# Patient Record
Sex: Male | Born: 1960 | Race: White | Hispanic: No | Smoking: Former smoker
Health system: Southern US, Community
[De-identification: ages and names within clinical notes are randomized; demographics above are authoritative.]

## PROBLEM LIST (undated history)

## (undated) DIAGNOSIS — J189 Pneumonia, unspecified organism: Secondary | ICD-10-CM

## (undated) DIAGNOSIS — I10 Essential (primary) hypertension: Secondary | ICD-10-CM

## (undated) DIAGNOSIS — M1990 Unspecified osteoarthritis, unspecified site: Secondary | ICD-10-CM

## (undated) DIAGNOSIS — J45909 Unspecified asthma, uncomplicated: Secondary | ICD-10-CM

## (undated) DIAGNOSIS — F32A Depression, unspecified: Secondary | ICD-10-CM

## (undated) DIAGNOSIS — C3491 Malignant neoplasm of unspecified part of right bronchus or lung: Secondary | ICD-10-CM

## (undated) DIAGNOSIS — M549 Dorsalgia, unspecified: Secondary | ICD-10-CM

## (undated) DIAGNOSIS — K219 Gastro-esophageal reflux disease without esophagitis: Secondary | ICD-10-CM

## (undated) DIAGNOSIS — I2699 Other pulmonary embolism without acute cor pulmonale: Secondary | ICD-10-CM

## (undated) DIAGNOSIS — Q0700 Arnold-Chiari syndrome without spina bifida or hydrocephalus: Secondary | ICD-10-CM

## (undated) DIAGNOSIS — R0600 Dyspnea, unspecified: Secondary | ICD-10-CM

## (undated) DIAGNOSIS — G8929 Other chronic pain: Secondary | ICD-10-CM

## (undated) DIAGNOSIS — M48061 Spinal stenosis, lumbar region without neurogenic claudication: Secondary | ICD-10-CM

## (undated) DIAGNOSIS — R062 Wheezing: Secondary | ICD-10-CM

## (undated) DIAGNOSIS — J302 Other seasonal allergic rhinitis: Secondary | ICD-10-CM

## (undated) DIAGNOSIS — F329 Major depressive disorder, single episode, unspecified: Secondary | ICD-10-CM

## (undated) DIAGNOSIS — J449 Chronic obstructive pulmonary disease, unspecified: Secondary | ICD-10-CM

## (undated) HISTORY — DX: Malignant neoplasm of unspecified part of right bronchus or lung: C34.91

## (undated) HISTORY — PX: BACK SURGERY: SHX140

---

## 2002-10-17 ENCOUNTER — Emergency Department (HOSPITAL_COMMUNITY): Admission: EM | Admit: 2002-10-17 | Discharge: 2002-10-17 | Payer: Self-pay | Admitting: Emergency Medicine

## 2004-04-29 ENCOUNTER — Emergency Department (HOSPITAL_COMMUNITY): Admission: EM | Admit: 2004-04-29 | Discharge: 2004-04-29 | Payer: Self-pay | Admitting: Emergency Medicine

## 2007-08-06 ENCOUNTER — Emergency Department (HOSPITAL_COMMUNITY): Admission: EM | Admit: 2007-08-06 | Discharge: 2007-08-06 | Payer: Self-pay | Admitting: Emergency Medicine

## 2008-07-26 ENCOUNTER — Emergency Department (HOSPITAL_COMMUNITY): Admission: EM | Admit: 2008-07-26 | Discharge: 2008-07-26 | Payer: Self-pay | Admitting: Emergency Medicine

## 2008-08-02 ENCOUNTER — Emergency Department (HOSPITAL_COMMUNITY): Admission: EM | Admit: 2008-08-02 | Discharge: 2008-08-02 | Payer: Self-pay | Admitting: Emergency Medicine

## 2008-09-02 ENCOUNTER — Ambulatory Visit: Payer: Self-pay | Admitting: Pain Medicine

## 2008-09-11 ENCOUNTER — Ambulatory Visit: Payer: Self-pay | Admitting: Pain Medicine

## 2008-09-12 ENCOUNTER — Other Ambulatory Visit: Payer: Self-pay

## 2008-09-12 ENCOUNTER — Ambulatory Visit: Payer: Self-pay | Admitting: Pain Medicine

## 2008-11-04 ENCOUNTER — Ambulatory Visit: Payer: Self-pay | Admitting: Pain Medicine

## 2008-11-19 ENCOUNTER — Ambulatory Visit: Payer: Self-pay | Admitting: Pain Medicine

## 2008-12-27 DIAGNOSIS — F411 Generalized anxiety disorder: Secondary | ICD-10-CM

## 2008-12-27 DIAGNOSIS — J45909 Unspecified asthma, uncomplicated: Secondary | ICD-10-CM | POA: Insufficient documentation

## 2008-12-27 DIAGNOSIS — M1990 Unspecified osteoarthritis, unspecified site: Secondary | ICD-10-CM | POA: Insufficient documentation

## 2008-12-27 DIAGNOSIS — M545 Low back pain: Secondary | ICD-10-CM

## 2008-12-27 DIAGNOSIS — F418 Other specified anxiety disorders: Secondary | ICD-10-CM

## 2008-12-30 ENCOUNTER — Ambulatory Visit: Payer: Self-pay | Admitting: Internal Medicine

## 2009-01-13 ENCOUNTER — Ambulatory Visit: Payer: Self-pay | Admitting: Internal Medicine

## 2009-01-13 DIAGNOSIS — L570 Actinic keratosis: Secondary | ICD-10-CM

## 2009-01-13 DIAGNOSIS — K219 Gastro-esophageal reflux disease without esophagitis: Secondary | ICD-10-CM

## 2009-02-24 ENCOUNTER — Ambulatory Visit: Payer: Self-pay | Admitting: Internal Medicine

## 2009-04-07 ENCOUNTER — Ambulatory Visit: Payer: Self-pay | Admitting: Internal Medicine

## 2009-04-08 ENCOUNTER — Encounter (INDEPENDENT_AMBULATORY_CARE_PROVIDER_SITE_OTHER): Payer: Self-pay | Admitting: Internal Medicine

## 2009-04-08 LAB — CONVERTED CEMR LAB
Amphetamine Screen, Ur: NEGATIVE
Barbiturate Quant, Ur: NEGATIVE
Creatinine,U: 123.7 mg/dL
Opiate Screen, Urine: NEGATIVE
Phencyclidine (PCP): NEGATIVE

## 2009-05-06 ENCOUNTER — Ambulatory Visit: Payer: Self-pay | Admitting: Internal Medicine

## 2009-05-07 ENCOUNTER — Encounter (INDEPENDENT_AMBULATORY_CARE_PROVIDER_SITE_OTHER): Payer: Self-pay | Admitting: Internal Medicine

## 2009-05-07 LAB — CONVERTED CEMR LAB
ALT: 19 units/L (ref 0–53)
AST: 17 units/L (ref 0–37)
Albumin: 4.3 g/dL (ref 3.5–5.2)
Alkaline Phosphatase: 89 units/L (ref 39–117)
Amphetamine Screen, Ur: NEGATIVE
BUN: 15 mg/dL (ref 6–23)
CO2: 23 meq/L (ref 19–32)
Calcium: 9.6 mg/dL (ref 8.4–10.5)
Chloride: 106 meq/L (ref 96–112)
Cholesterol: 152 mg/dL (ref 0–200)
Cocaine Metabolites: NEGATIVE
Creatinine, Ser: 0.81 mg/dL (ref 0.40–1.50)
Creatinine,U: 158.3 mg/dL
Glucose, Bld: 120 mg/dL — ABNORMAL HIGH (ref 70–99)
HDL: 68 mg/dL (ref >39)
LDL Cholesterol: 72 mg/dL (ref 0–99)
Marijuana Metabolite: NEGATIVE
Methadone: POSITIVE — AB
Opiates: NEGATIVE
Phencyclidine (PCP): NEGATIVE
Potassium: 4.6 meq/L (ref 3.5–5.3)
Sodium: 140 meq/L (ref 135–145)
Total Bilirubin: 0.3 mg/dL (ref 0.3–1.2)
Total CHOL/HDL Ratio: 2.2
Total Protein: 7.3 g/dL (ref 6.0–8.3)
Triglycerides: 62 mg/dL (ref <150)
VLDL: 12 mg/dL (ref 0–40)

## 2009-05-16 ENCOUNTER — Encounter (INDEPENDENT_AMBULATORY_CARE_PROVIDER_SITE_OTHER): Payer: Self-pay | Admitting: Internal Medicine

## 2009-05-19 ENCOUNTER — Encounter (INDEPENDENT_AMBULATORY_CARE_PROVIDER_SITE_OTHER): Payer: Self-pay | Admitting: Internal Medicine

## 2009-05-26 ENCOUNTER — Telehealth (INDEPENDENT_AMBULATORY_CARE_PROVIDER_SITE_OTHER): Payer: Self-pay | Admitting: Internal Medicine

## 2009-06-11 ENCOUNTER — Encounter (INDEPENDENT_AMBULATORY_CARE_PROVIDER_SITE_OTHER): Payer: Self-pay | Admitting: Internal Medicine

## 2010-05-28 ENCOUNTER — Emergency Department (HOSPITAL_COMMUNITY): Admission: EM | Admit: 2010-05-28 | Discharge: 2010-05-28 | Payer: Self-pay | Admitting: Emergency Medicine

## 2011-04-11 ENCOUNTER — Emergency Department (HOSPITAL_COMMUNITY)
Admission: EM | Admit: 2011-04-11 | Discharge: 2011-04-11 | Disposition: A | Discharge status: Home / Self Care | Payer: Self-pay | Attending: Emergency Medicine | Admitting: Emergency Medicine

## 2011-04-11 DIAGNOSIS — G8929 Other chronic pain: Secondary | ICD-10-CM | POA: Insufficient documentation

## 2011-04-11 DIAGNOSIS — M549 Dorsalgia, unspecified: Secondary | ICD-10-CM | POA: Insufficient documentation

## 2011-06-06 ENCOUNTER — Emergency Department (HOSPITAL_COMMUNITY)
Admission: EM | Admit: 2011-06-06 | Discharge: 2011-06-06 | Disposition: A | Discharge status: Home / Self Care | Payer: Self-pay | Attending: Emergency Medicine | Admitting: Emergency Medicine

## 2011-06-06 DIAGNOSIS — M545 Low back pain, unspecified: Secondary | ICD-10-CM | POA: Insufficient documentation

## 2011-06-06 DIAGNOSIS — Z79899 Other long term (current) drug therapy: Secondary | ICD-10-CM | POA: Insufficient documentation

## 2011-06-06 DIAGNOSIS — Z981 Arthrodesis status: Secondary | ICD-10-CM | POA: Insufficient documentation

## 2011-06-06 DIAGNOSIS — F1210 Cannabis abuse, uncomplicated: Secondary | ICD-10-CM | POA: Insufficient documentation

## 2011-06-06 DIAGNOSIS — F17200 Nicotine dependence, unspecified, uncomplicated: Secondary | ICD-10-CM | POA: Insufficient documentation

## 2011-06-06 DIAGNOSIS — G8929 Other chronic pain: Secondary | ICD-10-CM | POA: Insufficient documentation

## 2011-09-25 ENCOUNTER — Encounter: Payer: Self-pay | Admitting: *Deleted

## 2011-09-25 ENCOUNTER — Emergency Department (HOSPITAL_COMMUNITY)
Admission: EM | Admit: 2011-09-25 | Discharge: 2011-09-25 | Disposition: A | Discharge status: Home / Self Care | Payer: Self-pay | Attending: Emergency Medicine | Admitting: Emergency Medicine

## 2011-09-25 DIAGNOSIS — M5410 Radiculopathy, site unspecified: Secondary | ICD-10-CM

## 2011-09-25 DIAGNOSIS — M48061 Spinal stenosis, lumbar region without neurogenic claudication: Secondary | ICD-10-CM | POA: Insufficient documentation

## 2011-09-25 DIAGNOSIS — Z9889 Other specified postprocedural states: Secondary | ICD-10-CM | POA: Insufficient documentation

## 2011-09-25 DIAGNOSIS — M545 Low back pain, unspecified: Secondary | ICD-10-CM | POA: Insufficient documentation

## 2011-09-25 DIAGNOSIS — IMO0002 Reserved for concepts with insufficient information to code with codable children: Secondary | ICD-10-CM | POA: Insufficient documentation

## 2011-09-25 DIAGNOSIS — F17200 Nicotine dependence, unspecified, uncomplicated: Secondary | ICD-10-CM | POA: Insufficient documentation

## 2011-09-25 DIAGNOSIS — M549 Dorsalgia, unspecified: Secondary | ICD-10-CM

## 2011-09-25 HISTORY — DX: Spinal stenosis, lumbar region without neurogenic claudication: M48.061

## 2011-09-25 MED ORDER — HYDROMORPHONE HCL 2 MG/ML IJ SOLN
2.0000 mg | Freq: Once | INTRAMUSCULAR | Status: AC
  Administered 2011-09-25: 2 mg via INTRAMUSCULAR
  Filled 2011-09-25: qty 1

## 2011-09-25 MED ORDER — PREDNISONE 50 MG PO TABS: 50.0000 mg | ORAL_TABLET | Freq: Every day | ORAL | Status: AC

## 2011-09-25 MED ORDER — OXYCODONE-ACETAMINOPHEN 5-325 MG PO TABS
2.0000 | ORAL_TABLET | Freq: Four times a day (QID) | ORAL | Status: AC | PRN
Start: 2011-09-25 — End: 2011-10-05

## 2011-09-25 NOTE — ED Notes (Signed)
Pt c/o pain to bilateral legs and lower back; pt states he has fentnyl patch with no relief

## 2011-09-25 NOTE — ED Provider Notes (Signed)
History     CSN: [id number] Arrival date & time: 09/25/2011  5:48 PM   First MD Initiated Contact with Patient 09/25/11 1752      Chief Complaint  Patient presents with  . Pain    legs and lower back    (Consider location/radiation/quality/duration/timing/severity/associated sxs/prior treatment) HPI..... patient has chronic low back pain with radiation to both legs left greater than right. He's been doing this for 20 years. Multiple back surgeries in the past. Has been on a fentanyl patch this seemed to lose its potency after approximately 24 hours. Is able to see his doctor on Tuesday. Pain is sharp and is constant. Severe in intensity. No obvious change in character  Past Medical History  Diagnosis Date  . Spinal stenosis of lumbar region     Past Surgical History  Procedure Date  . Back surgery     History reviewed. No pertinent family history.  History  Substance Use Topics  . Smoking status: Current Everyday Smoker  . Smokeless tobacco: Not on file  . Alcohol Use: Yes     2-3 beers a week      Review of Systems  All other systems reviewed and are negative.    Allergies  Tetracyclines & related  Home Medications   Current Outpatient Rx  Name Route Sig Dispense Refill  . OXYCODONE-ACETAMINOPHEN 5-325 MG PO TABS Oral Take 2 tablets by mouth every 6 (six) hours as needed for pain. 20 tablet 0  . PREDNISONE 50 MG PO TABS Oral Take 1 tablet (50 mg total) by mouth daily. 7 tablet 1    BP 185/99  Pulse 102  Temp 98.7 F (37.1 C)  Resp 18  Ht 6' (1.829 m)  Wt 213 lb (96.616 kg)  BMI 28.89 kg/m2  SpO2 99%  Physical Exam  Nursing note and vitals reviewed. Constitutional: He is oriented to person, place, and time. He appears well-developed and well-nourished.       Hypertensive  HENT:  Head: Normocephalic and atraumatic.  Eyes: Conjunctivae and EOM are normal. Pupils are equal, round, and reactive to light.  Neck: Normal range of motion. Neck supple.    Cardiovascular: Normal rate and regular rhythm.   Pulmonary/Chest: Effort normal and breath sounds normal.  Abdominal: Soft. Bowel sounds are normal.  Musculoskeletal:       Generalized tenderness in lower back. Pain with straight leg raise left greater than right  Neurological: He is alert and oriented to person, place, and time.  Skin: Skin is warm and dry.  Psychiatric: He has a normal mood and affect.    ED Course  Procedures (including critical care time)  Labs Reviewed - No data to display No results found.   1. Back pain   2. Radicular leg pain       MDM  This appears to be chronic pain. No bowel or bladder incontinence. Will prescribe intramuscular opiate in ED. Discharge home with Percocet. See primary care doctor on Tuesday        Donnetta Hutching, MD 09/25/11 [phone]

## 2011-09-25 NOTE — ED Notes (Signed)
Pt reports has had long history of chronic lower back pain.  Reports has been on fentanyl patches but not helping.  States pain radiates down both legs.

## 2012-02-05 ENCOUNTER — Emergency Department (HOSPITAL_COMMUNITY)
Admission: EM | Admit: 2012-02-05 | Discharge: 2012-02-05 | Disposition: A | Discharge status: Home / Self Care | Payer: Self-pay | Attending: Emergency Medicine | Admitting: Emergency Medicine

## 2012-02-05 ENCOUNTER — Encounter (HOSPITAL_COMMUNITY): Payer: Self-pay | Admitting: *Deleted

## 2012-02-05 DIAGNOSIS — M79609 Pain in unspecified limb: Secondary | ICD-10-CM | POA: Insufficient documentation

## 2012-02-05 DIAGNOSIS — F17200 Nicotine dependence, unspecified, uncomplicated: Secondary | ICD-10-CM | POA: Insufficient documentation

## 2012-02-05 DIAGNOSIS — M545 Low back pain, unspecified: Secondary | ICD-10-CM | POA: Insufficient documentation

## 2012-02-05 DIAGNOSIS — G8929 Other chronic pain: Secondary | ICD-10-CM | POA: Insufficient documentation

## 2012-02-05 DIAGNOSIS — M549 Dorsalgia, unspecified: Secondary | ICD-10-CM

## 2012-02-05 HISTORY — DX: Dorsalgia, unspecified: M54.9

## 2012-02-05 HISTORY — DX: Other chronic pain: G89.29

## 2012-02-05 MED ORDER — OXYCODONE-ACETAMINOPHEN 5-325 MG PO TABS: 2.0000 | ORAL_TABLET | Freq: Four times a day (QID) | ORAL | Status: AC | PRN

## 2012-02-05 MED ORDER — HYDROMORPHONE HCL PF 2 MG/ML IJ SOLN
2.0000 mg | Freq: Once | INTRAMUSCULAR | Status: AC
  Administered 2012-02-05: 2 mg via INTRAMUSCULAR
  Filled 2012-02-05: qty 1

## 2012-02-05 MED ORDER — OXYCODONE-ACETAMINOPHEN 5-500 MG PO CAPS: 1.0000 | ORAL_CAPSULE | ORAL | Status: DC | PRN

## 2012-02-05 NOTE — ED Notes (Signed)
Pt states he has been without his Fentanyl patch x 3 days due to not being able to afford it. States he has an appt with PMD on Tuesday. States lower back pain radiating down both legs.

## 2012-02-05 NOTE — Discharge Instructions (Signed)
Follow up as planned next week °

## 2012-02-05 NOTE — ED Provider Notes (Signed)
History    Scribed for Benny Lennert, MD, the patient was seen in room APA08/APA08. This chart was scribed by Katha Cabal.  Pt was seen at 6:42 PM    CSN: [id number]  Arrival date & time 02/05/12  1734   First MD Initiated Contact with Patient 02/05/12 1840      Chief Complaint  Patient presents with  . Back Pain    (Consider location/radiation/quality/duration/timing/severity/associated sxs/prior treatment) Patient is a 51 y.o. male presenting with back pain.  Back Pain  This is a chronic problem. The current episode started more than 2 days ago. The problem occurs constantly. The problem has not changed since onset.Associated with: lack of Fentanyl patch. The pain is present in the lumbar spine. Radiates to: down bilateral legs. Pain severity now: moderate to severe  The pain is the same all the time. Associated symptoms include leg pain. Pertinent negatives include no chest pain, no headaches, no abdominal pain, no bowel incontinence and no bladder incontinence.   Patient ran out of Fentanyl pain medication 3 days ago.  Patient has history of spinal stenosis.  Patient has appt.early next week with PMD.  Patient uses Fentanyl patch.  Patient reports pain in bilateral legs and lower back pain.  Pain does not increase with movement.  Pain on top and sides of legs.   Patient states he is out of work and can't afford pain medication and is not in a pain clinic.     Past Medical History  Diagnosis Date  . Spinal stenosis of lumbar region   . Chronic back pain     Past Surgical History  Procedure Date  . Back surgery     No family history on file.  History  Substance Use Topics  . Smoking status: Current Everyday Smoker  . Smokeless tobacco: Not on file  . Alcohol Use: Yes     2-3 beers a week      Review of Systems  Constitutional: Negative for fatigue.  HENT: Negative for congestion, sinus pressure and ear discharge.   Eyes: Negative for discharge.  Respiratory:  Negative for cough.   Cardiovascular: Negative for chest pain.  Gastrointestinal: Negative for abdominal pain, diarrhea and bowel incontinence.  Genitourinary: Negative for bladder incontinence, frequency and hematuria.  Musculoskeletal: Positive for back pain.       Leg pain  Skin: Negative for rash.  Neurological: Negative for seizures and headaches.  Hematological: Negative.   Psychiatric/Behavioral: Negative for hallucinations.    Allergies  Tetracyclines & related  Home Medications   Current Outpatient Rx  Name Route Sig Dispense Refill  . FENTANYL 100 MCG/HR TD PT72 Transdermal Place 1 patch onto the skin 2 days. Patient states he took patch off 3 days ago      BP 147/112  Pulse 107  Temp(Src) 97.8 F (36.6 C) (Oral)  Resp 22  Ht 6' (1.829 m)  Wt 190 lb (86.183 kg)  BMI 25.77 kg/m2  SpO2 97%  Physical Exam  Constitutional: He is oriented to person, place, and time. He appears well-developed.  HENT:  Head: Normocephalic and atraumatic.  Eyes: Conjunctivae and EOM are normal. No scleral icterus.  Neck: Neck supple. No thyromegaly present.  Cardiovascular: Normal rate and regular rhythm.  Exam reveals no gallop and no friction rub.   No murmur heard. Pulmonary/Chest: No stridor. He has no wheezes. He has no rales. He exhibits no tenderness.  Abdominal: He exhibits no distension. There is no tenderness. There is no rebound.  Musculoskeletal: Normal range of motion. He exhibits no edema.       Lower lumbar spine tenderness, sensations of pain in bilateral legs but is able to move around  Lymphadenopathy:    He has no cervical adenopathy.  Neurological: He is oriented to person, place, and time. Coordination normal.  Skin: No rash noted. No erythema.  Psychiatric: He has a normal mood and affect. His behavior is normal.    ED Course  Procedures (including critical care time)   DIAGNOSTIC STUDIES: Oxygen Saturation is 97% on room air, normal by my  interpretation.     COORDINATION OF CARE: 6:45 PM  Physical exam complete.  Pain Control.  7:00 PM  Dilaudid ordered.   7:25 PM  Plan to discharge patient.  Patient agrees with plan.        LABS / RADIOLOGY:   Labs Reviewed - No data to display No results found.       MDM  Chronic back pain       MEDICATIONS GIVEN IN THE E.D. Scheduled Meds:    .  HYDROmorphone (DILAUDID) injection  2 mg Intramuscular Once   Continuous Infusions:      IMPRESSION: 1. Back pain      NEW MEDICATIONS: New Prescriptions   No medications on file      The chart was scribed for me under my direct supervision.  I personally performed the history, physical, and medical decision making and all procedures in the evaluation of this patient.Benny Lennert, MD 02/05/12 [phone]

## 2012-03-05 ENCOUNTER — Encounter (HOSPITAL_COMMUNITY): Payer: Self-pay | Admitting: *Deleted

## 2012-03-05 ENCOUNTER — Emergency Department (HOSPITAL_COMMUNITY)
Admission: EM | Admit: 2012-03-05 | Discharge: 2012-03-05 | Disposition: A | Discharge status: Home / Self Care | Payer: Self-pay | Attending: Emergency Medicine | Admitting: Emergency Medicine

## 2012-03-05 DIAGNOSIS — M5416 Radiculopathy, lumbar region: Secondary | ICD-10-CM

## 2012-03-05 DIAGNOSIS — Z888 Allergy status to other drugs, medicaments and biological substances status: Secondary | ICD-10-CM | POA: Insufficient documentation

## 2012-03-05 DIAGNOSIS — IMO0002 Reserved for concepts with insufficient information to code with codable children: Secondary | ICD-10-CM | POA: Insufficient documentation

## 2012-03-05 DIAGNOSIS — F17200 Nicotine dependence, unspecified, uncomplicated: Secondary | ICD-10-CM | POA: Insufficient documentation

## 2012-03-05 HISTORY — DX: Arnold-Chiari syndrome without spina bifida or hydrocephalus: Q07.00

## 2012-03-05 MED ORDER — PREDNISONE 20 MG PO TABS
60.0000 mg | ORAL_TABLET | Freq: Once | ORAL | Status: AC
  Administered 2012-03-05: 60 mg via ORAL
  Filled 2012-03-05: qty 3

## 2012-03-05 MED ORDER — PREDNISONE 10 MG PO TABS: ORAL_TABLET | ORAL | Status: DC

## 2012-03-05 MED ORDER — OXYCODONE-ACETAMINOPHEN 5-325 MG PO TABS: 1.0000 | ORAL_TABLET | ORAL | Status: AC | PRN

## 2012-03-05 MED ORDER — HYDROMORPHONE HCL PF 1 MG/ML IJ SOLN
1.0000 mg | Freq: Once | INTRAMUSCULAR | Status: AC
  Administered 2012-03-05: 1 mg via INTRAMUSCULAR
  Filled 2012-03-05: qty 1

## 2012-03-05 NOTE — Discharge Instructions (Signed)
Radicular Pain Radicular pain in either the arm or leg is usually from a bulging or herniated disk in the spine. A piece of the herniated disk may press against the nerves as the nerves exit the spine. This causes pain which is felt at the tips of the nerves down the arm or leg. Other causes of radicular pain may include:  Fractures.   Heart disease.   Cancer.   An abnormal and usually degenerative state of the nervous system or nerves (neuropathy).  Diagnosis may require CT or MRI scanning to determine the primary cause.  Nerves that start at the neck (nerve roots) may cause radicular pain in the outer shoulder and arm. It can spread down to the thumb and fingers. The symptoms vary depending on which nerve root has been affected. In most cases radicular pain improves with conservative treatment. Neck problems may require physical therapy, a neck collar, or cervical traction. Treatment may take many weeks, and surgery may be considered if the symptoms do not improve.  Conservative treatment is also recommended for sciatica. Sciatica causes pain to radiate from the lower back or buttock area down the leg into the foot. Often there is a history of back problems. Most patients with sciatica are better after 2 to 4 weeks of rest and other supportive care. Short term bed rest can reduce the disk pressure considerably. Sitting, however, is not a good position since this increases the pressure on the disk. You should avoid bending, lifting, and all other activities which make the problem worse. Traction can be used in severe cases. Surgery is usually reserved for patients who do not improve within the first months of treatment. Only take over-the-counter or prescription medicines for pain, discomfort, or fever as directed by your caregiver. Narcotics and muscle relaxants may help by relieving more severe pain and spasm and by providing mild sedation. Cold or massage can give significant relief. Spinal  manipulation is not recommended. It can increase the degree of disc protrusion. Epidural steroid injections are often effective treatment for radicular pain. These injections deliver medicine to the spinal nerve in the space between the protective covering of the spinal cord and back bones (vertebrae). Your caregiver can give you more information about steroid injections. These injections are most effective when given within two weeks of the onset of pain.  You should see your caregiver for follow up care as recommended. A program for neck and back injury rehabilitation with stretching and strengthening exercises is an important part of management.  SEEK IMMEDIATE MEDICAL CARE IF:  You develop increased pain, weakness, or numbness in your arm or leg.   You develop difficulty with bladder or bowel control.   You develop abdominal pain.  Document Released: 12/30/2004 Document Revised: 11/11/2011 Document Reviewed: 03/17/2009 [employer] Patient Information 2012 [employer]. 

## 2012-03-05 NOTE — ED Provider Notes (Signed)
History     CSN: [id number]  Arrival date & time 03/05/12  1004   First MD Initiated Contact with Patient 03/05/12 1023      Chief Complaint  Patient presents with  . Leg Pain    (Consider location/radiation/quality/duration/timing/severity/associated sxs/prior treatment) HPI Comments: Patient presents for pain management of his chronic low back pain.  He describes bilateral lower lumbar discomfort for which he  uses fentanyl patches prescribed by his PCP Dr. Megan Mans.  He has run out of these patches he also reports new tingling sensation in his right lateral lower thigh which has been present for the past week.  He denies any new injury, however he did have to help his mother off the floor last week when she took a fall.  Perhaps this flared up his chronic spinal stenosis.  He denies weakness in his lower extremities, no urinary or bowel incontinence or retention.  He is trying to get back in with chronic pain management, but states he has to get back on disability in order to make this happen.  Patient is a 51 y.o. male presenting with back pain. The history is provided by the patient.  Back Pain  This is a chronic problem. The pain is present in the lumbar spine. The quality of the pain is described as aching. The pain radiates to the right thigh. The pain is at a severity of 7/10. The pain is moderate. The symptoms are aggravated by bending, twisting and certain positions. The pain is the same all the time. Associated symptoms include leg pain and tingling. Pertinent negatives include no chest pain, no fever, no headaches, no abdominal pain, no abdominal swelling, no bowel incontinence, no perianal numbness, no bladder incontinence, no dysuria, no paresthesias, no paresis and no weakness.    Past Medical History  Diagnosis Date  . Spinal stenosis of lumbar region   . Chronic back pain   . Arnold-Chiari syndrome     Past Surgical History  Procedure Date  . Back surgery     No  family history on file.  History  Substance Use Topics  . Smoking status: Current Everyday Smoker  . Smokeless tobacco: Not on file  . Alcohol Use: Yes     2-3 beers a week      Review of Systems  Constitutional: Negative for fever.  HENT: Negative for congestion, sore throat and neck pain.   Eyes: Negative.   Respiratory: Negative for chest tightness and shortness of breath.   Cardiovascular: Negative for chest pain.  Gastrointestinal: Negative for nausea, abdominal pain and bowel incontinence.  Genitourinary: Negative.  Negative for bladder incontinence and dysuria.  Musculoskeletal: Positive for back pain. Negative for joint swelling and arthralgias.  Skin: Negative.  Negative for rash and wound.  Neurological: Positive for tingling. Negative for dizziness, weakness, light-headedness, headaches and paresthesias.  Hematological: Negative.   Psychiatric/Behavioral: Negative.     Allergies  Tetracyclines & related  Home Medications   Current Outpatient Rx  Name Route Sig Dispense Refill  . FENTANYL 100 MCG/HR TD PT72 Transdermal Place 1 patch onto the skin 2 days. Patient states he took patch off 3 days ago    . OXYCODONE-ACETAMINOPHEN 5-325 MG PO TABS Oral Take 1 tablet by mouth every 4 (four) hours as needed for pain. 30 tablet 0  . PREDNISONE 10 MG PO TABS  6, 5, 4, 3, 2 then 1 tablet by mouth daily for 6 days total. 21 tablet 0    BP 141/96  Pulse 103  Temp(Src) 98.1 F (36.7 C) (Oral)  Resp 18  Ht 6' (1.829 m)  Wt 215 lb (97.523 kg)  BMI 29.16 kg/m2  SpO2 98%  Physical Exam  Nursing note and vitals reviewed. Constitutional: He is oriented to person, place, and time. He appears well-developed and well-nourished. No distress.  HENT:  Head: Normocephalic and atraumatic.  Mouth/Throat: Oropharynx is clear and moist.  Eyes: Conjunctivae are normal.  Neck: Normal range of motion. Neck supple.  Cardiovascular: Normal rate and intact distal pulses.        Pedal  pulses normal.  Pulmonary/Chest: Effort normal.  Abdominal: Soft. He exhibits no distension.  Musculoskeletal: He exhibits tenderness. He exhibits no edema.       Lumbar back: He exhibits tenderness. He exhibits no swelling, no edema, no spasm and normal pulse.       Pain with straight leg raise right leg.  Lymphadenopathy:    He has no cervical adenopathy.  Neurological: He is alert and oriented to person, place, and time. He has normal strength. He displays no atrophy and no tremor. No cranial nerve deficit or sensory deficit. Gait normal.  Reflex Scores:      Patellar reflexes are 2+ on the right side and 2+ on the left side.      Achilles reflexes are 2+ on the right side and 2+ on the left side.      No strength deficit noted in hip and knee flexor and extensor muscle groups.  Ankle flexion and extension intact.  Skin: Skin is warm and dry. No erythema.  Psychiatric: He has a normal mood and affect.    ED Course  Procedures (including critical care time)  Labs Reviewed - No data to display No results found.   1. Lumbar radiculopathy       MDM  Acute on chronic low back pain with radiculopathy.  Patient has seen Dr. Danielle Dess in the past, performed his last 3 surgeries.  Encouraged followup with him.  Will put patient on prednisone taper and prescribe Percocet for short-term pain relief.  Also occurred just to follow up with his PCP for further pain management.        Candis Musa, PA 03/05/12 1050

## 2012-03-05 NOTE — ED Notes (Signed)
Pt a/ox4. Resp even and unlabored. NAD at this time. D/C instructions reviewed with pt. Pt verbalized understanding. Pt ambulated to lobby with steady gate.  

## 2012-03-05 NOTE — ED Notes (Signed)
Pt c/o pain and numbness in his right leg. States that he has chronic pain and that he is out of his Fentanyl patches. States that he is supposed to go to Dr. Renard Matter on Wednesday.

## 2012-03-05 NOTE — ED Notes (Signed)
bil leg pain since last night with numbness to right leg.  Reports takes fentanyl patches but ran out.

## 2012-03-06 ENCOUNTER — Emergency Department (HOSPITAL_COMMUNITY)
Admission: EM | Admit: 2012-03-06 | Discharge: 2012-03-06 | Disposition: A | Discharge status: Home / Self Care | Payer: Self-pay | Attending: Emergency Medicine | Admitting: Emergency Medicine

## 2012-03-06 ENCOUNTER — Encounter (HOSPITAL_COMMUNITY): Payer: Self-pay | Admitting: Emergency Medicine

## 2012-03-06 DIAGNOSIS — G8929 Other chronic pain: Secondary | ICD-10-CM | POA: Insufficient documentation

## 2012-03-06 DIAGNOSIS — M549 Dorsalgia, unspecified: Secondary | ICD-10-CM

## 2012-03-06 DIAGNOSIS — F17200 Nicotine dependence, unspecified, uncomplicated: Secondary | ICD-10-CM | POA: Insufficient documentation

## 2012-03-06 DIAGNOSIS — M48061 Spinal stenosis, lumbar region without neurogenic claudication: Secondary | ICD-10-CM | POA: Insufficient documentation

## 2012-03-06 DIAGNOSIS — Z883 Allergy status to other anti-infective agents status: Secondary | ICD-10-CM | POA: Insufficient documentation

## 2012-03-06 MED ORDER — HYDROMORPHONE HCL 2 MG PO TABS: 2.0000 mg | ORAL_TABLET | ORAL | Status: AC | PRN

## 2012-03-06 MED ORDER — HYDROMORPHONE HCL PF 2 MG/ML IJ SOLN
2.0000 mg | Freq: Once | INTRAMUSCULAR | Status: AC
  Administered 2012-03-06: 2 mg via INTRAMUSCULAR
  Filled 2012-03-06: qty 1

## 2012-03-06 NOTE — ED Notes (Signed)
Patient complaining of chronic right leg pain. States pain won't go away this time. Dr. Ignacia Palma just saw patient. Patient requesting pain medication.

## 2012-03-06 NOTE — Discharge Instructions (Signed)
Continue to take the prednisone prescribed yesterday.  Take Dilaudid 2 mg every 4 hours if needed for pain.  Call Dr. Renard Matter' office for followup.

## 2012-03-06 NOTE — ED Notes (Signed)
Pt c/o chronic r leg pain to anterior thigh and lateral lower leg. States has a lot of back problems. Could not get rid of the pain this time. Nad. Pt has been massaging leg. Nad. State has no feeling to anterior and lateral part of thigh. Pt was unable to feel my touch to anterior and lateral part of leg.

## 2012-03-06 NOTE — ED Provider Notes (Signed)
History     CSN: [id number]  Arrival date & time 03/06/12  1441   First MD Initiated Contact with Patient 03/06/12 1913      Chief Complaint  Patient presents with  . Leg Pain    (Consider location/radiation/quality/duration/timing/severity/associated sxs/prior treatment) Patient is a 51 y.o. male presenting with back pain. The history is provided by the patient and medical records. No language interpreter was used.  Back Pain  This is a chronic problem. The current episode started 2 days ago. The problem occurs constantly. The problem has been gradually worsening. Associated with: 3 prior back surgeries. He has chronic back pain, for which he is followed by Dr. Butch Penny. The pain is present in the lumbar spine. The quality of the pain is described as stabbing. The pain radiates to the right thigh. The pain is at a severity of 10/10. The pain is severe. The symptoms are aggravated by bending and twisting. Associated symptoms include leg pain. Pertinent negatives include no fever. Treatments tried: Percocet and prednisone taper, prescribed in [facility] ED yesterday. Risk factors: Prior back surgery.    Past Medical History  Diagnosis Date  . Spinal stenosis of lumbar region   . Chronic back pain   . Arnold-Chiari syndrome     Past Surgical History  Procedure Date  . Back surgery     History reviewed. No pertinent family history.  History  Substance Use Topics  . Smoking status: Current Everyday Smoker  . Smokeless tobacco: Not on file  . Alcohol Use: Yes     2-3 beers a week      Review of Systems  Constitutional: Negative.  Negative for fever and chills.  HENT: Negative.   Eyes: Negative.   Respiratory: Negative.   Cardiovascular: Negative.   Genitourinary: Negative.   Musculoskeletal: Positive for back pain.  Skin: Negative.   Psychiatric/Behavioral: Negative.     Allergies  Tetracyclines & related  Home Medications   Current Outpatient Rx  Name  Route Sig Dispense Refill  . FENTANYL 100 MCG/HR TD PT72 Transdermal Place 1 patch onto the skin 2 days. Patient states he took patch off 3 days ago    . OXYCODONE-ACETAMINOPHEN 5-325 MG PO TABS Oral Take 1 tablet by mouth every 4 (four) hours as needed for pain. 30 tablet 0  . PREDNISONE 10 MG PO TABS  6, 5, 4, 3, 2 then 1 tablet by mouth daily for 6 days total. 21 tablet 0  . SERTRALINE HCL 50 MG PO TABS Oral Take 50 mg by mouth daily.      BP 152/80  Pulse 83  Temp(Src) 98.2 F (36.8 C) (Oral)  Resp 20  Ht 6' (1.829 m)  Wt 215 lb (97.523 kg)  BMI 29.16 kg/m2  SpO2 96%  Physical Exam  Nursing note and vitals reviewed. Constitutional: He is oriented to person, place, and time. He appears well-developed and well-nourished.       In acute distress with low back pain.  HENT:  Head: Normocephalic and atraumatic.  Eyes: Conjunctivae and EOM are normal. Pupils are equal, round, and reactive to light.  Neck: Normal range of motion. Neck supple.  Cardiovascular: Normal rate and regular rhythm.   Pulmonary/Chest: Effort normal and breath sounds normal.  Abdominal: Soft.  Musculoskeletal:       Well healed lumbar surgical scars.  No bony deformity or point tenderness.  Neurological: He is alert and oriented to person, place, and time.  Localizes area of numbness to right thigh laterally.  No other numbness or weakness.   Skin: Skin is warm and dry.  Psychiatric: He has a normal mood and affect. His behavior is normal.    ED Course  Procedures (including critical care time)  7:40 PM Previously prescribed Percocet is not controlling his pain.  Rx with IM Dilaudid, Rx for Dilaudid 2 mg q4h prn pain.  Pt to followup with Dr. Renard Matter for his pain management, potential referrals for further evaluations.  1. Chronic back pain           Carleene Cooper III, MD 03/06/12 1944

## 2012-03-06 NOTE — ED Provider Notes (Signed)
Medical screening examination/treatment/procedure(s) were performed by non-physician practitioner and as supervising physician I was immediately available for consultation/collaboration.   Shelda Jakes, MD 03/06/12 1048

## 2012-03-16 ENCOUNTER — Other Ambulatory Visit (HOSPITAL_COMMUNITY): Payer: Self-pay | Admitting: Family Medicine

## 2012-03-17 ENCOUNTER — Other Ambulatory Visit (HOSPITAL_COMMUNITY): Payer: Self-pay | Admitting: Family Medicine

## 2012-03-17 DIAGNOSIS — M545 Low back pain: Secondary | ICD-10-CM

## 2012-03-21 ENCOUNTER — Ambulatory Visit (HOSPITAL_COMMUNITY)
Admission: RE | Admit: 2012-03-21 | Discharge: 2012-03-21 | Disposition: A | Discharge status: Home / Self Care | Payer: Self-pay | Source: Ambulatory Visit | Attending: Family Medicine | Admitting: Family Medicine

## 2012-03-21 DIAGNOSIS — M545 Low back pain, unspecified: Secondary | ICD-10-CM | POA: Insufficient documentation

## 2012-03-21 DIAGNOSIS — M79609 Pain in unspecified limb: Secondary | ICD-10-CM | POA: Insufficient documentation

## 2012-03-21 DIAGNOSIS — M5126 Other intervertebral disc displacement, lumbar region: Secondary | ICD-10-CM | POA: Insufficient documentation

## 2012-03-21 MED ORDER — GADOBENATE DIMEGLUMINE 529 MG/ML IV SOLN
20.0000 mL | Freq: Once | INTRAVENOUS | Status: AC | PRN
  Administered 2012-03-21: 20 mL via INTRAVENOUS

## 2012-04-05 ENCOUNTER — Encounter (HOSPITAL_COMMUNITY): Payer: Self-pay | Admitting: *Deleted

## 2012-04-05 ENCOUNTER — Emergency Department (HOSPITAL_COMMUNITY)
Admission: EM | Admit: 2012-04-05 | Discharge: 2012-04-05 | Disposition: A | Discharge status: Home / Self Care | Payer: Self-pay | Attending: Emergency Medicine | Admitting: Emergency Medicine

## 2012-04-05 DIAGNOSIS — M545 Low back pain, unspecified: Secondary | ICD-10-CM | POA: Insufficient documentation

## 2012-04-05 DIAGNOSIS — R209 Unspecified disturbances of skin sensation: Secondary | ICD-10-CM | POA: Insufficient documentation

## 2012-04-05 DIAGNOSIS — G8929 Other chronic pain: Secondary | ICD-10-CM | POA: Insufficient documentation

## 2012-04-05 MED ORDER — ONDANSETRON 8 MG PO TBDP
8.0000 mg | ORAL_TABLET | Freq: Once | ORAL | Status: AC
  Administered 2012-04-05: 8 mg via ORAL
  Filled 2012-04-05: qty 1

## 2012-04-05 MED ORDER — HYDROMORPHONE HCL PF 2 MG/ML IJ SOLN
2.0000 mg | Freq: Once | INTRAMUSCULAR | Status: AC
  Administered 2012-04-05: 2 mg via INTRAMUSCULAR
  Filled 2012-04-05: qty 1

## 2012-04-05 NOTE — ED Provider Notes (Signed)
History     CSN: [id number]  Arrival date & time 04/05/12  4098   First MD Initiated Contact with Patient 04/05/12 1801      Chief Complaint  Patient presents with  . Back Pain    (Consider location/radiation/quality/duration/timing/severity/associated sxs/prior treatment) HPI Comments: Patient c/o chronic pain to his lower back " for a long time".  He states that he gets pain patches from his pmd but cannot afford to get all that is prescribed at one time.  He also states that he has appt soon to see a neurosurgeon.  He c/o numbness and tingling sensation to his legs but states the sx's are chronic.  He denies incontinence of urine and feces, perineal numbness, or weakness  Patient is a 51 y.o. male presenting with back pain. The history is provided by the patient.  Back Pain  This is a chronic problem. The current episode started more than 1 week ago. The problem occurs constantly. The problem has not changed since onset.The pain is associated with no known injury. The pain is present in the lumbar spine. The pain radiates to the left thigh and right thigh. The pain is moderate. The symptoms are aggravated by bending, certain positions and twisting. The pain is the same all the time. Associated symptoms include numbness, leg pain, paresthesias and tingling. Pertinent negatives include no fever, no abdominal pain, no abdominal swelling, no bowel incontinence, no perianal numbness, no bladder incontinence, no dysuria, no pelvic pain and no weakness. Treatments tried: topical pain patches. The treatment provided no relief.    Past Medical History  Diagnosis Date  . Spinal stenosis of lumbar region   . Chronic back pain   . Arnold-Chiari syndrome     Past Surgical History  Procedure Date  . Back surgery     History reviewed. No pertinent family history.  History  Substance Use Topics  . Smoking status: Current Everyday Smoker  . Smokeless tobacco: Not on file  . Alcohol Use: Yes       2-3 beers a week      Review of Systems  Constitutional: Negative for fever.  Respiratory: Negative for shortness of breath.   Gastrointestinal: Negative for vomiting, abdominal pain, constipation and bowel incontinence.  Genitourinary: Negative for bladder incontinence, dysuria, hematuria, flank pain, decreased urine volume, difficulty urinating, testicular pain and pelvic pain.       Perineal numbness or incontinence of urine or feces  Musculoskeletal: Positive for back pain. Negative for joint swelling.  Skin: Negative for rash.  Neurological: Positive for tingling, numbness and paresthesias. Negative for weakness.  All other systems reviewed and are negative.    Allergies  Tetracyclines & related  Home Medications   Current Outpatient Rx  Name Route Sig Dispense Refill  . ALBUTEROL SULFATE HFA 108 (90 BASE) MCG/ACT IN AERS Inhalation Inhale 2 puffs into the lungs every 6 (six) hours as needed.    . FENTANYL 100 MCG/HR TD PT72 Transdermal Place 1 patch onto the skin 2 days. Patient states he took patch off 3 days ago    . HYDROCODONE-ACETAMINOPHEN 10-325 MG PO TABS Oral Take 1 tablet by mouth every 6 (six) hours as needed. For pain    . SERTRALINE HCL 100 MG PO TABS Oral Take 100 mg by mouth daily.      BP 148/105  Pulse 89  Temp(Src) 97.9 F (36.6 C) (Oral)  Resp 20  Ht 6' (1.829 m)  Wt 215 lb (97.523 kg)  BMI 29.16  kg/m2  SpO2 99%  Physical Exam  Nursing note and vitals reviewed. Constitutional: He is oriented to person, place, and time. He appears well-developed and well-nourished. No distress.  HENT:  Head: Normocephalic and atraumatic.  Mouth/Throat: Oropharynx is clear and moist.  Cardiovascular: Normal rate, regular rhythm, normal heart sounds and intact distal pulses.   Pulmonary/Chest: Effort normal and breath sounds normal. No respiratory distress. He exhibits no tenderness.  Abdominal: Soft. He exhibits no distension. There is no tenderness.   Musculoskeletal: He exhibits tenderness. He exhibits no edema.       Lumbar back: He exhibits decreased range of motion, tenderness and bony tenderness. He exhibits no swelling, no edema, no deformity, no laceration and normal pulse.  Neurological: He is alert and oriented to person, place, and time. No cranial nerve deficit or sensory deficit. He exhibits normal muscle tone. Coordination normal.  Reflex Scores:      Patellar reflexes are 2+ on the right side and 2+ on the left side.      Achilles reflexes are 2+ on the right side and 2+ on the left side. Skin: Skin is warm and dry.    ED Course  Procedures (including critical care time)   Pt had MRI of L spine on 03/17/12 showing:  1. L3-4 soft disc protrusion into the right lateral recess affecting the right L4 nerve.  2. L5-S1 right facet joint inflammation and hypertrophy affecting  the right L5 nerve.     MDM    Previous medical charts, nursing notes and vitals signs from this visit were reviewed by me   All laboratory results and/or imaging results performed on this visit, if applicable, were reviewed by me and discussed with the patient and/or parent as well as recommendation for follow-up    MEDICATIONS GIVEN IN ED:  IM dilaudid  Patient with chronic low back pain and multiple ED visits for same.  He has ttp of the lumbar spine and paraspinal muscles.  No focal neuro deficits on exam.  Ambulates with a cane.  I have reviewed the patient on the [place] narcotics database and he was prescribed fentanyl patches on 03/30/12 (10 day supply) plus several other narcotics during the month of April.  I have advised the pt that ER cannot manage chronic pain and that he will need to f/u with his PMD.  He verbalized understanding and agreed to the care plan     PRESCRIPTIONS GIVEN AT DISCHARGE:  none     Pt stable in ED with no significant deterioration in condition. Pt feels improved after observation and/or treatment in ED.  Patient / Family / Caregiver understand and agree with initial ED impression and plan with expectations set for ED visit.  Patient agrees to return to ED for any worsening symptoms        Mykel Mohl L. [place], [place] 04/09/12 2123

## 2012-04-05 NOTE — Discharge Instructions (Signed)
Chronic Back Pain When back pain lasts longer than 3 months, it is called chronic back pain.This pain can be frustrating, but the cause of the pain is rarely dangerous.People with chronic back pain often go through certain periods that are more intense (flare-ups). CAUSES Chronic back pain can be caused by wear and tear (degeneration) on different structures in your back. These structures may include bones, ligaments, or discs. This degeneration may result in more pressure being placed on the nerves that travel to your legs and feet. This can lead to pain traveling from the low back down the back of the legs. When pain lasts longer than 3 months, it is not unusual for people to experience anxiety or depression. Anxiety and depression can also contribute to low back pain. TREATMENT  Establish a regular exercise plan. This is critical to improving your functional level.   Have a self-management plan for when you flare-up. Flare-ups rarely require a medical visit. Regular exercise will help reduce the intensity and frequency of your flare-ups.   Manage how you feel about your back pain and the rest of your life. Anxiety, depression, and feeling that you cannot alter your back pain have been shown to make back pain more intense and debilitating.   Medicines should never be your only treatment. They should be used along with other treatments to help you return to a more active lifestyle.   Procedures such as injections or surgery may be helpful but are rarely necessary. You may be able to get the same results with physical therapy or chiropractic care.  HOME CARE INSTRUCTIONS  Avoid bending, heavy lifting, prolonged sitting, and activities which make the problem worse.   Continue normal activity as much as possible.   Take brief periods of rest throughout the day to reduce your pain during flare-ups.   Follow your back exercise rehabilitation program. This can help reduce symptoms and prevent  more pain.   Only take over-the-counter or prescription medicines as directed by your caregiver. Muscle relaxants are sometimes prescribed. Narcotic pain medicine is discouraged for long-term pain, since addiction is a possible outcome.   If you smoke, quit.   Eat healthy foods and maintain a recommended body weight.  SEEK IMMEDIATE MEDICAL CARE IF:   You have weakness or numbness in one of your legs or feet.   You have trouble controlling your bladder or bowels.   You develop nausea, vomiting, abdominal pain, shortness of breath, or fainting.  Document Released: 12/30/2004 Document Revised: 11/11/2011 Document Reviewed: 11/06/2011 ExitCare Patient Information 2012 [employer].Chronic Pain Management Managing chronic pain is not easy. The goal is to provide as much pain relief as possible. There are emotional as well as physical problems. Chronic pain may lead to symptoms of depression which magnify those of the pain. Problems may include:  Anxiety.   Sleep disturbances.   Confused thinking.   Feeling cranky.   Fatigue.   Weight gain or loss.  Identify the source of the pain first, if possible. The pain may be masking another problem. Try to find a pain management specialist or clinic. Work with a team to create a treatment plan for you. MEDICATIONS  May include narcotics or opioids. Larger than normal doses may be needed to control your pain.   Drugs for depression may help.   Over-the-counter medicines may help for some conditions. These drugs may be used along with others for better pain relief.   May be injected into sites such as the spine   and joints. Injections may have to be repeated if they wear off.  THERAPY MAY INCLUDE:  Working with a physical therapist to keep from getting stiff.   Regular, gentle exercise.   Cognitive or behavioral therapy.   Using complementary or integrative medicine such as:   Acupuncture.   Massage, Reiki, or Rolfing.    Aroma, color, light, or sound therapy.   Group support.  FOR MORE INFORMATION http://www.painfoundation.org. American Chronic Pain Association http://www.thealpa.org. Document Released: 12/30/2004 Document Revised: 11/11/2011 Document Reviewed: 02/08/2008 ExitCare Patient Information 2012 [employer]. 

## 2012-04-05 NOTE — ED Notes (Signed)
Bil leg pain , numbness, tingling.  Has appt with Dr Channing Mutters soon.

## 2012-04-09 NOTE — ED Provider Notes (Signed)
Medical screening examination/treatment/procedure(s) were performed by non-physician practitioner and as supervising physician I was immediately available for consultation/collaboration.   Saleh Ulbrich L Chrstopher Malenfant, MD 04/09/12 2200 

## 2012-09-27 ENCOUNTER — Encounter (HOSPITAL_COMMUNITY): Payer: Self-pay | Admitting: *Deleted

## 2012-09-27 ENCOUNTER — Emergency Department (HOSPITAL_COMMUNITY)
Admission: EM | Admit: 2012-09-27 | Discharge: 2012-09-27 | Disposition: A | Discharge status: Home / Self Care | Payer: Self-pay | Attending: Emergency Medicine | Admitting: Emergency Medicine

## 2012-09-27 DIAGNOSIS — M129 Arthropathy, unspecified: Secondary | ICD-10-CM | POA: Insufficient documentation

## 2012-09-27 DIAGNOSIS — M48061 Spinal stenosis, lumbar region without neurogenic claudication: Secondary | ICD-10-CM | POA: Insufficient documentation

## 2012-09-27 DIAGNOSIS — Z8669 Personal history of other diseases of the nervous system and sense organs: Secondary | ICD-10-CM | POA: Insufficient documentation

## 2012-09-27 DIAGNOSIS — G8929 Other chronic pain: Secondary | ICD-10-CM | POA: Insufficient documentation

## 2012-09-27 DIAGNOSIS — M5416 Radiculopathy, lumbar region: Secondary | ICD-10-CM

## 2012-09-27 DIAGNOSIS — IMO0002 Reserved for concepts with insufficient information to code with codable children: Secondary | ICD-10-CM | POA: Insufficient documentation

## 2012-09-27 DIAGNOSIS — F17200 Nicotine dependence, unspecified, uncomplicated: Secondary | ICD-10-CM | POA: Insufficient documentation

## 2012-09-27 DIAGNOSIS — Z79899 Other long term (current) drug therapy: Secondary | ICD-10-CM | POA: Insufficient documentation

## 2012-09-27 HISTORY — DX: Unspecified osteoarthritis, unspecified site: M19.90

## 2012-09-27 MED ORDER — OXYCODONE-ACETAMINOPHEN 10-325 MG PO TABS: 1.0000 | ORAL_TABLET | ORAL | Status: DC | PRN

## 2012-09-27 MED ORDER — HYDROMORPHONE HCL PF 2 MG/ML IJ SOLN
2.0000 mg | Freq: Once | INTRAMUSCULAR | Status: AC
  Administered 2012-09-27: 2 mg via INTRAMUSCULAR
  Filled 2012-09-27: qty 1

## 2012-09-27 NOTE — ED Provider Notes (Signed)
Medical screening examination/treatment/procedure(s) were performed by non-physician practitioner and as supervising physician I was immediately available for consultation/collaboration.   Laray Anger, DO 09/27/12 2028

## 2012-09-27 NOTE — ED Provider Notes (Signed)
History     CSN: [id number]  Arrival date & time 09/27/12  1218   First MD Initiated Contact with Patient 09/27/12 1229      Chief Complaint  Patient presents with  . Back Pain    (Consider location/radiation/quality/duration/timing/severity/associated sxs/prior treatment) HPI Comments: Erik [name]   presents with acute on chronic low back pain which has which has been present for the past 2 days.   Patient denies any new injury specifically.  There is radiation into his right  lower thigh.  There has been no new weakness or numbness in the lower extremities, but does have chronic right lateral thigh numbness, and no urinary or bowel retention or incontinence.  Patient does not have a history of cancer or IVDU.  He does have known degenerative disk and joint disease in his lower and upper back.  He is being evaluated by Dr Channing Mutters currently and may require repeat lumbar surgery.  He has run out of his fentanyl patches,  Hence the increased pain today. He denies any new symptoms.   The history is provided by the patient.    Past Medical History  Diagnosis Date  . Spinal stenosis of lumbar region   . Chronic back pain   . Arnold-Chiari syndrome   . Arthritis     Past Surgical History  Procedure Date  . Back surgery     History reviewed. No pertinent family history.  History  Substance Use Topics  . Smoking status: Current Every Day Smoker  . Smokeless tobacco: Not on file  . Alcohol Use: Yes     2-3 beers a week      Review of Systems  Constitutional: Negative for fever.  Respiratory: Negative for shortness of breath.   Cardiovascular: Negative for chest pain and leg swelling.  Gastrointestinal: Negative for abdominal pain, constipation and abdominal distention.  Genitourinary: Negative for dysuria, urgency, frequency, flank pain and difficulty urinating.  Musculoskeletal: Positive for back pain. Negative for joint swelling and gait problem.  Skin: Negative for  rash.  Neurological: Negative for weakness and numbness.    Allergies  Tetracyclines & related  Home Medications   Current Outpatient Rx  Name Route Sig Dispense Refill  . ALBUTEROL SULFATE HFA 108 (90 BASE) MCG/ACT IN AERS Inhalation Inhale 2 puffs into the lungs every 6 (six) hours as needed.    . FENTANYL 100 MCG/HR TD PT72 Transdermal Place 1 patch onto the skin 2 days. Patient states he took patch off 3 days ago    . HYDROCODONE-ACETAMINOPHEN 10-325 MG PO TABS Oral Take 1 tablet by mouth every 6 (six) hours as needed. For pain    . OXYCODONE-ACETAMINOPHEN 10-325 MG PO TABS Oral Take 1 tablet by mouth every 4 (four) hours as needed for pain. 30 tablet 0  . SERTRALINE HCL 100 MG PO TABS Oral Take 100 mg by mouth daily.      BP 139/94  Pulse 102  Temp 98.5 F (36.9 C) (Oral)  Resp 18  Ht 6' (1.829 m)  Wt 230 lb (104.327 kg)  BMI 31.19 kg/m2  SpO2 97%  Physical Exam  Nursing note and vitals reviewed. Constitutional: He appears well-developed and well-nourished.  HENT:  Head: Normocephalic.  Eyes: Conjunctivae normal are normal.  Neck: Normal range of motion. Neck supple.  Cardiovascular: Normal rate and intact distal pulses.        Pedal pulses normal.  Pulmonary/Chest: Effort normal.  Abdominal: Soft. Bowel sounds are normal. He exhibits no distension  and no mass.  Musculoskeletal: Normal range of motion. He exhibits no edema.       Lumbar back: He exhibits tenderness. He exhibits no swelling, no edema and no spasm.  Neurological: He is alert. He has normal strength. He displays no atrophy and no tremor. No sensory deficit. Gait normal.  Reflex Scores:      Patellar reflexes are 2+ on the right side and 2+ on the left side.      Achilles reflexes are 2+ on the right side and 2+ on the left side.      4/5 strength right hip flexors (baseline per pt)  Ankle flexion and extension intact.  Skin: Skin is warm and dry.  Psychiatric: He has a normal mood and affect.    ED  Course  Procedures (including critical care time)  Labs Reviewed - No data to display No results found.   1. Lumbar radicular pain   2. Chronic back pain    Dilaudid 2 mg IM given   MDM  Acute on chronic low back pain with no new neuro deficits or injury.  Pt encouraged f/u with pcp and/or Dr Channing Mutters.  Prescribed percocet.  The patient appears reasonably screened and/or stabilized for discharge and I doubt any other medical condition or other [facility] requiring further screening, evaluation, or treatment in the ED at this time prior to discharge.         Burgess Amor, PA 09/27/12 1249

## 2012-09-27 NOTE — ED Notes (Signed)
Pt presents with chronic back pain excerebration that began 2 days ago. Pt states has rx for fentanyl patch but cannot afford them until Monday.   Pt states pain has intensified over past day. NAD noted. Pt denies bowel and bladder incontinence.

## 2012-09-27 NOTE — ED Notes (Signed)
Low back pain and rt leg pain, chronic.  Out of meds

## 2012-10-21 ENCOUNTER — Encounter (HOSPITAL_COMMUNITY): Payer: Self-pay | Admitting: Emergency Medicine

## 2012-10-21 ENCOUNTER — Emergency Department (HOSPITAL_COMMUNITY)
Admission: EM | Admit: 2012-10-21 | Discharge: 2012-10-21 | Disposition: A | Discharge status: Home / Self Care | Payer: Self-pay | Attending: Emergency Medicine | Admitting: Emergency Medicine

## 2012-10-21 DIAGNOSIS — Z79899 Other long term (current) drug therapy: Secondary | ICD-10-CM | POA: Insufficient documentation

## 2012-10-21 DIAGNOSIS — G8929 Other chronic pain: Secondary | ICD-10-CM | POA: Insufficient documentation

## 2012-10-21 DIAGNOSIS — M545 Low back pain: Secondary | ICD-10-CM

## 2012-10-21 DIAGNOSIS — IMO0002 Reserved for concepts with insufficient information to code with codable children: Secondary | ICD-10-CM | POA: Insufficient documentation

## 2012-10-21 DIAGNOSIS — G935 Compression of brain: Secondary | ICD-10-CM | POA: Insufficient documentation

## 2012-10-21 DIAGNOSIS — Z760 Encounter for issue of repeat prescription: Secondary | ICD-10-CM | POA: Insufficient documentation

## 2012-10-21 DIAGNOSIS — Z9889 Other specified postprocedural states: Secondary | ICD-10-CM | POA: Insufficient documentation

## 2012-10-21 DIAGNOSIS — F17200 Nicotine dependence, unspecified, uncomplicated: Secondary | ICD-10-CM | POA: Insufficient documentation

## 2012-10-21 DIAGNOSIS — M5416 Radiculopathy, lumbar region: Secondary | ICD-10-CM

## 2012-10-21 DIAGNOSIS — Z8739 Personal history of other diseases of the musculoskeletal system and connective tissue: Secondary | ICD-10-CM | POA: Insufficient documentation

## 2012-10-21 MED ORDER — CYCLOBENZAPRINE HCL 10 MG PO TABS: 10.0000 mg | ORAL_TABLET | Freq: Three times a day (TID) | ORAL | Status: DC | PRN

## 2012-10-21 MED ORDER — HYDROMORPHONE HCL PF 2 MG/ML IJ SOLN
2.0000 mg | Freq: Once | INTRAMUSCULAR | Status: AC
  Administered 2012-10-21: 2 mg via INTRAMUSCULAR
  Filled 2012-10-21: qty 1

## 2012-10-21 MED ORDER — HYDROCODONE-ACETAMINOPHEN 10-325 MG PO TABS: 1.0000 | ORAL_TABLET | Freq: Four times a day (QID) | ORAL | Status: DC | PRN

## 2012-10-21 NOTE — ED Provider Notes (Signed)
History     CSN: [id number]  Arrival date & time 10/21/12  1029   First MD Initiated Contact with Patient 10/21/12 1113      Chief Complaint  Patient presents with  . Back Pain    (Consider location/radiation/quality/duration/timing/severity/associated sxs/prior treatment) HPI Comments: Patient with hx of chronic low back pain c/o  Worsening pain for several days after running out of his hydrocodone and fentanyl patches.  States that he has hx of previous lumbar fusion and is waiting to have another surgery to his lumbar spine.  States his PMD, Dr. Renard Matter as been treating his pain, but recently he has not had the finances to get his pain medication filled.  Pain is similar to previous.  He denies numbness or weakness of the extremity, incontinence, dysuria, or abdominal pain.    Patient is a 51 y.o. male presenting with back pain. The history is provided by the patient.  Back Pain  This is a chronic problem. The current episode started more than 2 days ago. The problem occurs constantly. The problem has not changed since onset.The pain is associated with no known injury. The pain is present in the lumbar spine. The quality of the pain is described as aching. The pain does not radiate. The symptoms are aggravated by bending, certain positions and twisting. Pertinent negatives include no chest pain, no fever, no numbness, no abdominal pain, no abdominal swelling, no bowel incontinence, no perianal numbness, no bladder incontinence, no dysuria, no pelvic pain, no leg pain, no paresthesias, no paresis, no tingling and no weakness. He has tried analgesics for the symptoms. The treatment provided mild relief.    Past Medical History  Diagnosis Date  . Spinal stenosis of lumbar region   . Chronic back pain   . Arnold-Chiari syndrome   . Arthritis     Past Surgical History  Procedure Date  . Back surgery     No family history on file.  History  Substance Use Topics  . Smoking status:  Current Every Day Smoker  . Smokeless tobacco: Not on file  . Alcohol Use: Yes     Comment: 2-3 beers a week      Review of Systems  Constitutional: Negative for fever.  Respiratory: Negative for shortness of breath.   Cardiovascular: Negative for chest pain.  Gastrointestinal: Negative for vomiting, abdominal pain, constipation and bowel incontinence.  Genitourinary: Negative for bladder incontinence, dysuria, hematuria, flank pain, decreased urine volume, difficulty urinating and pelvic pain.       No perineal numbness or incontinence of urine or feces  Musculoskeletal: Positive for back pain. Negative for joint swelling.  Skin: Negative for rash.  Neurological: Negative for tingling, weakness, numbness and paresthesias.  All other systems reviewed and are negative.    Allergies  Tetracyclines & related  Home Medications   Current Outpatient Rx  Name  Route  Sig  Dispense  Refill  . ALBUTEROL SULFATE HFA 108 (90 BASE) MCG/ACT IN AERS   Inhalation   Inhale 2 puffs into the lungs every 6 (six) hours as needed.         . FENTANYL 100 MCG/HR TD PT72   Transdermal   Place 1 patch onto the skin 2 days. Patient states he took patch off 3 days ago         . HYDROCODONE-ACETAMINOPHEN 10-325 MG PO TABS   Oral   Take 1 tablet by mouth every 6 (six) hours as needed. For pain         .  OXYCODONE-ACETAMINOPHEN 10-325 MG PO TABS   Oral   Take 1 tablet by mouth every 4 (four) hours as needed for pain.   30 tablet   0   . SERTRALINE HCL 100 MG PO TABS   Oral   Take 100 mg by mouth daily.           BP 161/91  Pulse 82  Temp 97.7 F (36.5 C) (Oral)  Resp 18  Ht 6' (1.829 m)  Wt 230 lb (104.327 kg)  BMI 31.19 kg/m2  SpO2 98%  Physical Exam  Nursing note and vitals reviewed. Constitutional: He is oriented to person, place, and time. He appears well-developed and well-nourished. No distress.  HENT:  Head: Normocephalic and atraumatic.  Neck: Normal range of  motion. Neck supple.  Cardiovascular: Normal rate, regular rhythm and intact distal pulses.   No murmur heard. Pulmonary/Chest: Effort normal and breath sounds normal.  Musculoskeletal: He exhibits tenderness. He exhibits no edema.       Lumbar back: He exhibits tenderness, bony tenderness and pain. He exhibits normal range of motion, no swelling, no deformity, no laceration and normal pulse.       Back:       ttp of the lumbar spine and paraspinal muscles.    Neurological: He is alert and oriented to person, place, and time. No cranial nerve deficit or sensory deficit. He exhibits normal muscle tone. Coordination and gait normal.  Reflex Scores:      Patellar reflexes are 2+ on the right side and 2+ on the left side.      Achilles reflexes are 2+ on the right side and 2+ on the left side. Skin: Skin is warm and dry.    ED Course  Procedures (including critical care time)  Labs Reviewed - No data to display No results found.      MDM   Previous ED chart reviewed.  Pt was advised that he will need further managment of his chronic back pain with his PMD or his neurosurgeon.  Pt verbalized understanding and agrees to care plan.    Doubt any emergent neurological or infectious process.  Patient has ttp of the lumbar spine and paraspinal muscles.  No focal neuro deficits on exam.  Ambulates with a cane but has a steady gait.     Prescribed: norco #15 flexeril   Sharonna Vinje L. [place], [place] 10/23/12 1653

## 2012-10-21 NOTE — ED Notes (Signed)
Pt c/o chronic back pain and leg pain. Pt states he is out of his pain patches and hydrocodone.

## 2012-10-21 NOTE — ED Notes (Signed)
Pt states his sister is on the way to get him and take him home.

## 2012-10-22 ENCOUNTER — Emergency Department (HOSPITAL_COMMUNITY)
Admission: EM | Admit: 2012-10-22 | Discharge: 2012-10-22 | Disposition: A | Discharge status: Home / Self Care | Payer: Self-pay | Attending: Emergency Medicine | Admitting: Emergency Medicine

## 2012-10-22 ENCOUNTER — Encounter (HOSPITAL_COMMUNITY): Payer: Self-pay | Admitting: Emergency Medicine

## 2012-10-22 DIAGNOSIS — Z79899 Other long term (current) drug therapy: Secondary | ICD-10-CM | POA: Insufficient documentation

## 2012-10-22 DIAGNOSIS — F17200 Nicotine dependence, unspecified, uncomplicated: Secondary | ICD-10-CM | POA: Insufficient documentation

## 2012-10-22 DIAGNOSIS — M545 Low back pain, unspecified: Secondary | ICD-10-CM | POA: Insufficient documentation

## 2012-10-22 DIAGNOSIS — M48061 Spinal stenosis, lumbar region without neurogenic claudication: Secondary | ICD-10-CM | POA: Insufficient documentation

## 2012-10-22 DIAGNOSIS — G8929 Other chronic pain: Secondary | ICD-10-CM | POA: Insufficient documentation

## 2012-10-22 DIAGNOSIS — Q054 Unspecified spina bifida with hydrocephalus: Secondary | ICD-10-CM | POA: Insufficient documentation

## 2012-10-22 DIAGNOSIS — Z8739 Personal history of other diseases of the musculoskeletal system and connective tissue: Secondary | ICD-10-CM | POA: Insufficient documentation

## 2012-10-22 MED ORDER — HYDROMORPHONE HCL PF 1 MG/ML IJ SOLN
2.0000 mg | Freq: Once | INTRAMUSCULAR | Status: AC
  Administered 2012-10-22: 2 mg via INTRAMUSCULAR
  Filled 2012-10-22: qty 2

## 2012-10-22 MED ORDER — OXYCODONE-ACETAMINOPHEN 5-325 MG PO TABS: 1.0000 | ORAL_TABLET | ORAL | Status: AC | PRN

## 2012-10-22 MED ORDER — HYDROMORPHONE HCL PF 1 MG/ML IJ SOLN: 1.0000 mg | Freq: Once | INTRAMUSCULAR | Status: DC

## 2012-10-22 NOTE — ED Provider Notes (Signed)
Medical screening examination/treatment/procedure(s) were performed by non-physician practitioner and as supervising physician I was immediately available for consultation/collaboration.   Elizbeth Posa L Dhanvin Szeto, MD 10/22/12 2317 

## 2012-10-22 NOTE — ED Notes (Signed)
Pt stated he "have some hydrocodones left, but they are not helping my pain"

## 2012-10-22 NOTE — ED Notes (Signed)
Pt c/o back/leg pain, pt was seen for the same yesterday.

## 2012-10-22 NOTE — ED Notes (Signed)
Pt reports low back pain, was seen in er for same yesterday. Reports cont. pain

## 2012-10-22 NOTE — ED Provider Notes (Signed)
History     CSN: [id number]  Arrival date & time 10/22/12  1714   First MD Initiated Contact with Patient 10/22/12 1841      Chief Complaint  Patient presents with  . Back Pain  . Leg Pain    (Consider location/radiation/quality/duration/timing/severity/associated sxs/prior treatment) HPI Comments: Erik [name]  presents with acute on chronic low back pain which has which has been worsened since he ran out of his duragesic patch.  He is under the care of Dr. Channing Mutters and anticipating a lumbar fusion in the near future.   Patient denies any new injury specifically.  There is radiation into the right lower extremity which is chronic and not worsened along with numbness in the right leg which is also chronic.  He denies urinary or bowel retention or incontinence.  Patient does not have a history of cancer or IVDU.   Patient is a 51 y.o. male presenting with leg pain. The history is provided by the patient.  Leg Pain  Pertinent negatives include no numbness.    Past Medical History  Diagnosis Date  . Spinal stenosis of lumbar region   . Chronic back pain   . Arnold-Chiari syndrome   . Arthritis     Past Surgical History  Procedure Date  . Back surgery     History reviewed. No pertinent family history.  History  Substance Use Topics  . Smoking status: Current Every Day Smoker  . Smokeless tobacco: Not on file  . Alcohol Use: Yes     Comment: 2-3 beers a week      Review of Systems  Constitutional: Negative for fever.  Respiratory: Negative for shortness of breath.   Cardiovascular: Negative for chest pain and leg swelling.  Gastrointestinal: Negative for abdominal pain, constipation and abdominal distention.  Genitourinary: Negative for dysuria, urgency, frequency, flank pain and difficulty urinating.  Musculoskeletal: Positive for back pain. Negative for joint swelling and gait problem.  Skin: Negative for rash.  Neurological: Negative for weakness and numbness.     Allergies  Tetracyclines & related  Home Medications   Current Outpatient Rx  Name  Route  Sig  Dispense  Refill  . ALBUTEROL SULFATE HFA 108 (90 BASE) MCG/ACT IN AERS   Inhalation   Inhale 2 puffs into the lungs every 6 (six) hours as needed.         . CYCLOBENZAPRINE HCL 10 MG PO TABS   Oral   Take 1 tablet (10 mg total) by mouth 3 (three) times daily as needed for muscle spasms.   21 tablet   0   . FENTANYL 100 MCG/HR TD PT72   Transdermal   Place 1 patch onto the skin 2 days. Patient states he took patch off 3 days ago         . HYDROCODONE-ACETAMINOPHEN 10-325 MG PO TABS   Oral   Take 1 tablet by mouth every 6 (six) hours as needed for pain.   15 tablet   0   . SERTRALINE HCL 100 MG PO TABS   Oral   Take 100 mg by mouth daily.         . OXYCODONE-ACETAMINOPHEN 5-325 MG PO TABS   Oral   Take 1 tablet by mouth every 4 (four) hours as needed for pain.   15 tablet   0     BP 144/98  Pulse 105  Temp 97.9 F (36.6 C) (Oral)  Resp 20  Ht 6' (1.829 m)  Wt 230  lb (104.327 kg)  BMI 31.19 kg/m2  SpO2 98%  Physical Exam  Nursing note and vitals reviewed. Constitutional: He appears well-developed and well-nourished.  HENT:  Head: Normocephalic.  Eyes: Conjunctivae normal are normal.  Neck: Normal range of motion. Neck supple.  Cardiovascular: Normal rate and intact distal pulses.        Pedal pulses normal.  Pulmonary/Chest: Effort normal.  Abdominal: Soft. Bowel sounds are normal. He exhibits no distension and no mass.  Musculoskeletal: Normal range of motion. He exhibits no edema.       Lumbar back: He exhibits tenderness. He exhibits no swelling, no edema and no spasm.       Right paralumbar tenderness to palpation.  No midline pain.  Neurological: He is alert. He has normal strength. He displays no atrophy and no tremor. No sensory deficit. Gait normal.  Reflex Scores:      Patellar reflexes are 2+ on the right side and 2+ on the left side.       Achilles reflexes are 2+ on the right side and 2+ on the left side.      No strength deficit noted in hip and knee flexor and extensor muscle groups.  Ankle flexion and extension intact.  Skin: Skin is warm and dry.  Psychiatric: He has a normal mood and affect.    ED Course  Procedures (including critical care time)  Labs Reviewed - No data to display No results found.   1. Chronic low back pain       MDM  Chronic low back pain with no neurologic symptomatology.  Patient was encouraged to followup with Dr. Channing Mutters for further management of his symptoms.  He was prescribed oxycodone #15.  Also given Dilaudid 2 mg IM prior to discharge home.  No neuro deficit on exam or by history to suggest emergent or surgical presentation.  Also discussed worsened sx that should prompt immediate re-evaluation including distal weakness, bowel/bladder retention/incontinence.              Burgess Amor, PA 10/22/12 1859

## 2012-10-24 NOTE — ED Provider Notes (Signed)
Medical screening examination/treatment/procedure(s) were performed by non-physician practitioner and as supervising physician I was immediately available for consultation/collaboration.  Shelda Jakes, MD 10/24/12 2139

## 2012-12-31 ENCOUNTER — Emergency Department (HOSPITAL_COMMUNITY)
Admission: EM | Admit: 2012-12-31 | Discharge: 2012-12-31 | Disposition: A | Discharge status: Home / Self Care | Payer: Self-pay | Attending: Emergency Medicine | Admitting: Emergency Medicine

## 2012-12-31 ENCOUNTER — Encounter (HOSPITAL_COMMUNITY): Payer: Self-pay | Admitting: Emergency Medicine

## 2012-12-31 DIAGNOSIS — Z8669 Personal history of other diseases of the nervous system and sense organs: Secondary | ICD-10-CM | POA: Insufficient documentation

## 2012-12-31 DIAGNOSIS — Z9889 Other specified postprocedural states: Secondary | ICD-10-CM | POA: Insufficient documentation

## 2012-12-31 DIAGNOSIS — W010XXA Fall on same level from slipping, tripping and stumbling without subsequent striking against object, initial encounter: Secondary | ICD-10-CM | POA: Insufficient documentation

## 2012-12-31 DIAGNOSIS — IMO0002 Reserved for concepts with insufficient information to code with codable children: Secondary | ICD-10-CM | POA: Insufficient documentation

## 2012-12-31 DIAGNOSIS — F329 Major depressive disorder, single episode, unspecified: Secondary | ICD-10-CM | POA: Insufficient documentation

## 2012-12-31 DIAGNOSIS — F17200 Nicotine dependence, unspecified, uncomplicated: Secondary | ICD-10-CM | POA: Insufficient documentation

## 2012-12-31 DIAGNOSIS — Z8739 Personal history of other diseases of the musculoskeletal system and connective tissue: Secondary | ICD-10-CM | POA: Insufficient documentation

## 2012-12-31 DIAGNOSIS — S79929A Unspecified injury of unspecified thigh, initial encounter: Secondary | ICD-10-CM | POA: Insufficient documentation

## 2012-12-31 DIAGNOSIS — F3289 Other specified depressive episodes: Secondary | ICD-10-CM | POA: Insufficient documentation

## 2012-12-31 DIAGNOSIS — Z79899 Other long term (current) drug therapy: Secondary | ICD-10-CM | POA: Insufficient documentation

## 2012-12-31 DIAGNOSIS — G8929 Other chronic pain: Secondary | ICD-10-CM

## 2012-12-31 DIAGNOSIS — S99919A Unspecified injury of unspecified ankle, initial encounter: Secondary | ICD-10-CM | POA: Insufficient documentation

## 2012-12-31 DIAGNOSIS — Y9289 Other specified places as the place of occurrence of the external cause: Secondary | ICD-10-CM | POA: Insufficient documentation

## 2012-12-31 DIAGNOSIS — Y9389 Activity, other specified: Secondary | ICD-10-CM | POA: Insufficient documentation

## 2012-12-31 DIAGNOSIS — S8990XA Unspecified injury of unspecified lower leg, initial encounter: Secondary | ICD-10-CM | POA: Insufficient documentation

## 2012-12-31 DIAGNOSIS — S79919A Unspecified injury of unspecified hip, initial encounter: Secondary | ICD-10-CM | POA: Insufficient documentation

## 2012-12-31 HISTORY — DX: Major depressive disorder, single episode, unspecified: F32.9

## 2012-12-31 HISTORY — DX: Depression, unspecified: F32.A

## 2012-12-31 MED ORDER — ONDANSETRON 8 MG PO TBDP
8.0000 mg | ORAL_TABLET | Freq: Once | ORAL | Status: AC
  Administered 2012-12-31: 8 mg via ORAL
  Filled 2012-12-31: qty 1

## 2012-12-31 MED ORDER — HYDROMORPHONE HCL PF 1 MG/ML IJ SOLN
2.0000 mg | Freq: Once | INTRAMUSCULAR | Status: AC
  Administered 2012-12-31: 2 mg via INTRAMUSCULAR
  Filled 2012-12-31: qty 2

## 2012-12-31 MED ORDER — ETODOLAC 500 MG PO TABS: 500.0000 mg | ORAL_TABLET | Freq: Two times a day (BID) | ORAL | Status: DC

## 2012-12-31 NOTE — ED Provider Notes (Signed)
History    CSN: [id number] Arrival date & time 12/31/12  0917 First MD Initiated Contact with Patient 12/31/12 [phone]      Chief Complaint  Patient presents with  . Back Pain  . Hip Pain  . Leg Pain    Patient is a 52 y.o. male presenting with back pain, hip pain, and leg pain. The history is provided by the patient.  Back Pain  This is a recurrent (he has had trouble with his back for years. He has had prior back surgeries. Patient is supposed to have another back surgery but is waiting for his disability to kick in.) problem. The current episode started yesterday. The problem occurs constantly. The problem has not changed since onset.Associated with: patient has history of chronic back pain however yesterday morning he tripped over his dog which strained his back. The pain is present in the lumbar spine. The quality of the pain is described as stabbing. The pain radiates to the right thigh. The pain is severe. The symptoms are aggravated by bending and twisting. Associated symptoms include leg pain. Pertinent negatives include no numbness, no bowel incontinence, no perianal numbness, no dysuria and no weakness. Treatments tried: patient usually takes hydrocodone as well as a Duragesic patch. Patient states he has been taking his hydrocodone more than usual and he ran out.  Hip Pain  Leg Pain  Pertinent negatives include no numbness.    Past Medical History  Diagnosis Date  . Spinal stenosis of lumbar region   . Chronic back pain   . Arnold-Chiari syndrome   . Arthritis   . Depression     Past Surgical History  Procedure Date  . Back surgery     Family History  Problem Relation Age of Onset  . Cancer Mother     History  Substance Use Topics  . Smoking status: Current Every Day Smoker -- 1.0 packs/day for 15 years    Types: Cigarettes  . Smokeless tobacco: Never Used  . Alcohol Use: 1.8 oz/week    3 Cans of beer per week     Comment: reports not drinking in 3 weeks       Review of Systems  Gastrointestinal: Negative for bowel incontinence.  Genitourinary: Negative for dysuria.  Musculoskeletal: Positive for back pain.  Neurological: Negative for weakness and numbness.  All other systems reviewed and are negative.    Allergies  Tetracyclines & related  Home Medications   Current Outpatient Rx  Name  Route  Sig  Dispense  Refill  . ALBUTEROL SULFATE HFA 108 (90 BASE) MCG/ACT IN AERS   Inhalation   Inhale 2 puffs into the lungs every 6 (six) hours as needed.         . CYCLOBENZAPRINE HCL 10 MG PO TABS   Oral   Take 1 tablet (10 mg total) by mouth 3 (three) times daily as needed for muscle spasms.   21 tablet   0   . FENTANYL 100 MCG/HR TD PT72   Transdermal   Place 1 patch onto the skin 2 days. Patient states he took patch off 3 days ago         . HYDROCODONE-ACETAMINOPHEN 10-325 MG PO TABS   Oral   Take 1 tablet by mouth every 6 (six) hours as needed for pain.   15 tablet   0   . SERTRALINE HCL 100 MG PO TABS   Oral   Take 100 mg by mouth daily.  BP 153/94  Pulse 108  Temp 98.8 F (37.1 C) (Oral)  Resp 20  Ht 6' (1.829 m)  Wt 235 lb (106.595 kg)  BMI 31.87 kg/m2  SpO2 97%  Physical Exam  Nursing note and vitals reviewed. Constitutional: He appears well-developed and well-nourished.  HENT:  Head: Normocephalic and atraumatic.  Right Ear: External ear normal.  Left Ear: External ear normal.  Nose: Nose normal.  Eyes: Conjunctivae normal and EOM are normal.  Neck: Neck supple. No tracheal deviation present.  Pulmonary/Chest: Effort normal. No stridor. No respiratory distress.  Musculoskeletal: He exhibits no edema and no tenderness.       Lumbar back: He exhibits decreased range of motion, tenderness, pain and spasm. He exhibits no swelling and no edema.  Neurological: He is alert. He is not disoriented. No cranial nerve deficit or sensory deficit. He exhibits normal muscle tone. Coordination  normal.  Reflex Scores:      Patellar reflexes are 2+ on the right side and 2+ on the left side.      Achilles reflexes are 2+ on the right side and 2+ on the left side. Skin: Skin is warm and dry. No rash noted. He is not diaphoretic. No erythema.       No erythema or lesions noted  Psychiatric: He has a normal mood and affect. His behavior is normal. Thought content normal.    ED Course  Procedures (including critical care time) ED Medication Orders         Status Ordering Provider    12/31/12 0945    HYDROmorphone (DILAUDID) injection 2 mg Once    Discontinue    Route: Intramuscular  Ordered Dose: 2 mg        Acknowledged Garion Wempe R    12/31/12 0945    ondansetron (ZOFRAN-ODT) disintegrating tablet 8 mg Once    Discontinue    Route: Oral  Ordered Dose: 8 mg        Labs Reviewed - No data to display No results found.    MDM  The patient has chronic recurrent back pain. He requested a refill of his hydrocodone. I explained to patient that he'll be better if his primary prescribes narcotic pain medications considering the chronic nature of his illness. The patient will be given a dose of hydromorphone I am for pain relief. I will prescribe him an anti-inflammatory pain medication as an alternative. He will contact his doctor tomorrow regarding refilling his narcotic medications        Celene Kras, MD 12/31/12 [phone]

## 2012-12-31 NOTE — ED Notes (Signed)
Patient c/o lower back pain, right hip and leg pain. Per patient chronic back pain has had 5 sx on back and has to have another surgery once on disability.  Patient reports getting up yesterday morning to go to bathroom and tripping over dog. Patient states "I thought I could make it at home but I can't."

## 2013-06-25 ENCOUNTER — Encounter (HOSPITAL_COMMUNITY): Payer: Self-pay

## 2013-06-25 ENCOUNTER — Emergency Department (HOSPITAL_COMMUNITY)
Admission: EM | Admit: 2013-06-25 | Discharge: 2013-06-25 | Disposition: A | Discharge status: Home / Self Care | Payer: Self-pay | Attending: Emergency Medicine | Admitting: Emergency Medicine

## 2013-06-25 DIAGNOSIS — Z9889 Other specified postprocedural states: Secondary | ICD-10-CM | POA: Insufficient documentation

## 2013-06-25 DIAGNOSIS — F329 Major depressive disorder, single episode, unspecified: Secondary | ICD-10-CM | POA: Insufficient documentation

## 2013-06-25 DIAGNOSIS — M545 Low back pain, unspecified: Secondary | ICD-10-CM | POA: Insufficient documentation

## 2013-06-25 DIAGNOSIS — Q054 Unspecified spina bifida with hydrocephalus: Secondary | ICD-10-CM | POA: Insufficient documentation

## 2013-06-25 DIAGNOSIS — F3289 Other specified depressive episodes: Secondary | ICD-10-CM | POA: Insufficient documentation

## 2013-06-25 DIAGNOSIS — G8929 Other chronic pain: Secondary | ICD-10-CM | POA: Insufficient documentation

## 2013-06-25 DIAGNOSIS — Z8739 Personal history of other diseases of the musculoskeletal system and connective tissue: Secondary | ICD-10-CM | POA: Insufficient documentation

## 2013-06-25 DIAGNOSIS — M25559 Pain in unspecified hip: Secondary | ICD-10-CM | POA: Insufficient documentation

## 2013-06-25 DIAGNOSIS — M129 Arthropathy, unspecified: Secondary | ICD-10-CM | POA: Insufficient documentation

## 2013-06-25 DIAGNOSIS — Z79899 Other long term (current) drug therapy: Secondary | ICD-10-CM | POA: Insufficient documentation

## 2013-06-25 DIAGNOSIS — F17200 Nicotine dependence, unspecified, uncomplicated: Secondary | ICD-10-CM | POA: Insufficient documentation

## 2013-06-25 DIAGNOSIS — M549 Dorsalgia, unspecified: Secondary | ICD-10-CM

## 2013-06-25 MED ORDER — KETOROLAC TROMETHAMINE 60 MG/2ML IM SOLN
60.0000 mg | Freq: Once | INTRAMUSCULAR | Status: AC
  Administered 2013-06-25: 60 mg via INTRAMUSCULAR
  Filled 2013-06-25: qty 2

## 2013-06-25 MED ORDER — PREDNISONE 20 MG PO TABS: 40.0000 mg | ORAL_TABLET | Freq: Every day | ORAL | Status: DC

## 2013-06-25 MED ORDER — OXYCODONE-ACETAMINOPHEN 5-325 MG PO TABS
2.0000 | ORAL_TABLET | Freq: Once | ORAL | Status: AC
  Administered 2013-06-25: 2 via ORAL
  Filled 2013-06-25: qty 2

## 2013-06-25 MED ORDER — OXYCODONE-ACETAMINOPHEN 5-325 MG PO TABS: 2.0000 | ORAL_TABLET | ORAL | Status: DC | PRN

## 2013-06-25 MED ORDER — NAPROXEN 500 MG PO TABS: 500.0000 mg | ORAL_TABLET | Freq: Two times a day (BID) | ORAL | Status: DC

## 2013-06-25 NOTE — ED Notes (Signed)
Patient with no complaints at this time. Respirations even and unlabored. Skin warm/dry. Discharge instructions reviewed with patient at this time. Patient given opportunity to voice concerns/ask questions. Patient discharged at this time and left Emergency Department with steady gait.   

## 2013-06-25 NOTE — ED Notes (Signed)
Pt reports chronic r hip and leg pain from multiple back surgeries.  Reports pain flared up around 4am.  Also c/o pain to both knees.  Pt has abrasion to left knee.  Reports fell recently.

## 2013-06-25 NOTE — ED Provider Notes (Signed)
History    This chart was scribed for No att. providers found, by Yevette Edwards, ED Scribe. This patient was seen in room APA07/APA07 and the patient's care was started at 9:51 AM  CSN: [id number] Arrival date & time 06/25/13  4782  First MD Initiated Contact with Patient 06/25/13 0740     No chief complaint on file.   The history is provided by the patient. No language interpreter was used.   HPI Comments: Erik [name] is a 52 y.o. Male, with a h/o of chronic pain, who presents to the Emergency Department complaining of pain to his left hip which radiates posteriorly down his leg and to his knee.  The pt reports that after experiencing pain to his left lower extremities, he also began to experience pain to his right hip and knee .  He reports that he takes 60 mg of morphine daily and 10-25 hydrocodone as needed. 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 He denies being under a pain contract with Dr. Soyla Murphy. He occassionally takes prednisone, and his last dose was a month ago. He states that he has had three laminectomies. Past Medical History  Diagnosis Date  . Spinal stenosis of lumbar region   . Chronic back pain   . Arnold-Chiari syndrome   . Arthritis   . Depression    Past Surgical History  Procedure Laterality Date  . Back surgery     Family History  Problem Relation Age of Onset  . Cancer Mother    History  Substance Use Topics  . Smoking status: Current Every Day Smoker -- 1.00 packs/day for 15 years    Types: Cigarettes  . Smokeless tobacco: Never Used  . Alcohol Use: 1.8 oz/week    3 Cans of beer per week     Comment: reports not drinking in 3 weeks    Review of Systems A complete 10 system review of systems was obtained, and all systems were negative except where indicated in the HPI and PE.   Allergies  Tetracyclines & related  Home Medications    Current Outpatient Rx  Name  Route  Sig  Dispense  Refill  . albuterol (PROVENTIL HFA;VENTOLIN HFA) 108 (90 BASE) MCG/ACT inhaler   Inhalation   Inhale 2 puffs into the lungs every 6 (six) hours as needed.         . cyclobenzaprine (FLEXERIL) 10 MG tablet   Oral   Take 1 tablet (10 mg total) by mouth 3 (three) times daily as needed for muscle spasms.   21 tablet   0   . etodolac (LODINE) 500 MG tablet   Oral   Take 1 tablet (500 mg total) by mouth 2 (two) times daily.   20 tablet   0   . fentaNYL (DURAGESIC - DOSED MCG/HR) 100 MCG/HR   Transdermal   Place 1 patch onto the skin 2 days. Patient states he took patch off 3 days ago         . HYDROcodone-acetaminophen (NORCO) 10-325 MG per tablet   Oral   Take 1 tablet by mouth every 6 (six) hours as needed for pain.   15 tablet   0   . naproxen (NAPROSYN) 500 MG tablet   Oral   Take 1 tablet (500 mg total) by mouth 2 (two) times daily with a meal.   30 tablet  0   . oxyCODONE-acetaminophen (PERCOCET/ROXICET) 5-325 MG per tablet   Oral   Take 2 tablets by mouth every 4 (four) hours as needed for pain.   15 tablet   0   . predniSONE (DELTASONE) 20 MG tablet   Oral   Take 2 tablets (40 mg total) by mouth daily. Take 40 mg by mouth daily for 3 days, then 20mg  by mouth daily for 3 days, then 10mg  daily for 3 days   12 tablet   0   . sertraline (ZOLOFT) 100 MG tablet   Oral   Take 100 mg by mouth daily.          Triage Vitals: BP 166/93  Pulse 100  Temp(Src) 98.4 F (36.9 C) (Oral)  Resp 18  Ht 6' (1.829 m)  Wt 248 lb (112.492 kg)  BMI 33.63 kg/m2  SpO2 96%  Physical Exam  Nursing note and vitals reviewed. Constitutional: He appears well-developed and well-nourished. No distress.  HENT:  Head: Normocephalic and atraumatic.  Mouth/Throat: Oropharynx is clear and moist. No oropharyngeal exudate.  Eyes: Conjunctivae and EOM are normal. Pupils are equal, round, and reactive to light. Right eye exhibits  no discharge. Left eye exhibits no discharge. No scleral icterus.  Neck: Normal range of motion. Neck supple. No JVD present. No thyromegaly present.  Cardiovascular: Normal rate, regular rhythm, normal heart sounds and intact distal pulses.  Exam reveals no gallop and no friction rub.   No murmur heard. Pulmonary/Chest: Effort normal and breath sounds normal. No respiratory distress. He has no wheezes. He has no rales.  Abdominal: Soft. Bowel sounds are normal. He exhibits no distension and no mass. There is no tenderness.  Musculoskeletal: Normal range of motion. He exhibits tenderness. He exhibits no edema.  Mild tenderness over lumbar spine. Prior surgical spot. Pain with passive straight leg raise on right, 45 degrees. Pain with cross extensor raise.   Lymphadenopathy:    He has no cervical adenopathy.  Neurological: He is alert. Coordination normal.  Sensations and motors intact both ways. Reflexes are preserved at the knees.   Skin: Skin is warm and dry. No rash noted. No erythema.  Psychiatric: He has a normal mood and affect. His behavior is normal.    ED Course  Procedures (including critical care time)  DIAGNOSTIC STUDIES: Oxygen Saturation is 96% on room air, normal by my interpretation.   COORDINATION OF CARE:  7:50 AM-Discussed treatment plan with patient which includes pain medication, and the patient agreed to the plan.   Labs Reviewed - No data to display No results found. 1. Chronic back pain     MDM  The patient has the ability to spontaneously straight leg raise, his strength is normal at the hips and the knees bilaterally. He has no neurologic deficits except for a small area of numbness just proximal to the right knee on the lateral surface of the leg. He states this has been that way for some time, it is not new to the last several days. I do not detect any focal neurologic deficits, he does have pain with a straight leg raise passively as well as a crossed  straight leg raise. This would indicate that he has some ongoing lower back herniated disc issues or other spinal issues that require further neurosurgical followup. He already has followup with a neurosurgeon, he sees a Dr. Channing Mutters in he did. He will see his doctor in 2 days for medication refills and until that time he'll be  given a prednisone taper, a small amount of Percocet and Naprosyn. He appears stable for discharge. The patient was ambulatory out of the emergency department with his cane.  Meds given in ED:  Medications  ketorolac (TORADOL) injection 60 mg (60 mg Intramuscular Given 06/25/13 0801)  oxyCODONE-acetaminophen (PERCOCET/ROXICET) 5-325 MG per tablet 2 tablet (2 tablets Oral Given 06/25/13 0801)    Discharge Medication List as of 06/25/2013  7:53 AM    START taking these medications   Details  naproxen (NAPROSYN) 500 MG tablet Take 1 tablet (500 mg total) by mouth 2 (two) times daily with a meal., Starting 06/25/2013, Until Discontinued, Print    oxyCODONE-acetaminophen (PERCOCET/ROXICET) 5-325 MG per tablet Take 2 tablets by mouth every 4 (four) hours as needed for pain., Starting 06/25/2013, Until Discontinued, Print    predniSONE (DELTASONE) 20 MG tablet Take 2 tablets (40 mg total) by mouth daily. Take 40 mg by mouth daily for 3 days, then 20mg  by mouth daily for 3 days, then 10mg  daily for 3 days, Starting 06/25/2013, Until Discontinued, Print         I personally performed the services described in this documentation, which was scribed in my presence. The recorded information has been reviewed and is accurate.    Vida Roller, MD 06/25/13 [phone]

## 2014-04-06 ENCOUNTER — Emergency Department (HOSPITAL_COMMUNITY)
Admission: EM | Admit: 2014-04-06 | Discharge: 2014-04-06 | Disposition: A | Discharge status: Home / Self Care | Payer: Medicare Other | Attending: Emergency Medicine | Admitting: Emergency Medicine

## 2014-04-06 ENCOUNTER — Encounter (HOSPITAL_COMMUNITY): Payer: Self-pay | Admitting: Emergency Medicine

## 2014-04-06 DIAGNOSIS — Q054 Unspecified spina bifida with hydrocephalus: Secondary | ICD-10-CM | POA: Insufficient documentation

## 2014-04-06 DIAGNOSIS — F329 Major depressive disorder, single episode, unspecified: Secondary | ICD-10-CM | POA: Insufficient documentation

## 2014-04-06 DIAGNOSIS — IMO0002 Reserved for concepts with insufficient information to code with codable children: Secondary | ICD-10-CM | POA: Insufficient documentation

## 2014-04-06 DIAGNOSIS — G8929 Other chronic pain: Secondary | ICD-10-CM

## 2014-04-06 DIAGNOSIS — Z9889 Other specified postprocedural states: Secondary | ICD-10-CM | POA: Insufficient documentation

## 2014-04-06 DIAGNOSIS — M79609 Pain in unspecified limb: Secondary | ICD-10-CM | POA: Insufficient documentation

## 2014-04-06 DIAGNOSIS — F17200 Nicotine dependence, unspecified, uncomplicated: Secondary | ICD-10-CM | POA: Insufficient documentation

## 2014-04-06 DIAGNOSIS — Z791 Long term (current) use of non-steroidal anti-inflammatories (NSAID): Secondary | ICD-10-CM | POA: Insufficient documentation

## 2014-04-06 DIAGNOSIS — Z79899 Other long term (current) drug therapy: Secondary | ICD-10-CM | POA: Insufficient documentation

## 2014-04-06 DIAGNOSIS — F3289 Other specified depressive episodes: Secondary | ICD-10-CM | POA: Insufficient documentation

## 2014-04-06 DIAGNOSIS — M129 Arthropathy, unspecified: Secondary | ICD-10-CM | POA: Insufficient documentation

## 2014-04-06 DIAGNOSIS — M549 Dorsalgia, unspecified: Secondary | ICD-10-CM | POA: Insufficient documentation

## 2014-04-06 MED ORDER — OXYCODONE-ACETAMINOPHEN 5-325 MG PO TABS
2.0000 | ORAL_TABLET | Freq: Once | ORAL | Status: AC
  Administered 2014-04-06: 2 via ORAL
  Filled 2014-04-06: qty 2

## 2014-04-06 NOTE — Discharge Instructions (Signed)

## 2014-04-06 NOTE — ED Notes (Signed)
Pt carried out in wheelchair. Pt has called family for a ride.

## 2014-04-06 NOTE — ED Notes (Signed)
Pt w/ chronic leg & back pain, states he is out of his medication & does not get anymore till Monday. Wanting a shot & enough meds to make it to Monday.

## 2014-04-06 NOTE — ED Provider Notes (Signed)
CSN: [id number]     Arrival date & time 04/06/14  0153 History   First MD Initiated Contact with Patient 04/06/14 0208     Chief Complaint  Patient presents with  . Leg Pain  . Back Pain      HPI Patient states a long-standing history of chronic back and leg pain.  He is scheduled for upcoming back surgery.  He is on morphine at home for pain and states that he ran out of his pain medication.  He is requesting a shot for pain here in the emergency department.  He describes no new lower extremity weakness.  He reports no fevers or chills.  He denies abdominal pain nausea vomiting diarrhea.  No recent trauma or illness.  No other complaints.   Past Medical History  Diagnosis Date  . Spinal stenosis of lumbar region   . Chronic back pain   . Arnold-Chiari syndrome   . Arthritis   . Depression    Past Surgical History  Procedure Laterality Date  . Back surgery     Family History  Problem Relation Age of Onset  . Cancer Mother    History  Substance Use Topics  . Smoking status: Current Every Day Smoker -- 1.00 packs/day for 15 years    Types: Cigarettes  . Smokeless tobacco: Never Used  . Alcohol Use: 1.8 oz/week    3 Cans of beer per week     Comment: reports not drinking in 3 weeks    Review of Systems  All other systems reviewed and are negative.     Allergies  Tetracyclines & related  Home Medications   Prior to Admission medications   Medication Sig Start Date End Date Taking? Authorizing Provider  amitriptyline (ELAVIL) 50 MG tablet Take 50 mg by mouth at bedtime.   Yes Historical Provider, MD  budesonide-formoterol (SYMBICORT) 160-4.5 MCG/ACT inhaler Inhale 2 puffs into the lungs daily.   Yes Historical Provider, MD  cyclobenzaprine (FLEXERIL) 10 MG tablet Take 1 tablet (10 mg total) by mouth 3 (three) times daily as needed for muscle spasms. 10/21/12  Yes Tammy L. Triplett, PA-C  gabapentin (NEURONTIN) 300 MG capsule Take 300 mg by mouth 3 (three) times  daily.   Yes Historical Provider, MD  morphine (KADIAN) 100 MG 24 hr capsule Take 100 mg by mouth 2 (two) times daily.   Yes Historical Provider, MD  morphine (MSIR) 15 MG tablet Take 15 mg by mouth every 4 (four) hours as needed for severe pain (for break through).   Yes Historical Provider, MD  sertraline (ZOLOFT) 100 MG tablet Take 100 mg by mouth daily.   Yes Historical Provider, MD  albuterol (PROVENTIL HFA;VENTOLIN HFA) 108 (90 BASE) MCG/ACT inhaler Inhale 2 puffs into the lungs every 6 (six) hours as needed.    Historical Provider, MD  etodolac (LODINE) 500 MG tablet Take 1 tablet (500 mg total) by mouth 2 (two) times daily. 12/31/12   Kathalene Frames, MD  fentaNYL (DURAGESIC - DOSED MCG/HR) 100 MCG/HR Place 1 patch onto the skin 2 days. Patient states he took patch off 3 days ago    Historical Provider, MD  HYDROcodone-acetaminophen (NORCO) 10-325 MG per tablet Take 1 tablet by mouth every 6 (six) hours as needed for pain. 10/21/12   Tammy L. Triplett, PA-C  naproxen (NAPROSYN) 500 MG tablet Take 1 tablet (500 mg total) by mouth 2 (two) times daily with a meal. 06/25/13   Johnna Acosta, MD  oxyCODONE-acetaminophen (PERCOCET/ROXICET)  5-325 MG per tablet Take 2 tablets by mouth every 4 (four) hours as needed for pain. 06/25/13   Johnna Acosta, MD  predniSONE (DELTASONE) 20 MG tablet Take 2 tablets (40 mg total) by mouth daily. Take 40 mg by mouth daily for 3 days, then 20mg  by mouth daily for 3 days, then 10mg  daily for 3 days 06/25/13   Johnna Acosta, MD   BP 171/114  Pulse 96  Temp(Src) 98.3 F (36.8 C) (Oral)  Resp 20  Ht 6' (1.829 m)  Wt 248 lb (112.492 kg)  BMI 33.63 kg/m2  SpO2 96% Physical Exam  Nursing note and vitals reviewed. Constitutional: He is oriented to person, place, and time. He appears well-developed and well-nourished.  HENT:  Head: Normocephalic.  Eyes: EOM are normal.  Neck: Normal range of motion.  Pulmonary/Chest: Effort normal.  Abdominal: He exhibits no  distension.  Musculoskeletal: Normal range of motion.  No thoracic or lumbar point tenderness.  Full range of motion bilateral lower extremities with normal strength  Neurological: He is alert and oriented to person, place, and time.  Psychiatric: He has a normal mood and affect.    ED Course  Procedures (including critical care time) Labs Review Labs Reviewed - No data to display  Imaging Review No results found.   EKG Interpretation None      MDM   Final diagnoses:  Chronic back pain    Exacerbation of chronic pain.  Discharge home with follow up with PCP.  Medical screening examination completed    Hoy Morn, MD 04/06/14 0230

## 2014-07-04 ENCOUNTER — Encounter (HOSPITAL_COMMUNITY): Payer: Self-pay | Admitting: Pharmacy Technician

## 2014-07-05 ENCOUNTER — Ambulatory Visit (HOSPITAL_COMMUNITY)
Admission: RE | Admit: 2014-07-05 | Discharge: 2014-07-05 | Disposition: A | Discharge status: Home / Self Care | Payer: Medicare Other | Source: Ambulatory Visit | Attending: Anesthesiology | Admitting: Anesthesiology

## 2014-07-05 ENCOUNTER — Encounter (HOSPITAL_COMMUNITY): Payer: Self-pay

## 2014-07-05 ENCOUNTER — Encounter (HOSPITAL_COMMUNITY)
Admission: RE | Admit: 2014-07-05 | Discharge: 2014-07-05 | Disposition: A | Discharge status: Home / Self Care | Payer: Medicare Other | Source: Ambulatory Visit | Attending: Oral Surgery | Admitting: Oral Surgery

## 2014-07-05 DIAGNOSIS — Z01818 Encounter for other preprocedural examination: Secondary | ICD-10-CM | POA: Insufficient documentation

## 2014-07-05 HISTORY — DX: Other seasonal allergic rhinitis: J30.2

## 2014-07-05 HISTORY — DX: Gastro-esophageal reflux disease without esophagitis: K21.9

## 2014-07-05 HISTORY — DX: Wheezing: R06.2

## 2014-07-05 LAB — BASIC METABOLIC PANEL
Anion gap: 13 (ref 5–15)
BUN: 11 mg/dL (ref 6–23)
CHLORIDE: 100 meq/L (ref 96–112)
CO2: 25 mEq/L (ref 19–32)
Calcium: 9.5 mg/dL (ref 8.4–10.5)
Creatinine, Ser: 0.68 mg/dL (ref 0.50–1.35)
GFR calc Af Amer: >90 mL/min (ref >90)
GFR calc non Af Amer: >90 mL/min (ref >90)
Glucose, Bld: 89 mg/dL (ref 70–99)
Potassium: 4 mEq/L (ref 3.7–5.3)
Sodium: 138 mEq/L (ref 137–147)

## 2014-07-05 LAB — CBC
HEMATOCRIT: 44.9 % (ref 39.0–52.0)
Hemoglobin: 15.2 g/dL (ref 13.0–17.0)
MCH: 31.7 pg (ref 26.0–34.0)
MCHC: 33.9 g/dL (ref 30.0–36.0)
MCV: 93.5 fL (ref 78.0–100.0)
Platelets: 191 10*3/uL (ref 150–400)
RBC: 4.8 MIL/uL (ref 4.22–5.81)
RDW: 13.9 % (ref 11.5–15.5)
WBC: 11.5 10*3/uL — AB (ref 4.0–10.5)

## 2014-07-05 NOTE — Progress Notes (Signed)
Pt  Currently denies SOB, chest pain, and being under the care of a cardiologist. Pt stated that he had an episode of wheezing on Tuesday, 07/02/14 where he had to use his inhaler for relief. Pt denied having a chest x ray within the last year.

## 2014-07-05 NOTE — Progress Notes (Signed)
07/05/14 1331  OBSTRUCTIVE SLEEP APNEA  Have you ever been diagnosed with sleep apnea through a sleep study? No  Do you snore loudly (loud enough to be heard through closed doors)?  0  Do you often feel tired, fatigued, or sleepy during the daytime? 1  Has anyone observed you stop breathing during your sleep? 0  Do you have, or are you being treated for high blood pressure? 0  BMI more than 35 kg/m2? 1  Age over 53 years old? 1  Neck circumference greater than 40 cm/16 inches? 1  Gender: 1  Obstructive Sleep Apnea Score 5

## 2014-07-07 MED ORDER — CEFAZOLIN SODIUM-DEXTROSE 2-3 GM-% IV SOLR
2.0000 g | INTRAVENOUS | Status: AC
  Filled 2014-07-07: qty 50

## 2014-07-08 ENCOUNTER — Encounter (HOSPITAL_COMMUNITY): Payer: Self-pay | Admitting: *Deleted

## 2014-07-08 ENCOUNTER — Encounter (HOSPITAL_COMMUNITY): Payer: Medicare Other | Admitting: Certified Registered Nurse Anesthetist

## 2014-07-08 ENCOUNTER — Encounter (HOSPITAL_COMMUNITY)
Admission: RE | Disposition: A | Discharge status: Home / Self Care | Payer: Self-pay | Source: Ambulatory Visit | Attending: Oral Surgery

## 2014-07-08 ENCOUNTER — Ambulatory Visit (HOSPITAL_COMMUNITY)
Admission: RE | Admit: 2014-07-08 | Discharge: 2014-07-08 | Disposition: A | Discharge status: Home / Self Care | Payer: Medicare Other | Source: Ambulatory Visit | Attending: Oral Surgery | Admitting: Oral Surgery

## 2014-07-08 ENCOUNTER — Ambulatory Visit (HOSPITAL_COMMUNITY): Payer: Medicare Other | Admitting: Certified Registered Nurse Anesthetist

## 2014-07-08 DIAGNOSIS — M129 Arthropathy, unspecified: Secondary | ICD-10-CM | POA: Diagnosis not present

## 2014-07-08 DIAGNOSIS — K148 Other diseases of tongue: Secondary | ICD-10-CM | POA: Diagnosis not present

## 2014-07-08 DIAGNOSIS — K0889 Other specified disorders of teeth and supporting structures: Secondary | ICD-10-CM | POA: Insufficient documentation

## 2014-07-08 DIAGNOSIS — F329 Major depressive disorder, single episode, unspecified: Secondary | ICD-10-CM | POA: Diagnosis not present

## 2014-07-08 DIAGNOSIS — Z881 Allergy status to other antibiotic agents status: Secondary | ICD-10-CM | POA: Diagnosis not present

## 2014-07-08 DIAGNOSIS — G8929 Other chronic pain: Secondary | ICD-10-CM | POA: Insufficient documentation

## 2014-07-08 DIAGNOSIS — M549 Dorsalgia, unspecified: Secondary | ICD-10-CM | POA: Insufficient documentation

## 2014-07-08 DIAGNOSIS — J45909 Unspecified asthma, uncomplicated: Secondary | ICD-10-CM

## 2014-07-08 DIAGNOSIS — K029 Dental caries, unspecified: Secondary | ICD-10-CM

## 2014-07-08 DIAGNOSIS — L570 Actinic keratosis: Secondary | ICD-10-CM

## 2014-07-08 DIAGNOSIS — F3289 Other specified depressive episodes: Secondary | ICD-10-CM | POA: Insufficient documentation

## 2014-07-08 DIAGNOSIS — F411 Generalized anxiety disorder: Secondary | ICD-10-CM

## 2014-07-08 DIAGNOSIS — K219 Gastro-esophageal reflux disease without esophagitis: Secondary | ICD-10-CM | POA: Insufficient documentation

## 2014-07-08 DIAGNOSIS — Q054 Unspecified spina bifida with hydrocephalus: Secondary | ICD-10-CM | POA: Insufficient documentation

## 2014-07-08 DIAGNOSIS — M1990 Unspecified osteoarthritis, unspecified site: Secondary | ICD-10-CM

## 2014-07-08 HISTORY — PX: MULTIPLE EXTRACTIONS WITH ALVEOLOPLASTY: SHX5342

## 2014-07-08 SURGERY — MULTIPLE EXTRACTION WITH ALVEOLOPLASTY
Anesthesia: General | Site: Mouth

## 2014-07-08 MED ORDER — 0.9 % SODIUM CHLORIDE (POUR BTL) OPTIME
TOPICAL | Status: DC | PRN
  Administered 2014-07-08: 1000 mL

## 2014-07-08 MED ORDER — ONDANSETRON HCL 4 MG/2ML IJ SOLN
INTRAMUSCULAR | Status: AC
  Filled 2014-07-08: qty 2

## 2014-07-08 MED ORDER — FENTANYL CITRATE 0.05 MG/ML IJ SOLN
INTRAMUSCULAR | Status: DC | PRN
  Administered 2014-07-08 (×2): 50 ug via INTRAVENOUS
  Administered 2014-07-08: 100 ug via INTRAVENOUS

## 2014-07-08 MED ORDER — SUCCINYLCHOLINE CHLORIDE 20 MG/ML IJ SOLN
INTRAMUSCULAR | Status: AC
Start: 2014-07-08 — End: 2014-07-08
  Filled 2014-07-08: qty 1

## 2014-07-08 MED ORDER — MIDAZOLAM HCL 2 MG/2ML IJ SOLN
INTRAMUSCULAR | Status: AC
  Filled 2014-07-08: qty 2

## 2014-07-08 MED ORDER — LACTATED RINGERS IV SOLN
INTRAVENOUS | Status: DC | PRN
  Administered 2014-07-08 (×2): via INTRAVENOUS

## 2014-07-08 MED ORDER — FENTANYL CITRATE 0.05 MG/ML IJ SOLN
INTRAMUSCULAR | Status: AC
  Filled 2014-07-08: qty 5

## 2014-07-08 MED ORDER — SODIUM CHLORIDE 0.9 % IR SOLN
Status: DC | PRN
  Administered 2014-07-08: 1000 mL

## 2014-07-08 MED ORDER — PHENYLEPHRINE 40 MCG/ML (10ML) SYRINGE FOR IV PUSH (FOR BLOOD PRESSURE SUPPORT)
PREFILLED_SYRINGE | INTRAVENOUS | Status: AC
  Filled 2014-07-08: qty 10

## 2014-07-08 MED ORDER — LIDOCAINE HCL (CARDIAC) 20 MG/ML IV SOLN
INTRAVENOUS | Status: AC
  Filled 2014-07-08: qty 5

## 2014-07-08 MED ORDER — PROPOFOL 10 MG/ML IV BOLUS
INTRAVENOUS | Status: AC
  Filled 2014-07-08: qty 20

## 2014-07-08 MED ORDER — OXYCODONE HCL 5 MG PO TABS: 5.0000 mg | ORAL_TABLET | Freq: Once | ORAL | Status: AC | PRN

## 2014-07-08 MED ORDER — OXYCODONE HCL 5 MG/5ML PO SOLN
ORAL | Status: AC
  Filled 2014-07-08: qty 5

## 2014-07-08 MED ORDER — PROPOFOL 10 MG/ML IV BOLUS
INTRAVENOUS | Status: DC | PRN
  Administered 2014-07-08: 200 mg via INTRAVENOUS

## 2014-07-08 MED ORDER — GLYCOPYRROLATE 0.2 MG/ML IJ SOLN
INTRAMUSCULAR | Status: DC | PRN
  Administered 2014-07-08: 0.4 mg via INTRAVENOUS

## 2014-07-08 MED ORDER — MIDAZOLAM HCL 5 MG/5ML IJ SOLN
INTRAMUSCULAR | Status: DC | PRN
  Administered 2014-07-08: 2 mg via INTRAVENOUS

## 2014-07-08 MED ORDER — LIDOCAINE-EPINEPHRINE 2 %-1:100000 IJ SOLN
INTRAMUSCULAR | Status: AC
  Filled 2014-07-08: qty 1

## 2014-07-08 MED ORDER — OXYMETAZOLINE HCL 0.05 % NA SOLN
NASAL | Status: AC
  Filled 2014-07-08: qty 15

## 2014-07-08 MED ORDER — LIDOCAINE-EPINEPHRINE 2 %-1:100000 IJ SOLN
INTRAMUSCULAR | Status: DC | PRN
  Administered 2014-07-08: 14 mL via INTRADERMAL

## 2014-07-08 MED ORDER — ROCURONIUM BROMIDE 100 MG/10ML IV SOLN
INTRAVENOUS | Status: DC | PRN
  Administered 2014-07-08: 30 mg via INTRAVENOUS

## 2014-07-08 MED ORDER — OXYCODONE HCL 5 MG/5ML PO SOLN
5.0000 mg | Freq: Once | ORAL | Status: AC | PRN
  Administered 2014-07-08: 5 mg via ORAL

## 2014-07-08 MED ORDER — ONDANSETRON HCL 4 MG/2ML IJ SOLN
INTRAMUSCULAR | Status: DC | PRN
  Administered 2014-07-08: 4 mg via INTRAVENOUS

## 2014-07-08 MED ORDER — LIDOCAINE HCL (CARDIAC) 20 MG/ML IV SOLN
INTRAVENOUS | Status: DC | PRN
  Administered 2014-07-08: 60 mg via INTRAVENOUS

## 2014-07-08 MED ORDER — CEFAZOLIN SODIUM-DEXTROSE 2-3 GM-% IV SOLR
INTRAVENOUS | Status: DC | PRN
  Administered 2014-07-08: 2 g via INTRAVENOUS

## 2014-07-08 MED ORDER — LACTATED RINGERS IV SOLN
INTRAVENOUS | Status: DC
  Administered 2014-07-08: 07:00:00 via INTRAVENOUS

## 2014-07-08 MED ORDER — HYDROCODONE-ACETAMINOPHEN 10-325 MG PO TABS: 1.0000 | ORAL_TABLET | Freq: Four times a day (QID) | ORAL | Status: DC | PRN

## 2014-07-08 MED ORDER — NEOSTIGMINE METHYLSULFATE 10 MG/10ML IV SOLN
INTRAVENOUS | Status: DC | PRN
  Administered 2014-07-08: 3 mg via INTRAVENOUS

## 2014-07-08 MED ORDER — HYDROMORPHONE HCL PF 1 MG/ML IJ SOLN: 0.2500 mg | INTRAMUSCULAR | Status: DC | PRN

## 2014-07-08 MED ORDER — PHENYLEPHRINE HCL 10 MG/ML IJ SOLN
INTRAMUSCULAR | Status: DC | PRN
  Administered 2014-07-08 (×2): 80 ug via INTRAVENOUS

## 2014-07-08 SURGICAL SUPPLY — 30 items
BLADE 10 SAFETY STRL DISP (BLADE) ×3 IMPLANT
BUR CROSS CUT FISSURE 1.6 (BURR) ×2 IMPLANT
BUR CROSS CUT FISSURE 1.6MM (BURR) ×1
BUR EGG ELITE 4.0 (BURR) ×2 IMPLANT
BUR EGG ELITE 4.0MM (BURR) ×1
CANISTER SUCTION 2500CC (MISCELLANEOUS) ×3 IMPLANT
COVER SURGICAL LIGHT HANDLE (MISCELLANEOUS) ×3 IMPLANT
CRADLE DONUT ADULT HEAD (MISCELLANEOUS) ×3 IMPLANT
GAUZE PACKING FOLDED 2  STR (GAUZE/BANDAGES/DRESSINGS) ×2
GAUZE PACKING FOLDED 2 STR (GAUZE/BANDAGES/DRESSINGS) ×1 IMPLANT
GLOVE BIO SURGEON STRL SZ 6.5 (GLOVE) ×4 IMPLANT
GLOVE BIO SURGEON STRL SZ7.5 (GLOVE) ×3 IMPLANT
GLOVE BIO SURGEONS STRL SZ 6.5 (GLOVE) ×2
GLOVE BIOGEL PI IND STRL 7.0 (GLOVE) ×2 IMPLANT
GLOVE BIOGEL PI INDICATOR 7.0 (GLOVE) ×4
GOWN STRL REUS W/ TWL LRG LVL3 (GOWN DISPOSABLE) ×2 IMPLANT
GOWN STRL REUS W/ TWL XL LVL3 (GOWN DISPOSABLE) ×1 IMPLANT
GOWN STRL REUS W/TWL LRG LVL3 (GOWN DISPOSABLE) ×4
GOWN STRL REUS W/TWL XL LVL3 (GOWN DISPOSABLE) ×2
KIT BASIN OR (CUSTOM PROCEDURE TRAY) ×3 IMPLANT
KIT ROOM TURNOVER OR (KITS) ×3 IMPLANT
NEEDLE 22X1 1/2 (OR ONLY) (NEEDLE) ×3 IMPLANT
NS IRRIG 1000ML POUR BTL (IV SOLUTION) ×3 IMPLANT
PAD ARMBOARD 7.5X6 YLW CONV (MISCELLANEOUS) ×6 IMPLANT
SUT CHROMIC 3 0 PS 2 (SUTURE) ×6 IMPLANT
SYR CONTROL 10ML LL (SYRINGE) ×3 IMPLANT
TOWEL OR 17X26 10 PK STRL BLUE (TOWEL DISPOSABLE) ×3 IMPLANT
TRAY ENT MC OR (CUSTOM PROCEDURE TRAY) ×3 IMPLANT
TUBING IRRIGATION (MISCELLANEOUS) ×3 IMPLANT
YANKAUER SUCT BULB TIP NO VENT (SUCTIONS) ×3 IMPLANT

## 2014-07-08 NOTE — Op Note (Signed)
07/08/2014  9:54 AM  PATIENT:  Lazarus Gowda  53 y.o. male  PRE-OPERATIVE DIAGNOSIS:   NON RESTORABLE TEETH # 4, 6, 8, 9, 11, 12, 13, 15; LESION  Right side of TONGUE  POST-OPERATIVE DIAGNOSIS:  SAME  PROCEDURE:  Procedure(s): MULTIPLE EXTRACION TEETH # 4, 6, 8, 9, 11, 12, 13, 15, ALVEOLOPLASTY ; EXCISION LESION RIGHT SIDE OF TONGUE  SURGEON:  Surgeon(s): Gae Bon, DDS  ANESTHESIA:   local and general  EBL:  minimal  DRAINS: none   SPECIMEN:  No Specimen  COUNTS:  YES  PLAN OF CARE: Discharge to home after PACU  PATIENT DISPOSITION:  PACU - hemodynamically stable.   PROCEDURE DETAILS: Dictation # [id number]  Gae Bon, DMD 07/08/2014 9:54 AM

## 2014-07-08 NOTE — Transfer of Care (Signed)
Immediate Anesthesia Transfer of Care Note  Patient: Erik [name]  Procedure(s) Performed: Procedure(s): MULTIPLE EXTRACION WITH ALVEOLOPLASTY with EXCISION LESION RIGHT SIDE OF TONGUE (N/A)  Patient Location: PACU  Anesthesia Type:General  Level of Consciousness: awake, alert , oriented and patient cooperative  Airway & Oxygen Therapy: Patient Spontanous Breathing and Patient connected to nasal cannula oxygen  Post-op Assessment: Report given to PACU RN, Post -op Vital signs reviewed and stable and Patient moving all extremities X 4  Post vital signs: Reviewed and stable  Complications: No apparent anesthesia complications

## 2014-07-08 NOTE — Anesthesia Postprocedure Evaluation (Signed)
  Anesthesia Post-op Note  Patient: Erik [name]  Procedure(s) Performed: Procedure(s): MULTIPLE EXTRACION WITH ALVEOLOPLASTY with EXCISION LESION RIGHT SIDE OF TONGUE (N/A)  Patient Location: PACU  Anesthesia Type:General  Level of Consciousness: awake and alert   Airway and Oxygen Therapy: Patient Spontanous Breathing  Post-op Pain: none  Post-op Assessment: Post-op Vital signs reviewed, Patient's Cardiovascular Status Stable and Respiratory Function Stable  Post-op Vital Signs: Reviewed  Filed Vitals:   07/08/14 1030  BP: 139/86  Pulse: 83  Temp:   Resp: 13    Complications: No apparent anesthesia complications

## 2014-07-08 NOTE — H&P (Signed)
HISTORY AND PHYSICAL  Erik [name] is a 53 y.o. male patient with CC: painful teeth. Referred by general dentist for removal of remaining maxillary teeth.  No diagnosis found.  Past Medical History  Diagnosis Date  . Spinal stenosis of lumbar region   . Chronic back pain   . Arnold-Chiari syndrome   . Arthritis   . Depression   . Seasonal allergies   . Wheezing   . GERD (gastroesophageal reflux disease)     Current Facility-Administered Medications  Medication Dose Route Frequency Provider Last Rate Last Dose  . lactated ringers infusion   Intravenous Continuous Arabella Merles, MD 50 mL/hr at 07/08/14 4401     Allergies  Allergen Reactions  . Tetracyclines & Related    Active Problems:   * No active hospital problems. *  Vitals: Blood pressure 167/100, pulse 87, temperature 98.2 F (36.8 C), temperature source Oral, resp. rate 20, height 6' (1.829 m), weight 118.842 kg (262 lb), SpO2 97.00%. Lab results:No results found for this or any previous visit (from the past 68 hour(s)). Radiology Results: No results found. General appearance: alert and cooperative Head: Normocephalic, without obvious abnormality, atraumatic Eyes: negative Nose: Nares normal. Septum midline. Mucosa normal. No drainage or sinus tenderness. Throat: decay, periodontal disease teeth #'s 4, 6, 8, 9, 11, 12, 13, 15. 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 Neck: no adenopathy, supple, symmetrical, trachea midline and thyroid not enlarged, symmetric, no tenderness/mass/nodules Resp: clear to auscultation bilaterally Cardio: regular rate and rhythm, S1, S2 normal, no murmur, click, rub or gallop  Assessment: Non-restorable teeth #'s 4, 6, 8, 9, 11, 12, 13, 15. Lesion right floor of mouth.  Plan: Dental extractions, alveoloplasty, removal lesion floor of mouth. General anesthesia. Day surgery.   Erik [name] M 07/08/2014

## 2014-07-08 NOTE — Anesthesia Preprocedure Evaluation (Addendum)
Anesthesia Evaluation  Patient identified by MRN, date of birth, ID band Patient awake    Reviewed: Allergy & Precautions, H&P , NPO status , Patient's Chart, lab work & pertinent test results  Airway Mallampati: II TM Distance: >3 FB Neck ROM: Full    Dental no notable dental hx. (+) Poor Dentition, Dental Advisory Given   Pulmonary asthma , Current Smoker,  breath sounds clear to auscultation  Pulmonary exam normal       Cardiovascular negative cardio ROS  Rhythm:Regular Rate:Normal     Neuro/Psych Anxiety Depression negative neurological ROS  negative psych ROS   GI/Hepatic Neg liver ROS, GERD-  Medicated and Controlled,  Endo/Other  negative endocrine ROS  Renal/GU negative Renal ROS  negative genitourinary   Musculoskeletal   Abdominal   Peds  Hematology negative hematology ROS (+)   Anesthesia Other Findings   Reproductive/Obstetrics negative OB ROS                          Anesthesia Physical Anesthesia Plan  ASA: II  Anesthesia Plan: General   Post-op Pain Management:    Induction: Intravenous  Airway Management Planned: Nasal ETT  Additional Equipment:   Intra-op Plan:   Post-operative Plan: Extubation in OR  Informed Consent: I have reviewed the patients History and Physical, chart, labs and discussed the procedure including the risks, benefits and alternatives for the proposed anesthesia with the patient or authorized representative who has indicated his/her understanding and acceptance.   Dental advisory given  Plan Discussed with: CRNA, Anesthesiologist and Surgeon  Anesthesia Plan Comments:        Anesthesia Quick Evaluation

## 2014-07-08 NOTE — Op Note (Signed)
NAME:  Erik [name], Erik [name] NO.:  [id number]  MEDICAL RECORD NO.:  [id number]  LOCATION:  [facility]                         FACILITY:  [facility]  PHYSICIAN:  Gae Bon, M.D.  DATE OF BIRTH:  1961/08/09  DATE OF PROCEDURE:  07/08/2014 DATE OF DISCHARGE:                              OPERATIVE REPORT   PREOPERATIVE DIAGNOSIS:  Nonrestorable teeth #4, 6, 8, 9, 11, 12, 13, 15, lesion right lateral tongue.  POSTOPERATIVE DIAGNOSIS:  Nonrestorable teeth #4, 6, 8, 9, 11, 12, 13, 15, lesion right lateral tongue.  PROCEDURE:  Extraction of teeth #4, 6, 8, 9, 11, 12, 13, 15, alveoplasty right and left maxilla, excision lesion, right tongue.  SURGEON:  Gae Bon, MD  ANESTHESIA:  General nasal intubation.  DESCRIPTION OF PROCEDURE:  The patient was taken to the operating room, placed on the table in supine position.  General anesthesia was administered intravenously and a nasal endotracheal tube was placed and secured.  The eyes were protected and the patient was draped for the procedure.  Time-out was performed.  The posterior pharynx was suctioned and a throat pack was placed.  A 2% lidocaine with 1:100,000 epinephrine was infiltrated in an upper buccal and palatal injections along the maxilla around the teeth to be removed.  A total of 8 mL was utilized for this, and then injection was around the lesion of the tongue, approximately 4 mL was utilized for this.  Then, the tongue was retracted and a 15 blade was used to make an elliptical incision around the lesion, the incision was approximately 2 cm in length and 1 cm in width.  The margins of the incision were undermined and then closed primarily with 3-0 chromic.  Then, a bite block was placed in the right side of the mouth and the left maxilla was encountered.  A 15 blade was used to make an incision beginning at the distal aspect of tooth #15 carrying forward to approximately tooth #6 region.  The periosteum  was reflected from around the maxillary teeth on the left side and then a 301 elevator was used to elevate the teeth.  Then, the upper universal forceps #150 was used to extract the teeth.  #15 fractured and a distal buccal root was removed using the Stryker handpiece under irrigation with a root tip pick.  Then, tooth #11 root was extracted using a Stryker handpiece under irrigation to remove cortical bone around the remainder of the tooth and then the tooth was elevated with a 301 elevator and removed from the mouth with the upper forceps.  Then, the periosteum was further reflected.  Alveoplasty was performed with the egg-shaped bur and bone file to allow for smooth contour for prosthesis. Then, the bone file was used and then the area was irrigated and closed with 3-0 chromic.  The bite block was repositioned to the other side of the mouth.  The 15 blade was used to make an incision around teeth #4 and 6.  Then, the periosteum was reflected from around these teeth.  The teeth were elevated with a 301 elevator, tooth #4 was removed with the #150 forceps.  Teeth #6 fractured at the  CEJ and additional bone was removed using the Stryker handpiece and fissure bur.  The tooth was then removed using the elevator.  Then, the periosteum was reflected to expose the alveolar crest in this area, and an egg-shaped bur and bone file were used to perform the alveoplasty and rule out for prosthetic contour when healing was complete.  The area was then irrigated and closed with 3-0 chromic.  The oral cavity was irrigated and suctioned. Throat pack was removed.  The patient was awakened and taken to the recovery room, breathing spontaneously in good condition.  ESTIMATED BLOOD LOSS:  Minimal.  COMPLICATIONS:  None.  SPECIMEN:  Lesion right lateral tongue.     Gae Bon, M.D.     SMJ/MEDQ  D:  07/08/2014  T:  07/08/2014  Job:  [id number]

## 2014-07-08 NOTE — Anesthesia Procedure Notes (Signed)
Date/Time: 07/08/2014 9:17 AM Performed by: Carney Living Pre-anesthesia Checklist: Patient identified, Emergency Drugs available, Suction available, Patient being monitored and Timeout performed Patient Re-evaluated:Patient Re-evaluated prior to inductionOxygen Delivery Method: Circle system utilized Preoxygenation: Pre-oxygenation with 100% oxygen Intubation Type: IV induction Ventilation: Mask ventilation without difficulty and Oral airway inserted - appropriate to patient size Tube type: Oral Nasal Tubes: Right Tube size: 7.0 mm Number of attempts: 2 Airway Equipment and Method: Video-laryngoscopy Placement Confirmation: ETT inserted through vocal cords under direct vision,  positive ETCO2 and breath sounds checked- equal and bilateral Tube secured with: Tape Dental Injury: Teeth and Oropharynx as per pre-operative assessment and Bloody posterior oropharynx

## 2014-07-11 ENCOUNTER — Encounter (HOSPITAL_COMMUNITY): Payer: Self-pay | Admitting: Oral Surgery

## 2015-12-10 ENCOUNTER — Other Ambulatory Visit (HOSPITAL_COMMUNITY): Payer: Self-pay | Admitting: Nurse Practitioner

## 2015-12-10 DIAGNOSIS — M5136 Other intervertebral disc degeneration, lumbar region: Secondary | ICD-10-CM

## 2015-12-17 ENCOUNTER — Ambulatory Visit (HOSPITAL_COMMUNITY)
Admission: RE | Admit: 2015-12-17 | Discharge: 2015-12-17 | Disposition: A | Discharge status: Home / Self Care | Payer: Medicare Other | Source: Ambulatory Visit | Attending: Nurse Practitioner | Admitting: Nurse Practitioner

## 2015-12-17 DIAGNOSIS — Z9889 Other specified postprocedural states: Secondary | ICD-10-CM | POA: Diagnosis not present

## 2015-12-17 DIAGNOSIS — M4807 Spinal stenosis, lumbosacral region: Secondary | ICD-10-CM | POA: Insufficient documentation

## 2015-12-17 DIAGNOSIS — M5489 Other dorsalgia: Secondary | ICD-10-CM | POA: Insufficient documentation

## 2015-12-17 DIAGNOSIS — M5136 Other intervertebral disc degeneration, lumbar region: Secondary | ICD-10-CM

## 2016-05-28 ENCOUNTER — Other Ambulatory Visit (HOSPITAL_COMMUNITY): Payer: Self-pay | Admitting: Internal Medicine

## 2016-06-03 ENCOUNTER — Other Ambulatory Visit (HOSPITAL_COMMUNITY): Payer: Self-pay | Admitting: Internal Medicine

## 2016-06-03 DIAGNOSIS — R748 Abnormal levels of other serum enzymes: Secondary | ICD-10-CM

## 2016-06-03 DIAGNOSIS — R7989 Other specified abnormal findings of blood chemistry: Secondary | ICD-10-CM

## 2016-06-03 DIAGNOSIS — R945 Abnormal results of liver function studies: Secondary | ICD-10-CM

## 2016-06-17 ENCOUNTER — Ambulatory Visit (HOSPITAL_COMMUNITY)
Admission: RE | Admit: 2016-06-17 | Discharge: 2016-06-17 | Disposition: A | Discharge status: Home / Self Care | Payer: Medicare Other | Source: Ambulatory Visit | Attending: Internal Medicine | Admitting: Internal Medicine

## 2016-06-17 ENCOUNTER — Other Ambulatory Visit: Payer: Self-pay

## 2016-06-17 DIAGNOSIS — R918 Other nonspecific abnormal finding of lung field: Secondary | ICD-10-CM | POA: Diagnosis not present

## 2016-06-17 DIAGNOSIS — R945 Abnormal results of liver function studies: Secondary | ICD-10-CM | POA: Diagnosis not present

## 2016-06-17 DIAGNOSIS — K760 Fatty (change of) liver, not elsewhere classified: Secondary | ICD-10-CM | POA: Insufficient documentation

## 2016-06-17 DIAGNOSIS — R748 Abnormal levels of other serum enzymes: Secondary | ICD-10-CM | POA: Diagnosis present

## 2016-06-17 DIAGNOSIS — R008 Other abnormalities of heart beat: Secondary | ICD-10-CM | POA: Insufficient documentation

## 2016-06-17 DIAGNOSIS — R7989 Other specified abnormal findings of blood chemistry: Secondary | ICD-10-CM

## 2016-06-17 DIAGNOSIS — J9811 Atelectasis: Secondary | ICD-10-CM | POA: Insufficient documentation

## 2016-06-17 MED ORDER — IOPAMIDOL (ISOVUE-300) INJECTION 61%
100.0000 mL | Freq: Once | INTRAVENOUS | Status: AC | PRN
  Administered 2016-06-17: 100 mL via INTRAVENOUS

## 2016-06-22 ENCOUNTER — Other Ambulatory Visit (HOSPITAL_COMMUNITY): Payer: Self-pay | Admitting: Internal Medicine

## 2016-06-22 DIAGNOSIS — F17210 Nicotine dependence, cigarettes, uncomplicated: Secondary | ICD-10-CM

## 2016-06-22 DIAGNOSIS — R945 Abnormal results of liver function studies: Secondary | ICD-10-CM

## 2016-06-22 DIAGNOSIS — R748 Abnormal levels of other serum enzymes: Secondary | ICD-10-CM

## 2016-07-01 ENCOUNTER — Ambulatory Visit (HOSPITAL_COMMUNITY)
Admission: RE | Admit: 2016-07-01 | Discharge: 2016-07-01 | Disposition: A | Discharge status: Home / Self Care | Payer: Medicare Other | Source: Ambulatory Visit | Attending: Internal Medicine | Admitting: Internal Medicine

## 2016-07-01 DIAGNOSIS — R945 Abnormal results of liver function studies: Secondary | ICD-10-CM | POA: Diagnosis present

## 2016-07-01 DIAGNOSIS — R748 Abnormal levels of other serum enzymes: Secondary | ICD-10-CM | POA: Diagnosis present

## 2016-07-01 DIAGNOSIS — R918 Other nonspecific abnormal finding of lung field: Secondary | ICD-10-CM | POA: Diagnosis not present

## 2016-07-01 DIAGNOSIS — C771 Secondary and unspecified malignant neoplasm of intrathoracic lymph nodes: Secondary | ICD-10-CM | POA: Diagnosis not present

## 2016-07-01 DIAGNOSIS — F17210 Nicotine dependence, cigarettes, uncomplicated: Secondary | ICD-10-CM | POA: Diagnosis present

## 2016-07-01 LAB — GLUCOSE, CAPILLARY: Glucose-Capillary: 128 mg/dL — ABNORMAL HIGH (ref 65–99)

## 2016-07-01 MED ORDER — FLUDEOXYGLUCOSE F - 18 (FDG) INJECTION
13.0000 | Freq: Once | INTRAVENOUS | Status: AC | PRN
  Administered 2016-07-01: 13 via INTRAVENOUS

## 2016-07-09 ENCOUNTER — Encounter (HOSPITAL_COMMUNITY): Payer: Medicare Other | Attending: Hematology | Admitting: Hematology

## 2016-07-09 ENCOUNTER — Encounter (HOSPITAL_COMMUNITY): Payer: Self-pay | Admitting: Hematology

## 2016-07-09 VITALS — BP 158/86 | HR 89 | Temp 99.2°F | Resp 20 | Wt 269.6 lb

## 2016-07-09 DIAGNOSIS — R590 Localized enlarged lymph nodes: Secondary | ICD-10-CM

## 2016-07-09 DIAGNOSIS — Z01818 Encounter for other preprocedural examination: Secondary | ICD-10-CM

## 2016-07-09 DIAGNOSIS — R918 Other nonspecific abnormal finding of lung field: Secondary | ICD-10-CM

## 2016-07-09 DIAGNOSIS — R945 Abnormal results of liver function studies: Secondary | ICD-10-CM

## 2016-07-09 DIAGNOSIS — C3491 Malignant neoplasm of unspecified part of right bronchus or lung: Secondary | ICD-10-CM | POA: Diagnosis not present

## 2016-07-09 DIAGNOSIS — D751 Secondary polycythemia: Secondary | ICD-10-CM

## 2016-07-09 LAB — COMPREHENSIVE METABOLIC PANEL
ALBUMIN: 4.1 g/dL (ref 3.5–5.0)
ALK PHOS: 105 U/L (ref 38–126)
ALT: 49 U/L (ref 17–63)
AST: 50 U/L — ABNORMAL HIGH (ref 15–41)
Anion gap: 5 (ref 5–15)
BILIRUBIN TOTAL: 0.7 mg/dL (ref 0.3–1.2)
BUN: 9 mg/dL (ref 6–20)
CALCIUM: 8.7 mg/dL — AB (ref 8.9–10.3)
CO2: 27 mmol/L (ref 22–32)
CREATININE: 0.51 mg/dL — AB (ref 0.61–1.24)
Chloride: 103 mmol/L (ref 101–111)
GFR calc Af Amer: >60 mL/min (ref >60)
GFR calc non Af Amer: >60 mL/min (ref >60)
GLUCOSE: 102 mg/dL — AB (ref 65–99)
Potassium: 3.7 mmol/L (ref 3.5–5.1)
SODIUM: 135 mmol/L (ref 135–145)
TOTAL PROTEIN: 8.4 g/dL — AB (ref 6.5–8.1)

## 2016-07-09 LAB — CBC WITH DIFFERENTIAL/PLATELET
BASOS PCT: 0 %
Basophils Absolute: 0 10*3/uL (ref 0.0–0.1)
EOS ABS: 0.1 10*3/uL (ref 0.0–0.7)
Eosinophils Relative: 1 %
HEMATOCRIT: 47 % (ref 39.0–52.0)
HEMOGLOBIN: 15.6 g/dL (ref 13.0–17.0)
Lymphocytes Relative: 33 %
Lymphs Abs: 2.8 10*3/uL (ref 0.7–4.0)
MCH: 33.2 pg (ref 26.0–34.0)
MCHC: 33.2 g/dL (ref 30.0–36.0)
MCV: 100 fL (ref 78.0–100.0)
Monocytes Absolute: 0.8 10*3/uL (ref 0.1–1.0)
Monocytes Relative: 9 %
NEUTROS ABS: 4.9 10*3/uL (ref 1.7–7.7)
NEUTROS PCT: 57 %
Platelets: 177 10*3/uL (ref 150–400)
RBC: 4.7 MIL/uL (ref 4.22–5.81)
RDW: 13.5 % (ref 11.5–15.5)
WBC: 8.6 10*3/uL (ref 4.0–10.5)

## 2016-07-09 LAB — PROTIME-INR
INR: 1.19
Prothrombin Time: 15.2 seconds (ref 11.4–15.2)

## 2016-07-09 LAB — LACTATE DEHYDROGENASE: LDH: 159 U/L (ref 98–192)

## 2016-07-09 LAB — APTT: aPTT: 37 seconds — ABNORMAL HIGH (ref 24–36)

## 2016-07-09 NOTE — Progress Notes (Signed)
Erik [name]'s reason for visit today is for labs as scheduled per MD orders.  Venipuncture performed with a 23 gauge butterfly needle to L Antecubital.  Erik [name] tolerated procedure well and without incident; questions were answered and patient was discharged.

## 2016-07-09 NOTE — Patient Instructions (Signed)
[place] at [facility] Discharge Instructions  RECOMMENDATIONS MADE BY THE CONSULTANT AND ANY TEST RESULTS WILL BE SENT TO YOUR REFERRING PHYSICIAN.   Exam and discussion by Dr Irene Limbo today Labs today Referral to Dr Roxan Hockey for tissue diagnosis Pulmonary functions test within one week Return to see the doctor in 2 weeks  Please call the clinic if you have any questions or concerns     Thank you for choosing [place] at [facility] to provide your oncology and hematology care.  To afford each patient quality time with our provider, please arrive at least 15 minutes before your scheduled appointment time.   Beginning January 23rd 2017 lab work for the [facility] will be done in the  Main lab at [facility] on 1st floor. If you have a lab appointment with the [facility] please come in thru the  Main Entrance and check in at the main information desk  You need to re-schedule your appointment should you arrive 10 or more minutes late.  We strive to give you quality time with our providers, and arriving late affects you and other patients whose appointments are after yours.  Also, if you no show three or more times for appointments you may be dismissed from the clinic at the providers discretion.     Again, thank you for choosing [facility].  Our hope is that these requests will decrease the amount of time that you wait before being seen by our physicians.       _____________________________________________________________  Should you have questions after your visit to [facility], please contact our office at [phone] between the hours of 8:30 a.m. and 4:30 p.m.  Voicemails left after 4:30 p.m. will not be returned until the following business day.  For prescription refill requests, have your pharmacy contact our office.         Resources For Cancer Patients and their Caregivers ? American Cancer  Society: Can assist with transportation, wigs, general needs, runs Look Good Feel Better.        279-042-0939 ? Cancer Care: Provides financial assistance, online support groups, medication/co-pay assistance.  1-800-813-HOPE 918-464-1598) ? [place] Assists [place] cancer patients and their families through emotional , educational and financial support.  854-447-4600 ? [place] DSS Where to apply for food stamps, Medicaid and utility assistance. 531-739-2898 ? [facility]: Transportation to medical appointments. 8171522912 ? Social Security Administration: May apply for disability if have a Stage IV cancer. (613)387-4913 (830) 382-3797 ? [employer], Disability and Transit Services: Assists with nutrition, care and transit needs. [place] Support Programs: '@10RELATIVEDAYS'$ @ > Cancer Support Group  2nd Tuesday of the month 1pm-2pm, Journey Room  > Creative Journey  3rd Tuesday of the month 1130am-1pm, Journey Room  > Look Good Feel Better  1st Wednesday of the month 10am-12 noon, Journey Room (Call [place] to register [phone])

## 2016-07-09 NOTE — Progress Notes (Signed)
[name]    HEMATOLOGY/ONCOLOGY CONSULTATION NOTE  Date of Service: 07/09/2016  Patient Care Team: Marjean Donna, MD as PCP - General (Family Medicine)  [name] MD  CHIEF COMPLAINTS/PURPOSE OF CONSULTATION:  Newly diagnosed lung mass with mediastinal adenopathy concerning for lung cancer  HISTORY OF PRESENTING ILLNESS:  Erik [name] is a wonderful 55 y.o. male who has been referred to [place] by Dr [name], MD  for evaluation and management of Newly diagnosed lung mass with mediastinal adenopathy concerning for lung cancer.  Patient has a history of multiple back surgeries leading to chronic back pain issues, depression, active smoker, COPD, polycythemia who had routine labs with his primary care physician and was noted to have abnormal LFTs.  This led to a CT of the abdomen with contrast on 06/17/2016 for workup of his abnormal liver function tests. CT showed a rounded focus of consolidation in the right lower lobe, right middle lobe atelectasis without obstructive lesion and hepatic steatosis with no enhancing hepatic lesion.  Patient subsequently had a PET CT scan on 07/01/2016 which showed a 6.3 x 3.7 cm right lower lobe mass suspicious for primary bronchogenic neoplasm with hypermetabolic thoracic nodal metastases and partial right middle lobe atelectasis. No evidence of distant metastases noted.  Patient was referred to [place] for further evaluation and management. Patient reports that he has a chronic cough due to his COPD and smoking but that over the last month his cough has been deeper and somewhat more productive with yellowish-green phlegm. No significant change in his breathing. No overt hemoptysis. No fevers no chills.  Denies any obvious weight loss.  No headaches or focal neurological deficits.   MEDICAL HISTORY:  Past Medical History:  Diagnosis Date  . Arnold-Chiari syndrome   . Arthritis   . Chronic back pain   . Depression   . GERD (gastroesophageal reflux  disease)   . Seasonal allergies   . Spinal stenosis of lumbar region   . Wheezing   Polycythemia diagnosed with Dr. Tressie Stalker  25 years ago treated with therapeutic phlebotomies and more recently blood donations every 4-6 months .patient has O- blood group but a report and therefore has been a desired blood donor.  smoker COPD- on Symbicort and Ventolin. 5 packs surgeries last one about a year ago Hepatic steatosis  SURGICAL HISTORY: Past Surgical History:  Procedure Laterality Date  . BACK SURGERY    . MULTIPLE EXTRACTIONS WITH ALVEOLOPLASTY N/A 07/08/2014   Procedure: MULTIPLE EXTRACION WITH ALVEOLOPLASTY with EXCISION LESION RIGHT SIDE OF TONGUE;  Surgeon: Gae Bon, DDS;  Location: [place];  Service: Oral Surgery;  Laterality: N/A;  Spinal fusion 2   laminectomy 3 Patient worked in Architect and as a Pharmacist, hospital and notes that he had severe degenerative disc disease.  SOCIAL HISTORY: Social History   Social History  . Marital status: Divorced    Spouse name: N/A  . Number of children: N/A  . Years of education: N/A   Occupational History  . Not on file.   Social History Main Topics  . Smoking status: Current Every Day Smoker    Packs/day: 0.25    Years: 15.00    Types: Cigarettes  . Smokeless tobacco: Former Systems developer    Types: Snuff  . Alcohol use 1.8 oz/week    3 Cans of beer per week     Comment: reports drinking 2-3 beers a week  . Drug use:     Types: Hydrocodone  . Sexual activity: Not Currently  Other Topics Concern  . Not on file   Social History Narrative  . No narrative on file  Cigarette smoking 2 packs per day for 35 years now currently down to 1 pack per day   occasional alcohol use Currently on disability due to his back problems. He previously worked in Multimedia programmer and in Therapist, sports. Lives by himself  FAMILY HISTORY: Family History  Problem Relation Age of Onset  . Cancer Mother   Mother had breast cancer at age 99  .patient reports her genetics for breast cancer syndromes were apparently negative . Dad pancreatic cancer and polycythemia   brother polycythemia Paternal aunt "some sort of cancer"  ALLERGIES:  is allergic to tetracyclines & related.  MEDICATIONS:  Current Outpatient Prescriptions  Medication Sig Dispense Refill  . albuterol (PROVENTIL HFA;VENTOLIN HFA) 108 (90 BASE) MCG/ACT inhaler Inhale 2 puffs into the lungs every 6 (six) hours as needed for wheezing.     . budesonide-formoterol (SYMBICORT) 160-4.5 MCG/ACT inhaler Inhale 2 puffs into the lungs daily.    . cyclobenzaprine (FLEXERIL) 10 MG tablet Take 1 tablet (10 mg total) by mouth 3 (three) times daily as needed for muscle spasms. 21 tablet 0  . gabapentin (NEURONTIN) 300 MG capsule Take 300 mg by mouth 3 (three) times daily.    [name] HYDROcodone-acetaminophen (NORCO) 10-325 MG per tablet Take 1 tablet by mouth every 6 (six) hours as needed for pain. 15 tablet 0  . HYDROcodone-acetaminophen (NORCO) 10-325 MG per tablet Take 1 tablet by mouth every 6 (six) hours as needed. 30 tablet 0  . morphine (KADIAN) 100 MG 24 hr capsule Take 100 mg by mouth 2 (two) times daily.    . sertraline (ZOLOFT) 100 MG tablet Take 150 mg by mouth daily.     . varenicline (CHANTIX) 1 MG tablet Take 1 mg by mouth 2 (two) times daily.     No current facility-administered medications for this visit.     REVIEW OF SYSTEMS:    10 Point review of Systems was done is negative except as noted above.  PHYSICAL EXAMINATION: ECOG PERFORMANCE STATUS: 2 - Symptomatic, <50% confined to bed  . Vitals:   07/09/16 1500  BP: (!) 158/86  Pulse: 89  Resp: 20  Temp: 99.2 F (37.3 C)   Filed Weights   07/09/16 1500  Weight: 269 lb 9.6 oz (122.3 kg)   .Body mass index is 36.56 kg/m.  GENERAL:alert, in no acute distress and comfortable SKIN: skin color, texture, turgor are normal, no rashes or significant lesions EYES: normal, conjunctiva are pink and  non-injected, sclera clear OROPHARYNX:no exudate, no erythema and lips, buccal mucosa, and tongue normal  NECK: supple, no JVD, thyroid normal size, non-tender, without nodularity LYMPH:  no palpable lymphadenopathy in the cervical, axillary or inguinal LUNGS: decreased air entry bilaterally, few scattered rhonchi no rales HEART: regular rate & rhythm,  no murmurs and no lower extremity edema ABDOMEN: abdomen soft, non-tender, normoactive bowel sounds , no palpable hepatosplenomegaly  Musculoskeletal: no cyanosis of digits and no clubbing  PSYCH: alert & oriented x 3 with fluent speech NEURO: no focal motor/sensory deficits  LABORATORY DATA:  I have reviewed the data as listed  . CBC Latest Ref Rng & Units 07/09/2016 07/05/2014  WBC 4.0 - 10.5 K/uL 8.6 11.5(H)  Hemoglobin 13.0 - 17.0 g/dL 15.6 15.2  Hematocrit 39.0 - 52.0 % 47.0 44.9  Platelets 150 - 400 K/uL 177 191    . CMP Latest Ref Rng & Units  07/09/2016 07/05/2014 05/06/2009  Glucose 65 - 99 mg/dL 102(H) 89 120(H)  BUN 6 - 20 mg/dL '9 11 15  '$ Creatinine 0.61 - 1.24 mg/dL 0.51(L) 0.68 0.81  Sodium 135 - 145 mmol/L 135 138 140  Potassium 3.5 - 5.1 mmol/L 3.7 4.0 4.6  Chloride 101 - 111 mmol/L 103 100 106  CO2 22 - 32 mmol/L '27 25 23  '$ Calcium 8.9 - 10.3 mg/dL 8.7(L) 9.5 9.6  Total Protein 6.5 - 8.1 g/dL 8.4(H) - 7.3  Total Bilirubin 0.3 - 1.2 mg/dL 0.7 - 0.3  Alkaline Phos 38 - 126 U/L 105 - 89  AST 15 - 41 U/L 50(H) - 17  ALT 17 - 63 U/L 49 - 19   Component     Latest Ref Rng & Units 07/09/2016  Prothrombin Time     11.4 - 15.2 seconds 15.2  INR      1.19  LDH     98 - 192 U/L 159  APTT     24 - 36 seconds 37 (H)     RADIOGRAPHIC STUDIES: I have personally reviewed the radiological images as listed and agreed with the findings in the report. Ct Abdomen W Contrast  Result Date: 06/17/2016 CLINICAL DATA:  Elevated LFTs. EXAM: CT ABDOMEN WITH CONTRAST TECHNIQUE: Multidetector CT imaging of the abdomen was performed using  the standard protocol following bolus administration of intravenous contrast. CONTRAST:  180m ISOVUE-300 IOPAMIDOL (ISOVUE-300) INJECTION 61% COMPARISON:  None. FINDINGS: Lower chest: Rounded focus consolidation in the RIGHT lower lobe measures 3.7 cm. There is mild ground-glass opacity along the margin of this lesion. This atelectasis in the RIGHT middle lobe, medial segment. Hepatobiliary: Diffuse low-attenuation within liver. No enhancing hepatic lesion. Portal veins are patent. Pancreas: Pancreas is normal. No ductal dilatation. No pancreatic inflammation. Spleen: Normal spleen Adrenals/urinary tract: Adrenal glands kidneys and upper ureters are normal. Stomach/Bowel: Stomach, small bowel, appendix, and cecum are normal. The colon and rectosigmoid colon are normal. Vascular/Lymphatic: Abdominal aorta is normal caliber. There is no retroperitoneal or periportal lymphadenopathy. No pelvic lymphadenopathy. Reproductive: Other: No free fluid. Musculoskeletal: Posterior lumbar fusion. No aggressive osseous lesion. IMPRESSION: 1. Rounded focus of consolidation in the RIGHT lower lobe with differential including the focal pneumonia versus BRONCHOGENIC CARCINOMA. If the patient does not have symptoms of pulmonary infection, recommend FDG PET scan in 1 to 2 weeks. With symptoms of pulmonary infection, recommend follow-up CT in 3-5 weeks to demonstrate regression with therapy. 2. RIGHT middle lobe atelectasis without obstructing lesion. 3. Hepatic steatosis.  No enhancing hepatic lesion. These results will be called to the ordering clinician or representative by the Radiologist Assistant, and communication documented in the PACS or zVision Dashboard. Electronically Signed   By: SSuzy BouchardM.D.   On: 06/17/2016 15:03   Nm Pet Image Initial (pi) Skull Base To Thigh  Result Date: 07/01/2016 CLINICAL DATA:  Initial treatment strategy for right lower lung mass. EXAM: NUCLEAR MEDICINE PET SKULL BASE TO THIGH  TECHNIQUE: 13.0 mCi F-18 FDG was injected intravenously. Full-ring PET imaging was performed from the skull base to thigh after the radiotracer. CT data was obtained and used for attenuation correction and anatomic localization. FASTING BLOOD GLUCOSE:  Value: 128 mg/dl COMPARISON:  CT abdomen dated 06/17/2016. FINDINGS: NECK No hypermetabolic lymph nodes in the neck. CHEST 6.3 x 3.7 cm right lower lobe mass, max SUV 4.1, suspicious for primary bronchogenic neoplasm. Lesion demonstrates 8 peripheral ground-glass opacity with central solid component measuring 3.7 cm. Associated extension to the  pleural surface. Thoracic lymphadenopathy, including: --1.7 cm short axis prevascular node (series 2/ image 69), max SUV 10.0 --1.7 cm short axis right paratracheal node (series 4/ image 73), max SUV 9.8 --1.9 cm short axis AP window node (series 4/image 34), max SUV 7.3 --2.2 cm short axis subcarinal node (series 4/ image 81), max SUV 7.6 Mild right perihilar hypermetabolism, max SUV 6.0. Associated right middle lobe atelectasis/collapse. ABDOMEN/PELVIS No abnormal hypermetabolic activity within the liver, pancreas, adrenal glands, or spleen. Hepatic steatosis. Small upper abdominal lymph nodes measuring up to 1.8 cm short axis (series 4/image 131), without hypermetabolism. No hypermetabolic lymph nodes in the abdomen or pelvis. SKELETON Postsurgical changes involving the lumbar spine. No focal hypermetabolic activity to suggest skeletal metastasis. IMPRESSION: 6.3 x 3.7 cm right lower lobe mass, suspicious for primary bronchogenic neoplasm, as described above. Hypermetabolic thoracic nodal metastases, as above. Additional right perihilar hypermetabolism, indeterminate. Associated right middle lobe atelectasis/collapse. Electronically Signed   By: Julian Hy M.D.   On: 07/01/2016 12:18   ASSESSMENT & PLAN:   55 year old Caucasian male with   #1 newly diagnosed likely non-small cell lung cancer. At least appears  to be stage IIIA possibly stage IIIB (likely N3 disease). No distant metastatic disease noted on PET CT scan. #2 COPD #3 active smoker one pack per day currently taking Chantix currently and has been trying to cut down. Previously smoked 2 packs per day for 35 years. Plan -We'll refer the patient to Dr. Roxan Hockey cardiothoracic surgery for biopsy for tissue diagnosis (bronchoscopically. Appears to have central disease) -Based on surgeons review of imaging he may need cervico-mediastinal LN staging if he is being considered as a potential surgical candidate . -MRI brain to complete his staging workup . -Will need to plan further treatment based on tissue diagnosis. If this is non-small cell lung cancer and found to be bulky N2 or N3 disease will pursue concurrent chemoradiation. Patient is not oxygen dependent at this time. -Pulmonary function testing to determine baseline lung function. -Counseled extensively on smoking cessation. Patient is currently on Chantix and cutting down his smoking.   #4 history of polycythemia -uncertain if this is truly polycythemia vera or secondary polycythemia due to his smoking. He reports a family history of polycythemia and his brother and father -this can sometimes happen with high at 100 hemoglobinopathy. Plan  -he has been donating blood since he is O rh neg and to keep his polycythemia stable. - given his new diagnosis of likely malignancy he has been recommended not to donate blood. -We'll send her to jak2 [id number] mutation and if neg Jak2 Exon 12 to check for polycythemia vera . -Currently the patient does not need a therapeutic phlebotomy.  #5 minimal abnormal LFTs- no imaging evidence of metastasis. Could be from his hepatic steatosis.    Return to care with our medical oncology clinic in 2 weeks with tissue diagnosis , input by Dr. Roxan Hockey for final treatment plan    All of the patients questions were answered to his apparent satisfaction. The  patient knows to call the clinic with any problems, questions or concerns.  I spent 60 minutes counseling the patient face to face. The total time spent in the appointment was 70 minutes and more than 50% was on counseling and direct patient cares.    Sullivan Lone MD [place] AAHIVMS [facility] Hematology/Oncology Physician [facility]  (Office):       [phone] (Work cell):  [phone] (Fax):  [phone]  07/09/2016 3:52 PM

## 2016-07-15 ENCOUNTER — Encounter: Payer: Self-pay | Admitting: Thoracic Surgery (Cardiothoracic Vascular Surgery)

## 2016-07-15 ENCOUNTER — Institutional Professional Consult (permissible substitution) (INDEPENDENT_AMBULATORY_CARE_PROVIDER_SITE_OTHER): Payer: Medicare Other | Admitting: Thoracic Surgery (Cardiothoracic Vascular Surgery)

## 2016-07-15 ENCOUNTER — Other Ambulatory Visit: Payer: Self-pay | Admitting: *Deleted

## 2016-07-15 VITALS — BP 150/80 | HR 96 | Resp 20 | Ht 72.0 in | Wt 269.0 lb

## 2016-07-15 DIAGNOSIS — R918 Other nonspecific abnormal finding of lung field: Secondary | ICD-10-CM | POA: Diagnosis not present

## 2016-07-15 DIAGNOSIS — R590 Localized enlarged lymph nodes: Secondary | ICD-10-CM

## 2016-07-15 DIAGNOSIS — R599 Enlarged lymph nodes, unspecified: Secondary | ICD-10-CM

## 2016-07-15 NOTE — Progress Notes (Signed)
PCP is [name], MD Referring Provider is Brunetta Genera, MD  Chief Complaint  Patient presents with  . Lung Mass    Surgical eval on 6.3 x 3.7 cm right lower lobe mass,PET Scan 07/01/16    HPI: Mr. Murrell is a 55 year old man sent for consultation regarding right lung mass with mediastinal adenopathy.  Mr. Muff is a 55 year old man with a history of tobacco abuse (2 packs per day from age 71, currently less than 1 pack a day), COPD, Arnold-Chiari syndrome, multiple back surgeries with chronic back pain, arthritis, reflux, and depression. He recently went for a routine medical exam. Laboratory testing noted abnormal transaminases and a CT of the abdomen was performed. It showed hepatic steatosis, but more importantly showed a right lower lobe lung mass and right middle lobe atelectasis. A PET CT was done which showed a right hilar mass, right lower lobe mass, and extensive bilateral mediastinal adenopathy. These areas were markedly hypermetabolic. There was no evidence of distant metastases.  He complains of shortness of breath with exertion. He has a frequent cough usually productive of yellow sputum but with occasional small flecks of blood. He has frequent wheezing, although he is noted the severity of his wheezing has improved over the past several weeks. He denies weight loss and in fact has gained 10 pounds in the past 3 weeks. He says he does intermittently note his voice is hoarse. He denies any unusual headaches or visual changes. He takes methadone for chronic back pain.  Zubrod Score: At the time of surgery this patient's most appropriate activity status/level should be described as: '[]'$     0    Normal activity, no symptoms '[]'$     1    Restricted in physical strenuous activity but ambulatory, able to do out light work '[x]'$     2    Ambulatory and capable of self care, unable to do work activities, up and about >50 % of waking hours                              '[]'$     3    Only  limited self care, in bed greater than 50% of waking hours '[]'$     4    Completely disabled, no self care, confined to bed or chair '[]'$     5    Moribund    Past Medical History:  Diagnosis Date  . Arnold-Chiari syndrome ([place])   . Arthritis   . Chronic back pain   . Depression   . GERD (gastroesophageal reflux disease)   . Seasonal allergies   . Spinal stenosis of lumbar region   . Wheezing     Past Surgical History:  Procedure Laterality Date  . BACK SURGERY    . MULTIPLE EXTRACTIONS WITH ALVEOLOPLASTY N/A 07/08/2014   Procedure: MULTIPLE EXTRACION WITH ALVEOLOPLASTY with EXCISION LESION RIGHT SIDE OF TONGUE;  Surgeon: Gae Bon, DDS;  Location: [place];  Service: Oral Surgery;  Laterality: N/A;    Family History  Problem Relation Age of Onset  . Cancer Mother     Social History Social History  Substance Use Topics  . Smoking status: Current Every Day Smoker    Packs/day: 2.00    Years: 38.00    Types: Cigarettes  . Smokeless tobacco: Former Systems developer    Types: Snuff     Comment: currently < 1 ppd  . Alcohol use 1.8 oz/week  3 Cans of beer per week     Comment: reports drinking 2-3 beers a week    Current Outpatient Prescriptions  Medication Sig Dispense Refill  . albuterol (PROVENTIL HFA;VENTOLIN HFA) 108 (90 BASE) MCG/ACT inhaler Inhale 2 puffs into the lungs every 6 (six) hours as needed for wheezing.     [name] ALPRAZolam (XANAX) 1 MG tablet     . amLODipine (NORVASC) 5 MG tablet     . budesonide-formoterol (SYMBICORT) 160-4.5 MCG/ACT inhaler Inhale 2 puffs into the lungs daily.    [name] escitalopram (LEXAPRO) 20 MG tablet     . gabapentin (NEURONTIN) 300 MG capsule Take 300 mg by mouth 3 (three) times daily.    . methadone (DOLOPHINE) 10 MG tablet 80 mg.    . tiZANidine (ZANAFLEX) 4 MG tablet     . varenicline (CHANTIX) 1 MG tablet Take 1 mg by mouth 2 (two) times daily.     No current facility-administered medications for this visit.     Allergies  Allergen  Reactions  . Tetracyclines & Related     Review of Systems  Constitutional: Positive for activity change, fatigue and unexpected weight change (has gained 10 pounds in 3 months). Negative for appetite change, chills and fever.  HENT: Positive for voice change. Negative for trouble swallowing.   Eyes: Negative for visual disturbance.  Respiratory: Positive for cough (Small amount of hemoptysis), chest tightness, shortness of breath and wheezing.   Cardiovascular: Negative for chest pain.  Gastrointestinal: Negative for blood in stool.       Heartburn  Endocrine: Negative for polydipsia, polyphagia and polyuria.  Genitourinary: Positive for frequency. Negative for dysuria.  Musculoskeletal: Positive for arthralgias, back pain and gait problem.       Chronic pain on methadone  Neurological: Negative for syncope and weakness.  Hematological: Negative for adenopathy. Does not bruise/bleed easily.  All other systems reviewed and are negative.   BP (!) 150/80 (BP Location: Right Arm, Patient Position: Sitting, Cuff Size: Large)   Pulse 96   Resp 20   Ht 6' (1.829 m)   Wt 269 lb (122 kg)   SpO2 98% Comment: RA  BMI 36.48 kg/m  Physical Exam  Constitutional: He is oriented to person, place, and time.  Obese  HENT:  Head: Normocephalic and atraumatic.  Eyes: EOM are normal. Pupils are equal, round, and reactive to light. No scleral icterus.  Neck: Neck supple. No thyromegaly present.  Cardiovascular: Normal rate, regular rhythm, normal heart sounds and intact distal pulses.   No murmur heard. Pulmonary/Chest: Effort normal. He has no wheezes. He has no rales.  Egophony right base  Abdominal: Soft. He exhibits no distension. There is no tenderness.  Musculoskeletal: He exhibits no edema.  Lymphadenopathy:    He has no cervical adenopathy.  Neurological: He is alert and oriented to person, place, and time. No cranial nerve deficit. He exhibits normal muscle tone.  Skin: Skin is warm  and dry.  Vitals reviewed.    Diagnostic Tests: NUCLEAR MEDICINE PET SKULL BASE TO THIGH TECHNIQUE: 13.0 mCi F-18 FDG was injected intravenously. Full-ring PET imaging was performed from the skull base to thigh after the radiotracer. CT data was obtained and used for attenuation correction and anatomic localization. FASTING BLOOD GLUCOSE:  Value: 128 mg/dl COMPARISON:  CT abdomen dated 06/17/2016. FINDINGS: NECK No hypermetabolic lymph nodes in the neck. CHEST 6.3 x 3.7 cm right lower lobe mass, max SUV 4.1, suspicious for primary bronchogenic neoplasm. Lesion demonstrates 8 peripheral  ground-glass opacity with central solid component measuring 3.7 cm. Associated extension to the pleural surface. Thoracic lymphadenopathy, including: --1.7 cm short axis prevascular node (series 2/ image 69), max SUV 10.0 --1.7 cm short axis right paratracheal node (series 4/ image 73), max SUV 9.8 --1.9 cm short axis AP window node (series 4/image 34), max SUV 7.3 --2.2 cm short axis subcarinal node (series 4/ image 81), max SUV 7.6 Mild right perihilar hypermetabolism, max SUV 6.0. Associated right middle lobe atelectasis/collapse. ABDOMEN/PELVIS No abnormal hypermetabolic activity within the liver, pancreas, adrenal glands, or spleen. Hepatic steatosis. Small upper abdominal lymph nodes measuring up to 1.8 cm short axis (series 4/image 131), without hypermetabolism. No hypermetabolic lymph nodes in the abdomen or pelvis. SKELETON Postsurgical changes involving the lumbar spine. No focal hypermetabolic activity to suggest skeletal metastasis. IMPRESSION: 6.3 x 3.7 cm right lower lobe mass, suspicious for primary bronchogenic neoplasm, as described above. Hypermetabolic thoracic nodal metastases, as above. Additional right perihilar hypermetabolism, indeterminate. Associated right middle lobe atelectasis/collapse. Electronically Signed   By: Julian Hy M.D.   On: 07/01/2016  12:18 I personally reviewed the PET/CT [place] sizes noted above. Findings consistent with stage IIIB lung cancer  Impression: Mr. Frenkel is a 55 year old man with a history of tobacco abuse who has a right hilar mass, right lower lobe mass, and extensive mediastinal adenopathy. This is almost certainly a new primary bronchogenic carcinoma. The primary differential to be made is to determine whether it is small cell or non-small cell. Secondarily to determine the subtype non-small cell. Regardless of which type it is it is not surgically resectable.  I recommended to him that he undergo bronchoscopy and endobronchial ultrasound for diagnostic purposes. We would do this with him in the operating room so that we could ensure ability to get adequate samples. We will do our best to obtain adequate material for molecular testing should this turn out to be non-small cell. I described the procedure to him. He understands the need for general anesthesia and the likelihood of success (85%) and making a diagnosis. I reviewed the indications, risks, benefits, and alternatives. He understands the risks include those associated with general anesthesia and include, but are not limited to death, MI, DVT, PE, bleeding, pneumothorax, and failure to make a diagnosis.  He understands and accepts the risks and agrees to proceed.  Plan: Bronchoscopy and endobronchial ultrasound on Monday, 07/19/2016.  [name], MD [facility] [phone]

## 2016-07-16 ENCOUNTER — Ambulatory Visit (HOSPITAL_COMMUNITY)
Admission: RE | Admit: 2016-07-16 | Discharge: 2016-07-16 | Disposition: A | Discharge status: Home / Self Care | Payer: Medicare Other | Source: Ambulatory Visit | Attending: Hematology | Admitting: Hematology

## 2016-07-16 DIAGNOSIS — J988 Other specified respiratory disorders: Secondary | ICD-10-CM | POA: Diagnosis not present

## 2016-07-16 DIAGNOSIS — Z01818 Encounter for other preprocedural examination: Secondary | ICD-10-CM | POA: Insufficient documentation

## 2016-07-16 DIAGNOSIS — R599 Enlarged lymph nodes, unspecified: Secondary | ICD-10-CM | POA: Insufficient documentation

## 2016-07-16 DIAGNOSIS — R918 Other nonspecific abnormal finding of lung field: Secondary | ICD-10-CM

## 2016-07-16 DIAGNOSIS — J984 Other disorders of lung: Secondary | ICD-10-CM | POA: Insufficient documentation

## 2016-07-16 DIAGNOSIS — R590 Localized enlarged lymph nodes: Secondary | ICD-10-CM

## 2016-07-16 LAB — PULMONARY FUNCTION TEST
DL/VA % pred: 89 %
DL/VA: 4.24 ml/min/mmHg/L
DLCO unc % pred: 80 %
DLCO unc: 28.24 ml/min/mmHg
FEF 25-75 Post: 2.01 L/sec
FEF 25-75 Pre: 1.17 L/sec
FEF2575-%CHANGE-POST: 72 %
FEF2575-%PRED-PRE: 33 %
FEF2575-%Pred-Post: 58 %
FEV1-%Change-Post: 15 %
FEV1-%PRED-POST: 72 %
FEV1-%Pred-Pre: 62 %
FEV1-Post: 2.93 L
FEV1-Pre: 2.53 L
FEV1FVC-%CHANGE-POST: 4 %
FEV1FVC-%Pred-Pre: 73 %
FEV6-%Change-Post: 11 %
FEV6-%PRED-PRE: 81 %
FEV6-%Pred-Post: 91 %
FEV6-POST: 4.65 L
FEV6-PRE: 4.15 L
FEV6FVC-%Change-Post: 1 %
FEV6FVC-%PRED-POST: 98 %
FEV6FVC-%PRED-PRE: 97 %
FVC-%CHANGE-POST: 10 %
FVC-%PRED-PRE: 84 %
FVC-%Pred-Post: 93 %
FVC-POST: 4.93 L
FVC-PRE: 4.46 L
POST FEV6/FVC RATIO: 94 %
PRE FEV1/FVC RATIO: 57 %
Post FEV1/FVC ratio: 59 %
Pre FEV6/FVC Ratio: 93 %
RV % PRED: 198 %
RV: 4.41 L
TLC % PRED: 123 %
TLC: 9.08 L

## 2016-07-16 MED ORDER — ALBUTEROL SULFATE (2.5 MG/3ML) 0.083% IN NEBU
2.5000 mg | INHALATION_SOLUTION | Freq: Once | RESPIRATORY_TRACT | Status: AC
  Administered 2016-07-16: 2.5 mg via RESPIRATORY_TRACT

## 2016-07-19 ENCOUNTER — Ambulatory Visit (HOSPITAL_COMMUNITY): Payer: Medicare Other

## 2016-07-19 ENCOUNTER — Ambulatory Visit (HOSPITAL_COMMUNITY): Payer: Medicare Other | Admitting: Certified Registered Nurse Anesthetist

## 2016-07-19 ENCOUNTER — Ambulatory Visit (HOSPITAL_COMMUNITY)
Admission: RE | Admit: 2016-07-19 | Discharge: 2016-07-19 | Disposition: A | Discharge status: Home / Self Care | Payer: Medicare Other | Source: Ambulatory Visit | Attending: Thoracic Surgery (Cardiothoracic Vascular Surgery) | Admitting: Thoracic Surgery (Cardiothoracic Vascular Surgery)

## 2016-07-19 ENCOUNTER — Encounter (HOSPITAL_COMMUNITY)
Admission: RE | Disposition: A | Discharge status: Home / Self Care | Payer: Self-pay | Source: Ambulatory Visit | Attending: Thoracic Surgery (Cardiothoracic Vascular Surgery)

## 2016-07-19 DIAGNOSIS — M1990 Unspecified osteoarthritis, unspecified site: Secondary | ICD-10-CM | POA: Diagnosis not present

## 2016-07-19 DIAGNOSIS — F329 Major depressive disorder, single episode, unspecified: Secondary | ICD-10-CM | POA: Insufficient documentation

## 2016-07-19 DIAGNOSIS — C3431 Malignant neoplasm of lower lobe, right bronchus or lung: Secondary | ICD-10-CM | POA: Insufficient documentation

## 2016-07-19 DIAGNOSIS — K219 Gastro-esophageal reflux disease without esophagitis: Secondary | ICD-10-CM | POA: Diagnosis not present

## 2016-07-19 DIAGNOSIS — Z79899 Other long term (current) drug therapy: Secondary | ICD-10-CM | POA: Insufficient documentation

## 2016-07-19 DIAGNOSIS — J449 Chronic obstructive pulmonary disease, unspecified: Secondary | ICD-10-CM | POA: Insufficient documentation

## 2016-07-19 DIAGNOSIS — R590 Localized enlarged lymph nodes: Secondary | ICD-10-CM

## 2016-07-19 DIAGNOSIS — G8929 Other chronic pain: Secondary | ICD-10-CM | POA: Diagnosis not present

## 2016-07-19 DIAGNOSIS — F17210 Nicotine dependence, cigarettes, uncomplicated: Secondary | ICD-10-CM | POA: Insufficient documentation

## 2016-07-19 DIAGNOSIS — M549 Dorsalgia, unspecified: Secondary | ICD-10-CM | POA: Insufficient documentation

## 2016-07-19 DIAGNOSIS — Z79891 Long term (current) use of opiate analgesic: Secondary | ICD-10-CM | POA: Diagnosis not present

## 2016-07-19 DIAGNOSIS — R918 Other nonspecific abnormal finding of lung field: Secondary | ICD-10-CM

## 2016-07-19 HISTORY — PX: VIDEO BRONCHOSCOPY WITH ENDOBRONCHIAL ULTRASOUND: SHX6177

## 2016-07-19 LAB — COMPREHENSIVE METABOLIC PANEL
ALK PHOS: 100 U/L (ref 38–126)
ALT: 45 U/L (ref 17–63)
AST: 43 U/L — ABNORMAL HIGH (ref 15–41)
Albumin: 3.8 g/dL (ref 3.5–5.0)
Anion gap: 9 (ref 5–15)
BUN: 11 mg/dL (ref 6–20)
CALCIUM: 9.4 mg/dL (ref 8.9–10.3)
CHLORIDE: 104 mmol/L (ref 101–111)
CO2: 26 mmol/L (ref 22–32)
CREATININE: 0.71 mg/dL (ref 0.61–1.24)
Glucose, Bld: 118 mg/dL — ABNORMAL HIGH (ref 65–99)
Potassium: 4 mmol/L (ref 3.5–5.1)
Sodium: 139 mmol/L (ref 135–145)
Total Bilirubin: 0.7 mg/dL (ref 0.3–1.2)
Total Protein: 7.3 g/dL (ref 6.5–8.1)

## 2016-07-19 LAB — CBC
HCT: 46.7 % (ref 39.0–52.0)
Hemoglobin: 15.4 g/dL (ref 13.0–17.0)
MCH: 33.3 pg (ref 26.0–34.0)
MCHC: 33 g/dL (ref 30.0–36.0)
MCV: 101.1 fL — AB (ref 78.0–100.0)
PLATELETS: 172 10*3/uL (ref 150–400)
RBC: 4.62 MIL/uL (ref 4.22–5.81)
RDW: 13.7 % (ref 11.5–15.5)
WBC: 7.8 10*3/uL (ref 4.0–10.5)

## 2016-07-19 LAB — BLOOD GAS, ARTERIAL
ACID-BASE EXCESS: 1.4 mmol/L (ref 0.0–2.0)
BICARBONATE: 27.4 meq/L — AB (ref 20.0–24.0)
Drawn by: 44898
FIO2: 40
O2 Saturation: 95.4 %
PATIENT TEMPERATURE: 98.6
PCO2 ART: 58.7 mmHg — AB (ref 35.0–45.0)
PEEP: 5 cmH2O
PH ART: 7.29 — AB (ref 7.350–7.450)
PO2 ART: 87.7 mmHg (ref 80.0–100.0)
PRESSURE SUPPORT: 5 cmH2O
TCO2: 29.2 mmol/L (ref 0–100)

## 2016-07-19 LAB — PROTIME-INR
INR: 1.22
PROTHROMBIN TIME: 15.5 s — AB (ref 11.4–15.2)

## 2016-07-19 LAB — APTT: aPTT: 37 seconds — ABNORMAL HIGH (ref 24–36)

## 2016-07-19 SURGERY — BRONCHOSCOPY, WITH EBUS
Anesthesia: General

## 2016-07-19 MED ORDER — ONDANSETRON HCL 4 MG/2ML IJ SOLN
INTRAMUSCULAR | Status: DC | PRN
  Administered 2016-07-19: 4 mg via INTRAVENOUS

## 2016-07-19 MED ORDER — SUGAMMADEX SODIUM 500 MG/5ML IV SOLN
INTRAVENOUS | Status: AC
  Filled 2016-07-19: qty 5

## 2016-07-19 MED ORDER — ALBUTEROL SULFATE HFA 108 (90 BASE) MCG/ACT IN AERS
INHALATION_SPRAY | RESPIRATORY_TRACT | Status: DC | PRN
  Administered 2016-07-19 (×3): 6 via RESPIRATORY_TRACT

## 2016-07-19 MED ORDER — PROPOFOL 10 MG/ML IV BOLUS
INTRAVENOUS | Status: AC
  Filled 2016-07-19: qty 20

## 2016-07-19 MED ORDER — FENTANYL CITRATE (PF) 100 MCG/2ML IJ SOLN
INTRAMUSCULAR | Status: DC | PRN
Start: 2016-07-19 — End: 2016-07-19
  Administered 2016-07-19: 50 ug via INTRAVENOUS
  Administered 2016-07-19: 100 ug via INTRAVENOUS
  Administered 2016-07-19 (×2): 50 ug via INTRAVENOUS

## 2016-07-19 MED ORDER — LACTATED RINGERS IV SOLN
INTRAVENOUS | Status: DC | PRN
  Administered 2016-07-19 (×2): via INTRAVENOUS

## 2016-07-19 MED ORDER — ALBUTEROL SULFATE HFA 108 (90 BASE) MCG/ACT IN AERS
INHALATION_SPRAY | RESPIRATORY_TRACT | Status: AC
  Filled 2016-07-19: qty 6.7

## 2016-07-19 MED ORDER — DEXAMETHASONE SODIUM PHOSPHATE 10 MG/ML IJ SOLN
INTRAMUSCULAR | Status: DC | PRN
  Administered 2016-07-19: 10 mg via INTRAVENOUS

## 2016-07-19 MED ORDER — ONDANSETRON HCL 4 MG/2ML IJ SOLN
INTRAMUSCULAR | Status: AC
  Filled 2016-07-19: qty 2

## 2016-07-19 MED ORDER — ROCURONIUM BROMIDE 100 MG/10ML IV SOLN
INTRAVENOUS | Status: DC | PRN
  Administered 2016-07-19: 20 mg via INTRAVENOUS
  Administered 2016-07-19: 30 mg via INTRAVENOUS
  Administered 2016-07-19: 50 mg via INTRAVENOUS

## 2016-07-19 MED ORDER — DEXMEDETOMIDINE HCL IN NACL 200 MCG/50ML IV SOLN
INTRAVENOUS | Status: DC | PRN
  Administered 2016-07-19: .7 ug/kg/h via INTRAVENOUS

## 2016-07-19 MED ORDER — LIDOCAINE HCL (CARDIAC) 20 MG/ML IV SOLN
INTRAVENOUS | Status: DC | PRN
  Administered 2016-07-19: 100 mg via INTRAVENOUS

## 2016-07-19 MED ORDER — MIDAZOLAM HCL 2 MG/2ML IJ SOLN
INTRAMUSCULAR | Status: AC
  Filled 2016-07-19: qty 2

## 2016-07-19 MED ORDER — DEXAMETHASONE SODIUM PHOSPHATE 10 MG/ML IJ SOLN
INTRAMUSCULAR | Status: AC
  Filled 2016-07-19: qty 1

## 2016-07-19 MED ORDER — SUCCINYLCHOLINE CHLORIDE 20 MG/ML IJ SOLN
INTRAMUSCULAR | Status: DC | PRN
  Administered 2016-07-19: 140 mg via INTRAVENOUS

## 2016-07-19 MED ORDER — DEXMEDETOMIDINE HCL IN NACL 200 MCG/50ML IV SOLN
INTRAVENOUS | Status: DC | PRN
  Administered 2016-07-19: 10 ug via INTRAVENOUS

## 2016-07-19 MED ORDER — LACTATED RINGERS IV SOLN
Freq: Once | INTRAVENOUS | Status: AC
  Administered 2016-07-19: 11:00:00 via INTRAVENOUS

## 2016-07-19 MED ORDER — PROMETHAZINE HCL 25 MG/ML IJ SOLN: 6.2500 mg | INTRAMUSCULAR | Status: DC | PRN

## 2016-07-19 MED ORDER — SUGAMMADEX SODIUM 500 MG/5ML IV SOLN
INTRAVENOUS | Status: DC | PRN
  Administered 2016-07-19: 300 mg via INTRAVENOUS

## 2016-07-19 MED ORDER — FENTANYL CITRATE (PF) 250 MCG/5ML IJ SOLN
INTRAMUSCULAR | Status: AC
Start: 2016-07-19 — End: 2016-07-19
  Filled 2016-07-19: qty 5

## 2016-07-19 MED ORDER — PROPOFOL 1000 MG/100ML IV EMUL
INTRAVENOUS | Status: AC
  Filled 2016-07-19: qty 100

## 2016-07-19 MED ORDER — SUCCINYLCHOLINE 20MG/ML (10ML) SYRINGE FOR MEDFUSION PUMP - OPTIME: INTRAMUSCULAR | Status: DC | PRN

## 2016-07-19 MED ORDER — PROPOFOL 10 MG/ML IV BOLUS
INTRAVENOUS | Status: DC | PRN
  Administered 2016-07-19: 200 mg via INTRAVENOUS
  Administered 2016-07-19 (×2): 50 mg via INTRAVENOUS

## 2016-07-19 MED ORDER — PHENYLEPHRINE HCL 10 MG/ML IJ SOLN
INTRAMUSCULAR | Status: DC | PRN
  Administered 2016-07-19: 80 ug via INTRAVENOUS
  Administered 2016-07-19: 40 ug via INTRAVENOUS

## 2016-07-19 MED ORDER — FENTANYL CITRATE (PF) 250 MCG/5ML IJ SOLN
INTRAMUSCULAR | Status: AC
  Filled 2016-07-19: qty 5

## 2016-07-19 MED ORDER — MIDAZOLAM HCL 5 MG/5ML IJ SOLN
INTRAMUSCULAR | Status: DC | PRN
  Administered 2016-07-19: 2 mg via INTRAVENOUS

## 2016-07-19 MED ORDER — FENTANYL CITRATE (PF) 100 MCG/2ML IJ SOLN: 25.0000 ug | INTRAMUSCULAR | Status: DC | PRN

## 2016-07-19 MED ORDER — EPINEPHRINE HCL 1 MG/ML IJ SOLN
INTRAMUSCULAR | Status: DC | PRN
  Administered 2016-07-19: 1 mg via ENDOTRACHEOPULMONARY

## 2016-07-19 SURGICAL SUPPLY — 32 items
BRUSH CYTOL CELLEBRITY 1.5X140 (MISCELLANEOUS) ×3 IMPLANT
CANISTER SUCTION 2500CC (MISCELLANEOUS) ×3 IMPLANT
CONT SPEC 4OZ CLIKSEAL STRL BL (MISCELLANEOUS) ×3 IMPLANT
COTTONBALL LRG STERILE PKG (GAUZE/BANDAGES/DRESSINGS) IMPLANT
COVER DOME SNAP 22 D (MISCELLANEOUS) ×3 IMPLANT
COVER TABLE BACK 60X90 (DRAPES) ×3 IMPLANT
FILTER STRAW FLUID ASPIR (MISCELLANEOUS) IMPLANT
FORCEPS BIOP RJ4 1.8 (CUTTING FORCEPS) ×3 IMPLANT
GAUZE SPONGE 4X4 12PLY STRL (GAUZE/BANDAGES/DRESSINGS) IMPLANT
GLOVE SURG SIGNA 7.5 PF LTX (GLOVE) ×3 IMPLANT
GOWN STRL REUS W/ TWL XL LVL3 (GOWN DISPOSABLE) ×1 IMPLANT
GOWN STRL REUS W/TWL XL LVL3 (GOWN DISPOSABLE) ×2
KIT CLEAN ENDO COMPLIANCE (KITS) ×6 IMPLANT
KIT ROOM TURNOVER OR (KITS) ×3 IMPLANT
MARKER SKIN DUAL TIP RULER LAB (MISCELLANEOUS) ×3 IMPLANT
NEEDLE 22X1 1/2 (OR ONLY) (NEEDLE) IMPLANT
NEEDLE BIOPSY TRANSBRONCH 21G (NEEDLE) IMPLANT
NEEDLE BLUNT 18X1 FOR OR ONLY (NEEDLE) IMPLANT
NEEDLE EBUS SONO TIP PENTAX (NEEDLE) ×6 IMPLANT
NS IRRIG 1000ML POUR BTL (IV SOLUTION) ×3 IMPLANT
OIL SILICONE PENTAX (PARTS (SERVICE/REPAIRS)) IMPLANT
PAD ARMBOARD 7.5X6 YLW CONV (MISCELLANEOUS) ×6 IMPLANT
SYR 20CC LL (SYRINGE) ×3 IMPLANT
SYR 20ML ECCENTRIC (SYRINGE) ×3 IMPLANT
SYR 5ML LL (SYRINGE) ×3 IMPLANT
SYR 5ML LUER SLIP (SYRINGE) ×3 IMPLANT
SYR CONTROL 10ML LL (SYRINGE) IMPLANT
TOWEL OR 17X24 6PK STRL BLUE (TOWEL DISPOSABLE) ×3 IMPLANT
TRAP SPECIMEN MUCOUS 40CC (MISCELLANEOUS) ×3 IMPLANT
TUBE CONNECTING 20'X1/4 (TUBING) ×1
TUBE CONNECTING 20X1/4 (TUBING) ×2 IMPLANT
WATER STERILE IRR 1000ML POUR (IV SOLUTION) ×3 IMPLANT

## 2016-07-19 NOTE — Discharge Instructions (Addendum)
Do not drive or engage in heavy physical activity for 24 hours  You may resume normal activities tomorrow  You may cough up small amounts of blood over the next few days. Call if you cough up more than 2 tablespoons of blood.  You may use over the counter cough medications and/ or pain reliever (Tylenol) if needed  Call [phone] if you develop chest pain, shortness of breath, fever > 101 F or cough up large amounts of blood.  Follow up with Dr. Irene Limbo as scheduled

## 2016-07-19 NOTE — Anesthesia Postprocedure Evaluation (Signed)
Anesthesia Post Note  Patient: Erik [name]  Procedure(s) Performed: Procedure(s) (LRB): VIDEO BRONCHOSCOPY WITH ENDOBRONCHIAL ULTRASOUND (N/A)  Patient location during evaluation: PACU Anesthesia Type: General Level of consciousness: awake and alert Pain management: pain level controlled Vital Signs Assessment: post-procedure vital signs reviewed and stable Respiratory status: spontaneous breathing, nonlabored ventilation and respiratory function stable Cardiovascular status: blood pressure returned to baseline and stable Postop Assessment: no signs of nausea or vomiting Anesthetic complications: no Comments: Patient was taken to PACU intubated on ventilator due to hypercarbia and respiratory acidosis. His vent settings were weaned and repeat ABG showed improvement in his acidosis and PaCO2. The patient had tidal volumes >1L on PS 5/5 and 40% FiO2, a regular rate, and was following all commands. The ETT was suctioned and the patient was extubated without complication with anesthesia MD at bedside. The patient continued to be observed until appropriate for discharge.    Last Vitals:  Vitals:   07/19/16 1706 07/19/16 1715  BP: 123/88   Pulse: 74 73  Resp: 12 13  Temp:      Last Pain:  Vitals:   07/19/16 1040  TempSrc:   PainSc: 0-No pain                 Nilda Simmer

## 2016-07-19 NOTE — H&P (View-Only) (Signed)
PCP is [name], MD Referring Provider is Brunetta Genera, MD  Chief Complaint  Patient presents with  . Lung Mass    Surgical eval on 6.3 x 3.7 cm right lower lobe mass,PET Scan 07/01/16    HPI: [name] is a 55 year old man sent for consultation regarding right lung mass with mediastinal adenopathy.  [name] is a 55 year old man with a history of tobacco abuse (2 packs per day from age 10, currently less than 1 pack a day), COPD, Arnold-Chiari syndrome, multiple back surgeries with chronic back pain, arthritis, reflux, and depression. He recently went for a routine medical exam. Laboratory testing noted abnormal transaminases and a CT of the abdomen was performed. It showed hepatic steatosis, but more importantly showed a right lower lobe lung mass and right middle lobe atelectasis. A PET CT was done which showed a right hilar mass, right lower lobe mass, and extensive bilateral mediastinal adenopathy. These areas were markedly hypermetabolic. There was no evidence of distant metastases.  He complains of shortness of breath with exertion. He has a frequent cough usually productive of yellow sputum but with occasional small flecks of blood. He has frequent wheezing, although he is noted the severity of his wheezing has improved over the past several weeks. He denies weight loss and in fact has gained 10 pounds in the past 3 weeks. He says he does intermittently note his voice is hoarse. He denies any unusual headaches or visual changes. He takes methadone for chronic back pain.  Zubrod Score: At the time of surgery this patient's most appropriate activity status/level should be described as: '[]'$     0    Normal activity, no symptoms '[]'$     1    Restricted in physical strenuous activity but ambulatory, able to do out light work '[x]'$     2    Ambulatory and capable of self care, unable to do work activities, up and about >50 % of waking hours                              '[]'$     3    Only  limited self care, in bed greater than 50% of waking hours '[]'$     4    Completely disabled, no self care, confined to bed or chair '[]'$     5    Moribund    Past Medical History:  Diagnosis Date  . Arnold-Chiari syndrome ([place])   . Arthritis   . Chronic back pain   . Depression   . GERD (gastroesophageal reflux disease)   . Seasonal allergies   . Spinal stenosis of lumbar region   . Wheezing     Past Surgical History:  Procedure Laterality Date  . BACK SURGERY    . MULTIPLE EXTRACTIONS WITH ALVEOLOPLASTY N/A 07/08/2014   Procedure: MULTIPLE EXTRACION WITH ALVEOLOPLASTY with EXCISION LESION RIGHT SIDE OF TONGUE;  Surgeon: Gae Bon, DDS;  Location: [facility];  Service: Oral Surgery;  Laterality: N/A;    Family History  Problem Relation Age of Onset  . Cancer Mother     Social History Social History  Substance Use Topics  . Smoking status: Current Every Day Smoker    Packs/day: 2.00    Years: 38.00    Types: Cigarettes  . Smokeless tobacco: Former Systems developer    Types: Snuff     Comment: currently < 1 ppd  . Alcohol use 1.8 oz/week  3 Cans of beer per week     Comment: reports drinking 2-3 beers a week    Current Outpatient Prescriptions  Medication Sig Dispense Refill  . albuterol (PROVENTIL HFA;VENTOLIN HFA) 108 (90 BASE) MCG/ACT inhaler Inhale 2 puffs into the lungs every 6 (six) hours as needed for wheezing.     [name] ALPRAZolam (XANAX) 1 MG tablet     . amLODipine (NORVASC) 5 MG tablet     . budesonide-formoterol (SYMBICORT) 160-4.5 MCG/ACT inhaler Inhale 2 puffs into the lungs daily.    [name] escitalopram (LEXAPRO) 20 MG tablet     . gabapentin (NEURONTIN) 300 MG capsule Take 300 mg by mouth 3 (three) times daily.    . methadone (DOLOPHINE) 10 MG tablet 80 mg.    . tiZANidine (ZANAFLEX) 4 MG tablet     . varenicline (CHANTIX) 1 MG tablet Take 1 mg by mouth 2 (two) times daily.     No current facility-administered medications for this visit.     Allergies  Allergen  Reactions  . Tetracyclines & Related     Review of Systems  Constitutional: Positive for activity change, fatigue and unexpected weight change (has gained 10 pounds in 3 months). Negative for appetite change, chills and fever.  HENT: Positive for voice change. Negative for trouble swallowing.   Eyes: Negative for visual disturbance.  Respiratory: Positive for cough (Small amount of hemoptysis), chest tightness, shortness of breath and wheezing.   Cardiovascular: Negative for chest pain.  Gastrointestinal: Negative for blood in stool.       Heartburn  Endocrine: Negative for polydipsia, polyphagia and polyuria.  Genitourinary: Positive for frequency. Negative for dysuria.  Musculoskeletal: Positive for arthralgias, back pain and gait problem.       Chronic pain on methadone  Neurological: Negative for syncope and weakness.  Hematological: Negative for adenopathy. Does not bruise/bleed easily.  All other systems reviewed and are negative.   BP (!) 150/80 (BP Location: Right Arm, Patient Position: Sitting, Cuff Size: Large)   Pulse 96   Resp 20   Ht 6' (1.829 m)   Wt 269 lb (122 kg)   SpO2 98% Comment: RA  BMI 36.48 kg/m  Physical Exam  Constitutional: He is oriented to person, place, and time.  Obese  HENT:  Head: Normocephalic and atraumatic.  Eyes: EOM are normal. Pupils are equal, round, and reactive to light. No scleral icterus.  Neck: Neck supple. No thyromegaly present.  Cardiovascular: Normal rate, regular rhythm, normal heart sounds and intact distal pulses.   No murmur heard. Pulmonary/Chest: Effort normal. He has no wheezes. He has no rales.  Egophony right base  Abdominal: Soft. He exhibits no distension. There is no tenderness.  Musculoskeletal: He exhibits no edema.  Lymphadenopathy:    He has no cervical adenopathy.  Neurological: He is alert and oriented to person, place, and time. No cranial nerve deficit. He exhibits normal muscle tone.  Skin: Skin is warm  and dry.  Vitals reviewed.    Diagnostic Tests: NUCLEAR MEDICINE PET SKULL BASE TO THIGH TECHNIQUE: 13.0 mCi F-18 FDG was injected intravenously. Full-ring PET imaging was performed from the skull base to thigh after the radiotracer. CT data was obtained and used for attenuation correction and anatomic localization. FASTING BLOOD GLUCOSE:  Value: 128 mg/dl COMPARISON:  CT abdomen dated 06/17/2016. FINDINGS: NECK No hypermetabolic lymph nodes in the neck. CHEST 6.3 x 3.7 cm right lower lobe mass, max SUV 4.1, suspicious for primary bronchogenic neoplasm. Lesion demonstrates 8 peripheral  ground-glass opacity with central solid component measuring 3.7 cm. Associated extension to the pleural surface. Thoracic lymphadenopathy, including: --1.7 cm short axis prevascular node (series 2/ image 69), max SUV 10.0 --1.7 cm short axis right paratracheal node (series 4/ image 73), max SUV 9.8 --1.9 cm short axis AP window node (series 4/image 34), max SUV 7.3 --2.2 cm short axis subcarinal node (series 4/ image 81), max SUV 7.6 Mild right perihilar hypermetabolism, max SUV 6.0. Associated right middle lobe atelectasis/collapse. ABDOMEN/PELVIS No abnormal hypermetabolic activity within the liver, pancreas, adrenal glands, or spleen. Hepatic steatosis. Small upper abdominal lymph nodes measuring up to 1.8 cm short axis (series 4/image 131), without hypermetabolism. No hypermetabolic lymph nodes in the abdomen or pelvis. SKELETON Postsurgical changes involving the lumbar spine. No focal hypermetabolic activity to suggest skeletal metastasis. IMPRESSION: 6.3 x 3.7 cm right lower lobe mass, suspicious for primary bronchogenic neoplasm, as described above. Hypermetabolic thoracic nodal metastases, as above. Additional right perihilar hypermetabolism, indeterminate. Associated right middle lobe atelectasis/collapse. Electronically Signed   By: Julian Hy M.D.   On: 07/01/2016  12:18 I personally reviewed the PET/CT Ker[place] sizes noted above. Findings consistent with stage IIIB lung cancer  Impression: [name] is a 55 year old man with a history of tobacco abuse who has a right hilar mass, right lower lobe mass, and extensive mediastinal adenopathy. This is almost certainly a new primary bronchogenic carcinoma. The primary differential to be made is to determine whether it is small cell or non-small cell. Secondarily to determine the subtype non-small cell. Regardless of which type it is it is not surgically resectable.  I recommended to him that he undergo bronchoscopy and endobronchial ultrasound for diagnostic purposes. We would do this with him in the operating room so that we could ensure ability to get adequate samples. We will do our best to obtain adequate material for molecular testing should this turn out to be non-small cell. I described the procedure to him. He understands the need for general anesthesia and the likelihood of success (85%) and making a diagnosis. I reviewed the indications, risks, benefits, and alternatives. He understands the risks include those associated with general anesthesia and include, but are not limited to death, MI, DVT, PE, bleeding, pneumothorax, and failure to make a diagnosis.  He understands and accepts the risks and agrees to proceed.  Plan: Bronchoscopy and endobronchial ultrasound on Monday, 07/19/2016.  [name], MD [facility] [phone]

## 2016-07-19 NOTE — Anesthesia Preprocedure Evaluation (Addendum)
Anesthesia Evaluation  Patient identified by MRN, date of birth, ID band Patient awake    Reviewed: Allergy & Precautions, NPO status , Patient's Chart, lab work & pertinent test results  History of Anesthesia Complications Negative for: history of anesthetic complications  Airway Mallampati: III  TM Distance: >3 FB Neck ROM: Full    Dental  (+) Partial Upper, Dental Advisory Given   Pulmonary neg shortness of breath, asthma (used albuterol this morning) , neg sleep apnea, neg recent URI, Current Smoker,  Chronic cough   Pulmonary exam normal breath sounds clear to auscultation       Cardiovascular (-) angina(-) Past MI, (-) Cardiac Stents, (-) CABG, (-) Orthopnea and (-) PND + dysrhythmias (PVCs)  Rhythm:Regular Rate:Normal     Neuro/Psych neg Seizures Anxiety Depression negative neurological ROS     GI/Hepatic negative GI ROS, Neg liver ROS, GERD  Controlled,  Endo/Other  negative endocrine ROS  Renal/GU negative Renal ROS     Musculoskeletal  (+) Arthritis ,   Abdominal (+) + obese,   Peds  Hematology negative hematology ROS (+)   Anesthesia Other Findings   Reproductive/Obstetrics                           Lab Results  Component Value Date   WBC 7.8 07/19/2016   HGB 15.4 07/19/2016   HCT 46.7 07/19/2016   MCV 101.1 (H) 07/19/2016   PLT 172 07/19/2016   Lab Results  Component Value Date   CREATININE 0.71 07/19/2016   BUN 11 07/19/2016   NA 139 07/19/2016   K 4.0 07/19/2016   CL 104 07/19/2016   CO2 26 07/19/2016    Anesthesia Physical Anesthesia Plan  ASA: II  Anesthesia Plan: General   Post-op Pain Management:    Induction: Intravenous  Airway Management Planned: Oral ETT  Additional Equipment:   Intra-op Plan:   Post-operative Plan: Extubation in OR  Informed Consent: I have reviewed the patients History and Physical, chart, labs and discussed the procedure  including the risks, benefits and alternatives for the proposed anesthesia with the patient or authorized representative who has indicated his/her understanding and acceptance.   Dental advisory given  Plan Discussed with: CRNA  Anesthesia Plan Comments:         Anesthesia Quick Evaluation

## 2016-07-19 NOTE — Interval H&P Note (Signed)
History and Physical Interval Note:  07/19/2016 11:21 AM  Erik [name]  has presented today for surgery, with the diagnosis of RIGHT HILAR MASS MEDIASTINAL ADENOPATHY  The various methods of treatment have been discussed with the patient and family. After consideration of risks, benefits and other options for treatment, the patient has consented to  Procedure(s): [place] (N/A) as a surgical intervention .  The patient's history has been reviewed, patient examined, no change in status, stable for surgery.  I have reviewed the patient's chart and labs.  Questions were answered to the patient's satisfaction.     [name]

## 2016-07-19 NOTE — Brief Op Note (Signed)
07/19/2016  1:43 PM  PATIENT:  Lazarus Gowda  55 y.o. male  PRE-OPERATIVE DIAGNOSIS:  RIGHT HILAR MASS MEDIASTINAL ADENOPATHY  POST-OPERATIVE DIAGNOSIS:  Non-Small Cell Carcinoma, Clinical stage IIIB  PROCEDURE:  Procedure(s): VIDEO BRONCHOSCOPY WITH ENDOBRONCHIAL ULTRASOUND (N/A) Bronchoscopy with brushings and biopsies EBUS with mediastinal lymph node aspirations  SURGEON:  Surgeon(s) and Role:    * [name], MD - Primary  ANESTHESIA:   general  EBL:  Total I/O In: 1000 [I.V.:1000] Out: 5 [Blood:5]  LOCAL MEDICATIONS USED:  NONE  SPECIMEN:  Source of Specimen:  4R, 7 lymph nodes. right middle lobe  DISPOSITION OF SPECIMEN:  PATHOLOGY  PLAN OF CARE: Discharge to home after PACU  PATIENT DISPOSITION:  PACU - hemodynamically stable.   Delay start of Pharmacological VTE agent (>24hrs) due to surgical blood loss or risk of bleeding: not applicable

## 2016-07-19 NOTE — Op Note (Signed)
NAME:  YUVIN, BUSSIERE NO.:  [id number]  MEDICAL RECORD NO.:  [id number]  LOCATION:  [facility]                         FACILITY:  [facility]  PHYSICIAN:  Revonda Standard. Roxan Hockey, M.D.DATE OF BIRTH:  1961/08/03  DATE OF PROCEDURE:  07/19/2016 DATE OF DISCHARGE:  07/19/2016                              OPERATIVE REPORT   PREOPERATIVE DIAGNOSIS:  Right lower lobe mass with hilar and mediastinal adenopathy.  POSTOPERATIVE DIAGNOSIS:  Non-small cell carcinoma, clinical stage IIIB.  PROCEDURE:    Bronchoscopy with brushings and biopsies  Endobronchial ultrasound with mediastinal lymph node aspirations.  SURGEON:  Revonda Standard. Roxan Hockey, M.D.  ASSISTANT:  None.  ANESTHESIA:  General.  FINDINGS:  Multiple enlarged mediastinal lymph nodes. Aspirations of 4R node positive for non-small cell carcinoma.  Extensive edema in the right-sided airways. Four segmental airways in the right upper lobe. Right middle lobe partially occluded by extrinsic compression and edema.  CLINICAL NOTE:  Mr. Haselton is a 55 year old male with a history of tobacco abuse who recently had an annual medical exam.  Laboratory testing showed elevated transaminases.  A CT of the abdomen was performed that showed a right lower lobe mass and some right middle lobe atelectasis. A PET-CT showed right hilar mass, right lower lobe mass and extensive bilateral mediastinal adenopathy.  These areas were hypermetabolic consistent with malignancy.  The patient was advised to undergo bronchoscopy and endobronchial ultrasound for diagnostic purposes.  The indications, risks, benefits and alternatives were discussed in detail with the patient. He understood and accepted the risks and agreed to proceed.  OPERATIVE NOTE:  Mr. Kirschenmann was brought to the operating room on July 19, 2016. He had induction of general anesthesia and was intubated. Flexible fiberoptic bronchoscopy was performed via the endotracheal tube.  There  were thick secretions bilaterally, these were white and purulent, they cleared with saline.  There were no endobronchial lesions to the level of the subsegmental bronchi on the left side.  There was extensive edema around the carina and all throughout the right lung airways.  The right middle lobe bronchus was narrowed to a slip due to edema in the airway and extrinsic compression. The scope did pass into the right middle lobe bronchus and no definite mass was seen.  There were no endobronchial lesions in the lower lobe or upper lobe bronchi.  The endobronchial ultrasound probe was advanced. A large level 7 node was identified.  Multiple aspirations were obtained from this node.  With each aspiration, the needle was advanced into the lymph node with ultrasound visualization and 10-12 passes were made with the needle with suction applied.  The specimens were applied to slides.  Additional aspirations were performed, but these specimens being placed into the cytologic preparation fluid.  Next, an enlarged 4R node was identified and multiple aspirations were taken from that area as well.  These were sent for pathology.  The endobronchial ultrasound probe was removed. The bronchoscope was reinserted.  Brushings were performed from the right middle lobe airway and then multiple biopsies were obtained.  The node aspirations returned showing atypical cells on the level 7 node and non-small cell carcinoma on the level 4R node.  Additional  biopsies were obtained.  There was bleeding with the brushings and biopsies and dilute epinephrine was applied to help decrease the bleeding.  All of the biopsies were sent for permanent pathology.  Final inspection was made with the bronchoscope, there was no ongoing bleeding.  The bronchoscope was removed.  The patient then was extubated in the operating room and taken to the postanesthetic care unit in good condition.     Revonda Standard Roxan Hockey,  M.D.     SCH/MEDQ  D:  07/19/2016  T:  07/19/2016  Job:  [id number]

## 2016-07-19 NOTE — Anesthesia Procedure Notes (Signed)
Procedure Name: Intubation Date/Time: 07/19/2016 12:05 PM Performed by: Salli Quarry Fay Bagg Pre-anesthesia Checklist: Patient identified, Emergency Drugs available, Suction available and Patient being monitored Patient Re-evaluated:Patient Re-evaluated prior to inductionOxygen Delivery Method: Circle System Utilized Preoxygenation: Pre-oxygenation with 100% oxygen Intubation Type: IV induction Ventilation: Mask ventilation with difficulty Laryngoscope Size: Mac and 4 Grade View: Grade II Tube type: Oral Tube size: 8.5 mm Number of attempts: 1 Airway Equipment and Method: Stylet Placement Confirmation: ETT inserted through vocal cords under direct vision,  positive ETCO2 and breath sounds checked- equal and bilateral Secured at: 25 cm Tube secured with: Tape Dental Injury: Teeth and Oropharynx as per pre-operative assessment

## 2016-07-19 NOTE — Transfer of Care (Signed)
Immediate Anesthesia Transfer of Care Note  Patient: Erik [name]  Procedure(s) Performed: Procedure(s): VIDEO BRONCHOSCOPY WITH ENDOBRONCHIAL ULTRASOUND (N/A)  Patient Location: PACU  Anesthesia Type:General  Level of Consciousness: Patient remains intubated per anesthesia plan  Airway & Oxygen Therapy: Patient remains intubated per anesthesia plan and Patient placed on Ventilator (see vital sign flow sheet for setting)  Post-op Assessment: Report given to RN and Post -op Vital signs reviewed and stable  Post vital signs: Reviewed and stable  Last Vitals:  Vitals:   07/19/16 0938 07/19/16 1049  BP: (!) 191/100 (!) 160/90  Pulse: 88   Resp: 20   Temp: 37.3 C     Last Pain:  Vitals:   07/19/16 1040  TempSrc:   PainSc: 0-No pain         Complications: No apparent anesthesia complications

## 2016-07-20 ENCOUNTER — Encounter (HOSPITAL_COMMUNITY): Payer: Self-pay | Admitting: Thoracic Surgery (Cardiothoracic Vascular Surgery)

## 2016-07-21 LAB — POCT I-STAT 7, (LYTES, BLD GAS, ICA,H+H)
ACID-BASE DEFICIT: 4 mmol/L — AB (ref 0.0–2.0)
BICARBONATE: 27.9 meq/L — AB (ref 20.0–24.0)
CALCIUM ION: 1.24 mmol/L (ref 1.13–1.30)
HCT: 48 % (ref 39.0–52.0)
HEMOGLOBIN: 16.3 g/dL (ref 13.0–17.0)
O2 Saturation: 96 %
PO2 ART: 109 mmHg — AB (ref 80.0–100.0)
Potassium: 4.4 mmol/L (ref 3.5–5.1)
SODIUM: 140 mmol/L (ref 135–145)
TCO2: 30 mmol/L (ref 0–100)
pCO2 arterial: 81 mmHg (ref 35.0–45.0)
pH, Arterial: 7.145 — CL (ref 7.350–7.450)

## 2016-07-22 ENCOUNTER — Ambulatory Visit (HOSPITAL_COMMUNITY)
Admission: RE | Admit: 2016-07-22 | Discharge: 2016-07-22 | Disposition: A | Discharge status: Home / Self Care | Payer: Medicare Other | Source: Ambulatory Visit | Attending: Hematology | Admitting: Hematology

## 2016-07-22 DIAGNOSIS — R9082 White matter disease, unspecified: Secondary | ICD-10-CM | POA: Insufficient documentation

## 2016-07-22 DIAGNOSIS — J984 Other disorders of lung: Secondary | ICD-10-CM | POA: Insufficient documentation

## 2016-07-22 DIAGNOSIS — R918 Other nonspecific abnormal finding of lung field: Secondary | ICD-10-CM

## 2016-07-22 MED ORDER — GADOBENATE DIMEGLUMINE 529 MG/ML IV SOLN
20.0000 mL | Freq: Once | INTRAVENOUS | Status: AC | PRN
  Administered 2016-07-22: 20 mL via INTRAVENOUS

## 2016-07-23 ENCOUNTER — Encounter (HOSPITAL_COMMUNITY): Payer: Self-pay | Admitting: Oncology

## 2016-07-23 ENCOUNTER — Encounter (HOSPITAL_BASED_OUTPATIENT_CLINIC_OR_DEPARTMENT_OTHER): Payer: Medicare Other | Admitting: Oncology

## 2016-07-23 ENCOUNTER — Encounter (HOSPITAL_COMMUNITY): Payer: Self-pay | Admitting: Lab

## 2016-07-23 ENCOUNTER — Encounter (HOSPITAL_COMMUNITY): Payer: Medicare Other

## 2016-07-23 VITALS — BP 154/90 | HR 90 | Temp 98.1°F | Resp 18 | Wt 262.9 lb

## 2016-07-23 DIAGNOSIS — C771 Secondary and unspecified malignant neoplasm of intrathoracic lymph nodes: Secondary | ICD-10-CM | POA: Diagnosis not present

## 2016-07-23 DIAGNOSIS — D751 Secondary polycythemia: Secondary | ICD-10-CM | POA: Diagnosis not present

## 2016-07-23 DIAGNOSIS — C3491 Malignant neoplasm of unspecified part of right bronchus or lung: Secondary | ICD-10-CM

## 2016-07-23 DIAGNOSIS — C3431 Malignant neoplasm of lower lobe, right bronchus or lung: Secondary | ICD-10-CM | POA: Diagnosis present

## 2016-07-23 HISTORY — DX: Malignant neoplasm of unspecified part of right bronchus or lung: C34.91

## 2016-07-23 NOTE — Assessment & Plan Note (Addendum)
Stage IIIA (T3N2M0) adenocarcinoma of right lung.  S/P biopsy by Dr. Roxan Hockey on 07/19/2016 proving Stage III disease.  Oncology history is developed.  I personally reviewed and went over radiographic studies with the patient.  The results are noted within this dictation.  MRI of brain is negative for any intracranial metastatic disease.  I have also reviewed his staging PET scan with him.  I personally reviewed and went over pathology results with the patient.  Pathology demonstrates adenocarcinoma of primary lesion brushings and of lymph nodes biopsied by Dr. Roxan Hockey.  Patient is provided education regarding NSCLC.  He is educated on his stage of disease which is Stage IIIA.  We reviewed the NCCN guidelines pertaining to treatment in this scenario.  He is educated on the recommendations regarding concomitant chemoXRT with curative intent.  We reviewed the recommended systemic chemotherapy options.  I reviewed systemic chemotherapy options.  I have recommended Cisplatin days 1, 8, 29, 33 and Etoposide days 1-5 and 29-33.  I reviewed the risks, benefits, altenratives (including other treatment options), and side effects of therapy, including, but not limited to, alopecia, fatigue, nausea, vomiting, diarrhea, constipation, renal failure, anaphylaxis, death.  I reviewed the role of a port a cath and we looked at pictures of the device.  He is interested in staying local at this time and therefore, he refuses referral back to [facility] (Dr. Governor Rooks) for port placement.  He is provided local options including [place] and [place], [place].  He would like to proceed with referral to [facility] in [place] for port placement.  I called Dr. Rosana Hoes who agrees to see the patient next Tuesday with plans to place port later next week.  We will refer the patient to XRT for consultation and planning for concomitant chemoXRT.  I have messaged radiation oncologist as well.  Return in ~ 2 weeks for chemotherapy  teaching and to embark on systemic chemotherapy.  He is provided reading information regarding NSCLC.  He will return in near future for follow-up.

## 2016-07-23 NOTE — Progress Notes (Signed)
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  INTERVAL HISTORY: [name] 55 y.o. 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    Adenocarcinoma of right lung ([name])   07/01/2016 PET scan    6.3 x 3.7 cm right lower lobe mass, suspicious for primary bronchogenic neoplasm, as described above. Hypermetabolic thoracic nodal metastases, as above. Additional right perihilar hypermetabolism, indeterminate. Associated right middle lobe atelectasis/collapse.      07/19/2016 Procedure    Bronchoscopy with brushings and biopsies and endobronchial ultrasound with mediastinal lymph node aspirations by Dr. Roxan Hockey      07/21/2016 Pathology Results    Lung, biopsy, Right Middle Lobe - LUNG TISSUE WITH SQUAMOUS METAPLASIA. - NO MALIGNANCY IDENTIFIED.      07/21/2016 Pathology Results    FINE NEEDLE ASPIRATION, ENDOSCOPIC (A) LEVEL 7 (SPECIMEN 1 OF 3, COLLECTED ON 07/19/16): MALIGNANT CELLS CONSISTENT WITH ADENOCARCINOMA.      07/21/2016 Pathology Results    FINE NEEDLE ASPIRATION, EBUS, 4R, B (SPECIMEN 2 OF 3, COLLECTED 07/19/16): MALIGNANT CELLS CONSISTENT WITH ADENOCARCINOMA.      07/21/2016 Pathology Results    FINE NEEDLE ASPIRATION, EBUS, BRUSHING, RIGHT MIDDLE LOBE, D (SPECIMEN 3 OF 3, COLLECTED 07/19/16): MALIGNANT CELLS CONSISTENT WITH ADENOCARCINOMA.      07/22/2016 Imaging    MRI brain- No evidence of intracranial metastases.      Today he has no complaints.  He is interested to learn of his pathology results, staging, and plan moving forward.  He does have a chronic cough.  He is a febrile and it is nonproductive.  Review of Systems  Constitutional: Negative.  Negative for chills, fever and weight loss.  HENT: Negative.   Eyes: Negative for blurred vision and double  vision.  Respiratory: Positive for cough and wheezing. Negative for hemoptysis and sputum production.   Cardiovascular: Negative.  Negative for chest pain.  Gastrointestinal: Negative.  Negative for nausea and vomiting.  Genitourinary: Negative.  Negative for dysuria.  Musculoskeletal: Negative.   Skin: Negative.   Neurological: Negative.  Negative for weakness and headaches.  Endo/Heme/Allergies: Negative.   Psychiatric/Behavioral: Negative.     Past Medical History:  Diagnosis Date  . Arnold-Chiari syndrome ([place])   . Arthritis   . Chronic back pain   . Depression   . GERD (gastroesophageal reflux disease)   . Seasonal allergies   . Spinal stenosis of lumbar region   . 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 Wheezing     Past Surgical History:  Procedure Laterality Date  . BACK SURGERY    . MULTIPLE EXTRACTIONS WITH ALVEOLOPLASTY N/A 07/08/2014   Procedure: MULTIPLE EXTRACION WITH ALVEOLOPLASTY with EXCISION LESION RIGHT SIDE OF TONGUE;  Surgeon: Gae Bon, DDS;  Location: [place];  Service: Oral Surgery;  Laterality: N/A;  . VIDEO BRONCHOSCOPY WITH ENDOBRONCHIAL ULTRASOUND N/A 07/19/2016   Procedure: VIDEO BRONCHOSCOPY WITH ENDOBRONCHIAL ULTRASOUND;  Surgeon: [name], MD;  Location: [facility] OR;  Service: Thoracic;  Laterality: N/A;    Family History  Problem Relation Age of Onset  . Cancer Mother     Social History   Social History  . Marital status: Divorced    Spouse name: N/A  . Number of children: N/A  . Years of education: N/A   Social History Main  Topics  . Smoking status: Current Every Day Smoker    Packs/day: 2.00    Years: 38.00    Types: Cigarettes  . Smokeless tobacco: Former Systems developer    Types: Snuff     Comment: using chantix- smoking once in a while  . Alcohol use 1.8 oz/week    3 Cans of beer per week     Comment: reports drinking 2-3 beers a week  . Drug use:     Types: Hydrocodone  . Sexual activity: Not Currently    Other Topics Concern  . Not on file   Social History Narrative  . No narrative on file     PHYSICAL EXAMINATION  ECOG PERFORMANCE STATUS: 1 - Symptomatic but completely ambulatory  Vitals:   07/23/16 0909  BP: (!) 154/90  Pulse: 90  Resp: 18  Temp: 98.1 F (36.7 C)    GENERAL:alert, no distress, well nourished, well developed, comfortable, cooperative, obese, smiling and chronically ill appearing, unaccompanied SKIN: skin color, texture, turgor are normal, no rashes or significant lesions HEAD: Normocephalic, No masses, lesions, tenderness or abnormalities EYES: normal, EOMI, Conjunctiva are pink and non-injected EARS: External ears normal OROPHARYNX:lips, buccal mucosa, and tongue normal and mucous membranes are moist  NECK: supple, no adenopathy, thyroid normal size, non-tender, without nodularity, trachea midline LYMPH:  no palpable lymphadenopathy, no hepatosplenomegaly BREAST:not examined LUNGS: positive findings: rhonchi  B/L, wheezing  Expiratory B/L HEART: regular rate & rhythm, no murmurs and no gallops ABDOMEN:abdomen soft, non-tender, obese, normal bowel sounds and no masses or organomegaly BACK: Back symmetric, no curvature. EXTREMITIES:less then 2 second capillary refill, no joint deformities, effusion, or inflammation, no skin discoloration, no cyanosis  NEURO: alert & oriented x 3 with fluent speech, no focal motor/sensory deficits, gait normal   LABORATORY DATA: CBC    Component Value Date/Time   WBC 7.8 07/19/2016 1006   RBC 4.62 07/19/2016 1006   HGB 16.3 07/19/2016 1407   HCT 48.0 07/19/2016 1407   PLT 172 07/19/2016 1006   MCV 101.1 (H) 07/19/2016 1006   MCH 33.3 07/19/2016 1006   MCHC 33.0 07/19/2016 1006   RDW 13.7 07/19/2016 1006   LYMPHSABS 2.8 07/09/2016 1700   MONOABS 0.8 07/09/2016 1700   EOSABS 0.1 07/09/2016 1700   BASOSABS 0.0 07/09/2016 1700      Chemistry      Component Value Date/Time   NA 140 07/19/2016 1407   K 4.4  07/19/2016 1407   CL 104 07/19/2016 1006   CO2 26 07/19/2016 1006   BUN 11 07/19/2016 1006   CREATININE 0.71 07/19/2016 1006      Component Value Date/Time   CALCIUM 9.4 07/19/2016 1006   ALKPHOS 100 07/19/2016 1006   AST 43 (H) 07/19/2016 1006   ALT 45 07/19/2016 1006   BILITOT 0.7 07/19/2016 1006        PENDING LABS:   RADIOGRAPHIC STUDIES:  Dg Chest 2 View  Result Date: 07/19/2016 CLINICAL DATA:  Preop evaluation for upcoming right lung surgery EXAM: CHEST  2 VIEW COMPARISON:  07/01/2016 FINDINGS: Cardiac shadow is within normal limits. The lungs are well aerated with the exception of persistent right middle lobe atelectasis stable from the prior exam. The known right lower lobe mass is less well appreciated on the current exam. No acute bony abnormality is noted. IMPRESSION: Persistent right middle lobe collapse. The known right lower lobe mass is less well visualized on the current study. Electronically Signed   By: Linus Mako.D.  On: 07/19/2016 10:44   Mr Jeri Cos WU Contrast  Result Date: 07/22/2016 CLINICAL DATA:  Recently diagnosed right lower lobe lung cancer. Initial staging. EXAM: MRI HEAD WITHOUT AND WITH CONTRAST TECHNIQUE: Multiplanar, multiecho pulse sequences of the brain and surrounding structures were obtained without and with intravenous contrast. CONTRAST:  29m MULTIHANCE GADOBENATE DIMEGLUMINE 529 MG/ML IV SOLN COMPARISON:  None. FINDINGS: There is no evidence of acute infarct, intracranial hemorrhage, mass, midline shift, or extra-axial fluid collection. The ventricles and sulci are normal. The cerebellar tonsils minimally extend below the foramen magnum, within normal limits. Small foci of T2 hyperintensity in the cerebral white matter are mildly advanced for age. A small developmental venous anomaly is noted in the right cerebellum. No abnormal parenchymal or meningeal enhancement suggestive of metastatic disease is seen. Motion artifact on postcontrast  imaging could potentially obscure very small lesions. Orbits are unremarkable. No significant inflammatory disease is seen in the paranasal sinuses are mastoid air cells. The major intracranial vascular flow voids are preserved with the left vertebral artery being dominant. No focal osseous lesion is identified. IMPRESSION: 1. No evidence of intracranial metastases. 2. Mild cerebral white matter disease, nonspecific but may reflect chronic small vessel ischemia. Electronically Signed   By: ALogan BoresM.D.   On: 07/22/2016 13:56   Nm Pet Image Initial (pi) Skull Base To Thigh  Result Date: 07/01/2016 CLINICAL DATA:  Initial treatment strategy for right lower lung mass. EXAM: NUCLEAR MEDICINE PET SKULL BASE TO THIGH TECHNIQUE: 13.0 mCi F-18 FDG was injected intravenously. Full-ring PET imaging was performed from the skull base to thigh after the radiotracer. CT data was obtained and used for attenuation correction and anatomic localization. FASTING BLOOD GLUCOSE:  Value: 128 mg/dl COMPARISON:  CT abdomen dated 06/17/2016. FINDINGS: NECK No hypermetabolic lymph nodes in the neck. CHEST 6.3 x 3.7 cm right lower lobe mass, max SUV 4.1, suspicious for primary bronchogenic neoplasm. Lesion demonstrates 8 peripheral ground-glass opacity with central solid component measuring 3.7 cm. Associated extension to the pleural surface. Thoracic lymphadenopathy, including: --1.7 cm short axis prevascular node (series 2/ image 69), max SUV 10.0 --1.7 cm short axis right paratracheal node (series 4/ image 73), max SUV 9.8 --1.9 cm short axis AP window node (series 4/image 34), max SUV 7.3 --2.2 cm short axis subcarinal node (series 4/ image 81), max SUV 7.6 Mild right perihilar hypermetabolism, max SUV 6.0. Associated right middle lobe atelectasis/collapse. ABDOMEN/PELVIS No abnormal hypermetabolic activity within the liver, pancreas, adrenal glands, or spleen. Hepatic steatosis. Small upper abdominal lymph nodes measuring up to  1.8 cm short axis (series 4/image 131), without hypermetabolism. No hypermetabolic lymph nodes in the abdomen or pelvis. SKELETON Postsurgical changes involving the lumbar spine. No focal hypermetabolic activity to suggest skeletal metastasis. IMPRESSION: 6.3 x 3.7 cm right lower lobe mass, suspicious for primary bronchogenic neoplasm, as described above. Hypermetabolic thoracic nodal metastases, as above. Additional right perihilar hypermetabolism, indeterminate. Associated right middle lobe atelectasis/collapse. Electronically Signed   By: SJulian HyM.D.   On: 07/01/2016 12:18    PATHOLOGY: As above in oncology history.    ASSESSMENT AND PLAN:  Adenocarcinoma of right lung (HCC) Stage IIIA (T3N2M0) adenocarcinoma of right lung.  S/P biopsy by Dr. HRoxan Hockeyon 07/19/2016 proving Stage III disease.  Oncology history is developed.  I personally reviewed and went over radiographic studies with the patient.  The results are noted within this dictation.  MRI of brain is negative for any intracranial metastatic disease.  I have also  reviewed his staging PET scan with him.  I personally reviewed and went over pathology results with the patient.  Pathology demonstrates adenocarcinoma of primary lesion brushings and of lymph nodes biopsied by Dr. Roxan Hockey.  Patient is provided education regarding NSCLC.  He is educated on his stage of disease which is Stage IIIA.  We reviewed the NCCN guidelines pertaining to treatment in this scenario.  He is educated on the recommendations regarding concomitant chemoXRT with curative intent.  We reviewed the recommended systemic chemotherapy options.  I reviewed systemic chemotherapy options.  I have recommended Cisplatin days 1, 8, 29, 33 and Etoposide days 1-5 and 29-33.  I reviewed the risks, benefits, altenratives (including other treatment options), and side effects of therapy, including, but not limited to, alopecia, fatigue, nausea, vomiting,  diarrhea, constipation, renal failure, anaphylaxis, death.  I reviewed the role of a port a cath and we looked at pictures of the device.  He is interested in staying local at this time and therefore, he refuses referral back to [facility] (Dr. Governor Rooks) for port placement.  He is provided local options including [place] and [place], [name].  He would like to proceed with referral to Gen Surg in [place] for port placement.  I called Dr. Rosana Hoes who agrees to see the patient next Tuesday with plans to place port later next week.  We will refer the patient to XRT for consultation and planning for concomitant chemoXRT.  I have messaged radiation oncologist as well.  Return in ~ 2 weeks for chemotherapy teaching and to embark on systemic chemotherapy.  He is provided reading information regarding NSCLC.  He will return in near future for follow-up.     ORDERS PLACED FOR THIS ENCOUNTER: Orders Placed This Encounter  Procedures  . JAK2 V617F, Rfx CALR/E12/MPL    MEDICATIONS PRESCRIBED THIS ENCOUNTER: No orders of the defined types were placed in this encounter.   THERAPY PLAN:  We will get a port placed, XRT consultation, and plan for concomitant chemoXRT consisting of Cisplatin Day 1, 8, 29, 33 and Etoposide days 1-5 and 29-33 with curative intent.  All questions were answered. The patient knows to call the clinic with any problems, questions or concerns. We can certainly see the patient much sooner if necessary.  Patient and plan discussed with Dr. Ancil Linsey and she is in agreement with the aforementioned.   This note is electronically signed by: Doy Mince 07/23/2016 5:13 PM

## 2016-07-23 NOTE — Patient Instructions (Signed)
[place] Discharge Instructions  RECOMMENDATIONS MADE BY THE CONSULTANT AND ANY TEST RESULTS WILL BE SENT TO YOUR REFERRING PHYSICIAN.  You saw [name], PA-C, today. You will be set up for radiation in [place]. You will be set up for port a cath placement You will be set up for chemotherapy teaching.  Thank you for choosing [place] at [facility] to provide your oncology and hematology care.  To afford each patient quality time with our provider, please arrive at least 15 minutes before your scheduled appointment time.   Beginning January 23rd 2017 lab work for the [facility] will be done in the  Main lab at [facility] on 1st floor. If you have a lab appointment with the [facility] please come in thru the  Main Entrance and check in at the main information desk  You need to re-schedule your appointment should you arrive 10 or more minutes late.  We strive to give you quality time with our providers, and arriving late affects you and other patients whose appointments are after yours.  Also, if you no show three or more times for appointments you may be dismissed from the clinic at the providers discretion.     Again, thank you for choosing [facility].  Our hope is that these requests will decrease the amount of time that you wait before being seen by our physicians.       _____________________________________________________________  Should you have questions after your visit to [facility], please contact our office at [phone] between the hours of 8:30 a.m. and 4:30 p.m.  Voicemails left after 4:30 p.m. will not be returned until the following business day.  For prescription refill requests, have your pharmacy contact our office.         Resources For Cancer Patients and their Caregivers ? American Cancer Society: Can assist with transportation, wigs, general needs, runs Look Good  Feel Better.        (332)405-0560 ? Cancer Care: Provides financial assistance, online support groups, medication/co-pay assistance.  1-800-813-HOPE (615)606-6443) ? [place] Assists [place] cancer patients and their families through emotional , educational and financial support.  9563419338 ? [place] DSS Where to apply for food stamps, Medicaid and utility assistance. 424-381-9729 ? [facility]: Transportation to medical appointments. 405-098-2206 ? Social Security Administration: May apply for disability if have a Stage IV cancer. 7873146026 513-096-6172 ? [employer], Disability and Transit Services: Assists with nutrition, care and transit needs. [place] Support Programs: '@10RELATIVEDAYS'$ @ > Cancer Support Group  2nd Tuesday of the month 1pm-2pm, Journey Room  > Creative Journey  3rd Tuesday of the month 1130am-1pm, Journey Room  > Look Good Feel Better  1st Wednesday of the month 10am-12 noon, Journey Room (Call American Cancer Society to register (838)635-4756)    Implanted [facility] Guide An implanted port is a type of central line that is placed under the skin. Central lines are used to provide IV access when treatment or nutrition needs to be given through a person's veins. Implanted ports are used for long-term IV access. An implanted port may be placed because:   You need IV medicine that would be irritating to the small veins in your hands or arms.   You need long-term IV medicines, such as antibiotics.   You need IV nutrition for a long period.   You need frequent  blood draws for lab tests.   You need dialysis.  Implanted ports are usually placed in the chest area, but they can also be placed in the upper arm, the abdomen, or the leg. An implanted port has two main parts:   Reservoir. The reservoir is round and will appear as a small, raised area under your skin. The reservoir is the part where a  needle is inserted to give medicines or draw blood.   Catheter. The catheter is a thin, flexible tube that extends from the reservoir. The catheter is placed into a large vein. Medicine that is inserted into the reservoir goes into the catheter and then into the vein.  HOW WILL I CARE FOR MY INCISION SITE? Do not get the incision site wet. Bathe or shower as directed by your health care provider.  HOW IS MY PORT ACCESSED? Special steps must be taken to access the port:   Before the port is accessed, a numbing cream can be placed on the skin. This helps numb the skin over the port site.   Your health care provider uses a sterile technique to access the port.  Your health care provider must put on a mask and sterile gloves.  The skin over your port is cleaned carefully with an antiseptic and allowed to dry.  The port is gently pinched between sterile gloves, and a needle is inserted into the port.  Only "non-coring" port needles should be used to access the port. Once the port is accessed, a blood return should be checked. This helps ensure that the port is in the vein and is not clogged.   If your port needs to remain accessed for a constant infusion, a clear (transparent) bandage will be placed over the needle site. The bandage and needle will need to be changed every week, or as directed by your health care provider.   Keep the bandage covering the needle clean and dry. Do not get it wet. Follow your health care provider's instructions on how to take a shower or bath while the port is accessed.   If your port does not need to stay accessed, no bandage is needed over the port.  WHAT IS FLUSHING? Flushing helps keep the port from getting clogged. Follow your health care provider's instructions on how and when to flush the port. Ports are usually flushed with saline solution or a medicine called heparin. The need for flushing will depend on how the port is used.   If the port is used  for intermittent medicines or blood draws, the port will need to be flushed:   After medicines have been given.   After blood has been drawn.   As part of routine maintenance.   If a constant infusion is running, the port may not need to be flushed.  HOW LONG WILL MY PORT STAY IMPLANTED? The port can stay in for as long as your health care provider thinks it is needed. When it is time for the port to come out, surgery will be done to remove it. The procedure is similar to the one performed when the port was put in.  WHEN SHOULD I SEEK IMMEDIATE MEDICAL CARE? When you have an implanted port, you should seek immediate medical care if:   You notice a bad smell coming from the incision site.   You have swelling, redness, or drainage at the incision site.   You have more swelling or pain at the port site or the surrounding area.  You have a fever that is not controlled with medicine.   This information is not intended to replace advice given to you by your health care provider. Make sure you discuss any questions you have with your health care provider.   Document Released: 11/22/2005 Document Revised: 09/12/2013 Document Reviewed: 07/30/2013 Elsevier Interactive Patient Education 2016 Elsevier Inc.  External Beam Radiation Therapy External beam radiation therapy is a radiation treatment. It may be done to:  Treat cancer. The radiation may be used to:  Destroy cancer cells. Radiation delivered during the treatment damages cancer cells. It also damages normal cells, but normal cells have the DNA to repair themselves while cancer cells do not.  Help with symptoms of your cancer.  Stop the growth of any remaining cancer cells after surgery.  Prevent cancer cells from growing in areas that do not have evidence of cancer (prophylactic radiation therapy).  Treat or shrink a tumor.  Reduce pain (palliative therapy). The therapy delivers higher doses of radiation than X-rays,  CT scans, and most other imaging tests. Compared with internal radiation therapy, external beam radiation therapy can deliver radiation to a fairly large area.  The amount of radiation you will receive and the length of therapy depends on your medical condition. You should not feel the radiation being delivered or any pain during your therapy. RISKS AND COMPLICATIONS Most people experience side effects from the therapy. Side effects depend on the amount of radiation and the part of your body exposed to radiation. For example:  Hair loss may occur if the radiation therapy is directed to your head.  Coughing or difficulty swallowing may occur if the radiation therapy is directed to your head, neck, or chest.  Nausea, vomiting, or diarrhea may occur if the radiation therapy is directed to your abdomen or pelvis.  Bladder problems, frequent urination, or sexual dysfunction may occur if the radiation therapy is directed to your bladder, kidney, or prostate. Regardless of the amount or location of the radiation, you will probably have fatigue. Other side effects may include:  Red, flaking skin in the affected area.  Hair loss in the affected area.  Itching in the affected area. Side effects may take 2-3 weeks to develop. Most side effects are temporary and can be controlled. Once the therapy is complete, side effects will not stop right away. It can take up to 3-4 weeks for you to regain your energy or for side effects to lessen. Your body does heal from the radiation. BEFORE THE PROCEDURE There will be a planning session (simulation). During the session:  Your health care provider will plan exactly where the radiation will be delivered (treatment field).  You will be positioned for your therapy. The goal is to have a position that can be reproduced for each therapy session.  Temporary marks may be drawn on your body. Permanent marks may also be drawn on your body in order for you to be  positioned the same way for each therapy session. PROCEDURE  You will either lie on a table or sit in a chair in the position determined for your therapy.  The radiation machine (linear accelerator) will move around you to deliver the radiation in exact doses from many angles. AFTER THE PROCEDURE You may return to your normal schedule including diet, activities, and medicines, unless your health care provider tells you otherwise.   This information is not intended to replace advice given to you by your health care provider. Make sure you discuss any  questions you have with your health care provider.   Document Released: 04/10/2009 Document Revised: 08/13/2015 Document Reviewed: 10/31/2013 Elsevier Interactive Patient Education [employer].

## 2016-07-23 NOTE — Progress Notes (Unsigned)
Referral sent to [facility],  Dr Rosana Hoes for port placement.  Records faxed to both on 8/18

## 2016-07-27 ENCOUNTER — Encounter (HOSPITAL_COMMUNITY): Payer: Self-pay

## 2016-07-27 ENCOUNTER — Other Ambulatory Visit (HOSPITAL_COMMUNITY): Payer: Self-pay | Admitting: Oncology

## 2016-07-28 ENCOUNTER — Encounter (HOSPITAL_COMMUNITY)
Admission: RE | Admit: 2016-07-28 | Discharge: 2016-07-28 | Disposition: A | Discharge status: Home / Self Care | Payer: Medicare Other | Source: Ambulatory Visit | Attending: Surgery | Admitting: Surgery

## 2016-07-28 ENCOUNTER — Encounter (HOSPITAL_COMMUNITY): Payer: Self-pay | Admitting: Anesthesiology

## 2016-07-28 NOTE — Anesthesia Preprocedure Evaluation (Addendum)
Anesthesia Evaluation  Patient identified by MRN, date of birth, ID band Patient awake    Reviewed: Allergy & Precautions, NPO status , Patient's Chart, lab work & pertinent test results  History of Anesthesia Complications Negative for: history of anesthetic complications  Airway Mallampati: III  TM Distance: >3 FB Neck ROM: Full    Dental  (+) Partial Upper, Dental Advisory Given   Pulmonary neg shortness of breath, asthma (used albuterol this morning) , neg sleep apnea, neg recent URI, Current Smoker,  Chronic cough Adenocarcinoma right lung   Pulmonary exam normal breath sounds clear to auscultation       Cardiovascular (-) angina(-) Past MI, (-) Cardiac Stents, (-) CABG, (-) Orthopnea and (-) PND negative cardio ROS  Dysrhythmias: PVCs.  Rhythm:Regular Rate:Normal     Neuro/Psych neg Seizures PSYCHIATRIC DISORDERS Anxiety Depression Hx/o Arnold-Chiari malformation negative neurological ROS     GI/Hepatic negative GI ROS, Neg liver ROS, GERD  Controlled and Medicated,  Endo/Other  Obesity  Renal/GU negative Renal ROS  negative genitourinary   Musculoskeletal  (+) Arthritis , Lumbar spinal stenosis   Abdominal (+) + obese,   Peds  Hematology negative hematology ROS (+)   Anesthesia Other Findings   Reproductive/Obstetrics                             Lab Results  Component Value Date   WBC 7.8 07/19/2016   HGB 16.3 07/19/2016   HCT 48.0 07/19/2016   MCV 101.1 (H) 07/19/2016   PLT 172 07/19/2016   Lab Results  Component Value Date   CREATININE 0.71 07/19/2016   BUN 11 07/19/2016   NA 140 07/19/2016   K 4.4 07/19/2016   CL 104 07/19/2016   CO2 26 07/19/2016    Anesthesia Physical  Anesthesia Plan  ASA: II  Anesthesia Plan: MAC   Post-op Pain Management:    Induction: Intravenous  Airway Management Planned:   Additional Equipment:   Intra-op Plan:    Post-operative Plan:   Informed Consent: I have reviewed the patients History and Physical, chart, labs and discussed the procedure including the risks, benefits and alternatives for the proposed anesthesia with the patient or authorized representative who has indicated his/her understanding and acceptance.   Dental advisory given  Plan Discussed with: CRNA, Anesthesiologist and Surgeon  Anesthesia Plan Comments:        Anesthesia Quick Evaluation

## 2016-07-30 ENCOUNTER — Ambulatory Visit (HOSPITAL_COMMUNITY)
Admission: RE | Admit: 2016-07-30 | Discharge: 2016-07-30 | Disposition: A | Discharge status: Home / Self Care | Payer: Medicare Other | Source: Ambulatory Visit | Attending: Surgery | Admitting: Surgery

## 2016-07-30 ENCOUNTER — Ambulatory Visit (HOSPITAL_COMMUNITY): Payer: Medicare Other | Admitting: Anesthesiology

## 2016-07-30 ENCOUNTER — Ambulatory Visit (HOSPITAL_COMMUNITY): Payer: Medicare Other

## 2016-07-30 ENCOUNTER — Encounter (HOSPITAL_COMMUNITY)
Admission: RE | Disposition: A | Discharge status: Home / Self Care | Payer: Self-pay | Source: Ambulatory Visit | Attending: Surgery

## 2016-07-30 ENCOUNTER — Other Ambulatory Visit (HOSPITAL_COMMUNITY): Payer: Self-pay | Admitting: Oncology

## 2016-07-30 ENCOUNTER — Encounter (HOSPITAL_COMMUNITY): Payer: Self-pay | Admitting: *Deleted

## 2016-07-30 DIAGNOSIS — J449 Chronic obstructive pulmonary disease, unspecified: Secondary | ICD-10-CM | POA: Diagnosis not present

## 2016-07-30 DIAGNOSIS — C3431 Malignant neoplasm of lower lobe, right bronchus or lung: Secondary | ICD-10-CM | POA: Diagnosis present

## 2016-07-30 DIAGNOSIS — Z79899 Other long term (current) drug therapy: Secondary | ICD-10-CM | POA: Insufficient documentation

## 2016-07-30 DIAGNOSIS — Q0700 Arnold-Chiari syndrome without spina bifida or hydrocephalus: Secondary | ICD-10-CM | POA: Insufficient documentation

## 2016-07-30 DIAGNOSIS — Z6836 Body mass index (BMI) 36.0-36.9, adult: Secondary | ICD-10-CM | POA: Insufficient documentation

## 2016-07-30 DIAGNOSIS — C3491 Malignant neoplasm of unspecified part of right bronchus or lung: Secondary | ICD-10-CM

## 2016-07-30 DIAGNOSIS — F329 Major depressive disorder, single episode, unspecified: Secondary | ICD-10-CM | POA: Insufficient documentation

## 2016-07-30 DIAGNOSIS — N186 End stage renal disease: Secondary | ICD-10-CM

## 2016-07-30 DIAGNOSIS — E669 Obesity, unspecified: Secondary | ICD-10-CM | POA: Insufficient documentation

## 2016-07-30 DIAGNOSIS — K219 Gastro-esophageal reflux disease without esophagitis: Secondary | ICD-10-CM | POA: Insufficient documentation

## 2016-07-30 DIAGNOSIS — F419 Anxiety disorder, unspecified: Secondary | ICD-10-CM | POA: Insufficient documentation

## 2016-07-30 HISTORY — PX: PORTACATH PLACEMENT: SHX2246

## 2016-07-30 SURGERY — INSERTION, TUNNELED CENTRAL VENOUS DEVICE, WITH PORT
Anesthesia: General | Site: Chest | Laterality: Right

## 2016-07-30 MED ORDER — SODIUM CHLORIDE 0.9 % IV SOLN
INTRAVENOUS | Status: DC | PRN
  Administered 2016-07-30: 3 mL via INTRAMUSCULAR

## 2016-07-30 MED ORDER — CEFAZOLIN SODIUM-DEXTROSE 2-4 GM/100ML-% IV SOLN
2.0000 g | INTRAVENOUS | Status: AC
  Administered 2016-07-30: 2 g via INTRAVENOUS
  Filled 2016-07-30: qty 100

## 2016-07-30 MED ORDER — ONDANSETRON HCL 4 MG/2ML IJ SOLN
INTRAMUSCULAR | Status: AC
  Filled 2016-07-30: qty 2

## 2016-07-30 MED ORDER — LACTATED RINGERS IV SOLN
INTRAVENOUS | Status: DC
  Administered 2016-07-30: 08:00:00 via INTRAVENOUS

## 2016-07-30 MED ORDER — MIDAZOLAM HCL 2 MG/2ML IJ SOLN
2.0000 mg | INTRAMUSCULAR | Status: AC
  Administered 2016-07-30 (×2): 2 mg via INTRAVENOUS
  Filled 2016-07-30: qty 2

## 2016-07-30 MED ORDER — LIDOCAINE HCL (PF) 1 % IJ SOLN
INTRAMUSCULAR | Status: AC
  Filled 2016-07-30: qty 30

## 2016-07-30 MED ORDER — FENTANYL CITRATE (PF) 100 MCG/2ML IJ SOLN
INTRAMUSCULAR | Status: AC
  Filled 2016-07-30: qty 2

## 2016-07-30 MED ORDER — ONDANSETRON HCL 4 MG/2ML IJ SOLN
INTRAMUSCULAR | Status: DC | PRN
  Administered 2016-07-30: 4 mg via INTRAVENOUS

## 2016-07-30 MED ORDER — MIDAZOLAM HCL 2 MG/2ML IJ SOLN
INTRAMUSCULAR | Status: AC
  Filled 2016-07-30: qty 2

## 2016-07-30 MED ORDER — CHLORHEXIDINE GLUCONATE CLOTH 2 % EX PADS: 6.0000 | MEDICATED_PAD | Freq: Once | CUTANEOUS | Status: DC

## 2016-07-30 MED ORDER — PROPOFOL 500 MG/50ML IV EMUL
INTRAVENOUS | Status: DC | PRN
  Administered 2016-07-30: 50 ug/kg/min via INTRAVENOUS

## 2016-07-30 MED ORDER — HEPARIN SOD (PORK) LOCK FLUSH 100 UNIT/ML IV SOLN
INTRAVENOUS | Status: DC | PRN
  Administered 2016-07-30: 500 [IU]

## 2016-07-30 MED ORDER — HEPARIN SOD (PORK) LOCK FLUSH 100 UNIT/ML IV SOLN
INTRAVENOUS | Status: AC
  Filled 2016-07-30: qty 5

## 2016-07-30 MED ORDER — LIDOCAINE-EPINEPHRINE (PF) 1 %-1:200000 IJ SOLN
INTRAMUSCULAR | Status: DC | PRN
  Administered 2016-07-30: 18 mL

## 2016-07-30 MED ORDER — HEPARIN SODIUM (PORCINE) 1000 UNIT/ML IJ SOLN
INTRAMUSCULAR | Status: AC
  Filled 2016-07-30: qty 4

## 2016-07-30 MED ORDER — BUPIVACAINE HCL (PF) 0.5 % IJ SOLN
INTRAMUSCULAR | Status: AC
  Filled 2016-07-30: qty 30

## 2016-07-30 SURGICAL SUPPLY — 34 items
APPLIER CLIP 9.375 SM OPEN (CLIP)
BAG DECANTER FOR FLEXI CONT (MISCELLANEOUS) ×3 IMPLANT
BAG HAMPER (MISCELLANEOUS) ×3 IMPLANT
BLADE SURG SZ11 CARB STEEL (BLADE) ×3 IMPLANT
CHLORAPREP W/TINT 10.5 ML (MISCELLANEOUS) ×6 IMPLANT
CLIP APPLIE 9.375 SM OPEN (CLIP) IMPLANT
CLOTH BEACON ORANGE TIMEOUT ST (SAFETY) ×3 IMPLANT
COVER LIGHT HANDLE STERIS (MISCELLANEOUS) ×6 IMPLANT
DECANTER SPIKE VIAL GLASS SM (MISCELLANEOUS) ×6 IMPLANT
DERMABOND ADVANCED (GAUZE/BANDAGES/DRESSINGS) ×4
DERMABOND ADVANCED .7 DNX12 (GAUZE/BANDAGES/DRESSINGS) ×2 IMPLANT
DRAPE C-ARM FOLDED MOBILE STRL (DRAPES) ×3 IMPLANT
ELECT REM PT RETURN 9FT ADLT (ELECTROSURGICAL) ×3
ELECTRODE REM PT RTRN 9FT ADLT (ELECTROSURGICAL) ×1 IMPLANT
GLOVE BIOGEL PI IND STRL 7.0 (GLOVE) ×1 IMPLANT
GLOVE BIOGEL PI IND STRL 7.5 (GLOVE) ×1 IMPLANT
GLOVE BIOGEL PI INDICATOR 7.0 (GLOVE) ×2
GLOVE BIOGEL PI INDICATOR 7.5 (GLOVE) ×2
GLOVE ECLIPSE 7.0 STRL STRAW (GLOVE) ×3 IMPLANT
GOWN STRL REUS W/TWL LRG LVL3 (GOWN DISPOSABLE) ×6 IMPLANT
IV NS 500ML (IV SOLUTION) ×2
IV NS 500ML BAXH (IV SOLUTION) ×1 IMPLANT
KIT PORT POWER 8FR ISP MRI (Port) ×3 IMPLANT
KIT ROOM TURNOVER APOR (KITS) ×3 IMPLANT
MANIFOLD NEPTUNE II (INSTRUMENTS) ×3 IMPLANT
NEEDLE HYPO 25X1 1.5 SAFETY (NEEDLE) ×3 IMPLANT
PACK MINOR (CUSTOM PROCEDURE TRAY) ×3 IMPLANT
PAD ARMBOARD 7.5X6 YLW CONV (MISCELLANEOUS) ×3 IMPLANT
SET BASIN LINEN APH (SET/KITS/TRAYS/PACK) ×3 IMPLANT
SUT VIC AB 3-0 SH 27 (SUTURE) ×2
SUT VIC AB 3-0 SH 27X BRD (SUTURE) ×1 IMPLANT
SUT VIC AB 4-0 PS2 27 (SUTURE) ×3 IMPLANT
SYR 20CC LL (SYRINGE) ×3 IMPLANT
SYR CONTROL 10ML LL (SYRINGE) ×3 IMPLANT

## 2016-07-30 NOTE — Op Note (Signed)
SURGICAL PROCEDURE REPORT  DATE OF PROCEDURE: 07/30/2016   ATTENDING SURGEON: Corene Cornea E. Rosana Hoes, MD   ANESTHESIA: Local with light IV sedation   PRE-OPERATIVE DIAGNOSIS: Advanced lung cancer requiring durable central venous access for chemotherapy (ICD-10's: C34.91)  POST-OPERATIVE DIAGNOSIS: Advanced lung cancer requiring durable central venous access for chemotherapy (ICD-10's: C34.91)  PROCEDURE(S): (cpt's: 28786) 1.) Percutaneous access of Right internal jugular vein under ultrasound guidance  2.) Insertion of tunneled Right internal jugular Bard PowerPort central venous catheter with subcutaneous port  INTRAOPERATIVE FINDINGS: Patent easily compressible Right internal jugular vein with appropriate respiratory variations and well-secured tunneled central venous catheter with subcutaneous port at completion of the procedure  INTRAOPERATIVE FLUIDS: 500 mL crystalloid, 0 mL contrast used   FLUOROSCOPY: 1 second  ESTIMATED BLOOD LOSS: Minimal (<20 mL)   SPECIMENS: None   IMPLANTS: 95F tunneled Bard PowerPort central venous catheter with subcutaneous port  DRAINS: None   COMPLICATIONS: None apparent   CONDITION AT COMPLETION: Hemodynamically stable, awake   DISPOSITION: PACU   INDICATION(S) FOR PROCEDURE:  Patient is a 55 y.o. male who presented with advanced lung cancer requiring durable central venous access for chemotherapy. All risks, benefits, and alternatives to above elective procedures were discussed with the patient, who elected to proceed, and informed consent was accordingly obtained at that time.  DETAILS OF PROCEDURE:  Patient was brought to the operative suite and appropriately identified. In Trendelenburg position, Right IJ venous access site was prepped and draped in the usual sterile fashion, and following a brief timeout, limited duplex evaluation of the Right internal jugular vein was performed. Percutaneous Right IJ venous access was obtained under ultrasound  guidance using Seldinger technique, by which local anesthetic was injected over the Right IJ vein, and access needle was inserted under direct ultrasound visualization into the Right IJ vein, through which soft guidewire was advanced, over which access needle was withdrawn. Guidewire was secured, attention was directed to injection of local anesthetic along the planned tunnel site, 2-3 cm transverse Right chest incision was made and confirmed to accommodate the subcutaneous port, and flushed catheter was tunneled retrograde from the port site over the Right chest to the Right IJ access site with the attached port well-secured to the catheter and within the subcutaneous pocket. Insertion sheath was advanced over the guidewire, which was withdrawn along with the insertion sheath dilator. Length of catheter needed to position the catheter tip at the atrio-caval junction was then measured under direct fluoroscopic visualization, after which the catheter was cut to the measured length and advanced through the sheath into the Right internal jugular vein and SVC without evidence of cardiac arrhythmias during the procedure. Port was confirmed to withdraw blood and flush easily, after which concentrated heparin was instilled into the port and catheter. Dermis at the subcutaneous pocket was re-approximated using buried interrupted 3-0 Vicryl suture, and 4-0 Vicryl suture was used to re-approximate skin at the insertion and subcutaneous port sites in running subcuticular fashion for the subcutaneous port and buried interrupted fashion for the insertion site. Skin was cleaned, dried, and sterile skin glue was applied. Patient was then safely transferred to PACU for a chest x-ray.  I was present for all aspects of the procedures, and there were no intraprocedural complications apparent.

## 2016-07-30 NOTE — H&P (Signed)
Surgical History and Physical  History of Present Illness: This 55 year old male with recently diagnosed advanced Right lower lobe non-small cell lung cancer presents to discuss and schedule placement of a central venous catheter with port for chemotherapy. Patient denies any prior unilateral upper extremity swelling, DVT, or indwelling catheter.  Review of Symptoms:  Constitutional:No fevers, chills, or unexplained weight loss Head:Atraumatic; no masses; no abnormalities Eyes:No visual changes or eye pain Cardiovascular: No chest pain or palpitations.  Respiratory:+Wheezing, +recently diagnosed lung cancer, no new cough or shortness of breath  GastrointestinNo diarrhea, constipation, blood in stools, abdominal pain, vomiting or heartburn Musculoskeletal:No arthalgias, myalgias or joint swelling Skin:No rash or bothersome skin lesions   Past Medical History:Reviewed  Past Medical History  Surgical History:  spine surgery x5 (fusions x2), Right lung biopsy Medical Problems:  Right lower lobe lung NSCLC (adenocarcinoma vs squamous), COPD, spinal stenosis, osteoarthritis, degenerative spine disease, GERD Psychiatric History:  major depression disorder Allergies:   tetracyclines Medications:   albuterol, xanax 1 mg, norvasc 5 mg, symbicort, lexapro 20 mg, neurontin 600 mg TID, methadone 20 mg q6h, zanaflex, chantix   Social History:Reviewed  Social History  Preferred Language: English Race:  White Ethnicity: Not Hispanic / Latino Age: 55 year Marital Status:  S Alcohol:  8 beers per week  Smoking Status: Current every day smoker reviewed on 07/27/2016 Started Date: 07/27/1974 Packs per day: 2.00  Functional Status ------------------------------------------------ Bathing: Normal Cooking: Normal Dressing: Normal Driving: Normal Eating: Normal Managing Meds: Normal Oral Care: Normal Shopping: Normal Toileting: Normal Transferring: Normal Walking:  Normal  Cognitive Status ------------------------------------------------ Attention: Normal Decision Making: Normal Language: Normal Memory: Normal Motor: Normal Perception: Normal Problem Solving: Normal Visual and Spatial: Normal   Family History:Reviewed  Family Health History Mother, Living; Breast cancer;  Father, Deceased; Pancreatic cancer;  Aunt, Living; Cancer unspecified;   Vital Signs as of 5/39/7673:  Systolic 419: Diastolic 379: Heart Rate 91: Temp 98.66F (Temporal) Height 72f 0in: Weight 262Lbs 0 Ounces  BMI: 35.53 kg/m2  Physical Exam: General:Well appearing, well nourished in no distress. Skin:no rash or prominent lesions Head:Atraumatic; no masses; no abnormalities Eyes:conjunctiva clear, EOM intact, PERRL Neck:Supple without lymphadenopathy.  Heart:RRR, no murmur Lungs: +Wheezes bilaterally; no rhonchi, rales.  Breathing unlabored. Abdomen:Soft, NT/ND, no HSM, no masses. Extremities:No deformities, clubbing, cyanosis, or edema.   Assessment: 55year old Male with advanced non-small cell lung cancer requiring durable central venous access for chemotherapy  Plan:       - All risks, benefits, and alternatives to insertion of central venous catheter with port discussed with the patient, who elects to proceed, and informed consent was obtained       - Will plan for elective outpatient placement of central venous catheter with port       - 55year old Male with advanced non-small cell lung cancer requiring durable central venous access for chemotherapy  -- JCorene CorneaE. DRosana Hoes MD, [facility] Office #: [phone]

## 2016-07-30 NOTE — Transfer of Care (Signed)
Immediate Anesthesia Transfer of Care Note  Patient: Erik [name]  Procedure(s) Performed: Procedure(s): INSERTION PORT-A-CATH (Right)  Patient Location: PACU  Anesthesia Type:MAC  Level of Consciousness: awake and patient cooperative  Airway & Oxygen Therapy: Patient Spontanous Breathing and non-rebreather face mask  Post-op Assessment: Report given to RN and Post -op Vital signs reviewed and stable  Post vital signs: Reviewed and stable  Last Vitals:  Vitals:   07/30/16 0855 07/30/16 1011  BP:  131/89  Pulse:  81  Resp: 20 (!) 24  Temp:  37.2 C    Last Pain: There were no vitals filed for this visit.    Patients Stated Pain Goal: 5 (76/80/88 1103)  Complications: No apparent anesthesia complications

## 2016-07-30 NOTE — Interval H&P Note (Signed)
History and Physical Interval Note:  07/30/2016 7:31 AM  Erik [name]  has presented today for surgery, with the diagnosis of lung cancer  The various methods of treatment have been discussed with the patient and family. After consideration of risks, benefits and other options for treatment, the patient has consented to  Procedure(s): INSERTION PORT-A-CATH (N/A) as a surgical intervention .  The patient's history has been reviewed, patient examined, no change in status, stable for surgery.  I have reviewed the patient's chart and labs.  Questions were answered to the patient's satisfaction.     Vickie Epley

## 2016-07-30 NOTE — Discharge Instructions (Signed)
In addition to included general post-operative instructions for Insertion of Central Venous Catheter with subcutaneous Port for Chemotherapy,  Diet: Resume home heart healthy diet.   Activity: No heavy lifting (children, pets, laundry) or strenuous activity until follow-up, but light activity and walking are encouraged. Do not drive or drink alcohol if taking narcotic pain medications.   Wound care: 2 days after surgery (Sunday, 8/27), may shower/get incision wet with soapy water and pat dry (do not rub incisions), but no baths or submerging incision underwater until follow-up.   Medications: Resume all home medications. For mild to moderate pain: acetaminophen (Tylenol) or ibuprofen (if no kidney disease). Narcotic pain medications, if prescribed, can be used for severe pain, though may cause nausea, constipation, and drowsiness. Do not combine Tylenol and Percocet within a 6 hour period as Percocet contains Tylenol. If you do not need the narcotic pain medication, you do not need to fill the prescription.  Call office [phone]) at any time if any questions, worsening pain, fevers/chills, bleeding, drainage from incision site, or other concerns.

## 2016-07-30 NOTE — Anesthesia Postprocedure Evaluation (Signed)
Anesthesia Post Note  Patient: Erik [name]  Procedure(s) Performed: Procedure(s) (LRB): INSERTION PORT-A-CATH (Right)  Patient location during evaluation: PACU Anesthesia Type: MAC Level of consciousness: awake and alert and oriented Pain management: pain level controlled Vital Signs Assessment: post-procedure vital signs reviewed and stable Respiratory status: spontaneous breathing, nonlabored ventilation and respiratory function stable Cardiovascular status: stable and blood pressure returned to baseline Postop Assessment: no signs of nausea or vomiting Anesthetic complications: no    Last Vitals:  Vitals:   07/30/16 1015 07/30/16 1030  BP: (!) 157/89   Pulse: 73 76  Resp: 11 11  Temp:      Last Pain:  Vitals:   07/30/16 1030  PainSc: (P) 0-No pain                 [name] A.

## 2016-07-30 NOTE — Anesthesia Procedure Notes (Signed)
Procedure Name: MAC Date/Time: 07/30/2016 8:55 AM Performed by: Vista Deck Pre-anesthesia Checklist: Patient identified, Emergency Drugs available, Suction available, Timeout performed and Patient being monitored Patient Re-evaluated:Patient Re-evaluated prior to inductionOxygen Delivery Method: Non-rebreather mask

## 2016-08-02 MED ORDER — PROCHLORPERAZINE MALEATE 10 MG PO TABS: 10.0000 mg | ORAL_TABLET | Freq: Four times a day (QID) | ORAL | 2 refills | Status: DC | PRN

## 2016-08-02 MED ORDER — LIDOCAINE-PRILOCAINE 2.5-2.5 % EX CREA: TOPICAL_CREAM | CUTANEOUS | 2 refills | Status: DC

## 2016-08-02 MED ORDER — ONDANSETRON HCL 8 MG PO TABS: 8.0000 mg | ORAL_TABLET | Freq: Three times a day (TID) | ORAL | 2 refills | Status: DC | PRN

## 2016-08-02 NOTE — Patient Instructions (Addendum)
[place]   CHEMOTHERAPY INSTRUCTIONS   Days 1, 8, 29, 36:  Premeds: Aloxi - for nausea/vomiting prevention/reduction. Emend - for nausea/vomiting prevention/reduction. Dexamethasone - steroid - given to reduce the risk of you having an allergic type reaction to the chemotherapy. Dex can cause you to feel energized, nervous/anxious/jittery, make you have trouble sleeping, and/or make you feel hot/flushed in the face/neck and/or look pink/red in the face/neck. These side effects will pass as the Dex wears off. (takes 15 minutes to infuse)   Cisplatin - this medication is hard on your kidneys. This is why we give you a large amount of fluids through your IV while you are here getting chemo. We also need you drinking 64 oz of fluid (preferably water/decaff fluids) 2 days prior to chemo and for up to 4-5 days after chemo. Drink more if you can. This will help keep your kidneys flushed. This can also cause acute and/or delayed nausea/vomiting. You must take your nausea meds as prescribed if you get nauseated. Do not wait until you start vomiting. This med can also cause peripheral neuropathy (numbness/tingling/burning in hands/fingers/feet/toes). Let us know if this develops so that we can monitor it and treat if necessary. (Takes 1 hour to infuse)  (You will receive IV fluids along with Cisplatin on Days 1,8,29,36)   Days 1, 2,3,4,5, 29, 30, 31, 32, 33:  Premeds: If you are only receiving Etoposide you will only receive Dexamethasone prior to Etoposide. If you are receiving Cisplatin & Etoposide you will receive all 3 of the above premeds.   VP16 (Etoposide) - this medication can lower your blood pressure. We will monitor this during chemo. Bone marrow suppression (lowers white blood cells (fight infection), lowers red blood cells (make up your blood), lower platelets (help blood to clot). Hair loss, loss of appetite. (takes 1 hour to infuse)    POTENTIAL SIDE  EFFECTS OF TREATMENT: Increased Susceptibility to Infection, Vomiting, Constipation, Hair Thinning, Changes in Character of Skin and Nails (brittleness, dryness,etc.), Pigment Changes (darkening of veins, nail beds, palms of hands, soles of feet, etc.), Bone Marrow Suppression, Abdominal Cramping, Complete Hair Loss, Nausea, Diarrhea, Sun Sensitivity and Mouth Sores    EDUCATIONAL MATERIALS GIVEN AND REVIEWED: Chemotherapy and You book Specific Instructions Sheets: Cisplatin, Etoposide, Zofran, Dexamethasone, EMLA cream   SELF CARE ACTIVITIES WHILE ON CHEMOTHERAPY: Increase your fluid intake 48 hours prior to treatment and drink at least 2 quarts per day after treatment., No alcohol intake., No aspirin or other medications unless approved by your oncologist., Eat foods that are light and easy to digest., Eat foods at cold or room temperature., No fried, fatty, or spicy foods immediately before or after treatment., Have teeth cleaned professionally before starting treatment. Keep dentures and partial plates clean., Use soft toothbrush and do not use mouthwashes that contain alcohol. Biotene is a good mouthwash that is available at most pharmacies or may be ordered by calling [phone]., Use warm salt water gargles (1 teaspoon salt per 1 quart warm water) before and after meals and at bedtime. Or you may rinse with 2 tablespoons of three -percent hydrogen peroxide mixed in eight ounces of water., Always use sunscreen with SPF (Sun Protection Factor) of 30 or higher., Use your nausea medication as directed to prevent nausea., Use your stool softener or laxative as directed to prevent constipation. and Use your anti-diarrheal medication as directed to stop diarrhea.  Please wash your hands for at least 30 seconds using warm  soapy water. Handwashing is the #1 way to prevent the spread of germs. Stay away from sick people or people who are getting over a cold. If you develop respiratory systems such as  green/yellow mucus production or productive cough or persistent cough let us know and we will see if you need an antibiotic. It is a good idea to keep a pair of gloves on when going into grocery stores/[employer] to decrease your risk of coming into contact with germs on the carts, etc. Carry alcohol hand gel with you at all times and use it frequently if out in public. All foods need to be cooked thoroughly. No raw foods. No medium or undercooked meats, eggs. If your food is cooked medium well, it does not need to be hot pink or saturated with bloody liquid at all. Vegetables and fruits need to be washed/rinsed under the faucet with a dish detergent before being consumed. You can eat raw fruits and vegetables unless we tell you otherwise but it would be best if you cooked them or bought frozen. Do not eat off of salad bars or hot bars unless you really trust the cleanliness of the restaurant. If you need dental work, please let Dr. Whitney Muse know before you go for your appointment so that we can coordinate the best possible time for you in regards to your chemo regimen. You need to also let your dentist know that you are actively taking chemo. We may need to do labs prior to your dental appointment. We also want your bowels moving at least every other day. If this is not happening, we need to know so that we can get you on a bowel regimen to help you go. If you are going to have sex, you need to use a condom. This is to protect your partner from potential chemotherapy exposure. This will need to be done for 28 days following your last chemotherapy infusion.    MEDICATIONS: You have been given prescriptions for the following medications:  Zofran/Ondansetron '8mg'$  tablet. Take 1 tablet every 8 hours as needed for nausea/vomiting. (#1 nausea med to take, this can constipate)  Compazine/Prochlorperazine '10mg'$  tablet. Take 1 tablet every 6 hours as needed for nausea/vomiting. (#2 nausea med to take, this can make you  sleepy)   EMLA cream. Apply a quarter size amount to port site 1 hour prior to chemo. Do not rub in. Cover with plastic wrap.   Over-the-Counter Meds:  Miralax 1 capful in 8 oz of fluid daily. May increase to two times a day if needed. This is a stool softener. If this doesn't work proceed you can add:  Senokot S  - start with 1 tablet two times a day and increase to 4 tablets two times a day if needed. (total of 8 tablets in a 24 hour period). This is a stimulant laxative.   Call us if this does not help your bowels move.   Imodium '2mg'$  capsule. Take 2 capsules after the 1st loose stool and then 1 capsule every 2 hours until you go a total of 12 hours without having a loose stool. Call the [facility] if loose stools continue. If diarrhea occurs @ bedtime, take 2 capsules @ bedtime. Then take 2 capsules every 4 hours until morning. Call [facility].   SYMPTOMS TO REPORT AS SOON AS POSSIBLE AFTER TREATMENT:  FEVER GREATER THAN 100.5 F  CHILLS WITH OR WITHOUT FEVER  NAUSEA AND VOMITING THAT IS NOT CONTROLLED WITH YOUR NAUSEA MEDICATION  UNUSUAL SHORTNESS OF BREATH  UNUSUAL BRUISING OR BLEEDING  TENDERNESS IN MOUTH AND THROAT WITH OR WITHOUT PRESENCE OF ULCERS  URINARY PROBLEMS  BOWEL PROBLEMS  UNUSUAL RASH    Wear comfortable clothing and clothing appropriate for easy access to any Portacath or PICC line. Let us know if there is anything that we can do to make your therapy better!      I have been informed and understand all of the instructions given to me and have received a copy. I have been instructed to call the clinic [phone] or my family physician as soon as possible for continued medical care, if indicated. I do not have any more questions at this time but understand that I may call the [facility] or the Patient Navigator at [phone] during office hours should I have questions or need assistance in obtaining follow-up  care.          Cisplatin injection What is this medicine? CISPLATIN (SIS pla tin) is a chemotherapy drug. It targets fast dividing cells, like cancer cells, and causes these cells to die. This medicine is used to treat many types of cancer like bladder, ovarian, and testicular cancers. This medicine may be used for other purposes; ask your health care provider or pharmacist if you have questions. What should I tell my health care provider before I take this medicine? They need to know if you have any of these conditions: -blood disorders -hearing problems -kidney disease -recent or ongoing radiation therapy -an unusual or allergic reaction to cisplatin, carboplatin, other chemotherapy, other medicines, foods, dyes, or preservatives -pregnant or trying to get pregnant -breast-feeding How should I use this medicine? This drug is given as an infusion into a vein. It is administered in a hospital or clinic by a specially trained health care professional. Talk to your pediatrician regarding the use of this medicine in children. Special care may be needed. Overdosage: If you think you have taken too much of this medicine contact a poison control center or emergency room at once. NOTE: This medicine is only for you. Do not share this medicine with others. What if I miss a dose? It is important not to miss a dose. Call your doctor or health care professional if you are unable to keep an appointment. What may interact with this medicine? -dofetilide -foscarnet -medicines for seizures -medicines to increase blood counts like filgrastim, pegfilgrastim, sargramostim -probenecid -pyridoxine used with altretamine -rituximab -some antibiotics like amikacin, gentamicin, neomycin, polymyxin B, streptomycin, tobramycin -sulfinpyrazone -vaccines -zalcitabine Talk to your doctor or health care professional before taking any of these  medicines: -acetaminophen -aspirin -ibuprofen -ketoprofen -naproxen This list may not describe all possible interactions. Give your health care provider a list of all the medicines, herbs, non-prescription drugs, or dietary supplements you use. Also tell them if you smoke, drink alcohol, or use illegal drugs. Some items may interact with your medicine. What should I watch for while using this medicine? Your condition will be monitored carefully while you are receiving this medicine. You will need important blood work done while you are taking this medicine. This drug may make you feel generally unwell. This is not uncommon, as chemotherapy can affect healthy cells as well as cancer cells. Report any side effects. Continue your course of treatment even though you feel ill unless your doctor tells you to stop. In some cases, you may be given additional medicines to help with side effects. Follow all directions for their use. Call your  doctor or health care professional for advice if you get a fever, chills or sore throat, or other symptoms of a cold or flu. Do not treat yourself. This drug decreases your body's ability to fight infections. Try to avoid being around people who are sick. This medicine may increase your risk to bruise or bleed. Call your doctor or health care professional if you notice any unusual bleeding. Be careful brushing and flossing your teeth or using a toothpick because you may get an infection or bleed more easily. If you have any dental work done, tell your dentist you are receiving this medicine. Avoid taking products that contain aspirin, acetaminophen, ibuprofen, naproxen, or ketoprofen unless instructed by your doctor. These medicines may hide a fever. Do not become pregnant while taking this medicine. Women should inform their doctor if they wish to become pregnant or think they might be pregnant. There is a potential for serious side effects to an unborn child. Talk to  your health care professional or pharmacist for more information. Do not breast-feed an infant while taking this medicine. Drink fluids as directed while you are taking this medicine. This will help protect your kidneys. Call your doctor or health care professional if you get diarrhea. Do not treat yourself. What side effects may I notice from receiving this medicine? Side effects that you should report to your doctor or health care professional as soon as possible: -allergic reactions like skin rash, itching or hives, swelling of the face, lips, or tongue -signs of infection - fever or chills, cough, sore throat, pain or difficulty passing urine -signs of decreased platelets or bleeding - bruising, pinpoint red spots on the skin, black, tarry stools, nosebleeds -signs of decreased red blood cells - unusually weak or tired, fainting spells, lightheadedness -breathing problems -changes in hearing -gout pain -low blood counts - This drug may decrease the number of white blood cells, red blood cells and platelets. You may be at increased risk for infections and bleeding. -nausea and vomiting -pain, swelling, redness or irritation at the injection site -pain, tingling, numbness in the hands or feet -problems with balance, movement -trouble passing urine or change in the amount of urine Side effects that usually do not require medical attention (report to your doctor or health care professional if they continue or are bothersome): -changes in vision -loss of appetite -metallic taste in the mouth or changes in taste This list may not describe all possible side effects. Call your doctor for medical advice about side effects. You may report side effects to FDA at 1-800-FDA-1088. Where should I keep my medicine? This drug is given in a hospital or clinic and will not be stored at home. NOTE: This sheet is a summary. It may not cover all possible information. If you have questions about this medicine,  talk to your doctor, pharmacist, or health care provider.    2016, Elsevier/Gold Standard. (2008-02-27 14:40:54) Etoposide, VP-16 injection What is this medicine? ETOPOSIDE, VP-16 (e toe POE side) is a chemotherapy drug. It is used to treat testicular cancer, lung cancer, and other cancers. This medicine may be used for other purposes; ask your health care provider or pharmacist if you have questions. What should I tell my health care provider before I take this medicine? They need to know if you have any of these conditions: -infection -kidney disease -low blood counts, like low white cell, platelet, or red cell counts -an unusual or allergic reaction to etoposide, other chemotherapeutic agents, other medicines, foods,  dyes, or preservatives -pregnant or trying to get pregnant -breast-feeding How should I use this medicine? This medicine is for infusion into a vein. It is administered in a hospital or clinic by a specially trained health care professional. Talk to your pediatrician regarding the use of this medicine in children. Special care may be needed. Overdosage: If you think you have taken too much of this medicine contact a poison control center or emergency room at once. NOTE: This medicine is only for you. Do not share this medicine with others. What if I miss a dose? It is important not to miss your dose. Call your doctor or health care professional if you are unable to keep an appointment. What may interact with this medicine? -aspirin -certain medications for seizures like carbamazepine, phenobarbital, phenytoin, valproic acid -cyclosporine -levamisole -warfarin This list may not describe all possible interactions. Give your health care provider a list of all the medicines, herbs, non-prescription drugs, or dietary supplements you use. Also tell them if you smoke, drink alcohol, or use illegal drugs. Some items may interact with your medicine. What should I watch for while  using this medicine? Visit your doctor for checks on your progress. This drug may make you feel generally unwell. This is not uncommon, as chemotherapy can affect healthy cells as well as cancer cells. Report any side effects. Continue your course of treatment even though you feel ill unless your doctor tells you to stop. In some cases, you may be given additional medicines to help with side effects. Follow all directions for their use. Call your doctor or health care professional for advice if you get a fever, chills or sore throat, or other symptoms of a cold or flu. Do not treat yourself. This drug decreases your body's ability to fight infections. Try to avoid being around people who are sick. This medicine may increase your risk to bruise or bleed. Call your doctor or health care professional if you notice any unusual bleeding. Be careful brushing and flossing your teeth or using a toothpick because you may get an infection or bleed more easily. If you have any dental work done, tell your dentist you are receiving this medicine. Avoid taking products that contain aspirin, acetaminophen, ibuprofen, naproxen, or ketoprofen unless instructed by your doctor. These medicines may hide a fever. Do not become pregnant while taking this medicine or for at least 6 months after stopping it. Women should inform their doctor if they wish to become pregnant or think they might be pregnant. Women of child-bearing potential will need to have a negative pregnancy test before starting this medicine. There is a potential for serious side effects to an unborn child. Talk to your health care professional or pharmacist for more information. Do not breast-feed an infant while taking this medicine. Men must use a latex condom during sexual contact with a woman while taking this medicine and for at least 4 months after stopping it. A latex condom is needed even if you have had a vasectomy. Contact your doctor right away if your  partner becomes pregnant. Do not donate sperm while taking this medicine and for at least 4 months after you stop taking this medicine. Men should inform their doctors if they wish to father a child. This medicine may lower sperm counts. What side effects may I notice from receiving this medicine? Side effects that you should report to your doctor or health care professional as soon as possible: -allergic reactions like skin rash, itching or  hives, swelling of the face, lips, or tongue -low blood counts - this medicine may decrease the number of white blood cells, red blood cells and platelets. You may be at increased risk for infections and bleeding. -signs of infection - fever or chills, cough, sore throat, pain or difficulty passing urine -signs of decreased platelets or bleeding - bruising, pinpoint red spots on the skin, black, tarry stools, blood in the urine -signs of decreased red blood cells - unusually weak or tired, fainting spells, lightheadedness -breathing problems -changes in vision -mouth or throat sores or ulcers -pain, redness, swelling or irritation at the injection site -pain, tingling, numbness in the hands or feet -redness, blistering, peeling or loosening of the skin, including inside the mouth -seizures -vomiting Side effects that usually do not require medical attention (report to your doctor or health care professional if they continue or are bothersome): -diarrhea -hair loss -loss of appetite -nausea -stomach pain This list may not describe all possible side effects. Call your doctor for medical advice about side effects. You may report side effects to FDA at 1-800-FDA-1088. Where should I keep my medicine? This drug is given in a hospital or clinic and will not be stored at home. NOTE: This sheet is a summary. It may not cover all possible information. If you have questions about this medicine, talk to your doctor, pharmacist, or health care provider.    2016,  Elsevier/Gold Standard. (2014-07-18 12:32:50)

## 2016-08-03 ENCOUNTER — Other Ambulatory Visit (HOSPITAL_COMMUNITY): Payer: Self-pay | Admitting: Emergency Medicine

## 2016-08-03 DIAGNOSIS — C3491 Malignant neoplasm of unspecified part of right bronchus or lung: Secondary | ICD-10-CM

## 2016-08-04 ENCOUNTER — Encounter (HOSPITAL_COMMUNITY): Payer: Self-pay | Admitting: Surgery

## 2016-08-04 ENCOUNTER — Encounter (HOSPITAL_COMMUNITY): Payer: Medicare Other

## 2016-08-04 DIAGNOSIS — C3491 Malignant neoplasm of unspecified part of right bronchus or lung: Secondary | ICD-10-CM

## 2016-08-10 LAB — JAK2 V617F, W REFLEX TO CALR/E12/MPL

## 2016-08-10 LAB — CALR + JAK2 E12-15 + MPL (REFLEXED)

## 2016-08-11 ENCOUNTER — Telehealth (HOSPITAL_COMMUNITY): Payer: Self-pay | Admitting: *Deleted

## 2016-08-11 NOTE — Telephone Encounter (Signed)
-----   Message from [facility], PA-C sent at 08/10/2016  4:33 PM EDT ----- Negative.

## 2016-08-11 NOTE — Telephone Encounter (Signed)
Pt aware that labs were negative.

## 2016-08-16 ENCOUNTER — Ambulatory Visit (HOSPITAL_COMMUNITY): Payer: Medicare Other

## 2016-08-16 MED ORDER — POTASSIUM CHLORIDE 2 MEQ/ML IV SOLN
Freq: Once | INTRAVENOUS | Status: DC
  Filled 2016-08-16: qty 10

## 2016-08-17 ENCOUNTER — Telehealth (HOSPITAL_COMMUNITY): Payer: Self-pay | Admitting: Emergency Medicine

## 2016-08-17 ENCOUNTER — Encounter: Payer: Self-pay | Admitting: *Deleted

## 2016-08-17 ENCOUNTER — Other Ambulatory Visit (HOSPITAL_COMMUNITY): Payer: Self-pay | Admitting: Emergency Medicine

## 2016-08-17 ENCOUNTER — Encounter (HOSPITAL_COMMUNITY): Payer: Medicare Other | Attending: Hematology

## 2016-08-17 VITALS — BP 139/74 | HR 86 | Temp 98.2°F | Resp 16 | Ht 72.0 in | Wt 260.0 lb

## 2016-08-17 DIAGNOSIS — C3431 Malignant neoplasm of lower lobe, right bronchus or lung: Secondary | ICD-10-CM | POA: Diagnosis not present

## 2016-08-17 DIAGNOSIS — R739 Hyperglycemia, unspecified: Secondary | ICD-10-CM

## 2016-08-17 DIAGNOSIS — Z5111 Encounter for antineoplastic chemotherapy: Secondary | ICD-10-CM | POA: Diagnosis present

## 2016-08-17 DIAGNOSIS — Z23 Encounter for immunization: Secondary | ICD-10-CM | POA: Diagnosis not present

## 2016-08-17 DIAGNOSIS — D751 Secondary polycythemia: Secondary | ICD-10-CM | POA: Diagnosis not present

## 2016-08-17 DIAGNOSIS — C3491 Malignant neoplasm of unspecified part of right bronchus or lung: Secondary | ICD-10-CM | POA: Insufficient documentation

## 2016-08-17 LAB — COMPREHENSIVE METABOLIC PANEL
ALT: 42 U/L (ref 17–63)
AST: 41 U/L (ref 15–41)
Albumin: 3.8 g/dL (ref 3.5–5.0)
Alkaline Phosphatase: 100 U/L (ref 38–126)
Anion gap: 10 (ref 5–15)
BUN: 20 mg/dL (ref 6–20)
CHLORIDE: 100 mmol/L — AB (ref 101–111)
CO2: 27 mmol/L (ref 22–32)
CREATININE: 0.86 mg/dL (ref 0.61–1.24)
Calcium: 9.2 mg/dL (ref 8.9–10.3)
Glucose, Bld: 213 mg/dL — ABNORMAL HIGH (ref 65–99)
POTASSIUM: 3.8 mmol/L (ref 3.5–5.1)
Sodium: 137 mmol/L (ref 135–145)
Total Bilirubin: 0.7 mg/dL (ref 0.3–1.2)
Total Protein: 7.8 g/dL (ref 6.5–8.1)

## 2016-08-17 LAB — CBC WITH DIFFERENTIAL/PLATELET
Basophils Absolute: 0 10*3/uL (ref 0.0–0.1)
Basophils Relative: 0 %
EOS PCT: 2 %
Eosinophils Absolute: 0.2 10*3/uL (ref 0.0–0.7)
HCT: 47.2 % (ref 39.0–52.0)
Hemoglobin: 15.9 g/dL (ref 13.0–17.0)
LYMPHS ABS: 3.5 10*3/uL (ref 0.7–4.0)
LYMPHS PCT: 35 %
MCH: 33.3 pg (ref 26.0–34.0)
MCHC: 33.7 g/dL (ref 30.0–36.0)
MCV: 99 fL (ref 78.0–100.0)
MONO ABS: 1 10*3/uL (ref 0.1–1.0)
Monocytes Relative: 10 %
Neutro Abs: 5.2 10*3/uL (ref 1.7–7.7)
Neutrophils Relative %: 53 %
PLATELETS: 214 10*3/uL (ref 150–400)
RBC: 4.77 MIL/uL (ref 4.22–5.81)
RDW: 14 % (ref 11.5–15.5)
WBC: 10 10*3/uL (ref 4.0–10.5)

## 2016-08-17 LAB — MAGNESIUM: MAGNESIUM: 1.9 mg/dL (ref 1.7–2.4)

## 2016-08-17 MED ORDER — HEPARIN SOD (PORK) LOCK FLUSH 100 UNIT/ML IV SOLN
500.0000 [IU] | Freq: Once | INTRAVENOUS | Status: AC | PRN
  Administered 2016-08-17: 500 [IU]
  Filled 2016-08-17: qty 5

## 2016-08-17 MED ORDER — PALONOSETRON HCL INJECTION 0.25 MG/5ML
0.2500 mg | Freq: Once | INTRAVENOUS | Status: AC
  Administered 2016-08-17: 0.25 mg via INTRAVENOUS
  Filled 2016-08-17: qty 5

## 2016-08-17 MED ORDER — SODIUM CHLORIDE 0.9 % IV SOLN
50.0000 mg/m2 | Freq: Once | INTRAVENOUS | Status: AC
  Administered 2016-08-17: 123 mg via INTRAVENOUS
  Filled 2016-08-17: qty 123

## 2016-08-17 MED ORDER — INSULIN ASPART 100 UNIT/ML ~~LOC~~ SOLN
8.0000 [IU] | Freq: Once | SUBCUTANEOUS | Status: AC
  Administered 2016-08-17: 8 [IU] via SUBCUTANEOUS
  Filled 2016-08-17 (×3): qty 0.08

## 2016-08-17 MED ORDER — INFLUENZA VAC SPLIT QUAD 0.5 ML IM SUSY
0.5000 mL | PREFILLED_SYRINGE | Freq: Once | INTRAMUSCULAR | Status: AC
  Administered 2016-08-17: 0.5 mL via INTRAMUSCULAR
  Filled 2016-08-17: qty 0.5

## 2016-08-17 MED ORDER — SODIUM CHLORIDE 0.9 % IV SOLN
50.0000 mg/m2 | Freq: Once | INTRAVENOUS | Status: AC
  Administered 2016-08-17: 120 mg via INTRAVENOUS
  Filled 2016-08-17: qty 6

## 2016-08-17 MED ORDER — SODIUM CHLORIDE 0.9% FLUSH
10.0000 mL | INTRAVENOUS | Status: DC | PRN
  Administered 2016-08-17: 10 mL
  Filled 2016-08-17: qty 10

## 2016-08-17 MED ORDER — SODIUM CHLORIDE 0.9 % IV SOLN
Freq: Once | INTRAVENOUS | Status: AC
  Administered 2016-08-17: 11:00:00 via INTRAVENOUS
  Filled 2016-08-17: qty 5

## 2016-08-17 MED ORDER — POTASSIUM CHLORIDE 2 MEQ/ML IV SOLN
Freq: Once | INTRAVENOUS | Status: AC
  Administered 2016-08-17: 09:00:00 via INTRAVENOUS
  Filled 2016-08-17: qty 10

## 2016-08-17 MED ORDER — SODIUM CHLORIDE 0.9 % IV SOLN
Freq: Once | INTRAVENOUS | Status: AC
  Administered 2016-08-17: 09:00:00 via INTRAVENOUS

## 2016-08-17 NOTE — Patient Instructions (Signed)
[facility] Discharge Instructions for Patients Receiving Chemotherapy   Beginning January 23rd 2017 lab work for the [facility] will be done in the  Main lab at [facility] on 1st floor. If you have a lab appointment with the [facility] please come in thru the  Main Entrance and check in at the main information desk   Today you received the following chemotherapy agents Cisplatin and etoposide Cycle 1 Day 1.  To help prevent nausea and vomiting after your treatment, we encourage you to take your nausea medication as instructed. If you develop nausea and vomiting, or diarrhea that is not controlled by your medication, call the clinic.  The clinic phone number is [phone]. Office hours are Monday-Friday 8:30am-5:00pm.  BELOW ARE SYMPTOMS THAT SHOULD BE REPORTED IMMEDIATELY:  *FEVER GREATER THAN 101.0 F  *CHILLS WITH OR WITHOUT FEVER  NAUSEA AND VOMITING THAT IS NOT CONTROLLED WITH YOUR NAUSEA MEDICATION  *UNUSUAL SHORTNESS OF BREATH  *UNUSUAL BRUISING OR BLEEDING  TENDERNESS IN MOUTH AND THROAT WITH OR WITHOUT PRESENCE OF ULCERS  *URINARY PROBLEMS  *BOWEL PROBLEMS  UNUSUAL RASH Items with * indicate a potential emergency and should be followed up as soon as possible. If you have an emergency after office hours please contact your primary care physician or go to the nearest emergency department.  Please call the clinic during office hours if you have any questions or concerns.   You may also contact the Patient Navigator at [phone] should you have any questions or need assistance in obtaining follow up care.      Resources For Cancer Patients and their Caregivers ? American Cancer Society: Can assist with transportation, wigs, general needs, runs Look Good Feel Better.        812-250-8095 ? Cancer Care: Provides financial assistance, online support groups, medication/co-pay assistance.  1-800-813-HOPE 518-230-3048) ? [facility] Assists [place] cancer patients and their families through emotional , educational and financial support.  (713)720-1484 ? [place] DSS Where to apply for food stamps, Medicaid and utility assistance. 479-843-4960 ? [facility]: Transportation to medical appointments. 304-604-1420 ? Social Security Administration: May apply for disability if have a Stage IV cancer. 980-435-7996 7432674833 ? [employer], Disability and Transit Services: Assists with nutrition, care and transit needs. (865)448-8149

## 2016-08-17 NOTE — Progress Notes (Signed)
Labs reviewed with Dr.Penland. Patient to receive 8 units Novolog while here. Also patient asked about Hepatitis vaccination and pneumonia vaccination. Informed patient per Dr.Penland he can not receive Hepatitis vaccine until after all chemo is finished. He can receive pneumonia vaccination in 1 week which we will schedule.  Tolerated chemo well. Stable and ambulatory on discharge home to self.

## 2016-08-17 NOTE — Telephone Encounter (Signed)
Called pt back to clarify his schedule.  He states that he was still having a hard time understanding his schedule. We went back over his schedule.  He is to go to radiation oncology in [place] first then to come see [place].  He verbalized understanding.

## 2016-08-17 NOTE — Progress Notes (Signed)
[facility] Psychosocial Distress Screening Clinical Social Work  Clinical Social Work was referred by distress screening protocol.  The patient scored a 7 on the Psychosocial Distress Thermometer which indicates severe distress. Clinical Social Worker met with pt during his treatment to assess for distress and other psychosocial needs. CSW introduced self, reviewed role of CSW and resources for support. 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 He reports to have support of his sister, who lives next door and his two brothers as well. He feels family will help out more while he is going through his treatment. Pt reports he has had issues with anxiety and depression for many years. He feels this is going ok for him currently. He follows up with Olen Pel, PA for his depression and anxiety and sees her in a few weeks and is aware to discuss this further as needed. Pt shared his biggest concern is being informed about his treatment schedule and he was confused about this today. CSW educated pt that he would get follow up schedule today. Pt denied other needs and CSW provided him with handouts and information about [facility] resources and support programs. Pt agrees to reach out as needed.   ONCBCN DISTRESS SCREENING 08/17/2016  Screening Type Initial Screening  Distress experienced in past week (1-10) 7  Family Problem type Other (comment)  Emotional problem type Depression;Nervousness/Anxiety;Adjusting to illness;Feeling hopeless  Spiritual/Religous concerns type Relating to God;Loss of Faith;Loss of sense of purpose  Information Concerns Type Lack of info about treatment  Physical Problem type Pain;Sleep/insomnia;Getting around;Bathing/dressing;Loss of appetitie;Talking;Tingling hands/feet;Skin dry/itchy  Physician notified of physical symptoms Yes  Referral to clinical psychology No  Referral to clinical social work Yes  Referral to dietition No  Referral to financial  advocate No  Referral to support programs No  Referral to palliative care No    Clinical Social Worker follow up needed: No.  If yes, follow up plan: Loren Racer, [place] Tuesdays   [phone]

## 2016-08-18 ENCOUNTER — Encounter (HOSPITAL_COMMUNITY): Payer: Self-pay

## 2016-08-18 ENCOUNTER — Encounter (HOSPITAL_BASED_OUTPATIENT_CLINIC_OR_DEPARTMENT_OTHER): Payer: Medicare Other

## 2016-08-18 ENCOUNTER — Ambulatory Visit (HOSPITAL_COMMUNITY): Payer: Medicare Other

## 2016-08-18 VITALS — BP 161/83 | HR 85 | Temp 98.9°F | Resp 18 | Wt 270.4 lb

## 2016-08-18 DIAGNOSIS — Z5111 Encounter for antineoplastic chemotherapy: Secondary | ICD-10-CM | POA: Diagnosis present

## 2016-08-18 DIAGNOSIS — C3431 Malignant neoplasm of lower lobe, right bronchus or lung: Secondary | ICD-10-CM

## 2016-08-18 DIAGNOSIS — C3491 Malignant neoplasm of unspecified part of right bronchus or lung: Secondary | ICD-10-CM

## 2016-08-18 MED ORDER — SODIUM CHLORIDE 0.9% FLUSH
10.0000 mL | INTRAVENOUS | Status: DC | PRN
  Administered 2016-08-18: 10 mL
  Filled 2016-08-18: qty 10

## 2016-08-18 MED ORDER — SODIUM CHLORIDE 0.9 % IV SOLN
10.0000 mg | Freq: Once | INTRAVENOUS | Status: AC
  Administered 2016-08-18: 10 mg via INTRAVENOUS
  Filled 2016-08-18: qty 1

## 2016-08-18 MED ORDER — SODIUM CHLORIDE 0.9 % IV SOLN
Freq: Once | INTRAVENOUS | Status: AC
  Administered 2016-08-18: 13:00:00 via INTRAVENOUS

## 2016-08-18 MED ORDER — HEPARIN SOD (PORK) LOCK FLUSH 100 UNIT/ML IV SOLN
500.0000 [IU] | Freq: Once | INTRAVENOUS | Status: AC | PRN
  Administered 2016-08-18: 500 [IU]
  Filled 2016-08-18: qty 5

## 2016-08-18 MED ORDER — ETOPOSIDE CHEMO INJECTION 1 GM/50ML
50.0000 mg/m2 | Freq: Once | INTRAVENOUS | Status: AC
  Administered 2016-08-18: 120 mg via INTRAVENOUS
  Filled 2016-08-18: qty 6

## 2016-08-18 NOTE — Patient Instructions (Signed)
[facility] Discharge Instructions for Patients Receiving Chemotherapy   Beginning January 23rd 2017 lab work for the Cancer Center will be done in the  Main lab at  on 1st floor. If you have a lab appointment with the Cancer Center please come in thru the  Main Entrance and check in at the main information desk   Today you received the following chemotherapy agents VP-16.  To help prevent nausea and vomiting after your treatment, we encourage you to take your nausea medication If you develop nausea and vomiting, or diarrhea that is not controlled by your medication, call the clinic.  The clinic phone number is [phone]. Office hours are Monday-Friday 8:30am-5:00pm.  BELOW ARE SYMPTOMS THAT SHOULD BE REPORTED IMMEDIATELY:  *FEVER GREATER THAN 101.0 F  *CHILLS WITH OR WITHOUT FEVER  NAUSEA AND VOMITING THAT IS NOT CONTROLLED WITH YOUR NAUSEA MEDICATION  *UNUSUAL SHORTNESS OF BREATH  *UNUSUAL BRUISING OR BLEEDING  TENDERNESS IN MOUTH AND THROAT WITH OR WITHOUT PRESENCE OF ULCERS  *URINARY PROBLEMS  *BOWEL PROBLEMS  UNUSUAL RASH Items with * indicate a potential emergency and should be followed up as soon as possible. If you have an emergency after office hours please contact your primary care physician or go to the nearest emergency department.  Please call the clinic during office hours if you have any questions or concerns.   You may also contact the Patient Navigator at [phone] should you have any questions or need assistance in obtaining follow up care.      Resources For Cancer Patients and their Caregivers ? American Cancer Society: Can assist with transportation, wigs, general needs, runs Look Good Feel Better.        1-888-227-6333 ? Cancer Care: Provides financial assistance, online support groups, medication/co-pay assistance.  1-800-813-HOPE (4673) ? [facility] Assists [place] cancer  patients and their families through emotional , educational and financial support.  336-427-4357 ? [place] DSS Where to apply for food stamps, Medicaid and utility assistance. 336-342-1394 ? RCATS: Transportation to medical appointments. 336-347-2287 ? Social Security Administration: May apply for disability if have a Stage IV cancer. 336-342-7796 1-800-772-1213 ? [place] Aging, Disability and Transit Services: Assists with nutrition, care and transit needs. 336-349-2343          

## 2016-08-18 NOTE — Progress Notes (Signed)
24 hour follow up. Patient presented today for Day 2 of chemotherapy. Patient states that he had a little nausea, he did report taking his nausea medicine. Patient also reported that he had some heartburn as well but overall did ok.   Chemo given today per orders, patient tolerated well without problems. Vitals stable and discharged ambulatory home.

## 2016-08-19 ENCOUNTER — Encounter (HOSPITAL_BASED_OUTPATIENT_CLINIC_OR_DEPARTMENT_OTHER): Payer: Medicare Other

## 2016-08-19 ENCOUNTER — Encounter (HOSPITAL_COMMUNITY): Payer: Medicare Other | Admitting: Hematology & Oncology

## 2016-08-19 ENCOUNTER — Ambulatory Visit (HOSPITAL_COMMUNITY): Payer: Medicare Other

## 2016-08-19 VITALS — BP 165/78 | HR 80 | Temp 98.4°F | Resp 18

## 2016-08-19 VITALS — BP 142/66 | HR 81 | Temp 98.0°F | Resp 20 | Wt 270.2 lb

## 2016-08-19 DIAGNOSIS — Z5111 Encounter for antineoplastic chemotherapy: Secondary | ICD-10-CM

## 2016-08-19 DIAGNOSIS — C3431 Malignant neoplasm of lower lobe, right bronchus or lung: Secondary | ICD-10-CM

## 2016-08-19 DIAGNOSIS — C3491 Malignant neoplasm of unspecified part of right bronchus or lung: Secondary | ICD-10-CM

## 2016-08-19 MED ORDER — HEPARIN SOD (PORK) LOCK FLUSH 100 UNIT/ML IV SOLN
500.0000 [IU] | Freq: Once | INTRAVENOUS | Status: AC | PRN
  Administered 2016-08-19: 500 [IU]

## 2016-08-19 MED ORDER — HEPARIN SOD (PORK) LOCK FLUSH 100 UNIT/ML IV SOLN
INTRAVENOUS | Status: AC
  Filled 2016-08-19: qty 5

## 2016-08-19 MED ORDER — SODIUM CHLORIDE 0.9 % IV SOLN
Freq: Once | INTRAVENOUS | Status: AC
  Administered 2016-08-19: 10:00:00 via INTRAVENOUS

## 2016-08-19 MED ORDER — ETOPOSIDE CHEMO INJECTION 1 GM/50ML
50.0000 mg/m2 | Freq: Once | INTRAVENOUS | Status: AC
  Administered 2016-08-19: 120 mg via INTRAVENOUS
  Filled 2016-08-19: qty 6

## 2016-08-19 MED ORDER — SODIUM CHLORIDE 0.9% FLUSH
10.0000 mL | INTRAVENOUS | Status: DC | PRN
  Administered 2016-08-19: 10 mL
  Filled 2016-08-19: qty 10

## 2016-08-19 MED ORDER — SODIUM CHLORIDE 0.9 % IV SOLN
10.0000 mg | Freq: Once | INTRAVENOUS | Status: AC
  Administered 2016-08-19: 10 mg via INTRAVENOUS
  Filled 2016-08-19: qty 1

## 2016-08-19 NOTE — Progress Notes (Signed)
Chemotherapy given today per orders. Patient tolerated well, vitals stable, discharged home ambulatory.

## 2016-08-19 NOTE — Patient Instructions (Addendum)
[facility] Discharge Instructions  RECOMMENDATIONS MADE BY THE CONSULTANT AND ANY TEST RESULTS WILL BE SENT TO YOUR REFERRING PHYSICIAN.  You saw Dr. Penland today.  Thank you for choosing [facility] to provide your oncology and hematology care.  To afford each patient quality time with our provider, please arrive at least 15 minutes before your scheduled appointment time.   Beginning January 23rd 2017 lab work for the Cancer Center will be done in the  Main lab at [place] on 1st floor. If you have a lab appointment with the Cancer Center please come in thru the  Main Entrance and check in at the main information desk  You need to re-schedule your appointment should you arrive 10 or more minutes late.  We strive to give you quality time with our providers, and arriving late affects you and other patients whose appointments are after yours.  Also, if you no show three or more times for appointments you may be dismissed from the clinic at the providers discretion.     Again, thank you for choosing [facility].  Our hope is that these requests will decrease the amount of time that you wait before being seen by our physicians.       _____________________________________________________________  Should you have questions after your visit to [facility], please contact our office at [phone] between the hours of 8:30 a.m. and 4:30 p.m.  Voicemails left after 4:30 p.m. will not be returned until the following business day.  For prescription refill requests, have your pharmacy contact our office.         Resources For Cancer Patients and their Caregivers ? American Cancer Society: Can assist with transportation, wigs, general needs, runs Look Good Feel Better.        1-888-227-6333 ? Cancer Care: Provides financial assistance, online support groups, medication/co-pay assistance.   1-800-813-HOPE (4673) ? [facility] Assists [place] cancer patients and their families through emotional , educational and financial support.  336-427-4357 ? [place] DSS Where to apply for food stamps, Medicaid and utility assistance. 336-342-1394 ? RCATS: Transportation to medical appointments. 336-347-2287 ? Social Security Administration: May apply for disability if have a Stage IV cancer. 336-342-7796 1-800-772-1213 ? [place] Aging, Disability and Transit Services: Assists with nutrition, care and transit needs. 336-349-2343  Cancer Center Support Programs: [email or web address]@ > Cancer Support Group  2nd Tuesday of the month 1pm-2pm, Journey Room  > Creative Journey  3rd Tuesday of the month 1130am-1pm, Journey Room  > Look Good Feel Better  1st Wednesday of the month 10am-12 noon, Journey Room (Call American Cancer Society to register 1-800-395-5775)    

## 2016-08-19 NOTE — Patient Instructions (Signed)
[facility] Discharge Instructions for Patients Receiving Chemotherapy   Beginning January 23rd 2017 lab work for the Cancer Center will be done in the  Main lab at [place] on 1st floor. If you have a lab appointment with the Cancer Center please come in thru the  Main Entrance and check in at the main information desk   Today you received the following chemotherapy agents VP-16.  To help prevent nausea and vomiting after your treatment, we encourage you to take your nausea medication If you develop nausea and vomiting, or diarrhea that is not controlled by your medication, call the clinic.  The clinic phone number is [phone]. Office hours are Monday-Friday 8:30am-5:00pm.  BELOW ARE SYMPTOMS THAT SHOULD BE REPORTED IMMEDIATELY:  *FEVER GREATER THAN 101.0 F  *CHILLS WITH OR WITHOUT FEVER  NAUSEA AND VOMITING THAT IS NOT CONTROLLED WITH YOUR NAUSEA MEDICATION  *UNUSUAL SHORTNESS OF BREATH  *UNUSUAL BRUISING OR BLEEDING  TENDERNESS IN MOUTH AND THROAT WITH OR WITHOUT PRESENCE OF ULCERS  *URINARY PROBLEMS  *BOWEL PROBLEMS  UNUSUAL RASH Items with * indicate a potential emergency and should be followed up as soon as possible. If you have an emergency after office hours please contact your primary care physician or go to the nearest emergency department.  Please call the clinic during office hours if you have any questions or concerns.   You may also contact the Patient Navigator at [phone] should you have any questions or need assistance in obtaining follow up care.      Resources For Cancer Patients and their Caregivers ? American Cancer Society: Can assist with transportation, wigs, general needs, runs Look Good Feel Better.        1-888-227-6333 ? Cancer Care: Provides financial assistance, online support groups, medication/co-pay assistance.  1-800-813-HOPE (4673) ? [facility] Assists [place] cancer  patients and their families through emotional , educational and financial support.  336-427-4357 ? [place] DSS Where to apply for food stamps, Medicaid and utility assistance. 336-342-1394 ? RCATS: Transportation to medical appointments. 336-347-2287 ? Social Security Administration: May apply for disability if have a Stage IV cancer. 336-342-7796 1-800-772-1213 ? [place] Aging, Disability and Transit Services: Assists with nutrition, care and transit needs. 336-349-2343          

## 2016-08-20 ENCOUNTER — Encounter (HOSPITAL_COMMUNITY): Payer: Self-pay

## 2016-08-20 ENCOUNTER — Ambulatory Visit (HOSPITAL_COMMUNITY): Payer: Medicare Other

## 2016-08-20 ENCOUNTER — Encounter (HOSPITAL_BASED_OUTPATIENT_CLINIC_OR_DEPARTMENT_OTHER): Payer: Medicare Other

## 2016-08-20 VITALS — BP 142/84 | HR 69 | Temp 98.4°F | Resp 18 | Wt 267.2 lb

## 2016-08-20 DIAGNOSIS — Z5111 Encounter for antineoplastic chemotherapy: Secondary | ICD-10-CM

## 2016-08-20 DIAGNOSIS — C3431 Malignant neoplasm of lower lobe, right bronchus or lung: Secondary | ICD-10-CM | POA: Diagnosis not present

## 2016-08-20 DIAGNOSIS — C3491 Malignant neoplasm of unspecified part of right bronchus or lung: Secondary | ICD-10-CM

## 2016-08-20 MED ORDER — SODIUM CHLORIDE 0.9 % IV SOLN
10.0000 mg | Freq: Once | INTRAVENOUS | Status: AC
  Administered 2016-08-20: 10 mg via INTRAVENOUS
  Filled 2016-08-20: qty 1

## 2016-08-20 MED ORDER — SODIUM CHLORIDE 0.9 % IV SOLN
50.0000 mg/m2 | Freq: Once | INTRAVENOUS | Status: AC
  Administered 2016-08-20: 120 mg via INTRAVENOUS
  Filled 2016-08-20: qty 6

## 2016-08-20 MED ORDER — HEPARIN SOD (PORK) LOCK FLUSH 100 UNIT/ML IV SOLN
500.0000 [IU] | Freq: Once | INTRAVENOUS | Status: AC | PRN
  Administered 2016-08-20: 500 [IU]
  Filled 2016-08-20: qty 5

## 2016-08-20 MED ORDER — SODIUM CHLORIDE 0.9% FLUSH
10.0000 mL | INTRAVENOUS | Status: DC | PRN
  Administered 2016-08-20: 10 mL
  Filled 2016-08-20: qty 10

## 2016-08-20 MED ORDER — SODIUM CHLORIDE 0.9 % IV SOLN
Freq: Once | INTRAVENOUS | Status: AC
  Administered 2016-08-20: 10:00:00 via INTRAVENOUS

## 2016-08-20 NOTE — Progress Notes (Signed)
Tolerated chemo well. Stable and ambulatory on discharge home to self. 

## 2016-08-20 NOTE — Patient Instructions (Signed)
[facility] Discharge Instructions for Patients Receiving Chemotherapy   Beginning January 23rd 2017 lab work for the [facility] will be done in the  Main lab at [facility] on 1st floor. If you have a lab appointment with the [facility] please come in thru the  Main Entrance and check in at the main information desk   Today you received the following chemotherapy agents etoposide Day 4.  To help prevent nausea and vomiting after your treatment, we encourage you to take your nausea medication as instructed.  If you develop nausea and vomiting, or diarrhea that is not controlled by your medication, call the clinic.  The clinic phone number is [phone]. Office hours are Monday-Friday 8:30am-5:00pm.  BELOW ARE SYMPTOMS THAT SHOULD BE REPORTED IMMEDIATELY:  *FEVER GREATER THAN 101.0 F  *CHILLS WITH OR WITHOUT FEVER  NAUSEA AND VOMITING THAT IS NOT CONTROLLED WITH YOUR NAUSEA MEDICATION  *UNUSUAL SHORTNESS OF BREATH  *UNUSUAL BRUISING OR BLEEDING  TENDERNESS IN MOUTH AND THROAT WITH OR WITHOUT PRESENCE OF ULCERS  *URINARY PROBLEMS  *BOWEL PROBLEMS  UNUSUAL RASH Items with * indicate a potential emergency and should be followed up as soon as possible. If you have an emergency after office hours please contact your primary care physician or go to the nearest emergency department.  Please call the clinic during office hours if you have any questions or concerns.   You may also contact the Patient Navigator at [phone] should you have any questions or need assistance in obtaining follow up care.      Resources For Cancer Patients and their Caregivers ? American Cancer Society: Can assist with transportation, wigs, general needs, runs Look Good Feel Better.        (605)206-6872 ? Cancer Care: Provides financial assistance, online support groups, medication/co-pay assistance.  1-800-813-HOPE 647-526-6825) ? [facility] Assists [place] cancer patients and their families through emotional , educational and financial support.  (608)486-2224 ? [place] DSS Where to apply for food stamps, Medicaid and utility assistance. 918-578-2994 ? RCATS: Transportation to medical appointments. 317-292-4840 ? Social Security Administration: May apply for disability if have a Stage IV cancer. [phone] 2501729153 ? [employer], Disability and Transit Services: Assists with nutrition, care and transit needs. 302-813-6825

## 2016-08-23 ENCOUNTER — Encounter (HOSPITAL_COMMUNITY): Payer: Self-pay

## 2016-08-23 ENCOUNTER — Ambulatory Visit (HOSPITAL_COMMUNITY): Payer: Medicare Other

## 2016-08-23 ENCOUNTER — Encounter (HOSPITAL_BASED_OUTPATIENT_CLINIC_OR_DEPARTMENT_OTHER): Payer: Medicare Other

## 2016-08-23 VITALS — BP 152/85 | HR 95 | Temp 99.1°F | Resp 18 | Wt 254.8 lb

## 2016-08-23 DIAGNOSIS — Z5111 Encounter for antineoplastic chemotherapy: Secondary | ICD-10-CM | POA: Diagnosis present

## 2016-08-23 DIAGNOSIS — Z23 Encounter for immunization: Secondary | ICD-10-CM

## 2016-08-23 DIAGNOSIS — D751 Secondary polycythemia: Secondary | ICD-10-CM | POA: Diagnosis not present

## 2016-08-23 DIAGNOSIS — C3431 Malignant neoplasm of lower lobe, right bronchus or lung: Secondary | ICD-10-CM | POA: Diagnosis not present

## 2016-08-23 DIAGNOSIS — C3491 Malignant neoplasm of unspecified part of right bronchus or lung: Secondary | ICD-10-CM

## 2016-08-23 LAB — COMPREHENSIVE METABOLIC PANEL
ALBUMIN: 4 g/dL (ref 3.5–5.0)
ALT: 60 U/L (ref 17–63)
AST: 41 U/L (ref 15–41)
Alkaline Phosphatase: 103 U/L (ref 38–126)
Anion gap: 8 (ref 5–15)
BUN: 16 mg/dL (ref 6–20)
CHLORIDE: 97 mmol/L — AB (ref 101–111)
CO2: 29 mmol/L (ref 22–32)
CREATININE: 0.62 mg/dL (ref 0.61–1.24)
Calcium: 9.2 mg/dL (ref 8.9–10.3)
GFR calc Af Amer: >60 mL/min (ref >60)
GLUCOSE: 148 mg/dL — AB (ref 65–99)
POTASSIUM: 4 mmol/L (ref 3.5–5.1)
SODIUM: 134 mmol/L — AB (ref 135–145)
Total Bilirubin: 1.3 mg/dL — ABNORMAL HIGH (ref 0.3–1.2)
Total Protein: 7.8 g/dL (ref 6.5–8.1)

## 2016-08-23 LAB — CBC WITH DIFFERENTIAL/PLATELET
BASOS ABS: 0 10*3/uL (ref 0.0–0.1)
BASOS PCT: 0 %
EOS PCT: 1 %
Eosinophils Absolute: 0.1 10*3/uL (ref 0.0–0.7)
HCT: 47.9 % (ref 39.0–52.0)
Hemoglobin: 16.4 g/dL (ref 13.0–17.0)
LYMPHS PCT: 18 %
Lymphs Abs: 1.5 10*3/uL (ref 0.7–4.0)
MCH: 33.8 pg (ref 26.0–34.0)
MCHC: 34.2 g/dL (ref 30.0–36.0)
MCV: 98.8 fL (ref 78.0–100.0)
MONO ABS: 0.1 10*3/uL (ref 0.1–1.0)
Monocytes Relative: 1 %
Neutro Abs: 6.6 10*3/uL (ref 1.7–7.7)
Neutrophils Relative %: 80 %
PLATELETS: 188 10*3/uL (ref 150–400)
RBC: 4.85 MIL/uL (ref 4.22–5.81)
RDW: 13.4 % (ref 11.5–15.5)
WBC: 8.3 10*3/uL (ref 4.0–10.5)

## 2016-08-23 LAB — MAGNESIUM: Magnesium: 2 mg/dL (ref 1.7–2.4)

## 2016-08-23 MED ORDER — POTASSIUM CHLORIDE 2 MEQ/ML IV SOLN
Freq: Once | INTRAVENOUS | Status: AC
  Administered 2016-08-23: 10:00:00 via INTRAVENOUS
  Filled 2016-08-23: qty 10

## 2016-08-23 MED ORDER — SODIUM CHLORIDE 0.9 % IV SOLN
Freq: Once | INTRAVENOUS | Status: AC
  Administered 2016-08-23: 13:00:00 via INTRAVENOUS

## 2016-08-23 MED ORDER — HEPARIN SOD (PORK) LOCK FLUSH 100 UNIT/ML IV SOLN
500.0000 [IU] | Freq: Once | INTRAVENOUS | Status: AC | PRN
  Administered 2016-08-23: 500 [IU]
  Filled 2016-08-23: qty 5

## 2016-08-23 MED ORDER — SODIUM CHLORIDE 0.9 % IV SOLN
50.0000 mg/m2 | Freq: Once | INTRAVENOUS | Status: AC
  Administered 2016-08-23: 123 mg via INTRAVENOUS
  Filled 2016-08-23: qty 123

## 2016-08-23 MED ORDER — PNEUMOCOCCAL VAC POLYVALENT 25 MCG/0.5ML IJ INJ
0.5000 mL | INJECTION | Freq: Once | INTRAMUSCULAR | Status: AC
  Administered 2016-08-23: 0.5 mL via SUBCUTANEOUS
  Filled 2016-08-23: qty 0.5

## 2016-08-23 MED ORDER — SODIUM CHLORIDE 0.9% FLUSH
10.0000 mL | INTRAVENOUS | Status: DC | PRN
  Administered 2016-08-23: 10 mL
  Filled 2016-08-23: qty 10

## 2016-08-23 MED ORDER — PALONOSETRON HCL INJECTION 0.25 MG/5ML
0.2500 mg | Freq: Once | INTRAVENOUS | Status: AC
  Administered 2016-08-23: 0.25 mg via INTRAVENOUS
  Filled 2016-08-23: qty 5

## 2016-08-23 MED ORDER — SODIUM CHLORIDE 0.9 % IV SOLN
Freq: Once | INTRAVENOUS | Status: AC
  Administered 2016-08-23: 13:00:00 via INTRAVENOUS
  Filled 2016-08-23: qty 5

## 2016-08-23 NOTE — Patient Instructions (Signed)
[facility] Discharge Instructions for Patients Receiving Chemotherapy   Beginning January 23rd 2017 lab work for the [facility] will be done in the  Main lab at [facility] on 1st floor. If you have a lab appointment with the [facility] please come in thru the  Main Entrance and check in at the main information desk   Today you received the following chemotherapy agents Cisplatin  To help prevent nausea and vomiting after your treatment, we encourage you to take your nausea medication     If you develop nausea and vomiting, or diarrhea that is not controlled by your medication, call the clinic.  The clinic phone number is [phone]. Office hours are Monday-Friday 8:30am-5:00pm.  BELOW ARE SYMPTOMS THAT SHOULD BE REPORTED IMMEDIATELY:  *FEVER GREATER THAN 101.0 F  *CHILLS WITH OR WITHOUT FEVER  NAUSEA AND VOMITING THAT IS NOT CONTROLLED WITH YOUR NAUSEA MEDICATION  *UNUSUAL SHORTNESS OF BREATH  *UNUSUAL BRUISING OR BLEEDING  TENDERNESS IN MOUTH AND THROAT WITH OR WITHOUT PRESENCE OF ULCERS  *URINARY PROBLEMS  *BOWEL PROBLEMS  UNUSUAL RASH Items with * indicate a potential emergency and should be followed up as soon as possible. If you have an emergency after office hours please contact your primary care physician or go to the nearest emergency department.  Please call the clinic during office hours if you have any questions or concerns.   You may also contact the Patient Navigator at [phone] should you have any questions or need assistance in obtaining follow up care.      Resources For Cancer Patients and their Caregivers ? American Cancer Society: Can assist with transportation, wigs, general needs, runs Look Good Feel Better.        251-521-4175 ? Cancer Care: Provides financial assistance, online support groups, medication/co-pay assistance.  1-800-813-HOPE 364-275-4124) ? [facility] Assists [place]  cancer patients and their families through emotional , educational and financial support.  504-351-9613 ? [place] DSS Where to apply for food stamps, Medicaid and utility assistance. 867-496-4327 ? RCATS: Transportation to medical appointments. [phone] ? Social Security Administration: May apply for disability if have a Stage IV cancer. 9792172464 480-102-2232 ? [employer], Disability and Transit Services: Assists with nutrition, care and transit needs. [phone]

## 2016-08-23 NOTE — Progress Notes (Signed)
Urine output 439m. Labs reviewed with MD, proceed with treatment.   Erik Gowdapresents today for injection per MD orders.  Pneumococcal vaccine 23 administered IM in left Upper Arm. Administration without incident. Patient tolerated well.   Chemotherapy given today per orders. Patient tolerated it well. VSS, discharged home, self, ambulatory.

## 2016-08-25 ENCOUNTER — Encounter (HOSPITAL_COMMUNITY): Payer: Self-pay | Admitting: Emergency Medicine

## 2016-08-25 NOTE — Progress Notes (Signed)
Chemotherapy schedule faxed to [facility]

## 2016-08-30 ENCOUNTER — Encounter (HOSPITAL_BASED_OUTPATIENT_CLINIC_OR_DEPARTMENT_OTHER): Payer: Medicare Other | Admitting: Hematology & Oncology

## 2016-08-30 ENCOUNTER — Ambulatory Visit (HOSPITAL_COMMUNITY): Payer: Medicare Other | Admitting: Hematology & Oncology

## 2016-08-30 ENCOUNTER — Encounter (HOSPITAL_COMMUNITY): Payer: Self-pay | Admitting: Hematology & Oncology

## 2016-08-30 VITALS — BP 101/77 | HR 102 | Temp 98.3°F | Resp 16 | Ht 72.0 in | Wt 255.0 lb

## 2016-08-30 DIAGNOSIS — K210 Gastro-esophageal reflux disease with esophagitis: Secondary | ICD-10-CM | POA: Diagnosis not present

## 2016-08-30 DIAGNOSIS — K219 Gastro-esophageal reflux disease without esophagitis: Secondary | ICD-10-CM

## 2016-08-30 DIAGNOSIS — C342 Malignant neoplasm of middle lobe, bronchus or lung: Secondary | ICD-10-CM

## 2016-08-30 DIAGNOSIS — K208 Other esophagitis without bleeding: Secondary | ICD-10-CM

## 2016-08-30 DIAGNOSIS — C3491 Malignant neoplasm of unspecified part of right bronchus or lung: Secondary | ICD-10-CM

## 2016-08-30 DIAGNOSIS — Z720 Tobacco use: Secondary | ICD-10-CM | POA: Diagnosis not present

## 2016-08-30 MED ORDER — MAGIC MOUTHWASH W/LIDOCAINE: ORAL | 3 refills | Status: DC

## 2016-08-30 MED ORDER — OMEPRAZOLE 40 MG PO CPDR: 40.0000 mg | DELAYED_RELEASE_CAPSULE | Freq: Every day | ORAL | 3 refills | Status: DC

## 2016-08-30 NOTE — Progress Notes (Signed)
Erik Hampshire, MD [address] [place] [address]  No diagnosis found.  CURRENT THERAPY: Development of treatment plan.  INTERVAL HISTORY: Erik [name] 55 y.o. 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    Adenocarcinoma of right lung ([place])   07/01/2016 PET scan    6.3 x 3.7 cm right lower lobe mass, suspicious for primary bronchogenic neoplasm, as described above. Hypermetabolic thoracic nodal metastases, as above. Additional right perihilar hypermetabolism, indeterminate. Associated right middle lobe atelectasis/collapse.      07/19/2016 Procedure    Bronchoscopy with brushings and biopsies and endobronchial ultrasound with mediastinal lymph node aspirations by Dr. Roxan [name]      07/21/2016 Pathology Results    Lung, biopsy, Right Middle Lobe - LUNG TISSUE WITH SQUAMOUS METAPLASIA. - NO MALIGNANCY IDENTIFIED.      07/21/2016 Pathology Results    FINE NEEDLE ASPIRATION, ENDOSCOPIC (A) LEVEL 7 (SPECIMEN 1 OF 3, COLLECTED ON 07/19/16): MALIGNANT CELLS CONSISTENT WITH ADENOCARCINOMA.      07/21/2016 Pathology Results    FINE NEEDLE ASPIRATION, EBUS, 4R, B (SPECIMEN 2 OF 3, COLLECTED 07/19/16): MALIGNANT CELLS CONSISTENT WITH ADENOCARCINOMA.      07/21/2016 Pathology Results    FINE NEEDLE ASPIRATION, EBUS, BRUSHING, RIGHT MIDDLE LOBE, D (SPECIMEN 3 OF 3, COLLECTED 07/19/16): MALIGNANT CELLS CONSISTENT WITH ADENOCARCINOMA.      07/22/2016 Imaging    MRI brain- No evidence of intracranial metastases.      Erik [name] is unaccompanied.  He is doing pretty well.  He reports sore throat and some difficulty swallowing. "It more of less feels like a cold coming on". He is able to swallow food and drinks. He is still eating and sometimes has to make himself eat. He takes a daily pain medication. He has drank Boost/Ensure but admits it is expensive, "I would drink it all the time if I had it".   He reports heartburn, this has  been worsening since starting chemotherapy. He is not on a PPI.  He reports memory issues and asks whether this is chemotherapy related. He has been having issues with comprehension and forgets scheduling.  He reports nausea a whole lot. His anti-nausea medication manages this. He reports one or two episodes of vomiting which he does not associate with chemotherapy but rather to nasal drainage at night.   He has been sleeping pretty well. He takes one  xanax at night.   He did not meet with Erik [name] from nutrition.   He is happy with the care he has received at our clinic. "As far as things are going, I couldn't be happier".    Review of Systems  Constitutional: Negative.  Negative for chills, fever and weight loss.  HENT: Positive for sore throat.        Difficulty swallowing  Eyes: Negative for blurred vision and double vision.  Respiratory: Negative for hemoptysis and sputum production.   Cardiovascular: Negative.  Negative for chest pain.  Gastrointestinal: Positive for heartburn, nausea and vomiting.       Nausea managed with medication. One to two episodes of vomiting associated with drainage.  Genitourinary: Negative.  Negative for dysuria.  Musculoskeletal: Negative.   Skin: Negative.   Neurological: Negative.  Negative for weakness and headaches.  Endo/Heme/Allergies: Negative.   Psychiatric/Behavioral: Positive for memory loss.       Memory issues associated with chemotherapy.  14 point review of systems was performed and is negative except as detailed  under history of present illness and above   Past Medical History:  Diagnosis Date  . Arnold-Chiari syndrome ([place])   . Arthritis   . Chronic back pain   . Depression   . GERD (gastroesophageal reflux disease)   . Seasonal allergies   . Spinal stenosis of lumbar region   . 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 Wheezing     Past Surgical History:  Procedure Laterality Date  . BACK SURGERY    .  MULTIPLE EXTRACTIONS WITH ALVEOLOPLASTY N/A 07/08/2014   Procedure: MULTIPLE EXTRACION WITH ALVEOLOPLASTY with EXCISION LESION RIGHT SIDE OF TONGUE;  Surgeon: Gae Bon, DDS;  Location: [facility];  Service: Oral Surgery;  Laterality: N/A;  . PORTACATH PLACEMENT Right 07/30/2016   Procedure: INSERTION PORT-A-CATH;  Surgeon: Vickie Epley, MD;  Location: [facility];  Service: Vascular;  Laterality: Right;  [name] VIDEO BRONCHOSCOPY WITH ENDOBRONCHIAL ULTRASOUND N/A 07/19/2016   Procedure: VIDEO BRONCHOSCOPY WITH ENDOBRONCHIAL ULTRASOUND;  Surgeon: [name], MD;  Location[facility];  Service: Thoracic;  Laterality: N/A;    Family History  Problem Relation Age of Onset  . Cancer Mother     Social History   Social History  . Marital status: Divorced    Spouse name: N/A  . Number of children: N/A  . Years of education: N/A   Social History Main Topics  . Smoking status: Current Every Day Smoker    Packs/day: 2.00    Years: 38.00    Types: Cigarettes  . Smokeless tobacco: Former Systems developer    Types: Snuff     Comment: using chantix- smoking once in a while  . Alcohol use 1.8 oz/week    3 Cans of beer per week     Comment: reports drinking 2-3 beers a week  . Drug use:     Types: Hydrocodone  . Sexual activity: Not Currently   Other Topics Concern  . None   Social History Narrative  . None     PHYSICAL EXAMINATION  ECOG PERFORMANCE STATUS: 1 - Symptomatic but completely ambulatory  Vitals:   08/30/16 1209  BP: 101/77  Pulse: (!) 102  Resp: 16  Temp: 98.3 F (36.8 C)    GENERAL:alert, no distress, well nourished, well developed, comfortable, cooperative, obese, smiling, unaccompanied SKIN: skin color, texture, turgor are normal, no rashes or significant lesions HEAD: Normocephalic, No masses, lesions, tenderness or abnormalities EYES: normal, EOMI, Conjunctiva are pink and non-injected EARS: External ears normal OROPHARYNX:lips, buccal mucosa, and tongue normal and  mucous membranes are moist  NECK: supple, no adenopathy, thyroid normal size, non-tender, without nodularity, trachea midline LYMPH:  no palpable lymphadenopathy, no hepatosplenomegaly BREAST:not examined LUNGS: positive findings: rhonchi  B/L, wheezing  Expiratory B/L HEART: regular rate & rhythm, no murmurs and no gallops ABDOMEN:abdomen soft, non-tender, obese, normal bowel sounds and no masses or organomegaly BACK: Back symmetric, no curvature. EXTREMITIES:less then 2 second capillary refill, no joint deformities, effusion, or inflammation, no skin discoloration, no cyanosis  NEURO: alert & oriented x 3 with fluent speech, no focal motor/sensory deficits, gait normal   LABORATORY DATA: I have reviewed the data as listed. CBC    Component Value Date/Time   WBC 8.3 08/23/2016 1012   RBC 4.85 08/23/2016 1012   HGB 16.4 08/23/2016 1012   HCT 47.9 08/23/2016 1012   PLT 188 08/23/2016 1012   MCV 98.8 08/23/2016 1012   MCH 33.8 08/23/2016 1012   MCHC 34.2 08/23/2016 1012  RDW 13.4 08/23/2016 1012   LYMPHSABS 1.5 08/23/2016 1012   MONOABS 0.1 08/23/2016 1012   EOSABS 0.1 08/23/2016 1012   BASOSABS 0.0 08/23/2016 1012      Chemistry      Component Value Date/Time   NA 134 (L) 08/23/2016 1012   K 4.0 08/23/2016 1012   CL 97 (L) 08/23/2016 1012   CO2 29 08/23/2016 1012   BUN 16 08/23/2016 1012   CREATININE 0.62 08/23/2016 1012      Component Value Date/Time   CALCIUM 9.2 08/23/2016 1012   ALKPHOS 103 08/23/2016 1012   AST 41 08/23/2016 1012   ALT 60 08/23/2016 1012   BILITOT 1.3 (H) 08/23/2016 1012      RADIOGRAPHIC STUDIES: I have personally reviewed the radiological images as listed and agreed with the findings in the report. CLINICAL DATA:  Initial treatment strategy for right lower lung mass. EXAM: NUCLEAR MEDICINE PET SKULL BASE TO THIGH TECHNIQUE: 13.0 mCi F-18 FDG was injected intravenously. Full-ring PET imaging was performed from the skull base to thigh  after the radiotracer. CT data was obtained and used for attenuation correction and anatomic localization. FASTING BLOOD GLUCOSE:  Value: 128 mg/dl COMPARISON:  CT abdomen dated 06/17/2016. FINDINGS: NECK No hypermetabolic lymph nodes in the neck. CHEST 6.3 x 3.7 cm right lower lobe mass, max SUV 4.1, suspicious for primary bronchogenic neoplasm. Lesion demonstrates 8 peripheral ground-glass opacity with central solid component measuring 3.7 cm. Associated extension to the pleural surface. Thoracic lymphadenopathy, including: --1.7 cm short axis prevascular node (series 2/ image 69), max SUV 10.0 --1.7 cm short axis right paratracheal node (series 4/ image 73), max SUV 9.8 --1.9 cm short axis AP window node (series 4/image 34), max SUV 7.3 --2.2 cm short axis subcarinal node (series 4/ image 81), max SUV 7.6 Mild right perihilar hypermetabolism, max SUV 6.0. Associated right middle lobe atelectasis/collapse. ABDOMEN/PELVIS No abnormal hypermetabolic activity within the liver, pancreas, adrenal glands, or spleen. Hepatic steatosis. Small upper abdominal lymph nodes measuring up to 1.8 cm short axis (series 4/image 131), without hypermetabolism. No hypermetabolic lymph nodes in the abdomen or pelvis. SKELETON Postsurgical changes involving the lumbar spine. No focal hypermetabolic activity to suggest skeletal metastasis. IMPRESSION: 6.3 x 3.7 cm right lower lobe mass, suspicious for primary bronchogenic neoplasm, as described above. Hypermetabolic thoracic nodal metastases, as above. Additional right perihilar hypermetabolism, indeterminate. Associated right middle lobe atelectasis/collapse. Electronically Signed   By: Julian Hy M.D.   On: 07/01/2016 12:18   PATHOLOGY: As above in oncology history.    ASSESSMENT AND PLAN:  Stage IIIA (T3N2M0) adenocarcinoma of right lung Tobacco Use GERD Radiation induced esophagitis, mild   He has done well with cycle #1 of  therapy. For his GERD I have prescribed prilosec. He was instructed to take this daily. For his esophagitis, he was prescribed magic mouthwash.  I have advised him that as he progresses through therapy if his pain worsens, to let me know. At some point he may require pain medication.   Both medications were sent to [facility].   He will contact our clinic if his heartburn persists or if any other symptoms worsen.  I will refer Mr. Dimaano to Erik [name].   He is scheduled to follow up on 09/13/16 with treatment.  Continue with concomitant chemoXRT consisting of Cisplatin Day 1, 8, 29, 33 and Etoposide days 1-5 and 29-33 with curative intent.  All questions were answered. The patient knows to call the clinic with any problems, questions  or concerns. We can certainly see the patient much sooner if necessary.  This document serves as a record of services personally performed by Ancil Linsey, MD. It was created on her behalf by Arlyce Harman, a trained medical scribe. The creation of this record is based on the scribe's personal observations and the provider's statements to them. This document has been checked and approved by the attending provider.  I have reviewed the above documentation for accuracy and completeness and I agree with the above.  This note is electronically signed [email or web address],[email or web address] Cyril Mourning, MD  08/30/2016 12:14 PM

## 2016-08-30 NOTE — Patient Instructions (Signed)
[place] at [facility] Discharge Instructions  RECOMMENDATIONS MADE BY THE CONSULTANT AND ANY TEST RESULTS WILL BE SENT TO YOUR REFERRING PHYSICIAN.  You saw Dr. Whitney Muse today. Prilosec and magic mouthwash will be called in to your pharmacy later today. Samples of ensure/boost.  We will contact dietician about getting you a case.  Thank you for choosing [place] at [facility] to provide your oncology and hematology care.  To afford each patient quality time with our provider, please arrive at least 15 minutes before your scheduled appointment time.   Beginning January 23rd 2017 lab work for the [facility] will be done in the  Main lab at [facility] on 1st floor. If you have a lab appointment with the [facility] please come in thru the  Main Entrance and check in at the main information desk  You need to re-schedule your appointment should you arrive 10 or more minutes late.  We strive to give you quality time with our providers, and arriving late affects you and other patients whose appointments are after yours.  Also, if you no show three or more times for appointments you may be dismissed from the clinic at the providers discretion.     Again, thank you for choosing [facility].  Our hope is that these requests will decrease the amount of time that you wait before being seen by our physicians.       _____________________________________________________________  Should you have questions after your visit to [facility], please contact our office at [phone] between the hours of 8:30 a.m. and 4:30 p.m.  Voicemails left after 4:30 p.m. will not be returned until the following business day.  For prescription refill requests, have your pharmacy contact our office.         Resources For Cancer Patients and their Caregivers ? American Cancer Society: Can assist with transportation, wigs, general needs,  runs Look Good Feel Better.        954-727-4530 ? Cancer Care: Provides financial assistance, online support groups, medication/co-pay assistance.  1-800-813-HOPE 903-277-7046) ? [place] Assists [place] cancer patients and their families through emotional , educational and financial support.  215-096-3589 ? [place] DSS Where to apply for food stamps, Medicaid and utility assistance. [phone] ? RCATS: Transportation to medical appointments. (934) 413-1724 ? Social Security Administration: May apply for disability if have a Stage IV cancer. 509-405-4001 [phone] ? [employer], Disability and Transit Services: Assists with nutrition, care and transit needs. [place] Support Programs: '@10RELATIVEDAYS'$ @ > Cancer Support Group  2nd Tuesday of the month 1pm-2pm, Journey Room  > Creative Journey  3rd Tuesday of the month 1130am-1pm, Journey Room  > Look Good Feel Better  1st Wednesday of the month 10am-12 noon, Journey Room (Call [place] to register [phone])

## 2016-08-31 ENCOUNTER — Encounter (HOSPITAL_COMMUNITY): Payer: Self-pay | Admitting: Hematology & Oncology

## 2016-09-01 ENCOUNTER — Encounter: Payer: Self-pay | Admitting: Dietician

## 2016-09-01 NOTE — Progress Notes (Signed)
RD notified by MD about pt having radiation to mediastinum and, due to promity ,having some esophageal pain.   Contacted Pt by Phone  Wt Readings from Last 10 Encounters:  08/30/16 255 lb (115.7 kg)  08/23/16 254 lb 12.8 oz (115.6 kg)  08/20/16 267 lb 3.2 oz (121.2 kg)  08/19/16 270 lb 3.2 oz (122.6 kg)  08/18/16 270 lb 6.4 oz (122.7 kg)  08/17/16 260 lb (117.9 kg)  07/23/16 262 lb 14.4 oz (119.3 kg)  07/19/16 269 lb (122 kg)  07/15/16 269 lb (122 kg)  07/09/16 269 lb 9.6 oz (122.3 kg)   Patient weight has decreased by 15 lbs in the last 1-2 months.   Patient reports oral intake as poor and is suffering from symptoms including Esophageal pain and poor appetite.   He states the the esophageal pain is manageable and this has not really caused him to alter his food choice. He said he just bears it.   His appetite is the main problem. He is only eating 1 meal a day. He is told that it is crucial that he try to eat more frequently than this, even if he doesn't have an appetite. He should just eat small bites or sip on calorically dense beverages throughout the day. He said he will try  He has been using Boost supplements. He notes they are very expensive. He is educated on the ensure assistance program. A case of chocolate will be ordered; he lives 2 miles away and stated he would come receive it.   RD stated he will try to meet with pt when he comes in for treatment in about 2 weeks. Education material and coupons to be sent for time being.   Pt sounded to be feeling well. He was laughing and sounded to be in a good mood.   Mailed my contact info, coupons, and handouts titled "Poor Appetite" and "Sore or Irritated throat"  Burtis Junes RD, LDN, [facility] Pager: [phone] 09/01/2016 11:33 AM

## 2016-09-13 ENCOUNTER — Ambulatory Visit (HOSPITAL_COMMUNITY): Payer: Medicare Other | Admitting: Hematology & Oncology

## 2016-09-13 ENCOUNTER — Encounter (HOSPITAL_BASED_OUTPATIENT_CLINIC_OR_DEPARTMENT_OTHER): Payer: Medicare Other | Admitting: Hematology & Oncology

## 2016-09-13 ENCOUNTER — Encounter (HOSPITAL_COMMUNITY): Payer: Medicare Other | Attending: Hematology

## 2016-09-13 VITALS — BP 146/82 | HR 82 | Temp 98.3°F | Resp 18 | Wt 258.8 lb

## 2016-09-13 DIAGNOSIS — F17210 Nicotine dependence, cigarettes, uncomplicated: Secondary | ICD-10-CM | POA: Diagnosis not present

## 2016-09-13 DIAGNOSIS — K210 Gastro-esophageal reflux disease with esophagitis: Secondary | ICD-10-CM

## 2016-09-13 DIAGNOSIS — C3491 Malignant neoplasm of unspecified part of right bronchus or lung: Secondary | ICD-10-CM

## 2016-09-13 DIAGNOSIS — J209 Acute bronchitis, unspecified: Secondary | ICD-10-CM | POA: Diagnosis not present

## 2016-09-13 DIAGNOSIS — C342 Malignant neoplasm of middle lobe, bronchus or lung: Secondary | ICD-10-CM

## 2016-09-13 DIAGNOSIS — Z5111 Encounter for antineoplastic chemotherapy: Secondary | ICD-10-CM

## 2016-09-13 DIAGNOSIS — K208 Other esophagitis: Secondary | ICD-10-CM

## 2016-09-13 DIAGNOSIS — D751 Secondary polycythemia: Secondary | ICD-10-CM | POA: Diagnosis not present

## 2016-09-13 DIAGNOSIS — K219 Gastro-esophageal reflux disease without esophagitis: Secondary | ICD-10-CM

## 2016-09-13 DIAGNOSIS — T66XXXA Radiation sickness, unspecified, initial encounter: Secondary | ICD-10-CM

## 2016-09-13 DIAGNOSIS — Z716 Tobacco abuse counseling: Secondary | ICD-10-CM | POA: Diagnosis not present

## 2016-09-13 DIAGNOSIS — R112 Nausea with vomiting, unspecified: Secondary | ICD-10-CM

## 2016-09-13 LAB — COMPREHENSIVE METABOLIC PANEL
ALBUMIN: 3.6 g/dL (ref 3.5–5.0)
ALT: 28 U/L (ref 17–63)
ANION GAP: 5 (ref 5–15)
AST: 33 U/L (ref 15–41)
Alkaline Phosphatase: 82 U/L (ref 38–126)
BUN: 12 mg/dL (ref 6–20)
CHLORIDE: 100 mmol/L — AB (ref 101–111)
CO2: 33 mmol/L — AB (ref 22–32)
Calcium: 8.9 mg/dL (ref 8.9–10.3)
Creatinine, Ser: 0.68 mg/dL (ref 0.61–1.24)
GFR calc Af Amer: >60 mL/min (ref >60)
GFR calc non Af Amer: >60 mL/min (ref >60)
GLUCOSE: 136 mg/dL — AB (ref 65–99)
POTASSIUM: 3.8 mmol/L (ref 3.5–5.1)
SODIUM: 138 mmol/L (ref 135–145)
Total Bilirubin: 0.5 mg/dL (ref 0.3–1.2)
Total Protein: 7.1 g/dL (ref 6.5–8.1)

## 2016-09-13 LAB — CBC WITH DIFFERENTIAL/PLATELET
BASOS ABS: 0 10*3/uL (ref 0.0–0.1)
Basophils Relative: 1 %
EOS ABS: 0 10*3/uL (ref 0.0–0.7)
Eosinophils Relative: 1 %
HCT: 38.7 % — ABNORMAL LOW (ref 39.0–52.0)
Hemoglobin: 12.8 g/dL — ABNORMAL LOW (ref 13.0–17.0)
Lymphocytes Relative: 15 %
Lymphs Abs: 0.6 10*3/uL — ABNORMAL LOW (ref 0.7–4.0)
MCH: 32.8 pg (ref 26.0–34.0)
MCHC: 33.1 g/dL (ref 30.0–36.0)
MCV: 99.2 fL (ref 78.0–100.0)
MONO ABS: 0.5 10*3/uL (ref 0.1–1.0)
MONOS PCT: 14 %
NEUTROS ABS: 2.8 10*3/uL (ref 1.7–7.7)
NEUTROS PCT: 71 %
PLATELETS: 140 10*3/uL — AB (ref 150–400)
RBC: 3.9 MIL/uL — ABNORMAL LOW (ref 4.22–5.81)
RDW: 14.6 % (ref 11.5–15.5)
WBC: 3.9 10*3/uL — ABNORMAL LOW (ref 4.0–10.5)

## 2016-09-13 LAB — MAGNESIUM: Magnesium: 1.7 mg/dL (ref 1.7–2.4)

## 2016-09-13 MED ORDER — AZITHROMYCIN 250 MG PO TABS: ORAL_TABLET | ORAL | 0 refills | Status: DC

## 2016-09-13 MED ORDER — SODIUM CHLORIDE 0.9% FLUSH
10.0000 mL | INTRAVENOUS | Status: DC | PRN
  Administered 2016-09-13: 10 mL
  Filled 2016-09-13: qty 10

## 2016-09-13 MED ORDER — HEPARIN SOD (PORK) LOCK FLUSH 100 UNIT/ML IV SOLN
500.0000 [IU] | Freq: Once | INTRAVENOUS | Status: AC | PRN
  Administered 2016-09-13: 500 [IU]
  Filled 2016-09-13: qty 5

## 2016-09-13 MED ORDER — PALONOSETRON HCL INJECTION 0.25 MG/5ML
0.2500 mg | Freq: Once | INTRAVENOUS | Status: AC
  Administered 2016-09-13: 0.25 mg via INTRAVENOUS
  Filled 2016-09-13: qty 5

## 2016-09-13 MED ORDER — ESOMEPRAZOLE MAGNESIUM 40 MG PO CPDR: 40.0000 mg | DELAYED_RELEASE_CAPSULE | Freq: Two times a day (BID) | ORAL | 1 refills | Status: DC

## 2016-09-13 MED ORDER — FOSAPREPITANT DIMEGLUMINE INJECTION 150 MG
Freq: Once | INTRAVENOUS | Status: AC
  Administered 2016-09-13: 12:00:00 via INTRAVENOUS
  Filled 2016-09-13: qty 5

## 2016-09-13 MED ORDER — ETOPOSIDE CHEMO INJECTION 1 GM/50ML
50.0000 mg/m2 | Freq: Once | INTRAVENOUS | Status: AC
  Administered 2016-09-13: 120 mg via INTRAVENOUS
  Filled 2016-09-13: qty 6

## 2016-09-13 MED ORDER — SODIUM CHLORIDE 0.9 % IV SOLN
50.0000 mg/m2 | Freq: Once | INTRAVENOUS | Status: AC
  Administered 2016-09-13: 123 mg via INTRAVENOUS
  Filled 2016-09-13: qty 123

## 2016-09-13 MED ORDER — POTASSIUM CHLORIDE 2 MEQ/ML IV SOLN
Freq: Once | INTRAVENOUS | Status: AC
  Administered 2016-09-13: 10:00:00 via INTRAVENOUS
  Filled 2016-09-13: qty 10

## 2016-09-13 MED ORDER — SODIUM CHLORIDE 0.9 % IV SOLN
Freq: Once | INTRAVENOUS | Status: AC
  Administered 2016-09-13: 12:00:00 via INTRAVENOUS

## 2016-09-13 NOTE — Patient Instructions (Addendum)
[place] at [facility] Discharge Instructions  RECOMMENDATIONS MADE BY THE CONSULTANT AND ANY TEST RESULTS WILL BE SENT TO YOUR REFERRING PHYSICIAN.  You saw Dr. Whitney Muse today. Nexium and Zithromycin sent to [facility]. Take Zofran every night at bedtime. Follow up in 1 week with labs and chemo also.  Thank you for choosing [place] at [facility] to provide your oncology and hematology care.  To afford each patient quality time with our provider, please arrive at least 15 minutes before your scheduled appointment time.   Beginning January 23rd 2017 lab work for the [facility] will be done in the  Main lab at [facility] on 1st floor. If you have a lab appointment with the [facility] please come in thru the  Main Entrance and check in at the main information desk  You need to re-schedule your appointment should you arrive 10 or more minutes late.  We strive to give you quality time with our providers, and arriving late affects you and other patients whose appointments are after yours.  Also, if you no show three or more times for appointments you may be dismissed from the clinic at the providers discretion.     Again, thank you for choosing [facility].  Our hope is that these requests will decrease the amount of time that you wait before being seen by our physicians.       _____________________________________________________________  Should you have questions after your visit to [facility], please contact our office at [phone] between the hours of 8:30 a.m. and 4:30 p.m.  Voicemails left after 4:30 p.m. will not be returned until the following business day.  For prescription refill requests, have your pharmacy contact our office.         Resources For Cancer Patients and their Caregivers ? American Cancer Society: Can assist with transportation, wigs, general needs, runs Look Good Feel  Better.        989-588-4777 ? Cancer Care: Provides financial assistance, online support groups, medication/co-pay assistance.  1-800-813-HOPE 8168488604) ? [place] Assists [place] cancer patients and their families through emotional , educational and financial support.  510-487-3716 ? [place] DSS Where to apply for food stamps, Medicaid and utility assistance. (603) 387-2402 ? RCATS: Transportation to medical appointments. 262-342-9858 ? Social Security Administration: May apply for disability if have a Stage IV cancer. [phone] ? [employer], Disability and Transit Services: Assists with nutrition, care and transit needs. [place] Support Programs: '@10RELATIVEDAYS'$ @ > Cancer Support Group  2nd Tuesday of the month 1pm-2pm, Journey Room  > Creative Journey  3rd Tuesday of the month 1130am-1pm, Journey Room  > Look Good Feel Better  1st Wednesday of the month 10am-12 noon, Journey Room (Call [place] to register [phone])

## 2016-09-13 NOTE — Patient Instructions (Signed)
[facility] Discharge Instructions for Patients Receiving Chemotherapy   Beginning January 23rd 2017 lab work for the [facility] will be done in the  Main lab at [facility] on 1st floor. If you have a lab appointment with the [facility] please come in thru the  Main Entrance and check in at the main information desk   Today you received the following chemotherapy agents Cisplatin and VP-16. Follow-up as scheduled. Call clinic for any questions or concerns  To help prevent nausea and vomiting after your treatment, we encourage you to take your nausea medication   If you develop nausea and vomiting, or diarrhea that is not controlled by your medication, call the clinic.  The clinic phone number is [phone]. Office hours are Monday-Friday 8:30am-5:00pm.  BELOW ARE SYMPTOMS THAT SHOULD BE REPORTED IMMEDIATELY:  *FEVER GREATER THAN 101.0 F  *CHILLS WITH OR WITHOUT FEVER  NAUSEA AND VOMITING THAT IS NOT CONTROLLED WITH YOUR NAUSEA MEDICATION  *UNUSUAL SHORTNESS OF BREATH  *UNUSUAL BRUISING OR BLEEDING  TENDERNESS IN MOUTH AND THROAT WITH OR WITHOUT PRESENCE OF ULCERS  *URINARY PROBLEMS  *BOWEL PROBLEMS  UNUSUAL RASH Items with * indicate a potential emergency and should be followed up as soon as possible. If you have an emergency after office hours please contact your primary care physician or go to the nearest emergency department.  Please call the clinic during office hours if you have any questions or concerns.   You may also contact the Patient Navigator at [phone] should you have any questions or need assistance in obtaining follow up care.      Resources For Cancer Patients and their Caregivers ? American Cancer Society: Can assist with transportation, wigs, general needs, runs Look Good Feel Better.        [phone] ? Cancer Care: Provides financial assistance, online support groups, medication/co-pay assistance.   1-800-813-HOPE 769 281 2771) ? [place] Assists [place] cancer patients and their families through emotional , educational and financial support.  313-122-8755 ? [place] DSS Where to apply for food stamps, Medicaid and utility assistance. 325-356-7431 ? RCATS: Transportation to medical appointments. 254-367-5170 ? Social Security Administration: May apply for disability if have a Stage IV cancer. ([phone] [phone] ? [employer], Disability and Transit Services: Assists with nutrition, care and transit needs. [phone]

## 2016-09-13 NOTE — Progress Notes (Signed)
Erik Hampshire, MD [place] [address]  Adenocarcinoma of right lung [facility]) - Plan: CBC with Differential, Comprehensive metabolic panel  Gastroesophageal reflux disease without esophagitis - Plan: esomeprazole (NEXIUM) 40 MG capsule  Acute bronchitis, unspecified organism - Plan: azithromycin (ZITHROMAX) 250 MG tablet  CURRENT THERAPY: concurrent CHEMO/XRT   Adenocarcinoma of right lung ([place])   07/01/2016 PET scan    6.3 x 3.7 cm right lower lobe mass, suspicious for primary bronchogenic neoplasm, as described above. Hypermetabolic thoracic nodal metastases, as above. Additional right perihilar hypermetabolism, indeterminate. Associated right middle lobe atelectasis/collapse.      07/19/2016 Procedure    Bronchoscopy with brushings and biopsies and endobronchial ultrasound with mediastinal lymph node aspirations by Dr. Roxan Hockey      07/21/2016 Pathology Results    Lung, biopsy, Right Middle Lobe - LUNG TISSUE WITH SQUAMOUS METAPLASIA. - NO MALIGNANCY IDENTIFIED.      07/21/2016 Pathology Results    FINE NEEDLE ASPIRATION, ENDOSCOPIC (A) LEVEL 7 (SPECIMEN 1 OF 3, COLLECTED ON 07/19/16): MALIGNANT CELLS CONSISTENT WITH ADENOCARCINOMA.      07/21/2016 Pathology Results    FINE NEEDLE ASPIRATION, EBUS, 4R, B (SPECIMEN 2 OF 3, COLLECTED 07/19/16): MALIGNANT CELLS CONSISTENT WITH ADENOCARCINOMA.      07/21/2016 Pathology Results    FINE NEEDLE ASPIRATION, EBUS, BRUSHING, RIGHT MIDDLE LOBE, D (SPECIMEN 3 OF 3, COLLECTED 07/19/16): MALIGNANT CELLS CONSISTENT WITH ADENOCARCINOMA.      07/22/2016 Imaging    MRI brain- No evidence of intracranial metastases.      08/17/2016 -  Chemotherapy    The patient had palonosetron (ALOXI) injection 0.25 mg, 0.25 mg, Intravenous,  Once, 1 of 1 cycle  CISplatin (PLATINOL) 123 mg in sodium chloride 0.9 % 500 mL chemo infusion, 50 mg/m2 = 123 mg, Intravenous,  Once, 1 of 1 cycle  etoposide (VEPESID) 120 mg in  sodium chloride 0.9 % 500 mL chemo infusion, 50 mg/m2 = 120 mg, Intravenous,  Once, 1 of 1 cycle  fosaprepitant (EMEND) 150 mg, dexamethasone (DECADRON) 12 mg in sodium chloride 0.9 % 145 mL IVPB, , Intravenous,  Once, 1 of 1 cycle  for chemotherapy treatment.         INTERVAL HISTORY: Erik [name] 55 y.o. 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  Erik [name] is unaccompanied and presents in treatment chair. He is here for Day 29, Cycle #1 Cisplatin / Etoposide. I personally reviewed and went over laboratory studies with the patient.  "Ain't much changed, I'm still having heartburn and getting sick in the mornings". He has been taking the medications prescribed. He does not get sick every single morning. During the day, he is fine. The sick is not mucous-y but when he coughs it is. He does not take a nausea pill before going to bed.  The Magic Mouthwash helps his throat for a little while but doesn't stick around.  The Prilosec does not seem to be helping but it may be worse if he wasn't taking it. He reports his chest continues to hurt but doesn't all the time. He still experiences heart burn. He believes the chest pain is esophagus pain rather than heart. He says is it not a heaviness but periodic burning. He notes he has had heartburn before.   He continues to cough. He has been taking tablets as recommended by the pharmacist which have helped with some of the mucous but doesn't do much for  the cough. He reports yellow sputum production.  He continues to decrease his smoking.   He has not been as active this week.  Weight is overall stable.    Review of Systems  Constitutional: Negative.  Negative for chills, fever and weight loss.  HENT: Positive for sore throat.   Eyes: Negative for blurred vision and double vision.  Respiratory: Positive for cough and sputum production. Negative for hemoptysis.   Cardiovascular: Negative.  Negative for chest  pain.  Gastrointestinal: Positive for heartburn, nausea and vomiting.  Genitourinary: Negative.  Negative for dysuria.  Musculoskeletal: Negative.   Skin: Negative.   Neurological: Negative.  Negative for weakness and headaches.  Endo/Heme/Allergies: Negative.   Psychiatric/Behavioral: Positive for memory loss.       Memory issues associated with chemotherapy.  14 point review of systems was performed and is negative except as detailed under history of present illness and above   Past Medical History:  Diagnosis Date  . Arnold-Chiari syndrome ([place])   . Arthritis   . Chronic back pain   . Depression   . GERD (gastroesophageal reflux disease)   . Seasonal allergies   . Spinal stenosis of lumbar region   . 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 Wheezing     Past Surgical History:  Procedure Laterality Date  . BACK SURGERY    . MULTIPLE EXTRACTIONS WITH ALVEOLOPLASTY N/A 07/08/2014   Procedure: MULTIPLE EXTRACION WITH ALVEOLOPLASTY with EXCISION LESION RIGHT SIDE OF TONGUE;  Surgeon: Gae Bon, DDS;  Location: [facility];  Service: Oral Surgery;  Laterality: N/A;  . PORTACATH PLACEMENT Right 07/30/2016   Procedure: INSERTION PORT-A-CATH;  Surgeon: Vickie Epley, MD;  Location: [facility];  Service: Vascular;  Laterality: Right;  [name] VIDEO BRONCHOSCOPY WITH ENDOBRONCHIAL ULTRASOUND N/A 07/19/2016   Procedure: VIDEO BRONCHOSCOPY WITH ENDOBRONCHIAL ULTRASOUND;  Surgeon: [name], MD;  Location: [facility] OR;  Service: Thoracic;  Laterality: N/A;    Family History  Problem Relation Age of Onset  . Cancer Mother     Social History   Social History  . Marital status: Divorced    Spouse name: N/A  . Number of children: N/A  . Years of education: N/A   Social History Main Topics  . Smoking status: Current Every Day Smoker    Packs/day: 2.00    Years: 38.00    Types: Cigarettes  . Smokeless tobacco: Former Systems developer    Types: Snuff     Comment: using chantix-  smoking once in a while  . Alcohol use 1.8 oz/week    3 Cans of beer per week     Comment: reports drinking 2-3 beers a week  . Drug use:     Types: Hydrocodone  . Sexual activity: Not Currently   Other Topics Concern  . None   Social History Narrative  . None     PHYSICAL EXAMINATION  ECOG PERFORMANCE STATUS: 1 - Symptomatic but completely ambulatory   Vitals with BMI 09/13/2016  Height   Weight 258 lbs 13 oz  BMI   Systolic 258  Diastolic 71  Pulse 527  Respirations 18    Physical Exam  Constitutional: He is oriented to person, place, and time and well-developed, well-nourished, and in no distress. No distress.  HENT:  Head: Normocephalic and atraumatic.  Mouth/Throat: Oropharynx is clear and moist.  Eyes: Conjunctivae and EOM are normal. Pupils are equal, round, and reactive to light. Right eye exhibits no discharge. Left eye exhibits  no discharge.  Neck: Normal range of motion. Neck supple. No thyromegaly present.  Cardiovascular: Normal rate, regular rhythm and normal heart sounds.   Pulmonary/Chest: Effort normal. No respiratory distress. He has wheezes.  Coarse rhonchi. Wheezing scattered throughout.  Abdominal: Soft. Bowel sounds are normal. He exhibits no distension and no mass. There is no tenderness. There is no rebound and no guarding.  Obese  Musculoskeletal: Normal range of motion. He exhibits no edema.  Lymphadenopathy:    He has no cervical adenopathy.  Neurological: He is alert and oriented to person, place, and time. No cranial nerve deficit. Gait normal. Coordination normal.  Skin: Skin is warm and dry. He is not diaphoretic.  Nursing note and vitals reviewed.   LABORATORY DATA: I have reviewed the data as listed. CBC    Component Value Date/Time   WBC 3.9 (L) 09/13/2016 0930   RBC 3.90 (L) 09/13/2016 0930   HGB 12.8 (L) 09/13/2016 0930   HCT 38.7 (L) 09/13/2016 0930   PLT 140 (L) 09/13/2016 0930   MCV 99.2 09/13/2016 0930   MCH 32.8  09/13/2016 0930   MCHC 33.1 09/13/2016 0930   RDW 14.6 09/13/2016 0930   LYMPHSABS 0.6 (L) 09/13/2016 0930   MONOABS 0.5 09/13/2016 0930   EOSABS 0.0 09/13/2016 0930   BASOSABS 0.0 09/13/2016 0930      Chemistry      Component Value Date/Time   NA 138 09/13/2016 0930   K 3.8 09/13/2016 0930   CL 100 (L) 09/13/2016 0930   CO2 33 (H) 09/13/2016 0930   BUN 12 09/13/2016 0930   CREATININE 0.68 09/13/2016 0930      Component Value Date/Time   CALCIUM 8.9 09/13/2016 0930   ALKPHOS 82 09/13/2016 0930   AST 33 09/13/2016 0930   ALT 28 09/13/2016 0930   BILITOT 0.5 09/13/2016 0930      RADIOGRAPHIC STUDIES: I have personally reviewed the radiological images as listed and agreed with the findings in the report. Study Result   CLINICAL DATA:  Recently diagnosed right lower lobe lung cancer. Initial staging.  EXAM: MRI HEAD WITHOUT AND WITH CONTRAST  TECHNIQUE: Multiplanar, multiecho pulse sequences of the brain and surrounding structures were obtained without and with intravenous contrast.  CONTRAST:  46m MULTIHANCE GADOBENATE DIMEGLUMINE 529 MG/ML IV SOLN  COMPARISON:  None.  FINDINGS: There is no evidence of acute infarct, intracranial hemorrhage, mass, midline shift, or extra-axial fluid collection. The ventricles and sulci are normal. The cerebellar tonsils minimally extend below the foramen magnum, within normal limits. Small foci of T2 hyperintensity in the cerebral white matter are mildly advanced for age.  A small developmental venous anomaly is noted in the right cerebellum. No abnormal parenchymal or meningeal enhancement suggestive of metastatic disease is seen. Motion artifact on postcontrast imaging could potentially obscure very small lesions.  Orbits are unremarkable. No significant inflammatory disease is seen in the paranasal sinuses are mastoid air cells. The major intracranial vascular flow voids are preserved with the left vertebral  artery being dominant. No focal osseous lesion is identified.  IMPRESSION: 1. No evidence of intracranial metastases. 2. Mild cerebral white matter disease, nonspecific but may reflect chronic small vessel ischemia.   Electronically Signed   By: ALogan BoresM.D.   On: 07/22/2016 13:56    PATHOLOGY: As above in oncology history.    ASSESSMENT AND PLAN:  Stage IIIA (T3N2M0) adenocarcinoma of right lung Tobacco Use GERD Radiation induced esophagitis, mild Nausea Bronchitis   He is overall  doing well.  Continue with concomitant chemoXRT consisting of Cisplatin Day 1, 8, 29, 33 and Etoposide days 1-5 and 29-33 with curative intent.  Labs reviewed with the patient. Results are noted above.   He is here for Day 29, Cycle #1 Cisplatin / Etoposide.  He complains of continued heartburn, cough, yellow sputum production, and morning nausea/vomiting.   I have written him a prescription for nexium twice daily since his GERD is poorly managed with current Prilosec.   He has coarse rhonchi and wheezing throughout. I have written him a prescription for a Z-pak to resolve cough and sputum production.  I addressed the importance of smoking cessation with the patient in detail.  We discussed the health benefits of cessation.  We discussed the health detriments of ongoing tobacco use including but not limited to COPD, heart disease and malignancy. We reviewed the multiple options for cessation. We discussed other alternatives to quit such as chantix, wellbutrin. We will continue to address this moving forward.  The patient will begin taking an anti nausea medication at night as well as in the mornings.   He is scheduled for follow up with [name], PAC on 09/20/2016.  Orders Placed This Encounter  Procedures  . CBC with Differential    Standing Status:   Future    Standing Expiration Date:   09/13/2017  . Comprehensive metabolic panel    Standing Status:   Future    Standing  Expiration Date:   09/13/2017   Meds ordered this encounter  Medications  . esomeprazole (NEXIUM) 40 MG capsule    Sig: Take 1 capsule (40 mg total) by mouth 2 (two) times daily before a meal.    Dispense:  60 capsule    Refill:  1  . azithromycin (ZITHROMAX) 250 MG tablet    Sig: Take 2 tabs on day one  then 1 tab daily thereafter until all gone.    Dispense:  6 each    Refill:  0    All questions were answered. The patient knows to call the clinic with any problems, questions or concerns. We can certainly see the patient much sooner if necessary.  This document serves as a record of services personally performed by Ancil Linsey, MD. It was created on her behalf by Arlyce Harman, a trained medical scribe. The creation of this record is based on the scribe's personal observations and the provider's statements to them. This document has been checked and approved by the attending provider.  I have reviewed the above documentation for accuracy and completeness and I agree with the above.  This note is electronically signed [id number] Cyril Mourning, MD  09/18/2016 6:27 PM

## 2016-09-13 NOTE — Progress Notes (Signed)
Erik [name] tolerated chemo tx with hydration well without complaints or incident. Labs reviewed prior to administering chemo and pt voided 200 ml prior to administering the Cisplatin. Pt discharged with port left accessed and saline locked. Port flushed per protocol. VSS upon discharge. Pt discharged self ambulatory in satisfactory condition

## 2016-09-14 ENCOUNTER — Ambulatory Visit (HOSPITAL_COMMUNITY): Payer: Medicare Other

## 2016-09-14 ENCOUNTER — Encounter (HOSPITAL_BASED_OUTPATIENT_CLINIC_OR_DEPARTMENT_OTHER): Payer: Medicare Other

## 2016-09-14 VITALS — BP 133/80 | HR 86 | Temp 98.1°F | Resp 18

## 2016-09-14 DIAGNOSIS — C342 Malignant neoplasm of middle lobe, bronchus or lung: Secondary | ICD-10-CM | POA: Diagnosis not present

## 2016-09-14 DIAGNOSIS — Z5111 Encounter for antineoplastic chemotherapy: Secondary | ICD-10-CM | POA: Diagnosis present

## 2016-09-14 DIAGNOSIS — C3491 Malignant neoplasm of unspecified part of right bronchus or lung: Secondary | ICD-10-CM

## 2016-09-14 MED ORDER — SODIUM CHLORIDE 0.9% FLUSH
10.0000 mL | INTRAVENOUS | Status: DC | PRN
  Administered 2016-09-14: 10 mL
  Filled 2016-09-14: qty 10

## 2016-09-14 MED ORDER — SODIUM CHLORIDE 0.9 % IV SOLN
Freq: Once | INTRAVENOUS | Status: AC
  Administered 2016-09-14: 10:00:00 via INTRAVENOUS

## 2016-09-14 MED ORDER — SODIUM CHLORIDE 0.9 % IV SOLN
10.0000 mg | Freq: Once | INTRAVENOUS | Status: AC
  Administered 2016-09-14: 10 mg via INTRAVENOUS
  Filled 2016-09-14: qty 1

## 2016-09-14 MED ORDER — SODIUM CHLORIDE 0.9 % IV SOLN
50.0000 mg/m2 | Freq: Once | INTRAVENOUS | Status: AC
  Administered 2016-09-14: 120 mg via INTRAVENOUS
  Filled 2016-09-14: qty 6

## 2016-09-14 MED ORDER — HEPARIN SOD (PORK) LOCK FLUSH 100 UNIT/ML IV SOLN
500.0000 [IU] | Freq: Once | INTRAVENOUS | Status: AC | PRN
  Administered 2016-09-14: 500 [IU]
  Filled 2016-09-14: qty 5

## 2016-09-14 NOTE — Patient Instructions (Signed)
[facility] Discharge Instructions for Patients Receiving Chemotherapy   Beginning January 23rd 2017 lab work for the [facility] will be done in the  Main lab at [facility] on 1st floor. If you have a lab appointment with the [facility] please come in thru the  Main Entrance and check in at the main information desk   Today you received the following chemotherapy agents VP-16. Follow-up as scheduled. Call clinic for any questions or concerns  To help prevent nausea and vomiting after your treatment, we encourage you to take your nausea medication   If you develop nausea and vomiting, or diarrhea that is not controlled by your medication, call the clinic.  The clinic phone number is [phone]. Office hours are Monday-Friday 8:30am-5:00pm.  BELOW ARE SYMPTOMS THAT SHOULD BE REPORTED IMMEDIATELY:  *FEVER GREATER THAN 101.0 F  *CHILLS WITH OR WITHOUT FEVER  NAUSEA AND VOMITING THAT IS NOT CONTROLLED WITH YOUR NAUSEA MEDICATION  *UNUSUAL SHORTNESS OF BREATH  *UNUSUAL BRUISING OR BLEEDING  TENDERNESS IN MOUTH AND THROAT WITH OR WITHOUT PRESENCE OF ULCERS  *URINARY PROBLEMS  *BOWEL PROBLEMS  UNUSUAL RASH Items with * indicate a potential emergency and should be followed up as soon as possible. If you have an emergency after office hours please contact your primary care physician or go to the nearest emergency department.  Please call the clinic during office hours if you have any questions or concerns.   You may also contact the Patient Navigator at [phone] should you have any questions or need assistance in obtaining follow up care.      Resources For Cancer Patients and their Caregivers ? American Cancer Society: Can assist with transportation, wigs, general needs, runs Look Good Feel Better.        954-329-1305 ? Cancer Care: Provides financial assistance, online support groups, medication/co-pay assistance.  1-800-813-HOPE  6301042618) ? [place] Assists [place] cancer patients and their families through emotional , educational and financial support.  (859)737-5834 ? [place] DSS Where to apply for food stamps, Medicaid and utility assistance. 206-484-3912 ? [employer]ATS: Transportation to medical appointments. (404)452-6685 ? Social Security Administration: May apply for disability if have a Stage IV cancer. [phone] ? [employer], Disability and Transit Services: Assists with nutrition, care and transit needs. [phone]

## 2016-09-14 NOTE — Progress Notes (Signed)
Erik [name] tolerated chemo tx well without complaints or incident. VSS upon discharge.Pt discharged self ambulatory in satisfactory condition

## 2016-09-15 ENCOUNTER — Other Ambulatory Visit (HOSPITAL_COMMUNITY): Payer: Self-pay | Admitting: Pharmacist

## 2016-09-15 ENCOUNTER — Ambulatory Visit (HOSPITAL_COMMUNITY): Payer: Medicare Other

## 2016-09-15 ENCOUNTER — Encounter (HOSPITAL_BASED_OUTPATIENT_CLINIC_OR_DEPARTMENT_OTHER): Payer: Medicare Other

## 2016-09-15 ENCOUNTER — Encounter (HOSPITAL_COMMUNITY): Payer: Self-pay

## 2016-09-15 VITALS — BP 148/81 | HR 83 | Temp 97.6°F | Resp 16 | Wt 264.0 lb

## 2016-09-15 DIAGNOSIS — Z5111 Encounter for antineoplastic chemotherapy: Secondary | ICD-10-CM

## 2016-09-15 DIAGNOSIS — C3491 Malignant neoplasm of unspecified part of right bronchus or lung: Secondary | ICD-10-CM | POA: Diagnosis not present

## 2016-09-15 MED ORDER — SODIUM CHLORIDE 0.9 % IV SOLN
10.0000 mg | Freq: Once | INTRAVENOUS | Status: AC
  Administered 2016-09-15: 10 mg via INTRAVENOUS
  Filled 2016-09-15: qty 1

## 2016-09-15 MED ORDER — SODIUM CHLORIDE 0.9% FLUSH
10.0000 mL | INTRAVENOUS | Status: DC | PRN
  Administered 2016-09-15: 10 mL
  Filled 2016-09-15: qty 10

## 2016-09-15 MED ORDER — HEPARIN SOD (PORK) LOCK FLUSH 100 UNIT/ML IV SOLN
500.0000 [IU] | Freq: Once | INTRAVENOUS | Status: AC | PRN
  Administered 2016-09-15: 500 [IU]

## 2016-09-15 MED ORDER — SODIUM CHLORIDE 0.9 % IV SOLN
50.0000 mg/m2 | Freq: Once | INTRAVENOUS | Status: AC
  Administered 2016-09-15: 120 mg via INTRAVENOUS
  Filled 2016-09-15: qty 6

## 2016-09-15 MED ORDER — HEPARIN SOD (PORK) LOCK FLUSH 100 UNIT/ML IV SOLN
INTRAVENOUS | Status: AC
  Filled 2016-09-15: qty 5

## 2016-09-15 MED ORDER — SODIUM CHLORIDE 0.9 % IV SOLN
Freq: Once | INTRAVENOUS | Status: AC
  Administered 2016-09-15: 10:00:00 via INTRAVENOUS

## 2016-09-15 NOTE — Patient Instructions (Signed)
[facility] Discharge Instructions for Patients Receiving Chemotherapy   Beginning January 23rd 2017 lab work for the [facility] will be done in the  Main lab at [facility] on 1st floor. If you have a lab appointment with the [facility] please come in thru the  Main Entrance and check in at the main information desk   Today you received the following chemotherapy agents Etopiside  To help prevent nausea and vomiting after your treatment, we encourage you to take your nausea medication      If you develop nausea and vomiting, or diarrhea that is not controlled by your medication, call the clinic.  The clinic phone number is [phone]. Office hours are Monday-Friday 8:30am-5:00pm.  BELOW ARE SYMPTOMS THAT SHOULD BE REPORTED IMMEDIATELY:  *FEVER GREATER THAN 101.0 F  *CHILLS WITH OR WITHOUT FEVER  NAUSEA AND VOMITING THAT IS NOT CONTROLLED WITH YOUR NAUSEA MEDICATION  *UNUSUAL SHORTNESS OF BREATH  *UNUSUAL BRUISING OR BLEEDING  TENDERNESS IN MOUTH AND THROAT WITH OR WITHOUT PRESENCE OF ULCERS  *URINARY PROBLEMS  *BOWEL PROBLEMS  UNUSUAL RASH Items with * indicate a potential emergency and should be followed up as soon as possible. If you have an emergency after office hours please contact your primary care physician or go to the nearest emergency department.  Please call the clinic during office hours if you have any questions or concerns.   You may also contact the Patient Navigator at [phone] should you have any questions or need assistance in obtaining follow up care.      Resources For Cancer Patients and their Caregivers ? American Cancer Society: Can assist with transportation, wigs, general needs, runs Look Good Feel Better.        ([phone] ? Cancer Care: Provides financial assistance, online support groups, medication/co-pay assistance.  1-800-813-HOPE (712) 256-4513) ? [place] Assists [place]  cancer patients and their families through emotional , educational and financial support.  216-613-3755 ? [place] DSS Where to apply for food stamps, Medicaid and utility assistance. 646-834-3603 ? RCATS: Transportation to medical appointments. (734)381-4744 ? Social Security Administration: May apply for disability if have a Stage IV cancer. 854-778-4562 334-694-4523 ? [employer], Disability and Transit Services: Assists with nutrition, care and transit needs. [phone]

## 2016-09-15 NOTE — Progress Notes (Signed)
Chemotherapy given today per orders, no problems. Vitals stable, discharged ambulatory from clinic.follow up as scheduled.

## 2016-09-16 ENCOUNTER — Encounter (HOSPITAL_BASED_OUTPATIENT_CLINIC_OR_DEPARTMENT_OTHER): Payer: Medicare Other

## 2016-09-16 ENCOUNTER — Encounter: Payer: Self-pay | Admitting: Dietician

## 2016-09-16 VITALS — BP 143/77 | HR 88 | Temp 98.6°F | Resp 18 | Wt 264.6 lb

## 2016-09-16 DIAGNOSIS — Z5111 Encounter for antineoplastic chemotherapy: Secondary | ICD-10-CM

## 2016-09-16 DIAGNOSIS — C3491 Malignant neoplasm of unspecified part of right bronchus or lung: Secondary | ICD-10-CM

## 2016-09-16 MED ORDER — SODIUM CHLORIDE 0.9 % IV SOLN
Freq: Once | INTRAVENOUS | Status: AC
  Administered 2016-09-16: 09:00:00 via INTRAVENOUS

## 2016-09-16 MED ORDER — SODIUM CHLORIDE 0.9 % IV SOLN
50.0000 mg/m2 | Freq: Once | INTRAVENOUS | Status: AC
  Administered 2016-09-16: 120 mg via INTRAVENOUS
  Filled 2016-09-16: qty 6

## 2016-09-16 MED ORDER — HEPARIN SOD (PORK) LOCK FLUSH 100 UNIT/ML IV SOLN
500.0000 [IU] | Freq: Once | INTRAVENOUS | Status: AC | PRN
  Administered 2016-09-16: 500 [IU]

## 2016-09-16 MED ORDER — SODIUM CHLORIDE 0.9 % IV SOLN
10.0000 mg | Freq: Once | INTRAVENOUS | Status: AC
  Administered 2016-09-16: 10 mg via INTRAVENOUS
  Filled 2016-09-16: qty 1

## 2016-09-16 MED ORDER — SODIUM CHLORIDE 0.9% FLUSH
10.0000 mL | INTRAVENOUS | Status: DC | PRN
  Administered 2016-09-16: 10 mL
  Filled 2016-09-16: qty 10

## 2016-09-16 MED ORDER — HEPARIN SOD (PORK) LOCK FLUSH 100 UNIT/ML IV SOLN
INTRAVENOUS | Status: AC
  Filled 2016-09-16: qty 5

## 2016-09-16 NOTE — Progress Notes (Signed)
Patient tolerated infusion well.  VSS.  Patient ambulatory and stable upon discharge.

## 2016-09-16 NOTE — Patient Instructions (Signed)
[facility] Discharge Instructions for Patients Receiving Chemotherapy   Beginning January 23rd 2017 lab work for the [facility] will be done in the  Main lab at [facility] on 1st floor. If you have a lab appointment with the [facility] please come in thru the  Main Entrance and check in at the main information desk   Today you received the following chemotherapy agent: Etoposide.  If you develop nausea and vomiting, or diarrhea that is not controlled by your medication, call the clinic.  The clinic phone number is [phone]. Office hours are Monday-Friday 8:30am-5:00pm.  BELOW ARE SYMPTOMS THAT SHOULD BE REPORTED IMMEDIATELY:  *FEVER GREATER THAN 101.0 F  *CHILLS WITH OR WITHOUT FEVER  NAUSEA AND VOMITING THAT IS NOT CONTROLLED WITH YOUR NAUSEA MEDICATION  *UNUSUAL SHORTNESS OF BREATH  *UNUSUAL BRUISING OR BLEEDING  TENDERNESS IN MOUTH AND THROAT WITH OR WITHOUT PRESENCE OF ULCERS  *URINARY PROBLEMS  *BOWEL PROBLEMS  UNUSUAL RASH Items with * indicate a potential emergency and should be followed up as soon as possible. If you have an emergency after office hours please contact your primary care physician or go to the nearest emergency department.  Please call the clinic during office hours if you have any questions or concerns.   You may also contact the Patient Navigator at [phone] should you have any questions or need assistance in obtaining follow up care.      Resources For Cancer Patients and their Caregivers ? American Cancer Society: Can assist with transportation, wigs, general needs, runs Look Good Feel Better.        ([phone] ? Cancer Care: Provides financial assistance, online support groups, medication/co-pay assistance.  1-800-813-HOPE (772) 591-0536) ? [place] Assists [place] cancer patients and their families through emotional , educational and financial support.   469-539-0917 ? [place] DSS Where to apply for food stamps, Medicaid and utility assistance. (986) 138-4602 ? RCATS: Transportation to medical appointments. 276-657-8972 ? Social Security Administration: May apply for disability if have a Stage IV cancer. [phone] ? [employer], Disability and Transit Services: Assists with nutrition, care and transit needs. [phone]

## 2016-09-16 NOTE — Progress Notes (Signed)
Following up with pt in person. Had spoken with over phone regarding radiation induced swallowing pain.   Contacted Pt by visiting during his infusion   Wt Readings from Last 10 Encounters:  09/16/16 264 lb 9.6 oz (120 kg)  09/15/16 264 lb (119.7 kg)  09/13/16 258 lb 12.8 oz (117.4 kg)  08/30/16 255 lb (115.7 kg)  08/23/16 254 lb 12.8 oz (115.6 kg)  08/20/16 267 lb 3.2 oz (121.2 kg)  08/19/16 270 lb 3.2 oz (122.6 kg)  08/18/16 270 lb 6.4 oz (122.7 kg)  08/17/16 260 lb (117.9 kg)  07/23/16 262 lb 14.4 oz (119.3 kg)   Patient weight has bounced back since first spoke with. However, pt says his weight is 248 lbs? He notes a large decline in his cognition and ability to remember since he began chemotherapy.   Patient reports oral intake as poor-fair and is suffering from symptoms including nausea, poor appetite, impaired cognition, sore throat, mild constipation.   Pt says he has received his case of Ensure that was ordered. He is drinking one of these in the morning now. His second meal is typically given to him at the cancer center. He will eat a meal when he returns home. He gave an example of [place] and Lucendia Herrlich.   In general, patient says he "just doesn't feel good". He says he knows he isnt eating as much largely related to his general malaise and decreased appetite. Nausea apparently had been a large problem, but he says this is well controlled at this time with his new antiemetic regimen.  He is not receiving his pain meds from [facility].  He has chronic pain and because he is receiving pain meds from another provider, his pain meds cannot be managed by oncology MD. He does note that he believes his pain meds will be increased in the future. He says he is accustomed to large amounts of pain and has had chronic pain since 55 y/o. He is advised to continue to follow the education handout on managing a sore throat.   He has some mild constipation. It is recommended he try an OTC stool  softener/laxative.   RD recommended that pt continue to sip on the Ensures throughout the entire day. This will help will ovecome his poor appetite. He is provided with a second case today.   RD gave some education on which items may worsen or help his nausea. From his report, his does not sound like his diet plays any part in his nausea.   Left case of Vanilla Ensure  Burtis Junes RD, LDN, [facility] [employer] Pager: [phone] 09/16/2016 10:57 AM

## 2016-09-17 ENCOUNTER — Ambulatory Visit (HOSPITAL_COMMUNITY): Payer: Medicare Other

## 2016-09-17 ENCOUNTER — Encounter (HOSPITAL_BASED_OUTPATIENT_CLINIC_OR_DEPARTMENT_OTHER): Payer: Medicare Other

## 2016-09-17 VITALS — BP 148/89 | HR 81 | Temp 98.1°F | Resp 16

## 2016-09-17 DIAGNOSIS — C3491 Malignant neoplasm of unspecified part of right bronchus or lung: Secondary | ICD-10-CM

## 2016-09-17 DIAGNOSIS — Z5111 Encounter for antineoplastic chemotherapy: Secondary | ICD-10-CM

## 2016-09-17 MED ORDER — SODIUM CHLORIDE 0.9 % IV SOLN
50.0000 mg/m2 | Freq: Once | INTRAVENOUS | Status: AC
  Administered 2016-09-17: 120 mg via INTRAVENOUS
  Filled 2016-09-17: qty 6

## 2016-09-17 MED ORDER — SODIUM CHLORIDE 0.9 % IV SOLN
10.0000 mg | Freq: Once | INTRAVENOUS | Status: AC
  Administered 2016-09-17: 10 mg via INTRAVENOUS
  Filled 2016-09-17: qty 1

## 2016-09-17 MED ORDER — SODIUM CHLORIDE 0.9 % IV SOLN
Freq: Once | INTRAVENOUS | Status: AC
  Administered 2016-09-17: 10:00:00 via INTRAVENOUS

## 2016-09-17 MED ORDER — HEPARIN SOD (PORK) LOCK FLUSH 100 UNIT/ML IV SOLN
500.0000 [IU] | Freq: Once | INTRAVENOUS | Status: AC | PRN
  Administered 2016-09-17: 500 [IU]
  Filled 2016-09-17: qty 5

## 2016-09-17 MED ORDER — SODIUM CHLORIDE 0.9% FLUSH: 10.0000 mL | INTRAVENOUS | Status: DC | PRN

## 2016-09-17 NOTE — Patient Instructions (Signed)
[facility] Discharge Instructions for Patients Receiving Chemotherapy   Beginning January 23rd 2017 lab work for the [facility] will be done in the  Main lab at [facility] on 1st floor. If you have a lab appointment with the [place] please come in thru the  Main Entrance and check in at the main information desk   Today you received the following chemotherapy agent: Etoposide.     If you develop nausea and vomiting, or diarrhea that is not controlled by your medication, call the clinic.  The clinic phone number is [phone]. Office hours are Monday-Friday 8:30am-5:00pm.  BELOW ARE SYMPTOMS THAT SHOULD BE REPORTED IMMEDIATELY:  *FEVER GREATER THAN 101.0 F  *CHILLS WITH OR WITHOUT FEVER  NAUSEA AND VOMITING THAT IS NOT CONTROLLED WITH YOUR NAUSEA MEDICATION  *UNUSUAL SHORTNESS OF BREATH  *UNUSUAL BRUISING OR BLEEDING  TENDERNESS IN MOUTH AND THROAT WITH OR WITHOUT PRESENCE OF ULCERS  *URINARY PROBLEMS  *BOWEL PROBLEMS  UNUSUAL RASH Items with * indicate a potential emergency and should be followed up as soon as possible. If you have an emergency after office hours please contact your primary care physician or go to the nearest emergency department.  Please call the clinic during office hours if you have any questions or concerns.   You may also contact the Patient Navigator at [phone] should you have any questions or need assistance in obtaining follow up care.      Resources For Cancer Patients and their Caregivers ? American Cancer Society: Can assist with transportation, wigs, general needs, runs Look Good Feel Better.        3180979816 ? Cancer Care: Provides financial assistance, online support groups, medication/co-pay assistance.  1-800-813-HOPE (864) 855-7143) ? [place] Assists [place] cancer patients and their families through emotional , educational and financial support.   [phone] ? [place] DSS Where to apply for food stamps, Medicaid and utility assistance. [phone] ? RCATS: Transportation to medical appointments. [phone] ? Social Security Administration: May apply for disability if have a Stage IV cancer. [phone] ? [employer], Disability and Transit Services: Assists with nutrition, care and transit needs. [phone]

## 2016-09-17 NOTE — Progress Notes (Signed)
Patient tolerated infusion well.  VSS.  Patient ambulatory and stable upon discharge from clinic.   

## 2016-09-18 ENCOUNTER — Encounter (HOSPITAL_COMMUNITY): Payer: Self-pay | Admitting: Hematology & Oncology

## 2016-09-20 ENCOUNTER — Ambulatory Visit (HOSPITAL_COMMUNITY): Payer: Medicare Other

## 2016-09-20 ENCOUNTER — Encounter (HOSPITAL_COMMUNITY): Payer: Self-pay | Admitting: Oncology

## 2016-09-20 ENCOUNTER — Ambulatory Visit (HOSPITAL_COMMUNITY): Payer: Medicare Other | Admitting: Oncology

## 2016-09-20 ENCOUNTER — Encounter (HOSPITAL_BASED_OUTPATIENT_CLINIC_OR_DEPARTMENT_OTHER): Payer: Medicare Other | Admitting: Oncology

## 2016-09-20 ENCOUNTER — Encounter (HOSPITAL_BASED_OUTPATIENT_CLINIC_OR_DEPARTMENT_OTHER): Payer: Medicare Other

## 2016-09-20 VITALS — BP 129/90 | HR 110 | Temp 98.2°F | Resp 16 | Wt 250.8 lb

## 2016-09-20 VITALS — BP 122/86 | HR 93 | Temp 98.1°F | Resp 16

## 2016-09-20 DIAGNOSIS — Z716 Tobacco abuse counseling: Secondary | ICD-10-CM | POA: Diagnosis not present

## 2016-09-20 DIAGNOSIS — R093 Abnormal sputum: Secondary | ICD-10-CM | POA: Diagnosis not present

## 2016-09-20 DIAGNOSIS — F17210 Nicotine dependence, cigarettes, uncomplicated: Secondary | ICD-10-CM

## 2016-09-20 DIAGNOSIS — Z5111 Encounter for antineoplastic chemotherapy: Secondary | ICD-10-CM

## 2016-09-20 DIAGNOSIS — K219 Gastro-esophageal reflux disease without esophagitis: Secondary | ICD-10-CM | POA: Diagnosis not present

## 2016-09-20 DIAGNOSIS — D751 Secondary polycythemia: Secondary | ICD-10-CM | POA: Diagnosis not present

## 2016-09-20 DIAGNOSIS — C3491 Malignant neoplasm of unspecified part of right bronchus or lung: Secondary | ICD-10-CM

## 2016-09-20 LAB — CBC WITH DIFFERENTIAL/PLATELET
BASOS ABS: 0 10*3/uL (ref 0.0–0.1)
BASOS PCT: 1 %
Eosinophils Absolute: 0 10*3/uL (ref 0.0–0.7)
Eosinophils Relative: 0 %
HEMATOCRIT: 39.1 % (ref 39.0–52.0)
HEMOGLOBIN: 13.3 g/dL (ref 13.0–17.0)
Lymphocytes Relative: 5 %
Lymphs Abs: 0.3 10*3/uL — ABNORMAL LOW (ref 0.7–4.0)
MCH: 33 pg (ref 26.0–34.0)
MCHC: 34 g/dL (ref 30.0–36.0)
MCV: 97 fL (ref 78.0–100.0)
Monocytes Absolute: 0.1 10*3/uL (ref 0.1–1.0)
Monocytes Relative: 1 %
NEUTROS ABS: 6.2 10*3/uL (ref 1.7–7.7)
NEUTROS PCT: 93 %
Platelets: 138 10*3/uL — ABNORMAL LOW (ref 150–400)
RBC: 4.03 MIL/uL — ABNORMAL LOW (ref 4.22–5.81)
RDW: 14.6 % (ref 11.5–15.5)
WBC: 6.6 10*3/uL (ref 4.0–10.5)

## 2016-09-20 LAB — COMPREHENSIVE METABOLIC PANEL
ALBUMIN: 3.7 g/dL (ref 3.5–5.0)
ALK PHOS: 80 U/L (ref 38–126)
ALT: 29 U/L (ref 17–63)
AST: 26 U/L (ref 15–41)
Anion gap: 8 (ref 5–15)
BILIRUBIN TOTAL: 1.2 mg/dL (ref 0.3–1.2)
BUN: 20 mg/dL (ref 6–20)
CALCIUM: 9.2 mg/dL (ref 8.9–10.3)
CO2: 30 mmol/L (ref 22–32)
Chloride: 98 mmol/L — ABNORMAL LOW (ref 101–111)
Creatinine, Ser: 0.66 mg/dL (ref 0.61–1.24)
GFR calc Af Amer: >60 mL/min (ref >60)
GFR calc non Af Amer: >60 mL/min (ref >60)
GLUCOSE: 176 mg/dL — AB (ref 65–99)
POTASSIUM: 4 mmol/L (ref 3.5–5.1)
Sodium: 136 mmol/L (ref 135–145)
TOTAL PROTEIN: 7.2 g/dL (ref 6.5–8.1)

## 2016-09-20 MED ORDER — HEPARIN SOD (PORK) LOCK FLUSH 100 UNIT/ML IV SOLN
500.0000 [IU] | Freq: Once | INTRAVENOUS | Status: AC | PRN
  Administered 2016-09-20: 500 [IU]
  Filled 2016-09-20: qty 5

## 2016-09-20 MED ORDER — SODIUM CHLORIDE 0.9 % IV SOLN
Freq: Once | INTRAVENOUS | Status: AC
  Administered 2016-09-20: 12:00:00 via INTRAVENOUS
  Filled 2016-09-20: qty 5

## 2016-09-20 MED ORDER — SODIUM CHLORIDE 0.9 % IV SOLN
50.0000 mg/m2 | Freq: Once | INTRAVENOUS | Status: AC
  Administered 2016-09-20: 123 mg via INTRAVENOUS
  Filled 2016-09-20: qty 50

## 2016-09-20 MED ORDER — POTASSIUM CHLORIDE 2 MEQ/ML IV SOLN
Freq: Once | INTRAVENOUS | Status: AC
  Administered 2016-09-20: 10:00:00 via INTRAVENOUS
  Filled 2016-09-20: qty 10

## 2016-09-20 MED ORDER — PALONOSETRON HCL INJECTION 0.25 MG/5ML
0.2500 mg | Freq: Once | INTRAVENOUS | Status: AC
  Administered 2016-09-20: 0.25 mg via INTRAVENOUS
  Filled 2016-09-20: qty 5

## 2016-09-20 MED ORDER — SODIUM CHLORIDE 0.9% FLUSH
10.0000 mL | INTRAVENOUS | Status: DC | PRN
  Administered 2016-09-20: 10 mL
  Filled 2016-09-20: qty 10

## 2016-09-20 MED ORDER — SODIUM CHLORIDE 0.9 % IV SOLN
Freq: Once | INTRAVENOUS | Status: AC
  Administered 2016-09-20: 12:00:00 via INTRAVENOUS

## 2016-09-20 NOTE — Progress Notes (Signed)
Erik Hampshire, MD [place] [address]  Adenocarcinoma of right lung [facility]) - Plan: CBC with Differential, Comprehensive metabolic panel, CT Abdomen Pelvis W Contrast, CT Chest W Contrast  CURRENT THERAPY: Cisplatin/Etoposide with XRT  INTERVAL HISTORY: Erik [name] 55 y.o. 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    Adenocarcinoma of right lung ([place])   07/01/2016 PET scan    6.3 x 3.7 cm right lower lobe mass, suspicious for primary bronchogenic neoplasm, as described above. Hypermetabolic thoracic nodal metastases, as above. Additional right perihilar hypermetabolism, indeterminate. Associated right middle lobe atelectasis/collapse.      07/19/2016 Procedure    Bronchoscopy with brushings and biopsies and endobronchial ultrasound with mediastinal lymph node aspirations by Dr. Roxan Hockey      07/21/2016 Pathology Results    Lung, biopsy, Right Middle Lobe - LUNG TISSUE WITH SQUAMOUS METAPLASIA. - NO MALIGNANCY IDENTIFIED.      07/21/2016 Pathology Results    FINE NEEDLE ASPIRATION, ENDOSCOPIC (A) LEVEL 7 (SPECIMEN 1 OF 3, COLLECTED ON 07/19/16): MALIGNANT CELLS CONSISTENT WITH ADENOCARCINOMA.      07/21/2016 Pathology Results    FINE NEEDLE ASPIRATION, EBUS, 4R, B (SPECIMEN 2 OF 3, COLLECTED 07/19/16): MALIGNANT CELLS CONSISTENT WITH ADENOCARCINOMA.      07/21/2016 Pathology Results    FINE NEEDLE ASPIRATION, EBUS, BRUSHING, RIGHT MIDDLE LOBE, D (SPECIMEN 3 OF 3, COLLECTED 07/19/16): MALIGNANT CELLS CONSISTENT WITH ADENOCARCINOMA.      07/22/2016 Imaging    MRI brain- No evidence of intracranial metastases.      08/17/2016 - 09/20/2016 Chemotherapy    The patient had palonosetron (ALOXI) injection 0.25 mg, 0.25 mg, Intravenous,  Once, 1 of 1 cycle  CISplatin (PLATINOL) 123 mg in sodium chloride 0.9 % 500 mL chemo infusion, 50 mg/m2 = 123 mg, Intravenous,  Once, 1 of 1 cycle  etoposide (VEPESID) 120 mg  in sodium chloride 0.9 % 500 mL chemo infusion, 50 mg/m2 = 120 mg, Intravenous,  Once, 1 of 1 cycle  fosaprepitant (EMEND) 150 mg, dexamethasone (DECADRON) 12 mg in sodium chloride 0.9 % 145 mL IVPB, , Intravenous,  Once, 1 of 1 cycle  for chemotherapy treatment.        08/17/2016 -  Radiation Therapy          He reports that his GERD is minimally, if any, improved with change in PPI from Prilosec to Nexium.    He continues with a sore throat and he notes that magic mouthwash is significantly helpful, but not long lasting.  "I think I am getting rash on my back."  He notes that it burns.  He reports fatigue and tiredness.  He notes some "chest burning."  He denies any chest pain or shortness of breath.  Review of Systems  Constitutional: Positive for malaise/fatigue and weight loss. Negative for chills and fever.  HENT: Negative.   Eyes: Negative.   Respiratory: Positive for cough, sputum production and wheezing. Negative for shortness of breath.   Cardiovascular: Positive for chest pain.  Gastrointestinal: Positive for heartburn. Negative for constipation, diarrhea, nausea and vomiting.  Genitourinary: Negative.  Negative for dysuria.  Musculoskeletal: Negative.   Skin: Positive for rash.  Neurological: Positive for weakness.  Endo/Heme/Allergies: Negative.   Psychiatric/Behavioral: Negative.     Past Medical History:  Diagnosis Date  . Arnold-Chiari syndrome ([place])   . Arthritis   . Chronic back pain   . Depression   . GERD (  gastroesophageal reflux disease)   . Seasonal allergies   . Spinal stenosis of lumbar region   . 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 Wheezing     Past Surgical History:  Procedure Laterality Date  . BACK SURGERY    . MULTIPLE EXTRACTIONS WITH ALVEOLOPLASTY N/A 07/08/2014   Procedure: MULTIPLE EXTRACION WITH ALVEOLOPLASTY with EXCISION LESION RIGHT SIDE OF TONGUE;  Surgeon: Gae Bon, DDS;  Location: [facility];  Service: Oral  Surgery;  Laterality: N/A;  . PORTACATH PLACEMENT Right 07/30/2016   Procedure: INSERTION PORT-A-CATH;  Surgeon: Vickie Epley, MD;  Location: [facility];  Service: Vascular;  Laterality: Right;  [name] VIDEO BRONCHOSCOPY WITH ENDOBRONCHIAL ULTRASOUND N/A 07/19/2016   Procedure: VIDEO BRONCHOSCOPY WITH ENDOBRONCHIAL ULTRASOUND;  Surgeon: [name], MD;  Location: [facility];  Service: Thoracic;  Laterality: N/A;    Family History  Problem Relation Age of Onset  . Cancer Mother     Social History   Social History  . Marital status: Divorced    Spouse name: N/A  . Number of children: N/A  . Years of education: N/A   Social History Main Topics  . Smoking status: Current Every Day Smoker    Packs/day: 2.00    Years: 38.00    Types: Cigarettes  . Smokeless tobacco: Former Systems developer    Types: Snuff     Comment: using chantix- smoking once in a while  . Alcohol use 1.8 oz/week    3 Cans of beer per week     Comment: reports drinking 2-3 beers a week  . Drug use:     Types: Hydrocodone  . Sexual activity: Not Currently   Other Topics Concern  . None   Social History Narrative  . None     PHYSICAL EXAMINATION  ECOG PERFORMANCE STATUS: 1 - Symptomatic but completely ambulatory  Vitals:   09/20/16 0900  BP: 129/90  Pulse: (!) 110  Resp: 16  Temp: 98.2 F (36.8 C)    GENERAL:alert, no distress, well nourished, well developed, comfortable, cooperative, obese, smiling and unaccompanied, in chemo-recliner. SKIN: skin color, texture, turgor are normal, no rashes or significant lesions HEAD: Normocephalic, No masses, lesions, tenderness or abnormalities EYES: normal, EOMI, Conjunctiva are pink and non-injected EARS: External ears normal OROPHARYNX:lips, buccal mucosa, and tongue normal and mucous membranes are moist  NECK: supple, no adenopathy, trachea midline LYMPH:  no palpable lymphadenopathy BREAST:not examined LUNGS: positive findings: rhonchi  diffusely HEART: regular  rate & rhythm, no murmurs and no gallops ABDOMEN:abdomen soft, obese and normal bowel sounds BACK: Back symmetric, no curvature. Erythema of back in the thoracic region, paraspinal. EXTREMITIES:less then 2 second capillary refill, no joint deformities, effusion, or inflammation, no skin discoloration, no cyanosis  NEURO: alert & oriented x 3 with fluent speech, no focal motor/sensory deficits, gait normal   LABORATORY DATA: CBC    Component Value Date/Time   WBC 6.6 09/20/2016 0947   RBC 4.03 (L) 09/20/2016 0947   HGB 13.3 09/20/2016 0947   HCT 39.1 09/20/2016 0947   PLT 138 (L) 09/20/2016 0947   MCV 97.0 09/20/2016 0947   MCH 33.0 09/20/2016 0947   MCHC 34.0 09/20/2016 0947   RDW 14.6 09/20/2016 0947   LYMPHSABS 0.3 (L) 09/20/2016 0947   MONOABS 0.1 09/20/2016 0947   EOSABS 0.0 09/20/2016 0947   BASOSABS 0.0 09/20/2016 0947      Chemistry      Component Value Date/Time   NA 136 09/20/2016 0947  K 4.0 09/20/2016 0947   CL 98 (L) 09/20/2016 0947   CO2 30 09/20/2016 0947   BUN 20 09/20/2016 0947   CREATININE 0.66 09/20/2016 0947      Component Value Date/Time   CALCIUM 9.2 09/20/2016 0947   ALKPHOS 80 09/20/2016 0947   AST 26 09/20/2016 0947   ALT 29 09/20/2016 0947   BILITOT 1.2 09/20/2016 0947        PENDING LABS:   RADIOGRAPHIC STUDIES:  No results found.   PATHOLOGY:    ASSESSMENT AND PLAN:  Adenocarcinoma of right lung (H[facility]) Stage IIIA (T3N2M0) adenocarcinoma of right lung.  S/P biopsy by Dr. Roxan Hockey on 07/19/2016 proving Stage III disease.  Concomitant chemoXRT with Cisplatin/Etoposide finishing on 09/20/2016 with curative intent.  Oncology history is updated.  He still has ~ 10 more days of XRT.    Pre-treatment labs today: CBC diff, CMET.  I personally reviewed and went over laboratory results with the patient.  The results are noted within this dictation.  Smoking cessation education is provided today.  He notes that he is down to < 1 ppd  (previously smoking 2 + ppd prior to Dx).  Recently, his PPI was changed to Nexium (from Prilosec) and he is on BID dosing.  He notes minimal, if any, improvement.  I have asked him to add Zantac to this treatment.  He was also given a Z-PAK on his last encounter.  He notes improvement, but ongoing yellow sputum production.  He denies any fevers.  Labs in 3-4 weeks: CBC diff, CMET.  Order placed for restaging tests: CT CAP with contrast in ~8 weeks.  Return in 3-4 weeks for follow-up.  He will then follow-up post-imaging to review results and further medical oncology recommendations.    ORDERS PLACED FOR THIS ENCOUNTER: Orders Placed This Encounter  Procedures  . CT Abdomen Pelvis W Contrast  . CT Chest W Contrast  . CBC with Differential  . Comprehensive metabolic panel    MEDICATIONS PRESCRIBED THIS ENCOUNTER: Meds ordered this encounter  Medications  . lidocaine (XYLOCAINE) 2 % solution    THERAPY PLAN:  Complete chemotherapy today and continue XRT as planned.  All questions were answered. The patient knows to call the clinic with any problems, questions or concerns. We can certainly see the patient much sooner if necessary.  Patient and plan discussed with Dr. Ancil Linsey and she is in agreement with the aforementioned.   This note is electronically signed by: Doy Mince 09/20/2016 6:38 PM

## 2016-09-20 NOTE — Patient Instructions (Signed)
[facility] Discharge Instructions for Patients Receiving Chemotherapy   Beginning January 23rd 2017 lab work for the [facility] will be done in the  Main lab at [facility] on 1st floor. If you have a lab appointment with the [facility] please come in thru the  Main Entrance and check in at the main information desk   Today you received the following chemotherapy agent: Cisplatin.     If you develop nausea and vomiting, or diarrhea that is not controlled by your medication, call the clinic.  The clinic phone number is [phone]. Office hours are Monday-Friday 8:30am-5:00pm.  BELOW ARE SYMPTOMS THAT SHOULD BE REPORTED IMMEDIATELY:  *FEVER GREATER THAN 101.0 F  *CHILLS WITH OR WITHOUT FEVER  NAUSEA AND VOMITING THAT IS NOT CONTROLLED WITH YOUR NAUSEA MEDICATION  *UNUSUAL SHORTNESS OF BREATH  *UNUSUAL BRUISING OR BLEEDING  TENDERNESS IN MOUTH AND THROAT WITH OR WITHOUT PRESENCE OF ULCERS  *URINARY PROBLEMS  *BOWEL PROBLEMS  UNUSUAL RASH Items with * indicate a potential emergency and should be followed up as soon as possible. If you have an emergency after office hours please contact your primary care physician or go to the nearest emergency department.  Please call the clinic during office hours if you have any questions or concerns.   You may also contact the Patient Navigator at [phone] should you have any questions or need assistance in obtaining follow up care.      Resources For Cancer Patients and their Caregivers ? American Cancer Society: Can assist with transportation, wigs, general needs, runs Look Good Feel Better.        [phone] ? Cancer Care: Provides financial assistance, online support groups, medication/co-pay assistance.  1-800-813-HOPE 4138468538) ? [place] Assists [place] cancer patients and their families through emotional , educational and financial support.   [phone] ? [place] DSS Where to apply for food stamps, Medicaid and utility assistance. 684-030-2657 ? RCATS: Transportation to medical appointments. 401-540-2115 ? Social Security Administration: May apply for disability if have a Stage IV cancer. [phone] ? [employer], Disability and Transit Services: Assists with nutrition, care and transit needs. [phone]

## 2016-09-20 NOTE — Assessment & Plan Note (Addendum)
Stage IIIA (T3N2M0) adenocarcinoma of right lung.  S/P biopsy by Dr. Roxan Hockey on 07/19/2016 proving Stage III disease.  Concomitant chemoXRT with Cisplatin/Etoposide finishing on 09/20/2016 with curative intent.  Oncology history is updated.  He still has ~ 10 more days of XRT.    Pre-treatment labs today: CBC diff, CMET.  I personally reviewed and went over laboratory results with the patient.  The results are noted within this dictation.  Smoking cessation education is provided today.  He notes that he is down to < 1 ppd (previously smoking 2 + ppd prior to Dx).  Recently, his PPI was changed to Nexium (from Prilosec) and he is on BID dosing.  He notes minimal, if any, improvement.  I have asked him to add Zantac to this treatment.  He was also given a Z-PAK on his last encounter.  He notes improvement, but ongoing yellow sputum production.  He denies any fevers.  Labs in 3-4 weeks: CBC diff, CMET.  Order placed for restaging tests: CT CAP with contrast in ~8 weeks.  Return in 3-4 weeks for follow-up.  He will then follow-up post-imaging to review results and further medical oncology recommendations.

## 2016-09-20 NOTE — Patient Instructions (Addendum)
Erik [name] at [facility] Discharge Instructions  RECOMMENDATIONS MADE BY THE CONSULTANT AND ANY TEST RESULTS WILL BE SENT TO YOUR REFERRING PHYSICIAN.  You were seen by [name] today. Continue Radiology Add Zantac to Nexium for GERD Follow up in 3-4 weeks with labs.  Thank you for choosing [place] at [facility] to provide your oncology and hematology care.  To afford each patient quality time with our provider, please arrive at least 15 minutes before your scheduled appointment time.   Beginning January 23rd 2017 lab work for the [facility] will be done in the  Main lab at [facility] on 1st floor. If you have a lab appointment with the [place] please come in thru the  Main Entrance and check in at the main information desk  You need to re-schedule your appointment should you arrive 10 or more minutes late.  We strive to give you quality time with our providers, and arriving late affects you and other patients whose appointments are after yours.  Also, if you no show three or more times for appointments you may be dismissed from the clinic at the providers discretion.     Again, thank you for choosing [facility].  Our hope is that these requests will decrease the amount of time that you wait before being seen by our physicians.       _____________________________________________________________  Should you have questions after your visit to [facility], please contact our office at [phone] between the hours of 8:30 a.m. and 4:30 p.m.  Voicemails left after 4:30 p.m. will not be returned until the following business day.  For prescription refill requests, have your pharmacy contact our office.         Resources For Cancer Patients and their Caregivers ? American Cancer Society: Can assist with transportation, wigs, general needs, runs Look Good Feel Better.        [phone] ? Cancer  Care: Provides financial assistance, online support groups, medication/co-pay assistance.  1-800-813-HOPE (213) 503-0366) ? [place] Assists [place] cancer patients and their families through emotional , educational and financial support.  [phone] ? [place] DSS Where to apply for food stamps, Medicaid and utility assistance. 782-359-0437 ? RCATS: Transportation to medical appointments. 670-477-9964 ? Social Security Administration: May apply for disability if have a Stage IV cancer. [phone] [phone] ? [employer], Disability and Transit Services: Assists with nutrition, care and transit needs. [place] Support Programs: '@10RELATIVEDAYS'$ @ > Cancer Support Group  2nd Tuesday of the month 1pm-2pm, Journey Room  > Creative Journey  3rd Tuesday of the month 1130am-1pm, Journey Room  > Look Good Feel Better  1st Wednesday of the month 10am-12 noon, Journey Room (Call [place] to register [phone])

## 2016-09-20 NOTE — Progress Notes (Signed)
Patient tolerated infusion well.  VSS.  Patient ambulatory and stable upon discharge.

## 2016-10-01 ENCOUNTER — Other Ambulatory Visit (HOSPITAL_COMMUNITY): Payer: Self-pay | Admitting: *Deleted

## 2016-10-01 DIAGNOSIS — C3491 Malignant neoplasm of unspecified part of right bronchus or lung: Secondary | ICD-10-CM

## 2016-10-01 MED ORDER — LEVOFLOXACIN 500 MG PO TABS: 500.0000 mg | ORAL_TABLET | Freq: Every day | ORAL | 0 refills | Status: DC

## 2016-10-01 MED ORDER — FLUCONAZOLE 100 MG PO TABS: 100.0000 mg | ORAL_TABLET | Freq: Every day | ORAL | 0 refills | Status: DC

## 2016-10-15 ENCOUNTER — Ambulatory Visit (HOSPITAL_COMMUNITY): Payer: Medicare Other | Admitting: Oncology

## 2016-10-19 ENCOUNTER — Encounter (HOSPITAL_COMMUNITY): Payer: Medicare Other | Attending: Hematology | Admitting: Oncology

## 2016-10-19 ENCOUNTER — Encounter (HOSPITAL_COMMUNITY): Payer: Self-pay | Admitting: Oncology

## 2016-10-19 DIAGNOSIS — C3491 Malignant neoplasm of unspecified part of right bronchus or lung: Secondary | ICD-10-CM | POA: Diagnosis present

## 2016-10-19 DIAGNOSIS — T66XXXA Radiation sickness, unspecified, initial encounter: Secondary | ICD-10-CM

## 2016-10-19 DIAGNOSIS — D751 Secondary polycythemia: Secondary | ICD-10-CM | POA: Insufficient documentation

## 2016-10-19 DIAGNOSIS — K208 Other esophagitis: Secondary | ICD-10-CM | POA: Diagnosis not present

## 2016-10-19 MED ORDER — MAGIC MOUTHWASH W/LIDOCAINE: ORAL | 1 refills | Status: DC

## 2016-10-19 MED ORDER — ONDANSETRON HCL 8 MG PO TABS: 8.0000 mg | ORAL_TABLET | Freq: Three times a day (TID) | ORAL | 2 refills | Status: DC | PRN

## 2016-10-19 NOTE — Patient Instructions (Signed)
[place] at [facility] Discharge Instructions  RECOMMENDATIONS MADE BY THE CONSULTANT AND ANY TEST RESULTS WILL BE SENT TO YOUR REFERRING PHYSICIAN.  You were seen today by [name] PA-C. Continue Guafenisen, continue increased water intake.  Refills given on Zofran and Mouthwash. Scans and follow up as scheduled.   Thank you for choosing [place] at [facility] to provide your oncology and hematology care.  To afford each patient quality time with our provider, please arrive at least 15 minutes before your scheduled appointment time.   Beginning January 23rd 2017 lab work for the [facility] will be done in the  Main lab at [facility] on 1st floor. If you have a lab appointment with the [place] please come in thru the  Main Entrance and check in at the main information desk  You need to re-schedule your appointment should you arrive 10 or more minutes late.  We strive to give you quality time with our providers, and arriving late affects you and other patients whose appointments are after yours.  Also, if you no show three or more times for appointments you may be dismissed from the clinic at the providers discretion.     Again, thank you for choosing [facility].  Our hope is that these requests will decrease the amount of time that you wait before being seen by our physicians.       _____________________________________________________________  Should you have questions after your visit to [facility], please contact our office at [phone] between the hours of 8:30 a.m. and 4:30 p.m.  Voicemails left after 4:30 p.m. will not be returned until the following business day.  For prescription refill requests, have your pharmacy contact our office.         Resources For Cancer Patients and their Caregivers ? American Cancer Society: Can assist with transportation, wigs, general needs, runs Look  Good Feel Better.        [phone] ? Cancer Care: Provides financial assistance, online support groups, medication/co-pay assistance.  1-800-813-HOPE (306) 576-1653) ? [place] Assists [place] cancer patients and their families through emotional , educational and financial support.  2700229183 ? [place] DSS Where to apply for food stamps, Medicaid and utility assistance. 201-395-4418 ? RCATS: Transportation to medical appointments. 951-034-5974 ? Social Security Administration: May apply for disability if have a Stage IV cancer. [phone] ? [employer], Disability and Transit Services: Assists with nutrition, care and transit needs. [place] Support Programs: '@10RELATIVEDAYS'$ @ > Cancer Support Group  2nd Tuesday of the month 1pm-2pm, Journey Room  > Creative Journey  3rd Tuesday of the month 1130am-1pm, Journey Room  > Look Good Feel Better  1st Wednesday of the month 10am-12 noon, Journey Room (Call [place] to register [phone])

## 2016-10-19 NOTE — Assessment & Plan Note (Addendum)
Stage IIIA (T3N2M0) adenocarcinoma of right lung.  S/P biopsy by Dr. Roxan Hockey on 07/19/2016 proving Stage III disease.  Concomitant chemoXRT with Cisplatin/Etoposide finishing on 09/20/2016 with curative intent.  Oncology history is updated.    Pre-treatment labs today: CBC diff, CMET.  I personally reviewed and went over laboratory results with the patient.  The results are noted within this dictation.  Smoking cessation education is provided today.  He notes that he is down to < 1 ppd (previously smoking 2 + ppd prior to Dx).  Recently, his PPI was changed to Nexium (from Prilosec) and he is on BID dosing.  He notes minimal, if any, improvement.  I have asked him to add Zantac to this treatment.  He continues to report minimal improvement in this symptom.  I suspect this is esophagitis from radiation therapy.  Labs in 3-4 weeks: CBC diff, CMET.  I have refilled his Zofran and [place] Mouthwash.  He is to continue Mucinex with increased H2O intake for an expectorant.  Order placed for restaging tests: CT CAP with contrast and this is scheduled for 11/15/2016.  Return as scheduled for follow-up post-imaging.

## 2016-10-19 NOTE — Progress Notes (Signed)
Erik Hampshire, MD [place] [address]  Adenocarcinoma of right lung [facility]) - Plan: CBC with Differential, Comprehensive metabolic panel, ondansetron (ZOFRAN) 8 MG tablet, magic mouthwash w/lidocaine SOLN  Radiation-induced esophagitis - Plan: magic mouthwash w/lidocaine SOLN  CURRENT THERAPY: Cisplatin/Etoposide with XRT  INTERVAL HISTORY: Erik [name] 55 y.o. 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    Adenocarcinoma of right lung ([place])   07/01/2016 PET scan    6.3 x 3.7 cm right lower lobe mass, suspicious for primary bronchogenic neoplasm, as described above. Hypermetabolic thoracic nodal metastases, as above. Additional right perihilar hypermetabolism, indeterminate. Associated right middle lobe atelectasis/collapse.      07/19/2016 Procedure    Bronchoscopy with brushings and biopsies and endobronchial ultrasound with mediastinal lymph node aspirations by Dr. Roxan Hockey      07/21/2016 Pathology Results    Lung, biopsy, Right Middle Lobe - LUNG TISSUE WITH SQUAMOUS METAPLASIA. - NO MALIGNANCY IDENTIFIED.      07/21/2016 Pathology Results    FINE NEEDLE ASPIRATION, ENDOSCOPIC (A) LEVEL 7 (SPECIMEN 1 OF 3, COLLECTED ON 07/19/16): MALIGNANT CELLS CONSISTENT WITH ADENOCARCINOMA.      07/21/2016 Pathology Results    FINE NEEDLE ASPIRATION, EBUS, 4R, B (SPECIMEN 2 OF 3, COLLECTED 07/19/16): MALIGNANT CELLS CONSISTENT WITH ADENOCARCINOMA.      07/21/2016 Pathology Results    FINE NEEDLE ASPIRATION, EBUS, BRUSHING, RIGHT MIDDLE LOBE, D (SPECIMEN 3 OF 3, COLLECTED 07/19/16): MALIGNANT CELLS CONSISTENT WITH ADENOCARCINOMA.      07/22/2016 Imaging    MRI brain- No evidence of intracranial metastases.      08/17/2016 - 09/20/2016 Chemotherapy    The patient had palonosetron (ALOXI) injection 0.25 mg, 0.25 mg, Intravenous,  Once, 1 of 1 cycle  CISplatin (PLATINOL) 123 mg in sodium chloride 0.9 % 500 mL chemo  infusion, 50 mg/m2 = 123 mg, Intravenous,  Once, 1 of 1 cycle  etoposide (VEPESID) 120 mg in sodium chloride 0.9 % 500 mL chemo infusion, 50 mg/m2 = 120 mg, Intravenous,  Once, 1 of 1 cycle  fosaprepitant (EMEND) 150 mg, dexamethasone (DECADRON) 12 mg in sodium chloride 0.9 % 145 mL IVPB, , Intravenous,  Once, 1 of 1 cycle  for chemotherapy treatment.        08/17/2016 -  Radiation Therapy         He is recovering from treatment.  He notes fatigue and tiredness.    He reports intermittent nausea in AM with good treatment with PO Zofran at home.  He denies any vomiting.    He continues to suffer from "heartburn."  He is using Nexium BID and Zantac with minimal improvement.  I suspect this is radiation-induced esophagitis.  He continues to cough clear/yellow sputum.  He denies any fevers or chills.  He denies any nasal congestion or phlegm.  He denies a sore throat.  Overall, he reports improvement since finishing treatment.  Review of Systems  Constitutional: Positive for malaise/fatigue and weight loss. Negative for chills and fever.  HENT: Negative.   Eyes: Negative.   Respiratory: Positive for cough, sputum production and wheezing. Negative for shortness of breath.   Cardiovascular: Positive for chest pain.  Gastrointestinal: Positive for heartburn. Negative for constipation, diarrhea, nausea and vomiting.  Genitourinary: Negative.  Negative for dysuria.  Musculoskeletal: Negative.   Skin: Positive for rash.  Neurological: Positive for weakness.  Endo/Heme/Allergies: Negative.   Psychiatric/Behavioral: Negative.     Past Medical History:  Diagnosis Date  . Arnold-Chiari syndrome ([place])   . Arthritis   . Chronic back pain   . Depression   . GERD (gastroesophageal reflux disease)   . Seasonal allergies   . Spinal stenosis of lumbar region   . 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 Wheezing     Past Surgical History:  Procedure Laterality Date  .  BACK SURGERY    . MULTIPLE EXTRACTIONS WITH ALVEOLOPLASTY N/A 07/08/2014   Procedure: MULTIPLE EXTRACION WITH ALVEOLOPLASTY with EXCISION LESION RIGHT SIDE OF TONGUE;  Surgeon: Gae Bon, DDS;  Location: [place];  Service: Oral Surgery;  Laterality: N/A;  . PORTACATH PLACEMENT Right 07/30/2016   Procedure: INSERTION PORT-A-CATH;  Surgeon: Vickie Epley, MD;  Location: [facility];  Service: Vascular;  Laterality: Right;  [name] VIDEO BRONCHOSCOPY WITH ENDOBRONCHIAL ULTRASOUND N/A 07/19/2016   Procedure: VIDEO BRONCHOSCOPY WITH ENDOBRONCHIAL ULTRASOUND;  Surgeon: [name], MD;  Location[facility];  Service: Thoracic;  Laterality: N/A;    Family History  Problem Relation Age of Onset  . Cancer Mother     Social History   Social History  . Marital status: Divorced    Spouse name: N/A  . Number of children: N/A  . Years of education: N/A   Social History Main Topics  . Smoking status: Current Every Day Smoker    Packs/day: 2.00    Years: 38.00    Types: Cigarettes  . Smokeless tobacco: Former Systems developer    Types: Snuff     Comment: using chantix- smoking once in a while  . Alcohol use 1.8 oz/week    3 Cans of beer per week     Comment: reports drinking 2-3 beers a week  . Drug use:     Types: Hydrocodone  . Sexual activity: Not Currently   Other Topics Concern  . None   Social History Narrative  . None     PHYSICAL EXAMINATION  ECOG PERFORMANCE STATUS: 1 - Symptomatic but completely ambulatory  Vitals:   10/19/16 0935  BP: 113/87  Pulse: (!) 102  Resp: 18  Temp: 99.1 F (37.3 C)    GENERAL:alert, no distress, well nourished, well developed, comfortable, cooperative, obese, smiling and unaccompanied SKIN: skin color, texture, turgor are normal, no rashes or significant lesions HEAD: Normocephalic, No masses, lesions, tenderness or abnormalities EYES: normal, EOMI, Conjunctiva are pink and non-injected EARS: External ears normal OROPHARYNX:lips, buccal mucosa, and  tongue normal and mucous membranes are moist  NECK: supple, no adenopathy, trachea midline LYMPH:  no palpable lymphadenopathy BREAST:not examined LUNGS: positive findings: mild rhonchi  diffusely HEART: regular rate & rhythm, no murmurs and no gallops ABDOMEN:abdomen soft, obese and normal bowel sounds BACK: Back symmetric, no curvature. Erythema of back in the thoracic region, paraspinal. EXTREMITIES:less then 2 second capillary refill, no joint deformities, effusion, or inflammation, no skin discoloration, no cyanosis  NEURO: alert & oriented x 3 with fluent speech, no focal motor/sensory deficits, gait normal   LABORATORY DATA: CBC    Component Value Date/Time   WBC 6.6 09/20/2016 0947   RBC 4.03 (L) 09/20/2016 0947   HGB 13.3 09/20/2016 0947   HCT 39.1 09/20/2016 0947   PLT 138 (L) 09/20/2016 0947   MCV 97.0 09/20/2016 0947   MCH 33.0 09/20/2016 0947   MCHC 34.0 09/20/2016 0947   RDW 14.6 09/20/2016 0947   LYMPHSABS 0.3 (L) 09/20/2016 0947   MONOABS 0.1 09/20/2016 0947   EOSABS 0.0 09/20/2016 0947  BASOSABS 0.0 09/20/2016 0947      Chemistry      Component Value Date/Time   NA 136 09/20/2016 0947   K 4.0 09/20/2016 0947   CL 98 (L) 09/20/2016 0947   CO2 30 09/20/2016 0947   BUN 20 09/20/2016 0947   CREATININE 0.66 09/20/2016 0947      Component Value Date/Time   CALCIUM 9.2 09/20/2016 0947   ALKPHOS 80 09/20/2016 0947   AST 26 09/20/2016 0947   ALT 29 09/20/2016 0947   BILITOT 1.2 09/20/2016 0947        PENDING LABS:   RADIOGRAPHIC STUDIES:  No results found.   PATHOLOGY:    ASSESSMENT AND PLAN:  Adenocarcinoma of right lung ([facility]) Stage IIIA (T3N2M0) adenocarcinoma of right lung.  S/P biopsy by Dr. Roxan Hockey on 07/19/2016 proving Stage III disease.  Concomitant chemoXRT with Cisplatin/Etoposide finishing on 09/20/2016 with curative intent.  Oncology history is updated.    Pre-treatment labs today: CBC diff, CMET.  I personally reviewed and  went over laboratory results with the patient.  The results are noted within this dictation.  Smoking cessation education is provided today.  He notes that he is down to < 1 ppd (previously smoking 2 + ppd prior to Dx).  Recently, his PPI was changed to Nexium (from Prilosec) and he is on BID dosing.  He notes minimal, if any, improvement.  I have asked him to add Zantac to this treatment.  He continues to report minimal improvement in this symptom.  I suspect this is esophagitis from radiation therapy.  Labs in 3-4 weeks: CBC diff, CMET.  I have refilled his Zofran and Dukes Mouthwash.  He is to continue Mucinex with increased H2O intake for an expectorant.  Order placed for restaging tests: CT CAP with contrast and this is scheduled for 11/15/2016.  Return as scheduled for follow-up post-imaging.   ORDERS PLACED FOR THIS ENCOUNTER: Orders Placed This Encounter  Procedures  . CBC with Differential  . Comprehensive metabolic panel    MEDICATIONS PRESCRIBED THIS ENCOUNTER: Meds ordered this encounter  Medications  . nystatin (MYCOSTATIN) 100000 UNIT/ML suspension  . ondansetron (ZOFRAN) 8 MG tablet    Sig: Take 1 tablet (8 mg total) by mouth every 8 (eight) hours as needed for nausea or vomiting.    Dispense:  30 tablet    Refill:  2    Order Specific Question:   Supervising Provider    Answer:   Patrici Ranks [id number]  . magic mouthwash w/lidocaine SOLN    Sig: 1 part of each: Diphenhydramine 12.5 mg /5 mL, Viscous lidocaine 2% and Maalox. Swish and swallow 5 mL Q 4 hours.    Dispense:  240 mL    Refill:  1    Order Specific Question:   Supervising Provider    Answer:   Patrici Ranks [id number]    THERAPY PLAN:  Restaging imaging as scheduled to evaluate response to treatment.  All questions were answered. The patient knows to call the clinic with any problems, questions or concerns. We can certainly see the patient much sooner if necessary.  Patient and plan  discussed with Dr. Ancil Linsey and she is in agreement with the aforementioned.   This note is electronically signed by: Robynn Pane, PA-C 10/19/2016 10:06 AM

## 2016-11-01 ENCOUNTER — Other Ambulatory Visit (HOSPITAL_COMMUNITY): Payer: Self-pay | Admitting: Oncology

## 2016-11-01 ENCOUNTER — Other Ambulatory Visit (HOSPITAL_COMMUNITY): Payer: Self-pay | Admitting: Hematology & Oncology

## 2016-11-01 DIAGNOSIS — K219 Gastro-esophageal reflux disease without esophagitis: Secondary | ICD-10-CM

## 2016-11-01 DIAGNOSIS — C3491 Malignant neoplasm of unspecified part of right bronchus or lung: Secondary | ICD-10-CM

## 2016-11-03 ENCOUNTER — Other Ambulatory Visit (HOSPITAL_COMMUNITY): Payer: Self-pay | Admitting: Radiation Oncology

## 2016-11-03 DIAGNOSIS — C3431 Malignant neoplasm of lower lobe, right bronchus or lung: Secondary | ICD-10-CM

## 2016-11-09 ENCOUNTER — Other Ambulatory Visit (HOSPITAL_COMMUNITY): Payer: Self-pay | Admitting: Emergency Medicine

## 2016-11-12 ENCOUNTER — Other Ambulatory Visit: Payer: Self-pay | Admitting: Radiation Oncology

## 2016-11-12 DIAGNOSIS — C3431 Malignant neoplasm of lower lobe, right bronchus or lung: Secondary | ICD-10-CM

## 2016-11-14 ENCOUNTER — Encounter (HOSPITAL_COMMUNITY): Payer: Self-pay

## 2016-11-15 ENCOUNTER — Ambulatory Visit (HOSPITAL_COMMUNITY)
Admission: RE | Admit: 2016-11-15 | Discharge: 2016-11-15 | Disposition: A | Discharge status: Home / Self Care | Payer: Medicare Other | Source: Ambulatory Visit | Attending: Oncology | Admitting: Oncology

## 2016-11-15 DIAGNOSIS — J9811 Atelectasis: Secondary | ICD-10-CM | POA: Insufficient documentation

## 2016-11-15 DIAGNOSIS — C3491 Malignant neoplasm of unspecified part of right bronchus or lung: Secondary | ICD-10-CM | POA: Insufficient documentation

## 2016-11-15 DIAGNOSIS — R590 Localized enlarged lymph nodes: Secondary | ICD-10-CM | POA: Diagnosis not present

## 2016-11-15 LAB — POCT I-STAT CREATININE: Creatinine, Ser: 0.6 mg/dL — ABNORMAL LOW (ref 0.61–1.24)

## 2016-11-15 MED ORDER — IOPAMIDOL (ISOVUE-300) INJECTION 61%
100.0000 mL | Freq: Once | INTRAVENOUS | Status: AC | PRN
  Administered 2016-11-15: 100 mL via INTRAVENOUS

## 2016-11-16 ENCOUNTER — Encounter (HOSPITAL_COMMUNITY): Payer: Medicare Other | Attending: Hematology | Admitting: Hematology & Oncology

## 2016-11-16 ENCOUNTER — Encounter (HOSPITAL_COMMUNITY): Payer: Self-pay | Admitting: Hematology & Oncology

## 2016-11-16 ENCOUNTER — Encounter (HOSPITAL_BASED_OUTPATIENT_CLINIC_OR_DEPARTMENT_OTHER): Payer: Medicare Other

## 2016-11-16 VITALS — BP 151/74 | HR 94 | Temp 98.0°F | Resp 16 | Wt 238.2 lb

## 2016-11-16 DIAGNOSIS — C3431 Malignant neoplasm of lower lobe, right bronchus or lung: Secondary | ICD-10-CM

## 2016-11-16 DIAGNOSIS — D7589 Other specified diseases of blood and blood-forming organs: Secondary | ICD-10-CM | POA: Diagnosis not present

## 2016-11-16 DIAGNOSIS — R918 Other nonspecific abnormal finding of lung field: Secondary | ICD-10-CM

## 2016-11-16 DIAGNOSIS — R5383 Other fatigue: Secondary | ICD-10-CM

## 2016-11-16 DIAGNOSIS — R12 Heartburn: Secondary | ICD-10-CM | POA: Diagnosis not present

## 2016-11-16 DIAGNOSIS — R112 Nausea with vomiting, unspecified: Secondary | ICD-10-CM

## 2016-11-16 DIAGNOSIS — R110 Nausea: Secondary | ICD-10-CM | POA: Diagnosis not present

## 2016-11-16 DIAGNOSIS — Z452 Encounter for adjustment and management of vascular access device: Secondary | ICD-10-CM

## 2016-11-16 DIAGNOSIS — D751 Secondary polycythemia: Secondary | ICD-10-CM | POA: Diagnosis not present

## 2016-11-16 DIAGNOSIS — C3491 Malignant neoplasm of unspecified part of right bronchus or lung: Secondary | ICD-10-CM | POA: Insufficient documentation

## 2016-11-16 DIAGNOSIS — F17200 Nicotine dependence, unspecified, uncomplicated: Secondary | ICD-10-CM

## 2016-11-16 LAB — CBC WITH DIFFERENTIAL/PLATELET
Basophils Absolute: 0 10*3/uL (ref 0.0–0.1)
Basophils Relative: 0 %
EOS ABS: 0.1 10*3/uL (ref 0.0–0.7)
Eosinophils Relative: 2 %
HEMATOCRIT: 48.8 % (ref 39.0–52.0)
HEMOGLOBIN: 13.5 g/dL (ref 13.0–17.0)
LYMPHS PCT: 13 %
Lymphs Abs: 0.9 10*3/uL (ref 0.7–4.0)
MCH: 32.5 pg (ref 26.0–34.0)
MCHC: 27.7 g/dL — ABNORMAL LOW (ref 30.0–36.0)
MCV: 117.3 fL — ABNORMAL HIGH (ref 78.0–100.0)
MONO ABS: 0.5 10*3/uL (ref 0.1–1.0)
Monocytes Relative: 8 %
NEUTROS PCT: 77 %
Neutro Abs: 5.3 10*3/uL (ref 1.7–7.7)
Platelets: 147 10*3/uL — ABNORMAL LOW (ref 150–400)
RBC: 4.16 MIL/uL — AB (ref 4.22–5.81)
RDW: 16.3 % — ABNORMAL HIGH (ref 11.5–15.5)
WBC: 6.8 10*3/uL (ref 4.0–10.5)

## 2016-11-16 LAB — COMPREHENSIVE METABOLIC PANEL
ALBUMIN: 3.5 g/dL (ref 3.5–5.0)
ALK PHOS: 119 U/L (ref 38–126)
ALT: 24 U/L (ref 17–63)
AST: 31 U/L (ref 15–41)
Anion gap: 10 (ref 5–15)
BUN: 7 mg/dL (ref 6–20)
CALCIUM: 9.2 mg/dL (ref 8.9–10.3)
CO2: 27 mmol/L (ref 22–32)
CREATININE: 0.65 mg/dL (ref 0.61–1.24)
Chloride: 98 mmol/L — ABNORMAL LOW (ref 101–111)
GFR calc Af Amer: >60 mL/min (ref >60)
GFR calc non Af Amer: >60 mL/min (ref >60)
GLUCOSE: 168 mg/dL — AB (ref 65–99)
Potassium: 3.5 mmol/L (ref 3.5–5.1)
SODIUM: 135 mmol/L (ref 135–145)
Total Bilirubin: 0.5 mg/dL (ref 0.3–1.2)
Total Protein: 7.8 g/dL (ref 6.5–8.1)

## 2016-11-16 MED ORDER — HEPARIN SOD (PORK) LOCK FLUSH 100 UNIT/ML IV SOLN
INTRAVENOUS | Status: AC
  Filled 2016-11-16: qty 5

## 2016-11-16 MED ORDER — HEPARIN SOD (PORK) LOCK FLUSH 100 UNIT/ML IV SOLN
500.0000 [IU] | Freq: Once | INTRAVENOUS | Status: AC
  Administered 2016-11-16: 500 [IU] via INTRAVENOUS

## 2016-11-16 MED ORDER — SODIUM CHLORIDE 0.9% FLUSH
20.0000 mL | INTRAVENOUS | Status: DC | PRN
  Administered 2016-11-16: 20 mL via INTRAVENOUS
  Filled 2016-11-16: qty 20

## 2016-11-16 MED ORDER — AZITHROMYCIN 250 MG PO TABS: ORAL_TABLET | ORAL | 0 refills | Status: DC

## 2016-11-16 MED ORDER — ONDANSETRON HCL 8 MG PO TABS: 8.0000 mg | ORAL_TABLET | Freq: Three times a day (TID) | ORAL | 2 refills | Status: DC | PRN

## 2016-11-16 NOTE — Patient Instructions (Addendum)
[place] at [facility] Discharge Instructions  RECOMMENDATIONS MADE BY THE CONSULTANT AND ANY TEST RESULTS WILL BE SENT TO YOUR REFERRING PHYSICIAN.  You saw Dr.Penland today. Labs and port flush today. Start Durvalumab after teaching. Refer to Dr. [name] for weight loss and persistent GERD. Follow up after starting Durvalumab. See [name] at checkout for appointments.  Thank you for choosing [place] at [facility] to provide your oncology and hematology care.  To afford each patient quality time with our provider, please arrive at least 15 minutes before your scheduled appointment time.   Beginning January 23rd 2017 lab work for the [facility] will be done in the  Main lab at [facility] on 1st floor. If you have a lab appointment with the [facility] please come in thru the  Main Entrance and check in at the main information desk  You need to re-schedule your appointment should you arrive 10 or more minutes late.  We strive to give you quality time with our providers, and arriving late affects you and other patients whose appointments are after yours.  Also, if you no show three or more times for appointments you may be dismissed from the clinic at the providers discretion.     Again, thank you for choosing [facility].  Our hope is that these requests will decrease the amount of time that you wait before being seen by our physicians.       _____________________________________________________________  Should you have questions after your visit to [facility], please contact our office at [phone] between the hours of 8:30 a.m. and 4:30 p.m.  Voicemails left after 4:30 p.m. will not be returned until the following business day.  For prescription refill requests, have your pharmacy contact our office.         Resources For Cancer Patients and their Caregivers ? American Cancer Society: Can assist with  transportation, wigs, general needs, runs Look Good Feel Better.        680-274-3889 ? Cancer Care: Provides financial assistance, online support groups, medication/co-pay assistance.  1-800-813-HOPE 316-703-8725) ? [place] Assists [place] cancer patients and their families through emotional , educational and financial support.  808-361-4823 ? [place] DSS Where to apply for food stamps, Medicaid and utility assistance. 805-553-2595 ? [facility]: Transportation to medical appointments. (309)467-3637 ? Social Security Administration: May apply for disability if have a Stage IV cancer. 250-208-4949 312 267 3420 ? [employer], Disability and Transit Services: Assists with nutrition, care and transit needs. [place] Support Programs: '@10RELATIVEDAYS'$ @ > Cancer Support Group  2nd Tuesday of the month 1pm-2pm, Journey Room  > Creative Journey  3rd Tuesday of the month 1130am-1pm, Journey Room  > Look Good Feel Better  1st Wednesday of the month 10am-12 noon, Journey Room (Call [place] to register [phone])

## 2016-11-16 NOTE — Progress Notes (Signed)
START ON PATHWAY REGIMEN - Non-Small Cell Lung  LOS23: Carboplatin AUC=6 + Paclitaxel 200 mg/m2 q21 Days x 2-3 Cycles   A cycle is every 21 days:     Paclitaxel (Taxol(R)) 200 mg/m2 in 500 mL NS IV over 3 hours followed by Dose Mod: None     Carboplatin (Paraplatin(R)) AUC=6 in 250 mL NS IV over 1 hour Dose Mod: None Additional Orders: * All AUC calculations intended to be used in [employer] formula  **Always confirm dose/schedule in your pharmacy ordering system**    Patient Characteristics: Stage III - Unresectable, PS = 0, 1 Check here if patient was staged using an edition prior to AJCC Staging - 8th Edition (i.e., prior to December 06, 2016)? false AJCC T Category: T2b Current Disease Status: No Distant Mets or Local Recurrence AJCC N Category: N2 AJCC M Category: M0 AJCC 8 Stage Grouping: IIIA Performance Status: PS = 0, 1  Intent of Therapy: Curative Intent, Discussed with Patient

## 2016-11-16 NOTE — Patient Instructions (Signed)
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  You finished treatment about 6 weeks ago and have had a good response to treatment. The new recommendations for following treatment is with immunotherapy for up to 12 months.  We are going to treat you with durvalumab.  You will see the doctor regularly throughout treatment.  We monitor your lab work prior to every treatment.  The doctor monitors your response to treatment by the way you are feeling, your blood work, and scans periodically.  POTENTIAL SIDE EFFECTS OF TREATMENT:       EDUCATIONAL MATERIALS GIVEN AND REVIEWED: Chemotherapy and you book given.  Nutrition book given.  Information on durvalumab.    SELF CARE ACTIVITIES WHILE ON CHEMOTHERAPY: Hydration Increase your fluid intake 48 hours prior to treatment and drink at least 8 to 12 cups (64 ounces) of water/decaff beverages per day after treatment. You can still have your cup of coffee or soda but these beverages do not count as part of your 8 to 12 cups that you need to drink daily. No alcohol intake.  Medications Continue taking your normal prescription medication as prescribed.  If you start any new herbal or new supplements please let us know first to make sure it is safe.  Mouth Care Have teeth cleaned professionally before starting treatment. Keep dentures and partial plates clean. Use soft toothbrush and do not use mouthwashes that contain alcohol. Biotene is a good mouthwash that is available at most pharmacies or may be ordered by calling [phone]. Use warm salt water gargles (1 teaspoon salt per 1 quart warm water) before and after meals and at bedtime. Or you may rinse with 2 tablespoons of three-percent hydrogen peroxide mixed in eight ounces of water. If you are still having problems with your mouth or sores in your mouth please call the clinic. If you need dental work, please let Dr.  Whitney Muse know before you go for your appointment so that we can coordinate the best possible time for you in regards to your chemo regimen. You need to also let your dentist know that you are actively taking chemo. We may need to do labs prior to your dental appointment.   Skin Care Always use sunscreen that has not expired and with SPF (Sun Protection Factor) of 50 or higher. Wear hats to protect your head from the sun. Remember to use sunscreen on your hands, ears, face, & feet.  Use good moisturizing lotions such as udder cream, eucerin, or even Vaseline. Some chemotherapies can cause dry skin, color changes in your skin and nails.    . Avoid long, hot showers or baths. . Use gentle, fragrance-free soaps and laundry detergent. . Use moisturizers, preferably creams or ointments rather than lotions because the thicker consistency is better at preventing skin dehydration. Apply the cream or ointment within 15 minutes of showering. Reapply moisturizer at night, and moisturize your hands every time after you wash them.  Hair Loss (if your doctor says your hair will fall out)  . If your doctor says that your hair is likely to fall out, decide before you begin chemo whether you want to wear a wig. You may want to shop before treatment to match your hair color. . Hats, turbans, and scarves can also camouflage hair loss, although some people prefer to leave their heads uncovered. If you go bare-headed outdoors, be sure to use sunscreen on your scalp. [name]  Cut your hair short. It eases the inconvenience of shedding lots of hair, but it also can reduce the emotional impact of watching your hair fall out. . Don't perm or color your hair during chemotherapy. Those chemical treatments are already damaging to hair and can enhance hair loss. Once your chemo treatments are done and your hair has grown back, it's OK to resume dyeing or perming hair. With chemotherapy, hair loss is almost always temporary. But when it  grows back, it may be a different color or texture. In older adults who still had hair color before chemotherapy, the new growth may be completely gray.  Often, new hair is very fine and soft.  Infection Prevention Please wash your hands for at least 30 seconds using warm soapy water. Handwashing is the #1 way to prevent the spread of germs. Stay away from sick people or people who are getting over a cold. If you develop respiratory systems such as green/yellow mucus production or productive cough or persistent cough let us know and we will see if you need an antibiotic. It is a good idea to keep a pair of gloves on when going into grocery stores/[employer] to decrease your risk of coming into contact with germs on the carts, etc. Carry alcohol hand gel with you at all times and use it frequently if out in public. If your temperature reaches 100.5 or higher please call the clinic and let us know.  If it is after hours or on the weekend please go to the ER if your temperature is over 100.5.  Please have your own personal thermometer at home to use.    Sex and bodily fluids If you are going to have sex, a condom must be used to protect the person that isn't taking chemotherapy. Chemo can decrease your libido (sex drive). For a few days after chemotherapy, chemotherapy can be excreted through your bodily fluids.  When using the toilet please close the lid and flush the toilet twice.  Do this for a few day after you have had chemotherapy.     Effects of chemotherapy on your sex life Some changes are simple and won't last long. They won't affect your sex life permanently. Sometimes you may feel: . too tired . not strong enough to be very active . sick or sore  . not in the mood . anxious or low Your anxiety might not seem related to sex. For example, you may be worried about the cancer and how your treatment is going. Or you may be worried about money, or about how you family are coping with your  illness. These things can cause stress, which can affect your interest in sex. It's important to talk to your partner about how you feel. Remember - the changes to your sex life don't usually last long. There's usually no medical reason to stop having sex during chemo. The drugs won't have any long term physical effects on your performance or enjoyment of sex. Cancer can't be passed on to your partner during sex  Contraception It's important to use reliable contraception during treatment. Avoid getting pregnant while you or your partner are having chemotherapy. This is because the drugs may harm the baby. Sometimes chemotherapy drugs can leave a man or woman infertile.  This means you would not be able to have children in the future. You might want to talk to someone about permanent infertility. It can be very difficult to learn that you may no longer be able  to have children. Some people find counselling helpful. There might be ways to preserve your fertility, although this is easier for men than for women. You may want to speak to a fertility expert. You can talk about sperm banking or harvesting your eggs. You can also ask about other fertility options, such as donor eggs. If you have or have had breast cancer, your doctor might advise you not to take the contraceptive pill. This is because the hormones in it might affect the cancer.  It is not known for sure whether or not chemotherapy drugs can be passed on through semen or secretions from the vagina. Because of this some doctors advise people to use a barrier method if you have sex during treatment. This applies to vaginal, anal or oral sex. Generally, doctors advise a barrier method only for the time you are actually having the treatment and for about a week after your treatment. Advice like this can be worrying, but this does not mean that you have to avoid being intimate with your partner. You can still have close contact with your partner and  continue to enjoy sex.  Animals If you have cats or birds we just ask that you not change the litter or change the cage.  Please have someone else do this for you while you are on chemotherapy.   Food Safety During and After Cancer Treatment Food safety is important for people both during and after cancer treatment. Cancer and cancer treatments, such as chemotherapy, radiation therapy, and stem cell/bone marrow transplantation, often weaken the immune system. This makes it harder for your body to protect itself from foodborne illness, also called food poisoning. Foodborne illness is caused by eating food that contains harmful bacteria, parasites, or viruses.  Foods to avoid Some foods have a higher risk of becoming tainted with bacteria. These include: [name] Unwashed fresh fruit and vegetables, especially leafy vegetables that can hide dirt and other contaminants . Raw sprouts, such as alfalfa sprouts . Raw or undercooked beef, especially ground beef, or other raw or undercooked meat and poultry . Fatty, fried, or spicy foods immediately before or after treatment.  These can sit heavy on your stomach and make you feel nauseous. . Raw or undercooked shellfish, such as oysters. . Sushi and sashimi, which often contain raw fish.  . Unpasteurized beverages, such as unpasteurized fruit juices, raw milk, raw yogurt, or cider . Undercooked eggs, such as soft boiled, over easy, and poached; raw, unpasteurized eggs; or foods made with raw egg, such as homemade raw cookie dough and homemade mayonnaise Simple steps for food safety Shop smart. . Do not buy food stored or displayed in an unclean area. . Do not buy bruised or damaged fruits or vegetables. . Do not buy cans that have cracks, dents, or bulges. . Pick up foods that can spoil at the end of your shopping trip and store them in a cooler on the way home. Prepare and clean up foods carefully. . Rinse all fresh fruits and vegetables under running  water, and dry them with a clean towel or paper towel. . Clean the top of cans before opening them. . After preparing food, wash your hands for 20 seconds with hot water and soap. Pay special attention to areas between fingers and under nails. . Clean your utensils and dishes with hot water and soap. [name] Disinfect your kitchen and cutting boards using 1 teaspoon of liquid, unscented bleach mixed into 1 quart of water.  Dispose of old food. . Eat canned and packaged food before its expiration date (the "use by" or "best before" date). . Consume refrigerated leftovers within 3 to 4 days. After that time, throw out the food. Even if the food does not smell or look spoiled, it still may be unsafe. Some bacteria, such as Listeria, can grow even on foods stored in the refrigerator if they are kept for too long. Take precautions when eating out. . At restaurants, avoid buffets and salad bars where food sits out for a long time and comes in contact with many people. Food can become contaminated when someone with a virus, often a norovirus, or another "bug" handles it. . Put any leftover food in a "to-go" container yourself, rather than having the server do it. And, refrigerate leftovers as soon as you get home. . Choose restaurants that are clean and that are willing to prepare your food as you order it cooked.    MEDICATIONS:                                                                                                                                                              Zofran/Ondansetron '8mg'$  tablet. Take 1 tablet every 8 hours as needed for nausea/vomiting. (#1 nausea med to take, this can constipate)  Compazine/Prochlorperazine '10mg'$  tablet. Take 1 tablet every 6 hours as needed for nausea/vomiting. (#2 nausea med to take, this can make you sleepy)   EMLA cream. Apply a quarter size amount to port site 1 hour prior to chemo. Do not rub in. Cover with plastic wrap.   Over-the-Counter  Meds:  Miralax 1 capful in 8 oz of fluid daily. May increase to two times a day if needed. This is a stool softener. If this doesn't work proceed you can add:  Senokot S-start with 1 tablet two times a day and increase to 4 tablets two times a day if needed. (total of 8 tablets in a 24 hour period). This is a stimulant laxative.   Call us if this does not help your bowels move.   Imodium '2mg'$  capsule. Take 2 capsules after the 1st loose stool and then 1 capsule every 2 hours until you go a total of 12 hours without having a loose stool. Call the [facility] if loose stools continue. If diarrhea occurs @ bedtime, take 2 capsules @ bedtime. Then take 2 capsules every 4 hours until morning. Call [facility].     Diarrhea Sheet  If you are having loose stools/diarrhea, please purchase Imodium and begin taking as outlined:  At the first sign of poorly formed or loose stools you should begin taking Imodium(loperamide) 2 mg capsules.  Take two caplets ('4mg'$ ) followed by one caplet ('2mg'$ ) every 2 hours until you have had no diarrhea  for 12 hours.  During the night take two caplets ('4mg'$ ) at bedtime and continue every 4 hours during the night until the morning.  Stop taking Imodium only after there is no sign of diarrhea for 12 hours.    Always call the [facility] if you are having loose stools/diarrhea that you can't get under control.  Loose stools/disrrhea leads to dehydration (loss of water) in your body.  We have other options of trying to get the loose stools/diarrhea to stopped but you must let us know!    Constipation Sheet *Miralax in 8 oz of fluid daily.  May increase to two times a day if needed.  This is a stool softener.  If this not enough to keep your bowel regular:  You can add:  *Senokot S, start with one tablet twice a day and can increase to 4 tablets twice a day if needed.  This is a stimulant laxative.   Sometimes when you take pain medication you need BOTH a medicine to  keep your stool soft and a medicine to help your bowel push it out!  Please call if the above does not work for you.   Do not go more than 2 days without a bowel movement.  It is very important that you do not become constipated.  It will make you feel sick to your stomach (nausea) and can cause abdominal pain and vomiting.     Nausea Sheet  Zofran/Ondansetron '8mg'$  tablet. Take 1 tablet every 8 hours as needed for nausea/vomiting. (#1 nausea med to take, this can constipate)  Compazine/Prochlorperazine '10mg'$  tablet. Take 1 tablet every 6 hours as needed for nausea/vomiting. (#2 nausea med to take, this can make you sleepy)  You can take these medications together or separately.  We would first like for you to try the Ondansetron by itself and then take the Prochloperizine if needed. But you are allowed to take both medications at the same time if your nausea is that severe.  If you are having persistent nausea (nausea that does not stop) please take these medications on a staggered schedule so that the nausea medication stays in your body.  Please call the [facility] and let us know the amount of nausea that you are experiencing.  If you begin to vomit, you need to call the [facility] and if it is the weekend and you have vomited more than one time and cant get it to stop-go to the Emergency Room.  Persistent nausea/vomiting can lead to dehydration (loss of fluid in your body) and will make you feel terrible.   Ice chips, sips of clear liquids, foods that are @ room temperature, crackers, and toast tend to be better tolerated.    SYMPTOMS TO REPORT AS SOON AS POSSIBLE AFTER TREATMENT:  FEVER GREATER THAN 100.5 F  CHILLS WITH OR WITHOUT FEVER  NAUSEA AND VOMITING THAT IS NOT CONTROLLED WITH YOUR NAUSEA MEDICATION  UNUSUAL SHORTNESS OF BREATH  UNUSUAL BRUISING OR BLEEDING  TENDERNESS IN MOUTH AND THROAT WITH OR WITHOUT PRESENCE OF ULCERS  URINARY PROBLEMS  BOWEL  PROBLEMS  UNUSUAL RASH    Wear comfortable clothing and clothing appropriate for easy access to any Portacath or PICC line. Let us know if there is anything that we can do to make your therapy better!    What to do if you need assistance after hours or on the weekends: CALL [phone].  HOLD on the line, do not hang up.  You will hear multiple  messages but at the end you will be connected with a nurse triage line.  They will contact Dr Whitney Muse if necessary.  Most of the time they will be able to assist you.   Do not call the hospital operator.  Dr Whitney Muse will not answer phone calls received by them.     I have been informed and understand all of the instructions given to me and have received a copy. I have been instructed to call the clinic [phone] or my family physician as soon as possible for continued medical care, if indicated. I do not have any more questions at this time but understand that I may call the [facility] or the Patient Navigator at [phone] during office hours should I have questions or need assistance in obtaining follow-up care.

## 2016-11-16 NOTE — Progress Notes (Signed)
Erik [name] presented for Portacath access and flush. Proper placement of portacath confirmed by CXR. Portacath located right chest wall accessed with  H 20 needle. Good blood return present.  Specimen drawn for labs.   Portacath flushed with 50m NS and 500U/598mHeparin and needle removed intact. Procedure without incident. Patient tolerated procedure well.

## 2016-11-16 NOTE — Progress Notes (Signed)
START OFF PATHWAY REGIMEN - Non-Small Cell Lung  Off Pathway: Durvalumab 10 mg/kg q14 days  OFF11010:Durvalumab 10 mg/kg q14 days:   A cycle is every 14 days:     Durvalumab (Imfinzi(TM)) 10 mg/kg in 250 mL NS IV over 60 minutes on day 1 only of each cycle. Dose Mod: None Additional Orders: Monitor for infusion reactions; interrupt or slow infusion for grade 1 or 2 reaction occurs and consider premedication for subsequent infusion; discontinue for grade 3 or 4 infusion reaction.  See prescribing information for dose interruption guidelines and management of toxicity. Ref: [facility] [phone], 2016.  **Always confirm dose/schedule in your pharmacy ordering system**    Patient Characteristics: Stage III - Unresectable, PS = 0, 1 Check here if patient was staged using an edition prior to AJCC Staging - 8th Edition (i.e., prior to December 06, 2016)? false AJCC T Category: T2a Current Disease Status: No Distant Mets or Local Recurrence AJCC N Category: N2 AJCC M Category: M0 AJCC 8 Stage Grouping: IIIA Performance Status: PS = 0, 1  Intent of Therapy: Curative Intent, Discussed with Patient

## 2016-11-16 NOTE — Patient Instructions (Signed)
[place] at [facility] Discharge Instructions  RECOMMENDATIONS MADE BY THE CONSULTANT AND ANY TEST RESULTS WILL BE SENT TO YOUR REFERRING PHYSICIAN.  Port flush with labs today.    Thank you for choosing [place] at [facility] to provide your oncology and hematology care.  To afford each patient quality time with our provider, please arrive at least 15 minutes before your scheduled appointment time.   Beginning January 23rd 2017 lab work for the [facility] will be done in the  Main lab at [facility] on 1st floor. If you have a lab appointment with the [facility] please come in thru the  Main Entrance and check in at the main information desk  You need to re-schedule your appointment should you arrive 10 or more minutes late.  We strive to give you quality time with our providers, and arriving late affects you and other patients whose appointments are after yours.  Also, if you no show three or more times for appointments you may be dismissed from the clinic at the providers discretion.     Again, thank you for choosing [facility].  Our hope is that these requests will decrease the amount of time that you wait before being seen by our physicians.       _____________________________________________________________  Should you have questions after your visit to [facility], please contact our office at [phone] between the hours of 8:30 a.m. and 4:30 p.m.  Voicemails left after 4:30 p.m. will not be returned until the following business day.  For prescription refill requests, have your pharmacy contact our office.         Resources For Cancer Patients and their Caregivers ? American Cancer Society: Can assist with transportation, wigs, general needs, runs Look Good Feel Better.        224-814-8449 ? Cancer Care: Provides financial assistance, online support groups, medication/co-pay assistance.   1-800-813-HOPE 754-426-9084) ? [place] Assists [place] cancer patients and their families through emotional , educational and financial support.  707 243 5415 ? [place] DSS Where to apply for food stamps, Medicaid and utility assistance. 9171192072 ? [facility]: Transportation to medical appointments. 754-703-0186 ? Social Security Administration: May apply for disability if have a Stage IV cancer. ([phone] 661-444-7268 ? [employer], Disability and Transit Services: Assists with nutrition, care and transit needs. [place] Support Programs: '@10RELATIVEDAYS'$ @ > Cancer Support Group  2nd Tuesday of the month 1pm-2pm, Journey Room  > Creative Journey  3rd Tuesday of the month 1130am-1pm, Journey Room  > Look Good Feel Better  1st Wednesday of the month 10am-12 noon, Journey Room (Call [place] to register [phone])

## 2016-11-16 NOTE — Progress Notes (Signed)
Erik Hampshire, MD [address] [place] [address]  No diagnosis found.  CURRENT THERAPY: S/P concurrent CHEMO/XRT. Planning for consolidative immunotherapy   Adenocarcinoma of right lung ([place])   07/01/2016 PET scan    6.3 x 3.7 cm right lower lobe mass, suspicious for primary bronchogenic neoplasm, as described above. Hypermetabolic thoracic nodal metastases, as above. Additional right perihilar hypermetabolism, indeterminate. Associated right middle lobe atelectasis/collapse.      07/19/2016 Procedure    Bronchoscopy with brushings and biopsies and endobronchial ultrasound with mediastinal lymph node aspirations by Dr. Roxan Hockey      07/21/2016 Pathology Results    Lung, biopsy, Right Middle Lobe - LUNG TISSUE WITH SQUAMOUS METAPLASIA. - NO MALIGNANCY IDENTIFIED.      07/21/2016 Pathology Results    FINE NEEDLE ASPIRATION, ENDOSCOPIC (A) LEVEL 7 (SPECIMEN 1 OF 3, COLLECTED ON 07/19/16): MALIGNANT CELLS CONSISTENT WITH ADENOCARCINOMA.      07/21/2016 Pathology Results    FINE NEEDLE ASPIRATION, EBUS, 4R, B (SPECIMEN 2 OF 3, COLLECTED 07/19/16): MALIGNANT CELLS CONSISTENT WITH ADENOCARCINOMA.      07/21/2016 Pathology Results    FINE NEEDLE ASPIRATION, EBUS, BRUSHING, RIGHT MIDDLE LOBE, D (SPECIMEN 3 OF 3, COLLECTED 07/19/16): MALIGNANT CELLS CONSISTENT WITH ADENOCARCINOMA.      07/22/2016 Imaging    MRI brain- No evidence of intracranial metastases.      08/17/2016 - 09/20/2016 Chemotherapy    The patient had palonosetron (ALOXI) injection 0.25 mg, 0.25 mg, Intravenous,  Once, 1 of 1 cycle  CISplatin (PLATINOL) 123 mg in sodium chloride 0.9 % 500 mL chemo infusion, 50 mg/m2 = 123 mg, Intravenous,  Once, 1 of 1 cycle  etoposide (VEPESID) 120 mg in sodium chloride 0.9 % 500 mL chemo infusion, 50 mg/m2 = 120 mg, Intravenous,  Once, 1 of 1 cycle  fosaprepitant (EMEND) 150 mg, dexamethasone (DECADRON) 12 mg in sodium chloride 0.9 % 145 mL IVPB, , Intravenous,   Once, 1 of 1 cycle  for chemotherapy treatment.        08/17/2016 -  Radiation Therapy         11/15/2016 Imaging    CT CAP- Interval response to therapy. The previously demonstrated mediastinal and right hilar adenopathy and resulting right middle lobe atelectasis have all improved. 2. No discrete residual lung masses are identified. There is new multifocal ground-glass opacity within the right lower lobe, and to a lesser extent in the left upper lobe. These are probably inflammatory/treatment related. 3. No evidence of abdominopelvic metastatic disease. Stable prominent lymph nodes in the upper abdomen, likely reactive. 4. Decompressed mid SVC without specific signs of SVC occlusion.       INTERVAL HISTORY: Erik [name] 55 y.o. 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 He presents today to review recent imaging at the completion of therapy.   Patient is unaccompanied today. He completed therapy six weeks ago. Erik [name] states that his appetite has improved, but he still is not eating as much as he was in the past. Cecilia denies new pain; however, he has been fatigued.   Patient says he occasionally vomits in the morning. He still experiences heartburn, but states it has improved somewhat. He denies any significant nausea preceeding the vomiting he notes that it just happens. It typically does not occur again.   He also still has a cough, but it too has improved. No fever or chills. Breathing is baseline.   Patient needs a  refill for Zofran.  Review of Systems  Constitutional: Positive for malaise/fatigue. Negative for chills, fever and weight loss.  Eyes: Negative for blurred vision and double vision.  Respiratory: Positive for cough. Negative for hemoptysis.        Cough improved  Cardiovascular: Negative.  Negative for chest pain.  Gastrointestinal: Positive for heartburn, nausea and vomiting.       Vomiting and heartburn improved    Genitourinary: Negative.  Negative for dysuria.  Musculoskeletal: Negative.   Skin: Negative.   Neurological: Negative.  Negative for weakness and headaches.  Endo/Heme/Allergies: Negative.   14 point review of systems was performed and is negative except as detailed under history of present illness and above   Past Medical History:  Diagnosis Date  . Arnold-Chiari syndrome ([place])   . Arthritis   . Chronic back pain   . Depression   . GERD (gastroesophageal reflux disease)   . Seasonal allergies   . Spinal stenosis of lumbar region   . 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 Wheezing     Past Surgical History:  Procedure Laterality Date  . BACK SURGERY    . MULTIPLE EXTRACTIONS WITH ALVEOLOPLASTY N/A 07/08/2014   Procedure: MULTIPLE EXTRACION WITH ALVEOLOPLASTY with EXCISION LESION RIGHT SIDE OF TONGUE;  Surgeon: Gae Bon, DDS;  Location: [facility];  Service: Oral Surgery;  Laterality: N/A;  . PORTACATH PLACEMENT Right 07/30/2016   Procedure: INSERTION PORT-A-CATH;  Surgeon: Vickie Epley, MD;  Location: [facility];  Service: Vascular;  Laterality: Right;  [name] VIDEO BRONCHOSCOPY WITH ENDOBRONCHIAL ULTRASOUND N/A 07/19/2016   Procedure: VIDEO BRONCHOSCOPY WITH ENDOBRONCHIAL ULTRASOUND;  Surgeon: [name], MD;  Location: [facility] OR;  Service: Thoracic;  Laterality: N/A;    Family History  Problem Relation Age of Onset  . Cancer Mother     Social History   Social History  . Marital status: Divorced    Spouse name: N/A  . Number of children: N/A  . Years of education: N/A   Social History Main Topics  . Smoking status: Current Every Day Smoker    Packs/day: 2.00    Years: 38.00    Types: Cigarettes  . Smokeless tobacco: Former Systems developer    Types: Snuff     Comment: using chantix- smoking once in a while  . Alcohol use 1.8 oz/week    3 Cans of beer per week     Comment: reports drinking 2-3 beers a week  . Drug use:     Types: Hydrocodone  . Sexual  activity: Not Currently   Other Topics Concern  . Not on file   Social History Narrative  . No narrative on file     PHYSICAL EXAMINATION  ECOG PERFORMANCE STATUS: 1 - Symptomatic but completely ambulatory   Vitals with BMI 11/16/2016  Height   Weight 238 lbs 3 oz  BMI   Systolic 381  Diastolic 74  Pulse 94  Respirations 16     Physical Exam  Constitutional: He is oriented to person, place, and time and well-developed, well-nourished, and in no distress. No distress.  HENT:  Head: Normocephalic and atraumatic.  Mouth/Throat: Oropharynx is clear and moist.  Eyes: Conjunctivae and EOM are normal. Pupils are equal, round, and reactive to light. Right eye exhibits no discharge. Left eye exhibits no discharge.  Neck: Normal range of motion. Neck supple. No thyromegaly present.  Cardiovascular: Normal rate, regular rhythm and normal heart sounds.   Pulmonary/Chest: Effort normal. No  respiratory distress.  Abdominal: Soft. Bowel sounds are normal. He exhibits no distension and no mass. There is no tenderness. There is no rebound and no guarding.  Obese  Musculoskeletal: Normal range of motion. He exhibits no edema.  Lymphadenopathy:    He has no cervical adenopathy.  Neurological: He is alert and oriented to person, place, and time. No cranial nerve deficit. Gait normal. Coordination normal.  Skin: Skin is warm and dry. He is not diaphoretic.  Nursing note and vitals reviewed.   LABORATORY DATA: I have reviewed the data as listed.  Results for TANNAR, BROKER (MRN [id number])   Ref. Range 11/16/2016 12:13  Sodium Latest Ref Range: 135 - 145 mmol/L 135  Potassium Latest Ref Range: 3.5 - 5.1 mmol/L 3.5  Chloride Latest Ref Range: 101 - 111 mmol/L 98 (L)  CO2 Latest Ref Range: 22 - 32 mmol/L 27  Glucose Latest Ref Range: 65 - 99 mg/dL 168 (H)  BUN Latest Ref Range: 6 - 20 mg/dL 7  Creatinine Latest Ref Range: 0.61 - 1.24 mg/dL 0.65  Calcium Latest Ref Range: 8.9 - 10.3  mg/dL 9.2  Anion gap Latest Ref Range: 5 - 15  10  Alkaline Phosphatase Latest Ref Range: 38 - 126 U/L 119  Albumin Latest Ref Range: 3.5 - 5.0 g/dL 3.5  AST Latest Ref Range: 15 - 41 U/L 31  ALT Latest Ref Range: 17 - 63 U/L 24  Total Protein Latest Ref Range: 6.5 - 8.1 g/dL 7.8  Total Bilirubin Latest Ref Range: 0.3 - 1.2 mg/dL 0.5  EGFR (African American) Latest Ref Range: >60 mL/min >60  EGFR (Non-African Amer.) Latest Ref Range: >60 mL/min >60  WBC Latest Ref Range: 4.0 - 10.5 K/uL 6.8  RBC Latest Ref Range: 4.22 - 5.81 MIL/uL 4.16 (L)  Hemoglobin Latest Ref Range: 13.0 - 17.0 g/dL 13.5  HCT Latest Ref Range: 39.0 - 52.0 % 48.8  MCV Latest Ref Range: 78.0 - 100.0 fL 117.3 (H)  MCH Latest Ref Range: 26.0 - 34.0 pg 32.5  MCHC Latest Ref Range: 30.0 - 36.0 g/dL 27.7 (L)  RDW Latest Ref Range: 11.5 - 15.5 % 16.3 (H)  Platelets Latest Ref Range: 150 - 400 K/uL 147 (L)  Neutrophils Latest Units: % 77  Lymphocytes Latest Units: % 13  Monocytes Relative Latest Units: % 8  Eosinophil Latest Units: % 2  Basophil Latest Units: % 0  NEUT# Latest Ref Range: 1.7 - 7.7 K/uL 5.3  Lymphocyte # Latest Ref Range: 0.7 - 4.0 K/uL 0.9  Monocyte # Latest Ref Range: 0.1 - 1.0 K/uL 0.5  Eosinophils Absolute Latest Ref Range: 0.0 - 0.7 K/uL 0.1  Basophils Absolute Latest Ref Range: 0.0 - 0.1 K/uL 0.0    RADIOGRAPHIC STUDIES: I have personally reviewed the radiological images as listed and agreed with the findings in the report.  CLINICAL DATA:  Stable IIIa right lung non-small cell cancer. Initial diagnosis July 2017. Restaging.  EXAM: CT CHEST, ABDOMEN, AND PELVIS WITH CONTRAST  TECHNIQUE: Multidetector CT imaging of the chest, abdomen and pelvis was performed following the standard protocol during bolus administration of intravenous contrast.  CONTRAST:  154m ISOVUE-300 IOPAMIDOL (ISOVUE-300) INJECTION 61%  COMPARISON:  Abdominal CT 06/17/2016, PET-CT 07/01/2016 and CT simulator  study 08/06/2016  FINDINGS: CT CHEST FINDINGS   IMPRESSION: 1. Interval response to therapy. The previously demonstrated mediastinal and right hilar adenopathy and resulting right middle lobe atelectasis have all improved. 2. No discrete residual lung masses are identified. There  is new multifocal ground-glass opacity within the right lower lobe, and to a lesser extent in the left upper lobe. These are probably inflammatory/treatment related. 3. No evidence of abdominopelvic metastatic disease. Stable prominent lymph nodes in the upper abdomen, likely reactive. 4. Decompressed mid SVC without specific signs of SVC occlusion.   Electronically Signed   By: Richardean Sale M.D.   On: 11/15/2016 15:06    Study Result   CLINICAL DATA:  Recently diagnosed right lower lobe lung cancer. Initial staging.  EXAM: MRI HEAD WITHOUT AND WITH CONTRAST  TECHNIQUE: Multiplanar, multiecho pulse sequences of the brain and surrounding structures were obtained without and with intravenous contrast.  CONTRAST:  9m MULTIHANCE GADOBENATE DIMEGLUMINE 529 MG/ML IV SOLN  COMPARISON:  None.  FINDINGS: There is no evidence of acute infarct, intracranial hemorrhage, mass, midline shift, or extra-axial fluid collection. The ventricles and sulci are normal. The cerebellar tonsils minimally extend below the foramen magnum, within normal limits. Small foci of T2 hyperintensity in the cerebral white matter are mildly advanced for age.  A small developmental venous anomaly is noted in the right cerebellum. No abnormal parenchymal or meningeal enhancement suggestive of metastatic disease is seen. Motion artifact on postcontrast imaging could potentially obscure very small lesions.  Orbits are unremarkable. No significant inflammatory disease is seen in the paranasal sinuses are mastoid air cells. The major intracranial vascular flow voids are preserved with the left vertebral artery  being dominant. No focal osseous lesion is identified.  IMPRESSION: 1. No evidence of intracranial metastases. 2. Mild cerebral white matter disease, nonspecific but may reflect chronic small vessel ischemia.   Electronically Signed   By: ALogan BoresM.D.   On: 07/22/2016 13:56    PATHOLOGY: As above in oncology history.    ASSESSMENT AND PLAN:  Stage IIIA (T3N2M0) adenocarcinoma of right lung Tobacco Use GERD Radiation induced esophagitis, mild Nausea Bronchitis Macrocytosis vomiting  Patient competed therapy six weeks ago and he had a wonderful response.  CT imaging was reviewed with the patient. Results are noted above.   We discussed recent data regarding durvalumab in stage III NSCLC. We discussed side effects of immunotherapy.   In a phase III trial, over 700 patients with unresectable stage III NSCLC without progression after at least two cycles of platinum-based chemoradiotherapy were randomly assigned to the programmed death ligand 1 (PD-L1) antibody durvalumab or placebo in a 2:1 ratio. Relative to placebo, durvalumab increased the median PFS (16.8 versus 5.6 months; HR for disease progression or death 0.52, 95% CI 0.42-0.65), response rate (28 versus 16 percent; relative risk [RR] 1.78, 95% CI 1.27-2.51), and median time to death or distant metastasis (23.2 versus 14.6 months; HR 0.52, 95% CI 0.39-0.69). The benefit in PFS with durvalumab was observed irrespective of PD-L1 expression before chemoradiotherapy and was evident in both smokers and nonsmokers. OS results are immature. Grade 3 or 4 adverse events occurred in 30 percent of the patients who received durvalumab and 26 percent of those who received placebo, with the most common severe adverse event being pneumonia. These results demonstrate efficacy and tolerability of durvalumab for treatment of unresectable stage III NSCLC in patients who experience an objective response or stable disease following completion  of chemoradiotherapy.  He is very interested In proveeding.  He will be set up for teaching prior to beginning immunotherapy.  I addressed the importance of smoking cessation with the patient in detail.  We discussed the health benefits of cessation.  We discussed the health detriments  of ongoing tobacco use including but not limited to COPD, heart disease and malignancy. We reviewed the multiple options for cessation. We discussed smoking cessation. He says he plans to quit today.  I recommended the [employer] program to help manage his fatigue.  I will refer Phinneas to GI to evaluate his heartburn.  He was prescribed an antibiotic given the ground glass opacity seen on CT.    I have refilled Zofran.  Patient will have his port flushed today.  B12 and folate will be checked given his macrocytosis.  He will return for follow-up one week after starting immunotherapy.  Meds ordered this encounter  Medications  . ondansetron (ZOFRAN) 8 MG tablet    Sig: Take 1 tablet (8 mg total) by mouth every 8 (eight) hours as needed for nausea or vomiting.    Dispense:  30 tablet    Refill:  2  . DISCONTD: azithromycin (ZITHROMAX Z-PAK) 250 MG tablet    Sig: Take 2 tabs on day 1 then one pill daily until completed.    Dispense:  6 each    Refill:  0    All questions were answered. The patient knows to call the clinic with any problems, questions or concerns. We can certainly see the patient much sooner if necessary.  This document serves as a record of services personally performed by Ancil Linsey, MD. It was created on her behalf by Elmyra Ricks, a trained medical scribe. The creation of this record is based on the scribe's personal observations and the provider's statements to them. This document has been checked and approved by the attending provider.  I have reviewed the above documentation for accuracy and completeness and I agree with the above.  This note is electronically signed  [id number] Elio Forget, MD  11/16/2016 8:58 AM

## 2016-11-16 NOTE — Progress Notes (Addendum)
Patient on plan of care prior to pathways. 

## 2016-11-16 NOTE — Progress Notes (Addendum)
ALERT: Recent [facility] Treatment decision is outdated. Please await next [facility] decision 

## 2016-11-17 ENCOUNTER — Other Ambulatory Visit (HOSPITAL_COMMUNITY): Payer: Self-pay | Admitting: *Deleted

## 2016-11-17 DIAGNOSIS — C3491 Malignant neoplasm of unspecified part of right bronchus or lung: Secondary | ICD-10-CM

## 2016-11-18 ENCOUNTER — Other Ambulatory Visit (HOSPITAL_COMMUNITY): Payer: Self-pay | Admitting: Emergency Medicine

## 2016-11-18 ENCOUNTER — Telehealth (HOSPITAL_COMMUNITY): Payer: Self-pay | Admitting: Emergency Medicine

## 2016-11-18 NOTE — Telephone Encounter (Signed)
Called pt to tell him we are waiting to get everything worked out and approved for him to start therapy.  I told him I would call him and set everything up as soon as I heard anything.

## 2016-11-19 ENCOUNTER — Telehealth (HOSPITAL_COMMUNITY): Payer: Self-pay | Admitting: Emergency Medicine

## 2016-11-19 ENCOUNTER — Ambulatory Visit (HOSPITAL_COMMUNITY): Payer: Medicare Other

## 2016-11-19 ENCOUNTER — Other Ambulatory Visit (HOSPITAL_COMMUNITY): Payer: Self-pay | Admitting: Pharmacist

## 2016-11-19 NOTE — Telephone Encounter (Signed)
Pt called and wanted his appts clarified.  He is to come in for chemotherapy teaching on 11/24/2016 at 1:30pm.  We are going to try to start chemotherapy on Thursday after teaching.  I explained that the drug is not FDA approved but it is NCCN guidelines for care.  We got the approval to go ahead and give it from our head pharmacist.  Until this drug is FDA approved the insurance is going to deny it but then we will appeal it.  He verbalized understanding.

## 2016-11-22 ENCOUNTER — Encounter: Payer: Self-pay | Admitting: Internal Medicine

## 2016-11-22 NOTE — Patient Instructions (Addendum)
[place]   CHEMOTHERAPY INSTRUCTIONS  You have Stage 3 lung cancer. You finished therapy six weeks ago and have had a wonderful response.  We discussed new recommendations for following treatment with immunotherapy for up to 12 months.  This therapy is called durvalumab.  You will receive this drug every 2 weeks for 1 year.  You will see the doctor regularly throughout treatment.  We monitor your lab work prior to every treatment.  The doctor monitors your response to treatment by the way you are feeling, your blood work, and scans periodically.  POTENTIAL SIDE EFFECTS OF TREATMENT:  The most common side effects include: Feeling tired  Muscle and/or bone pain  Constipation  Decreased appetite  Nausea  Swelling  Urinary tract infection   Call or see your healthcare provider right away if you develop any symptoms of the following problems or if these symptoms get worse: Lung problems (pneumonitis). Signs and symptoms may include new or worsening cough, shortness of breath, and chest pain. Liver problems (hepatitis). Signs and symptoms include yellowing of your skin or the whites of your eyes, severe nausea or vomiting, pain on the right side of your stomach area (abdomen), drowsiness, dark urine (tea colored), bleeding or bruising more easily than normal, and feeling less hungry than usual. Intestinal problems (colitis). Signs and symptoms may include diarrhea; more bowel movements than usual; stools that are black, tarry, sticky, or have blood or mucus; and severe stomach-area (abdomen) pain or tenderness. Hormone gland problems (especially the thyroid, adrenals, pituitary, and pancreas). Signs and symptoms that your hormone glands are not working properly may include headaches that will not go away or unusual headaches; extreme tiredness; weight gain or weight loss; dizziness or fainting; feeling more hungry or thirsty than usual; hair loss; feeling cold;  constipation; your voice gets deeper; urinating more often than usual; nausea or vomiting; stomach area (abdomen) pain; and changes in mood or behavior, such as decreased sex drive, irritability, or forgetfulness. Kidney problems, including nephritis and kidney failure. Signs of kidney problems may include producing less urine than normal, blood in your urine, swelling in your ankles, and loss of appetite. Skin problems. Signs may include rash, itching, and skin blistering. Problems in other organs. Signs and symptoms may include neck stiffness; headache; confusion; fever; changes in mood or behavior; blurry vision, double vision, or other vision problems; and eye pain or redness. Severe infections. Signs and symptoms may include fever, cough, frequent urination, pain when urinating, and flu-like symptoms. Severe infusion reactions. Signs and symptoms may include chills or shaking, itching or rash, flushing, shortness of breath or wheezing, dizziness, fever, feeling like passing out, back or neck pain, and facial swelling. Getting medical treatment right away may help keep these problems from becoming more serious. Your healthcare provider will check you for these problems during your treatment with IMFINZI. Your healthcare provider may treat you with corticosteroid or hormone replacement medicines. Your healthcare provider may delay or completely stop treatment with IMFINZI if you have severe side effects.    EDUCATIONAL MATERIALS GIVEN AND REVIEWED: Chemotherapy and you book, nutrition book, information on durvalumab   SELF CARE ACTIVITIES WHILE ON CHEMOTHERAPY: Hydration Increase your fluid intake 48 hours prior to treatment and drink at least 8 to 12 cups (64 ounces) of water/decaff beverages per day after treatment. You can still have your cup of coffee or soda but these beverages do not count as part of your 8 to 12 cups that  you need to drink daily. No alcohol intake.  Medications Continue  taking your normal prescription medication as prescribed.  If you start any new herbal or new supplements please let us know first to make sure it is safe.  Mouth Care Have teeth cleaned professionally before starting treatment. Keep dentures and partial plates clean. Use soft toothbrush and do not use mouthwashes that contain alcohol. Biotene is a good mouthwash that is available at most pharmacies or may be ordered by calling [phone]. Use warm salt water gargles (1 teaspoon salt per 1 quart warm water) before and after meals and at bedtime. Or you may rinse with 2 tablespoons of three-percent hydrogen peroxide mixed in eight ounces of water. If you are still having problems with your mouth or sores in your mouth please call the clinic. If you need dental work, please let Dr. Whitney Muse know before you go for your appointment so that we can coordinate the best possible time for you in regards to your chemo regimen. You need to also let your dentist know that you are actively taking chemo. We may need to do labs prior to your dental appointment.   Skin Care Always use sunscreen that has not expired and with SPF (Sun Protection Factor) of 50 or higher. Wear hats to protect your head from the sun. Remember to use sunscreen on your hands, ears, face, & feet.  Use good moisturizing lotions such as udder cream, eucerin, or even Vaseline. Some chemotherapies can cause dry skin, color changes in your skin and nails.    . Avoid long, hot showers or baths. . Use gentle, fragrance-free soaps and laundry detergent. . Use moisturizers, preferably creams or ointments rather than lotions because the thicker consistency is better at preventing skin dehydration. Apply the cream or ointment within 15 minutes of showering. Reapply moisturizer at night, and moisturize your hands every time after you wash them.  Hair Loss (if your doctor says your hair will fall out)  . If your doctor says that your hair is likely  to fall out, decide before you begin chemo whether you want to wear a wig. You may want to shop before treatment to match your hair color. . Hats, turbans, and scarves can also camouflage hair loss, although some people prefer to leave their heads uncovered. If you go bare-headed outdoors, be sure to use sunscreen on your scalp. . Cut your hair short. It eases the inconvenience of shedding lots of hair, but it also can reduce the emotional impact of watching your hair fall out. . Don't perm or color your hair during chemotherapy. Those chemical treatments are already damaging to hair and can enhance hair loss. Once your chemo treatments are done and your hair has grown back, it's OK to resume dyeing or perming hair. With chemotherapy, hair loss is almost always temporary. But when it grows back, it may be a different color or texture. In older adults who still had hair color before chemotherapy, the new growth may be completely gray.  Often, new hair is very fine and soft.  Infection Prevention Please wash your hands for at least 30 seconds using warm soapy water. Handwashing is the #1 way to prevent the spread of germs. Stay away from sick people or people who are getting over a cold. If you develop respiratory systems such as green/yellow mucus production or productive cough or persistent cough let us know and we will see if you need an antibiotic. It is a good  idea to keep a pair of gloves on when going into grocery stores/[employer] to decrease your risk of coming into contact with germs on the carts, etc. Carry alcohol hand gel with you at all times and use it frequently if out in public. If your temperature reaches 100.5 or higher please call the clinic and let us know.  If it is after hours or on the weekend please go to the ER if your temperature is over 100.5.  Please have your own personal thermometer at home to use.    Sex and bodily fluids If you are going to have sex, a condom must be used to  protect the person that isn't taking chemotherapy. Chemo can decrease your libido (sex drive). For a few days after chemotherapy, chemotherapy can be excreted through your bodily fluids.  When using the toilet please close the lid and flush the toilet twice.  Do this for a few day after you have had chemotherapy.     Effects of chemotherapy on your sex life Some changes are simple and won't last long. They won't affect your sex life permanently. Sometimes you may feel: . too tired . not strong enough to be very active . sick or sore  . not in the mood . anxious or low Your anxiety might not seem related to sex. For example, you may be worried about the cancer and how your treatment is going. Or you may be worried about money, or about how you family are coping with your illness. These things can cause stress, which can affect your interest in sex. It's important to talk to your partner about how you feel. Remember - the changes to your sex life don't usually last long. There's usually no medical reason to stop having sex during chemo. The drugs won't have any long term physical effects on your performance or enjoyment of sex. Cancer can't be passed on to your partner during sex  Contraception It's important to use reliable contraception during treatment. Avoid getting pregnant while you or your partner are having chemotherapy. This is because the drugs may harm the baby. Sometimes chemotherapy drugs can leave a man or woman infertile.  This means you would not be able to have children in the future. You might want to talk to someone about permanent infertility. It can be very difficult to learn that you may no longer be able to have children. Some people find counselling helpful. There might be ways to preserve your fertility, although this is easier for men than for women. You may want to speak to a fertility expert. You can talk about sperm banking or harvesting your eggs. You can also ask about  other fertility options, such as donor eggs. If you have or have had breast cancer, your doctor might advise you not to take the contraceptive pill. This is because the hormones in it might affect the cancer.  It is not known for sure whether or not chemotherapy drugs can be passed on through semen or secretions from the vagina. Because of this some doctors advise people to use a barrier method if you have sex during treatment. This applies to vaginal, anal or oral sex. Generally, doctors advise a barrier method only for the time you are actually having the treatment and for about a week after your treatment. Advice like this can be worrying, but this does not mean that you have to avoid being intimate with your partner. You can still have close contact with your  partner and continue to enjoy sex.  Animals If you have cats or birds we just ask that you not change the litter or change the cage.  Please have someone else do this for you while you are on chemotherapy.   Food Safety During and After Cancer Treatment Food safety is important for people both during and after cancer treatment. Cancer and cancer treatments, such as chemotherapy, radiation therapy, and stem cell/bone marrow transplantation, often weaken the immune system. This makes it harder for your body to protect itself from foodborne illness, also called food poisoning. Foodborne illness is caused by eating food that contains harmful bacteria, parasites, or viruses.  Foods to avoid Some foods have a higher risk of becoming tainted with bacteria. These include: [name] Unwashed fresh fruit and vegetables, especially leafy vegetables that can hide dirt and other contaminants . Raw sprouts, such as alfalfa sprouts . Raw or undercooked beef, especially ground beef, or other raw or undercooked meat and poultry . Fatty, fried, or spicy foods immediately before or after treatment.  These can sit heavy on your stomach and make you feel  nauseous. . Raw or undercooked shellfish, such as oysters. . Sushi and sashimi, which often contain raw fish.  . Unpasteurized beverages, such as unpasteurized fruit juices, raw milk, raw yogurt, or cider . Undercooked eggs, such as soft boiled, over easy, and poached; raw, unpasteurized eggs; or foods made with raw egg, such as homemade raw cookie dough and homemade mayonnaise Simple steps for food safety Shop smart. . Do not buy food stored or displayed in an unclean area. . Do not buy bruised or damaged fruits or vegetables. . Do not buy cans that have cracks, dents, or bulges. . Pick up foods that can spoil at the end of your shopping trip and store them in a cooler on the way home. Prepare and clean up foods carefully. . Rinse all fresh fruits and vegetables under running water, and dry them with a clean towel or paper towel. . Clean the top of cans before opening them. . After preparing food, wash your hands for 20 seconds with hot water and soap. Pay special attention to areas between fingers and under nails. . Clean your utensils and dishes with hot water and soap. [name] Disinfect your kitchen and cutting boards using 1 teaspoon of liquid, unscented bleach mixed into 1 quart of water.   Dispose of old food. . Eat canned and packaged food before its expiration date (the "use by" or "best before" date). . Consume refrigerated leftovers within 3 to 4 days. After that time, throw out the food. Even if the food does not smell or look spoiled, it still may be unsafe. Some bacteria, such as Listeria, can grow even on foods stored in the refrigerator if they are kept for too long. Take precautions when eating out. . At restaurants, avoid buffets and salad bars where food sits out for a long time and comes in contact with many people. Food can become contaminated when someone with a virus, often a norovirus, or another "bug" handles it. . Put any leftover food in a "to-go" container yourself, rather  than having the server do it. And, refrigerate leftovers as soon as you get home. . Choose restaurants that are clean and that are willing to prepare your food as you order it cooked.     MEDICATIONS:  Zofran/Ondansetron '8mg'$  tablet. Take 1 tablet every 8 hours as needed for nausea/vomiting. (#1 nausea med to take, this can constipate)  Compazine/Prochlorperazine '10mg'$  tablet. Take 1 tablet every 6 hours as needed for nausea/vomiting. (#2 nausea med to take, this can make you sleepy)   EMLA cream. Apply a quarter size amount to port site 1 hour prior to chemo. Do not rub in. Cover with plastic wrap.   Over-the-Counter Meds:  Miralax 1 capful in 8 oz of fluid daily. May increase to two times a day if needed. This is a stool softener. If this doesn't work proceed you can add:  Senokot S-start with 1 tablet two times a day and increase to 4 tablets two times a day if needed. (total of 8 tablets in a 24 hour period). This is a stimulant laxative.   Call us if this does not help your bowels move.   Imodium '2mg'$  capsule. Take 2 capsules after the 1st loose stool and then 1 capsule every 2 hours until you go a total of 12 hours without having a loose stool. Call the [facility] if loose stools continue. If diarrhea occurs @ bedtime, take 2 capsules @ bedtime. Then take 2 capsules every 4 hours until morning. Call [facility].     Diarrhea Sheet  If you are having loose stools/diarrhea, please purchase Imodium and begin taking as outlined:  At the first sign of poorly formed or loose stools you should begin taking Imodium(loperamide) 2 mg capsules.  Take two caplets ('4mg'$ ) followed by one caplet ('2mg'$ ) every 2 hours until you have had no diarrhea for 12 hours.  During the night take two caplets ('4mg'$ ) at bedtime and continue every 4 hours  during the night until the morning.  Stop taking Imodium only after there is no sign of diarrhea for 12 hours.    Always call the [facility] if you are having loose stools/diarrhea that you can't get under control.  Loose stools/disrrhea leads to dehydration (loss of water) in your body.  We have other options of trying to get the loose stools/diarrhea to stopped but you must let us know!     Constipation Sheet *Miralax in 8 oz of fluid daily.  May increase to two times a day if needed.  This is a stool softener.  If this not enough to keep your bowel regular:  You can add:  *Senokot S, start with one tablet twice a day and can increase to 4 tablets twice a day if needed.  This is a stimulant laxative.   Sometimes when you take pain medication you need BOTH a medicine to keep your stool soft and a medicine to help your bowel push it out!  Please call if the above does not work for you.   Do not go more than 2 days without a bowel movement.  It is very important that you do not become constipated.  It will make you feel sick to your stomach (nausea) and can cause abdominal pain and vomiting.     Nausea Sheet  Zofran/Ondansetron '8mg'$  tablet. Take 1 tablet every 8 hours as needed for nausea/vomiting. (#1 nausea med to take, this can constipate)  Compazine/Prochlorperazine '10mg'$  tablet. Take 1 tablet every 6 hours as needed for nausea/vomiting. (#2 nausea med to take, this can make you sleepy)  You can take these medications together or separately.  We would first like for you to try the Ondansetron by itself and then take the Prochloperizine if needed. But  you are allowed to take both medications at the same time if your nausea is that severe.  If you are having persistent nausea (nausea that does not stop) please take these medications on a staggered schedule so that the nausea medication stays in your body.  Please call the [facility] and let us know the amount of nausea that you are  experiencing.  If you begin to vomit, you need to call the [facility] and if it is the weekend and you have vomited more than one time and cant get it to stop-go to the Emergency Room.  Persistent nausea/vomiting can lead to dehydration (loss of fluid in your body) and will make you feel terrible.   Ice chips, sips of clear liquids, foods that are @ room temperature, crackers, and toast tend to be better tolerated.     SYMPTOMS TO REPORT AS SOON AS POSSIBLE AFTER TREATMENT:  FEVER GREATER THAN 100.5 F  CHILLS WITH OR WITHOUT FEVER  NAUSEA AND VOMITING THAT IS NOT CONTROLLED WITH YOUR NAUSEA MEDICATION  UNUSUAL SHORTNESS OF BREATH  UNUSUAL BRUISING OR BLEEDING  TENDERNESS IN MOUTH AND THROAT WITH OR WITHOUT PRESENCE OF ULCERS  URINARY PROBLEMS  BOWEL PROBLEMS  UNUSUAL RASH    Wear comfortable clothing and clothing appropriate for easy access to any Portacath or PICC line. Let us know if there is anything that we can do to make your therapy better!    What to do if you need assistance after hours or on the weekends: CALL [phone].  HOLD on the line, do not hang up.  You will hear multiple messages but at the end you will be connected with a nurse triage line.  They will contact Dr Whitney Muse if necessary.  Most of the time they will be able to assist you.   Do not call the hospital operator.  Dr Whitney Muse will not answer phone calls received by them.     I have been informed and understand all of the instructions given to me and have received a copy. I have been instructed to call the clinic [phone] or my family physician as soon as possible for continued medical care, if indicated. I do not have any more questions at this time but understand that I may call the [facility] or the Patient Navigator at [phone] during office hours should I have questions or need assistance in obtaining follow-up care.

## 2016-11-23 ENCOUNTER — Other Ambulatory Visit (HOSPITAL_COMMUNITY): Payer: Self-pay | Admitting: Pharmacist

## 2016-11-23 ENCOUNTER — Other Ambulatory Visit (HOSPITAL_COMMUNITY): Payer: Self-pay | Admitting: Hematology & Oncology

## 2016-11-23 NOTE — Progress Notes (Signed)
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  Off Pathway: Durvalumab 10 mg/kg q14 days  [id number]:Durvalumab 10 mg/kg q14 days:   A cycle is every 14 days:     Durvalumab (Imfinzi(TM)) 10 mg/kg in 250 mL NS IV over 60 minutes on day 1 only of each cycle. Dose Mod: None Additional Orders: Monitor for infusion reactions; interrupt or slow infusion for grade 1 or 2 reaction occurs and consider premedication for subsequent infusion; discontinue for grade 3 or 4 infusion reaction.  See prescribing information for dose interruption guidelines and management of toxicity. Ref: [facility] [phone], 2016.  **Always confirm dose/schedule in your pharmacy ordering system**    Patient Characteristics: Stage III - Unresectable, PS = 0, 1 Check here if patient was staged using an edition prior to AJCC Staging - 8th Edition (i.e., prior to December 06, 2016)? false AJCC T Category: T2a Current Disease Status: No Distant Mets or Local Recurrence AJCC N Category: N2 AJCC M Category: M0 AJCC 8 Stage Grouping: IIIA Performance Status: PS = 0, 1  Intent of Therapy: Curative Intent, Discussed with Patient

## 2016-11-24 ENCOUNTER — Encounter (HOSPITAL_COMMUNITY): Payer: Medicare Other

## 2016-11-24 DIAGNOSIS — C3491 Malignant neoplasm of unspecified part of right bronchus or lung: Secondary | ICD-10-CM

## 2016-11-25 ENCOUNTER — Ambulatory Visit (HOSPITAL_COMMUNITY): Payer: Medicare Other

## 2016-11-25 ENCOUNTER — Other Ambulatory Visit (HOSPITAL_COMMUNITY): Payer: Self-pay | Admitting: Pharmacist

## 2016-11-26 ENCOUNTER — Ambulatory Visit (HOSPITAL_COMMUNITY): Payer: Medicare Other | Admitting: Oncology

## 2016-11-26 ENCOUNTER — Other Ambulatory Visit (HOSPITAL_COMMUNITY): Payer: Self-pay | Admitting: Oncology

## 2016-11-26 ENCOUNTER — Other Ambulatory Visit (HOSPITAL_COMMUNITY): Payer: Self-pay | Admitting: Pharmacist

## 2016-12-01 ENCOUNTER — Other Ambulatory Visit (HOSPITAL_COMMUNITY): Payer: Self-pay | Admitting: Pharmacist

## 2016-12-01 MED ORDER — PREDNISONE 20 MG PO TABS: ORAL_TABLET | ORAL | 0 refills | Status: DC

## 2016-12-02 ENCOUNTER — Encounter (HOSPITAL_BASED_OUTPATIENT_CLINIC_OR_DEPARTMENT_OTHER): Payer: Medicare Other

## 2016-12-02 ENCOUNTER — Ambulatory Visit (HOSPITAL_COMMUNITY): Payer: Medicare Other | Admitting: Hematology & Oncology

## 2016-12-02 VITALS — BP 152/66 | HR 86 | Temp 98.1°F | Resp 18 | Wt 237.0 lb

## 2016-12-02 DIAGNOSIS — Z5112 Encounter for antineoplastic immunotherapy: Secondary | ICD-10-CM

## 2016-12-02 DIAGNOSIS — C3491 Malignant neoplasm of unspecified part of right bronchus or lung: Secondary | ICD-10-CM

## 2016-12-02 DIAGNOSIS — D751 Secondary polycythemia: Secondary | ICD-10-CM | POA: Diagnosis not present

## 2016-12-02 LAB — COMPREHENSIVE METABOLIC PANEL
ALBUMIN: 3.2 g/dL — AB (ref 3.5–5.0)
ALT: 20 U/L (ref 17–63)
ANION GAP: 8 (ref 5–15)
AST: 26 U/L (ref 15–41)
Alkaline Phosphatase: 127 U/L — ABNORMAL HIGH (ref 38–126)
BILIRUBIN TOTAL: 0.7 mg/dL (ref 0.3–1.2)
BUN: 8 mg/dL (ref 6–20)
CHLORIDE: 99 mmol/L — AB (ref 101–111)
CO2: 28 mmol/L (ref 22–32)
Calcium: 8.8 mg/dL — ABNORMAL LOW (ref 8.9–10.3)
Creatinine, Ser: 0.64 mg/dL (ref 0.61–1.24)
GFR calc Af Amer: >60 mL/min (ref >60)
GFR calc non Af Amer: >60 mL/min (ref >60)
GLUCOSE: 131 mg/dL — AB (ref 65–99)
POTASSIUM: 3.3 mmol/L — AB (ref 3.5–5.1)
SODIUM: 135 mmol/L (ref 135–145)
Total Protein: 7.4 g/dL (ref 6.5–8.1)

## 2016-12-02 LAB — CBC WITH DIFFERENTIAL/PLATELET
BASOS ABS: 0 10*3/uL (ref 0.0–0.1)
BASOS PCT: 0 %
EOS ABS: 0.1 10*3/uL (ref 0.0–0.7)
EOS PCT: 1 %
HCT: 41.4 % (ref 39.0–52.0)
Hemoglobin: 13.5 g/dL (ref 13.0–17.0)
Lymphocytes Relative: 12 %
Lymphs Abs: 0.8 10*3/uL (ref 0.7–4.0)
MCH: 32 pg (ref 26.0–34.0)
MCHC: 32.6 g/dL (ref 30.0–36.0)
MCV: 98.1 fL (ref 78.0–100.0)
MONO ABS: 0.6 10*3/uL (ref 0.1–1.0)
MONOS PCT: 8 %
NEUTROS ABS: 5.2 10*3/uL (ref 1.7–7.7)
Neutrophils Relative %: 79 %
PLATELETS: 176 10*3/uL (ref 150–400)
RBC: 4.22 MIL/uL (ref 4.22–5.81)
RDW: 14.5 % (ref 11.5–15.5)
WBC: 6.6 10*3/uL (ref 4.0–10.5)

## 2016-12-02 MED ORDER — SODIUM CHLORIDE 0.9% FLUSH
10.0000 mL | INTRAVENOUS | Status: DC | PRN
  Administered 2016-12-02: 10 mL via INTRAVENOUS
  Filled 2016-12-02: qty 10

## 2016-12-02 MED ORDER — HEPARIN SOD (PORK) LOCK FLUSH 100 UNIT/ML IV SOLN
INTRAVENOUS | Status: AC
  Filled 2016-12-02: qty 5

## 2016-12-02 MED ORDER — HEPARIN SOD (PORK) LOCK FLUSH 100 UNIT/ML IV SOLN
500.0000 [IU] | Freq: Once | INTRAVENOUS | Status: AC
  Administered 2016-12-02: 500 [IU] via INTRAVENOUS

## 2016-12-02 MED ORDER — SODIUM CHLORIDE 0.9 % IV SOLN
10.0000 mg/kg | Freq: Once | INTRAVENOUS | Status: AC
  Administered 2016-12-02: 1100 mg via INTRAVENOUS
  Filled 2016-12-02: qty 20

## 2016-12-02 MED ORDER — SODIUM CHLORIDE 0.9 % IV SOLN
INTRAVENOUS | Status: DC
  Administered 2016-12-02: 12:00:00 via INTRAVENOUS

## 2016-12-02 NOTE — Patient Instructions (Signed)
[facility] Discharge Instructions for Patients Receiving Chemotherapy   Beginning January 23rd 2017 lab work for the Cancer Center will be done in the  Main lab at [facility] on 1st floor. If you have a lab appointment with the Cancer Center please come in thru the  Main Entrance and check in at the main information desk   Today you received the following chemotherapy agents Imfinzi. Follow-up as scheduled. Call clinic for any questions or concerns  To help prevent nausea and vomiting after your treatment, we encourage you to take your nausea medication   If you develop nausea and vomiting, or diarrhea that is not controlled by your medication, call the clinic.  The clinic phone number is [phone]. Office hours are Monday-Friday 8:30am-5:00pm.  BELOW ARE SYMPTOMS THAT SHOULD BE REPORTED IMMEDIATELY:  *FEVER GREATER THAN 101.0 F  *CHILLS WITH OR WITHOUT FEVER  NAUSEA AND VOMITING THAT IS NOT CONTROLLED WITH YOUR NAUSEA MEDICATION  *UNUSUAL SHORTNESS OF BREATH  *UNUSUAL BRUISING OR BLEEDING  TENDERNESS IN MOUTH AND THROAT WITH OR WITHOUT PRESENCE OF ULCERS  *URINARY PROBLEMS  *BOWEL PROBLEMS  UNUSUAL RASH Items with * indicate a potential emergency and should be followed up as soon as possible. If you have an emergency after office hours please contact your primary care physician or go to the nearest emergency department.  Please call the clinic during office hours if you have any questions or concerns.   You may also contact the Patient Navigator at [phone] should you have any questions or need assistance in obtaining follow up care.      Resources For Cancer Patients and their Caregivers ? American Cancer Society: Can assist with transportation, wigs, general needs, runs Look Good Feel Better.        1-888-227-6333 ? Cancer Care: Provides financial assistance, online support groups, medication/co-pay assistance.  1-800-813-HOPE  (4673) ? [facility] Assists [place] cancer patients and their families through emotional , educational and financial support.  336-427-4357 ? [place] DSS Where to apply for food stamps, Medicaid and utility assistance. 336-342-1394 ? [facility]: Transportation to medical appointments. 336-347-2287 ? Social Security Administration: May apply for disability if have a Stage IV cancer. 336-342-7796 1-800-772-1213 ? [place] Aging, Disability and Transit Services: Assists with nutrition, care and transit needs. 336-349-2343         

## 2016-12-02 NOTE — Progress Notes (Signed)
Erik [name] tolerated chemo tx well without complaints or incident.VSS throughout infusion. Reviewed side effects of new medication with understanding verbalized. VSS upon discharge. Pt discharged self ambulatory in satisfactory condition

## 2016-12-03 ENCOUNTER — Other Ambulatory Visit (HOSPITAL_COMMUNITY): Payer: Self-pay | Admitting: Pharmacist

## 2016-12-03 ENCOUNTER — Ambulatory Visit (HOSPITAL_COMMUNITY): Payer: Medicare Other

## 2016-12-03 ENCOUNTER — Telehealth (HOSPITAL_COMMUNITY): Payer: Self-pay

## 2016-12-03 NOTE — Telephone Encounter (Signed)
See telephone encounter note.

## 2016-12-09 ENCOUNTER — Ambulatory Visit (HOSPITAL_COMMUNITY): Payer: Medicare Other

## 2016-12-09 ENCOUNTER — Encounter (HOSPITAL_COMMUNITY): Payer: Medicare Other | Attending: Hematology | Admitting: Oncology

## 2016-12-09 VITALS — BP 130/74 | HR 107 | Temp 98.2°F | Resp 18

## 2016-12-09 DIAGNOSIS — D751 Secondary polycythemia: Secondary | ICD-10-CM | POA: Insufficient documentation

## 2016-12-09 DIAGNOSIS — C3491 Malignant neoplasm of unspecified part of right bronchus or lung: Secondary | ICD-10-CM | POA: Insufficient documentation

## 2016-12-09 DIAGNOSIS — C3431 Malignant neoplasm of lower lobe, right bronchus or lung: Secondary | ICD-10-CM

## 2016-12-09 MED ORDER — PROCHLORPERAZINE MALEATE 10 MG PO TABS: ORAL_TABLET | ORAL | 1 refills | Status: DC

## 2016-12-09 NOTE — Assessment & Plan Note (Addendum)
Stage IIIA (T3N2M0) adenocarcinoma of right lung.  S/P biopsy by Dr. Roxan Hockey on 07/19/2016 proving Stage III disease.  Concomitant chemoXRT with Cisplatin/Etoposide finishing on 09/20/2016 with curative intent.  Now on immunotherapy durvalumab every 2 weeks beginning on 12/02/2016 which will be given up to 1 year.  Oncology history is updated.    No role for labs today.  Pre-treatment labs: CBC diff, CMET, TSH as ordered.  Smoking cessation education is provided today.  He notes that he is down to < 1 ppd (previously smoking 2 + ppd prior to Dx).  CT chest is ordered and scheduled in March 2018 by Dr. Tammi Klippel (Radiation Oncology).  He has an appointment with GI tomorrow for further evaluation of GERD and ongoing weight loss.  I suspect he will need an EGD to further evaluate this issue.  He may have radiation esophagitis, but it is important to rule out other possible etiologies.  He is provided education regarding immunotherapy, its mechanism of action, its role in his cancer care, and toxicities to be on the look out for and to start high dose prednisone.  Return next week for cycle #2.  Return in 3 weeks for follow-up and cycle #3.

## 2016-12-09 NOTE — Patient Instructions (Signed)
[place] at [facility] Discharge Instructions  RECOMMENDATIONS MADE BY THE CONSULTANT AND ANY TEST RESULTS WILL BE SENT TO YOUR REFERRING PHYSICIAN.  No labs  Return next week for treatment and labs  Return in 3 weeks for follow up, treatment, and labs  Compazine refilled - sent through computer system  Thank you for choosing [place] at [facility] to provide your oncology and hematology care.  To afford each patient quality time with our provider, please arrive at least 15 minutes before your scheduled appointment time.    If you have a lab appointment with the [facility] please come in thru the  Main Entrance and check in at the main information desk  You need to re-schedule your appointment should you arrive 10 or more minutes late.  We strive to give you quality time with our providers, and arriving late affects you and other patients whose appointments are after yours.  Also, if you no show three or more times for appointments you may be dismissed from the clinic at the providers discretion.     Again, thank you for choosing [facility].  Our hope is that these requests will decrease the amount of time that you wait before being seen by our physicians.       _____________________________________________________________  Should you have questions after your visit to [facility], please contact our office at [phone] between the hours of 8:30 a.m. and 4:30 p.m.  Voicemails left after 4:30 p.m. will not be returned until the following business day.  For prescription refill requests, have your pharmacy contact our office.       Resources For Cancer Patients and their Caregivers ? American Cancer Society: Can assist with transportation, wigs, general needs, runs Look Good Feel Better.        3121679010 ? Cancer Care: Provides financial assistance, online support groups, medication/co-pay  assistance.  1-800-813-HOPE (458) 639-3547) ? [facility] Assists [place] cancer patients and their families through emotional , educational and financial support.  418-604-5871 ? [place] DSS Where to apply for food stamps, Medicaid and utility assistance. 228-046-9654 ? [employer]: Transportation to medical appointments. (949) 618-3579 ? Social Security Administration: May apply for disability if have a Stage IV cancer. 908-015-6204 431 040 5760 ? [employer], Disability and Transit Services: Assists with nutrition, care and transit needs. [place] Support Programs: '@10RELATIVEDAYS'$ @ > Cancer Support Group  2nd Tuesday of the month 1pm-2pm, Journey Room  > Creative Journey  3rd Tuesday of the month 1130am-1pm, Journey Room  > Look Good Feel Better  1st Wednesday of the month 10am-12 noon, Journey Room (Call [place] to register [phone])

## 2016-12-09 NOTE — Progress Notes (Signed)
E[place], [place] [id number]  Adenocarcinoma of right lung [facility]) - Plan: TSH, prochlorperazine (COMPAZINE) 10 MG tablet  CURRENT THERAPY: Durvalumab every 2 weeks x 1 year beginning on 12/02/2016.  INTERVAL HISTORY: Erik [name] 56 y.o. 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 S/P concomitant chemoXRT with Cisplatin/Etoposide (08/17/2016- 09/20/2016).  Now on immunotherapy for up to 1 year in accordance with NCCN guidelines.    Adenocarcinoma of right lung ([place])   07/01/2016 PET scan    6.3 x 3.7 cm right lower lobe mass, suspicious for primary bronchogenic neoplasm, as described above. Hypermetabolic thoracic nodal metastases, as above. Additional right perihilar hypermetabolism, indeterminate. Associated right middle lobe atelectasis/collapse.      07/19/2016 Procedure    Bronchoscopy with brushings and biopsies and endobronchial ultrasound with mediastinal lymph node aspirations by Dr. Roxan Hockey      07/21/2016 Pathology Results    Lung, biopsy, Right Middle Lobe - LUNG TISSUE WITH SQUAMOUS METAPLASIA. - NO MALIGNANCY IDENTIFIED.      07/21/2016 Pathology Results    FINE NEEDLE ASPIRATION, ENDOSCOPIC (A) LEVEL 7 (SPECIMEN 1 OF 3, COLLECTED ON 07/19/16): MALIGNANT CELLS CONSISTENT WITH ADENOCARCINOMA.      07/21/2016 Pathology Results    FINE NEEDLE ASPIRATION, EBUS, 4R, B (SPECIMEN 2 OF 3, COLLECTED 07/19/16): MALIGNANT CELLS CONSISTENT WITH ADENOCARCINOMA.      07/21/2016 Pathology Results    FINE NEEDLE ASPIRATION, EBUS, BRUSHING, RIGHT MIDDLE LOBE, D (SPECIMEN 3 OF 3, COLLECTED 07/19/16): MALIGNANT CELLS CONSISTENT WITH ADENOCARCINOMA.      07/22/2016 Imaging    MRI brain- No evidence of intracranial metastases.      08/17/2016 - 09/20/2016 Chemotherapy    The patient had palonosetron (ALOXI) injection 0.25 mg, 0.25 mg, Intravenous,  Once, 1 of 1 cycle  CISplatin (PLATINOL) 123 mg in  sodium chloride 0.9 % 500 mL chemo infusion, 50 mg/m2 = 123 mg, Intravenous,  Once, 1 of 1 cycle  etoposide (VEPESID) 120 mg in sodium chloride 0.9 % 500 mL chemo infusion, 50 mg/m2 = 120 mg, Intravenous,  Once, 1 of 1 cycle  fosaprepitant (EMEND) 150 mg, dexamethasone (DECADRON) 12 mg in sodium chloride 0.9 % 145 mL IVPB, , Intravenous,  Once, 1 of 1 cycle  [No matching medication found in this treatment plan] for chemotherapy treatment.        08/17/2016 -  Radiation Therapy         11/15/2016 Imaging    CT CAP- Interval response to therapy. The previously demonstrated mediastinal and right hilar adenopathy and resulting right middle lobe atelectasis have all improved. 2. No discrete residual lung masses are identified. There is new multifocal ground-glass opacity within the right lower lobe, and to a lesser extent in the left upper lobe. These are probably inflammatory/treatment related. 3. No evidence of abdominopelvic metastatic disease. Stable prominent lymph nodes in the upper abdomen, likely reactive. 4. Decompressed mid SVC without specific signs of SVC occlusion.      12/02/2016 -  Chemotherapy    Imfinzi (durvalumab) immunotherapy every 2 weeks x up to 1 year       He tolerated his first cycle of Imfinzi well.  He notes 1 episode of nausea the evening following Imfinzi.  No nausea since.    He weight continues to decline.  He is seeing GI tomorrow for evaluation of weight loss and ongoing GERD symptoms.  He notes that he is happy  with his weight.  He notes that his weight at 230 lbs is his typical weight (prior to starting treatment).  He is provided education regarding immunotherapy and common side effects to be on the look out for (including when to start high dose prednisone)  Review of Systems  Constitutional: Negative.  Negative for chills and fever.  HENT: Negative.   Eyes: Negative.   Respiratory: Negative.  Negative for cough.   Cardiovascular: Positive  for chest pain (improved).  Gastrointestinal: Positive for heartburn (persistent) and nausea. Negative for blood in stool, constipation, diarrhea and vomiting.  Genitourinary: Negative.   Musculoskeletal: Negative.   Skin: Negative.   Neurological: Negative.  Negative for weakness.  Psychiatric/Behavioral: Negative.     Past Medical History:  Diagnosis Date  . 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 Arthritis   . Chronic back pain   . Depression   . GERD (gastroesophageal reflux disease)   . Seasonal allergies   . Spinal stenosis of lumbar region   . 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 Wheezing     Past Surgical History:  Procedure Laterality Date  . BACK SURGERY    . MULTIPLE EXTRACTIONS WITH ALVEOLOPLASTY N/A 07/08/2014   Procedure: MULTIPLE EXTRACION WITH ALVEOLOPLASTY with EXCISION LESION RIGHT SIDE OF TONGUE;  Surgeon: Gae Bon, DDS;  Location: [facility];  Service: Oral Surgery;  Laterality: N/A;  . PORTACATH PLACEMENT Right 07/30/2016   Procedure: INSERTION PORT-A-CATH;  Surgeon: Vickie Epley, MD;  Location: [facility];  Service: Vascular;  Laterality: Right;  [name] VIDEO BRONCHOSCOPY WITH ENDOBRONCHIAL ULTRASOUND N/A 07/19/2016   Procedure: VIDEO BRONCHOSCOPY WITH ENDOBRONCHIAL ULTRASOUND;  Surgeon: [name], MD;  Location: [facility] OR;  Service: Thoracic;  Laterality: N/A;    Family History  Problem Relation Age of Onset  . Cancer Mother     Social History   Social History  . Marital status: Divorced    Spouse name: N/A  . Number of children: N/A  . Years of education: N/A   Social History Main Topics  . Smoking status: Current Every Day Smoker    Packs/day: 2.00    Years: 38.00    Types: Cigarettes  . Smokeless tobacco: Former Systems developer    Types: Snuff     Comment: using chantix- smoking once in a while  . Alcohol use 1.8 oz/week    3 Cans of beer per week     Comment: reports drinking 2-3 beers a week  . Drug use:     Types: Hydrocodone  .  Sexual activity: Not Currently   Other Topics Concern  . Not on file   Social History Narrative  . No narrative on file     PHYSICAL EXAMINATION  ECOG PERFORMANCE STATUS: 1 - Symptomatic but completely ambulatory  Vitals:   12/09/16 1352  BP: 130/74  Pulse: (!) 107  Resp: 18  Temp: 98.2 F (36.8 C)    GENERAL:alert, no distress, well nourished, well developed, comfortable, cooperative, obese, smiling and unaccompanied SKIN: skin color, texture, turgor are normal, no rashes or significant lesions HEAD: Normocephalic, No masses, lesions, tenderness or abnormalities EYES: normal, EOMI, Conjunctiva are pink and non-injected EARS: External ears normal OROPHARYNX:lips, buccal mucosa, and tongue normal and mucous membranes are moist  NECK: supple, trachea midline LYMPH:  no palpable lymphadenopathy BREAST:not examined LUNGS: clear to auscultation , decreased breath sounds HEART: regular rate & rhythm, no murmurs and no gallops ABDOMEN:abdomen soft, non-tender and normal bowel sounds BACK: Back  symmetric, no curvature. EXTREMITIES:less then 2 second capillary refill, no joint deformities, effusion, or inflammation, no skin discoloration, no cyanosis  NEURO: alert & oriented x 3 with fluent speech, no focal motor/sensory deficits, gait normal   LABORATORY DATA: CBC    Component Value Date/Time   WBC 6.6 12/02/2016 1200   RBC 4.22 12/02/2016 1200   HGB 13.5 12/02/2016 1200   HCT 41.4 12/02/2016 1200   PLT 176 12/02/2016 1200   MCV 98.1 12/02/2016 1200   MCH 32.0 12/02/2016 1200   MCHC 32.6 12/02/2016 1200   RDW 14.5 12/02/2016 1200   LYMPHSABS 0.8 12/02/2016 1200   MONOABS 0.6 12/02/2016 1200   EOSABS 0.1 12/02/2016 1200   BASOSABS 0.0 12/02/2016 1200      Chemistry      Component Value Date/Time   NA 135 12/02/2016 1200   K 3.3 (L) 12/02/2016 1200   CL 99 (L) 12/02/2016 1200   CO2 28 12/02/2016 1200   BUN 8 12/02/2016 1200   CREATININE 0.64 12/02/2016 1200       Component Value Date/Time   CALCIUM 8.8 (L) 12/02/2016 1200   ALKPHOS 127 (H) 12/02/2016 1200   AST 26 12/02/2016 1200   ALT 20 12/02/2016 1200   BILITOT 0.7 12/02/2016 1200        PENDING LABS:   RADIOGRAPHIC STUDIES:  Ct Chest W Contrast  Result Date: 11/15/2016 CLINICAL DATA:  Stable IIIa right lung non-small cell cancer. Initial diagnosis July 2017. Restaging. EXAM: CT CHEST, ABDOMEN, AND PELVIS WITH CONTRAST TECHNIQUE: Multidetector CT imaging of the chest, abdomen and pelvis was performed following the standard protocol during bolus administration of intravenous contrast. CONTRAST:  145m ISOVUE-300 IOPAMIDOL (ISOVUE-300) INJECTION 61% COMPARISON:  Abdominal CT 06/17/2016, PET-CT 07/01/2016 and CT simulator study 08/06/2016 FINDINGS: CT CHEST FINDINGS Cardiovascular: No definite acute vascular findings are seen. There is suboptimal vascular opacification. There is mild aortic atherosclerosis. The lower SVC is decompressed without evidence of intraluminal thrombus. No collateral vessels are seen to suggest SVC obstruction. Right IJ Port-A-Cath tip in the upper right atrium. The heart size is normal. There is no pericardial effusion. Mediastinum/Nodes: There are no residual enlarged mediastinal, hilar or axillary lymph nodes. Possible mild wall thickening of the midesophagus, likely treatment related. The thyroid gland and trachea demonstrate no significant findings. Lungs/Pleura: There is no pleural effusion. Interval partial re-expansion of the right middle lobe. There is underlying opacity and bronchiectasis posteriorly in the middle lobe. There is no residual measurable mass or focal airspace disease posteriorly in the right lower lobe at the site of the previous dominant density. However, there are new ground-glass opacities throughout the right lower lobe. There is also focal ground-glass opacity anteriorly in the left upper lobe. There is stable chronic lung disease with subpleural  reticulation and parenchymal scarring. Musculoskeletal/Chest wall: No chest wall mass or suspicious osseous findings. CT ABDOMEN AND PELVIS FINDINGS Hepatobiliary: Previously demonstrated hepatic steatosis has improved. No focal hepatic abnormalities or abnormal enhancements) No evidence of gallstones, gallbladder wall thickening or biliary dilatation. Pancreas: Unremarkable. No pancreatic ductal dilatation or surrounding inflammatory changes. Spleen: Normal in size without focal abnormality. Adrenals/Urinary Tract: Both adrenal glands appear normal. The kidneys appear normal without evidence of urinary tract calculus, suspicious lesion or hydronephrosis. No bladder abnormalities are seen. Stomach/Bowel: No evidence of bowel wall thickening, distention or surrounding inflammatory change. Mild distal colonic diverticulosis. Vascular/Lymphatic: There are stable prominent lymph nodes within the upper abdomen, measuring up to 1.9 cm short axis in the portacaval  space (image 66), not hypermetabolic on prior PET-CT. No progressive adenopathy seen. No significant vascular findings are present. Reproductive: The prostate gland and seminal vesicles appear stable without suspicious findings. Other: Stable asymmetric fat in the left inguinal canal. The anterior abdominal wall otherwise appears normal. No ascites or peritoneal nodularity. Musculoskeletal: No acute or significant osseous findings. Postsurgical changes from previous lower lumbar fusion. IMPRESSION: 1. Interval response to therapy. The previously demonstrated mediastinal and right hilar adenopathy and resulting right middle lobe atelectasis have all improved. 2. No discrete residual lung masses are identified. There is new multifocal ground-glass opacity within the right lower lobe, and to a lesser extent in the left upper lobe. These are probably inflammatory/treatment related. 3. No evidence of abdominopelvic metastatic disease. Stable prominent lymph nodes in  the upper abdomen, likely reactive. 4. Decompressed mid SVC without specific signs of SVC occlusion. Electronically Signed   By: Richardean Sale M.D.   On: 11/15/2016 15:06   Ct Abdomen Pelvis W Contrast  Result Date: 11/15/2016 CLINICAL DATA:  Stable IIIa right lung non-small cell cancer. Initial diagnosis July 2017. Restaging. EXAM: CT CHEST, ABDOMEN, AND PELVIS WITH CONTRAST TECHNIQUE: Multidetector CT imaging of the chest, abdomen and pelvis was performed following the standard protocol during bolus administration of intravenous contrast. CONTRAST:  182m ISOVUE-300 IOPAMIDOL (ISOVUE-300) INJECTION 61% COMPARISON:  Abdominal CT 06/17/2016, PET-CT 07/01/2016 and CT simulator study 08/06/2016 FINDINGS: CT CHEST FINDINGS Cardiovascular: No definite acute vascular findings are seen. There is suboptimal vascular opacification. There is mild aortic atherosclerosis. The lower SVC is decompressed without evidence of intraluminal thrombus. No collateral vessels are seen to suggest SVC obstruction. Right IJ Port-A-Cath tip in the upper right atrium. The heart size is normal. There is no pericardial effusion. Mediastinum/Nodes: There are no residual enlarged mediastinal, hilar or axillary lymph nodes. Possible mild wall thickening of the midesophagus, likely treatment related. The thyroid gland and trachea demonstrate no significant findings. Lungs/Pleura: There is no pleural effusion. Interval partial re-expansion of the right middle lobe. There is underlying opacity and bronchiectasis posteriorly in the middle lobe. There is no residual measurable mass or focal airspace disease posteriorly in the right lower lobe at the site of the previous dominant density. However, there are new ground-glass opacities throughout the right lower lobe. There is also focal ground-glass opacity anteriorly in the left upper lobe. There is stable chronic lung disease with subpleural reticulation and parenchymal scarring.  Musculoskeletal/Chest wall: No chest wall mass or suspicious osseous findings. CT ABDOMEN AND PELVIS FINDINGS Hepatobiliary: Previously demonstrated hepatic steatosis has improved. No focal hepatic abnormalities or abnormal enhancements) No evidence of gallstones, gallbladder wall thickening or biliary dilatation. Pancreas: Unremarkable. No pancreatic ductal dilatation or surrounding inflammatory changes. Spleen: Normal in size without focal abnormality. Adrenals/Urinary Tract: Both adrenal glands appear normal. The kidneys appear normal without evidence of urinary tract calculus, suspicious lesion or hydronephrosis. No bladder abnormalities are seen. Stomach/Bowel: No evidence of bowel wall thickening, distention or surrounding inflammatory change. Mild distal colonic diverticulosis. Vascular/Lymphatic: There are stable prominent lymph nodes within the upper abdomen, measuring up to 1.9 cm short axis in the portacaval space (image 66), not hypermetabolic on prior PET-CT. No progressive adenopathy seen. No significant vascular findings are present. Reproductive: The prostate gland and seminal vesicles appear stable without suspicious findings. Other: Stable asymmetric fat in the left inguinal canal. The anterior abdominal wall otherwise appears normal. No ascites or peritoneal nodularity. Musculoskeletal: No acute or significant osseous findings. Postsurgical changes from previous lower lumbar fusion.  IMPRESSION: 1. Interval response to therapy. The previously demonstrated mediastinal and right hilar adenopathy and resulting right middle lobe atelectasis have all improved. 2. No discrete residual lung masses are identified. There is new multifocal ground-glass opacity within the right lower lobe, and to a lesser extent in the left upper lobe. These are probably inflammatory/treatment related. 3. No evidence of abdominopelvic metastatic disease. Stable prominent lymph nodes in the upper abdomen, likely reactive. 4.  Decompressed mid SVC without specific signs of SVC occlusion. Electronically Signed   By: Richardean Sale M.D.   On: 11/15/2016 15:06     PATHOLOGY:    ASSESSMENT AND PLAN:  Adenocarcinoma of right lung ([place]) Stage IIIA (T3N2M0) adenocarcinoma of right lung.  S/P biopsy by Dr. Roxan Hockey on 07/19/2016 proving Stage III disease.  Concomitant chemoXRT with Cisplatin/Etoposide finishing on 09/20/2016 with curative intent.  Now on immunotherapy durvalumab every 2 weeks beginning on 12/02/2016 which will be given up to 1 year.  Oncology history is updated.    No role for labs today.  Pre-treatment labs: CBC diff, CMET, TSH as ordered.  Smoking cessation education is provided today.  He notes that he is down to < 1 ppd (previously smoking 2 + ppd prior to Dx).  CT chest is ordered and scheduled in March 2018 by Dr. Tammi Klippel (Radiation Oncology).  He has an appointment with GI tomorrow for further evaluation of GERD and ongoing weight loss.  I suspect he will need an EGD to further evaluate this issue.  He may have radiation esophagitis, but it is important to rule out other possible etiologies.  He is provided education regarding immunotherapy, its mechanism of action, its role in his cancer care, and toxicities to be on the look out for and to start high dose prednisone.  Return next week for cycle #2.  Return in 3 weeks for follow-up and cycle #3.   ORDERS PLACED FOR THIS ENCOUNTER: Orders Placed This Encounter  Procedures  . TSH    MEDICATIONS PRESCRIBED THIS ENCOUNTER: Meds ordered this encounter  Medications  . prochlorperazine (COMPAZINE) 10 MG tablet    Sig: TAKE 1 TABLET EVERY 6 HOURS AS NEEDED FOR NAUSEA AND VOMITING.    Dispense:  30 tablet    Refill:  1    Order Specific Question:   Supervising Provider    Answer:   Patrici Ranks [id number]    THERAPY PLAN:  Continue immunotherapy as outlined for 1 year.  All questions were answered. The patient knows to call  the clinic with any problems, questions or concerns. We can certainly see the patient much sooner if necessary.  Patient and plan discussed with Dr. Ancil Linsey and she is in agreement with the aforementioned.   This note is electronically signed by: Doy Mince 12/09/2016 2:26 PM

## 2016-12-10 ENCOUNTER — Encounter: Payer: Self-pay | Admitting: Gastroenterology

## 2016-12-10 ENCOUNTER — Ambulatory Visit (INDEPENDENT_AMBULATORY_CARE_PROVIDER_SITE_OTHER): Payer: Medicare Other | Admitting: Gastroenterology

## 2016-12-10 ENCOUNTER — Other Ambulatory Visit: Payer: Self-pay

## 2016-12-10 VITALS — BP 149/93 | HR 108 | Temp 98.0°F | Ht 72.0 in | Wt 236.8 lb

## 2016-12-10 DIAGNOSIS — Z1211 Encounter for screening for malignant neoplasm of colon: Secondary | ICD-10-CM

## 2016-12-10 DIAGNOSIS — R1013 Epigastric pain: Secondary | ICD-10-CM | POA: Insufficient documentation

## 2016-12-10 DIAGNOSIS — R1310 Dysphagia, unspecified: Secondary | ICD-10-CM | POA: Diagnosis not present

## 2016-12-10 DIAGNOSIS — R634 Abnormal weight loss: Secondary | ICD-10-CM | POA: Diagnosis not present

## 2016-12-10 DIAGNOSIS — K2100 Gastro-esophageal reflux disease with esophagitis, without bleeding: Secondary | ICD-10-CM

## 2016-12-10 DIAGNOSIS — R1319 Other dysphagia: Secondary | ICD-10-CM | POA: Insufficient documentation

## 2016-12-10 DIAGNOSIS — Z7189 Other specified counseling: Secondary | ICD-10-CM | POA: Insufficient documentation

## 2016-12-10 MED ORDER — PEG 3350-KCL-NA BICARB-NACL 420 G PO SOLR: 4000.0000 mL | ORAL | 0 refills | Status: DC

## 2016-12-10 NOTE — Assessment & Plan Note (Signed)
56 year old gentleman with recent onset GERD, odynophagia, dysphagia, anorexia with weight loss in setting of XRT/chemotherapy for adenocarcinoma of the lung. Lost 30 pounds since onset of treatment. Patient also with epigastric discomfort on exam. Chest CT with questionable esophagitis. He is at increased risk of radiation-induced esophagitis, stricture. Recommend upper endoscopy plus or minus esophageal dilation for further evaluation, deep sedation in the OR given polypharmacy.  I have discussed the risks, alternatives, benefits with regards to but not limited to the risk of reaction to medication, bleeding, infection, perforation and the patient is agreeable to proceed. Written consent to be obtained.  For now continue Nexium twice a day. Medication changes likely based on EGD findings.

## 2016-12-10 NOTE — Assessment & Plan Note (Signed)
No prior colonoscopy. Patient is interested in screening colonoscopy at this time. Plan for colonoscopy with deep sedation in the OR given polypharmacy.  I have discussed the risks, alternatives, benefits with regards to but not limited to the risk of reaction to medication, bleeding, infection, perforation and the patient is agreeable to proceed. Written consent to be obtained.

## 2016-12-10 NOTE — Progress Notes (Signed)
CC'ED TO PCP 

## 2016-12-10 NOTE — Progress Notes (Signed)
Primary Care Physician:  [name], MD  Primary Gastroenterologist:  Garfield Cornea, MD   Chief Complaint  Patient presents with  . Gastroesophageal Reflux    since being on chemo (6-8 months)  . Weight Loss    states back to baseline and he is happy with his weight    HPI:  Erik [name] is a 56 y.o. male here For further evaluation of GERD, unintentional weight loss. Patient has a history of stage IIIa adenocarcinoma right lung. Status post concomitant chemotherapy XRT and cisplatin/etoposide. Completed these therapies 09/20/2016. Now on immunotherapy (durvalumab) for up to one year, receiving injection every 2 weeks. Most recent imaging CT chest/abdomen/pelvis. Possible mild wall thickening in the midesophagus? Treatment related.Interval response to therapy with improvement of mediastinal and right hilar adenopathy, no discrete residual lung masses, no abdominal pelvic metastatic disease.  Patient states that he is pretty much had reflux symptoms since he started XRT/chemotherapy. Did have some odynophagia initially. Continues to have intermittent dysphagia, frequent heartburn which is been difficult to manage. He is on Nexium twice a day, also takes over-the-counter agent such as Tagamet. He has been on lidocaine, magic mouthwash, Carafate but none recently. Patient complains of regurgitation at times. He does have intermittent vomiting. Overall his appetite is slightly improved since he's been off chemotherapy however he notes when he goes to eat he loses his appetite pretty quickly. He's lost 30 pounds since September. He mentions that he is happy with his weight loss however. No prior EGD or colonoscopy. Currently really denies any abdominal pain although he states his stomach feels tight in knots little bit after he has immunotherapy injections. No melena rectal bleeding. He has daily bowel movements.    Current Outpatient Prescriptions  Medication Sig Dispense Refill  . albuterol  (PROVENTIL HFA;VENTOLIN HFA) 108 (90 BASE) MCG/ACT inhaler Inhale 2 puffs into the lungs every 6 (six) hours as needed for wheezing.     [name] ALPRAZolam (XANAX) 1 MG tablet Take 1 mg by mouth 2 (two) times daily as needed for anxiety.     [name] amLODipine (NORVASC) 5 MG tablet Take 5 mg by mouth daily.     . budesonide-formoterol (SYMBICORT) 160-4.5 MCG/ACT inhaler Inhale 2 puffs into the lungs daily.    Hunt Oris IV Inject into the vein. Every 2 weeks    . escitalopram (LEXAPRO) 20 MG tablet Take 20 mg by mouth every morning.     [name] esomeprazole (NEXIUM) 40 MG capsule TAKE (1) CAPSULE BY MOUTH TWICE DAILY BEFORE A MEAL. 60 capsule 0  . gabapentin (NEURONTIN) 300 MG capsule Take 600 mg by mouth 3 (three) times daily.     [name] lidocaine-prilocaine (EMLA) cream Apply a quarter size amount to port site 1 hour prior to chemo. Do not rub in. Cover with plastic wrap. 30 g 2  . methadone (DOLOPHINE) 10 MG tablet Take 10 mg by mouth every 5 (five) hours as needed.     . nystatin (MYCOSTATIN) 100000 UNIT/ML suspension     . ondansetron (ZOFRAN) 8 MG tablet Take 1 tablet (8 mg total) by mouth every 8 (eight) hours as needed for nausea or vomiting. 30 tablet 2  . prochlorperazine (COMPAZINE) 10 MG tablet TAKE 1 TABLET EVERY 6 HOURS AS NEEDED FOR NAUSEA AND VOMITING. 30 tablet 1  . tiZANidine (ZANAFLEX) 4 MG tablet Take 4 mg by mouth 3 (three) times daily.     . varenicline (CHANTIX) 1 MG tablet Take 1 mg by mouth 2 (two) times  daily.    . lidocaine (XYLOCAINE) 2 % solution     . magic mouthwash w/lidocaine SOLN 1 part of each: Diphenhydramine 12.5 mg /5 mL, Viscous lidocaine 2% and Maalox. Swish and swallow 5 mL Q 4 hours. (Patient not taking: Reported on 12/10/2016) 240 mL 1  . predniSONE (DELTASONE) 20 MG tablet Take at the onset of diarrhea.  Take 5 tablets (100 mg) at one time.  Then call the [facility]. (Patient not taking: Reported on 12/10/2016) 30 tablet 0   No current facility-administered medications for  this visit.     Allergies as of 12/10/2016 - Review Complete 12/10/2016  Allergen Reaction Noted  . Tetracyclines & related Other (See Comments) 09/25/2011    Past Medical History:  Diagnosis Date  . Arnold-Chiari syndrome ([place])   . Arthritis   . Chronic back pain   . Depression   . GERD (gastroesophageal reflux disease)   . Seasonal allergies   . Spinal stenosis of lumbar region   . 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 Wheezing     Past Surgical History:  Procedure Laterality Date  . BACK SURGERY     5 total  . MULTIPLE EXTRACTIONS WITH ALVEOLOPLASTY N/A 07/08/2014   Procedure: MULTIPLE EXTRACION WITH ALVEOLOPLASTY with EXCISION LESION RIGHT SIDE OF TONGUE;  Surgeon: Gae Bon, DDS;  Location: [facility];  Service: Oral Surgery;  Laterality: N/A;  . PORTACATH PLACEMENT Right 07/30/2016   Procedure: INSERTION PORT-A-CATH;  Surgeon: Vickie Epley, MD;  Location: [facility];  Service: Vascular;  Laterality: Right;  [name] VIDEO BRONCHOSCOPY WITH ENDOBRONCHIAL ULTRASOUND N/A 07/19/2016   Procedure: VIDEO BRONCHOSCOPY WITH ENDOBRONCHIAL ULTRASOUND;  Surgeon: [name], MD;  Location: [facility];  Service: Thoracic;  Laterality: N/A;    Family History  Problem Relation Age of Onset  . Cancer Mother     breast cancer  . Heart failure Brother   . Colon cancer Neg Hx     not sure, ?grandfather and/or uncle    Social History   Social History  . Marital status: Divorced    Spouse name: N/A  . Number of children: N/A  . Years of education: N/A   Occupational History  . Not on file.   Social History Main Topics  . Smoking status: Current Every Day Smoker    Packs/day: 2.00    Years: 38.00    Types: Cigarettes  . Smokeless tobacco: Former Systems developer    Types: Snuff     Comment: using chantix- smoking once in a while  . Alcohol use 1.8 oz/week    3 Cans of beer per week     Comment: reports drinking 2-3 beers a week  . Drug use:     Types: Hydrocodone  .  Sexual activity: Not Currently   Other Topics Concern  . Not on file   Social History Narrative  . No narrative on file      ROS:  General: Negative for  fever, chills. See hpi. Energy improving.  Eyes: Negative for vision changes.  ENT: Negative for hoarseness, nasal congestion. CV: Negative for chest pain, angina, palpitations, dyspnea on exertion, peripheral edema.  Respiratory: Negative for dyspnea at rest, dyspnea on exertion, cough, sputum, wheezing.  GI: See history of present illness. GU:  Negative for dysuria, hematuria, urinary incontinence, urinary frequency, nocturnal urination.  MS: Negative for joint pain, low back pain.  Derm: Negative for rash or itching.  Neuro: Negative for weakness, abnormal sensation,  seizure, frequent headaches, memory loss, confusion.  Psych: Negative for anxiety, depression, suicidal ideation, hallucinations.  Endo: see hpi Heme: Negative for bruising or bleeding. Allergy: Negative for rash or hives.    Physical Examination:  BP (!) 149/93   Pulse (!) 108   Temp 98 F (36.7 C) (Oral)   Ht 6' (1.829 m)   Wt 236 lb 12.8 oz (107.4 kg)   BMI 32.12 kg/m    General: Well-nourished, well-developed in no acute distress.  Head: Normocephalic, atraumatic.   Eyes: Conjunctiva pink, no icterus. Mouth: Oropharyngeal mucosa moist and pink , no lesions erythema or exudate. Neck: Supple without thyromegaly, masses, or lymphadenopathy.  Lungs: Clear to auscultation bilaterally.  Heart: Regular rate and rhythm, no murmurs rubs or gallops.  Abdomen: Bowel sounds are normal, moderate epigastric tenderness, nondistended, no hepatosplenomegaly or masses, no abdominal bruits or    hernia , no rebound or guarding. Lipoma in left lower abd.  Rectal: not performed Extremities: No lower extremity edema. No clubbing or deformities.  Neuro: Alert and oriented x 4 , grossly normal neurologically.  Skin: Warm and dry, no rash or jaundice.   Psych: Alert and  cooperative, normal mood and affect.  Labs: No results found for: TSH Lab Results  Component Value Date   CREATININE 0.64 12/02/2016   BUN 8 12/02/2016   NA 135 12/02/2016   K 3.3 (L) 12/02/2016   CL 99 (L) 12/02/2016   CO2 28 12/02/2016   Lab Results  Component Value Date   ALT 20 12/02/2016   AST 26 12/02/2016   ALKPHOS 127 (H) 12/02/2016   BILITOT 0.7 12/02/2016   Lab Results  Component Value Date   WBC 6.6 12/02/2016   HGB 13.5 12/02/2016   HCT 41.4 12/02/2016   MCV 98.1 12/02/2016   PLT 176 12/02/2016   No results found for: FOLATE  Imaging Studies: Ct Chest W Contrast  Result Date: 11/15/2016 CLINICAL DATA:  Stable IIIa right lung non-small cell cancer. Initial diagnosis July 2017. Restaging. EXAM: CT CHEST, ABDOMEN, AND PELVIS WITH CONTRAST TECHNIQUE: Multidetector CT imaging of the chest, abdomen and pelvis was performed following the standard protocol during bolus administration of intravenous contrast. CONTRAST:  121m ISOVUE-300 IOPAMIDOL (ISOVUE-300) INJECTION 61% COMPARISON:  Abdominal CT 06/17/2016, PET-CT 07/01/2016 and CT simulator study 08/06/2016 FINDINGS: CT CHEST FINDINGS Cardiovascular: No definite acute vascular findings are seen. There is suboptimal vascular opacification. There is mild aortic atherosclerosis. The lower SVC is decompressed without evidence of intraluminal thrombus. No collateral vessels are seen to suggest SVC obstruction. Right IJ Port-A-Cath tip in the upper right atrium. The heart size is normal. There is no pericardial effusion. Mediastinum/Nodes: There are no residual enlarged mediastinal, hilar or axillary lymph nodes. Possible mild wall thickening of the midesophagus, likely treatment related. The thyroid gland and trachea demonstrate no significant findings. Lungs/Pleura: There is no pleural effusion. Interval partial re-expansion of the right middle lobe. There is underlying opacity and bronchiectasis posteriorly in the middle lobe.  There is no residual measurable mass or focal airspace disease posteriorly in the right lower lobe at the site of the previous dominant density. However, there are new ground-glass opacities throughout the right lower lobe. There is also focal ground-glass opacity anteriorly in the left upper lobe. There is stable chronic lung disease with subpleural reticulation and parenchymal scarring. Musculoskeletal/Chest wall: No chest wall mass or suspicious osseous findings. CT ABDOMEN AND PELVIS FINDINGS Hepatobiliary: Previously demonstrated hepatic steatosis has improved. No focal hepatic abnormalities or abnormal  enhancements) No evidence of gallstones, gallbladder wall thickening or biliary dilatation. Pancreas: Unremarkable. No pancreatic ductal dilatation or surrounding inflammatory changes. Spleen: Normal in size without focal abnormality. Adrenals/Urinary Tract: Both adrenal glands appear normal. The kidneys appear normal without evidence of urinary tract calculus, suspicious lesion or hydronephrosis. No bladder abnormalities are seen. Stomach/Bowel: No evidence of bowel wall thickening, distention or surrounding inflammatory change. Mild distal colonic diverticulosis. Vascular/Lymphatic: There are stable prominent lymph nodes within the upper abdomen, measuring up to 1.9 cm short axis in the portacaval space (image 66), not hypermetabolic on prior PET-CT. No progressive adenopathy seen. No significant vascular findings are present. Reproductive: The prostate gland and seminal vesicles appear stable without suspicious findings. Other: Stable asymmetric fat in the left inguinal canal. The anterior abdominal wall otherwise appears normal. No ascites or peritoneal nodularity. Musculoskeletal: No acute or significant osseous findings. Postsurgical changes from previous lower lumbar fusion. IMPRESSION: 1. Interval response to therapy. The previously demonstrated mediastinal and right hilar adenopathy and resulting right  middle lobe atelectasis have all improved. 2. No discrete residual lung masses are identified. There is new multifocal ground-glass opacity within the right lower lobe, and to a lesser extent in the left upper lobe. These are probably inflammatory/treatment related. 3. No evidence of abdominopelvic metastatic disease. Stable prominent lymph nodes in the upper abdomen, likely reactive. 4. Decompressed mid SVC without specific signs of SVC occlusion. Electronically Signed   By: Richardean Sale M.D.   On: 11/15/2016 15:06   Ct Abdomen Pelvis W Contrast  Result Date: 11/15/2016 CLINICAL DATA:  Stable IIIa right lung non-small cell cancer. Initial diagnosis July 2017. Restaging. EXAM: CT CHEST, ABDOMEN, AND PELVIS WITH CONTRAST TECHNIQUE: Multidetector CT imaging of the chest, abdomen and pelvis was performed following the standard protocol during bolus administration of intravenous contrast. CONTRAST:  172m ISOVUE-300 IOPAMIDOL (ISOVUE-300) INJECTION 61% COMPARISON:  Abdominal CT 06/17/2016, PET-CT 07/01/2016 and CT simulator study 08/06/2016 FINDINGS: CT CHEST FINDINGS Cardiovascular: No definite acute vascular findings are seen. There is suboptimal vascular opacification. There is mild aortic atherosclerosis. The lower SVC is decompressed without evidence of intraluminal thrombus. No collateral vessels are seen to suggest SVC obstruction. Right IJ Port-A-Cath tip in the upper right atrium. The heart size is normal. There is no pericardial effusion. Mediastinum/Nodes: There are no residual enlarged mediastinal, hilar or axillary lymph nodes. Possible mild wall thickening of the midesophagus, likely treatment related. The thyroid gland and trachea demonstrate no significant findings. Lungs/Pleura: There is no pleural effusion. Interval partial re-expansion of the right middle lobe. There is underlying opacity and bronchiectasis posteriorly in the middle lobe. There is no residual measurable mass or focal airspace  disease posteriorly in the right lower lobe at the site of the previous dominant density. However, there are new ground-glass opacities throughout the right lower lobe. There is also focal ground-glass opacity anteriorly in the left upper lobe. There is stable chronic lung disease with subpleural reticulation and parenchymal scarring. Musculoskeletal/Chest wall: No chest wall mass or suspicious osseous findings. CT ABDOMEN AND PELVIS FINDINGS Hepatobiliary: Previously demonstrated hepatic steatosis has improved. No focal hepatic abnormalities or abnormal enhancements) No evidence of gallstones, gallbladder wall thickening or biliary dilatation. Pancreas: Unremarkable. No pancreatic ductal dilatation or surrounding inflammatory changes. Spleen: Normal in size without focal abnormality. Adrenals/Urinary Tract: Both adrenal glands appear normal. The kidneys appear normal without evidence of urinary tract calculus, suspicious lesion or hydronephrosis. No bladder abnormalities are seen. Stomach/Bowel: No evidence of bowel wall thickening, distention or surrounding inflammatory  change. Mild distal colonic diverticulosis. Vascular/Lymphatic: There are stable prominent lymph nodes within the upper abdomen, measuring up to 1.9 cm short axis in the portacaval space (image 66), not hypermetabolic on prior PET-CT. No progressive adenopathy seen. No significant vascular findings are present. Reproductive: The prostate gland and seminal vesicles appear stable without suspicious findings. Other: Stable asymmetric fat in the left inguinal canal. The anterior abdominal wall otherwise appears normal. No ascites or peritoneal nodularity. Musculoskeletal: No acute or significant osseous findings. Postsurgical changes from previous lower lumbar fusion. IMPRESSION: 1. Interval response to therapy. The previously demonstrated mediastinal and right hilar adenopathy and resulting right middle lobe atelectasis have all improved. 2. No  discrete residual lung masses are identified. There is new multifocal ground-glass opacity within the right lower lobe, and to a lesser extent in the left upper lobe. These are probably inflammatory/treatment related. 3. No evidence of abdominopelvic metastatic disease. Stable prominent lymph nodes in the upper abdomen, likely reactive. 4. Decompressed mid SVC without specific signs of SVC occlusion. Electronically Signed   By: Richardean Sale M.D.   On: 11/15/2016 15:06

## 2016-12-10 NOTE — Patient Instructions (Signed)
1. Colonoscopy and upper endoscopy as scheduled. See separate instructions.  

## 2016-12-11 ENCOUNTER — Encounter (HOSPITAL_COMMUNITY): Payer: Self-pay | Admitting: Hematology & Oncology

## 2016-12-15 ENCOUNTER — Other Ambulatory Visit (HOSPITAL_COMMUNITY): Payer: Self-pay | Admitting: Hematology & Oncology

## 2016-12-15 DIAGNOSIS — K219 Gastro-esophageal reflux disease without esophagitis: Secondary | ICD-10-CM

## 2016-12-16 ENCOUNTER — Encounter (HOSPITAL_BASED_OUTPATIENT_CLINIC_OR_DEPARTMENT_OTHER): Payer: Medicare Other

## 2016-12-16 VITALS — BP 114/57 | HR 82 | Temp 98.7°F | Resp 18 | Wt 238.2 lb

## 2016-12-16 DIAGNOSIS — C3431 Malignant neoplasm of lower lobe, right bronchus or lung: Secondary | ICD-10-CM

## 2016-12-16 DIAGNOSIS — C3491 Malignant neoplasm of unspecified part of right bronchus or lung: Secondary | ICD-10-CM

## 2016-12-16 DIAGNOSIS — D751 Secondary polycythemia: Secondary | ICD-10-CM | POA: Diagnosis present

## 2016-12-16 DIAGNOSIS — Z5112 Encounter for antineoplastic immunotherapy: Secondary | ICD-10-CM

## 2016-12-16 LAB — COMPREHENSIVE METABOLIC PANEL
ALK PHOS: 151 U/L — AB (ref 38–126)
ALT: 24 U/L (ref 17–63)
AST: 29 U/L (ref 15–41)
Albumin: 2.8 g/dL — ABNORMAL LOW (ref 3.5–5.0)
Anion gap: 7 (ref 5–15)
BUN: 7 mg/dL (ref 6–20)
CALCIUM: 8.9 mg/dL (ref 8.9–10.3)
CHLORIDE: 97 mmol/L — AB (ref 101–111)
CO2: 30 mmol/L (ref 22–32)
CREATININE: 0.61 mg/dL (ref 0.61–1.24)
GFR calc Af Amer: >60 mL/min (ref >60)
GFR calc non Af Amer: >60 mL/min (ref >60)
Glucose, Bld: 166 mg/dL — ABNORMAL HIGH (ref 65–99)
Potassium: 3.7 mmol/L (ref 3.5–5.1)
Sodium: 134 mmol/L — ABNORMAL LOW (ref 135–145)
Total Bilirubin: 0.7 mg/dL (ref 0.3–1.2)
Total Protein: 7.2 g/dL (ref 6.5–8.1)

## 2016-12-16 LAB — CBC WITH DIFFERENTIAL/PLATELET
BASOS PCT: 0 %
Basophils Absolute: 0 10*3/uL (ref 0.0–0.1)
EOS ABS: 0.1 10*3/uL (ref 0.0–0.7)
EOS PCT: 1 %
HCT: 41.4 % (ref 39.0–52.0)
Hemoglobin: 13.4 g/dL (ref 13.0–17.0)
LYMPHS ABS: 0.9 10*3/uL (ref 0.7–4.0)
Lymphocytes Relative: 10 %
MCH: 31.2 pg (ref 26.0–34.0)
MCHC: 32.4 g/dL (ref 30.0–36.0)
MCV: 96.5 fL (ref 78.0–100.0)
MONO ABS: 0.9 10*3/uL (ref 0.1–1.0)
MONOS PCT: 10 %
Neutro Abs: 6.6 10*3/uL (ref 1.7–7.7)
Neutrophils Relative %: 79 %
PLATELETS: 175 10*3/uL (ref 150–400)
RBC: 4.29 MIL/uL (ref 4.22–5.81)
RDW: 14.5 % (ref 11.5–15.5)
WBC: 8.4 10*3/uL (ref 4.0–10.5)

## 2016-12-16 LAB — TSH: TSH: 0.914 u[IU]/mL (ref 0.350–4.500)

## 2016-12-16 LAB — FOLATE: Folate: 13.8 ng/mL (ref >5.9)

## 2016-12-16 LAB — VITAMIN B12: Vitamin B-12: 669 pg/mL (ref 180–914)

## 2016-12-16 MED ORDER — DIPHENHYDRAMINE HCL 50 MG/ML IJ SOLN: 50.0000 mg | Freq: Once | INTRAMUSCULAR | Status: DC | PRN

## 2016-12-16 MED ORDER — EPINEPHRINE PF 1 MG/10ML IJ SOSY: 0.2500 mg | PREFILLED_SYRINGE | Freq: Once | INTRAMUSCULAR | Status: DC | PRN

## 2016-12-16 MED ORDER — METHYLPREDNISOLONE SODIUM SUCC 125 MG IJ SOLR: 125.0000 mg | Freq: Once | INTRAMUSCULAR | Status: DC | PRN

## 2016-12-16 MED ORDER — ONDANSETRON HCL 40 MG/20ML IJ SOLN: Freq: Once | INTRAMUSCULAR | Status: DC

## 2016-12-16 MED ORDER — EPINEPHRINE PF 1 MG/ML IJ SOLN: 0.5000 mg | Freq: Once | INTRAMUSCULAR | Status: DC | PRN

## 2016-12-16 MED ORDER — ONDANSETRON 8 MG PO TBDP
8.0000 mg | ORAL_TABLET | Freq: Once | ORAL | Status: AC
  Administered 2016-12-16: 8 mg via ORAL
  Filled 2016-12-16: qty 1

## 2016-12-16 MED ORDER — SODIUM CHLORIDE 0.9% FLUSH
10.0000 mL | INTRAVENOUS | Status: DC | PRN
  Administered 2016-12-16: 10 mL via INTRAVENOUS
  Filled 2016-12-16: qty 10

## 2016-12-16 MED ORDER — HEPARIN SOD (PORK) LOCK FLUSH 100 UNIT/ML IV SOLN
500.0000 [IU] | Freq: Once | INTRAVENOUS | Status: AC
  Administered 2016-12-16: 500 [IU] via INTRAVENOUS

## 2016-12-16 MED ORDER — SODIUM CHLORIDE 0.9 % IV SOLN
Freq: Once | INTRAVENOUS | Status: AC | PRN
  Administered 2016-12-16: 13:00:00 via INTRAVENOUS

## 2016-12-16 MED ORDER — SODIUM CHLORIDE 0.9 % IV SOLN
10.0000 mg/kg | Freq: Once | INTRAVENOUS | Status: AC
  Administered 2016-12-16: 1100 mg via INTRAVENOUS
  Filled 2016-12-16: qty 2.4

## 2016-12-16 MED ORDER — ALBUTEROL SULFATE (2.5 MG/3ML) 0.083% IN NEBU: 2.5000 mg | INHALATION_SOLUTION | Freq: Once | RESPIRATORY_TRACT | Status: DC | PRN

## 2016-12-16 MED ORDER — FAMOTIDINE IN NACL 20-0.9 MG/50ML-% IV SOLN: 20.0000 mg | Freq: Once | INTRAVENOUS | Status: DC | PRN

## 2016-12-16 MED ORDER — DIPHENHYDRAMINE HCL 50 MG/ML IJ SOLN: 25.0000 mg | Freq: Once | INTRAMUSCULAR | Status: DC | PRN

## 2016-12-16 NOTE — Progress Notes (Signed)
Jenne Pane Beynon tolerated chemo tx well without complaints or incident. VSS upon discharge. Pt discharged self ambulatory in satisfactory condition

## 2016-12-16 NOTE — Patient Instructions (Signed)
[facility] Discharge Instructions for Patients Receiving Chemotherapy   Beginning January 23rd 2017 lab work for the [facility] will be done in the  Main lab at [facility] on 1st floor. If you have a lab appointment with the [facility] please come in thru the  Main Entrance and check in at the main information desk   Today you received the following chemotherapy agents Imfinzi. Follow-up as scheduled. Call clinic for any questions or concerns  To help prevent nausea and vomiting after your treatment, we encourage you to take your nausea medications   If you develop nausea and vomiting, or diarrhea that is not controlled by your medication, call the clinic.  The clinic phone number is [phone]. Office hours are Monday-Friday 8:30am-5:00pm.  BELOW ARE SYMPTOMS THAT SHOULD BE REPORTED IMMEDIATELY:  *FEVER GREATER THAN 101.0 F  *CHILLS WITH OR WITHOUT FEVER  NAUSEA AND VOMITING THAT IS NOT CONTROLLED WITH YOUR NAUSEA MEDICATION  *UNUSUAL SHORTNESS OF BREATH  *UNUSUAL BRUISING OR BLEEDING  TENDERNESS IN MOUTH AND THROAT WITH OR WITHOUT PRESENCE OF ULCERS  *URINARY PROBLEMS  *BOWEL PROBLEMS  UNUSUAL RASH Items with * indicate a potential emergency and should be followed up as soon as possible. If you have an emergency after office hours please contact your primary care physician or go to the nearest emergency department.  Please call the clinic during office hours if you have any questions or concerns.   You may also contact the Patient Navigator at [phone] should you have any questions or need assistance in obtaining follow up care.      Resources For Cancer Patients and their Caregivers ? American Cancer Society: Can assist with transportation, wigs, general needs, runs Look Good Feel Better.        828-787-2638 ? Cancer Care: Provides financial assistance, online support groups, medication/co-pay assistance.  1-800-813-HOPE  2251169644) ? [place] Assists [place] cancer patients and their families through emotional , educational and financial support.  (651)133-4109 ? [place] DSS Where to apply for food stamps, Medicaid and utility assistance. 313-510-7825 ? [facility]: Transportation to medical appointments. (580) 739-6967 ? Social Security Administration: May apply for disability if have a Stage IV cancer. (502)588-9981 (318)071-7153 ? [employer], Disability and Transit Services: Assists with nutrition, care and transit needs. [phone]

## 2016-12-17 ENCOUNTER — Ambulatory Visit (HOSPITAL_COMMUNITY): Payer: Medicare Other | Admitting: Oncology

## 2016-12-17 ENCOUNTER — Ambulatory Visit (HOSPITAL_COMMUNITY): Payer: Medicare Other

## 2016-12-20 ENCOUNTER — Other Ambulatory Visit (HOSPITAL_COMMUNITY): Payer: Self-pay | Admitting: *Deleted

## 2016-12-20 DIAGNOSIS — K219 Gastro-esophageal reflux disease without esophagitis: Secondary | ICD-10-CM

## 2016-12-20 MED ORDER — ESOMEPRAZOLE MAGNESIUM 40 MG PO CPDR: DELAYED_RELEASE_CAPSULE | ORAL | 0 refills | Status: DC

## 2016-12-27 NOTE — Patient Instructions (Signed)
Erik [name]  12/27/2016     '@PREFPERIOPPHARMACY'$ @   Your procedure is scheduled on 01/03/2017   Report to [facility] at  700  A.M.  Call this number if you have problems the morning of surgery:  [phone]   Remember:  Do not eat food or drink liquids after midnight.  Take these medicines the morning of surgery with A SIP OF WATER  Xanax, norvasc, lexapro, nexium, neurontin, methadone, zofran or compazine, zanaflex. Take your inhalers before you come and bring your rescue inhaler with you.   Do not wear jewelry, make-up or nail polish.  Do not wear lotions, powders, or perfumes, or deoderant.  Do not shave 48 hours prior to surgery.  Men may shave face and neck.  Do not bring valuables to the hospital.  [facility] is not responsible for any belongings or valuables.  Contacts, dentures or bridgework may not be worn into surgery.  Leave your suitcase in the car.  After surgery it may be brought to your room.  For patients admitted to the hospital, discharge time will be determined by your treatment team.  Patients discharged the day of surgery will not be allowed to drive home.   Name and phone number of your driver:   family Special instructions:  Follow the diet and prep instructions given to you by Dr Roseanne Kaufman office.  Please read over the following fact sheets that you were given. Anesthesia Post-op Instructions and Care and Recovery After Surgery       Esophagogastroduodenoscopy Introduction Esophagogastroduodenoscopy (EGD) is a procedure to examine the lining of the esophagus, stomach, and first part of the small intestine (duodenum). This procedure is done to check for problems such as inflammation, bleeding, ulcers, or growths. During this procedure, a long, flexible, lighted tube with a camera attached (endoscope) is inserted down the throat. Tell a health care provider about:  Any allergies you have.  All medicines you are taking,  including vitamins, herbs, eye drops, creams, and over-the-counter medicines.  Any problems you or family members have had with anesthetic medicines.  Any blood disorders you have.  Any surgeries you have had.  Any medical conditions you have.  Whether you are pregnant or may be pregnant. What are the risks? Generally, this is a safe procedure. However, problems may occur, including:  Infection.  Bleeding.  A tear (perforation) in the esophagus, stomach, or duodenum.  Trouble breathing.  Excessive sweating.  Spasms of the larynx.  A slowed heartbeat.  Low blood pressure. What happens before the procedure?  Follow instructions from your health care provider about eating or drinking restrictions.  Ask your health care provider about:  Changing or stopping your regular medicines. This is especially important if you are taking diabetes medicines or blood thinners.  Taking medicines such as aspirin and ibuprofen. These medicines can thin your blood. Do not take these medicines before your procedure if your health care provider instructs you not to.  Plan to have someone take you home after the procedure.  If you wear dentures, be ready to remove them before the procedure. What happens during the procedure?  To reduce your risk of infection, your health care team will wash or sanitize their hands.  An IV tube will be put in a vein in your hand or arm. You will get medicines and fluids through this tube.  You will be given one or  more of the following:  A medicine to help you relax (sedative).  A medicine to numb the area (local anesthetic). This medicine may be sprayed into your throat. It will make you feel more comfortable and keep you from gagging or coughing during the procedure.  A medicine for pain.  A mouth guard may be placed in your mouth to protect your teeth and to keep you from biting on the endoscope.  You will be asked to lie on your left side.  The  endoscope will be lowered down your throat into your esophagus, stomach, and duodenum.  Air will be put into the endoscope. This will help your health care provider see better.  The lining of your esophagus, stomach, and duodenum will be examined.  Your health care provider may:  Take a tissue sample so it can be looked at in a lab (biopsy).  Remove growths.  Remove objects (foreign bodies) that are stuck.  Treat any bleeding with medicines or other devices that stop tissue from bleeding.  Widen (dilate) or stretch narrowed areas of your esophagus and stomach.  The endoscope will be taken out. The procedure may vary among health care providers and hospitals. What happens after the procedure?  Your blood pressure, heart rate, breathing rate, and blood oxygen level will be monitored often until the medicines you were given have worn off.  Do not eat or drink anything until the numbing medicine has worn off and your gag reflex has returned. This information is not intended to replace advice given to you by your health care provider. Make sure you discuss any questions you have with your health care provider. Document Released: 03/25/2005 Document Revised: 04/29/2016 Document Reviewed: 10/16/2015  2017 Elsevier Esophagogastroduodenoscopy, Care After Introduction Refer to this sheet in the next few weeks. These instructions provide you with information about caring for yourself after your procedure. Your health care provider may also give you more specific instructions. Your treatment has been planned according to current medical practices, but problems sometimes occur. Call your health care provider if you have any problems or questions after your procedure. What can I expect after the procedure? After the procedure, it is common to have:  A sore throat.  Nausea.  Bloating.  Dizziness.  Fatigue. Follow these instructions at home:  Do not eat or drink anything until the  numbing medicine (local anesthetic) has worn off and your gag reflex has returned. You will know that the local anesthetic has worn off when you can swallow comfortably.  Do not drive for 24 hours if you received a medicine to help you relax (sedative).  If your health care provider took a tissue sample for testing during the procedure, make sure to get your test results. This is your responsibility. Ask your health care provider or the department performing the test when your results will be ready.  Keep all follow-up visits as told by your health care provider. This is important. Contact a health care provider if:  You cannot stop coughing.  You are not urinating.  You are urinating less than usual. Get help right away if:  You have trouble swallowing.  You cannot eat or drink.  You have throat or chest pain that gets worse.  You are dizzy or light-headed.  You faint.  You have nausea or vomiting.  You have chills.  You have a fever.  You have severe abdominal pain.  You have black, tarry, or bloody stools. This information is not intended to  replace advice given to you by your health care provider. Make sure you discuss any questions you have with your health care provider. Document Released: 11/08/2012 Document Revised: 04/29/2016 Document Reviewed: 10/16/2015  2017 Elsevier  Esophageal Dilatation Esophageal dilatation is a procedure to open a blocked or narrowed part of the esophagus. The esophagus is the long tube in your throat that carries food and liquid from your mouth to your stomach. The procedure is also called esophageal dilation. You may need this procedure if you have a buildup of scar tissue in your esophagus that makes it difficult, painful, or even impossible to swallow. This can be caused by gastroesophageal reflux disease (GERD). In rare cases, people need this procedure because they have cancer of the esophagus or a problem with the way food moves  through the esophagus. Sometimes you may need to have another dilatation to enlarge the opening of the esophagus gradually. Tell a health care provider about:  Any allergies you have.  All medicines you are taking, including vitamins, herbs, eye drops, creams, and over-the-counter medicines.  Any problems you or family members have had with anesthetic medicines.  Any blood disorders you have.  Any surgeries you have had.  Any medical conditions you have.  Any antibiotic medicines you are required to take before dental procedures. What are the risks? Generally, this is a safe procedure. However, problems can occur and include:  Bleeding from a tear in the lining of the esophagus.  A hole (perforation) in the esophagus. What happens before the procedure?  Do not eat or drink anything after midnight on the night before the procedure or as directed by your health care provider.  Ask your health care provider about changing or stopping your regular medicines. This is especially important if you are taking diabetes medicines or blood thinners.  Plan to have someone take you home after the procedure. What happens during the procedure?  You will be given a medicine that makes you relaxed and sleepy (sedative).  A medicine may be sprayed or gargled to numb the back of the throat.  Your health care provider can use various instruments to do an esophageal dilatation. During the procedure, the instrument used will be placed in your mouth and passed down into your esophagus. Options include:  Simple dilators. This instrument is carefully placed in the esophagus to stretch it.  Guided wire bougies. In this method, a flexible tube (endoscope) is used to insert a wire into the esophagus. The dilator is passed over this wire to enlarge the esophagus. Then the wire is removed.  Balloon dilators. An endoscope with a small balloon at the end is passed down into the esophagus. Inflating the  balloon gently stretches the esophagus and opens it up. What happens after the procedure?  Your blood pressure, heart rate, breathing rate, and blood oxygen level will be monitored often until the medicines you were given have worn off.  Your throat may feel slightly sore and will probably still feel numb. This will improve slowly over time.  You will not be allowed to eat or drink until the throat numbness has resolved.  If this is a same-day procedure, you may be allowed to go home once you have been able to drink, urinate, and sit on the edge of the bed without nausea or dizziness.  If this is a same-day procedure, you should have a friend or family member with you for the next 24 hours after the procedure. This information is not  intended to replace advice given to you by your health care provider. Make sure you discuss any questions you have with your health care provider. Document Released: 01/13/2006 Document Revised: 04/29/2016 Document Reviewed: 04/03/2014 Elsevier Interactive Patient Education  2017 [place].  Colonoscopy, Adult A colonoscopy is an exam to look at the entire large intestine. During the exam, a lubricated, bendable tube is inserted into the anus and then passed into the rectum, colon, and other parts of the large intestine. A colonoscopy is often done as a part of normal colorectal screening or in response to certain symptoms, such as anemia, persistent diarrhea, abdominal pain, and blood in the stool. The exam can help screen for and diagnose medical problems, including:  Tumors.  Polyps.  Inflammation.  Areas of bleeding. Tell a health care provider about:  Any allergies you have.  All medicines you are taking, including vitamins, herbs, eye drops, creams, and over-the-counter medicines.  Any problems you or family members have had with anesthetic medicines.  Any blood disorders you have.  Any surgeries you have had.  Any medical conditions you  have.  Any problems you have had passing stool. What are the risks? Generally, this is a safe procedure. However, problems may occur, including:  Bleeding.  A tear in the intestine.  A reaction to medicines given during the exam.  Infection (rare). What happens before the procedure? Eating and drinking restrictions  Follow instructions from your health care provider about eating and drinking, which may include:  A few days before the procedure - follow a low-fiber diet. Avoid nuts, seeds, dried fruit, raw fruits, and vegetables.  1-3 days before the procedure - follow a clear liquid diet. Drink only clear liquids, such as clear broth or bouillon, black coffee or tea, clear juice, clear soft drinks or sports drinks, gelatin desert, and popsicles. Avoid any liquids that contain red or purple dye.  On the day of the procedure - do not eat or drink anything during the 2 hours before the procedure, or within the time period that your health care provider recommends. Bowel prep  If you were prescribed an oral bowel prep to clean out your colon:  Take it as told by your health care provider. Starting the day before your procedure, you will need to drink a large amount of medicated liquid. The liquid will cause you to have multiple loose stools until your stool is almost clear or light green.  If your skin or anus gets irritated from diarrhea, you may use these to relieve the irritation:  Medicated wipes, such as adult wet wipes with aloe and vitamin E.  A skin soothing-product like petroleum jelly.  If you vomit while drinking the bowel prep, take a break for up to 60 minutes and then begin the bowel prep again. If vomiting continues and you cannot take the bowel prep without vomiting, call your health care provider. General instructions  Ask your health care provider about changing or stopping your regular medicines. This is especially important if you are taking diabetes medicines or  blood thinners.  Plan to have someone take you home from the hospital or clinic. What happens during the procedure?  An IV tube may be inserted into one of your veins.  You will be given medicine to help you relax (sedative).  To reduce your risk of infection:  Your health care team will wash or sanitize their hands.  Your anal area will be washed with soap.  You will be  asked to lie on your side with your knees bent.  Your health care provider will lubricate a long, thin, flexible tube. The tube will have a camera and a light on the end.  The tube will be inserted into your anus.  The tube will be gently eased through your rectum and colon.  Air will be delivered into your colon to keep it open. You may feel some pressure or cramping.  The camera will be used to take images during the procedure.  A small tissue sample may be removed from your body to be examined under a microscope (biopsy). If any potential problems are found, the tissue will be sent to a lab for testing.  If small polyps are found, your health care provider may remove them and have them checked for cancer cells.  The tube that was inserted into your anus will be slowly removed. The procedure may vary among health care providers and hospitals. What happens after the procedure?  Your blood pressure, heart rate, breathing rate, and blood oxygen level will be monitored until the medicines you were given have worn off.  Do not drive for 24 hours after the exam.  You may have a small amount of blood in your stool.  You may pass gas and have mild abdominal cramping or bloating due to the air that was used to inflate your colon during the exam.  It is up to you to get the results of your procedure. Ask your health care provider, or the department performing the procedure, when your results will be ready. This information is not intended to replace advice given to you by your health care provider. Make sure you  discuss any questions you have with your health care provider. Document Released: 11/19/2000 Document Revised: 06/11/2016 Document Reviewed: 02/03/2016 Elsevier Interactive Patient Education  2017 [employer]  Colonoscopy, Adult, Care After This sheet gives you information about how to care for yourself after your procedure. Your doctor may also give you more specific instructions. If you have problems or questions, call your doctor. Follow these instructions at home: General instructions  For the first 24 hours after the procedure:  Do not drive or use machinery.  Do not sign important documents.  Do not drink alcohol.  Do your daily activities more slowly than normal.  Eat foods that are soft and easy to digest.  Rest often.  Take over-the-counter or prescription medicines only as told by your doctor.  It is up to you to get the results of your procedure. Ask your doctor, or the department performing the procedure, when your results will be ready. To help cramping and bloating:  Try walking around.  Put heat on your belly (abdomen) as told by your doctor. Use a heat source that your doctor recommends, such as a moist heat pack or a heating pad.  Put a towel between your skin and the heat source.  Leave the heat on for 20-30 minutes.  Remove the heat if your skin turns bright red. This is especially important if you cannot feel pain, heat, or cold. You can get burned. Eating and drinking  Drink enough fluid to keep your pee (urine) clear or pale yellow.  Return to your normal diet as told by your doctor. Avoid heavy or fried foods that are hard to digest.  Avoid drinking alcohol for as long as told by your doctor. Contact a doctor if:  You have blood in your poop (stool) 2-3 days after the procedure.  Get help right away if:  You have more than a small amount of blood in your poop.  You see large clumps of tissue (blood clots) in your poop.  Your belly is  swollen.  You feel sick to your stomach (nauseous).  You throw up (vomit).  You have a fever.  You have belly pain that gets worse, and medicine does not help your pain. This information is not intended to replace advice given to you by your health care provider. Make sure you discuss any questions you have with your health care provider. Document Released: 12/25/2010 Document Revised: 08/16/2016 Document Reviewed: 08/16/2016 Elsevier Interactive Patient Education  2017 [place] Anesthesia is a term that refers to techniques, procedures, and medicines that help a person stay safe and comfortable during a medical procedure. Monitored anesthesia care, or sedation, is one type of anesthesia. Your anesthesia specialist may recommend sedation if you will be having a procedure that does not require you to be unconscious, such as:  Cataract surgery.  A dental procedure.  A biopsy.  A colonoscopy. During the procedure, you may receive a medicine to help you relax (sedative). There are three levels of sedation:  Mild sedation. At this level, you may feel awake and relaxed. You will be able to follow directions.  Moderate sedation. At this level, you will be sleepy. You may not remember the procedure.  Deep sedation. At this level, you will be asleep. You will not remember the procedure. The more medicine you are given, the deeper your level of sedation will be. Depending on how you respond to the procedure, the anesthesia specialist may change your level of sedation or the type of anesthesia to fit your needs. An anesthesia specialist will monitor you closely during the procedure. Let your health care provider know about:  Any allergies you have.  All medicines you are taking, including vitamins, herbs, eye drops, creams, and over-the-counter medicines.  Any use of steroids (by mouth or as a cream).  Any problems you or family members have had with  sedatives and anesthetic medicines.  Any blood disorders you have.  Any surgeries you have had.  Any medical conditions you have, such as sleep apnea.  Whether you are pregnant or may be pregnant.  Any use of cigarettes, alcohol, or street drugs. What are the risks? Generally, this is a safe procedure. However, problems may occur, including:  Getting too much medicine (oversedation).  Nausea.  Allergic reaction to medicines.  Trouble breathing. If this happens, a breathing tube may be used to help with breathing. It will be removed when you are awake and breathing on your own.  Heart trouble.  Lung trouble. Before the procedure Staying hydrated  Follow instructions from your health care provider about hydration, which may include:  Up to 2 hours before the procedure - you may continue to drink clear liquids, such as water, clear fruit juice, black coffee, and plain tea. Eating and drinking restrictions  Follow instructions from your health care provider about eating and drinking, which may include:  8 hours before the procedure - stop eating heavy meals or foods such as meat, fried foods, or fatty foods.  6 hours before the procedure - stop eating light meals or foods, such as toast or cereal.  6 hours before the procedure - stop drinking milk or drinks that contain milk.  2 hours before the procedure - stop drinking clear liquids. Medicines  Ask your health care provider  about:  Changing or stopping your regular medicines. This is especially important if you are taking diabetes medicines or blood thinners.  Taking medicines such as aspirin and ibuprofen. These medicines can thin your blood. Do not take these medicines before your procedure if your health care provider instructs you not to. Tests and exams  You will have a physical exam.  You may have blood tests done to show:  How well your kidneys and liver are working.  How well your blood can clot.  General  instructions  Plan to have someone take you home from the hospital or clinic.  If you will be going home right after the procedure, plan to have someone with you for 24 hours. What happens during the procedure?  Your blood pressure, heart rate, breathing, level of pain and overall condition will be monitored.  An IV tube will be inserted into one of your veins.  Your anesthesia specialist will give you medicines as needed to keep you comfortable during the procedure. This may mean changing the level of sedation.  The procedure will be performed. After the procedure  Your blood pressure, heart rate, breathing rate, and blood oxygen level will be monitored until the medicines you were given have worn off.  Do not drive for 24 hours if you received a sedative.  You may:  Feel sleepy, clumsy, or nauseous.  Feel forgetful about what happened after the procedure.  Have a sore throat if you had a breathing tube during the procedure.  Vomit. This information is not intended to replace advice given to you by your health care provider. Make sure you discuss any questions you have with your health care provider. Document Released: 08/18/2005 Document Revised: 04/30/2016 Document Reviewed: 03/14/2016 Elsevier Interactive Patient Education  2017 [place], Care After These instructions provide you with information about caring for yourself after your procedure. Your health care provider may also give you more specific instructions. Your treatment has been planned according to current medical practices, but problems sometimes occur. Call your health care provider if you have any problems or questions after your procedure. What can I expect after the procedure? After your procedure, it is common to:  Feel sleepy for several hours.  Feel clumsy and have poor balance for several hours.  Feel forgetful about what happened after the procedure.  Have poor  judgment for several hours.  Feel nauseous or vomit.  Have a sore throat if you had a breathing tube during the procedure. Follow these instructions at home: For at least 24 hours after the procedure:   Do not:  Participate in activities in which you could fall or become injured.  Drive.  Use heavy machinery.  Drink alcohol.  Take sleeping pills or medicines that cause drowsiness.  Make important decisions or sign legal documents.  Take care of children on your own.  Rest. Eating and drinking  Follow the diet that is recommended by your health care provider.  If you vomit, drink water, juice, or soup when you can drink without vomiting.  Make sure you have little or no nausea before eating solid foods. General instructions  Have a responsible adult stay with you until you are awake and alert.  Take over-the-counter and prescription medicines only as told by your health care provider.  If you smoke, do not smoke without supervision.  Keep all follow-up visits as told by your health care provider. This is important. Contact a health care provider if:  You keep feeling nauseous or you keep vomiting.  You feel light-headed.  You develop a rash.  You have a fever. Get help right away if:  You have trouble breathing. This information is not intended to replace advice given to you by your health care provider. Make sure you discuss any questions you have with your health care provider. Document Released: 03/14/2016 Document Revised: 07/14/2016 Document Reviewed: 03/14/2016 Elsevier Interactive Patient Education  2017 [employer].

## 2016-12-28 ENCOUNTER — Other Ambulatory Visit (HOSPITAL_COMMUNITY): Payer: Self-pay | Admitting: Oncology

## 2016-12-28 DIAGNOSIS — K219 Gastro-esophageal reflux disease without esophagitis: Secondary | ICD-10-CM

## 2016-12-28 MED ORDER — ESOMEPRAZOLE MAGNESIUM 40 MG PO CPDR: DELAYED_RELEASE_CAPSULE | ORAL | 0 refills | Status: DC

## 2016-12-29 ENCOUNTER — Encounter (HOSPITAL_COMMUNITY)
Admission: RE | Admit: 2016-12-29 | Discharge: 2016-12-29 | Disposition: A | Discharge status: Home / Self Care | Payer: Medicare Other | Source: Ambulatory Visit | Attending: Internal Medicine | Admitting: Internal Medicine

## 2016-12-29 ENCOUNTER — Encounter (HOSPITAL_COMMUNITY): Payer: Self-pay

## 2016-12-29 ENCOUNTER — Other Ambulatory Visit: Payer: Self-pay

## 2016-12-30 ENCOUNTER — Encounter (HOSPITAL_COMMUNITY): Payer: Self-pay | Admitting: Oncology

## 2016-12-30 ENCOUNTER — Encounter (HOSPITAL_BASED_OUTPATIENT_CLINIC_OR_DEPARTMENT_OTHER): Payer: Medicare Other

## 2016-12-30 ENCOUNTER — Encounter (HOSPITAL_BASED_OUTPATIENT_CLINIC_OR_DEPARTMENT_OTHER): Payer: Medicare Other | Admitting: Oncology

## 2016-12-30 VITALS — BP 140/79 | HR 76 | Temp 98.3°F | Resp 18 | Wt 232.6 lb

## 2016-12-30 DIAGNOSIS — Z5112 Encounter for antineoplastic immunotherapy: Secondary | ICD-10-CM | POA: Diagnosis not present

## 2016-12-30 DIAGNOSIS — Z95828 Presence of other vascular implants and grafts: Secondary | ICD-10-CM

## 2016-12-30 DIAGNOSIS — C3491 Malignant neoplasm of unspecified part of right bronchus or lung: Secondary | ICD-10-CM

## 2016-12-30 DIAGNOSIS — C342 Malignant neoplasm of middle lobe, bronchus or lung: Secondary | ICD-10-CM

## 2016-12-30 DIAGNOSIS — D751 Secondary polycythemia: Secondary | ICD-10-CM | POA: Diagnosis not present

## 2016-12-30 LAB — COMPREHENSIVE METABOLIC PANEL
ALK PHOS: 156 U/L — AB (ref 38–126)
ALT: 25 U/L (ref 17–63)
AST: 36 U/L (ref 15–41)
Albumin: 3 g/dL — ABNORMAL LOW (ref 3.5–5.0)
Anion gap: 7 (ref 5–15)
BUN: 8 mg/dL (ref 6–20)
CALCIUM: 9 mg/dL (ref 8.9–10.3)
CO2: 30 mmol/L (ref 22–32)
CREATININE: 0.71 mg/dL (ref 0.61–1.24)
Chloride: 99 mmol/L — ABNORMAL LOW (ref 101–111)
Glucose, Bld: 130 mg/dL — ABNORMAL HIGH (ref 65–99)
Potassium: 3.7 mmol/L (ref 3.5–5.1)
Sodium: 136 mmol/L (ref 135–145)
Total Bilirubin: 0.7 mg/dL (ref 0.3–1.2)
Total Protein: 7.5 g/dL (ref 6.5–8.1)

## 2016-12-30 LAB — CBC WITH DIFFERENTIAL/PLATELET
Basophils Absolute: 0 10*3/uL (ref 0.0–0.1)
Basophils Relative: 0 %
EOS PCT: 1 %
Eosinophils Absolute: 0.1 10*3/uL (ref 0.0–0.7)
HCT: 44.8 % (ref 39.0–52.0)
HEMOGLOBIN: 14.7 g/dL (ref 13.0–17.0)
LYMPHS ABS: 0.7 10*3/uL (ref 0.7–4.0)
LYMPHS PCT: 9 %
MCH: 30.6 pg (ref 26.0–34.0)
MCHC: 32.8 g/dL (ref 30.0–36.0)
MCV: 93.1 fL (ref 78.0–100.0)
MONOS PCT: 9 %
Monocytes Absolute: 0.7 10*3/uL (ref 0.1–1.0)
Neutro Abs: 6.5 10*3/uL (ref 1.7–7.7)
Neutrophils Relative %: 81 %
PLATELETS: 178 10*3/uL (ref 150–400)
RBC: 4.81 MIL/uL (ref 4.22–5.81)
RDW: 14.7 % (ref 11.5–15.5)
WBC: 8.1 10*3/uL (ref 4.0–10.5)

## 2016-12-30 LAB — TSH: TSH: 0.893 u[IU]/mL (ref 0.350–4.500)

## 2016-12-30 MED ORDER — ONDANSETRON 8 MG PO TBDP
8.0000 mg | ORAL_TABLET | Freq: Once | ORAL | Status: AC
  Administered 2016-12-30: 8 mg via ORAL
  Filled 2016-12-30: qty 1

## 2016-12-30 MED ORDER — HEPARIN SOD (PORK) LOCK FLUSH 100 UNIT/ML IV SOLN
500.0000 [IU] | Freq: Once | INTRAVENOUS | Status: AC
  Administered 2016-12-30: 500 [IU] via INTRAVENOUS

## 2016-12-30 MED ORDER — ONDANSETRON HCL 40 MG/20ML IJ SOLN: Freq: Once | INTRAMUSCULAR | Status: DC

## 2016-12-30 MED ORDER — HEPARIN SOD (PORK) LOCK FLUSH 100 UNIT/ML IV SOLN
INTRAVENOUS | Status: AC
  Filled 2016-12-30: qty 5

## 2016-12-30 MED ORDER — SODIUM CHLORIDE 0.9 % IV SOLN
10.2000 mg/kg | Freq: Once | INTRAVENOUS | Status: AC
  Administered 2016-12-30: 1100 mg via INTRAVENOUS
  Filled 2016-12-30: qty 2.4

## 2016-12-30 NOTE — Progress Notes (Signed)
Erik Gravel, MD [place] [address]  Adenocarcinoma of right lung [facility]) - Plan: SCHEDULING COMMUNICATION, VITAL SIGNS, durvalumab (IMFINZI) 1,100 mg in sodium chloride 0.9 % 100 mL chemo infusion, VITAL SIGNS, ondansetron (ZOFRAN-ODT) disintegrating tablet 8 mg, DISCONTINUED: ondansetron (ZOFRAN) 8 mg in sodium chloride 0.9 % 50 mL IVPB  CURRENT THERAPY: Durvalumab every 2 weeks x 1 year beginning on 12/02/2016.  INTERVAL HISTORY: Erik [name] 56 y.o. 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 S/P concomitant chemoXRT with Cisplatin/Etoposide (08/17/2016- 09/20/2016).  Now on immunotherapy for up to 1 year in accordance with NCCN guidelines.    Adenocarcinoma of right lung ([place])   07/01/2016 PET scan    6.3 x 3.7 cm right lower lobe mass, suspicious for primary bronchogenic neoplasm, as described above. Hypermetabolic thoracic nodal metastases, as above. Additional right perihilar hypermetabolism, indeterminate. Associated right middle lobe atelectasis/collapse.      07/19/2016 Procedure    Bronchoscopy with brushings and biopsies and endobronchial ultrasound with mediastinal lymph node aspirations by Dr. Roxan Hockey      07/21/2016 Pathology Results    Lung, biopsy, Right Middle Lobe - LUNG TISSUE WITH SQUAMOUS METAPLASIA. - NO MALIGNANCY IDENTIFIED.      07/21/2016 Pathology Results    FINE NEEDLE ASPIRATION, ENDOSCOPIC (A) LEVEL 7 (SPECIMEN 1 OF 3, COLLECTED ON 07/19/16): MALIGNANT CELLS CONSISTENT WITH ADENOCARCINOMA.      07/21/2016 Pathology Results    FINE NEEDLE ASPIRATION, EBUS, 4R, B (SPECIMEN 2 OF 3, COLLECTED 07/19/16): MALIGNANT CELLS CONSISTENT WITH ADENOCARCINOMA.      07/21/2016 Pathology Results    FINE NEEDLE ASPIRATION, EBUS, BRUSHING, RIGHT MIDDLE LOBE, D (SPECIMEN 3 OF 3, COLLECTED 07/19/16): MALIGNANT CELLS CONSISTENT WITH ADENOCARCINOMA.      07/22/2016 Imaging    MRI brain- No evidence of  intracranial metastases.      08/17/2016 - 09/20/2016 Chemotherapy    The patient had palonosetron (ALOXI) injection 0.25 mg, 0.25 mg, Intravenous,  Once, 1 of 1 cycle  CISplatin (PLATINOL) 123 mg in sodium chloride 0.9 % 500 mL chemo infusion, 50 mg/m2 = 123 mg, Intravenous,  Once, 1 of 1 cycle  etoposide (VEPESID) 120 mg in sodium chloride 0.9 % 500 mL chemo infusion, 50 mg/m2 = 120 mg, Intravenous,  Once, 1 of 1 cycle  fosaprepitant (EMEND) 150 mg, dexamethasone (DECADRON) 12 mg in sodium chloride 0.9 % 145 mL IVPB, , Intravenous,  Once, 1 of 1 cycle  ondansetron (ZOFRAN) 8 mg in sodium chloride 0.9 % 50 mL IVPB, , Intravenous,  Once, 2 of 6 cycles  for chemotherapy treatment.        08/17/2016 -  Radiation Therapy         11/15/2016 Imaging    CT CAP- Interval response to therapy. The previously demonstrated mediastinal and right hilar adenopathy and resulting right middle lobe atelectasis have all improved. 2. No discrete residual lung masses are identified. There is new multifocal ground-glass opacity within the right lower lobe, and to a lesser extent in the left upper lobe. These are probably inflammatory/treatment related. 3. No evidence of abdominopelvic metastatic disease. Stable prominent lymph nodes in the upper abdomen, likely reactive. 4. Decompressed mid SVC without specific signs of SVC occlusion.      12/02/2016 -  Chemotherapy    Imfinzi (durvalumab) immunotherapy every 2 weeks x up to 1 year       He is tolerating immunotherapy without any difficulties.  He denies any fatigue/tiredness or diarrhea.    He has met with GI.  He is scheduled for EGD/Colonoscopy on Monday.  EGD is being performed for ongoing chest discomfort, weight loss, and heart burn.  Colonoscopy will be first screening colonoscopy.  He continues with a non-productive cough.  This is improved.  Review of Systems  Constitutional: Negative.  Negative for chills and fever.  HENT: Negative.    Eyes: Negative.   Respiratory: Positive for cough. Negative for sputum production.   Cardiovascular: Positive for chest pain (improved).  Gastrointestinal: Positive for heartburn (persistent). Negative for blood in stool, constipation, diarrhea, nausea and vomiting.  Genitourinary: Negative.   Musculoskeletal: Negative.   Skin: Negative.   Neurological: Negative.  Negative for weakness.  Psychiatric/Behavioral: Negative.     Past Medical History:  Diagnosis Date  . Arnold-Chiari syndrome ([place])   . Arthritis   . Chronic back pain   . Depression   . GERD (gastroesophageal reflux disease)   . Seasonal allergies   . Spinal stenosis of lumbar region   . 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 Wheezing     Past Surgical History:  Procedure Laterality Date  . BACK SURGERY     5 total  . MULTIPLE EXTRACTIONS WITH ALVEOLOPLASTY N/A 07/08/2014   Procedure: MULTIPLE EXTRACION WITH ALVEOLOPLASTY with EXCISION LESION RIGHT SIDE OF TONGUE;  Surgeon: Gae Bon, DDS;  Location: [facility];  Service: Oral Surgery;  Laterality: N/A;  . PORTACATH PLACEMENT Right 07/30/2016   Procedure: INSERTION PORT-A-CATH;  Surgeon: Vickie Epley, MD;  Location: [facility];  Service: Vascular;  Laterality: Right;  [name] VIDEO BRONCHOSCOPY WITH ENDOBRONCHIAL ULTRASOUND N/A 07/19/2016   Procedure: VIDEO BRONCHOSCOPY WITH ENDOBRONCHIAL ULTRASOUND;  Surgeon: [name], MD;  Location[facility];  Service: Thoracic;  Laterality: N/A;    Family History  Problem Relation Age of Onset  . Cancer Mother     breast cancer  . Heart failure Brother   . Colon cancer Neg Hx     not sure, ?grandfather and/or uncle    Social History   Social History  . Marital status: Divorced    Spouse name: N/A  . Number of children: N/A  . Years of education: N/A   Social History Main Topics  . Smoking status: Current Every Day Smoker    Packs/day: 0.25    Years: 38.00    Types: Cigarettes  . Smokeless  tobacco: Former Systems developer    Types: Snuff     Comment: using chantix- smoking once in a while  . Alcohol use 1.8 oz/week    3 Cans of beer per week     Comment: reports drinking 2-3 beers a week  . Drug use: Yes    Types: Hydrocodone  . Sexual activity: Not Currently   Other Topics Concern  . None   Social History Narrative  . None     PHYSICAL EXAMINATION  ECOG PERFORMANCE STATUS: 1 - Symptomatic but completely ambulatory  There were no vitals filed for this visit.  Vitals - 1 value per visit 08/03/9370  SYSTOLIC 696  DIASTOLIC 79  Pulse 76  Temperature 98.3  Respirations 18  Weight (lb) 232.6    GENERAL:alert, no distress, well nourished, well developed, comfortable, cooperative, obese, smiling and unaccompanied, in chemo-recliner. SKIN: skin color, texture, turgor are normal, no rashes or significant lesions HEAD: Normocephalic, No masses, lesions, tenderness or abnormalities EYES: normal, EOMI, Conjunctiva are pink and non-injected EARS: External ears  normal OROPHARYNX:lips, buccal mucosa, and tongue normal and mucous membranes are moist  NECK: supple, trachea midline LYMPH:  no palpable lymphadenopathy BREAST:not examined LUNGS: clear to auscultation , decreased breath sounds builaterally HEART: regular rate & rhythm, no murmurs and no gallops ABDOMEN:abdomen soft, non-tender and normal bowel sounds BACK: Back symmetric, no curvature. EXTREMITIES:less then 2 second capillary refill, no joint deformities, effusion, or inflammation, no skin discoloration, no cyanosis  NEURO: alert & oriented x 3 with fluent speech, no focal motor/sensory deficits, gait normal   LABORATORY DATA: CBC    Component Value Date/Time   WBC 8.1 12/30/2016 1146   RBC 4.81 12/30/2016 1146   HGB 14.7 12/30/2016 1146   HCT 44.8 12/30/2016 1146   PLT 178 12/30/2016 1146   MCV 93.1 12/30/2016 1146   MCH 30.6 12/30/2016 1146   MCHC 32.8 12/30/2016 1146   RDW 14.7 12/30/2016 1146   LYMPHSABS  0.7 12/30/2016 1146   MONOABS 0.7 12/30/2016 1146   EOSABS 0.1 12/30/2016 1146   BASOSABS 0.0 12/30/2016 1146      Chemistry      Component Value Date/Time   NA 136 12/30/2016 1146   K 3.7 12/30/2016 1146   CL 99 (L) 12/30/2016 1146   CO2 30 12/30/2016 1146   BUN 8 12/30/2016 1146   CREATININE 0.71 12/30/2016 1146      Component Value Date/Time   CALCIUM 9.0 12/30/2016 1146   ALKPHOS 156 (H) 12/30/2016 1146   AST 36 12/30/2016 1146   ALT 25 12/30/2016 1146   BILITOT 0.7 12/30/2016 1146        PENDING LABS:   RADIOGRAPHIC STUDIES:  No results found.   PATHOLOGY:    ASSESSMENT AND PLAN:  Adenocarcinoma of right lung (HCC) Stage IIIA (T3N2M0) adenocarcinoma of right lung.  S/P biopsy by Dr. Roxan Hockey on 07/19/2016 proving Stage III disease.  Concomitant chemoXRT with Cisplatin/Etoposide finishing on 09/20/2016 with curative intent.  Now on immunotherapy durvalumab every 2 weeks beginning on 12/02/2016 which will be given up to 1 year.  Oncology history is updated.    Pre-treatment labs: CBC diff, CMET, TSH as ordered.  I personally reviewed and went over laboratory results with the patient.  The results are noted within this dictation.  Smoking cessation education is provided today.  He notes that he is down to < 1 ppd (previously smoking 2 + ppd prior to Dx).  CT chest is ordered and scheduled in March 2018 by Dr. Tammi Klippel (Radiation Oncology).  He has seen GI and they are planning on EGD/colonoscopy in the future.  He reports that it is scheduled for Monday.  I have provided him some nice little tips in preparation for his colonoscopy.  He was appreciative.  He is strongly urged to follow GI instruction for a good prep.  Treatment every 2 weeks.  Return in 4 weeks for follow-up.   ORDERS PLACED FOR THIS ENCOUNTER: Orders Placed This Encounter  Procedures  . SCHEDULING COMMUNICATION  . VITAL SIGNS    MEDICATIONS PRESCRIBED THIS ENCOUNTER: Meds ordered  this encounter  Medications  . DISCONTD: ondansetron (ZOFRAN) 8 mg in sodium chloride 0.9 % 50 mL IVPB  . durvalumab (IMFINZI) 1,100 mg in sodium chloride 0.9 % 100 mL chemo infusion  . ondansetron (ZOFRAN-ODT) disintegrating tablet 8 mg    THERAPY PLAN:  Continue immunotherapy as outlined for 1 year.  All questions were answered. The patient knows to call the clinic with any problems, questions or concerns. We can certainly see  the patient much sooner if necessary.  Patient and plan discussed with Dr. Ancil Linsey and she is in agreement with the aforementioned.   This note is electronically signed by: Doy Mince 12/30/2016 6:20 PM

## 2016-12-30 NOTE — Patient Instructions (Addendum)
[place] at [facility] Discharge Instructions  RECOMMENDATIONS MADE BY THE CONSULTANT AND ANY TEST RESULTS WILL BE SENT TO YOUR REFERRING PHYSICIAN.  You were seen today by [name] PA-C. Return in two weeks for treatment. Return in 4 weeks for follow up with the provider. Colonoscopy and EGD on Monday as scheduled.     Thank you for choosing [place] at [facility] to provide your oncology and hematology care.  To afford each patient quality time with our provider, please arrive at least 15 minutes before your scheduled appointment time.    If you have a lab appointment with the [facility] please come in thru the  Main Entrance and check in at the main information desk  You need to re-schedule your appointment should you arrive 10 or more minutes late.  We strive to give you quality time with our providers, and arriving late affects you and other patients whose appointments are after yours.  Also, if you no show three or more times for appointments you may be dismissed from the clinic at the providers discretion.     Again, thank you for choosing [facility].  Our hope is that these requests will decrease the amount of time that you wait before being seen by our physicians.       _____________________________________________________________  Should you have questions after your visit to [facility], please contact our office at [phone] between the hours of 8:30 a.m. and 4:30 p.m.  Voicemails left after 4:30 p.m. will not be returned until the following business day.  For prescription refill requests, have your pharmacy contact our office.       Resources For Cancer Patients and their Caregivers ? American Cancer Society: Can assist with transportation, wigs, general needs, runs Look Good Feel Better.        (807)118-2371 ? Cancer Care: Provides financial assistance, online support groups,  medication/co-pay assistance.  1-800-813-HOPE (586)825-3625) ? [facility] Assists [place] cancer patients and their families through emotional , educational and financial support.  808 643 3557 ? [place] DSS Where to apply for food stamps, Medicaid and utility assistance. 434-886-4338 ? [facility]: Transportation to medical appointments. 337-077-7386 ? Social Security Administration: May apply for disability if have a Stage IV cancer. 4380211744 (501)229-7804 ? [employer], Disability and Transit Services: Assists with nutrition, care and transit needs. [place] Support Programs: '@10RELATIVEDAYS'$ @ > Cancer Support Group  2nd Tuesday of the month 1pm-2pm, Journey Room  > Creative Journey  3rd Tuesday of the month 1130am-1pm, Journey Room  > Look Good Feel Better  1st Wednesday of the month 10am-12 noon, Journey Room (Call [facility] to register [phone])

## 2016-12-30 NOTE — Assessment & Plan Note (Addendum)
Stage IIIA (T3N2M0) adenocarcinoma of right lung.  S/P biopsy by Dr. Roxan Hockey on 07/19/2016 proving Stage III disease.  Concomitant chemoXRT with Cisplatin/Etoposide finishing on 09/20/2016 with curative intent.  Now on immunotherapy durvalumab every 2 weeks beginning on 12/02/2016 which will be given up to 1 year.  Oncology history is updated.    Pre-treatment labs: CBC diff, CMET, TSH as ordered.  I personally reviewed and went over laboratory results with the patient.  The results are noted within this dictation.  Smoking cessation education is provided today.  He notes that he is down to < 1 ppd (previously smoking 2 + ppd prior to Dx).  CT chest is ordered and scheduled in March 2018 by Dr. Tammi Klippel (Radiation Oncology).  He has seen GI and they are planning on EGD/colonoscopy in the future.  He reports that it is scheduled for Monday.  I have provided him some nice little tips in preparation for his colonoscopy.  He was appreciative.  He is strongly urged to follow GI instruction for a good prep.  Treatment every 2 weeks.  Return in 4 weeks for follow-up.

## 2016-12-30 NOTE — Progress Notes (Signed)
Tolerated chemo well. Stable and ambulatory on discharge home to self. 

## 2017-01-03 ENCOUNTER — Encounter (HOSPITAL_COMMUNITY): Payer: Self-pay

## 2017-01-03 ENCOUNTER — Ambulatory Visit (HOSPITAL_COMMUNITY): Payer: Medicare Other | Admitting: Anesthesiology

## 2017-01-03 ENCOUNTER — Ambulatory Visit (HOSPITAL_COMMUNITY)
Admission: RE | Admit: 2017-01-03 | Discharge: 2017-01-03 | Disposition: A | Discharge status: Home / Self Care | Payer: Medicare Other | Source: Ambulatory Visit | Attending: Internal Medicine | Admitting: Internal Medicine

## 2017-01-03 ENCOUNTER — Encounter (HOSPITAL_COMMUNITY)
Admission: RE | Disposition: A | Discharge status: Home / Self Care | Payer: Self-pay | Source: Ambulatory Visit | Attending: Internal Medicine

## 2017-01-03 DIAGNOSIS — Z79899 Other long term (current) drug therapy: Secondary | ICD-10-CM | POA: Insufficient documentation

## 2017-01-03 DIAGNOSIS — C7801 Secondary malignant neoplasm of right lung: Secondary | ICD-10-CM | POA: Diagnosis not present

## 2017-01-03 DIAGNOSIS — K209 Esophagitis, unspecified: Secondary | ICD-10-CM | POA: Diagnosis not present

## 2017-01-03 DIAGNOSIS — K210 Gastro-esophageal reflux disease with esophagitis: Secondary | ICD-10-CM | POA: Insufficient documentation

## 2017-01-03 DIAGNOSIS — K3189 Other diseases of stomach and duodenum: Secondary | ICD-10-CM

## 2017-01-03 DIAGNOSIS — M48061 Spinal stenosis, lumbar region without neurogenic claudication: Secondary | ICD-10-CM | POA: Diagnosis not present

## 2017-01-03 DIAGNOSIS — F329 Major depressive disorder, single episode, unspecified: Secondary | ICD-10-CM | POA: Insufficient documentation

## 2017-01-03 DIAGNOSIS — F17210 Nicotine dependence, cigarettes, uncomplicated: Secondary | ICD-10-CM | POA: Diagnosis not present

## 2017-01-03 DIAGNOSIS — R1310 Dysphagia, unspecified: Secondary | ICD-10-CM | POA: Insufficient documentation

## 2017-01-03 DIAGNOSIS — Z955 Presence of coronary angioplasty implant and graft: Secondary | ICD-10-CM | POA: Diagnosis not present

## 2017-01-03 DIAGNOSIS — Z86718 Personal history of other venous thrombosis and embolism: Secondary | ICD-10-CM | POA: Diagnosis not present

## 2017-01-03 DIAGNOSIS — M1990 Unspecified osteoarthritis, unspecified site: Secondary | ICD-10-CM | POA: Diagnosis not present

## 2017-01-03 DIAGNOSIS — I10 Essential (primary) hypertension: Secondary | ICD-10-CM | POA: Diagnosis not present

## 2017-01-03 DIAGNOSIS — G8929 Other chronic pain: Secondary | ICD-10-CM | POA: Insufficient documentation

## 2017-01-03 DIAGNOSIS — F419 Anxiety disorder, unspecified: Secondary | ICD-10-CM | POA: Diagnosis not present

## 2017-01-03 DIAGNOSIS — Z1211 Encounter for screening for malignant neoplasm of colon: Secondary | ICD-10-CM

## 2017-01-03 HISTORY — PX: ESOPHAGOGASTRODUODENOSCOPY (EGD) WITH PROPOFOL: SHX5813

## 2017-01-03 HISTORY — PX: BIOPSY: SHX5522

## 2017-01-03 HISTORY — PX: MALONEY DILATION: SHX5535

## 2017-01-03 SURGERY — ESOPHAGOGASTRODUODENOSCOPY (EGD) WITH PROPOFOL
Anesthesia: Monitor Anesthesia Care

## 2017-01-03 MED ORDER — LIDOCAINE VISCOUS 2 % MT SOLN
OROMUCOSAL | Status: AC
  Filled 2017-01-03: qty 15

## 2017-01-03 MED ORDER — MIDAZOLAM HCL 2 MG/2ML IJ SOLN
INTRAMUSCULAR | Status: AC
  Filled 2017-01-03: qty 2

## 2017-01-03 MED ORDER — PROPOFOL 10 MG/ML IV BOLUS
INTRAVENOUS | Status: DC | PRN
  Administered 2017-01-03: 20 mg via INTRAVENOUS
  Administered 2017-01-03: 10 mg via INTRAVENOUS
  Administered 2017-01-03: 20 mg via INTRAVENOUS
  Administered 2017-01-03: 10 mg via INTRAVENOUS
  Administered 2017-01-03: 20 mg via INTRAVENOUS

## 2017-01-03 MED ORDER — PROPOFOL 10 MG/ML IV BOLUS
INTRAVENOUS | Status: AC
  Filled 2017-01-03: qty 40

## 2017-01-03 MED ORDER — LIDOCAINE VISCOUS 2 % MT SOLN
5.0000 mL | Freq: Once | OROMUCOSAL | Status: AC
  Administered 2017-01-03: 5 mL via OROMUCOSAL

## 2017-01-03 MED ORDER — CHLORHEXIDINE GLUCONATE CLOTH 2 % EX PADS: 6.0000 | MEDICATED_PAD | Freq: Once | CUTANEOUS | Status: DC

## 2017-01-03 MED ORDER — LACTATED RINGERS IV SOLN
INTRAVENOUS | Status: DC
  Administered 2017-01-03: 1000 mL via INTRAVENOUS

## 2017-01-03 MED ORDER — FENTANYL CITRATE (PF) 100 MCG/2ML IJ SOLN
INTRAMUSCULAR | Status: AC
  Filled 2017-01-03: qty 2

## 2017-01-03 MED ORDER — FENTANYL CITRATE (PF) 100 MCG/2ML IJ SOLN
25.0000 ug | INTRAMUSCULAR | Status: AC | PRN
  Administered 2017-01-03 (×2): 25 ug via INTRAVENOUS

## 2017-01-03 MED ORDER — PROPOFOL 500 MG/50ML IV EMUL
INTRAVENOUS | Status: DC | PRN
  Administered 2017-01-03: 125 ug/kg/min via INTRAVENOUS

## 2017-01-03 MED ORDER — MIDAZOLAM HCL 2 MG/2ML IJ SOLN
0.5000 mg | INTRAMUSCULAR | Status: DC | PRN
  Administered 2017-01-03: 2 mg via INTRAVENOUS

## 2017-01-03 NOTE — Interval H&P Note (Signed)
History and Physical Interval Note:  01/03/2017 8:17 AM  Erik [name]  has presented today for surgery, with the diagnosis of SCREENING  The various methods of treatment have been discussed with the patient and family. After consideration of risks, benefits and other options for treatment, the patient has consented to  Procedure(s) with comments: COLONOSCOPY WITH PROPOFOL (N/A) - 830  ESOPHAGOGASTRODUODENOSCOPY (EGD) WITH PROPOFOL (N/A) MALONEY DILATION (N/A) as a surgical intervention .  The patient's history has been reviewed, patient examined, no change in status, stable for surgery.  I have reviewed the patient's chart and labs.  Questions were answered to the patient's satisfaction.     Erik [name]  No change. Patient "forgot" to take other half colonoscopy prep this morning.  Consequently, we'll plan for EGD/EGD as feasible/appropriate only today. The risks, benefits, limitations, alternatives and imponderables have been reviewed with the patient. Potential for esophageal dilation, biopsy, etc. have also been reviewed.  Questions have been answered. All parties agreeable.

## 2017-01-03 NOTE — Discharge Instructions (Signed)
Esophagogastroduodenoscopy, Care After Introduction Refer to this sheet in the next few weeks. These instructions provide you with information about caring for yourself after your procedure. Your health care provider may also give you more specific instructions. Your treatment has been planned according to current medical practices, but problems sometimes occur. Call your health care provider if you have any problems or questions after your procedure. What can I expect after the procedure? After the procedure, it is common to have:  A sore throat.  Nausea.  Bloating.  Dizziness.  Fatigue. Follow these instructions at home:  Do not eat or drink anything until the numbing medicine (local anesthetic) has worn off and your gag reflex has returned. You will know that the local anesthetic has worn off when you can swallow comfortably.  Do not drive for 24 hours if you received a medicine to help you relax (sedative).  If your health care provider took a tissue sample for testing during the procedure, make sure to get your test results. This is your responsibility. Ask your health care provider or the department performing the test when your results will be ready.  Keep all follow-up visits as told by your health care provider. This is important. Contact a health care provider if:  You cannot stop coughing.  You are not urinating.  You are urinating less than usual. Get help right away if:  You have trouble swallowing.  You cannot eat or drink.  You have throat or chest pain that gets worse.  You are dizzy or light-headed.  You faint.  You have nausea or vomiting.  You have chills.  You have a fever.  You have severe abdominal pain.  You have black, tarry, or bloody stools. This information is not intended to replace advice given to you by your health care provider. Make sure you discuss any questions you have with your health care provider. Document Released:  11/08/2012 Document Revised: 04/29/2016 Document Reviewed: 10/16/2015  2017 Else[employer]   EGD Discharge instructions Please read the instructions outlined below and refer to this sheet in the next few weeks. These discharge instructions provide you with general information on caring for yourself after you leave the hospital. Your doctor may also give you specific instructions. While your treatment has been planned according to the most current medical practices available, unavoidable complications occasionally occur. If you have any problems or questions after discharge, please call your doctor. ACTIVITY  You may resume your regular activity but move at a slower pace for the next 24 hours.   Take frequent rest periods for the next 24 hours.   Walking will help expel (get rid of) the air and reduce the bloated feeling in your abdomen.   No driving for 24 hours (because of the anesthesia (medicine) used during the test).   You may shower.   Do not sign any important legal documents or operate any machinery for 24 hours (because of the anesthesia used during the test).  NUTRITION  Drink plenty of fluids.   You may resume your normal diet.   Begin with a light meal and progress to your normal diet.   Avoid alcoholic beverages for 24 hours or as instructed by your caregiver.  MEDICATIONS  You may resume your normal medications unless your caregiver tells you otherwise.  WHAT YOU CAN EXPECT TODAY  You may experience abdominal discomfort such as a feeling of fullness or gas pains.  FOLLOW-UP  Your doctor will discuss the results of your  test with you.  SEEK IMMEDIATE MEDICAL ATTENTION IF ANY OF THE FOLLOWING OCCUR:  Excessive nausea (feeling sick to your stomach) and/or vomiting.   Severe abdominal pain and distention (swelling).   Trouble swallowing.   Temperature over 101 F (37.8 C).   Rectal bleeding or vomiting of blood.     Continue Nexium 40 mg daily  Begin  Carafate suspension 1 g 4 meals and at bedtime  Further recommendations to follow pending review of pathology report  Office visit with [place] in one month to reassess and talk about rescheduling a colonoscopy

## 2017-01-03 NOTE — Op Note (Signed)
[facility] Patient Name: Erik [name] Procedure Date: 01/03/2017 8:27 AM MRN: [id number] Date of Birth: 30-Jan-1961 Attending MD: Norvel Richards , MD CSN: [id number] Age: 56 Admit Type: Outpatient Procedure:                Upper GI endoscopy with [facility] dilation and                            gastric biopsy Indications:              Dysphagia Providers:                Norvel Richards, MD, Lurline Del, RN, Purcell Nails.                            [place], [employer] Referring MD:              Medicines:                Propofol per Anesthesia Complications:            No immediate complications. Estimated Blood Loss:     Estimated blood loss was minimal. Procedure:                Pre-Anesthesia Assessment:                           - Prior to the procedure, a History and Physical                            was performed, and patient medications and                            allergies were reviewed. The patient's tolerance of                            previous anesthesia was also reviewed. The risks                            and benefits of the procedure and the sedation                            options and risks were discussed with the patient.                            All questions were answered, and informed consent                            was obtained. Prior Anticoagulants: The patient has                            taken no previous anticoagulant or antiplatelet                            agents. ASA Grade Assessment: III - A patient with  severe systemic disease. After reviewing the risks                            and benefits, the patient was deemed in                            satisfactory condition to undergo the procedure.                           After obtaining informed consent, the endoscope was                            passed under direct vision. Throughout the                            procedure, the patient's blood  pressure, pulse, and                            oxygen saturations were monitored continuously. The                            EG-[id number] ([id number]) scope was introduced through the                            and advanced to the second part of duodenum. The                            patient tolerated the procedure fairly well. Scope In: 8:36:15 AM Scope Out: 8:50:33 AM Total Procedure Duration: 0 hours 14 minutes 18 seconds  Findings:      Esophagitis was found - focally about 10 centimeters in length -       proximal esophagus. lumen appeared patent throughout its course.      Diffuse erythematous mucosa was found in the entire examined stomach.      The duodenal bulb and second portion of the duodenum were normal.      The exam was otherwise without abnormality.The scope was withdrawn.       Dilation was performed with a Maloney dilator with mild resistance at 10       Fr. The dilation site was examined and showed mild improvement in       luminal narrowing. Estimated blood loss was minimal. This was biopsied       with a cold forceps for histology after dilation. 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                           - Erythematous mucosa in the stomach. Biopsied.                           - Normal duodenal bulb and second portion of the                            duodenum.                           -  The examination was otherwise normal. No                            colonoscopy today because patient "foregot" to take                            the other half of this preparation Moderate Sedation:      Moderate (conscious) sedation was personally administered by an       anesthesia professional. The following parameters were monitored: oxygen       saturation, heart rate, blood pressure, respiratory rate, EKG, adequacy       of pulmonary ventilation, and response to care. Total physician       intraservice time was 24  minutes. Recommendation:           - Patient has a contact number available for                            emergencies. The signs and symptoms of potential                            delayed complications were discussed with the                            patient. Return to normal activities tomorrow.                            Written discharge instructions were provided to the                            patient.                           - Resume previous diet.                           - Continue present medications. Continue Nexium 40                            mg daily. Will add Carafate suspension 1 g 4 times                            a day                           - No repeat upper endoscopy.                           - Return to GI office in 4 weeks. We'll discuss                            rescheduling a colonoscopy at that time Procedure Code(s):        --- Professional ---                           [phone], Esophagogastroduodenoscopy, flexible,  transoral; with biopsy, single or multiple                           43450, Dilation of esophagus, by unguided sound or                            bougie, single or multiple passes Diagnosis Code(s):        --- Professional ---                           K20.9, Esophagitis, unspecified                           K31.89, Other diseases of stomach and duodenum                           R13.10, Dysphagia, unspecified CPT copyright 2016 [employer]. All rights reserved. The codes documented in this report are preliminary and upon coder review may  be revised to meet current compliance requirements. [name]. [name], MD Norvel Richards, MD 01/03/2017 9:04:12 AM This report has been signed electronically. Number of Addenda: 0

## 2017-01-03 NOTE — Transfer of Care (Signed)
Immediate Anesthesia Transfer of Care Note  Patient: Erik [name]  Procedure(s) Performed: Procedure(s) with comments: ESOPHAGOGASTRODUODENOSCOPY (EGD) WITH PROPOFOL (N/A) MALONEY DILATION (N/A) BIOPSY - gastric  Patient Location: PACU  Anesthesia Type:MAC  Level of Consciousness: awake, alert  and oriented  Airway & Oxygen Therapy: Patient Spontanous Breathing and Patient connected to nasal cannula oxygen  Post-op Assessment: Report given to RN  Post vital signs: Reviewed and stable  Last Vitals:  Vitals:   01/03/17 0810 01/03/17 0815  BP: 127/80   Pulse:    Resp: 16 16  Temp:      Last Pain:  Vitals:   01/03/17 0724  TempSrc: Oral  PainSc: 3       Patients Stated Pain Goal: 7 (49/17/91 5056)  Complications: No apparent anesthesia complications

## 2017-01-03 NOTE — Anesthesia Postprocedure Evaluation (Signed)
Anesthesia Post Note  Patient: Erik [name]  Procedure(s) Performed: Procedure(s) (LRB): ESOPHAGOGASTRODUODENOSCOPY (EGD) WITH PROPOFOL (N/A) MALONEY DILATION (N/A) BIOPSY  Patient location during evaluation: PACU Anesthesia Type: MAC Level of consciousness: awake and alert Pain management: pain level controlled Vital Signs Assessment: post-procedure vital signs reviewed and stable Respiratory status: spontaneous breathing Cardiovascular status: blood pressure returned to baseline Postop Assessment: no signs of nausea or vomiting Anesthetic complications: no     Last Vitals:  Vitals:   01/03/17 0859 01/03/17 0900  BP: 122/74 122/74  Pulse: (!) 101 99  Resp: 18   Temp:      Last Pain:  Vitals:   01/03/17 0859  TempSrc:   PainSc: Asleep                 [name]

## 2017-01-03 NOTE — Anesthesia Preprocedure Evaluation (Addendum)
Anesthesia Evaluation  Patient identified by MRN, date of birth, ID band Patient awake    Reviewed: Allergy & Precautions, NPO status , Patient's Chart, lab work & pertinent test results  History of Anesthesia Complications Negative for: history of anesthetic complications  Airway Mallampati: III  TM Distance: >3 FB Neck ROM: Full    Dental  (+) Partial Upper, Dental Advisory Given   Pulmonary neg shortness of breath, asthma (used albuterol this morning) , neg sleep apnea, neg recent URI, Current Smoker,  Squamous cell carcinoma of right lung    Pulmonary exam normal breath sounds clear to auscultation       Cardiovascular hypertension, Pt. on medications (-) angina(-) Past MI, (-) Cardiac Stents, (-) CABG, (-) Orthopnea and (-) PND negative cardio ROS  Dysrhythmias: PVCs.  Rhythm:Regular Rate:Normal     Neuro/Psych neg Seizures PSYCHIATRIC DISORDERS Anxiety Depression Arnold-Chiari syndrome  negative neurological ROS     GI/Hepatic negative GI ROS, Neg liver ROS, GERD  Controlled and Medicated,  Endo/Other  Obesity  Renal/GU negative Renal ROS  negative genitourinary   Musculoskeletal  (+) Arthritis , Lumbar spinal stenosis   Abdominal (+) + obese,   Peds  Hematology negative hematology ROS (+)   Anesthesia Other Findings   Reproductive/Obstetrics                             Anesthesia Physical Anesthesia Plan  ASA: III  Anesthesia Plan: MAC   Post-op Pain Management:    Induction: Intravenous  Airway Management Planned: Simple Face Mask  Additional Equipment:   Intra-op Plan:   Post-operative Plan:   Informed Consent: I have reviewed the patients History and Physical, chart, labs and discussed the procedure including the risks, benefits and alternatives for the proposed anesthesia with the patient or authorized representative who has indicated his/her understanding and  acceptance.     Plan Discussed with:   Anesthesia Plan Comments:         Anesthesia Quick Evaluation

## 2017-01-03 NOTE — H&P (View-Only) (Signed)
Primary Care Physician:  [name], MD  Primary Gastroenterologist:  Garfield Cornea, MD   Chief Complaint  Patient presents with  . Gastroesophageal Reflux    since being on chemo (6-8 months)  . Weight Loss    states back to baseline and he is happy with his weight    HPI:  [name] is a 56 y.o. male here For further evaluation of GERD, unintentional weight loss. Patient has a history of stage IIIa adenocarcinoma right lung. Status post concomitant chemotherapy XRT and cisplatin/etoposide. Completed these therapies 09/20/2016. Now on immunotherapy (durvalumab) for up to one year, receiving injection every 2 weeks. Most recent imaging CT chest/abdomen/pelvis. Possible mild wall thickening in the midesophagus? Treatment related.Interval response to therapy with improvement of mediastinal and right hilar adenopathy, no discrete residual lung masses, no abdominal pelvic metastatic disease.  Patient states that he is pretty much had reflux symptoms since he started XRT/chemotherapy. Did have some odynophagia initially. Continues to have intermittent dysphagia, frequent heartburn which is been difficult to manage. He is on Nexium twice a day, also takes over-the-counter agent such as Tagamet. He has been on lidocaine, magic mouthwash, Carafate but none recently. Patient complains of regurgitation at times. He does have intermittent vomiting. Overall his appetite is slightly improved since he's been off chemotherapy however he notes when he goes to eat he loses his appetite pretty quickly. He's lost 30 pounds since September. He mentions that he is happy with his weight loss however. No prior EGD or colonoscopy. Currently really denies any abdominal pain although he states his stomach feels tight in knots little bit after he has immunotherapy injections. No melena rectal bleeding. He has daily bowel movements.    Current Outpatient Prescriptions  Medication Sig Dispense Refill  . albuterol  (PROVENTIL HFA;VENTOLIN HFA) 108 (90 BASE) MCG/ACT inhaler Inhale 2 puffs into the lungs every 6 (six) hours as needed for wheezing.     [name] ALPRAZolam (XANAX) 1 MG tablet Take 1 mg by mouth 2 (two) times daily as needed for anxiety.     [name] amLODipine (NORVASC) 5 MG tablet Take 5 mg by mouth daily.     . budesonide-formoterol (SYMBICORT) 160-4.5 MCG/ACT inhaler Inhale 2 puffs into the lungs daily.    Hunt Oris IV Inject into the vein. Every 2 weeks    . escitalopram (LEXAPRO) 20 MG tablet Take 20 mg by mouth every morning.     [name] esomeprazole (NEXIUM) 40 MG capsule TAKE (1) CAPSULE BY MOUTH TWICE DAILY BEFORE A MEAL. 60 capsule 0  . gabapentin (NEURONTIN) 300 MG capsule Take 600 mg by mouth 3 (three) times daily.     [name] lidocaine-prilocaine (EMLA) cream Apply a quarter size amount to port site 1 hour prior to chemo. Do not rub in. Cover with plastic wrap. 30 g 2  . methadone (DOLOPHINE) 10 MG tablet Take 10 mg by mouth every 5 (five) hours as needed.     . nystatin (MYCOSTATIN) 100000 UNIT/ML suspension     . ondansetron (ZOFRAN) 8 MG tablet Take 1 tablet (8 mg total) by mouth every 8 (eight) hours as needed for nausea or vomiting. 30 tablet 2  . prochlorperazine (COMPAZINE) 10 MG tablet TAKE 1 TABLET EVERY 6 HOURS AS NEEDED FOR NAUSEA AND VOMITING. 30 tablet 1  . tiZANidine (ZANAFLEX) 4 MG tablet Take 4 mg by mouth 3 (three) times daily.     . varenicline (CHANTIX) 1 MG tablet Take 1 mg by mouth 2 (two) times  daily.    . lidocaine (XYLOCAINE) 2 % solution     . magic mouthwash w/lidocaine SOLN 1 part of each: Diphenhydramine 12.5 mg /5 mL, Viscous lidocaine 2% and Maalox. Swish and swallow 5 mL Q 4 hours. (Patient not taking: Reported on 12/10/2016) 240 mL 1  . predniSONE (DELTASONE) 20 MG tablet Take at the onset of diarrhea.  Take 5 tablets (100 mg) at one time.  Then call the [facility]. (Patient not taking: Reported on 12/10/2016) 30 tablet 0   No current facility-administered medications for  this visit.     Allergies as of 12/10/2016 - Review Complete 12/10/2016  Allergen Reaction Noted  . Tetracyclines & related Other (See Comments) 09/25/2011    Past Medical History:  Diagnosis Date  . Arnold-Chiari syndrome ([place])   . Arthritis   . Chronic back pain   . Depression   . GERD (gastroesophageal reflux disease)   . Seasonal allergies   . Spinal stenosis of lumbar region   . 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 Wheezing     Past Surgical History:  Procedure Laterality Date  . BACK SURGERY     5 total  . MULTIPLE EXTRACTIONS WITH ALVEOLOPLASTY N/A 07/08/2014   Procedure: MULTIPLE EXTRACION WITH ALVEOLOPLASTY with EXCISION LESION RIGHT SIDE OF TONGUE;  Surgeon: Gae Bon, DDS;  Location: [facility];  Service: Oral Surgery;  Laterality: N/A;  . PORTACATH PLACEMENT Right 07/30/2016   Procedure: INSERTION PORT-A-CATH;  Surgeon: Vickie Epley, MD;  Location: [facility];  Service: Vascular;  Laterality: Right;  [name] VIDEO BRONCHOSCOPY WITH ENDOBRONCHIAL ULTRASOUND N/A 07/19/2016   Procedure: VIDEO BRONCHOSCOPY WITH ENDOBRONCHIAL ULTRASOUND;  Surgeon: [name], MD;  Location: [facility];  Service: Thoracic;  Laterality: N/A;    Family History  Problem Relation Age of Onset  . Cancer Mother     breast cancer  . Heart failure Brother   . Colon cancer Neg Hx     not sure, ?grandfather and/or uncle    Social History   Social History  . Marital status: Divorced    Spouse name: N/A  . Number of children: N/A  . Years of education: N/A   Occupational History  . Not on file.   Social History Main Topics  . Smoking status: Current Every Day Smoker    Packs/day: 2.00    Years: 38.00    Types: Cigarettes  . Smokeless tobacco: Former Systems developer    Types: Snuff     Comment: using chantix- smoking once in a while  . Alcohol use 1.8 oz/week    3 Cans of beer per week     Comment: reports drinking 2-3 beers a week  . Drug use:     Types: Hydrocodone  .  Sexual activity: Not Currently   Other Topics Concern  . Not on file   Social History Narrative  . No narrative on file      ROS:  General: Negative for  fever, chills. See hpi. Energy improving.  Eyes: Negative for vision changes.  ENT: Negative for hoarseness, nasal congestion. CV: Negative for chest pain, angina, palpitations, dyspnea on exertion, peripheral edema.  Respiratory: Negative for dyspnea at rest, dyspnea on exertion, cough, sputum, wheezing.  GI: See history of present illness. GU:  Negative for dysuria, hematuria, urinary incontinence, urinary frequency, nocturnal urination.  MS: Negative for joint pain, low back pain.  Derm: Negative for rash or itching.  Neuro: Negative for weakness, abnormal sensation,  seizure, frequent headaches, memory loss, confusion.  Psych: Negative for anxiety, depression, suicidal ideation, hallucinations.  Endo: see hpi Heme: Negative for bruising or bleeding. Allergy: Negative for rash or hives.    Physical Examination:  BP (!) 149/93   Pulse (!) 108   Temp 98 F (36.7 C) (Oral)   Ht 6' (1.829 m)   Wt 236 lb 12.8 oz (107.4 kg)   BMI 32.12 kg/m    General: Well-nourished, well-developed in no acute distress.  Head: Normocephalic, atraumatic.   Eyes: Conjunctiva pink, no icterus. Mouth: Oropharyngeal mucosa moist and pink , no lesions erythema or exudate. Neck: Supple without thyromegaly, masses, or lymphadenopathy.  Lungs: Clear to auscultation bilaterally.  Heart: Regular rate and rhythm, no murmurs rubs or gallops.  Abdomen: Bowel sounds are normal, moderate epigastric tenderness, nondistended, no hepatosplenomegaly or masses, no abdominal bruits or    hernia , no rebound or guarding. Lipoma in left lower abd.  Rectal: not performed Extremities: No lower extremity edema. No clubbing or deformities.  Neuro: Alert and oriented x 4 , grossly normal neurologically.  Skin: Warm and dry, no rash or jaundice.   Psych: Alert and  cooperative, normal mood and affect.  Labs: No results found for: TSH Lab Results  Component Value Date   CREATININE 0.64 12/02/2016   BUN 8 12/02/2016   NA 135 12/02/2016   K 3.3 (L) 12/02/2016   CL 99 (L) 12/02/2016   CO2 28 12/02/2016   Lab Results  Component Value Date   ALT 20 12/02/2016   AST 26 12/02/2016   ALKPHOS 127 (H) 12/02/2016   BILITOT 0.7 12/02/2016   Lab Results  Component Value Date   WBC 6.6 12/02/2016   HGB 13.5 12/02/2016   HCT 41.4 12/02/2016   MCV 98.1 12/02/2016   PLT 176 12/02/2016   No results found for: FOLATE  Imaging Studies: Ct Chest W Contrast  Result Date: 11/15/2016 CLINICAL DATA:  Stable IIIa right lung non-small cell cancer. Initial diagnosis July 2017. Restaging. EXAM: CT CHEST, ABDOMEN, AND PELVIS WITH CONTRAST TECHNIQUE: Multidetector CT imaging of the chest, abdomen and pelvis was performed following the standard protocol during bolus administration of intravenous contrast. CONTRAST:  151m ISOVUE-300 IOPAMIDOL (ISOVUE-300) INJECTION 61% COMPARISON:  Abdominal CT 06/17/2016, PET-CT 07/01/2016 and CT simulator study 08/06/2016 FINDINGS: CT CHEST FINDINGS Cardiovascular: No definite acute vascular findings are seen. There is suboptimal vascular opacification. There is mild aortic atherosclerosis. The lower SVC is decompressed without evidence of intraluminal thrombus. No collateral vessels are seen to suggest SVC obstruction. Right IJ Port-A-Cath tip in the upper right atrium. The heart size is normal. There is no pericardial effusion. Mediastinum/Nodes: There are no residual enlarged mediastinal, hilar or axillary lymph nodes. Possible mild wall thickening of the midesophagus, likely treatment related. The thyroid gland and trachea demonstrate no significant findings. Lungs/Pleura: There is no pleural effusion. Interval partial re-expansion of the right middle lobe. There is underlying opacity and bronchiectasis posteriorly in the middle lobe.  There is no residual measurable mass or focal airspace disease posteriorly in the right lower lobe at the site of the previous dominant density. However, there are new ground-glass opacities throughout the right lower lobe. There is also focal ground-glass opacity anteriorly in the left upper lobe. There is stable chronic lung disease with subpleural reticulation and parenchymal scarring. Musculoskeletal/Chest wall: No chest wall mass or suspicious osseous findings. CT ABDOMEN AND PELVIS FINDINGS Hepatobiliary: Previously demonstrated hepatic steatosis has improved. No focal hepatic abnormalities or abnormal  enhancements) No evidence of gallstones, gallbladder wall thickening or biliary dilatation. Pancreas: Unremarkable. No pancreatic ductal dilatation or surrounding inflammatory changes. Spleen: Normal in size without focal abnormality. Adrenals/Urinary Tract: Both adrenal glands appear normal. The kidneys appear normal without evidence of urinary tract calculus, suspicious lesion or hydronephrosis. No bladder abnormalities are seen. Stomach/Bowel: No evidence of bowel wall thickening, distention or surrounding inflammatory change. Mild distal colonic diverticulosis. Vascular/Lymphatic: There are stable prominent lymph nodes within the upper abdomen, measuring up to 1.9 cm short axis in the portacaval space (image 66), not hypermetabolic on prior PET-CT. No progressive adenopathy seen. No significant vascular findings are present. Reproductive: The prostate gland and seminal vesicles appear stable without suspicious findings. Other: Stable asymmetric fat in the left inguinal canal. The anterior abdominal wall otherwise appears normal. No ascites or peritoneal nodularity. Musculoskeletal: No acute or significant osseous findings. Postsurgical changes from previous lower lumbar fusion. IMPRESSION: 1. Interval response to therapy. The previously demonstrated mediastinal and right hilar adenopathy and resulting right  middle lobe atelectasis have all improved. 2. No discrete residual lung masses are identified. There is new multifocal ground-glass opacity within the right lower lobe, and to a lesser extent in the left upper lobe. These are probably inflammatory/treatment related. 3. No evidence of abdominopelvic metastatic disease. Stable prominent lymph nodes in the upper abdomen, likely reactive. 4. Decompressed mid SVC without specific signs of SVC occlusion. Electronically Signed   By: Richardean Sale M.D.   On: 11/15/2016 15:06   Ct Abdomen Pelvis W Contrast  Result Date: 11/15/2016 CLINICAL DATA:  Stable IIIa right lung non-small cell cancer. Initial diagnosis July 2017. Restaging. EXAM: CT CHEST, ABDOMEN, AND PELVIS WITH CONTRAST TECHNIQUE: Multidetector CT imaging of the chest, abdomen and pelvis was performed following the standard protocol during bolus administration of intravenous contrast. CONTRAST:  157m ISOVUE-300 IOPAMIDOL (ISOVUE-300) INJECTION 61% COMPARISON:  Abdominal CT 06/17/2016, PET-CT 07/01/2016 and CT simulator study 08/06/2016 FINDINGS: CT CHEST FINDINGS Cardiovascular: No definite acute vascular findings are seen. There is suboptimal vascular opacification. There is mild aortic atherosclerosis. The lower SVC is decompressed without evidence of intraluminal thrombus. No collateral vessels are seen to suggest SVC obstruction. Right IJ Port-A-Cath tip in the upper right atrium. The heart size is normal. There is no pericardial effusion. Mediastinum/Nodes: There are no residual enlarged mediastinal, hilar or axillary lymph nodes. Possible mild wall thickening of the midesophagus, likely treatment related. The thyroid gland and trachea demonstrate no significant findings. Lungs/Pleura: There is no pleural effusion. Interval partial re-expansion of the right middle lobe. There is underlying opacity and bronchiectasis posteriorly in the middle lobe. There is no residual measurable mass or focal airspace  disease posteriorly in the right lower lobe at the site of the previous dominant density. However, there are new ground-glass opacities throughout the right lower lobe. There is also focal ground-glass opacity anteriorly in the left upper lobe. There is stable chronic lung disease with subpleural reticulation and parenchymal scarring. Musculoskeletal/Chest wall: No chest wall mass or suspicious osseous findings. CT ABDOMEN AND PELVIS FINDINGS Hepatobiliary: Previously demonstrated hepatic steatosis has improved. No focal hepatic abnormalities or abnormal enhancements) No evidence of gallstones, gallbladder wall thickening or biliary dilatation. Pancreas: Unremarkable. No pancreatic ductal dilatation or surrounding inflammatory changes. Spleen: Normal in size without focal abnormality. Adrenals/Urinary Tract: Both adrenal glands appear normal. The kidneys appear normal without evidence of urinary tract calculus, suspicious lesion or hydronephrosis. No bladder abnormalities are seen. Stomach/Bowel: No evidence of bowel wall thickening, distention or surrounding inflammatory  change. Mild distal colonic diverticulosis. Vascular/Lymphatic: There are stable prominent lymph nodes within the upper abdomen, measuring up to 1.9 cm short axis in the portacaval space (image 66), not hypermetabolic on prior PET-CT. No progressive adenopathy seen. No significant vascular findings are present. Reproductive: The prostate gland and seminal vesicles appear stable without suspicious findings. Other: Stable asymmetric fat in the left inguinal canal. The anterior abdominal wall otherwise appears normal. No ascites or peritoneal nodularity. Musculoskeletal: No acute or significant osseous findings. Postsurgical changes from previous lower lumbar fusion. IMPRESSION: 1. Interval response to therapy. The previously demonstrated mediastinal and right hilar adenopathy and resulting right middle lobe atelectasis have all improved. 2. No  discrete residual lung masses are identified. There is new multifocal ground-glass opacity within the right lower lobe, and to a lesser extent in the left upper lobe. These are probably inflammatory/treatment related. 3. No evidence of abdominopelvic metastatic disease. Stable prominent lymph nodes in the upper abdomen, likely reactive. 4. Decompressed mid SVC without specific signs of SVC occlusion. Electronically Signed   By: Richardean Sale M.D.   On: 11/15/2016 15:06

## 2017-01-04 ENCOUNTER — Other Ambulatory Visit (HOSPITAL_COMMUNITY): Payer: Self-pay | Admitting: Oncology

## 2017-01-04 ENCOUNTER — Encounter (HOSPITAL_COMMUNITY): Payer: Self-pay | Admitting: Internal Medicine

## 2017-01-06 ENCOUNTER — Encounter: Payer: Self-pay | Admitting: Internal Medicine

## 2017-01-07 ENCOUNTER — Other Ambulatory Visit (HOSPITAL_COMMUNITY): Payer: Self-pay | Admitting: Pharmacist

## 2017-01-11 ENCOUNTER — Other Ambulatory Visit (HOSPITAL_COMMUNITY): Payer: Self-pay | Admitting: Oncology

## 2017-01-11 DIAGNOSIS — C3491 Malignant neoplasm of unspecified part of right bronchus or lung: Secondary | ICD-10-CM

## 2017-01-13 ENCOUNTER — Encounter (HOSPITAL_COMMUNITY): Payer: Medicare Other | Attending: Hematology

## 2017-01-13 ENCOUNTER — Encounter (HOSPITAL_COMMUNITY): Payer: Self-pay

## 2017-01-13 ENCOUNTER — Ambulatory Visit (HOSPITAL_COMMUNITY): Payer: Medicare Other | Admitting: Hematology & Oncology

## 2017-01-13 VITALS — BP 138/80 | HR 77 | Temp 98.8°F | Resp 18 | Wt 236.0 lb

## 2017-01-13 DIAGNOSIS — D751 Secondary polycythemia: Secondary | ICD-10-CM | POA: Insufficient documentation

## 2017-01-13 DIAGNOSIS — C3491 Malignant neoplasm of unspecified part of right bronchus or lung: Secondary | ICD-10-CM

## 2017-01-13 DIAGNOSIS — C342 Malignant neoplasm of middle lobe, bronchus or lung: Secondary | ICD-10-CM

## 2017-01-13 DIAGNOSIS — Z5112 Encounter for antineoplastic immunotherapy: Secondary | ICD-10-CM

## 2017-01-13 LAB — COMPREHENSIVE METABOLIC PANEL
ALT: 25 U/L (ref 17–63)
AST: 34 U/L (ref 15–41)
Albumin: 3.1 g/dL — ABNORMAL LOW (ref 3.5–5.0)
Alkaline Phosphatase: 136 U/L — ABNORMAL HIGH (ref 38–126)
Anion gap: 7 (ref 5–15)
BUN: 8 mg/dL (ref 6–20)
CO2: 32 mmol/L (ref 22–32)
CREATININE: 0.64 mg/dL (ref 0.61–1.24)
Calcium: 8.7 mg/dL — ABNORMAL LOW (ref 8.9–10.3)
Chloride: 101 mmol/L (ref 101–111)
GFR calc non Af Amer: >60 mL/min (ref >60)
Glucose, Bld: 122 mg/dL — ABNORMAL HIGH (ref 65–99)
Potassium: 3.5 mmol/L (ref 3.5–5.1)
Sodium: 140 mmol/L (ref 135–145)
Total Bilirubin: 0.5 mg/dL (ref 0.3–1.2)
Total Protein: 7 g/dL (ref 6.5–8.1)

## 2017-01-13 LAB — CBC WITH DIFFERENTIAL/PLATELET
Basophils Absolute: 0 K/uL (ref 0.0–0.1)
Basophils Relative: 0 %
Eosinophils Absolute: 0.1 K/uL (ref 0.0–0.7)
Eosinophils Relative: 2 %
HCT: 42.7 % (ref 39.0–52.0)
Hemoglobin: 13.9 g/dL (ref 13.0–17.0)
Lymphocytes Relative: 14 %
Lymphs Abs: 0.8 K/uL (ref 0.7–4.0)
MCH: 30.1 pg (ref 26.0–34.0)
MCHC: 32.6 g/dL (ref 30.0–36.0)
MCV: 92.4 fL (ref 78.0–100.0)
Monocytes Absolute: 0.6 K/uL (ref 0.1–1.0)
Monocytes Relative: 9 %
Neutro Abs: 4.5 K/uL (ref 1.7–7.7)
Neutrophils Relative %: 75 %
Platelets: 143 K/uL — ABNORMAL LOW (ref 150–400)
RBC: 4.62 MIL/uL (ref 4.22–5.81)
RDW: 15.4 % (ref 11.5–15.5)
WBC: 6 K/uL (ref 4.0–10.5)

## 2017-01-13 LAB — TSH: TSH: 0.807 u[IU]/mL (ref 0.350–4.500)

## 2017-01-13 MED ORDER — SODIUM CHLORIDE 0.9 % IV SOLN
10.2000 mg/kg | Freq: Once | INTRAVENOUS | Status: AC
  Administered 2017-01-13: 1100 mg via INTRAVENOUS
  Filled 2017-01-13: qty 20

## 2017-01-13 MED ORDER — ONDANSETRON 8 MG PO TBDP
8.0000 mg | ORAL_TABLET | Freq: Once | ORAL | Status: AC
  Administered 2017-01-13: 8 mg via ORAL
  Filled 2017-01-13: qty 1

## 2017-01-13 MED ORDER — HEPARIN SOD (PORK) LOCK FLUSH 100 UNIT/ML IV SOLN
500.0000 [IU] | Freq: Once | INTRAVENOUS | Status: AC
  Administered 2017-01-13: 500 [IU] via INTRAVENOUS

## 2017-01-13 MED ORDER — SODIUM CHLORIDE 0.9 % IV SOLN
INTRAVENOUS | Status: AC
  Administered 2017-01-13: 14:00:00 via INTRAVENOUS

## 2017-01-13 MED ORDER — SODIUM CHLORIDE 0.9 % IV SOLN: Freq: Once | INTRAVENOUS | Status: DC

## 2017-01-13 MED ORDER — HEPARIN SOD (PORK) LOCK FLUSH 100 UNIT/ML IV SOLN
INTRAVENOUS | Status: AC
  Filled 2017-01-13: qty 5

## 2017-01-13 NOTE — Progress Notes (Signed)
Erroneous encounter This encounter was created in error - please disregard. 

## 2017-01-13 NOTE — Patient Instructions (Signed)
[facility] Discharge Instructions for Patients Receiving Chemotherapy   Beginning January 23rd 2017 lab work for the [facility] will be done in the  Main lab at [facility] on 1st floor. If you have a lab appointment with the [facility] please come in thru the  Main Entrance and check in at the main information desk   Today you received the following chemotherapy agents:  Imfinizi  If you develop nausea and vomiting, or diarrhea that is not controlled by your medication, call the clinic.  The clinic phone number is [phone]. Office hours are Monday-Friday 8:30am-5:00pm.  BELOW ARE SYMPTOMS THAT SHOULD BE REPORTED IMMEDIATELY:  *FEVER GREATER THAN 101.0 F  *CHILLS WITH OR WITHOUT FEVER  NAUSEA AND VOMITING THAT IS NOT CONTROLLED WITH YOUR NAUSEA MEDICATION  *UNUSUAL SHORTNESS OF BREATH  *UNUSUAL BRUISING OR BLEEDING  TENDERNESS IN MOUTH AND THROAT WITH OR WITHOUT PRESENCE OF ULCERS  *URINARY PROBLEMS  *BOWEL PROBLEMS  UNUSUAL RASH Items with * indicate a potential emergency and should be followed up as soon as possible. If you have an emergency after office hours please contact your primary care physician or go to the nearest emergency department.  Please call the clinic during office hours if you have any questions or concerns.   You may also contact the Patient Navigator at [phone] should you have any questions or need assistance in obtaining follow up care.      Resources For Cancer Patients and their Caregivers ? American Cancer Society: Can assist with transportation, wigs, general needs, runs Look Good Feel Better.        551-470-4857 ? Cancer Care: Provides financial assistance, online support groups, medication/co-pay assistance.  1-800-813-HOPE 6095408525) ? [place] Assists [place] cancer patients and their families through emotional , educational and financial support.   (318)436-6958 ? [place] DSS Where to apply for food stamps, Medicaid and utility assistance. 505 220 1655 ? [facility]: Transportation to medical appointments. (732) 876-3592 ? Social Security Administration: May apply for disability if have a Stage IV cancer. 234 505 8173 (862)637-4558 ? [employer], Disability and Transit Services: Assists with nutrition, care and transit needs. [phone]

## 2017-01-13 NOTE — Progress Notes (Unsigned)
Tolerated infusion w/o adverse reaction.  Alert, in no distress.  VSS.  Discharged ambulatory.  

## 2017-01-27 ENCOUNTER — Encounter (HOSPITAL_BASED_OUTPATIENT_CLINIC_OR_DEPARTMENT_OTHER): Payer: Medicare Other | Admitting: Oncology

## 2017-01-27 ENCOUNTER — Encounter (HOSPITAL_BASED_OUTPATIENT_CLINIC_OR_DEPARTMENT_OTHER): Payer: Medicare Other

## 2017-01-27 ENCOUNTER — Encounter (HOSPITAL_COMMUNITY): Payer: Self-pay | Admitting: Oncology

## 2017-01-27 VITALS — BP 145/93 | HR 75 | Temp 98.1°F | Resp 18

## 2017-01-27 DIAGNOSIS — C3431 Malignant neoplasm of lower lobe, right bronchus or lung: Secondary | ICD-10-CM

## 2017-01-27 DIAGNOSIS — Z95828 Presence of other vascular implants and grafts: Secondary | ICD-10-CM

## 2017-01-27 DIAGNOSIS — Z5112 Encounter for antineoplastic immunotherapy: Secondary | ICD-10-CM

## 2017-01-27 DIAGNOSIS — C3491 Malignant neoplasm of unspecified part of right bronchus or lung: Secondary | ICD-10-CM

## 2017-01-27 DIAGNOSIS — D751 Secondary polycythemia: Secondary | ICD-10-CM | POA: Diagnosis not present

## 2017-01-27 DIAGNOSIS — Z452 Encounter for adjustment and management of vascular access device: Secondary | ICD-10-CM

## 2017-01-27 LAB — CBC WITH DIFFERENTIAL/PLATELET
BASOS ABS: 0 10*3/uL (ref 0.0–0.1)
Basophils Relative: 0 %
EOS PCT: 3 %
Eosinophils Absolute: 0.2 10*3/uL (ref 0.0–0.7)
HCT: 44.3 % (ref 39.0–52.0)
Hemoglobin: 14.7 g/dL (ref 13.0–17.0)
Lymphocytes Relative: 19 %
Lymphs Abs: 1.1 10*3/uL (ref 0.7–4.0)
MCH: 30.3 pg (ref 26.0–34.0)
MCHC: 33.2 g/dL (ref 30.0–36.0)
MCV: 91.3 fL (ref 78.0–100.0)
MONO ABS: 0.6 10*3/uL (ref 0.1–1.0)
Monocytes Relative: 11 %
Neutro Abs: 3.7 10*3/uL (ref 1.7–7.7)
Neutrophils Relative %: 67 %
PLATELETS: 137 10*3/uL — AB (ref 150–400)
RBC: 4.85 MIL/uL (ref 4.22–5.81)
RDW: 16 % — AB (ref 11.5–15.5)
WBC: 5.5 10*3/uL (ref 4.0–10.5)

## 2017-01-27 LAB — COMPREHENSIVE METABOLIC PANEL
ALT: 31 U/L (ref 17–63)
AST: 41 U/L (ref 15–41)
Albumin: 3.3 g/dL — ABNORMAL LOW (ref 3.5–5.0)
Alkaline Phosphatase: 138 U/L — ABNORMAL HIGH (ref 38–126)
Anion gap: 7 (ref 5–15)
BUN: 13 mg/dL (ref 6–20)
CO2: 30 mmol/L (ref 22–32)
CREATININE: 0.79 mg/dL (ref 0.61–1.24)
Calcium: 8.9 mg/dL (ref 8.9–10.3)
Chloride: 100 mmol/L — ABNORMAL LOW (ref 101–111)
GFR calc non Af Amer: >60 mL/min (ref >60)
Glucose, Bld: 141 mg/dL — ABNORMAL HIGH (ref 65–99)
POTASSIUM: 3.8 mmol/L (ref 3.5–5.1)
SODIUM: 137 mmol/L (ref 135–145)
Total Bilirubin: 0.7 mg/dL (ref 0.3–1.2)
Total Protein: 7.2 g/dL (ref 6.5–8.1)

## 2017-01-27 LAB — TSH: TSH: 0.87 u[IU]/mL (ref 0.350–4.500)

## 2017-01-27 MED ORDER — HEPARIN SOD (PORK) LOCK FLUSH 100 UNIT/ML IV SOLN
500.0000 [IU] | Freq: Once | INTRAVENOUS | Status: AC
  Administered 2017-01-27: 500 [IU] via INTRAVENOUS

## 2017-01-27 MED ORDER — SODIUM CHLORIDE 0.9 % IV SOLN: Freq: Once | INTRAVENOUS | Status: DC

## 2017-01-27 MED ORDER — DURVALUMAB 500 MG/10ML IV SOLN
10.2000 mg/kg | Freq: Once | INTRAVENOUS | Status: AC
  Administered 2017-01-27: 1100 mg via INTRAVENOUS
  Filled 2017-01-27: qty 20

## 2017-01-27 MED ORDER — HEPARIN SOD (PORK) LOCK FLUSH 100 UNIT/ML IV SOLN
INTRAVENOUS | Status: AC
  Filled 2017-01-27: qty 5

## 2017-01-27 MED ORDER — ONDANSETRON 8 MG PO TBDP
8.0000 mg | ORAL_TABLET | Freq: Once | ORAL | Status: AC
  Administered 2017-01-27: 8 mg via ORAL
  Filled 2017-01-27: qty 1

## 2017-01-27 NOTE — Progress Notes (Signed)
Erik Gravel, MD [place] [address]  Adenocarcinoma of right lung [facility])  CURRENT THERAPY: Durvalumab every 2 weeks x 1 year beginning on 12/02/2016.  INTERVAL HISTORY: Erik [name] 56 y.o. 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 S/P concomitant chemoXRT with Cisplatin/Etoposide (08/17/2016- 09/20/2016).  Now on immunotherapy for up to 1 year in accordance with NCCN guidelines.    Adenocarcinoma of right lung ([place])   07/01/2016 PET scan    6.3 x 3.7 cm right lower lobe mass, suspicious for primary bronchogenic neoplasm, as described above. Hypermetabolic thoracic nodal metastases, as above. Additional right perihilar hypermetabolism, indeterminate. Associated right middle lobe atelectasis/collapse.      07/19/2016 Procedure    Bronchoscopy with brushings and biopsies and endobronchial ultrasound with mediastinal lymph node aspirations by Dr. Roxan Hockey      07/21/2016 Pathology Results    Lung, biopsy, Right Middle Lobe - LUNG TISSUE WITH SQUAMOUS METAPLASIA. - NO MALIGNANCY IDENTIFIED.      07/21/2016 Pathology Results    FINE NEEDLE ASPIRATION, ENDOSCOPIC (A) LEVEL 7 (SPECIMEN 1 OF 3, COLLECTED ON 07/19/16): MALIGNANT CELLS CONSISTENT WITH ADENOCARCINOMA.      07/21/2016 Pathology Results    FINE NEEDLE ASPIRATION, EBUS, 4R, B (SPECIMEN 2 OF 3, COLLECTED 07/19/16): MALIGNANT CELLS CONSISTENT WITH ADENOCARCINOMA.      07/21/2016 Pathology Results    FINE NEEDLE ASPIRATION, EBUS, BRUSHING, RIGHT MIDDLE LOBE, D (SPECIMEN 3 OF 3, COLLECTED 07/19/16): MALIGNANT CELLS CONSISTENT WITH ADENOCARCINOMA.      07/22/2016 Imaging    MRI brain- No evidence of intracranial metastases.      08/17/2016 - 09/20/2016 Chemotherapy    The patient had palonosetron (ALOXI) injection 0.25 mg, 0.25 mg, Intravenous,  Once, 1 of 1 cycle  CISplatin (PLATINOL) 123 mg in sodium chloride 0.9 % 500 mL chemo infusion, 50 mg/m2 = 123 mg,  Intravenous,  Once, 1 of 1 cycle  etoposide (VEPESID) 120 mg in sodium chloride 0.9 % 500 mL chemo infusion, 50 mg/m2 = 120 mg, Intravenous,  Once, 1 of 1 cycle  fosaprepitant (EMEND) 150 mg, dexamethasone (DECADRON) 12 mg in sodium chloride 0.9 % 145 mL IVPB, , Intravenous,  Once, 1 of 1 cycle  ondansetron (ZOFRAN) 8 mg in sodium chloride 0.9 % 50 mL IVPB, , Intravenous,  Once, 2 of 6 cycles  for chemotherapy treatment.        08/17/2016 -  Radiation Therapy         11/15/2016 Imaging    CT CAP- Interval response to therapy. The previously demonstrated mediastinal and right hilar adenopathy and resulting right middle lobe atelectasis have all improved. 2. No discrete residual lung masses are identified. There is new multifocal ground-glass opacity within the right lower lobe, and to a lesser extent in the left upper lobe. These are probably inflammatory/treatment related. 3. No evidence of abdominopelvic metastatic disease. Stable prominent lymph nodes in the upper abdomen, likely reactive. 4. Decompressed mid SVC without specific signs of SVC occlusion.      12/02/2016 -  Chemotherapy    Imfinzi (durvalumab) immunotherapy every 2 weeks x up to 1 year      01/03/2017 Procedure    EGD by Dr. [name], colonoscopy aborted as "patient forgot to take other half of preparation). Esophagitis. likely radiation-induced ?Dilated.  Erythematous mucosa in the stomach. Biopsied. Normal duodenal bulb and second portion of the duodenum.      He underwent EGD by  Dr. [name] on 01/03/2017 that demonstrated esophagitis, likely radiation-induced.  Other findings demonstrated an erythematous mucosa in the stomach that was biopsied in the normal duodenal bulb and second portion of duodenum.  Colonoscopy was aborted because patient forgot to take the second half of this preparation.  He continues to tolerate treatment well.  He denies any side effects or intolerances to Imfinzi therapy.  He continues to  get his pain medication filled by his primary care physician.  Imfinzi side effects:  Lung problems (pneumonitis):   Shortness of breath   Chest pain    New or worse cough  Intestinal problems (colitis):   Diarrhea or more bowel movements than usual   Stools that are black, tarry, sticky, or have bloodwork mucous   Severe stomach (abdominal) pain or tenderness  Liver problems (hepatitis):   Yellowing of skin or the sclera of eyes   Nausea or vomiting   Pain on the right side of abdomen   Dark urine   Feeling less hungry than usual   Bleeding or bruising more easily than normal  Hormone gland problems (thyroid, pituitary, adrenal glands, and pancreas):   Rapid heartbeat   Weight loss or weight gain   Increased sweating   Feeling more hungry or thirsty   Urinating more than usual   Hair loss   Feeling cold   Constipation   Voice changes (voice getting deeper)   Muscle aches   Dizziness or fainting   Headaches that will not go away, or unusual headaches  Kidney problems (nephritis and kidney failure):   Change in amount of urine or color of urine   Problems and other organs:   Rash   Change in eyesight   Severe or persistent muscle or joint pains   Severe muscle weakness  Infusion (IV) reactions:   Chills or shaking   Shortness of breath or wheezing   Itching or rash   Flushing   Dizziness   Fevers   Feeling like passing out   Review of Systems  Constitutional: Negative.  Negative for chills and fever.  HENT: Negative.   Eyes: Negative.   Respiratory: Positive for cough. Negative for sputum production.   Cardiovascular: Positive for chest pain (improved).  Gastrointestinal: Positive for heartburn (persistent). Negative for blood in stool, constipation, diarrhea, nausea and vomiting.  Genitourinary: Negative.   Musculoskeletal: Negative.   Skin: Negative.   Neurological: Negative.  Negative for weakness.  Psychiatric/Behavioral: Negative.     Past Medical  History:  Diagnosis Date  . Arnold-Chiari syndrome ([place])   . Arthritis   . Chronic back pain   . Depression   . GERD (gastroesophageal reflux disease)   . Seasonal allergies   . Spinal stenosis of lumbar region   . Squamous cell carcinoma of right lung ([place]) 07/23/2016  . Wheezing     Past Surgical History:  Procedure Laterality Date  . BACK SURGERY     5 total  . BIOPSY  01/03/2017   Procedure: BIOPSY;  Surgeon: Daneil Dolin, MD;  [facility];  Service: Endoscopy;;  gastric  . ESOPHAGOGASTRODUODENOSCOPY (EGD) WITH PROPOFOL N/A 01/03/2017   Procedure: ESOPHAGOGASTRODUODENOSCOPY (EGD) WITH PROPOFOL;  Surgeon: Daneil Dolin, MD;  [facility];  Service: Endoscopy;  Laterality: N/A;  . Venia Minks DILATION N/A 01/03/2017   Procedure: Venia Minks DILATION;  Surgeon: Daneil Dolin, MD;  [facility];  Service: Endoscopy;  Laterality: N/A;  . MULTIPLE EXTRACTIONS WITH ALVEOLOPLASTY N/A 07/08/2014  Procedure: MULTIPLE EXTRACION WITH ALVEOLOPLASTY with EXCISION LESION RIGHT SIDE OF TONGUE;  Surgeon: Gae Bon, DDS;  Location: [facility];  Service: Oral Surgery;  Laterality: N/A;  . PORTACATH PLACEMENT Right 07/30/2016   Procedure: INSERTION PORT-A-CATH;  Surgeon: Vickie Epley, MD;  Location: [facility];  Service: Vascular;  Laterality: Right;  [name] VIDEO BRONCHOSCOPY WITH ENDOBRONCHIAL ULTRASOUND N/A 07/19/2016   Procedure: VIDEO BRONCHOSCOPY WITH ENDOBRONCHIAL ULTRASOUND;  Surgeon: [name], MD;  Location: [facility] OR;  Service: Thoracic;  Laterality: N/A;    Family History  Problem Relation Age of Onset  . Cancer Mother     breast cancer  . Heart failure Brother   . Colon cancer Neg Hx     not sure, ?grandfather and/or uncle    Social History   Social History  . Marital status: Divorced    Spouse name: N/A  . Number of children: N/A  . Years of education: N/A   Social History Main Topics  . Smoking status: Current Every Day Smoker    Packs/day:  0.25    Years: 38.00    Types: Cigarettes  . Smokeless tobacco: Former Systems developer    Types: Snuff     Comment: using chantix- smoking once in a while  . Alcohol use 1.8 oz/week    3 Cans of beer per week     Comment: reports drinking 2-3 beers a week  . Drug use: Yes    Types: Hydrocodone  . Sexual activity: Not Currently   Other Topics Concern  . None   Social History Narrative  . None     PHYSICAL EXAMINATION  ECOG PERFORMANCE STATUS: 1 - Symptomatic but completely ambulatory  Vitals:   01/27/17 0955  BP: (!) 141/78  Pulse: 87  Resp: 18  Temp: 98.3 F (36.8 C)     GENERAL:alert, no distress, well nourished, well developed, comfortable, cooperative, obese, smiling and unaccompanied, in chemo-recliner. SKIN: skin color, texture, turgor are normal, no rashes or significant lesions HEAD: Normocephalic, No masses, lesions, tenderness or abnormalities EYES: normal, EOMI, Conjunctiva are pink and non-injected EARS: External ears normal OROPHARYNX:lips, buccal mucosa, and tongue normal and mucous membranes are moist  NECK: supple, trachea midline LYMPH:  no palpable lymphadenopathy BREAST:not examined LUNGS: Expiratory wheezing bilaterally with rhonchi bibasilar. HEART: regular rate & rhythm, no murmurs and no gallops ABDOMEN:abdomen soft, non-tender and normal bowel sounds BACK: Back symmetric, no curvature. EXTREMITIES:less then 2 second capillary refill, no joint deformities, effusion, or inflammation, no skin discoloration, no cyanosis  NEURO: alert & oriented x 3 with fluent speech, no focal motor/sensory deficits, gait normal   LABORATORY DATA: CBC    Component Value Date/Time   WBC 5.5 01/27/2017 1026   RBC 4.85 01/27/2017 1026   HGB 14.7 01/27/2017 1026   HCT 44.3 01/27/2017 1026   PLT 137 (L) 01/27/2017 1026   MCV 91.3 01/27/2017 1026   MCH 30.3 01/27/2017 1026   MCHC 33.2 01/27/2017 1026   RDW 16.0 (H) 01/27/2017 1026   LYMPHSABS 1.1 01/27/2017 1026    MONOABS 0.6 01/27/2017 1026   EOSABS 0.2 01/27/2017 1026   BASOSABS 0.0 01/27/2017 1026      Chemistry      Component Value Date/Time   NA 137 01/27/2017 1026   K 3.8 01/27/2017 1026   CL 100 (L) 01/27/2017 1026   CO2 30 01/27/2017 1026   BUN 13 01/27/2017 1026   CREATININE 0.79 01/27/2017 1026      Component Value Date/Time  CALCIUM 8.9 01/27/2017 1026   ALKPHOS 138 (H) 01/27/2017 1026   AST 41 01/27/2017 1026   ALT 31 01/27/2017 1026   BILITOT 0.7 01/27/2017 1026        PENDING LABS:   RADIOGRAPHIC STUDIES:  No results found.   PATHOLOGY:    ASSESSMENT AND PLAN:  Adenocarcinoma of right lung (H[facility]) Stage IIIA (T3N2M0) adenocarcinoma of right lung.  S/P biopsy by Dr. Roxan Hockey on 07/19/2016 proving Stage III disease.  Concomitant chemoXRT with Cisplatin/Etoposide finishing on 09/20/2016 with curative intent.  Now on immunotherapy durvalumab every 2 weeks beginning on 12/02/2016 which will be given up to 1 year.  Oncology history is updated.    Pre-treatment labs: CBC diff, CMET, TSH as ordered.  I personally reviewed and went over laboratory results with the patient.  The results are noted within this dictation.  Smoking cessation education is provided today.  He notes that he is down to < 1 ppd (previously smoking 2 + ppd prior to Dx).  CT chest is ordered and scheduled in March 2018 by Dr. Tammi Klippel (Radiation Oncology).  He is status post EGD on 01/03/2017 by Dr. [name] demonstrating esophagitis, erythematous mucosa in the stomach, otherwise normal.  Colonoscopy was also scheduled and this was aborted due to patient failing to complete second half of preparation.  Pathology from stomach biopsy is negative for any dysplasia or malignancy.  It does demonstrate gastritis.  I personally reviewed and went over pathology results with the patient.  Treatment every 2 weeks.  Return in 4 weeks for follow-up.   ORDERS PLACED FOR THIS ENCOUNTER: No orders of the  defined types were placed in this encounter.   MEDICATIONS PRESCRIBED THIS ENCOUNTER: Meds ordered this encounter  Medications  . CARAFATE 1 GM/10ML suspension    THERAPY PLAN:  Continue immunotherapy as outlined for up to 1 year.  All questions were answered. The patient knows to call the clinic with any problems, questions or concerns. We can certainly see the patient much sooner if necessary.  Patient and plan discussed with Dr. Twana First and she is in agreement with the aforementioned.   This note is electronically signed by: Doy Mince 01/27/2017 5:54 PM

## 2017-01-27 NOTE — Assessment & Plan Note (Signed)
Stage IIIA (T3N2M0) adenocarcinoma of right lung.  S/P biopsy by Dr. Roxan Hockey on 07/19/2016 proving Stage III disease.  Concomitant chemoXRT with Cisplatin/Etoposide finishing on 09/20/2016 with curative intent.  Now on immunotherapy durvalumab every 2 weeks beginning on 12/02/2016 which will be given up to 1 year.  Oncology history is updated.    Pre-treatment labs: CBC diff, CMET, TSH as ordered.  I personally reviewed and went over laboratory results with the patient.  The results are noted within this dictation.  Smoking cessation education is provided today.  He notes that he is down to < 1 ppd (previously smoking 2 + ppd prior to Dx).  CT chest is ordered and scheduled in March 2018 by Dr. Tammi Klippel (Radiation Oncology).  He is status post EGD on 01/03/2017 by Dr. [name] demonstrating esophagitis, erythematous mucosa in the stomach, otherwise normal.  Colonoscopy was also scheduled and this was aborted due to patient failing to complete second half of preparation.  Pathology from stomach biopsy is negative for any dysplasia or malignancy.  It does demonstrate gastritis.  I personally reviewed and went over pathology results with the patient.  Treatment every 2 weeks.  Return in 4 weeks for follow-up.

## 2017-01-27 NOTE — Progress Notes (Signed)
Per T.kefalas,PA, draw labs today but do not need to wait on results to order chemo. May treat with chemo today as ordered.  Tolerated chemo well. Stable and ambulatory on discharge home to self.

## 2017-01-27 NOTE — Patient Instructions (Signed)
[facility] Discharge Instructions  RECOMMENDATIONS MADE BY THE CONSULTANT AND ANY TEST RESULTS WILL BE SENT TO YOUR REFERRING PHYSICIAN.  You were seen today by Tom Kefalas PA-C.   Thank you for choosing [facility] at [facility] to provide your oncology and hematology care.  To afford each patient quality time with our provider, please arrive at least 15 minutes before your scheduled appointment time.    If you have a lab appointment with the Cancer Center please come in thru the  Main Entrance and check in at the main information desk  You need to re-schedule your appointment should you arrive 10 or more minutes late.  We strive to give you quality time with our providers, and arriving late affects you and other patients whose appointments are after yours.  Also, if you no show three or more times for appointments you may be dismissed from the clinic at the providers discretion.     Again, thank you for choosing [facility].  Our hope is that these requests will decrease the amount of time that you wait before being seen by our physicians.       _____________________________________________________________  Should you have questions after your visit to [facility], please contact our office at [phone] between the hours of 8:30 a.m. and 4:30 p.m.  Voicemails left after 4:30 p.m. will not be returned until the following business day.  For prescription refill requests, have your pharmacy contact our office.       Resources For Cancer Patients and their Caregivers ? American Cancer Society: Can assist with transportation, wigs, general needs, runs Look Good Feel Better.        1-888-227-6333 ? Cancer Care: Provides financial assistance, online support groups, medication/co-pay assistance.  1-800-813-HOPE (4673) ? [facility] Assists [place] cancer patients and their  families through emotional , educational and financial support.  336-427-4357 ? [place] DSS Where to apply for food stamps, Medicaid and utility assistance. 336-342-1394 ? [facility]: Transportation to medical appointments. 336-347-2287 ? Social Security Administration: May apply for disability if have a Stage IV cancer. 336-342-7796 1-800-772-1213 ? [place] Aging, Disability and Transit Services: Assists with nutrition, care and transit needs. 336-349-2343  Cancer Center Support Programs: @10RELATIVEDAYS@ > Cancer Support Group  2nd Tuesday of the month 1pm-2pm, Journey Room  > Creative Journey  3rd Tuesday of the month 1130am-1pm, Journey Room  > Look Good Feel Better  1st Wednesday of the month 10am-12 noon, Journey Room (Call American Cancer Society to register 1-800-395-5775)    

## 2017-02-02 ENCOUNTER — Other Ambulatory Visit: Payer: Self-pay

## 2017-02-02 ENCOUNTER — Ambulatory Visit (INDEPENDENT_AMBULATORY_CARE_PROVIDER_SITE_OTHER): Payer: Medicare Other | Admitting: Gastroenterology

## 2017-02-02 ENCOUNTER — Encounter: Payer: Self-pay | Admitting: Gastroenterology

## 2017-02-02 VITALS — BP 147/83 | HR 88 | Temp 98.5°F | Ht 72.0 in | Wt 230.2 lb

## 2017-02-02 DIAGNOSIS — Z1211 Encounter for screening for malignant neoplasm of colon: Secondary | ICD-10-CM

## 2017-02-02 DIAGNOSIS — T66XXXA Radiation sickness, unspecified, initial encounter: Secondary | ICD-10-CM

## 2017-02-02 DIAGNOSIS — K208 Other esophagitis without bleeding: Secondary | ICD-10-CM | POA: Insufficient documentation

## 2017-02-02 MED ORDER — NA SULFATE-K SULFATE-MG SULF 17.5-3.13-1.6 GM/177ML PO SOLN: 1.0000 | ORAL | 0 refills | Status: DC

## 2017-02-02 NOTE — Assessment & Plan Note (Signed)
Switch to Nexium twice a day. Symptoms resolved. Clinically doing much better. Dysphagia improved status post dilation. Continue current PPI regimen.

## 2017-02-02 NOTE — Patient Instructions (Addendum)
1. Continue Nexium twice daily before a meal. 2. Screening colonoscopy as scheduled. Please see separate instructions.

## 2017-02-02 NOTE — Assessment & Plan Note (Signed)
Patient inadvertently forgot to drink the second dose of his bowel prep last time therefore his colonoscopy was not done. States that he has forgetfulness since his chemotherapy. He will have a sister help remind him and put a reminder on his phone. Plan for colonoscopy with deep sedation in the OR given polypharmacy.  I have discussed the risks, alternatives, benefits with regards to but not limited to the risk of reaction to medication, bleeding, infection, perforation and the patient is agreeable to proceed. Written consent to be obtained.

## 2017-02-02 NOTE — Progress Notes (Signed)
Primary Care Physician: [name], MD  Primary Gastroenterologist:  Garfield Cornea, MD   Chief Complaint  Patient presents with  . Gastroesophageal Reflux    f/u, better    HPI: Erik [name] is a 56 y.o. male here for follow-up. We saw him back in January for GERD and weight loss. 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 Completed these therapies 09/20/2016. Now on immunotherapy (durvalumab) for up to one year, receiving injection every 2 weeks. Most recent imaging CT chest/abdomen/pelvis. Possible mild wall thickening in the midesophagus? Treatment related.Interval response to therapy with improvement of mediastinal and right hilar adenopathy, no discrete residual lung masses, no abdominal pelvic metastatic disease.  Since his last office visit he underwent an EGD for reflux symptoms that started after his XRT/chemotherapy. Also with odynophagia initially. Intermittent dysphagia.He was found have a 10 cm segment of focal esophagitis in the proximal esophagus, diffuse erythema of the stomach dilation was performed with [facility] dilator with mild resistance at 24 [place]. 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  Clinically he feels better. Upper GI symptoms resolved. Denies abdominal pain, constipation, diarrhea, melena, rectal bleeding. Weight is stable. He would like to pursue screening colonoscopy at this time.  Current Outpatient Prescriptions  Medication Sig Dispense Refill  . albuterol (PROVENTIL HFA;VENTOLIN HFA) 108 (90 BASE) MCG/ACT inhaler Inhale 2 puffs into the lungs every 6 (six) hours as needed for wheezing.     [name] ALPRAZolam (XANAX) 1 MG tablet Take 1 mg by mouth 2 (two) times daily as needed for anxiety.     [name] amLODipine (NORVASC) 5 MG tablet Take 5 mg by mouth daily.     . budesonide-formoterol (SYMBICORT) 160-4.5 MCG/ACT inhaler Inhale 2 puffs into the lungs daily.    [name]  Dextromethorphan-Guaifenesin (CORICIDIN HBP CONGESTION/COUGH PO) Take 1 tablet by mouth 2 (two) times daily as needed (congestion).    Hunt Oris IV Inject into the vein. Every 2 weeks    . escitalopram (LEXAPRO) 20 MG tablet Take 20 mg by mouth daily.     [name] esomeprazole (NEXIUM) 40 MG capsule TAKE (1) CAPSULE BY MOUTH TWICE DAILY BEFORE A MEAL. 60 capsule 0  . gabapentin (NEURONTIN) 300 MG capsule Take 600 mg by mouth 3 (three) times daily.     . GuaiFENesin (MUCINEX PO) Take 1 tablet by mouth 2 (two) times daily as needed (congestion).    [name] lidocaine-prilocaine (EMLA) cream Apply a quarter size amount to port site 1 hour prior to chemo. Do not rub in. Cover with plastic wrap. (Patient taking differently: Apply 1 application topically See admin instructions. Apply a quarter size amount to port site 1 hour prior to chemo. Do not rub in. Cover with plastic wrap.) 30 g 2  . methadone (DOLOPHINE) 10 MG tablet Take 20 mg by mouth 5 (five) times daily. Takes 2 tablets 5 times daily    . nystatin (MYCOSTATIN) 100000 UNIT/ML suspension Use as directed 5 mLs in the mouth or throat daily as needed.     . ondansetron (ZOFRAN) 8 MG tablet Take 1 tablet (8 mg total) by mouth every 8 (eight) hours as needed for nausea or vomiting. 30 tablet 2  . prochlorperazine (COMPAZINE) 10 MG tablet TAKE 1 TABLET EVERY 6 HOURS AS NEEDED FOR NAUSEA AND VOMITING. 30 tablet 1  . tiZANidine (ZANAFLEX) 4 MG tablet Take 4 mg by mouth 3 (three) times daily.     [name]  varenicline (CHANTIX) 1 MG tablet Take 1 mg by mouth 2 (two) times daily.     No current facility-administered medications for this visit.    Facility-Administered Medications Ordered in Other Visits  Medication Dose Route Frequency Provider Last Rate Last Dose  . 0.9 %  sodium chloride infusion   Intravenous Continuous Baird Cancer, PA-C 10 mL/hr at 01/13/17 1330      Allergies as of 02/02/2017 - Review Complete 02/02/2017  Allergen Reaction Noted  .  Tetracyclines & related Swelling and Other (See Comments) 09/25/2011   Past Medical History:  Diagnosis Date  . Arnold-Chiari syndrome ([place])   . Arthritis   . Chronic back pain   . Depression   . GERD (gastroesophageal reflux disease)   . Seasonal allergies   . Spinal stenosis of lumbar region   . 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 Wheezing    Past Surgical History:  Procedure Laterality Date  . BACK SURGERY     5 total  . BIOPSY  01/03/2017   Procedure: BIOPSY;  Surgeon: Daneil Dolin, MD;  Location: [facility];  Service: Endoscopy;;  gastric  . ESOPHAGOGASTRODUODENOSCOPY (EGD) WITH PROPOFOL N/A 01/03/2017   Procedure: ESOPHAGOGASTRODUODENOSCOPY (EGD) WITH PROPOFOL;  Surgeon: Daneil Dolin, MD;  Location: [facility];  Service: Endoscopy;  Laterality: N/A;  . Venia Minks DILATION N/A 01/03/2017   Procedure: Venia Minks DILATION;  Surgeon: Daneil Dolin, MD;  Location: [facility];  Service: Endoscopy;  Laterality: N/A;  . MULTIPLE EXTRACTIONS WITH ALVEOLOPLASTY N/A 07/08/2014   Procedure: MULTIPLE EXTRACION WITH ALVEOLOPLASTY with EXCISION LESION RIGHT SIDE OF TONGUE;  Surgeon: Gae Bon, DDS;  Location: [place];  Service: Oral Surgery;  Laterality: N/A;  . PORTACATH PLACEMENT Right 07/30/2016   Procedure: INSERTION PORT-A-CATH;  Surgeon: Vickie Epley, MD;  Location: [facility];  Service: Vascular;  Laterality: Right;  [name] VIDEO BRONCHOSCOPY WITH ENDOBRONCHIAL ULTRASOUND N/A 07/19/2016   Procedure: VIDEO BRONCHOSCOPY WITH ENDOBRONCHIAL ULTRASOUND;  Surgeon: [name], MD;  Location: [facility];  Service: Thoracic;  Laterality: N/A;   Family History  Problem Relation Age of Onset  . Cancer Mother     breast cancer  . Heart failure Brother   . Colon cancer Neg Hx     not sure, ?grandfather and/or uncle   Social History   Social History  . Marital status: Divorced    Spouse name: N/A  . Number of children: N/A  . Years of education: N/A   Social  History Main Topics  . Smoking status: Current Every Day Smoker    Packs/day: 0.25    Years: 38.00    Types: Cigarettes  . Smokeless tobacco: Former Systems developer    Types: Snuff     Comment: using chantix- smoking once in a while  . Alcohol use 1.8 oz/week    3 Cans of beer per week     Comment: reports drinking 2-3 beers a week  . Drug use: Yes    Types: Hydrocodone  . Sexual activity: Not Currently   Other Topics Concern  . None   Social History Narrative  . None    ROS:  General: Negative for anorexia, weight loss, fever, chills, fatigue, weakness. ENT: Negative for hoarseness, difficulty swallowing , nasal congestion. CV: Negative for chest pain, angina, palpitations, dyspnea on exertion, peripheral edema.  Respiratory: Negative for dyspnea at rest, dyspnea on exertion, cough, sputum, wheezing.  GI: See history of present illness. GU:  Negative for dysuria, hematuria, urinary incontinence, urinary frequency, nocturnal urination.  Endo: Negative for unusual weight change.    Physical Examination:   BP (!) 147/83   Pulse 88   Temp 98.5 F (36.9 C) (Oral)   Ht 6' (1.829 m)   Wt 230 lb 3.2 oz (104.4 kg)   BMI 31.22 kg/m   General: Well-nourished, well-developed in no acute distress.  Eyes: No icterus. Mouth: Oropharyngeal mucosa moist and pink , no lesions erythema or exudate. Lungs: Clear to auscultation bilaterally.  Heart: Regular rate and rhythm, no murmurs rubs or gallops.  Abdomen: Bowel sounds are normal, nontender, nondistended, no hepatosplenomegaly or masses, no abdominal bruits or hernia , no rebound or guarding.   Extremities: No lower extremity edema. No clubbing or deformities. Neuro: Alert and oriented x 4   Skin: Warm and dry, no jaundice.   Psych: Alert and cooperative, normal mood and affect.

## 2017-02-03 NOTE — Progress Notes (Signed)
CC'ED TO PCP 

## 2017-02-07 NOTE — Patient Instructions (Signed)
Erik [name]  02/07/2017     '@PREFPERIOPPHARMACY'$ @   Your procedure is scheduled on  02/15/2017   Report to [facility] at  109  A.M.  Call this number if you have problems the morning of surgery:  [phone]   Remember:  Do not eat food or drink liquids after midnight.  Take these medicines the morning of surgery with A SIP OF WATER  Xanax, amlodipine, lexapro, nexium, neurontin, methadone, zofran or compazine, zanaflex, chantix. Take you inhalers before you come and bring your rescue inhaler with you, if you have one.   Do not wear jewelry, make-up or nail polish.  Do not wear lotions, powders, or perfumes, or deoderant.  Do not shave 48 hours prior to surgery.  Men may shave face and neck.  Do not bring valuables to the hospital.  [facility] is not responsible for any belongings or valuables.  Contacts, dentures or bridgework may not be worn into surgery.  Leave your suitcase in the car.  After surgery it may be brought to your room.  For patients admitted to the hospital, discharge time will be determined by your treatment team.  Patients discharged the day of surgery will not be allowed to drive home.   Name and phone number of your driver:   family Special instructions:  Follow the diet and prep instructions given to you by Dr Roseanne Kaufman office.  Please read over the following fact sheets that you were given. Anesthesia Post-op Instructions and Care and Recovery After Surgery       Colonoscopy, Adult A colonoscopy is an exam to look at the entire large intestine. During the exam, a lubricated, bendable tube is inserted into the anus and then passed into the rectum, colon, and other parts of the large intestine. A colonoscopy is often done as a part of normal colorectal screening or in response to certain symptoms, such as anemia, persistent diarrhea, abdominal pain, and blood in the stool. The exam can help screen for and diagnose medical problems,  including:  Tumors.  Polyps.  Inflammation.  Areas of bleeding. Tell a health care provider about:  Any allergies you have.  All medicines you are taking, including vitamins, herbs, eye drops, creams, and over-the-counter medicines.  Any problems you or family members have had with anesthetic medicines.  Any blood disorders you have.  Any surgeries you have had.  Any medical conditions you have.  Any problems you have had passing stool. What are the risks? Generally, this is a safe procedure. However, problems may occur, including:  Bleeding.  A tear in the intestine.  A reaction to medicines given during the exam.  Infection (rare). What happens before the procedure? Eating and drinking restrictions  Follow instructions from your health care provider about eating and drinking, which may include:  A few days before the procedure - follow a low-fiber diet. Avoid nuts, seeds, dried fruit, raw fruits, and vegetables.  1-3 days before the procedure - follow a clear liquid diet. Drink only clear liquids, such as clear broth or bouillon, black coffee or tea, clear juice, clear soft drinks or sports drinks, gelatin dessert, and popsicles. Avoid any liquids that contain red or purple dye.  On the day of the procedure - do not eat or drink anything during the 2 hours before the procedure, or within the time period that your health care provider recommends. Bowel prep  If you were prescribed an oral bowel  prep to clean out your colon:  Take it as told by your health care provider. Starting the day before your procedure, you will need to drink a large amount of medicated liquid. The liquid will cause you to have multiple loose stools until your stool is almost clear or light green.  If your skin or anus gets irritated from diarrhea, you may use these to relieve the irritation:  Medicated wipes, such as adult wet wipes with aloe and vitamin E.  A skin soothing-product like  petroleum jelly.  If you vomit while drinking the bowel prep, take a break for up to 60 minutes and then begin the bowel prep again. If vomiting continues and you cannot take the bowel prep without vomiting, call your health care provider. General instructions   Ask your health care provider about changing or stopping your regular medicines. This is especially important if you are taking diabetes medicines or blood thinners.  Plan to have someone take you home from the hospital or clinic. What happens during the procedure?  An IV tube may be inserted into one of your veins.  You will be given medicine to help you relax (sedative).  To reduce your risk of infection:  Your health care team will wash or sanitize their hands.  Your anal area will be washed with soap.  You will be asked to lie on your side with your knees bent.  Your health care provider will lubricate a long, thin, flexible tube. The tube will have a camera and a light on the end.  The tube will be inserted into your anus.  The tube will be gently eased through your rectum and colon.  Air will be delivered into your colon to keep it open. You may feel some pressure or cramping.  The camera will be used to take images during the procedure.  A small tissue sample may be removed from your body to be examined under a microscope (biopsy). If any potential problems are found, the tissue will be sent to a lab for testing.  If small polyps are found, your health care provider may remove them and have them checked for cancer cells.  The tube that was inserted into your anus will be slowly removed. The procedure may vary among health care providers and hospitals. What happens after the procedure?  Your blood pressure, heart rate, breathing rate, and blood oxygen level will be monitored until the medicines you were given have worn off.  Do not drive for 24 hours after the exam.  You may have a small amount of blood in  your stool.  You may pass gas and have mild abdominal cramping or bloating due to the air that was used to inflate your colon during the exam.  It is up to you to get the results of your procedure. Ask your health care provider, or the department performing the procedure, when your results will be ready. This information is not intended to replace advice given to you by your health care provider. Make sure you discuss any questions you have with your health care provider. Document Released: 11/19/2000 Document Revised: 09/22/2016 Document Reviewed: 02/03/2016 Elsevier Interactive Patient Education  2017 [employer]  Colonoscopy, Adult, Care After This sheet gives you information about how to care for yourself after your procedure. Your health care provider may also give you more specific instructions. If you have problems or questions, contact your health care provider. What can I expect after the procedure? After the  procedure, it is common to have:  A small amount of blood in your stool for 24 hours after the procedure.  Some gas.  Mild abdominal cramping or bloating. Follow these instructions at home: General instructions    For the first 24 hours after the procedure:  Do not drive or use machinery.  Do not sign important documents.  Do not drink alcohol.  Do your regular daily activities at a slower pace than normal.  Eat soft, easy-to-digest foods.  Rest often.  Take over-the-counter or prescription medicines only as told by your health care provider.  It is up to you to get the results of your procedure. Ask your health care provider, or the department performing the procedure, when your results will be ready. Relieving cramping and bloating   Try walking around when you have cramps or feel bloated.  Apply heat to your abdomen as told by your health care provider. Use a heat source that your health care provider recommends, such as a moist heat pack or a heating  pad.  Place a towel between your skin and the heat source.  Leave the heat on for 20-30 minutes.  Remove the heat if your skin turns bright red. This is especially important if you are unable to feel pain, heat, or cold. You may have a greater risk of getting burned. Eating and drinking   Drink enough fluid to keep your urine clear or pale yellow.  Resume your normal diet as instructed by your health care provider. Avoid heavy or fried foods that are hard to digest.  Avoid drinking alcohol for as long as instructed by your health care provider. Contact a health care provider if:  You have blood in your stool 2-3 days after the procedure. Get help right away if:  You have more than a small spotting of blood in your stool.  You pass large blood clots in your stool.  Your abdomen is swollen.  You have nausea or vomiting.  You have a fever.  You have increasing abdominal pain that is not relieved with medicine. This information is not intended to replace advice given to you by your health care provider. Make sure you discuss any questions you have with your health care provider. Document Released: 07/06/2004 Document Revised: 08/16/2016 Document Reviewed: 02/03/2016 Elsevier Interactive Patient Education  2017 [place] Anesthesia is a term that refers to techniques, procedures, and medicines that help a person stay safe and comfortable during a medical procedure. Monitored anesthesia care, or sedation, is one type of anesthesia. Your anesthesia specialist may recommend sedation if you will be having a procedure that does not require you to be unconscious, such as:  Cataract surgery.  A dental procedure.  A biopsy.  A colonoscopy. During the procedure, you may receive a medicine to help you relax (sedative). There are three levels of sedation:  Mild sedation. At this level, you may feel awake and relaxed. You will be able to follow  directions.  Moderate sedation. At this level, you will be sleepy. You may not remember the procedure.  Deep sedation. At this level, you will be asleep. You will not remember the procedure. The more medicine you are given, the deeper your level of sedation will be. Depending on how you respond to the procedure, the anesthesia specialist may change your level of sedation or the type of anesthesia to fit your needs. An anesthesia specialist will monitor you closely during the procedure. Let your health  care provider know about:  Any allergies you have.  All medicines you are taking, including vitamins, herbs, eye drops, creams, and over-the-counter medicines.  Any use of steroids (by mouth or as a cream).  Any problems you or family members have had with sedatives and anesthetic medicines.  Any blood disorders you have.  Any surgeries you have had.  Any medical conditions you have, such as sleep apnea.  Whether you are pregnant or may be pregnant.  Any use of cigarettes, alcohol, or street drugs. What are the risks? Generally, this is a safe procedure. However, problems may occur, including:  Getting too much medicine (oversedation).  Nausea.  Allergic reaction to medicines.  Trouble breathing. If this happens, a breathing tube may be used to help with breathing. It will be removed when you are awake and breathing on your own.  Heart trouble.  Lung trouble. Before the procedure Staying hydrated  Follow instructions from your health care provider about hydration, which may include:  Up to 2 hours before the procedure - you may continue to drink clear liquids, such as water, clear fruit juice, black coffee, and plain tea. Eating and drinking restrictions  Follow instructions from your health care provider about eating and drinking, which may include:  8 hours before the procedure - stop eating heavy meals or foods such as meat, fried foods, or fatty foods.  6 hours  before the procedure - stop eating light meals or foods, such as toast or cereal.  6 hours before the procedure - stop drinking milk or drinks that contain milk.  2 hours before the procedure - stop drinking clear liquids. Medicines  Ask your health care provider about:  Changing or stopping your regular medicines. This is especially important if you are taking diabetes medicines or blood thinners.  Taking medicines such as aspirin and ibuprofen. These medicines can thin your blood. Do not take these medicines before your procedure if your health care provider instructs you not to. Tests and exams  You will have a physical exam.  You may have blood tests done to show:  How well your kidneys and liver are working.  How well your blood can clot.  General instructions  Plan to have someone take you home from the hospital or clinic.  If you will be going home right after the procedure, plan to have someone with you for 24 hours. What happens during the procedure?  Your blood pressure, heart rate, breathing, level of pain and overall condition will be monitored.  An IV tube will be inserted into one of your veins.  Your anesthesia specialist will give you medicines as needed to keep you comfortable during the procedure. This may mean changing the level of sedation.  The procedure will be performed. After the procedure  Your blood pressure, heart rate, breathing rate, and blood oxygen level will be monitored until the medicines you were given have worn off.  Do not drive for 24 hours if you received a sedative.  You may:  Feel sleepy, clumsy, or nauseous.  Feel forgetful about what happened after the procedure.  Have a sore throat if you had a breathing tube during the procedure.  Vomit. This information is not intended to replace advice given to you by your health care provider. Make sure you discuss any questions you have with your health care provider. Document  Released: 08/18/2005 Document Revised: 04/30/2016 Document Reviewed: 03/14/2016 Elsevier Interactive Patient Education  2017 [place], Care  After These instructions provide you with information about caring for yourself after your procedure. Your health care provider may also give you more specific instructions. Your treatment has been planned according to current medical practices, but problems sometimes occur. Call your health care provider if you have any problems or questions after your procedure. What can I expect after the procedure? After your procedure, it is common to:  Feel sleepy for several hours.  Feel clumsy and have poor balance for several hours.  Feel forgetful about what happened after the procedure.  Have poor judgment for several hours.  Feel nauseous or vomit.  Have a sore throat if you had a breathing tube during the procedure. Follow these instructions at home: For at least 24 hours after the procedure:    Do not:  Participate in activities in which you could fall or become injured.  Drive.  Use heavy machinery.  Drink alcohol.  Take sleeping pills or medicines that cause drowsiness.  Make important decisions or sign legal documents.  Take care of children on your own.  Rest. Eating and drinking   Follow the diet that is recommended by your health care provider.  If you vomit, drink water, juice, or soup when you can drink without vomiting.  Make sure you have little or no nausea before eating solid foods. General instructions   Have a responsible adult stay with you until you are awake and alert.  Take over-the-counter and prescription medicines only as told by your health care provider.  If you smoke, do not smoke without supervision.  Keep all follow-up visits as told by your health care provider. This is important. Contact a health care provider if:  You keep feeling nauseous or you keep  vomiting.  You feel light-headed.  You develop a rash.  You have a fever. Get help right away if:  You have trouble breathing. This information is not intended to replace advice given to you by your health care provider. Make sure you discuss any questions you have with your health care provider. Document Released: 03/14/2016 Document Revised: 07/14/2016 Document Reviewed: 03/14/2016 Elsevier Interactive Patient Education  2017 [employer].

## 2017-02-09 ENCOUNTER — Encounter (HOSPITAL_COMMUNITY)
Admission: RE | Admit: 2017-02-09 | Discharge: 2017-02-09 | Disposition: A | Discharge status: Home / Self Care | Payer: Medicare Other | Source: Ambulatory Visit | Attending: Internal Medicine | Admitting: Internal Medicine

## 2017-02-10 ENCOUNTER — Encounter (HOSPITAL_COMMUNITY): Payer: Self-pay

## 2017-02-10 ENCOUNTER — Ambulatory Visit (HOSPITAL_COMMUNITY): Payer: Medicare Other

## 2017-02-11 ENCOUNTER — Encounter (HOSPITAL_COMMUNITY): Payer: Self-pay

## 2017-02-11 ENCOUNTER — Encounter (HOSPITAL_COMMUNITY): Payer: Medicare Other | Attending: Oncology

## 2017-02-11 VITALS — BP 138/80 | HR 84 | Temp 98.5°F | Resp 18 | Wt 228.6 lb

## 2017-02-11 DIAGNOSIS — Z5112 Encounter for antineoplastic immunotherapy: Secondary | ICD-10-CM

## 2017-02-11 DIAGNOSIS — C3431 Malignant neoplasm of lower lobe, right bronchus or lung: Secondary | ICD-10-CM

## 2017-02-11 DIAGNOSIS — C3491 Malignant neoplasm of unspecified part of right bronchus or lung: Secondary | ICD-10-CM

## 2017-02-11 LAB — COMPREHENSIVE METABOLIC PANEL
ALT: 32 U/L (ref 17–63)
AST: 41 U/L (ref 15–41)
Albumin: 3.5 g/dL (ref 3.5–5.0)
Alkaline Phosphatase: 142 U/L — ABNORMAL HIGH (ref 38–126)
Anion gap: 8 (ref 5–15)
BUN: 7 mg/dL (ref 6–20)
CHLORIDE: 101 mmol/L (ref 101–111)
CO2: 28 mmol/L (ref 22–32)
CREATININE: 0.74 mg/dL (ref 0.61–1.24)
Calcium: 9.1 mg/dL (ref 8.9–10.3)
GFR calc Af Amer: >60 mL/min (ref >60)
Glucose, Bld: 155 mg/dL — ABNORMAL HIGH (ref 65–99)
Potassium: 3.3 mmol/L — ABNORMAL LOW (ref 3.5–5.1)
Sodium: 137 mmol/L (ref 135–145)
Total Bilirubin: 0.8 mg/dL (ref 0.3–1.2)
Total Protein: 7.4 g/dL (ref 6.5–8.1)

## 2017-02-11 LAB — CBC WITH DIFFERENTIAL/PLATELET
Basophils Absolute: 0 10*3/uL (ref 0.0–0.1)
Basophils Relative: 0 %
EOS ABS: 0.2 10*3/uL (ref 0.0–0.7)
EOS PCT: 3 %
HCT: 45.7 % (ref 39.0–52.0)
Hemoglobin: 15.2 g/dL (ref 13.0–17.0)
LYMPHS ABS: 1.3 10*3/uL (ref 0.7–4.0)
Lymphocytes Relative: 19 %
MCH: 30.5 pg (ref 26.0–34.0)
MCHC: 33.3 g/dL (ref 30.0–36.0)
MCV: 91.6 fL (ref 78.0–100.0)
MONO ABS: 0.7 10*3/uL (ref 0.1–1.0)
MONOS PCT: 11 %
Neutro Abs: 4.5 10*3/uL (ref 1.7–7.7)
Neutrophils Relative %: 67 %
PLATELETS: 150 10*3/uL (ref 150–400)
RBC: 4.99 MIL/uL (ref 4.22–5.81)
RDW: 16.9 % — AB (ref 11.5–15.5)
WBC: 6.7 10*3/uL (ref 4.0–10.5)

## 2017-02-11 LAB — TSH: TSH: 0.964 u[IU]/mL (ref 0.350–4.500)

## 2017-02-11 MED ORDER — SODIUM CHLORIDE 0.9 % IV SOLN
10.2000 mg/kg | Freq: Once | INTRAVENOUS | Status: AC
  Administered 2017-02-11: 1100 mg via INTRAVENOUS
  Filled 2017-02-11: qty 20

## 2017-02-11 MED ORDER — HEPARIN SOD (PORK) LOCK FLUSH 100 UNIT/ML IV SOLN
500.0000 [IU] | Freq: Once | INTRAVENOUS | Status: AC
  Administered 2017-02-11: 500 [IU] via INTRAVENOUS

## 2017-02-11 MED ORDER — POTASSIUM CHLORIDE CRYS ER 20 MEQ PO TBCR
EXTENDED_RELEASE_TABLET | ORAL | Status: AC
  Filled 2017-02-11: qty 3

## 2017-02-11 MED ORDER — POTASSIUM CHLORIDE CRYS ER 20 MEQ PO TBCR
60.0000 meq | EXTENDED_RELEASE_TABLET | Freq: Once | ORAL | Status: AC
  Administered 2017-02-11: 60 meq via ORAL

## 2017-02-11 MED ORDER — ONDANSETRON 8 MG PO TBDP
8.0000 mg | ORAL_TABLET | Freq: Once | ORAL | Status: AC
  Administered 2017-02-11: 8 mg via ORAL
  Filled 2017-02-11: qty 1

## 2017-02-11 MED ORDER — SODIUM CHLORIDE 0.9 % IV SOLN
INTRAVENOUS | Status: DC
  Administered 2017-02-11: 10:00:00 via INTRAVENOUS

## 2017-02-11 MED ORDER — SODIUM CHLORIDE 0.9 % IV SOLN: Freq: Once | INTRAVENOUS | Status: DC

## 2017-02-11 NOTE — Progress Notes (Signed)
Labs reviewed with MD, proceed with treatment, will give potassium 21mq given per orders.   Chemotherapy given today per orders. Patient tolerated it well, no issues. Vitals stable and discharged home from clinic ambulatory.

## 2017-02-11 NOTE — Patient Instructions (Signed)
[facility] Discharge Instructions for Patients Receiving Chemotherapy   Beginning January 23rd 2017 lab work for the Cancer Center will be done in the  Main lab at [facility] on 1st floor. If you have a lab appointment with the Cancer Center please come in thru the  Main Entrance and check in at the main information desk   Today you received the following chemotherapy agents   To help prevent nausea and vomiting after your treatment, we encourage you to take your nausea medication     If you develop nausea and vomiting, or diarrhea that is not controlled by your medication, call the clinic.  The clinic phone number is [phone]. Office hours are Monday-Friday 8:30am-5:00pm.  BELOW ARE SYMPTOMS THAT SHOULD BE REPORTED IMMEDIATELY:  *FEVER GREATER THAN 101.0 F  *CHILLS WITH OR WITHOUT FEVER  NAUSEA AND VOMITING THAT IS NOT CONTROLLED WITH YOUR NAUSEA MEDICATION  *UNUSUAL SHORTNESS OF BREATH  *UNUSUAL BRUISING OR BLEEDING  TENDERNESS IN MOUTH AND THROAT WITH OR WITHOUT PRESENCE OF ULCERS  *URINARY PROBLEMS  *BOWEL PROBLEMS  UNUSUAL RASH Items with * indicate a potential emergency and should be followed up as soon as possible. If you have an emergency after office hours please contact your primary care physician or go to the nearest emergency department.  Please call the clinic during office hours if you have any questions or concerns.   You may also contact the Patient Navigator at [phone] should you have any questions or need assistance in obtaining follow up care.      Resources For Cancer Patients and their Caregivers ? American Cancer Society: Can assist with transportation, wigs, general needs, runs Look Good Feel Better.        1-888-227-6333 ? Cancer Care: Provides financial assistance, online support groups, medication/co-pay assistance.  1-800-813-HOPE (4673) ? [facility] Assists [place] cancer  patients and their families through emotional , educational and financial support.  336-427-4357 ? [place] DSS Where to apply for food stamps, Medicaid and utility assistance. 336-342-1394 ? RCATS: Transportation to medical appointments. 336-347-2287 ? Social Security Administration: May apply for disability if have a Stage IV cancer. 336-342-7796 1-800-772-1213 ? [place] Aging, Disability and Transit Services: Assists with nutrition, care and transit needs. 336-349-2343         

## 2017-02-14 ENCOUNTER — Ambulatory Visit (HOSPITAL_COMMUNITY)
Admission: RE | Admit: 2017-02-14 | Discharge: 2017-02-14 | Disposition: A | Discharge status: Home / Self Care | Payer: Medicare Other | Source: Ambulatory Visit | Attending: Radiation Oncology | Admitting: Radiation Oncology

## 2017-02-14 DIAGNOSIS — R918 Other nonspecific abnormal finding of lung field: Secondary | ICD-10-CM | POA: Diagnosis not present

## 2017-02-14 DIAGNOSIS — C3431 Malignant neoplasm of lower lobe, right bronchus or lung: Secondary | ICD-10-CM | POA: Insufficient documentation

## 2017-02-14 DIAGNOSIS — J90 Pleural effusion, not elsewhere classified: Secondary | ICD-10-CM | POA: Diagnosis not present

## 2017-02-14 DIAGNOSIS — R590 Localized enlarged lymph nodes: Secondary | ICD-10-CM | POA: Insufficient documentation

## 2017-02-14 MED ORDER — IOPAMIDOL (ISOVUE-300) INJECTION 61%
75.0000 mL | Freq: Once | INTRAVENOUS | Status: AC | PRN
  Administered 2017-02-14: 75 mL via INTRAVENOUS

## 2017-02-15 ENCOUNTER — Encounter (HOSPITAL_COMMUNITY): Payer: Self-pay | Admitting: Anesthesiology

## 2017-02-15 ENCOUNTER — Telehealth: Payer: Self-pay

## 2017-02-15 ENCOUNTER — Encounter (HOSPITAL_COMMUNITY): Admission: RE | Payer: Self-pay | Source: Ambulatory Visit

## 2017-02-15 ENCOUNTER — Ambulatory Visit (HOSPITAL_COMMUNITY): Admission: RE | Admit: 2017-02-15 | Payer: Medicare Other | Source: Ambulatory Visit | Admitting: Internal Medicine

## 2017-02-15 SURGERY — COLONOSCOPY WITH PROPOFOL
Anesthesia: Monitor Anesthesia Care

## 2017-02-15 NOTE — Telephone Encounter (Signed)
noted 

## 2017-02-15 NOTE — Telephone Encounter (Signed)
Noted  

## 2017-02-15 NOTE — Telephone Encounter (Signed)
Pt was not able to make is TCS today due to the weather. He is coming back into the office on 03/08/17 @ 10:30 am and we are holding his a spot for his TCS on 03/21/17 @ 9:15 am.

## 2017-02-17 ENCOUNTER — Other Ambulatory Visit (HOSPITAL_COMMUNITY): Payer: Self-pay | Admitting: Oncology

## 2017-02-17 ENCOUNTER — Other Ambulatory Visit (HOSPITAL_COMMUNITY): Payer: Self-pay | Admitting: Hematology & Oncology

## 2017-02-17 DIAGNOSIS — C3491 Malignant neoplasm of unspecified part of right bronchus or lung: Secondary | ICD-10-CM

## 2017-02-18 ENCOUNTER — Other Ambulatory Visit (HOSPITAL_COMMUNITY): Payer: Self-pay | Admitting: Oncology

## 2017-02-18 DIAGNOSIS — K219 Gastro-esophageal reflux disease without esophagitis: Secondary | ICD-10-CM

## 2017-02-24 ENCOUNTER — Encounter (HOSPITAL_COMMUNITY): Payer: Self-pay | Admitting: Adult Health

## 2017-02-24 ENCOUNTER — Encounter (HOSPITAL_BASED_OUTPATIENT_CLINIC_OR_DEPARTMENT_OTHER): Payer: Medicare Other

## 2017-02-24 ENCOUNTER — Encounter (HOSPITAL_BASED_OUTPATIENT_CLINIC_OR_DEPARTMENT_OTHER): Payer: Medicare Other | Admitting: Adult Health

## 2017-02-24 VITALS — BP 122/70 | HR 75 | Temp 98.2°F | Resp 16 | Ht 72.0 in | Wt 228.5 lb

## 2017-02-24 DIAGNOSIS — C3491 Malignant neoplasm of unspecified part of right bronchus or lung: Secondary | ICD-10-CM | POA: Diagnosis not present

## 2017-02-24 DIAGNOSIS — K219 Gastro-esophageal reflux disease without esophagitis: Secondary | ICD-10-CM

## 2017-02-24 DIAGNOSIS — C3431 Malignant neoplasm of lower lobe, right bronchus or lung: Secondary | ICD-10-CM

## 2017-02-24 DIAGNOSIS — R05 Cough: Secondary | ICD-10-CM

## 2017-02-24 DIAGNOSIS — Z5112 Encounter for antineoplastic immunotherapy: Secondary | ICD-10-CM

## 2017-02-24 DIAGNOSIS — K208 Other esophagitis: Secondary | ICD-10-CM

## 2017-02-24 LAB — CBC WITH DIFFERENTIAL/PLATELET
BASOS ABS: 0 10*3/uL (ref 0.0–0.1)
Basophils Relative: 1 %
Eosinophils Absolute: 0.2 10*3/uL (ref 0.0–0.7)
Eosinophils Relative: 3 %
HEMATOCRIT: 44 % (ref 39.0–52.0)
Hemoglobin: 14.2 g/dL (ref 13.0–17.0)
LYMPHS PCT: 19 %
Lymphs Abs: 1.2 10*3/uL (ref 0.7–4.0)
MCH: 30 pg (ref 26.0–34.0)
MCHC: 32.3 g/dL (ref 30.0–36.0)
MCV: 92.8 fL (ref 78.0–100.0)
Monocytes Absolute: 0.5 10*3/uL (ref 0.1–1.0)
Monocytes Relative: 9 %
NEUTROS ABS: 4.2 10*3/uL (ref 1.7–7.7)
Neutrophils Relative %: 68 %
PLATELETS: 148 10*3/uL — AB (ref 150–400)
RBC: 4.74 MIL/uL (ref 4.22–5.81)
RDW: 17.3 % — ABNORMAL HIGH (ref 11.5–15.5)
WBC: 6.1 10*3/uL (ref 4.0–10.5)

## 2017-02-24 LAB — COMPREHENSIVE METABOLIC PANEL
ALBUMIN: 3.4 g/dL — AB (ref 3.5–5.0)
ALT: 27 U/L (ref 17–63)
ANION GAP: 6 (ref 5–15)
AST: 37 U/L (ref 15–41)
Alkaline Phosphatase: 139 U/L — ABNORMAL HIGH (ref 38–126)
BILIRUBIN TOTAL: 0.7 mg/dL (ref 0.3–1.2)
BUN: 8 mg/dL (ref 6–20)
CO2: 30 mmol/L (ref 22–32)
Calcium: 8.9 mg/dL (ref 8.9–10.3)
Chloride: 100 mmol/L — ABNORMAL LOW (ref 101–111)
Creatinine, Ser: 0.73 mg/dL (ref 0.61–1.24)
GFR calc Af Amer: >60 mL/min (ref >60)
Glucose, Bld: 150 mg/dL — ABNORMAL HIGH (ref 65–99)
POTASSIUM: 3.7 mmol/L (ref 3.5–5.1)
Sodium: 136 mmol/L (ref 135–145)
TOTAL PROTEIN: 7.2 g/dL (ref 6.5–8.1)

## 2017-02-24 LAB — TSH: TSH: 1.089 u[IU]/mL (ref 0.350–4.500)

## 2017-02-24 MED ORDER — ONDANSETRON 8 MG PO TBDP
8.0000 mg | ORAL_TABLET | Freq: Once | ORAL | Status: AC
  Administered 2017-02-24: 8 mg via ORAL
  Filled 2017-02-24: qty 1

## 2017-02-24 MED ORDER — HEPARIN SOD (PORK) LOCK FLUSH 100 UNIT/ML IV SOLN
500.0000 [IU] | Freq: Once | INTRAVENOUS | Status: AC
  Administered 2017-02-24: 500 [IU] via INTRAVENOUS

## 2017-02-24 MED ORDER — SODIUM CHLORIDE 0.9 % IV SOLN
10.0000 mg/kg | Freq: Once | INTRAVENOUS | Status: AC
  Administered 2017-02-24: 1080 mg via INTRAVENOUS
  Filled 2017-02-24: qty 1.6

## 2017-02-24 MED ORDER — SODIUM CHLORIDE 0.9 % IV SOLN: Freq: Once | INTRAVENOUS | Status: DC | PRN

## 2017-02-24 MED ORDER — PANTOPRAZOLE SODIUM 40 MG PO TBEC: 40.0000 mg | DELAYED_RELEASE_TABLET | Freq: Two times a day (BID) | ORAL | 3 refills | Status: DC

## 2017-02-24 MED ORDER — ONDANSETRON HCL 40 MG/20ML IJ SOLN: Freq: Once | INTRAMUSCULAR | Status: DC

## 2017-02-24 MED ORDER — SODIUM CHLORIDE 0.9 % IV SOLN
INTRAVENOUS | Status: DC
  Administered 2017-02-24: 12:00:00 via INTRAVENOUS

## 2017-02-24 NOTE — Patient Instructions (Addendum)
[place] at [facility] Discharge Instructions  RECOMMENDATIONS MADE BY THE CONSULTANT AND ANY TEST RESULTS WILL BE SENT TO YOUR REFERRING PHYSICIAN.  You were seen today by Mike Craze NP. Try using '4mg'$  nicotine gum for your urges. Protonix sent to your pharmacy/ CT scan in 3  Months. Follow up in 1 month.  Thank you for choosing [place] at [facility] to provide your oncology and hematology care.  To afford each patient quality time with our provider, please arrive at least 15 minutes before your scheduled appointment time.    If you have a lab appointment with the [facility] please come in thru the  Main Entrance and check in at the main information desk  You need to re-schedule your appointment should you arrive 10 or more minutes late.  We strive to give you quality time with our providers, and arriving late affects you and other patients whose appointments are after yours.  Also, if you no show three or more times for appointments you may be dismissed from the clinic at the providers discretion.     Again, thank you for choosing [facility].  Our hope is that these requests will decrease the amount of time that you wait before being seen by our physicians.       _____________________________________________________________  Should you have questions after your visit to [facility], please contact our office at [phone] between the hours of 8:30 a.m. and 4:30 p.m.  Voicemails left after 4:30 p.m. will not be returned until the following business day.  For prescription refill requests, have your pharmacy contact our office.       Resources For Cancer Patients and their Caregivers ? American Cancer Society: Can assist with transportation, wigs, general needs, runs Look Good Feel Better.        (229) 381-9479 ? Cancer Care: Provides financial assistance, online support groups, medication/co-pay  assistance.  1-800-813-HOPE (432) 505-4645) ? [facility] Assists [place] cancer patients and their families through emotional , educational and financial support.  (253) 179-8741 ? [place] DSS Where to apply for food stamps, Medicaid and utility assistance. 509-812-3078 ? RCATS: Transportation to medical appointments. (859) 570-1433 ? Social Security Administration: May apply for disability if have a Stage IV cancer. 252-022-1461 616-884-4019 ? [employer], Disability and Transit Services: Assists with nutrition, care and transit needs. [place] Support Programs: '@10RELATIVEDAYS'$ @ > Cancer Support Group  2nd Tuesday of the month 1pm-2pm, Journey Room  > Creative Journey  3rd Tuesday of the month 1130am-1pm, Journey Room  > Look Good Feel Better  1st Wednesday of the month 10am-12 noon, Journey Room (Call [place] to register [phone])

## 2017-02-24 NOTE — Progress Notes (Signed)
Chemotherapy given today per orders. Patient tolerated it well. Vitals stable and discharged home from clinic ambulatory. Follow up as scheduled.

## 2017-02-24 NOTE — Progress Notes (Signed)
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 Hypermetabolic thoracic nodal metastases, as above. Additional right perihilar hypermetabolism, indeterminate. Associated right middle lobe atelectasis/collapse.      07/19/2016 Procedure    Bronchoscopy with brushings and biopsies and endobronchial ultrasound with mediastinal lymph node aspirations by Dr. Roxan Hockey      07/21/2016 Pathology Results    Lung, biopsy, Right Middle Lobe - LUNG TISSUE WITH SQUAMOUS METAPLASIA. - NO MALIGNANCY IDENTIFIED.      07/21/2016 Pathology Results    FINE NEEDLE ASPIRATION, ENDOSCOPIC (A) LEVEL 7 (SPECIMEN 1 OF 3, COLLECTED ON 07/19/16): MALIGNANT CELLS CONSISTENT WITH ADENOCARCINOMA.      07/21/2016 Pathology Results    FINE NEEDLE ASPIRATION, EBUS, 4R, B (SPECIMEN 2 OF 3, COLLECTED 07/19/16): MALIGNANT CELLS CONSISTENT WITH ADENOCARCINOMA.      07/21/2016 Pathology Results    FINE NEEDLE ASPIRATION, EBUS, BRUSHING, RIGHT MIDDLE LOBE, D (SPECIMEN 3 OF 3, COLLECTED 07/19/16): MALIGNANT CELLS CONSISTENT WITH ADENOCARCINOMA.      07/22/2016 Imaging    MRI brain- No evidence of intracranial metastases.      08/17/2016 - 09/20/2016 Chemotherapy    The patient had palonosetron (ALOXI) injection 0.25 mg, 0.25 mg, Intravenous,  Once, 1 of 1 cycle  CISplatin (PLATINOL) 123 mg in sodium chloride 0.9 % 500 mL chemo infusion, 50 mg/m2 = 123 mg, Intravenous,  Once, 1 of 1 cycle  etoposide (VEPESID) 120 mg  in sodium chloride 0.9 % 500 mL chemo infusion, 50 mg/m2 = 120 mg, Intravenous,  Once, 1 of 1 cycle  fosaprepitant (EMEND) 150 mg, dexamethasone (DECADRON) 12 mg in sodium chloride 0.9 % 145 mL IVPB, , Intravenous,  Once, 1 of 1 cycle  ondansetron (ZOFRAN) 8 mg in sodium chloride 0.9 % 50 mL IVPB, , Intravenous,  Once, 2 of 6 cycles  for chemotherapy treatment.        08/17/2016 -  Radiation Therapy         11/15/2016 Imaging    CT CAP- Interval response to therapy. The previously demonstrated mediastinal and right hilar adenopathy and resulting right middle lobe atelectasis have all improved. 2. No discrete residual lung masses are identified. There is new multifocal ground-glass opacity within the right lower lobe, and to a lesser extent in the left upper lobe. These are probably inflammatory/treatment related. 3. No evidence of abdominopelvic metastatic disease. Stable prominent lymph nodes in the upper abdomen, likely reactive. 4. Decompressed mid SVC without specific signs of SVC occlusion.      12/02/2016 -  Chemotherapy    Imfinzi (durvalumab) immunotherapy every 2 weeks x up to 1 year      01/03/2017 Procedure    EGD by Dr. [name], colonoscopy aborted as "patient forgot to take other half of preparation). Esophagitis. likely radiation-induced ?Dilated.  Erythematous mucosa in the stomach. Biopsied. Normal duodenal bulb and second portion of the duodenum.        HISTORY OF PRESENT ILLNESS:  (From [name], PA-C's last note on  01/27/17)     INTERVAL HISTORY:  Mr. Jurgens 56 y.o. male returns for follow-up and consideration for next dose Imfinzi.   Overall, he reports "doing okay."  Reports fatigue, which is ongoing.  Denies any diarrhea, rash, new pain, or headaches.  Remains with productive cough with yellow sputum. Denies fever/chills.  He takes Mucinex "when I can afford it" and coricidine.  Endorses dyspnea on exertion; denies shortness of breath at rest.  He has  occasional dizziness when going from a seated to standing position. His appetite is "okay"; he is happy with his current weight. Endorses some dysgeusia since completing chemotherapy.  His heartburn is worse; this started during his concurrent chemoradiation.  States that his Nexium went from $3 to $45/month, which is expensive for him with his other medications. He is asking if there may be an alternative medication he could try.   He continues to work on his smoking cessation; currently smoking about 3/4 ppd; remains on Chantix (prescribed by his PCP).   He recently had CT imaging and would like to review those results today.    REVIEW OF SYSTEMS:  Review of Systems  Constitutional: Positive for fatigue. Negative for chills and fever.  HENT:  Negative.  Negative for trouble swallowing.   Eyes: Negative.   Respiratory: Positive for cough and shortness of breath (dyspnea on exertion).   Cardiovascular: Negative.  Negative for chest pain, leg swelling and palpitations.  Gastrointestinal: Negative for abdominal pain, blood in stool, constipation, diarrhea, nausea and vomiting.       + acid reflux/heartburn  Endocrine: Negative.   Genitourinary: Negative.  Negative for dysuria and hematuria.   Musculoskeletal: Positive for back pain (chronic pain; no new pain ).  Skin: Negative.  Negative for itching and rash.  Neurological: Positive for dizziness (particularly with position changes ). Negative for headaches.  Hematological: Bruises/bleeds easily ("I have really thin skin and I bruise easy." ).  Psychiatric/Behavioral: Negative.        Reports short-term memory loss since finishing chemo.      PAST MEDICAL/SURGICAL HISTORY:  Past Medical History:  Diagnosis Date  . Arnold-Chiari syndrome ([place])   . Arthritis   . Chronic back pain   . Depression   . GERD (gastroesophageal reflux disease)   . Seasonal allergies   . Spinal stenosis of lumbar region   . Squamous cell carcinoma of right  lung ([place]) 07/23/2016  . Wheezing    Past Surgical History:  Procedure Laterality Date  . BACK SURGERY     5 total  . BIOPSY  01/03/2017   Procedure: BIOPSY;  Surgeon: Daneil Dolin, MD;  [facility];  Service: Endoscopy;;  gastric  . ESOPHAGOGASTRODUODENOSCOPY (EGD) WITH PROPOFOL N/A 01/03/2017   Procedure: ESOPHAGOGASTRODUODENOSCOPY (EGD) WITH PROPOFOL;  Surgeon: Daneil Dolin, MD;  [facility];  Service: Endoscopy;  Laterality: N/A;  . Venia Minks DILATION N/A 01/03/2017   Procedure: Venia Minks DILATION;  Surgeon: Daneil Dolin, MD;  [facility];  Service: Endoscopy;  Laterality: N/A;  . MULTIPLE EXTRACTIONS WITH ALVEOLOPLASTY N/A 07/08/2014   Procedure: MULTIPLE EXTRACION WITH ALVEOLOPLASTY with EXCISION LESION RIGHT SIDE OF TONGUE;  Surgeon: Gae Bon, DDS;  Location: [facility];  Service: Oral Surgery;  Laterality: N/A;  . PORTACATH PLACEMENT Right 07/30/2016   Procedure: INSERTION PORT-A-CATH;  Surgeon: Vickie Epley, MD;  Location: [facility];  Service: Vascular;  Laterality: Right;  [name] VIDEO BRONCHOSCOPY WITH ENDOBRONCHIAL ULTRASOUND N/A 07/19/2016   Procedure:  VIDEO BRONCHOSCOPY WITH ENDOBRONCHIAL ULTRASOUND;  Surgeon: [name], MD;  Location: [facility];  Service: Thoracic;  Laterality: N/A;     SOCIAL HISTORY:  Social History   Social History  . Marital status: Divorced    Spouse name: N/A  . Number of children: N/A  . Years of education: N/A   Occupational History  . Not on file.   Social History Main Topics  . Smoking status: Current Every Day Smoker    Packs/day: 0.25    Years: 38.00    Types: Cigarettes  . Smokeless tobacco: Former Systems developer    Types: Snuff     Comment: using chantix- smoking once in a while  . Alcohol use 1.8 oz/week    3 Cans of beer per week     Comment: reports drinking 2-3 beers a week  . Drug use: Yes    Types: Hydrocodone  . Sexual activity: Not Currently   Other Topics Concern  . Not on file   Social  History Narrative  . No narrative on file    FAMILY HISTORY:  Family History  Problem Relation Age of Onset  . Cancer Mother     breast cancer  . Heart failure Brother   . Colon cancer Neg Hx     not sure, ?grandfather and/or uncle    CURRENT MEDICATIONS:  Outpatient Encounter Prescriptions as of 02/24/2017  Medication Sig Note  . albuterol (PROVENTIL HFA;VENTOLIN HFA) 108 (90 BASE) MCG/ACT inhaler Inhale 2 puffs into the lungs every 6 (six) hours as needed for wheezing.    [name] ALPRAZolam (XANAX) 1 MG tablet Take 1 mg by mouth 2 (two) times daily as needed for anxiety.    [name] amLODipine (NORVASC) 5 MG tablet Take 5 mg by mouth daily.    . Artificial Tear Solution (SOOTHE XP OP) Place 1-2 drops into both eyes 4 (four) times daily as needed (for dry/irritated eyes.).   [name] budesonide-formoterol (SYMBICORT) 160-4.5 MCG/ACT inhaler Inhale 1 puff into the lungs 2 (two) times daily.    [name] Dextromethorphan-Guaifenesin (CORICIDIN HBP CONGESTION/COUGH PO) Take 1 tablet by mouth 2 (two) times daily as needed (congestion).   [place] Oris IV Inject into the vein. Every 2 weeks   . escitalopram (LEXAPRO) 20 MG tablet Take 20 mg by mouth daily.    [name] esomeprazole (NEXIUM) 40 MG capsule TAKE (1) CAPSULE BY MOUTH TWICE DAILY BEFORE A MEAL.   [name] gabapentin (NEURONTIN) 300 MG capsule Take 600 mg by mouth 3 (three) times daily.    [name] guaiFENesin (MUCINEX) 600 MG 12 hr tablet Take 600 mg by mouth 2 (two) times daily as needed (for congestion.).   [name] lidocaine (XYLOCAINE) 2 % solution Use as directed 20 mLs in the mouth or throat every 6 (six) hours as needed (for mouth pain (used prior to eating when needed)).   [name] lidocaine-prilocaine (EMLA) cream Apply a quarter size amount to port site 1 hour prior to chemo. Do not rub in. Cover with plastic wrap. (Patient taking differently: Apply 1 application topically See admin instructions. Apply a quarter size amount to port site 1 hour prior to chemo. Do not rub in. Cover with  plastic wrap.)   . methadone (DOLOPHINE) 10 MG tablet Take 20 mg by mouth 5 (five) times daily. Takes 2 tablets 5 times daily   . Multiple Vitamin (MULTIVITAMIN WITH MINERALS) TABS tablet Take 1 tablet by mouth daily. Centrum   . Na Sulfate-K Sulfate-Mg Sulf (SUPREP BOWEL PREP KIT) 17.5-3.13-1.6 GM/180ML  SOLN Take 1 kit by mouth as directed. 02/04/2017: Before colonoscopy.  . nystatin (MYCOSTATIN) 100000 UNIT/ML suspension Use as directed 5 mLs in the mouth or throat 5 (five) times daily as needed (for oral thrush/yeast).    . ondansetron (ZOFRAN) 8 MG tablet Take 1 tablet (8 mg total) by mouth every 8 (eight) hours as needed for nausea or vomiting.   . prochlorperazine (COMPAZINE) 10 MG tablet TAKE 1 TABLET EVERY 6 HOURS AS NEEDED FOR NAUSEA AND VOMITING.   [name] tiZANidine (ZANAFLEX) 4 MG tablet Take 4 mg by mouth 3 (three) times daily.    . varenicline (CHANTIX) 1 MG tablet Take 1 mg by mouth 2 (two) times daily.    Facility-Administered Encounter Medications as of 02/24/2017  Medication  . 0.9 %  sodium chloride infusion    ALLERGIES:  Allergies  Allergen Reactions  . Tetracyclines & Related Swelling and Other (See Comments)          PHYSICAL EXAM:  ECOG Performance status: 1 - Symptomatic, but independent.      Physical Exam  Constitutional: He is oriented to person, place, and time and well-developed, well-nourished, and in no distress.  HENT:  Head: Normocephalic.  Mouth/Throat: No oropharyngeal exudate.  posterior oropharyngeal erythema likely secondary to cigarette smoking   Eyes: Conjunctivae are normal. Pupils are equal, round, and reactive to light. No scleral icterus.  Neck: Normal range of motion. Neck supple.  Cardiovascular: Normal rate, regular rhythm and normal heart sounds.   Pulmonary/Chest: Effort normal. No respiratory distress.  Course rhonchi noted to bilateral upper lobes; Diminished breath sounds to bilateral bases.   Abdominal: Bowel sounds are normal.  There is no tenderness. There is no rebound and no guarding.  Musculoskeletal: Normal range of motion. He exhibits no edema.  Lymphadenopathy:    He has no cervical adenopathy.  Neurological: He is alert and oriented to person, place, and time. Gait normal.  Skin: Skin is warm and dry. No rash noted.  Psychiatric: Mood, memory, affect and judgment normal.     LABORATORY DATA:  I have reviewed the labs as listed.  CBC    Component Value Date/Time   WBC 6.1 02/24/2017 1036   RBC 4.74 02/24/2017 1036   HGB 14.2 02/24/2017 1036   HCT 44.0 02/24/2017 1036   PLT 148 (L) 02/24/2017 1036   MCV 92.8 02/24/2017 1036   MCH 30.0 02/24/2017 1036   MCHC 32.3 02/24/2017 1036   RDW 17.3 (H) 02/24/2017 1036   LYMPHSABS 1.2 02/24/2017 1036   MONOABS 0.5 02/24/2017 1036   EOSABS 0.2 02/24/2017 1036   BASOSABS 0.0 02/24/2017 1036   CMP Latest Ref Rng & Units 02/24/2017 02/11/2017 01/27/2017  Glucose 65 - 99 mg/dL 150(H) 155(H) 141(H)  BUN 6 - 20 mg/dL '8 7 13  '$ Creatinine 0.61 - 1.24 mg/dL 0.73 0.74 0.79  Sodium 135 - 145 mmol/L 136 137 137  Potassium 3.5 - 5.1 mmol/L 3.7 3.3(L) 3.8  Chloride 101 - 111 mmol/L 100(L) 101 100(L)  CO2 22 - 32 mmol/L '30 28 30  '$ Calcium 8.9 - 10.3 mg/dL 8.9 9.1 8.9  Total Protein 6.5 - 8.1 g/dL 7.2 7.4 7.2  Total Bilirubin 0.3 - 1.2 mg/dL 0.7 0.8 0.7  Alkaline Phos 38 - 126 U/L 139(H) 142(H) 138(H)  AST 15 - 41 U/L 37 41 41  ALT 17 - 63 U/L 27 32 31    PENDING LABS:    DIAGNOSTIC IMAGING:  Most recent CT chest: 02/14/17  PATHOLOGY:  Cytopathology endoscopic FNA: 07/19/16    ASSESSMENT & PLAN:   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 Completed radiation therapy with Dr. Tammi Klippel in [place] at [facility].  Now on maintenance/consolidation Darvalumab every 2 weeks, beginning on 12/02/16 with plans to continue x 1 year.   -Recent CT chest on 02/14/17 reviewed in detail with patient today; findings suggest inflammatory/post-radiation/possibly infectious etiology.  We do not suspect findings are secondary to recurrent tumor. He will be due for repeat CT chest in 3 months for continued surveillance; we reviewed surveillance recommendations today.  -Labs reviewed and adequate for next dose of Imfinzi.  -Return as scheduled for Imfinzi.  -Return to cancer center in 1 month.  -CT chest with contrast in 3 months; orders placed today.   Cough/Dyspnea on exertion:  -Could be secondary to post-radiation changes, inflammation, and deconditioning secondary to recently completing concurrent chemoradiation.  Productive cough with yellow sputum could be secondary to tobacco use.  No fever/chills. WBCs normal.  We will continue to monitor.  We could consider referral to pulmonology for pulmonary rehab in the future.   Radiation-induced esophagitis/GERD: -Reports that the copay for his Nexium increased significantly; he is wondering if there are other options.  -E-prescribed Protonix 40 mg BID to his pharmacy; this medication is generic and should be affordable, but encouraged him to contact us if the drug was too expensive with his insurance.   Occasional episodes of dizziness:  -Worse with position changes, likely secondary to dehydration.  -Recommended he increase non-caffeinated fluid intake and to let us know if symptoms do not improve.   Wellness promotion:  -Patient is at his "goal weight." Recommended that he not attempt to lose weight during active treatment for cancer. Patients with inadequate nutrition during treatment for cancer often suffer more complications and adverse events. Encouraged him to increase his consumption of protein and calories during treatment.  -Dysgeusia is slowly improving s/p chemotherapy.   Memory changes:  -Could be secondary to chemotherapy and "chemo brain." Provided reassurance that this  generally improves with time.   Tobacco use disorder:  -Currently on Chantix (prescribed by PCP); smoking about 3/4 pack per day; down significantly from previous.  -Biggest triggers to smoke include: after meals and first thing in the morning with coffee. Discussed possible behavior modifications including setting "rules" for himself not to smoke inside.  Winter weather may be a deterrent for him if he abides by this "rule."  To help manage urges to smoke, I recommended he try OTC short-acting nicotine replacement with 4 mg lozenges or gum; he can use up to 8 gum/lozenges per day. Using nicotine replacement products with Chantix can be an effective in continued smoking cessation efforts.  Greater than 5 minutes was spent in smoking cessation counseling.      Dispo:  -Return as scheduled for Imfinzi.  -Return to cancer center for follow-up in 1 month.  -CT chest in 3 months; orders placed today.    All questions were answered to patient's stated satisfaction. Encouraged patient to call with any new concerns or questions before his next visit to the cancer center and we can certain see him sooner, if needed.    Plan of care discussed with Dr. Irene Limbo, who agrees with the above aforementioned.    Orders placed this encounter:  Orders Placed This Encounter  Procedures  . CT CHEST W CONTRAST  Mike Craze, NP [place] [phone]

## 2017-02-24 NOTE — Patient Instructions (Signed)
[facility] Discharge Instructions for Patients Receiving Chemotherapy   Beginning January 23rd 2017 lab work for the Cancer Center will be done in the  Main lab at [facility] on 1st floor. If you have a lab appointment with the Cancer Center please come in thru the  Main Entrance and check in at the main information desk   Today you received the following chemotherapy agents   To help prevent nausea and vomiting after your treatment, we encourage you to take your nausea medication     If you develop nausea and vomiting, or diarrhea that is not controlled by your medication, call the clinic.  The clinic phone number is [phone]. Office hours are Monday-Friday 8:30am-5:00pm.  BELOW ARE SYMPTOMS THAT SHOULD BE REPORTED IMMEDIATELY:  *FEVER GREATER THAN 101.0 F  *CHILLS WITH OR WITHOUT FEVER  NAUSEA AND VOMITING THAT IS NOT CONTROLLED WITH YOUR NAUSEA MEDICATION  *UNUSUAL SHORTNESS OF BREATH  *UNUSUAL BRUISING OR BLEEDING  TENDERNESS IN MOUTH AND THROAT WITH OR WITHOUT PRESENCE OF ULCERS  *URINARY PROBLEMS  *BOWEL PROBLEMS  UNUSUAL RASH Items with * indicate a potential emergency and should be followed up as soon as possible. If you have an emergency after office hours please contact your primary care physician or go to the nearest emergency department.  Please call the clinic during office hours if you have any questions or concerns.   You may also contact the Patient Navigator at [phone] should you have any questions or need assistance in obtaining follow up care.      Resources For Cancer Patients and their Caregivers ? American Cancer Society: Can assist with transportation, wigs, general needs, runs Look Good Feel Better.        1-888-227-6333 ? Cancer Care: Provides financial assistance, online support groups, medication/co-pay assistance.  1-800-813-HOPE (4673) ? [facility] Assists [place] cancer  patients and their families through emotional , educational and financial support.  336-427-4357 ? [place] DSS Where to apply for food stamps, Medicaid and utility assistance. 336-342-1394 ? RCATS: Transportation to medical appointments. 336-347-2287 ? Social Security Administration: May apply for disability if have a Stage IV cancer. 336-342-7796 1-800-772-1213 ? [place] Aging, Disability and Transit Services: Assists with nutrition, care and transit needs. 336-349-2343         

## 2017-03-08 ENCOUNTER — Encounter: Payer: Self-pay | Admitting: Nurse Practitioner

## 2017-03-08 ENCOUNTER — Telehealth: Payer: Self-pay

## 2017-03-08 ENCOUNTER — Ambulatory Visit (INDEPENDENT_AMBULATORY_CARE_PROVIDER_SITE_OTHER): Payer: Medicare Other | Admitting: Nurse Practitioner

## 2017-03-08 ENCOUNTER — Other Ambulatory Visit: Payer: Self-pay

## 2017-03-08 VITALS — BP 135/91 | HR 97 | Temp 99.0°F | Ht 72.0 in | Wt 222.2 lb

## 2017-03-08 DIAGNOSIS — K208 Other esophagitis without bleeding: Secondary | ICD-10-CM

## 2017-03-08 DIAGNOSIS — R69 Illness, unspecified: Secondary | ICD-10-CM

## 2017-03-08 DIAGNOSIS — Z1211 Encounter for screening for malignant neoplasm of colon: Secondary | ICD-10-CM

## 2017-03-08 NOTE — Progress Notes (Signed)
cc'ed to pcp °

## 2017-03-08 NOTE — Patient Instructions (Signed)
1. We will schedule your colonoscopy for you. 2. Further recommendations to be made after the results of your procedure. 3. Return for follow-up in 2 months to evaluate your upper GI symptoms. 4. Call if any worsening or severe symptoms.

## 2017-03-08 NOTE — Assessment & Plan Note (Signed)
Radiation-induced esophagitis. His GERD-like symptoms are not very well controlled. Nexium worked somewhat but his insurance changed his medication due to formulary. Has tried Prilosec, Nexium, and is now on Protonix once a day which he has only had for 2 weeks and it is not very effective. He decided he would like to try an additional couple weeks of Protonix and if his symptoms are not improved he will let us know in order for [place] to consider other medication changes. Return for follow-up in 2 months.
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
## 2017-03-08 NOTE — Telephone Encounter (Signed)
Called and informed pt of pre-op appt 03/30/17 at 11:00am. Letter also mailed.

## 2017-03-10 ENCOUNTER — Encounter (HOSPITAL_COMMUNITY): Payer: Self-pay

## 2017-03-10 ENCOUNTER — Encounter (HOSPITAL_COMMUNITY): Payer: Medicare Other | Attending: Hematology

## 2017-03-10 VITALS — BP 123/72 | HR 78 | Temp 98.3°F | Resp 18 | Wt 225.0 lb

## 2017-03-10 DIAGNOSIS — C3431 Malignant neoplasm of lower lobe, right bronchus or lung: Secondary | ICD-10-CM | POA: Diagnosis not present

## 2017-03-10 DIAGNOSIS — C3491 Malignant neoplasm of unspecified part of right bronchus or lung: Secondary | ICD-10-CM | POA: Diagnosis not present

## 2017-03-10 DIAGNOSIS — D751 Secondary polycythemia: Secondary | ICD-10-CM | POA: Diagnosis not present

## 2017-03-10 DIAGNOSIS — Z5112 Encounter for antineoplastic immunotherapy: Secondary | ICD-10-CM

## 2017-03-10 LAB — COMPREHENSIVE METABOLIC PANEL
ALBUMIN: 3.2 g/dL — AB (ref 3.5–5.0)
ALK PHOS: 130 U/L — AB (ref 38–126)
ALT: 20 U/L (ref 17–63)
AST: 22 U/L (ref 15–41)
Anion gap: 7 (ref 5–15)
BUN: 10 mg/dL (ref 6–20)
CALCIUM: 8.7 mg/dL — AB (ref 8.9–10.3)
CHLORIDE: 98 mmol/L — AB (ref 101–111)
CO2: 29 mmol/L (ref 22–32)
CREATININE: 0.67 mg/dL (ref 0.61–1.24)
GFR calc non Af Amer: >60 mL/min (ref >60)
GLUCOSE: 94 mg/dL (ref 65–99)
Potassium: 3.8 mmol/L (ref 3.5–5.1)
SODIUM: 134 mmol/L — AB (ref 135–145)
Total Bilirubin: 0.6 mg/dL (ref 0.3–1.2)
Total Protein: 7.1 g/dL (ref 6.5–8.1)

## 2017-03-10 LAB — CBC WITH DIFFERENTIAL/PLATELET
BASOS PCT: 0 %
Basophils Absolute: 0 10*3/uL (ref 0.0–0.1)
Eosinophils Absolute: 0.1 10*3/uL (ref 0.0–0.7)
Eosinophils Relative: 1 %
HEMATOCRIT: 41.8 % (ref 39.0–52.0)
Hemoglobin: 13.9 g/dL (ref 13.0–17.0)
Lymphocytes Relative: 15 %
Lymphs Abs: 1.1 10*3/uL (ref 0.7–4.0)
MCH: 31.2 pg (ref 26.0–34.0)
MCHC: 33.3 g/dL (ref 30.0–36.0)
MCV: 93.7 fL (ref 78.0–100.0)
MONO ABS: 0.7 10*3/uL (ref 0.1–1.0)
MONOS PCT: 9 %
NEUTROS ABS: 5.6 10*3/uL (ref 1.7–7.7)
Neutrophils Relative %: 75 %
PLATELETS: 153 10*3/uL (ref 150–400)
RBC: 4.46 MIL/uL (ref 4.22–5.81)
RDW: 17.1 % — ABNORMAL HIGH (ref 11.5–15.5)
WBC: 7.4 10*3/uL (ref 4.0–10.5)

## 2017-03-10 LAB — TSH: TSH: 0.178 u[IU]/mL — ABNORMAL LOW (ref 0.350–4.500)

## 2017-03-10 MED ORDER — SODIUM CHLORIDE 0.9 % IV SOLN
10.0000 mg/kg | Freq: Once | INTRAVENOUS | Status: AC
  Administered 2017-03-10: 1080 mg via INTRAVENOUS
  Filled 2017-03-10: qty 20

## 2017-03-10 MED ORDER — SODIUM CHLORIDE 0.9 % IV SOLN: Freq: Once | INTRAVENOUS | Status: DC

## 2017-03-10 MED ORDER — SODIUM CHLORIDE 0.9% FLUSH
10.0000 mL | INTRAVENOUS | Status: DC | PRN
  Administered 2017-03-10: 10 mL via INTRAVENOUS
  Filled 2017-03-10: qty 10

## 2017-03-10 MED ORDER — HEPARIN SOD (PORK) LOCK FLUSH 100 UNIT/ML IV SOLN
500.0000 [IU] | Freq: Once | INTRAVENOUS | Status: AC
  Administered 2017-03-10: 500 [IU] via INTRAVENOUS
  Filled 2017-03-10: qty 5

## 2017-03-10 MED ORDER — SODIUM CHLORIDE 0.9 % IV SOLN
INTRAVENOUS | Status: DC
  Administered 2017-03-10: 15:00:00 via INTRAVENOUS

## 2017-03-10 MED ORDER — ONDANSETRON 8 MG PO TBDP
8.0000 mg | ORAL_TABLET | Freq: Once | ORAL | Status: AC
  Administered 2017-03-10: 8 mg via ORAL
  Filled 2017-03-10: qty 1

## 2017-03-10 NOTE — Patient Instructions (Signed)
[facility] Discharge Instructions for Patients Receiving Chemotherapy   Beginning January 23rd 2017 lab work for the Cancer Center will be done in the  Main lab at [facility] on 1st floor. If you have a lab appointment with the Cancer Center please come in thru the  Main Entrance and check in at the main information desk   Today you received the following chemotherapy agents Imfinzi. Follow-up as scheduled. Call clinic for any questions or concerns  To help prevent nausea and vomiting after your treatment, we encourage you to take your nausea medication   If you develop nausea and vomiting, or diarrhea that is not controlled by your medication, call the clinic.  The clinic phone number is [phone]. Office hours are Monday-Friday 8:30am-5:00pm.  BELOW ARE SYMPTOMS THAT SHOULD BE REPORTED IMMEDIATELY:  *FEVER GREATER THAN 101.0 F  *CHILLS WITH OR WITHOUT FEVER  NAUSEA AND VOMITING THAT IS NOT CONTROLLED WITH YOUR NAUSEA MEDICATION  *UNUSUAL SHORTNESS OF BREATH  *UNUSUAL BRUISING OR BLEEDING  TENDERNESS IN MOUTH AND THROAT WITH OR WITHOUT PRESENCE OF ULCERS  *URINARY PROBLEMS  *BOWEL PROBLEMS  UNUSUAL RASH Items with * indicate a potential emergency and should be followed up as soon as possible. If you have an emergency after office hours please contact your primary care physician or go to the nearest emergency department.  Please call the clinic during office hours if you have any questions or concerns.   You may also contact the Patient Navigator at [phone] should you have any questions or need assistance in obtaining follow up care.      Resources For Cancer Patients and their Caregivers ? American Cancer Society: Can assist with transportation, wigs, general needs, runs Look Good Feel Better.        1-888-227-6333 ? Cancer Care: Provides financial assistance, online support groups, medication/co-pay assistance.  1-800-813-HOPE  (4673) ? [facility] Assists [place] cancer patients and their families through emotional , educational and financial support.  336-427-4357 ? [place] DSS Where to apply for food stamps, Medicaid and utility assistance. 336-342-1394 ? RCATS: Transportation to medical appointments. 336-347-2287 ? Social Security Administration: May apply for disability if have a Stage IV cancer. 336-342-7796 1-800-772-1213 ? [place] Aging, Disability and Transit Services: Assists with nutrition, care and transit needs. [phone]         

## 2017-03-10 NOTE — Progress Notes (Signed)
Erik [name] tolerated Imfinzi infusion well without complaints or incident. Labs reviewed with Dr. Talbert Cage prior to administering this medication. VSS throughout infusion and upon discharge. Pt discharged self ambulatory in satisfactory condition

## 2017-03-24 ENCOUNTER — Encounter (HOSPITAL_COMMUNITY): Payer: Self-pay

## 2017-03-24 ENCOUNTER — Encounter (HOSPITAL_BASED_OUTPATIENT_CLINIC_OR_DEPARTMENT_OTHER): Payer: Medicare Other

## 2017-03-24 VITALS — BP 136/84 | HR 81 | Temp 98.6°F | Resp 18 | Wt 225.6 lb

## 2017-03-24 DIAGNOSIS — C3431 Malignant neoplasm of lower lobe, right bronchus or lung: Secondary | ICD-10-CM | POA: Diagnosis not present

## 2017-03-24 DIAGNOSIS — Z5112 Encounter for antineoplastic immunotherapy: Secondary | ICD-10-CM

## 2017-03-24 DIAGNOSIS — D751 Secondary polycythemia: Secondary | ICD-10-CM | POA: Diagnosis not present

## 2017-03-24 DIAGNOSIS — C3491 Malignant neoplasm of unspecified part of right bronchus or lung: Secondary | ICD-10-CM

## 2017-03-24 LAB — COMPREHENSIVE METABOLIC PANEL
ALBUMIN: 3.3 g/dL — AB (ref 3.5–5.0)
ALK PHOS: 151 U/L — AB (ref 38–126)
ALT: 25 U/L (ref 17–63)
ANION GAP: 8 (ref 5–15)
AST: 37 U/L (ref 15–41)
BUN: 7 mg/dL (ref 6–20)
CALCIUM: 8.9 mg/dL (ref 8.9–10.3)
CO2: 29 mmol/L (ref 22–32)
Chloride: 100 mmol/L — ABNORMAL LOW (ref 101–111)
Creatinine, Ser: 0.75 mg/dL (ref 0.61–1.24)
GFR calc Af Amer: >60 mL/min (ref >60)
GFR calc non Af Amer: >60 mL/min (ref >60)
Glucose, Bld: 115 mg/dL — ABNORMAL HIGH (ref 65–99)
Potassium: 3.7 mmol/L (ref 3.5–5.1)
SODIUM: 137 mmol/L (ref 135–145)
Total Bilirubin: 0.4 mg/dL (ref 0.3–1.2)
Total Protein: 7.1 g/dL (ref 6.5–8.1)

## 2017-03-24 LAB — CBC WITH DIFFERENTIAL/PLATELET
BASOS ABS: 0 10*3/uL (ref 0.0–0.1)
Basophils Relative: 0 %
EOS PCT: 2 %
Eosinophils Absolute: 0.1 10*3/uL (ref 0.0–0.7)
HCT: 43.1 % (ref 39.0–52.0)
HEMOGLOBIN: 14.4 g/dL (ref 13.0–17.0)
LYMPHS PCT: 16 %
Lymphs Abs: 1.1 10*3/uL (ref 0.7–4.0)
MCH: 31.3 pg (ref 26.0–34.0)
MCHC: 33.4 g/dL (ref 30.0–36.0)
MCV: 93.7 fL (ref 78.0–100.0)
MONO ABS: 0.5 10*3/uL (ref 0.1–1.0)
MONOS PCT: 8 %
NEUTROS ABS: 4.8 10*3/uL (ref 1.7–7.7)
Neutrophils Relative %: 74 %
Platelets: 135 10*3/uL — ABNORMAL LOW (ref 150–400)
RBC: 4.6 MIL/uL (ref 4.22–5.81)
RDW: 16.3 % — ABNORMAL HIGH (ref 11.5–15.5)
WBC: 6.6 10*3/uL (ref 4.0–10.5)

## 2017-03-24 LAB — TSH: TSH: 1.381 u[IU]/mL (ref 0.350–4.500)

## 2017-03-24 MED ORDER — SODIUM CHLORIDE 0.9 % IV SOLN
10.0000 mg/kg | Freq: Once | INTRAVENOUS | Status: AC
  Administered 2017-03-24: 1080 mg via INTRAVENOUS
  Filled 2017-03-24: qty 1.6

## 2017-03-24 MED ORDER — HEPARIN SOD (PORK) LOCK FLUSH 100 UNIT/ML IV SOLN
500.0000 [IU] | Freq: Once | INTRAVENOUS | Status: AC
  Administered 2017-03-24: 500 [IU] via INTRAVENOUS

## 2017-03-24 MED ORDER — ONDANSETRON 8 MG PO TBDP
8.0000 mg | ORAL_TABLET | Freq: Once | ORAL | Status: AC
  Administered 2017-03-24: 8 mg via ORAL

## 2017-03-24 MED ORDER — HEPARIN SOD (PORK) LOCK FLUSH 100 UNIT/ML IV SOLN
INTRAVENOUS | Status: AC
  Filled 2017-03-24: qty 5

## 2017-03-24 MED ORDER — SODIUM CHLORIDE 0.9 % IV SOLN
INTRAVENOUS | Status: DC
  Administered 2017-03-24: 13:00:00 via INTRAVENOUS

## 2017-03-24 MED ORDER — ONDANSETRON 8 MG PO TBDP
ORAL_TABLET | ORAL | Status: AC
  Filled 2017-03-24: qty 1

## 2017-03-24 MED ORDER — SODIUM CHLORIDE 0.9 % IV SOLN: Freq: Once | INTRAVENOUS | Status: DC

## 2017-03-24 MED ORDER — GI COCKTAIL ~~LOC~~
30.0000 mL | Freq: Once | ORAL | Status: AC
Start: 2017-03-24 — End: 2017-03-24
  Administered 2017-03-24: 30 mL via ORAL
  Filled 2017-03-24: qty 30

## 2017-03-24 NOTE — Patient Instructions (Signed)
[facility] Discharge Instructions for Patients Receiving Chemotherapy   Beginning January 23rd 2017 lab work for the Cancer Center will be done in the  Main lab at [facility] on 1st floor. If you have a lab appointment with the Cancer Center please come in thru the  Main Entrance and check in at the main information desk   Today you received the following chemotherapy agents   To help prevent nausea and vomiting after your treatment, we encourage you to take your nausea medication     If you develop nausea and vomiting, or diarrhea that is not controlled by your medication, call the clinic.  The clinic phone number is [phone]. Office hours are Monday-Friday 8:30am-5:00pm.  BELOW ARE SYMPTOMS THAT SHOULD BE REPORTED IMMEDIATELY:  *FEVER GREATER THAN 101.0 F  *CHILLS WITH OR WITHOUT FEVER  NAUSEA AND VOMITING THAT IS NOT CONTROLLED WITH YOUR NAUSEA MEDICATION  *UNUSUAL SHORTNESS OF BREATH  *UNUSUAL BRUISING OR BLEEDING  TENDERNESS IN MOUTH AND THROAT WITH OR WITHOUT PRESENCE OF ULCERS  *URINARY PROBLEMS  *BOWEL PROBLEMS  UNUSUAL RASH Items with * indicate a potential emergency and should be followed up as soon as possible. If you have an emergency after office hours please contact your primary care physician or go to the nearest emergency department.  Please call the clinic during office hours if you have any questions or concerns.   You may also contact the Patient Navigator at [phone] should you have any questions or need assistance in obtaining follow up care.      Resources For Cancer Patients and their Caregivers ? American Cancer Society: Can assist with transportation, wigs, general needs, runs Look Good Feel Better.        1-888-227-6333 ? Cancer Care: Provides financial assistance, online support groups, medication/co-pay assistance.  1-800-813-HOPE (4673) ? [facility] Assists [place] cancer  patients and their families through emotional , educational and financial support.  336-427-4357 ? [place] DSS Where to apply for food stamps, Medicaid and utility assistance. 336-342-1394 ? RCATS: Transportation to medical appointments. 336-347-2287 ? Social Security Administration: May apply for disability if have a Stage IV cancer. 336-342-7796 1-800-772-1213 ? [place] Aging, Disability and Transit Services: Assists with nutrition, care and transit needs. 336-349-2343         

## 2017-03-24 NOTE — Progress Notes (Signed)
Chemotherapy given today per orders. Patient tolerated it well without problems. Vitals stable and discharged home from clinic ambulatory.follow up as scheduled.

## 2017-03-29 ENCOUNTER — Encounter (HOSPITAL_COMMUNITY): Payer: Self-pay

## 2017-03-29 ENCOUNTER — Encounter (HOSPITAL_BASED_OUTPATIENT_CLINIC_OR_DEPARTMENT_OTHER): Payer: Medicare Other | Admitting: Oncology

## 2017-03-29 ENCOUNTER — Ambulatory Visit (HOSPITAL_COMMUNITY): Payer: Medicare Other

## 2017-03-29 VITALS — BP 117/76 | HR 87 | Temp 98.1°F | Resp 20 | Wt 221.2 lb

## 2017-03-29 DIAGNOSIS — C3431 Malignant neoplasm of lower lobe, right bronchus or lung: Secondary | ICD-10-CM

## 2017-03-29 DIAGNOSIS — C3491 Malignant neoplasm of unspecified part of right bronchus or lung: Secondary | ICD-10-CM

## 2017-03-29 NOTE — Patient Instructions (Signed)
[facility] at [facility] Discharge Instructions  RECOMMENDATIONS MADE BY THE CONSULTANT AND ANY TEST RESULTS WILL BE SENT TO YOUR REFERRING PHYSICIAN.  You were seen today by Dr. Twana First Follow up    Thank you for choosing [place] at [facility] to provide your oncology and hematology care.  To afford each patient quality time with our provider, please arrive at least 15 minutes before your scheduled appointment time.    If you have a lab appointment with the [facility] please come in thru the  Main Entrance and check in at the main information desk  You need to re-schedule your appointment should you arrive 10 or more minutes late.  We strive to give you quality time with our providers, and arriving late affects you and other patients whose appointments are after yours.  Also, if you no show three or more times for appointments you may be dismissed from the clinic at the providers discretion.     Again, thank you for choosing [facility].  Our hope is that these requests will decrease the amount of time that you wait before being seen by our physicians.       _____________________________________________________________  Should you have questions after your visit to [facility], please contact our office at [phone] between the hours of 8:30 a.m. and 4:30 p.m.  Voicemails left after 4:30 p.m. will not be returned until the following business day.  For prescription refill requests, have your pharmacy contact our office.       Resources For Cancer Patients and their Caregivers ? American Cancer Society: Can assist with transportation, wigs, general needs, runs Look Good Feel Better.        754-600-1545 ? Cancer Care: Provides financial assistance, online support groups, medication/co-pay assistance.  1-800-813-HOPE 225-764-5740) ? [facility] Assists [place] cancer patients  and their families through emotional , educational and financial support.  8203481392 ? [place] DSS Where to apply for food stamps, Medicaid and utility assistance. (818)363-5340 ? RCATS: Transportation to medical appointments. 763-421-6227 ? Social Security Administration: May apply for disability if have a Stage IV cancer. 267-535-6505 (506)335-8748 ? [employer], Disability and Transit Services: Assists with nutrition, care and transit needs. [facility] [place] Support Programs: '@10RELATIVEDAYS'$ @ > Cancer Support Group  2nd Tuesday of the month 1pm-2pm, Journey Room  > Creative Journey  3rd Tuesday of the month 1130am-1pm, Journey Room  > Look Good Feel Better  1st Wednesday of the month 10am-12 noon, Journey Room (Call [place] to register [phone])
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
## 2017-03-30 ENCOUNTER — Encounter (HOSPITAL_COMMUNITY): Payer: Self-pay

## 2017-03-30 ENCOUNTER — Encounter (HOSPITAL_COMMUNITY)
Admission: RE | Admit: 2017-03-30 | Discharge: 2017-03-30 | Disposition: A | Discharge status: Home / Self Care | Payer: Medicare Other | Source: Ambulatory Visit | Attending: Internal Medicine | Admitting: Internal Medicine

## 2017-03-30 DIAGNOSIS — F329 Major depressive disorder, single episode, unspecified: Secondary | ICD-10-CM | POA: Insufficient documentation

## 2017-03-30 DIAGNOSIS — F411 Generalized anxiety disorder: Secondary | ICD-10-CM | POA: Insufficient documentation

## 2017-03-30 DIAGNOSIS — C3491 Malignant neoplasm of unspecified part of right bronchus or lung: Secondary | ICD-10-CM | POA: Insufficient documentation

## 2017-03-30 DIAGNOSIS — M1990 Unspecified osteoarthritis, unspecified site: Secondary | ICD-10-CM | POA: Insufficient documentation

## 2017-03-30 DIAGNOSIS — Z01818 Encounter for other preprocedural examination: Secondary | ICD-10-CM | POA: Insufficient documentation

## 2017-03-30 DIAGNOSIS — M545 Low back pain: Secondary | ICD-10-CM | POA: Insufficient documentation

## 2017-03-30 DIAGNOSIS — K208 Other esophagitis: Secondary | ICD-10-CM | POA: Insufficient documentation

## 2017-03-30 DIAGNOSIS — R1310 Dysphagia, unspecified: Secondary | ICD-10-CM | POA: Insufficient documentation

## 2017-03-30 DIAGNOSIS — K219 Gastro-esophageal reflux disease without esophagitis: Secondary | ICD-10-CM | POA: Insufficient documentation

## 2017-03-30 DIAGNOSIS — J45909 Unspecified asthma, uncomplicated: Secondary | ICD-10-CM | POA: Insufficient documentation

## 2017-03-30 HISTORY — DX: Essential (primary) hypertension: I10

## 2017-03-30 HISTORY — DX: Unspecified asthma, uncomplicated: J45.909

## 2017-03-30 NOTE — Patient Instructions (Signed)
Erik [name]  03/30/2017     '@PREFPERIOPPHARMACY'$ @   Your procedure is scheduled on  04/04/2017   Report to [facility] at  69  A.M.  Call this number if you have problems the morning of surgery:  [phone]   Remember:  Do not eat food or drink liquids after midnight.  Take these medicines the morning of surgery with A SIP OF WATER  Xanax, amlodipine, lexapro, nexium, neurontin, methadone, zofran, protnix, compazine, zanaflex. Take your inhaler before you come.   Do not wear jewelry, make-up or nail polish.  Do not wear lotions, powders, or perfumes, or deoderant.  Do not shave 48 hours prior to surgery.  Men may shave face and neck.  Do not bring valuables to the hospital.  [facility] is not responsible for any belongings or valuables.  Contacts, dentures or bridgework may not be worn into surgery.  Leave your suitcase in the car.  After surgery it may be brought to your room.  For patients admitted to the hospital, discharge time will be determined by your treatment team.  Patients discharged the day of surgery will not be allowed to drive home.   Name and phone number of your driver:   family Special instructions:  Follow the diet and prep instructions given to you by Dr Roseanne Kaufman office.  Please read over the following fact sheets that you were given. Anesthesia Post-op Instructions and Care and Recovery After Surgery       Colonoscopy, Adult A colonoscopy is an exam to look at the entire large intestine. During the exam, a lubricated, bendable tube is inserted into the anus and then passed into the rectum, colon, and other parts of the large intestine. A colonoscopy is often done as a part of normal colorectal screening or in response to certain symptoms, such as anemia, persistent diarrhea, abdominal pain, and blood in the stool. The exam can help screen for and diagnose medical problems, including:  Tumors.  Polyps.  Inflammation.  Areas of  bleeding. Tell a health care provider about:  Any allergies you have.  All medicines you are taking, including vitamins, herbs, eye drops, creams, and over-the-counter medicines.  Any problems you or family members have had with anesthetic medicines.  Any blood disorders you have.  Any surgeries you have had.  Any medical conditions you have.  Any problems you have had passing stool. What are the risks? Generally, this is a safe procedure. However, problems may occur, including:  Bleeding.  A tear in the intestine.  A reaction to medicines given during the exam.  Infection (rare). What happens before the procedure? Eating and drinking restrictions  Follow instructions from your health care provider about eating and drinking, which may include:  A few days before the procedure - follow a low-fiber diet. Avoid nuts, seeds, dried fruit, raw fruits, and vegetables.  1-3 days before the procedure - follow a clear liquid diet. Drink only clear liquids, such as clear broth or bouillon, black coffee or tea, clear juice, clear soft drinks or sports drinks, gelatin dessert, and popsicles. Avoid any liquids that contain red or purple dye.  On the day of the procedure - do not eat or drink anything during the 2 hours before the procedure, or within the time period that your health care provider recommends. Bowel prep  If you were prescribed an oral bowel prep to clean out your colon:  Take it as  told by your health care provider. Starting the day before your procedure, you will need to drink a large amount of medicated liquid. The liquid will cause you to have multiple loose stools until your stool is almost clear or light green.  If your skin or anus gets irritated from diarrhea, you may use these to relieve the irritation:  Medicated wipes, such as adult wet wipes with aloe and vitamin E.  A skin soothing-product like petroleum jelly.  If you vomit while drinking the bowel prep,  take a break for up to 60 minutes and then begin the bowel prep again. If vomiting continues and you cannot take the bowel prep without vomiting, call your health care provider. General instructions   Ask your health care provider about changing or stopping your regular medicines. This is especially important if you are taking diabetes medicines or blood thinners.  Plan to have someone take you home from the hospital or clinic. What happens during the procedure?  An IV tube may be inserted into one of your veins.  You will be given medicine to help you relax (sedative).  To reduce your risk of infection:  Your health care team will wash or sanitize their hands.  Your anal area will be washed with soap.  You will be asked to lie on your side with your knees bent.  Your health care provider will lubricate a long, thin, flexible tube. The tube will have a camera and a light on the end.  The tube will be inserted into your anus.  The tube will be gently eased through your rectum and colon.  Air will be delivered into your colon to keep it open. You may feel some pressure or cramping.  The camera will be used to take images during the procedure.  A small tissue sample may be removed from your body to be examined under a microscope (biopsy). If any potential problems are found, the tissue will be sent to a lab for testing.  If small polyps are found, your health care provider may remove them and have them checked for cancer cells.  The tube that was inserted into your anus will be slowly removed. The procedure may vary among health care providers and hospitals. What happens after the procedure?  Your blood pressure, heart rate, breathing rate, and blood oxygen level will be monitored until the medicines you were given have worn off.  Do not drive for 24 hours after the exam.  You may have a small amount of blood in your stool.  You may pass gas and have mild abdominal cramping  or bloating due to the air that was used to inflate your colon during the exam.  It is up to you to get the results of your procedure. Ask your health care provider, or the department performing the procedure, when your results will be ready. This information is not intended to replace advice given to you by your health care provider. Make sure you discuss any questions you have with your health care provider. Document Released: 11/19/2000 Document Revised: 09/22/2016 Document Reviewed: 02/03/2016 Elsevier Interactive Patient Education  2017 [employer]  Colonoscopy, Adult, Care After This sheet gives you information about how to care for yourself after your procedure. Your health care provider may also give you more specific instructions. If you have problems or questions, contact your health care provider. What can I expect after the procedure? After the procedure, it is common to have:  A small amount  of blood in your stool for 24 hours after the procedure.  Some gas.  Mild abdominal cramping or bloating. Follow these instructions at home: General instructions    For the first 24 hours after the procedure:  Do not drive or use machinery.  Do not sign important documents.  Do not drink alcohol.  Do your regular daily activities at a slower pace than normal.  Eat soft, easy-to-digest foods.  Rest often.  Take over-the-counter or prescription medicines only as told by your health care provider.  It is up to you to get the results of your procedure. Ask your health care provider, or the department performing the procedure, when your results will be ready. Relieving cramping and bloating   Try walking around when you have cramps or feel bloated.  Apply heat to your abdomen as told by your health care provider. Use a heat source that your health care provider recommends, such as a moist heat pack or a heating pad.  Place a towel between your skin and the heat  source.  Leave the heat on for 20-30 minutes.  Remove the heat if your skin turns bright red. This is especially important if you are unable to feel pain, heat, or cold. You may have a greater risk of getting burned. Eating and drinking   Drink enough fluid to keep your urine clear or pale yellow.  Resume your normal diet as instructed by your health care provider. Avoid heavy or fried foods that are hard to digest.  Avoid drinking alcohol for as long as instructed by your health care provider. Contact a health care provider if:  You have blood in your stool 2-3 days after the procedure. Get help right away if:  You have more than a small spotting of blood in your stool.  You pass large blood clots in your stool.  Your abdomen is swollen.  You have nausea or vomiting.  You have a fever.  You have increasing abdominal pain that is not relieved with medicine. This information is not intended to replace advice given to you by your health care provider. Make sure you discuss any questions you have with your health care provider. Document Released: 07/06/2004 Document Revised: 08/16/2016 Document Reviewed: 02/03/2016 Elsevier Interactive Patient Education  2017 [place] Anesthesia is a term that refers to techniques, procedures, and medicines that help a person stay safe and comfortable during a medical procedure. Monitored anesthesia care, or sedation, is one type of anesthesia. Your anesthesia specialist may recommend sedation if you will be having a procedure that does not require you to be unconscious, such as:  Cataract surgery.  A dental procedure.  A biopsy.  A colonoscopy. During the procedure, you may receive a medicine to help you relax (sedative). There are three levels of sedation:  Mild sedation. At this level, you may feel awake and relaxed. You will be able to follow directions.  Moderate sedation. At this level, you will be  sleepy. You may not remember the procedure.  Deep sedation. At this level, you will be asleep. You will not remember the procedure. The more medicine you are given, the deeper your level of sedation will be. Depending on how you respond to the procedure, the anesthesia specialist may change your level of sedation or the type of anesthesia to fit your needs. An anesthesia specialist will monitor you closely during the procedure. Let your health care provider know about:  Any allergies you have.  All medicines you are taking, including vitamins, herbs, eye drops, creams, and over-the-counter medicines.  Any use of steroids (by mouth or as a cream).  Any problems you or family members have had with sedatives and anesthetic medicines.  Any blood disorders you have.  Any surgeries you have had.  Any medical conditions you have, such as sleep apnea.  Whether you are pregnant or may be pregnant.  Any use of cigarettes, alcohol, or street drugs. What are the risks? Generally, this is a safe procedure. However, problems may occur, including:  Getting too much medicine (oversedation).  Nausea.  Allergic reaction to medicines.  Trouble breathing. If this happens, a breathing tube may be used to help with breathing. It will be removed when you are awake and breathing on your own.  Heart trouble.  Lung trouble. Before the procedure Staying hydrated  Follow instructions from your health care provider about hydration, which may include:  Up to 2 hours before the procedure - you may continue to drink clear liquids, such as water, clear fruit juice, black coffee, and plain tea. Eating and drinking restrictions  Follow instructions from your health care provider about eating and drinking, which may include:  8 hours before the procedure - stop eating heavy meals or foods such as meat, fried foods, or fatty foods.  6 hours before the procedure - stop eating light meals or foods, such as  toast or cereal.  6 hours before the procedure - stop drinking milk or drinks that contain milk.  2 hours before the procedure - stop drinking clear liquids. Medicines  Ask your health care provider about:  Changing or stopping your regular medicines. This is especially important if you are taking diabetes medicines or blood thinners.  Taking medicines such as aspirin and ibuprofen. These medicines can thin your blood. Do not take these medicines before your procedure if your health care provider instructs you not to. Tests and exams  You will have a physical exam.  You may have blood tests done to show:  How well your kidneys and liver are working.  How well your blood can clot.  General instructions  Plan to have someone take you home from the hospital or clinic.  If you will be going home right after the procedure, plan to have someone with you for 24 hours. What happens during the procedure?  Your blood pressure, heart rate, breathing, level of pain and overall condition will be monitored.  An IV tube will be inserted into one of your veins.  Your anesthesia specialist will give you medicines as needed to keep you comfortable during the procedure. This may mean changing the level of sedation.  The procedure will be performed. After the procedure  Your blood pressure, heart rate, breathing rate, and blood oxygen level will be monitored until the medicines you were given have worn off.  Do not drive for 24 hours if you received a sedative.  You may:  Feel sleepy, clumsy, or nauseous.  Feel forgetful about what happened after the procedure.  Have a sore throat if you had a breathing tube during the procedure.  Vomit. This information is not intended to replace advice given to you by your health care provider. Make sure you discuss any questions you have with your health care provider. Document Released: 08/18/2005 Document Revised: 04/30/2016 Document Reviewed:  03/14/2016 Elsevier Interactive Patient Education  2017 [place], Care After These instructions provide you with information about caring for  yourself after your procedure. Your health care provider may also give you more specific instructions. Your treatment has been planned according to current medical practices, but problems sometimes occur. Call your health care provider if you have any problems or questions after your procedure. What can I expect after the procedure? After your procedure, it is common to:  Feel sleepy for several hours.  Feel clumsy and have poor balance for several hours.  Feel forgetful about what happened after the procedure.  Have poor judgment for several hours.  Feel nauseous or vomit.  Have a sore throat if you had a breathing tube during the procedure. Follow these instructions at home: For at least 24 hours after the procedure:    Do not:  Participate in activities in which you could fall or become injured.  Drive.  Use heavy machinery.  Drink alcohol.  Take sleeping pills or medicines that cause drowsiness.  Make important decisions or sign legal documents.  Take care of children on your own.  Rest. Eating and drinking   Follow the diet that is recommended by your health care provider.  If you vomit, drink water, juice, or soup when you can drink without vomiting.  Make sure you have little or no nausea before eating solid foods. General instructions   Have a responsible adult stay with you until you are awake and alert.  Take over-the-counter and prescription medicines only as told by your health care provider.  If you smoke, do not smoke without supervision.  Keep all follow-up visits as told by your health care provider. This is important. Contact a health care provider if:  You keep feeling nauseous or you keep vomiting.  You feel light-headed.  You develop a rash.  You have a  fever. Get help right away if:  You have trouble breathing. This information is not intended to replace advice given to you by your health care provider. Make sure you discuss any questions you have with your health care provider. Document Released: 03/14/2016 Document Revised: 07/14/2016 Document Reviewed: 03/14/2016 Elsevier Interactive Patient Education  2017 [employer].

## 2017-04-04 ENCOUNTER — Ambulatory Visit (HOSPITAL_COMMUNITY)
Admission: RE | Admit: 2017-04-04 | Discharge: 2017-04-04 | Disposition: A | Discharge status: Home / Self Care | Payer: Medicare Other | Source: Ambulatory Visit | Attending: Internal Medicine | Admitting: Internal Medicine

## 2017-04-04 ENCOUNTER — Encounter (HOSPITAL_COMMUNITY)
Admission: RE | Disposition: A | Discharge status: Home / Self Care | Payer: Self-pay | Source: Ambulatory Visit | Attending: Internal Medicine

## 2017-04-04 ENCOUNTER — Ambulatory Visit (HOSPITAL_COMMUNITY): Payer: Medicare Other | Admitting: Anesthesiology

## 2017-04-04 ENCOUNTER — Encounter (HOSPITAL_COMMUNITY): Payer: Self-pay | Admitting: *Deleted

## 2017-04-04 DIAGNOSIS — Z538 Procedure and treatment not carried out for other reasons: Secondary | ICD-10-CM | POA: Diagnosis not present

## 2017-04-04 DIAGNOSIS — M48061 Spinal stenosis, lumbar region without neurogenic claudication: Secondary | ICD-10-CM | POA: Insufficient documentation

## 2017-04-04 DIAGNOSIS — Z1212 Encounter for screening for malignant neoplasm of rectum: Secondary | ICD-10-CM | POA: Diagnosis not present

## 2017-04-04 DIAGNOSIS — Z1211 Encounter for screening for malignant neoplasm of colon: Secondary | ICD-10-CM | POA: Insufficient documentation

## 2017-04-04 DIAGNOSIS — K210 Gastro-esophageal reflux disease with esophagitis: Secondary | ICD-10-CM | POA: Insufficient documentation

## 2017-04-04 DIAGNOSIS — Z79899 Other long term (current) drug therapy: Secondary | ICD-10-CM | POA: Diagnosis not present

## 2017-04-04 DIAGNOSIS — F419 Anxiety disorder, unspecified: Secondary | ICD-10-CM | POA: Diagnosis not present

## 2017-04-04 DIAGNOSIS — Z85118 Personal history of other malignant neoplasm of bronchus and lung: Secondary | ICD-10-CM | POA: Insufficient documentation

## 2017-04-04 DIAGNOSIS — M1990 Unspecified osteoarthritis, unspecified site: Secondary | ICD-10-CM | POA: Diagnosis not present

## 2017-04-04 DIAGNOSIS — E669 Obesity, unspecified: Secondary | ICD-10-CM | POA: Diagnosis not present

## 2017-04-04 DIAGNOSIS — Z923 Personal history of irradiation: Secondary | ICD-10-CM | POA: Insufficient documentation

## 2017-04-04 DIAGNOSIS — F329 Major depressive disorder, single episode, unspecified: Secondary | ICD-10-CM | POA: Diagnosis not present

## 2017-04-04 DIAGNOSIS — F17210 Nicotine dependence, cigarettes, uncomplicated: Secondary | ICD-10-CM | POA: Diagnosis not present

## 2017-04-04 DIAGNOSIS — I10 Essential (primary) hypertension: Secondary | ICD-10-CM | POA: Diagnosis not present

## 2017-04-04 DIAGNOSIS — Z6830 Body mass index (BMI) 30.0-30.9, adult: Secondary | ICD-10-CM | POA: Diagnosis not present

## 2017-04-04 DIAGNOSIS — G8929 Other chronic pain: Secondary | ICD-10-CM | POA: Insufficient documentation

## 2017-04-04 HISTORY — PX: COLONOSCOPY WITH PROPOFOL: SHX5780

## 2017-04-04 SURGERY — COLONOSCOPY WITH PROPOFOL
Anesthesia: Monitor Anesthesia Care

## 2017-04-04 MED ORDER — LACTATED RINGERS IV SOLN
INTRAVENOUS | Status: DC
  Administered 2017-04-04: 08:00:00 via INTRAVENOUS

## 2017-04-04 MED ORDER — MIDAZOLAM HCL 2 MG/2ML IJ SOLN
1.0000 mg | INTRAMUSCULAR | Status: AC
Start: 2017-04-04 — End: 2017-04-04
  Administered 2017-04-04 (×2): 2 mg via INTRAVENOUS
  Filled 2017-04-04: qty 2

## 2017-04-04 MED ORDER — GLYCOPYRROLATE 0.2 MG/ML IJ SOLN
0.2000 mg | Freq: Once | INTRAMUSCULAR | Status: AC | PRN
  Administered 2017-04-04: 0.2 mg via INTRAVENOUS

## 2017-04-04 MED ORDER — MIDAZOLAM HCL 2 MG/2ML IJ SOLN
INTRAMUSCULAR | Status: AC
  Filled 2017-04-04: qty 2

## 2017-04-04 MED ORDER — FENTANYL CITRATE (PF) 100 MCG/2ML IJ SOLN
INTRAMUSCULAR | Status: AC
  Filled 2017-04-04: qty 2

## 2017-04-04 MED ORDER — PROPOFOL 500 MG/50ML IV EMUL
INTRAVENOUS | Status: DC | PRN
  Administered 2017-04-04: 125 ug/kg/min via INTRAVENOUS
  Administered 2017-04-04: 08:00:00 via INTRAVENOUS

## 2017-04-04 MED ORDER — CHLORHEXIDINE GLUCONATE CLOTH 2 % EX PADS: 6.0000 | MEDICATED_PAD | Freq: Once | CUTANEOUS | Status: DC

## 2017-04-04 MED ORDER — GLYCOPYRROLATE 0.2 MG/ML IJ SOLN
INTRAMUSCULAR | Status: AC
  Filled 2017-04-04: qty 1

## 2017-04-04 MED ORDER — PROPOFOL 10 MG/ML IV BOLUS
INTRAVENOUS | Status: DC | PRN
  Administered 2017-04-04: 20 mg via INTRAVENOUS

## 2017-04-04 MED ORDER — FENTANYL CITRATE (PF) 100 MCG/2ML IJ SOLN
25.0000 ug | INTRAMUSCULAR | Status: AC
  Administered 2017-04-04 (×2): 25 ug via INTRAVENOUS

## 2017-04-04 MED ORDER — PROPOFOL 10 MG/ML IV BOLUS
INTRAVENOUS | Status: AC
  Filled 2017-04-04: qty 40

## 2017-04-04 NOTE — Anesthesia Preprocedure Evaluation (Signed)
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 on medications (-) anginanegative cardio ROS  Dysrhythmias: PVCs.  Rhythm:Regular Rate:Normal     Neuro/Psych PSYCHIATRIC DISORDERS Anxiety Depression Arnold-Chiari syndrome  negative neurological ROS     GI/Hepatic negative GI ROS, Neg liver ROS, GERD  Controlled and Medicated,  Endo/Other  Obesity  Renal/GU negative Renal ROS  negative genitourinary   Musculoskeletal  (+) Arthritis , Lumbar spinal stenosis   Abdominal (+) + obese,   Peds  Hematology negative hematology ROS (+)   Anesthesia Other Findings   Reproductive/Obstetrics                             Anesthesia Physical Anesthesia Plan  ASA: III  Anesthesia Plan: MAC   Post-op Pain Management:    Induction: Intravenous  Airway Management Planned: Simple Face Mask  Additional Equipment:   Intra-op Plan:   Post-operative Plan:   Informed Consent: I have reviewed the patients History and Physical, chart, labs and discussed the procedure including the risks, benefits and alternatives for the proposed anesthesia with the patient or authorized representative who has indicated his/her understanding and acceptance.     Plan Discussed with:   Anesthesia Plan Comments:         Anesthesia Quick Evaluation

## 2017-04-04 NOTE — Progress Notes (Signed)
Awake. Wants to go home. Restless. Wants clothes. Denies pain.

## 2017-04-04 NOTE — Discharge Instructions (Signed)
°  Colonoscopy Discharge Instructions  Read the instructions outlined below and refer to this sheet in the next few weeks. These discharge instructions provide you with general information on caring for yourself after you leave the hospital. Your doctor may also give you specific instructions. While your treatment has been planned according to the most current medical practices available, unavoidable complications occasionally occur. If you have any problems or questions after discharge, call Dr. [name] at [phone]. ACTIVITY  You may resume your regular activity, but move at a slower pace for the next 24 hours.   Take frequent rest periods for the next 24 hours.   Walking will help get rid of the air and reduce the bloated feeling in your belly (abdomen).   No driving for 24 hours (because of the medicine (anesthesia) used during the test).    Do not sign any important legal documents or operate any machinery for 24 hours (because of the anesthesia used during the test).  NUTRITION  Drink plenty of fluids.   You may resume your normal diet as instructed by your doctor.   Begin with a light meal and progress to your normal diet. Heavy or fried foods are harder to digest and may make you feel sick to your stomach (nauseated).   Avoid alcoholic beverages for 24 hours or as instructed.  MEDICATIONS  You may resume your normal medications unless your doctor tells you otherwise.  WHAT YOU CAN EXPECT TODAY  Some feelings of bloating in the abdomen.   Passage of more gas than usual.   Spotting of blood in your stool or on the toilet paper.  IF YOU HAD POLYPS REMOVED DURING THE COLONOSCOPY:  No aspirin products for 7 days or as instructed.   No alcohol for 7 days or as instructed.   Eat a soft diet for the next 24 hours.  FINDING OUT THE RESULTS OF YOUR TEST Not all test results are available during your visit. If your test results are not back during the visit, make an appointment  with your caregiver to find out the results. Do not assume everything is normal if you have not heard from your caregiver or the medical facility. It is important for you to follow up on all of your test results.  SEEK IMMEDIATE MEDICAL ATTENTION IF:  You have more than a spotting of blood in your stool.   Your belly is swollen (abdominal distention).   You are nauseated or vomiting.   You have a temperature over 101.   You have abdominal pain or discomfort that is severe or gets worse throughout the day.     Your colonoscopy preparation was inadequate. All of your colon could be seen today.  I recommend you return in one year and set up a repeat screening colonoscopy
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
## 2017-04-04 NOTE — Op Note (Signed)
[facility] Patient Name: Erik [name] Procedure Date: 04/04/2017 7:57 AM MRN: [id number] Date of Birth: 1961/11/06 Attending MD: Norvel Richards , MD CSN: [id number] Age: 56 Admit Type: Ambulatory Procedure:                Colonoscopy Indications:              Screening for colorectal malignant neoplasm Providers:                Norvel Richards, MD, Jeanann Lewandowsky. Sharon Seller, RN,                            Lurline Del, RN, Randa Spike, Technician Referring MD:              Medicines:                Propofol per Anesthesia Complications:            No immediate complications. Estimated Blood Loss:     Estimated blood loss: none. Procedure:                Pre-Anesthesia Assessment:                           - Prior to the procedure, a History and Physical                            was performed, and patient medications and                            allergies were reviewed. The patient's tolerance of                            previous anesthesia was also reviewed. The risks                            and benefits of the procedure and the sedation                            options and risks were discussed with the patient.                            All questions were answered, and informed consent                            was obtained. ASA Grade Assessment: III - A patient                            with severe systemic disease. After reviewing the                            risks and benefits, the patient was deemed in                            satisfactory condition to undergo the procedure.  After obtaining informed consent, the colonoscope                            was passed under direct vision. Throughout the                            procedure, the patient's blood pressure, pulse, and                            oxygen saturations were monitored continuously. The                            EC-[id number] ([id number]) scope was introduced through                             the and advanced to the the cecum, identified by                            appendiceal orifice and ileocecal valve. The                            patient tolerated the procedure well. The quality                            of the bowel preparation was inadequate. The                            ileocecal valve, appendiceal orifice, and rectum                            were photographed. Scope In: 8:15:12 AM Scope Out: 8:34:30 AM Scope Withdrawal Time: 0 hours 11 minutes 25 seconds  Total Procedure Duration: 0 hours 19 minutes 18 seconds  Findings:      The perianal and digital rectal examinations were normal.      Colon preparation INADEQUATE .quite a bit of corn other food debris       within lumen of colon which precluded complete examination. Only a       portion of the colonic mucosa could be visualized. Impression:               - Preparation of the colon was inadequate.                           - The entire examined could not be seen.                           - No specimens collected. Moderate Sedation:      Moderate (conscious) sedation was personally administered by an       anesthesia professional. The following parameters were monitored: oxygen       saturation, heart rate, blood pressure, respiratory rate, EKG, adequacy       of pulmonary ventilation, and response to care. Total physician       intraservice time was 22 minutes. Recommendation:           - Patient has a contact  number available for                            emergencies. The signs and symptoms of potential                            delayed complications were discussed with the                            patient. Return to normal activities tomorrow.                            Written discharge instructions were provided to the                            patient.                           - Resume previous diet.                           - Continue present medications.                            - Repeat colonoscopy in 1 year for screening                            purposes.                           - Return to GI office in 1 year. Procedure Code(s):        --- Professional ---                           [id number], Colonoscopy, flexible; diagnostic, including                            collection of specimen(s) by brushing or washing,                            when performed (separate procedure) Diagnosis Code(s):        --- Professional ---                           Z12.11, Encounter for screening for malignant                            neoplasm of colon CPT copyright 2016 American Medical Association. All rights reserved. The codes documented in this report are preliminary and upon coder review may  be revised to meet current compliance requirements. [name]. Dayn Barich, MD Norvel Richards, MD 04/04/2017 8:48:59 AM This report has been signed electronically. Number of Addenda: 0

## 2017-04-04 NOTE — Anesthesia Postprocedure Evaluation (Signed)
Anesthesia Post Note  Patient: Erik [name]  Procedure(s) Performed: Procedure(s) (LRB): COLONOSCOPY WITH PROPOFOL (N/A)  Patient location during evaluation: Short Stay Anesthesia Type: MAC Level of consciousness: awake and alert and oriented Pain management: pain level controlled Vital Signs Assessment: post-procedure vital signs reviewed and stable Respiratory status: spontaneous breathing Cardiovascular status: blood pressure returned to baseline Postop Assessment: no signs of nausea or vomiting Anesthetic complications: no     Last Vitals:  Vitals:   04/04/17 0850 04/04/17 0900  BP:  (!) 132/91  Pulse: (!) 110 79  Resp: 19 16  Temp:      Last Pain:  Vitals:   04/04/17 0900  TempSrc: Oral  PainSc:                  Zadrian Mccauley

## 2017-04-04 NOTE — Interval H&P Note (Signed)
History and Physical Interval Note:  04/04/2017 7:48 AM  Erik [name]  has presented today for surgery, with the diagnosis of screening colonoscopy  The various methods of treatment have been discussed with the patient and family. After consideration of risks, benefits and other options for treatment, the patient has consented to  Procedure(s) with comments: COLONOSCOPY WITH PROPOFOL (N/A) - 8:45am as a surgical intervention .  The patient's history has been reviewed, patient examined, no change in status, stable for surgery.  I have reviewed the patient's chart and labs.  Questions were answered to the patient's satisfaction.     Erik [name]  No change. Screening colonoscopy per plan.The risks, benefits, limitations, alternatives and imponderables have been reviewed with the patient. Questions have been answered. All parties are agreeable.

## 2017-04-04 NOTE — Transfer of Care (Signed)
Immediate Anesthesia Transfer of Care Note  Patient: Erik [name]  Procedure(s) Performed: Procedure(s) with comments: COLONOSCOPY WITH PROPOFOL (N/A) - 8:45am  Patient Location: PACU  Anesthesia Type:MAC  Level of Consciousness: awake  Airway & Oxygen Therapy: Patient Spontanous Breathing and Patient connected to nasal cannula oxygen  Post-op Assessment: Report given to RN  Post vital signs: Reviewed and stable  Last Vitals:  Vitals:   04/04/17 0755 04/04/17 0800  BP: 123/88 132/90  Pulse:    Resp: 15 (!) 0  Temp:      Last Pain:  Vitals:   04/04/17 0808  TempSrc:   PainSc: 3       Patients Stated Pain Goal: 6 (00/93/81 8299)  Complications: No apparent anesthesia complications

## 2017-04-06 ENCOUNTER — Encounter (HOSPITAL_COMMUNITY): Payer: Self-pay | Admitting: Internal Medicine

## 2017-04-07 ENCOUNTER — Encounter (HOSPITAL_COMMUNITY): Payer: Self-pay

## 2017-04-07 ENCOUNTER — Encounter (HOSPITAL_COMMUNITY): Payer: Medicare Other | Attending: Hematology

## 2017-04-07 ENCOUNTER — Other Ambulatory Visit (HOSPITAL_COMMUNITY): Payer: Self-pay | Admitting: Adult Health

## 2017-04-07 VITALS — BP 114/62 | HR 91 | Temp 98.5°F | Resp 18 | Wt 225.4 lb

## 2017-04-07 DIAGNOSIS — C3491 Malignant neoplasm of unspecified part of right bronchus or lung: Secondary | ICD-10-CM

## 2017-04-07 DIAGNOSIS — D751 Secondary polycythemia: Secondary | ICD-10-CM | POA: Insufficient documentation

## 2017-04-07 DIAGNOSIS — Z5112 Encounter for antineoplastic immunotherapy: Secondary | ICD-10-CM | POA: Diagnosis not present

## 2017-04-07 DIAGNOSIS — C3431 Malignant neoplasm of lower lobe, right bronchus or lung: Secondary | ICD-10-CM

## 2017-04-07 LAB — COMPREHENSIVE METABOLIC PANEL
ALBUMIN: 3.3 g/dL — AB (ref 3.5–5.0)
ALT: 18 U/L (ref 17–63)
ANION GAP: 9 (ref 5–15)
AST: 23 U/L (ref 15–41)
Alkaline Phosphatase: 143 U/L — ABNORMAL HIGH (ref 38–126)
BUN: 9 mg/dL (ref 6–20)
CHLORIDE: 98 mmol/L — AB (ref 101–111)
CO2: 27 mmol/L (ref 22–32)
Calcium: 8.9 mg/dL (ref 8.9–10.3)
Creatinine, Ser: 0.74 mg/dL (ref 0.61–1.24)
GFR calc Af Amer: >60 mL/min (ref >60)
GFR calc non Af Amer: >60 mL/min (ref >60)
GLUCOSE: 122 mg/dL — AB (ref 65–99)
POTASSIUM: 3.4 mmol/L — AB (ref 3.5–5.1)
SODIUM: 134 mmol/L — AB (ref 135–145)
Total Bilirubin: 0.9 mg/dL (ref 0.3–1.2)
Total Protein: 7.2 g/dL (ref 6.5–8.1)

## 2017-04-07 LAB — CBC WITH DIFFERENTIAL/PLATELET
BASOS ABS: 0 10*3/uL (ref 0.0–0.1)
BASOS PCT: 0 %
EOS ABS: 0.1 10*3/uL (ref 0.0–0.7)
Eosinophils Relative: 2 %
HEMATOCRIT: 42.8 % (ref 39.0–52.0)
Hemoglobin: 14.4 g/dL (ref 13.0–17.0)
Lymphocytes Relative: 16 %
Lymphs Abs: 1.4 10*3/uL (ref 0.7–4.0)
MCH: 31.4 pg (ref 26.0–34.0)
MCHC: 33.6 g/dL (ref 30.0–36.0)
MCV: 93.4 fL (ref 78.0–100.0)
MONO ABS: 0.8 10*3/uL (ref 0.1–1.0)
Monocytes Relative: 9 %
NEUTROS ABS: 6.7 10*3/uL (ref 1.7–7.7)
Neutrophils Relative %: 73 %
PLATELETS: 148 10*3/uL — AB (ref 150–400)
RBC: 4.58 MIL/uL (ref 4.22–5.81)
RDW: 16 % — AB (ref 11.5–15.5)
WBC: 9 10*3/uL (ref 4.0–10.5)

## 2017-04-07 LAB — TSH: TSH: 0.547 u[IU]/mL (ref 0.350–4.500)

## 2017-04-07 MED ORDER — SODIUM CHLORIDE 0.9 % IV SOLN
10.1000 mg/kg | Freq: Once | INTRAVENOUS | Status: AC
  Administered 2017-04-07: 1100 mg via INTRAVENOUS
  Filled 2017-04-07: qty 2

## 2017-04-07 MED ORDER — ONDANSETRON 8 MG PO TBDP
8.0000 mg | ORAL_TABLET | Freq: Once | ORAL | Status: AC
  Administered 2017-04-07: 8 mg via ORAL
  Filled 2017-04-07: qty 1

## 2017-04-07 MED ORDER — HEPARIN SOD (PORK) LOCK FLUSH 100 UNIT/ML IV SOLN
500.0000 [IU] | Freq: Once | INTRAVENOUS | Status: AC
  Administered 2017-04-07: 500 [IU] via INTRAVENOUS

## 2017-04-07 MED ORDER — SODIUM CHLORIDE 0.9% FLUSH
10.0000 mL | INTRAVENOUS | Status: DC | PRN
  Administered 2017-04-07: 10 mL via INTRAVENOUS
  Filled 2017-04-07: qty 10

## 2017-04-07 MED ORDER — SODIUM CHLORIDE 0.9 % IV SOLN: Freq: Once | INTRAVENOUS | Status: DC

## 2017-04-07 MED ORDER — SODIUM CHLORIDE 0.9 % IV SOLN
INTRAVENOUS | Status: DC
  Administered 2017-04-07: 12:00:00 via INTRAVENOUS

## 2017-04-07 NOTE — Progress Notes (Signed)
Erik [name] tolerated Imfinzi infusion well without complaints or incident. Labs reviewed with Dr. Talbert Cage prior to administering this medication.VSS throughout and after infusion. Pt discharged self ambulatory in satisfactory condition

## 2017-04-07 NOTE — Patient Instructions (Signed)
[facility] Discharge Instructions for Patients Receiving Chemotherapy   Beginning January 23rd 2017 lab work for the [facility] will be done in the  Main lab at [facility] on 1st floor. If you have a lab appointment with the [facility] please come in thru the  Main Entrance and check in at the main information desk   Today you received the following chemotherapy agents Imfinzi. Follow-up as scheduled. Call clinic for any questions or concerns  To help prevent nausea and vomiting after your treatment, we encourage you to take your nausea medication   If you develop nausea and vomiting, or diarrhea that is not controlled by your medication, call the clinic.  The clinic phone number is [phone]. Office hours are Monday-Friday 8:30am-5:00pm.  BELOW ARE SYMPTOMS THAT SHOULD BE REPORTED IMMEDIATELY:  *FEVER GREATER THAN 101.0 F  *CHILLS WITH OR WITHOUT FEVER  NAUSEA AND VOMITING THAT IS NOT CONTROLLED WITH YOUR NAUSEA MEDICATION  *UNUSUAL SHORTNESS OF BREATH  *UNUSUAL BRUISING OR BLEEDING  TENDERNESS IN MOUTH AND THROAT WITH OR WITHOUT PRESENCE OF ULCERS  *URINARY PROBLEMS  *BOWEL PROBLEMS  UNUSUAL RASH Items with * indicate a potential emergency and should be followed up as soon as possible. If you have an emergency after office hours please contact your primary care physician or go to the nearest emergency department.  Please call the clinic during office hours if you have any questions or concerns.   You may also contact the Patient Navigator at [phone] should you have any questions or need assistance in obtaining follow up care.      Resources For Cancer Patients and their Caregivers ? American Cancer Society: Can assist with transportation, wigs, general needs, runs Look Good Feel Better.        769 235 8261 ? Cancer Care: Provides financial assistance, online support groups, medication/co-pay assistance.  1-800-813-HOPE  305-562-7817) ? [facility] Assists [place] cancer patients and their families through emotional , educational and financial support.  6146714500 ? [place] DSS Where to apply for food stamps, Medicaid and utility assistance. 651-561-3409 ? [facility]: Transportation to medical appointments. (647) 596-9177 ? Social Security Administration: May apply for disability if have a Stage IV cancer. 408-012-7571 231-377-2845 ? [employer], Disability and Transit Services: Assists with nutrition, care and transit needs. 587-873-2637

## 2017-04-14 ENCOUNTER — Other Ambulatory Visit (HOSPITAL_COMMUNITY): Payer: Self-pay | Admitting: Pharmacist

## 2017-04-21 ENCOUNTER — Encounter (HOSPITAL_BASED_OUTPATIENT_CLINIC_OR_DEPARTMENT_OTHER): Payer: Medicare Other | Admitting: Oncology

## 2017-04-21 ENCOUNTER — Encounter (HOSPITAL_BASED_OUTPATIENT_CLINIC_OR_DEPARTMENT_OTHER): Payer: Medicare Other

## 2017-04-21 VITALS — BP 134/80 | HR 76 | Temp 98.7°F | Resp 20 | Wt 215.8 lb

## 2017-04-21 DIAGNOSIS — C3491 Malignant neoplasm of unspecified part of right bronchus or lung: Secondary | ICD-10-CM

## 2017-04-21 DIAGNOSIS — C342 Malignant neoplasm of middle lobe, bronchus or lung: Secondary | ICD-10-CM | POA: Diagnosis not present

## 2017-04-21 DIAGNOSIS — Z5112 Encounter for antineoplastic immunotherapy: Secondary | ICD-10-CM

## 2017-04-21 DIAGNOSIS — D751 Secondary polycythemia: Secondary | ICD-10-CM | POA: Diagnosis not present

## 2017-04-21 LAB — COMPREHENSIVE METABOLIC PANEL
ALBUMIN: 3.4 g/dL — AB (ref 3.5–5.0)
ALT: 21 U/L (ref 17–63)
AST: 28 U/L (ref 15–41)
Alkaline Phosphatase: 152 U/L — ABNORMAL HIGH (ref 38–126)
Anion gap: 7 (ref 5–15)
BUN: 8 mg/dL (ref 6–20)
CHLORIDE: 103 mmol/L (ref 101–111)
CO2: 27 mmol/L (ref 22–32)
CREATININE: 0.72 mg/dL (ref 0.61–1.24)
Calcium: 8.6 mg/dL — ABNORMAL LOW (ref 8.9–10.3)
GFR calc Af Amer: >60 mL/min (ref >60)
GFR calc non Af Amer: >60 mL/min (ref >60)
Glucose, Bld: 123 mg/dL — ABNORMAL HIGH (ref 65–99)
Potassium: 3.7 mmol/L (ref 3.5–5.1)
SODIUM: 137 mmol/L (ref 135–145)
Total Bilirubin: 0.5 mg/dL (ref 0.3–1.2)
Total Protein: 7.2 g/dL (ref 6.5–8.1)

## 2017-04-21 LAB — CBC WITH DIFFERENTIAL/PLATELET
Basophils Absolute: 0 10*3/uL (ref 0.0–0.1)
Basophils Relative: 0 %
EOS ABS: 0.2 10*3/uL (ref 0.0–0.7)
EOS PCT: 3 %
HCT: 45.1 % (ref 39.0–52.0)
HEMOGLOBIN: 15.1 g/dL (ref 13.0–17.0)
LYMPHS ABS: 1.5 10*3/uL (ref 0.7–4.0)
LYMPHS PCT: 23 %
MCH: 31.5 pg (ref 26.0–34.0)
MCHC: 33.5 g/dL (ref 30.0–36.0)
MCV: 94 fL (ref 78.0–100.0)
MONOS PCT: 7 %
Monocytes Absolute: 0.4 10*3/uL (ref 0.1–1.0)
Neutro Abs: 4.3 10*3/uL (ref 1.7–7.7)
Neutrophils Relative %: 67 %
Platelets: 139 10*3/uL — ABNORMAL LOW (ref 150–400)
RBC: 4.8 MIL/uL (ref 4.22–5.81)
RDW: 15.4 % (ref 11.5–15.5)
WBC: 6.3 10*3/uL (ref 4.0–10.5)

## 2017-04-21 LAB — TSH: TSH: 1.185 u[IU]/mL (ref 0.350–4.500)

## 2017-04-21 MED ORDER — HEPARIN SOD (PORK) LOCK FLUSH 100 UNIT/ML IV SOLN
500.0000 [IU] | Freq: Once | INTRAVENOUS | Status: AC
  Administered 2017-04-21: 500 [IU] via INTRAVENOUS

## 2017-04-21 MED ORDER — HEPARIN SOD (PORK) LOCK FLUSH 100 UNIT/ML IV SOLN
INTRAVENOUS | Status: AC
  Filled 2017-04-21: qty 5

## 2017-04-21 MED ORDER — ONDANSETRON HCL 8 MG PO TABS: ORAL_TABLET | ORAL | 3 refills | Status: DC

## 2017-04-21 MED ORDER — SODIUM CHLORIDE 0.9 % IV SOLN
Freq: Once | INTRAVENOUS | Status: DC
  Filled 2017-04-21: qty 4

## 2017-04-21 MED ORDER — ONDANSETRON 8 MG PO TBDP
8.0000 mg | ORAL_TABLET | Freq: Once | ORAL | Status: AC
Start: 2017-04-21 — End: 2017-04-21
  Administered 2017-04-21: 8 mg via ORAL

## 2017-04-21 MED ORDER — SODIUM CHLORIDE 0.9 % IV SOLN
INTRAVENOUS | Status: DC
  Administered 2017-04-21: 13:00:00 via INTRAVENOUS

## 2017-04-21 MED ORDER — DURVALUMAB 500 MG/10ML IV SOLN
1000.0000 mg | Freq: Once | INTRAVENOUS | Status: AC
  Administered 2017-04-21: 1000 mg via INTRAVENOUS
  Filled 2017-04-21: qty 20

## 2017-04-21 MED ORDER — ONDANSETRON 8 MG PO TBDP
ORAL_TABLET | ORAL | Status: AC
  Filled 2017-04-21: qty 1

## 2017-04-21 MED ORDER — PROCHLORPERAZINE MALEATE 10 MG PO TABS: ORAL_TABLET | ORAL | 3 refills | Status: DC

## 2017-04-21 NOTE — Progress Notes (Signed)
Labs reviewed with MD. Proceed with treatment. Chemotherapy given today per orders. Patient tolerated it well, vitals stable. Follow up as scheduled.

## 2017-04-21 NOTE — Progress Notes (Signed)
Erik Gravel, MD [place] [address]  Adenocarcinoma of right lung [facility]) - Plan: TSH, prochlorperazine (COMPAZINE) 10 MG tablet, ondansetron (ZOFRAN) 8 MG tablet  CURRENT THERAPY: Darvalumab every 2 weeks x 52 weeks beginning on 12/02/2016  INTERVAL HISTORY: Erik [name] 56 y.o. male returns for followup of Stage IIIA (T3N2M0) adenocarcinoma of right lung, diagnosed on RML biopsy by Dr. Roxan Hockey on 07/21/2016.  S/P concomitant chemoXRT with cisplatin/etoposide (08/17/2016- 09/20/2016).  Now on immunotherapy for up to 1 year in the consolidative setting per NCCN guidelines.   HPI Elements NSCLC  Location: Right lung  Quality: Adenocarcinoma  Severity: Stage IIIA  Duration: Dx on 07/21/2016  Context: S/P chemoXRT with cisplatin/etoposide (08/17/2016-09/20/2016).  Timing:   Modifying Factors:   Associated Signs & Symptoms:    He reports fatigue.  Fatigue is stable since completing chemoXRT and he denies any worsening or progression of fatigue since being on Imfinzi.  He denies any diarrhea.  He does note minimal constipation.  He denies any headaches.  His weight is down a few lbs, but he looks great.  He denies Side effects of Durvalumab: Peripheral edema, fatigue, skin rash, hyponatremia, constipation, decreased appetite, nausea, abdominal pain, colitis, diarrhea, tract infection, lymphocytopenia, infection, muscular skeletal pain, dyspnea, dyspnea on exertion, fever.  Review of Systems  Constitutional: Negative.  Negative for chills, fever and weight loss.  HENT: Negative.   Eyes: Negative.   Respiratory: Negative.  Negative for cough.   Cardiovascular: Negative.  Negative for chest pain.  Gastrointestinal: Negative.  Negative for blood in stool, constipation, diarrhea, melena, nausea and vomiting.  Genitourinary: Negative.   Musculoskeletal: Negative.   Skin: Negative.   Neurological: Negative.  Negative for weakness.  Endo/Heme/Allergies:  Negative.   Psychiatric/Behavioral: Negative.     Past Medical History:  Diagnosis Date  . Arnold-Chiari syndrome ([place])   . Arthritis   . Asthma   . Chronic back pain   . Depression   . GERD (gastroesophageal reflux disease)   . Hypertension   . Seasonal allergies   . Spinal stenosis of lumbar region   . 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 Wheezing     Past Surgical History:  Procedure Laterality Date  . BACK SURGERY     5 total  . BIOPSY  01/03/2017   Procedure: BIOPSY;  Surgeon: Daneil Dolin, MD;  [facility];  Service: Endoscopy;;  gastric  . COLONOSCOPY WITH PROPOFOL N/A 04/04/2017   Procedure: COLONOSCOPY WITH PROPOFOL;  Surgeon: Daneil Dolin, MD;  [facility];  Service: Endoscopy;  Laterality: N/A;  8:45am  . ESOPHAGOGASTRODUODENOSCOPY (EGD) WITH PROPOFOL N/A 01/03/2017   Procedure: ESOPHAGOGASTRODUODENOSCOPY (EGD) WITH PROPOFOL;  Surgeon: Daneil Dolin, MD;  [facility];  Service: Endoscopy;  Laterality: N/A;  . Venia Minks DILATION N/A 01/03/2017   Procedure: Venia Minks DILATION;  Surgeon: Daneil Dolin, MD;  [facility];  Service: Endoscopy;  Laterality: N/A;  . MULTIPLE EXTRACTIONS WITH ALVEOLOPLASTY N/A 07/08/2014   Procedure: MULTIPLE EXTRACION WITH ALVEOLOPLASTY with EXCISION LESION RIGHT SIDE OF TONGUE;  Surgeon: Gae Bon, DDS;  Location: [place];  Service: Oral Surgery;  Laterality: N/A;  . PORTACATH PLACEMENT Right 07/30/2016   Procedure: INSERTION PORT-A-CATH;  Surgeon: Vickie Epley, MD;  Location: [facility];  Service: Vascular;  Laterality: Right;  [name] VIDEO BRONCHOSCOPY WITH ENDOBRONCHIAL ULTRASOUND N/A 07/19/2016   Procedure: VIDEO BRONCHOSCOPY WITH ENDOBRONCHIAL  ULTRASOUND;  Surgeon: [name], MD;  Location: [facility] OR;  Service: Thoracic;  Laterality: N/A;    Family History  Problem Relation Age of Onset  . Cancer Mother        breast cancer  . Heart failure Brother   . Colon cancer  Neg Hx        not sure, ?grandfather and/or uncle    Social History   Social History  . Marital status: Divorced    Spouse name: N/A  . Number of children: N/A  . Years of education: N/A   Social History Main Topics  . Smoking status: Current Every Day Smoker    Packs/day: 0.25    Years: 38.00    Types: Cigarettes  . Smokeless tobacco: Former Systems developer    Types: Snuff     Comment: using chantix- smoking once in a while  . Alcohol use 1.2 oz/week    2 Cans of beer per week     Comment: reports drinking 0-2 beers a week  . Drug use: Yes    Frequency: 7.0 times per week    Types: Marijuana     Comment: daily for cancer  . Sexual activity: Not Currently   Other Topics Concern  . Not on file   Social History Narrative  . No narrative on file     PHYSICAL EXAMINATION  ECOG PERFORMANCE STATUS: 1 - Symptomatic but completely ambulatory  There were no vitals filed for this visit.  Vitals - 1 value per visit 6/78/9381  SYSTOLIC 017  DIASTOLIC 73  Pulse 93  Temperature 98.9  Respirations 18  Weight (lb) 215.8   GENERAL:alert, no distress, well nourished, well developed, comfortable, cooperative, smiling and unaccompanied, in chemo-recliner. SKIN: skin color, texture, turgor are normal, no rashes or significant lesions HEAD: Normocephalic, No masses, lesions, tenderness or abnormalities EYES: normal, EOMI, Conjunctiva are pink and non-injected EARS: External ears normal OROPHARYNX:lips, buccal mucosa, and tongue normal and mucous membranes are moist  NECK: supple, no adenopathy, trachea midline LYMPH:  no palpable lymphadenopathy BREAST:not examined LUNGS: clear to auscultation , decreased breath sounds HEART: regular rate & rhythm, no murmurs, no gallops, S1 normal and S2 normal ABDOMEN:abdomen soft, non-tender, normal bowel sounds and no masses or organomegaly BACK: Back symmetric, no curvature. EXTREMITIES:less then 2 second capillary refill, no joint deformities,  effusion, or inflammation, no skin discoloration, no cyanosis  NEURO: alert & oriented x 3 with fluent speech, no focal motor/sensory deficits, gait normal   LABORATORY DATA: CBC    Component Value Date/Time   WBC 6.3 04/21/2017 1200   RBC 4.80 04/21/2017 1200   HGB 15.1 04/21/2017 1200   HCT 45.1 04/21/2017 1200   PLT 139 (L) 04/21/2017 1200   MCV 94.0 04/21/2017 1200   MCH 31.5 04/21/2017 1200   MCHC 33.5 04/21/2017 1200   RDW 15.4 04/21/2017 1200   LYMPHSABS 1.5 04/21/2017 1200   MONOABS 0.4 04/21/2017 1200   EOSABS 0.2 04/21/2017 1200   BASOSABS 0.0 04/21/2017 1200      Chemistry      Component Value Date/Time   NA 134 (L) 04/07/2017 1045   K 3.4 (L) 04/07/2017 1045   CL 98 (L) 04/07/2017 1045   CO2 27 04/07/2017 1045   BUN 9 04/07/2017 1045   CREATININE 0.74 04/07/2017 1045      Component Value Date/Time   CALCIUM 8.9 04/07/2017 1045   ALKPHOS 143 (H) 04/07/2017 1045   AST 23 04/07/2017 1045   ALT 18  04/07/2017 1045   BILITOT 0.9 04/07/2017 1045        PENDING LABS:   RADIOGRAPHIC STUDIES:  No results found.   PATHOLOGY:    ASSESSMENT AND PLAN:  Adenocarcinoma of right lung (HCC) Stage IIIA (T3N2M0) adenocarcinoma of right lung, diagnosed on RML biopsy by Dr. Roxan Hockey on 07/21/2016.  S/P concomitant chemoXRT with cisplatin/etoposide (08/17/2016- 09/20/2016).  Now on immunotherapy for up to 1 year in the consolidative setting per NCCN guidelines.   Oncology history is updated.  Labs today: CBC diff, CMET.  I personally reviewed and went over laboratory results with the patient.  The results are noted within this dictation.  Labs satisfy treatment parameters today.  Nursing, in accordance with immunotherapy administration protocol, will monitor for acute side effects/toxicities associated with immunotherapy administration today.  Labs in 2 weeks: CBC diff, CMET, TSH. Labs in 4 weeks: CBC diff, CMET.  Smoking cessation education is  provided.  He denies any issues related to Imfinzi:  Peripheral edema, fatigue, skin rash, hyponatremia, constipation, decreased appetite, nausea, abdominal pain, colitis, diarrhea, tract infection, lymphocytopenia, infection, muscular skeletal pain, dyspnea, dyspnea on exertion, fever.  I personally reviewed and went over radiographic studies with the patient.  The results are noted within this dictation.  I personally reviewed the images in PACS.  CT chest in March 2018 demonstrated the development of a small to moderate right-sided pleural effusion with minimal loculation anteriorly, worsened right-sided aeration of right middle and lower lobe consolidation, development of a mild right paratracheal adenopathy most likely reactive.  CT imaging also demonstrates findings concerning for underlying cirrhosis.  CT chest with contrast is ordered and scheduled for 05/23/2017.  Will need to pay particular attention to abnormalities noted above.  He continues to have issues with GERD.  He was on Nexium which was effective.  Unfortunately, Nexium became cost-prohibitive and therefore, he was switched to Protonix.  He notes that it is not as effective.  I have recommended that he add Zantac or Pepcid to his anti-GERD regimen.  If this is ineffective, then I would not hesitate to try Prilosec, given his response to Nexium in the past.  Other option is Dexilant.  Unfortunately, his colonoscopy has been aborted x 2 due to poor prep.  This can be addressed later this year of next year.  Continue with treatment every 2 weeks.  Return in 4 weeks for follow-up after CT restaging.   ORDERS PLACED FOR THIS ENCOUNTER: Orders Placed This Encounter  Procedures  . TSH    MEDICATIONS PRESCRIBED THIS ENCOUNTER: Meds ordered this encounter  Medications  . prochlorperazine (COMPAZINE) 10 MG tablet    Sig: TAKE 1 TABLET EVERY 6 HOURS AS NEEDED FOR NAUSEA AND VOMITING.    Dispense:  30 tablet    Refill:  3     Order Specific Question:   Supervising Provider    Answer:   Brunetta Genera [[id number]]  . ondansetron (ZOFRAN) 8 MG tablet    Sig: TAKE 1 TABLET BY MOUTH EVERY 8 HOURS AS NEEDED FOR NAUSEA AND VOMITING.    Dispense:  30 tablet    Refill:  3    Order Specific Question:   Supervising Provider    Answer:   Brunetta Genera [[id number]]    THERAPY PLAN:  Continue with immunotherapy for up to 52 weeks.  All questions were answered. The patient knows to call the clinic with any problems, questions or concerns. We can certainly see the patient much sooner if necessary.  Patient and plan discussed with Dr. Twana First and she is in agreement with the aforementioned.   This note is electronically signed by: Doy Mince 04/21/2017 12:31 PM

## 2017-04-21 NOTE — Addendum Note (Signed)
Addended by: Berneta Levins on: 04/21/2017 04:09 PM   Modules accepted: Orders

## 2017-04-21 NOTE — Assessment & Plan Note (Addendum)
Stage IIIA (T3N2M0) adenocarcinoma of right lung, diagnosed on RML biopsy by Dr. Roxan Hockey on 07/21/2016.  S/P concomitant chemoXRT with cisplatin/etoposide (08/17/2016- 09/20/2016).  Now on immunotherapy for up to 1 year in the consolidative setting per NCCN guidelines.   Oncology history is updated.  Labs today: CBC diff, CMET.  I personally reviewed and went over laboratory results with the patient.  The results are noted within this dictation.  Labs satisfy treatment parameters today.  Nursing, in accordance with immunotherapy administration protocol, will monitor for acute side effects/toxicities associated with immunotherapy administration today.  Labs in 2 weeks: CBC diff, CMET, TSH. Labs in 4 weeks: CBC diff, CMET.  Smoking cessation education is provided.  He denies any issues related to Imfinzi:  Peripheral edema, fatigue, skin rash, hyponatremia, constipation, decreased appetite, nausea, abdominal pain, colitis, diarrhea, tract infection, lymphocytopenia, infection, muscular skeletal pain, dyspnea, dyspnea on exertion, fever.  I personally reviewed and went over radiographic studies with the patient.  The results are noted within this dictation.  I personally reviewed the images in PACS.  CT chest in March 2018 demonstrated the development of a small to moderate right-sided pleural effusion with minimal loculation anteriorly, worsened right-sided aeration of right middle and lower lobe consolidation, development of a mild right paratracheal adenopathy most likely reactive.  CT imaging also demonstrates findings concerning for underlying cirrhosis.  CT chest with contrast is ordered and scheduled for 05/23/2017.  Will need to pay particular attention to abnormalities noted above.  He continues to have issues with GERD.  He was on Nexium which was effective.  Unfortunately, Nexium became cost-prohibitive and therefore, he was switched to Protonix.  He notes that it is not as effective.   I have recommended that he add Zantac or Pepcid to his anti-GERD regimen.  If this is ineffective, then I would not hesitate to try Prilosec, given his response to Nexium in the past.  Other option is Dexilant.  Unfortunately, his colonoscopy has been aborted x 2 due to poor prep.  This can be addressed later this year of next year.  Continue with treatment every 2 weeks.  Return in 4 weeks for follow-up after CT restaging.

## 2017-04-21 NOTE — Patient Instructions (Signed)
[place] at [facility] Discharge Instructions  RECOMMENDATIONS MADE BY THE CONSULTANT AND ANY TEST RESULTS WILL BE SENT TO YOUR REFERRING PHYSICIAN.  You were seen today by [name] PA-C. Return in 2 weeks for labs and treatment . CT scan scheduled for June. Return in 4 weeks for labs, treatment and follow up.   Thank you for choosing [place] at [facility] to provide your oncology and hematology care.  To afford each patient quality time with our provider, please arrive at least 15 minutes before your scheduled appointment time.    If you have a lab appointment with the [facility] please come in thru the  Main Entrance and check in at the main information desk  You need to re-schedule your appointment should you arrive 10 or more minutes late.  We strive to give you quality time with our providers, and arriving late affects you and other patients whose appointments are after yours.  Also, if you no show three or more times for appointments you may be dismissed from the clinic at the providers discretion.     Again, thank you for choosing [facility].  Our hope is that these requests will decrease the amount of time that you wait before being seen by our physicians.       _____________________________________________________________  Should you have questions after your visit to [facility], please contact our office at [phone] between the hours of 8:30 a.m. and 4:30 p.m.  Voicemails left after 4:30 p.m. will not be returned until the following business day.  For prescription refill requests, have your pharmacy contact our office.       Resources For Cancer Patients and their Caregivers ? American Cancer Society: Can assist with transportation, wigs, general needs, runs Look Good Feel Better.        249-169-1659 ? Cancer Care: Provides financial assistance, online support groups,  medication/co-pay assistance.  1-800-813-HOPE 313-695-7701) ? [facility] Assists [place] cancer patients and their families through emotional , educational and financial support.  (215)074-2921 ? [place] DSS Where to apply for food stamps, Medicaid and utility assistance. 307-701-9109 ? [employer]: Transportation to medical appointments. (867)400-2125 ? Social Security Administration: May apply for disability if have a Stage IV cancer. 681-687-3894 8600098017 ? [employer], Disability and Transit Services: Assists with nutrition, care and transit needs. [place] Support Programs: '@10RELATIVEDAYS'$ @ > Cancer Support Group  2nd Tuesday of the month 1pm-2pm, Journey Room  > Creative Journey  3rd Tuesday of the month 1130am-1pm, Journey Room  > Look Good Feel Better  1st Wednesday of the month 10am-12 noon, Journey Room (Call [place] to register [phone])

## 2017-04-21 NOTE — Patient Instructions (Signed)
[facility] Discharge Instructions for Patients Receiving Chemotherapy   Beginning January 23rd 2017 lab work for the Cancer Center will be done in the  Main lab at [facility] on 1st floor. If you have a lab appointment with the Cancer Center please come in thru the  Main Entrance and check in at the main information desk   Today you received the following chemotherapy agents   To help prevent nausea and vomiting after your treatment, we encourage you to take your nausea medication     If you develop nausea and vomiting, or diarrhea that is not controlled by your medication, call the clinic.  The clinic phone number is [phone]. Office hours are Monday-Friday 8:30am-5:00pm.  BELOW ARE SYMPTOMS THAT SHOULD BE REPORTED IMMEDIATELY:  *FEVER GREATER THAN 101.0 F  *CHILLS WITH OR WITHOUT FEVER  NAUSEA AND VOMITING THAT IS NOT CONTROLLED WITH YOUR NAUSEA MEDICATION  *UNUSUAL SHORTNESS OF BREATH  *UNUSUAL BRUISING OR BLEEDING  TENDERNESS IN MOUTH AND THROAT WITH OR WITHOUT PRESENCE OF ULCERS  *URINARY PROBLEMS  *BOWEL PROBLEMS  UNUSUAL RASH Items with * indicate a potential emergency and should be followed up as soon as possible. If you have an emergency after office hours please contact your primary care physician or go to the nearest emergency department.  Please call the clinic during office hours if you have any questions or concerns.   You may also contact the Patient Navigator at [phone] should you have any questions or need assistance in obtaining follow up care.      Resources For Cancer Patients and their Caregivers ? American Cancer Society: Can assist with transportation, wigs, general needs, runs Look Good Feel Better.        1-888-227-6333 ? Cancer Care: Provides financial assistance, online support groups, medication/co-pay assistance.  1-800-813-HOPE (4673) ? [facility] Assists [place] cancer  patients and their families through emotional , educational and financial support.  336-427-4357 ? [place] DSS Where to apply for food stamps, Medicaid and utility assistance. 336-342-1394 ? RCATS: Transportation to medical appointments. 336-347-2287 ? Social Security Administration: May apply for disability if have a Stage IV cancer. 336-342-7796 1-800-772-1213 ? [place] [facility], Disability and Transit Services: Assists with nutrition, care and transit needs. 336-349-2343         

## 2017-05-05 ENCOUNTER — Encounter (HOSPITAL_BASED_OUTPATIENT_CLINIC_OR_DEPARTMENT_OTHER): Payer: Medicare Other

## 2017-05-05 VITALS — BP 119/72 | HR 78 | Temp 98.5°F | Resp 18 | Wt 220.2 lb

## 2017-05-05 DIAGNOSIS — C3491 Malignant neoplasm of unspecified part of right bronchus or lung: Secondary | ICD-10-CM

## 2017-05-05 DIAGNOSIS — Z5112 Encounter for antineoplastic immunotherapy: Secondary | ICD-10-CM | POA: Diagnosis present

## 2017-05-05 DIAGNOSIS — C342 Malignant neoplasm of middle lobe, bronchus or lung: Secondary | ICD-10-CM | POA: Diagnosis not present

## 2017-05-05 DIAGNOSIS — D751 Secondary polycythemia: Secondary | ICD-10-CM | POA: Diagnosis not present

## 2017-05-05 LAB — COMPREHENSIVE METABOLIC PANEL
ALT: 18 U/L (ref 17–63)
ANION GAP: 7 (ref 5–15)
AST: 24 U/L (ref 15–41)
Albumin: 3.3 g/dL — ABNORMAL LOW (ref 3.5–5.0)
Alkaline Phosphatase: 146 U/L — ABNORMAL HIGH (ref 38–126)
BUN: 7 mg/dL (ref 6–20)
CHLORIDE: 99 mmol/L — AB (ref 101–111)
CO2: 31 mmol/L (ref 22–32)
Calcium: 8.6 mg/dL — ABNORMAL LOW (ref 8.9–10.3)
Creatinine, Ser: 0.78 mg/dL (ref 0.61–1.24)
Glucose, Bld: 68 mg/dL (ref 65–99)
POTASSIUM: 3.9 mmol/L (ref 3.5–5.1)
Sodium: 137 mmol/L (ref 135–145)
Total Bilirubin: 0.5 mg/dL (ref 0.3–1.2)
Total Protein: 7.2 g/dL (ref 6.5–8.1)

## 2017-05-05 LAB — CBC WITH DIFFERENTIAL/PLATELET
Basophils Absolute: 0 10*3/uL (ref 0.0–0.1)
Basophils Relative: 0 %
EOS PCT: 2 %
Eosinophils Absolute: 0.1 10*3/uL (ref 0.0–0.7)
HCT: 42.7 % (ref 39.0–52.0)
Hemoglobin: 14.2 g/dL (ref 13.0–17.0)
LYMPHS ABS: 1.1 10*3/uL (ref 0.7–4.0)
LYMPHS PCT: 18 %
MCH: 31.6 pg (ref 26.0–34.0)
MCHC: 33.3 g/dL (ref 30.0–36.0)
MCV: 94.9 fL (ref 78.0–100.0)
MONO ABS: 0.4 10*3/uL (ref 0.1–1.0)
Monocytes Relative: 8 %
Neutro Abs: 4.2 10*3/uL (ref 1.7–7.7)
Neutrophils Relative %: 72 %
PLATELETS: 143 10*3/uL — AB (ref 150–400)
RBC: 4.5 MIL/uL (ref 4.22–5.81)
RDW: 15 % (ref 11.5–15.5)
WBC: 5.8 10*3/uL (ref 4.0–10.5)

## 2017-05-05 MED ORDER — ONDANSETRON 8 MG PO TBDP
8.0000 mg | ORAL_TABLET | Freq: Once | ORAL | Status: AC
  Administered 2017-05-05: 8 mg via ORAL
  Filled 2017-05-05: qty 1

## 2017-05-05 MED ORDER — HEPARIN SOD (PORK) LOCK FLUSH 100 UNIT/ML IV SOLN
500.0000 [IU] | Freq: Once | INTRAVENOUS | Status: AC
  Administered 2017-05-05: 500 [IU] via INTRAVENOUS
  Filled 2017-05-05: qty 5

## 2017-05-05 MED ORDER — SODIUM CHLORIDE 0.9 % IV SOLN
Freq: Once | INTRAVENOUS | Status: DC
  Filled 2017-05-05: qty 4

## 2017-05-05 MED ORDER — SODIUM CHLORIDE 0.9 % IV SOLN
10.2000 mg/kg | Freq: Once | INTRAVENOUS | Status: AC
  Administered 2017-05-05: 1000 mg via INTRAVENOUS
  Filled 2017-05-05: qty 20

## 2017-05-05 MED ORDER — SODIUM CHLORIDE 0.9% FLUSH
10.0000 mL | INTRAVENOUS | Status: DC | PRN
  Administered 2017-05-05: 10 mL via INTRAVENOUS
  Filled 2017-05-05: qty 10

## 2017-05-05 MED ORDER — SODIUM CHLORIDE 0.9 % IV SOLN
INTRAVENOUS | Status: DC
  Administered 2017-05-05: 11:00:00 via INTRAVENOUS

## 2017-05-05 NOTE — Patient Instructions (Signed)
[facility] Discharge Instructions for Patients Receiving Chemotherapy   Beginning January 23rd 2017 lab work for the [facility] will be done in the  Main lab at [facility] on 1st floor. If you have a lab appointment with the [facility] please come in thru the  Main Entrance and check in at the main information desk   Today you received the following chemotherapy agents Imfinzi. Follow-up as scheduled. Call clinic for any questions or concerns  To help prevent nausea and vomiting after your treatment, we encourage you to take your nausea medication   If you develop nausea and vomiting, or diarrhea that is not controlled by your medication, call the clinic.  The clinic phone number is [phone]. Office hours are Monday-Friday 8:30am-5:00pm.  BELOW ARE SYMPTOMS THAT SHOULD BE REPORTED IMMEDIATELY:  *FEVER GREATER THAN 101.0 F  *CHILLS WITH OR WITHOUT FEVER  NAUSEA AND VOMITING THAT IS NOT CONTROLLED WITH YOUR NAUSEA MEDICATION  *UNUSUAL SHORTNESS OF BREATH  *UNUSUAL BRUISING OR BLEEDING  TENDERNESS IN MOUTH AND THROAT WITH OR WITHOUT PRESENCE OF ULCERS  *URINARY PROBLEMS  *BOWEL PROBLEMS  UNUSUAL RASH Items with * indicate a potential emergency and should be followed up as soon as possible. If you have an emergency after office hours please contact your primary care physician or go to the nearest emergency department.  Please call the clinic during office hours if you have any questions or concerns.   You may also contact the Patient Navigator at [phone] should you have any questions or need assistance in obtaining follow up care.      Resources For Cancer Patients and their Caregivers ? American Cancer Society: Can assist with transportation, wigs, general needs, runs Look Good Feel Better.        769 235 8261 ? Cancer Care: Provides financial assistance, online support groups, medication/co-pay assistance.  1-800-813-HOPE  305-562-7817) ? [facility] Assists [place] cancer patients and their families through emotional , educational and financial support.  6146714500 ? [place] DSS Where to apply for food stamps, Medicaid and utility assistance. 651-561-3409 ? [facility]: Transportation to medical appointments. (647) 596-9177 ? Social Security Administration: May apply for disability if have a Stage IV cancer. 408-012-7571 231-377-2845 ? [employer], Disability and Transit Services: Assists with nutrition, care and transit needs. 587-873-2637

## 2017-05-05 NOTE — Progress Notes (Signed)
Erik [name] tolerated Imfinzi infusion well without complaints or incident. Labs reviewed with Dr. Talbert Cage prior to administering chemotherapy. VSS upon discharge. Pt discharged self ambulatory in satisfactory condition

## 2017-05-11 ENCOUNTER — Ambulatory Visit: Payer: Medicare Other | Admitting: Nurse Practitioner

## 2017-05-11 ENCOUNTER — Encounter: Payer: Self-pay | Admitting: Nurse Practitioner

## 2017-05-11 ENCOUNTER — Telehealth: Payer: Self-pay | Admitting: Nurse Practitioner

## 2017-05-11 NOTE — Progress Notes (Deleted)
Referring Provider: [name], MD Primary Care Physician:  [name], MD Primary GI:  Dr.   Rayne Du chief complaint on file.   HPI:   Erik [name] is a 56 y.o. male who presents for follow-up on GERD area and the patient was last seen in our office 03/08/2017 for radiation-induced esophagitis. Noted history of EGD for reflux symptoms starting after x-ray treatment/chemotherapy for lung cancer and odynophagia as well. Intermittent dysphagia. Found at 10 cm segment of focal esophagitis in the proximal esophagus with diffuse erythema and stomach, dilation was performed with [facility] dilator and esophagitis felt to be radiation-induced. His previously scheduled colonoscopy had to be canceled due to adverse weather/no. At his last visit he was doing well, GI symptoms resolved. Insurance denied Nexium and placed him on Protonix and has had regular breakthrough symptoms. Has also try Prilosec. Nexium wasn't very effective either. At the time of his last visit he was still undergoing chemotherapy but radiation have been completed. Recommended rescheduling colonoscopy, follow-up in 2 months to reevaluate GI symptoms, call if worsening symptoms.  Colonoscopy completed 04/04/2017 which found inadequate preparation, entire examined colon could not be seen with quite a bit of debris within the lumen of the colon. No specimens collected. Recommended resume previous diet, continue current medications, repeat colonoscopy in 1 year for screening purposes.  Last seen by oncology on 04/21/2017 by [name], PA. 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 Status post concrement and chemotherapy and x-ray therapy, now on immunotherapy for up to one year in the consolidative setting per guidelines. At the time of his last oncology appointment he denied any worsening or progression of fatigue, diarrhea. Weight down a few pounds but looks good, some minimal  constipation. CT imaging concerning for findings of underlying cirrhosis. Continues with GERD issues, was on Nexium which wasn't effective unfortunately became cost prohibitive and therefore was switched to Protonix. Noted to be not as effective. Recommended adding Zantac or Pepcid to his anti-GERD regimen. His colonoscopy was aborted 2 due to poor prep and recommended readdressing later the year per GI recommendations. Due for follow-up in 2 weeks for continued immunotherapy and 4 weeks for office visit for CT restaging  Today he states   Past Medical History:  Diagnosis Date  . Arnold-Chiari syndrome ([place])   . Arthritis   . Asthma   . Chronic back pain   . Depression   . GERD (gastroesophageal reflux disease)   . Hypertension   . Seasonal allergies   . Spinal stenosis of lumbar region   . 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 Wheezing     Past Surgical History:  Procedure Laterality Date  . BACK SURGERY     5 total  . BIOPSY  01/03/2017   Procedure: BIOPSY;  Surgeon: Daneil Dolin, MD;  [facility];  Service: Endoscopy;;  gastric  . COLONOSCOPY WITH PROPOFOL N/A 04/04/2017   Procedure: COLONOSCOPY WITH PROPOFOL;  Surgeon: Daneil Dolin, MD;  [facility];  Service: Endoscopy;  Laterality: N/A;  8:45am  . ESOPHAGOGASTRODUODENOSCOPY (EGD) WITH PROPOFOL N/A 01/03/2017   Procedure: ESOPHAGOGASTRODUODENOSCOPY (EGD) WITH PROPOFOL;  Surgeon: Daneil Dolin, MD;  [facility];  Service: Endoscopy;  Laterality: N/A;  . Venia Minks DILATION N/A 01/03/2017   Procedure: Venia Minks DILATION;  Surgeon: Daneil Dolin, MD;  [facility];  Service: Endoscopy;  Laterality: N/A;  . MULTIPLE  EXTRACTIONS WITH ALVEOLOPLASTY N/A 07/08/2014   Procedure: MULTIPLE EXTRACION WITH ALVEOLOPLASTY with EXCISION LESION RIGHT SIDE OF TONGUE;  Surgeon: Gae Bon, DDS;  Location: [facility];  Service: Oral Surgery;  Laterality: N/A;  . PORTACATH PLACEMENT Right  07/30/2016   Procedure: INSERTION PORT-A-CATH;  Surgeon: Vickie Epley, MD;  Location: [facility];  Service: Vascular;  Laterality: Right;  [name] VIDEO BRONCHOSCOPY WITH ENDOBRONCHIAL ULTRASOUND N/A 07/19/2016   Procedure: VIDEO BRONCHOSCOPY WITH ENDOBRONCHIAL ULTRASOUND;  Surgeon: [name], MD;  Location: [facility];  Service: Thoracic;  Laterality: N/A;    Current Outpatient Prescriptions  Medication Sig Dispense Refill  . albuterol (PROVENTIL HFA;VENTOLIN HFA) 108 (90 BASE) MCG/ACT inhaler Inhale 2 puffs into the lungs every 6 (six) hours as needed for wheezing.     [name] ALPRAZolam (XANAX) 1 MG tablet Take 1 mg by mouth 2 (two) times daily as needed for anxiety or sleep (takes 1 tablet and bedtime for sleep and 1 dose during the day only if needed).     [name] amLODipine (NORVASC) 5 MG tablet Take 5 mg by mouth daily.     . Artificial Tear Solution (SOOTHE XP) SOLN Apply 2 drops to eye daily.    . budesonide-formoterol (SYMBICORT) 160-4.5 MCG/ACT inhaler Inhale 2 puffs into the lungs daily.     [name] Dextromethorphan-Guaifenesin (CORICIDIN HBP CONGESTION/COUGH PO) Take 1 tablet by mouth 2 (two) times daily as needed (congestion).    [place] Oris IV Inject into the vein. Every 2 weeks    . escitalopram (LEXAPRO) 20 MG tablet Take 20 mg by mouth daily.     [name] gabapentin (NEURONTIN) 300 MG capsule Take 600 mg by mouth 3 (three) times daily.     [name] guaiFENesin (MUCINEX) 600 MG 12 hr tablet Take 600 mg by mouth 2 (two) times daily as needed (for congestion.).    [name] lidocaine (XYLOCAINE) 2 % solution Use as directed 20 mLs in the mouth or throat every 6 (six) hours as needed (for mouth pain (used prior to eating when needed)).    [name] lidocaine-prilocaine (EMLA) cream Apply a quarter size amount to port site 1 hour prior to chemo. Do not rub in. Cover with plastic wrap. (Patient taking differently: Apply 1 application topically See admin instructions. Apply a quarter size amount to port site 1 hour prior to chemo. Do  not rub in. Cover with plastic wrap.) 30 g 2  . methadone (DOLOPHINE) 10 MG tablet Take 20 mg by mouth 5 (five) times daily. Takes 2 tablets 5 times daily    . Multiple Vitamin (MULTIVITAMIN WITH MINERALS) TABS tablet Take 1 tablet by mouth daily. Centrum    . Na Sulfate-K Sulfate-Mg Sulf (SUPREP BOWEL PREP KIT) 17.5-3.13-1.6 GM/180ML SOLN Take 1 kit by mouth as directed. (Patient taking differently: Take 1 kit by mouth as directed. ) 1 Bottle 0  . ondansetron (ZOFRAN) 8 MG tablet TAKE 1 TABLET BY MOUTH EVERY 8 HOURS AS NEEDED FOR NAUSEA AND VOMITING. 30 tablet 3  . pantoprazole (PROTONIX) 40 MG tablet Take 1 tablet (40 mg total) by mouth 2 (two) times daily. 60 tablet 3  . prochlorperazine (COMPAZINE) 10 MG tablet TAKE 1 TABLET EVERY 6 HOURS AS NEEDED FOR NAUSEA AND VOMITING. 30 tablet 3  . tiZANidine (ZANAFLEX) 4 MG tablet Take 4 mg by mouth every 8 (eight) hours as needed for muscle spasms.     . varenicline (CHANTIX) 1 MG tablet Take 1 mg by mouth 2 (two) times daily.  No current facility-administered medications for this visit.    Facility-Administered Medications Ordered in Other Visits  Medication Dose Route Frequency Provider Last Rate Last Dose  . 0.9 %  sodium chloride infusion   Intravenous Continuous Baird Cancer, PA-C 10 mL/hr at 01/13/17 1330      Allergies as of 05/11/2017 - Review Complete 04/21/2017  Allergen Reaction Noted  . Tetracyclines & related Swelling and Other (See Comments) 09/25/2011    Family History  Problem Relation Age of Onset  . Cancer Mother        breast cancer  . Heart failure Brother   . Colon cancer Neg Hx        not sure, ?grandfather and/or uncle    Social History   Social History  . Marital status: Divorced    Spouse name: N/A  . Number of children: N/A  . Years of education: N/A   Social History Main Topics  . Smoking status: Current Every Day Smoker    Packs/day: 0.25    Years: 38.00    Types: Cigarettes  . Smokeless  tobacco: Former Systems developer    Types: Snuff     Comment: using chantix- smoking once in a while  . Alcohol use 1.2 oz/week    2 Cans of beer per week     Comment: reports drinking 0-2 beers a week  . Drug use: Yes    Frequency: 7.0 times per week    Types: Marijuana     Comment: daily for cancer  . Sexual activity: Not Currently   Other Topics Concern  . Not on file   Social History Narrative  . No narrative on file    Review of Systems: General: Negative for anorexia, weight loss, fever, chills, fatigue, weakness. Eyes: Negative for vision changes.  ENT: Negative for hoarseness, difficulty swallowing , nasal congestion. CV: Negative for chest pain, angina, palpitations, dyspnea on exertion, peripheral edema.  Respiratory: Negative for dyspnea at rest, dyspnea on exertion, cough, sputum, wheezing.  GI: See history of present illness. GU:  Negative for dysuria, hematuria, urinary incontinence, urinary frequency, nocturnal urination.  MS: Negative for joint pain, low back pain.  Derm: Negative for rash or itching.  Neuro: Negative for weakness, abnormal sensation, seizure, frequent headaches, memory loss, confusion.  Psych: Negative for anxiety, depression, suicidal ideation, hallucinations.  Endo: Negative for unusual weight change.  Heme: Negative for bruising or bleeding. Allergy: Negative for rash or hives.   Physical Exam: There were no vitals taken for this visit. General:   Alert and oriented. Pleasant and cooperative. Well-nourished and well-developed.  Head:  Normocephalic and atraumatic. Eyes:  Without icterus, sclera clear and conjunctiva pink.  Ears:  Normal auditory acuity. Mouth:  No deformity or lesions, oral mucosa pink.  Throat/Neck:  Supple, without mass or thyromegaly. Cardiovascular:  S1, S2 present without murmurs appreciated. Normal pulses noted. Extremities without clubbing or edema. Respiratory:  Clear to auscultation bilaterally. No wheezes, rales, or  rhonchi. No distress.  Gastrointestinal:  +BS, soft, non-tender and non-distended. No HSM noted. No guarding or rebound. No masses appreciated.  Rectal:  Deferred  Musculoskalatal:  Symmetrical without gross deformities. Normal posture. Skin:  Intact without significant lesions or rashes. Neurologic:  Alert and oriented x4;  grossly normal neurologically. Psych:  Alert and cooperative. Normal mood and affect. Heme/Lymph/Immune: No significant cervical adenopathy. No excessive bruising noted.    05/11/2017 9:15 AM   Disclaimer: This note was dictated with voice recognition software. Similar sounding words can  inadvertently be transcribed and may not be corrected upon review.

## 2017-05-11 NOTE — Telephone Encounter (Signed)
Patient was a no show and letter sent  °

## 2017-05-11 NOTE — Telephone Encounter (Signed)
Noted  

## 2017-05-12 ENCOUNTER — Other Ambulatory Visit (HOSPITAL_COMMUNITY): Payer: Self-pay | Admitting: Oncology

## 2017-05-19 ENCOUNTER — Encounter (HOSPITAL_BASED_OUTPATIENT_CLINIC_OR_DEPARTMENT_OTHER): Payer: Medicare Other

## 2017-05-19 ENCOUNTER — Other Ambulatory Visit (HOSPITAL_COMMUNITY): Payer: Self-pay | Admitting: Oncology

## 2017-05-19 ENCOUNTER — Ambulatory Visit (HOSPITAL_COMMUNITY): Payer: Medicare Other

## 2017-05-19 ENCOUNTER — Encounter (HOSPITAL_COMMUNITY): Payer: Medicare Other | Attending: Hematology | Admitting: Oncology

## 2017-05-19 ENCOUNTER — Encounter (HOSPITAL_COMMUNITY): Payer: Self-pay | Admitting: Oncology

## 2017-05-19 VITALS — BP 128/76 | HR 81 | Temp 98.7°F | Resp 18 | Wt 216.0 lb

## 2017-05-19 DIAGNOSIS — C3431 Malignant neoplasm of lower lobe, right bronchus or lung: Secondary | ICD-10-CM

## 2017-05-19 DIAGNOSIS — Z5112 Encounter for antineoplastic immunotherapy: Secondary | ICD-10-CM

## 2017-05-19 DIAGNOSIS — D751 Secondary polycythemia: Secondary | ICD-10-CM | POA: Insufficient documentation

## 2017-05-19 DIAGNOSIS — K5900 Constipation, unspecified: Secondary | ICD-10-CM | POA: Diagnosis not present

## 2017-05-19 DIAGNOSIS — C3491 Malignant neoplasm of unspecified part of right bronchus or lung: Secondary | ICD-10-CM

## 2017-05-19 DIAGNOSIS — Z720 Tobacco use: Secondary | ICD-10-CM

## 2017-05-19 LAB — CBC WITH DIFFERENTIAL/PLATELET
Basophils Absolute: 0 10*3/uL (ref 0.0–0.1)
Basophils Relative: 0 %
Eosinophils Absolute: 0.2 10*3/uL (ref 0.0–0.7)
Eosinophils Relative: 2 %
HEMATOCRIT: 46 % (ref 39.0–52.0)
HEMOGLOBIN: 15.6 g/dL (ref 13.0–17.0)
LYMPHS ABS: 1.7 10*3/uL (ref 0.7–4.0)
LYMPHS PCT: 19 %
MCH: 32 pg (ref 26.0–34.0)
MCHC: 33.9 g/dL (ref 30.0–36.0)
MCV: 94.3 fL (ref 78.0–100.0)
MONO ABS: 0.7 10*3/uL (ref 0.1–1.0)
MONOS PCT: 8 %
NEUTROS ABS: 6.3 10*3/uL (ref 1.7–7.7)
Neutrophils Relative %: 71 %
Platelets: 169 10*3/uL (ref 150–400)
RBC: 4.88 MIL/uL (ref 4.22–5.81)
RDW: 15 % (ref 11.5–15.5)
WBC: 8.9 10*3/uL (ref 4.0–10.5)

## 2017-05-19 LAB — COMPREHENSIVE METABOLIC PANEL
ALT: 23 U/L (ref 17–63)
AST: 29 U/L (ref 15–41)
Albumin: 3.6 g/dL (ref 3.5–5.0)
Alkaline Phosphatase: 156 U/L — ABNORMAL HIGH (ref 38–126)
Anion gap: 10 (ref 5–15)
BUN: 10 mg/dL (ref 6–20)
CHLORIDE: 95 mmol/L — AB (ref 101–111)
CO2: 29 mmol/L (ref 22–32)
CREATININE: 0.77 mg/dL (ref 0.61–1.24)
Calcium: 8.7 mg/dL — ABNORMAL LOW (ref 8.9–10.3)
GFR calc non Af Amer: >60 mL/min (ref >60)
GLUCOSE: 78 mg/dL (ref 65–99)
Potassium: 4.1 mmol/L (ref 3.5–5.1)
Sodium: 134 mmol/L — ABNORMAL LOW (ref 135–145)
Total Bilirubin: 0.7 mg/dL (ref 0.3–1.2)
Total Protein: 7.5 g/dL (ref 6.5–8.1)

## 2017-05-19 LAB — TSH: TSH: 1.71 u[IU]/mL (ref 0.350–4.500)

## 2017-05-19 MED ORDER — SODIUM CHLORIDE 0.9 % IV SOLN
Freq: Once | INTRAVENOUS | Status: DC
  Filled 2017-05-19: qty 4

## 2017-05-19 MED ORDER — HEPARIN SOD (PORK) LOCK FLUSH 100 UNIT/ML IV SOLN
500.0000 [IU] | Freq: Once | INTRAVENOUS | Status: AC
  Administered 2017-05-19: 500 [IU] via INTRAVENOUS

## 2017-05-19 MED ORDER — ONDANSETRON 8 MG PO TBDP
ORAL_TABLET | ORAL | Status: AC
  Filled 2017-05-19: qty 1

## 2017-05-19 MED ORDER — HEPARIN SOD (PORK) LOCK FLUSH 100 UNIT/ML IV SOLN
INTRAVENOUS | Status: AC
  Filled 2017-05-19: qty 5

## 2017-05-19 MED ORDER — ONDANSETRON 8 MG PO TBDP
8.0000 mg | ORAL_TABLET | Freq: Once | ORAL | Status: AC
  Administered 2017-05-19: 8 mg via ORAL

## 2017-05-19 MED ORDER — SODIUM CHLORIDE 0.9 % IV SOLN
INTRAVENOUS | Status: DC
  Administered 2017-05-19: 10:00:00 via INTRAVENOUS

## 2017-05-19 MED ORDER — DURVALUMAB 500 MG/10ML IV SOLN
10.2000 mg/kg | Freq: Once | INTRAVENOUS | Status: AC
  Administered 2017-05-19: 1000 mg via INTRAVENOUS
  Filled 2017-05-19: qty 20

## 2017-05-19 NOTE — Patient Instructions (Signed)
[facility] Discharge Instructions for Patients Receiving Chemotherapy   Beginning January 23rd 2017 lab work for the Cancer Center will be done in the  Main lab at [facility] on 1st floor. If you have a lab appointment with the Cancer Center please come in thru the  Main Entrance and check in at the main information desk   Today you received the following chemotherapy agents   To help prevent nausea and vomiting after your treatment, we encourage you to take your nausea medication     If you develop nausea and vomiting, or diarrhea that is not controlled by your medication, call the clinic.  The clinic phone number is [phone]. Office hours are Monday-Friday 8:30am-5:00pm.  BELOW ARE SYMPTOMS THAT SHOULD BE REPORTED IMMEDIATELY:  *FEVER GREATER THAN 101.0 F  *CHILLS WITH OR WITHOUT FEVER  NAUSEA AND VOMITING THAT IS NOT CONTROLLED WITH YOUR NAUSEA MEDICATION  *UNUSUAL SHORTNESS OF BREATH  *UNUSUAL BRUISING OR BLEEDING  TENDERNESS IN MOUTH AND THROAT WITH OR WITHOUT PRESENCE OF ULCERS  *URINARY PROBLEMS  *BOWEL PROBLEMS  UNUSUAL RASH Items with * indicate a potential emergency and should be followed up as soon as possible. If you have an emergency after office hours please contact your primary care physician or go to the nearest emergency department.  Please call the clinic during office hours if you have any questions or concerns.   You may also contact the Patient Navigator at [phone] should you have any questions or need assistance in obtaining follow up care.      Resources For Cancer Patients and their Caregivers ? American Cancer Society: Can assist with transportation, wigs, general needs, runs Look Good Feel Better.        1-888-227-6333 ? Cancer Care: Provides financial assistance, online support groups, medication/co-pay assistance.  1-800-813-HOPE (4673) ? [facility] Assists [place] cancer  patients and their families through emotional , educational and financial support.  336-427-4357 ? [place] DSS Where to apply for food stamps, Medicaid and utility assistance. 336-342-1394 ? [facility]: Transportation to medical appointments. 336-347-2287 ? Social Security Administration: May apply for disability if have a Stage IV cancer. 336-342-7796 1-800-772-1213 ? [place] Aging, Disability and Transit Services: Assists with nutrition, care and transit needs. 336-349-2343         

## 2017-05-19 NOTE — Patient Instructions (Addendum)
[facility] at [facility] Discharge Instructions  RECOMMENDATIONS MADE BY THE CONSULTANT AND ANY TEST RESULTS WILL BE SENT TO YOUR REFERRING PHYSICIAN.  You were seen today by [name] PA-C. CT scan on 6/18. Treatment today. Labs and treatment every 2 weeks. Return in 4 weeks for follow up.    Thank you for choosing [place] at [facility] to provide your oncology and hematology care.  To afford each patient quality time with our provider, please arrive at least 15 minutes before your scheduled appointment time.    If you have a lab appointment with the [facility] please come in thru the  Main Entrance and check in at the main information desk  You need to re-schedule your appointment should you arrive 10 or more minutes late.  We strive to give you quality time with our providers, and arriving late affects you and other patients whose appointments are after yours.  Also, if you no show three or more times for appointments you may be dismissed from the clinic at the providers discretion.     Again, thank you for choosing [facility].  Our hope is that these requests will decrease the amount of time that you wait before being seen by our physicians.       _____________________________________________________________  Should you have questions after your visit to [facility], please contact our office at [phone] between the hours of 8:30 a.m. and 4:30 p.m.  Voicemails left after 4:30 p.m. will not be returned until the following business day.  For prescription refill requests, have your pharmacy contact our office.       Resources For Cancer Patients and their Caregivers ? American Cancer Society: Can assist with transportation, wigs, general needs, runs Look Good Feel Better.        (952)286-2190 ? Cancer Care: Provides financial assistance, online support groups, medication/co-pay assistance.   1-800-813-HOPE 740-635-1112) ? [facility] Assists [place] cancer patients and their families through emotional , educational and financial support.  747 375 3697 ? [place] DSS Where to apply for food stamps, Medicaid and utility assistance. 616-859-1144 ? [facility]: Transportation to medical appointments. 857-111-2055 ? Social Security Administration: May apply for disability if have a Stage IV cancer. 857-195-3399 (786) 277-8563 ? [employer], Disability and Transit Services: Assists with nutrition, care and transit needs. [place] Support Programs: @10RELATIVEDAYS @ > Cancer Support Group  2nd Tuesday of the month 1pm-2pm, Journey Room  > Creative Journey  3rd Tuesday of the month 1130am-1pm, Journey Room  > Look Good Feel Better  1st Wednesday of the month 10am-12 noon, Journey Room (Call [place] to register [phone])

## 2017-05-19 NOTE — Assessment & Plan Note (Addendum)
Stage IIIA (T3N2M0) adenocarcinoma of right lung, diagnosed on RML biopsy by Dr. Roxan Hockey on 07/21/2016.  S/P concomitant chemoXRT with cisplatin/etoposide (08/17/2016- 09/20/2016).  Now on immunotherapy for up to 1 year in the consolidative setting per NCCN guidelines.   Labs today: CBC diff, CMET, TSH.  I personally reviewed and went over laboratory results with the patient.  The results are noted within this dictation.  Labs satisfy treatment parameters today.  Nursing, in accordance with immunotherapy administration protocol, will monitor for acute side effects/toxicities associated with immunotherapy administration today.  Labs in 2 weeks: CBC diff, CMET. Labs in 4 weeks: CBC diff, CMET, TSH  Smoking cessation education is provided today.  He denies any issues related to [place]: Peripheral edema, fatigue, skin rash, hyponatremia, constipation, decreased appetite, nausea, abdominal pain, colitis, diarrhea, tract infection, lymphocytopenia, infection, muscular skeletal pain, dyspnea, dyspnea on exertion, fever.  He is scheduled for restaging CT scan of chest on 05/23/2017.  Previous CT imaging demonstrated findings concerning for underlying cirrhosis.  Continue treatment every 2 weeks.  I will give him a constipation sheet for his constipation issues.  I have recommended over-the-counter triple antibiotic ointment to his toes that were injured with recent fall.  I will also give him a packet of bacitracin in the clinic today.  He is provided education regarding food products that are less irritating to his stomach.  Return in 4 weeks for follow-up, sooner if CT scan is concerning.  If CT scan is stable, and he continues to tolerate immunotherapy well without issues, we can consider decreasing the frequency of follow-up visits (ie: every 6 weeks).

## 2017-05-19 NOTE — Progress Notes (Signed)
Erik Gravel, MD [address] [place] [address]  Adenocarcinoma of right lung [facility])  CURRENT THERAPY: Darvalumab every 2 weeks x 52 weeks beginning on 12/02/2016.  INTERVAL HISTORY: Erik [name] 56 y.o. male returns for followup of Stage IIIA (T3N2M0) adenocarcinoma of right lung, diagnosed on RML biopsy by Dr. Roxan Hockey on 07/21/2016.  S/P concomitant chemoXRT with cisplatin/etoposide (08/17/2016- 09/20/2016).  Now on immunotherapy for up to 52 weeks in the consolidative setting per NCCN guidelines.    Adenocarcinoma of right lung ([place])   07/01/2016 PET scan    6.3 x 3.7 cm right lower lobe mass, suspicious for primary bronchogenic neoplasm, as described above. Hypermetabolic thoracic nodal metastases, as above. Additional right perihilar hypermetabolism, indeterminate. Associated right middle lobe atelectasis/collapse.      07/19/2016 Procedure    Bronchoscopy with brushings and biopsies and endobronchial ultrasound with mediastinal lymph node aspirations by Dr. Roxan Hockey      07/21/2016 Pathology Results    Lung, biopsy, Right Middle Lobe - LUNG TISSUE WITH SQUAMOUS METAPLASIA. - NO MALIGNANCY IDENTIFIED.      07/21/2016 Pathology Results    FINE NEEDLE ASPIRATION, ENDOSCOPIC (A) LEVEL 7 (SPECIMEN 1 OF 3, COLLECTED ON 07/19/16): MALIGNANT CELLS CONSISTENT WITH ADENOCARCINOMA.      07/21/2016 Pathology Results    FINE NEEDLE ASPIRATION, EBUS, 4R, B (SPECIMEN 2 OF 3, COLLECTED 07/19/16): MALIGNANT CELLS CONSISTENT WITH ADENOCARCINOMA.      07/21/2016 Pathology Results    FINE NEEDLE ASPIRATION, EBUS, BRUSHING, RIGHT MIDDLE LOBE, D (SPECIMEN 3 OF 3, COLLECTED 07/19/16): MALIGNANT CELLS CONSISTENT WITH ADENOCARCINOMA.      07/22/2016 Imaging    MRI brain- No evidence of intracranial metastases.      08/17/2016 - 09/20/2016 Chemotherapy    The patient had palonosetron (ALOXI) injection 0.25 mg, 0.25 mg, Intravenous,  Once, 1 of 1  cycle  CISplatin (PLATINOL) 123 mg in sodium chloride 0.9 % 500 mL chemo infusion, 50 mg/m2 = 123 mg, Intravenous,  Once, 1 of 1 cycle  etoposide (VEPESID) 120 mg in sodium chloride 0.9 % 500 mL chemo infusion, 50 mg/m2 = 120 mg, Intravenous,  Once, 1 of 1 cycle  fosaprepitant (EMEND) 150 mg, dexamethasone (DECADRON) 12 mg in sodium chloride 0.9 % 145 mL IVPB, , Intravenous,  Once, 1 of 1 cycle  ondansetron (ZOFRAN) 8 mg in sodium chloride 0.9 % 50 mL IVPB, , Intravenous,  Once, 2 of 6 cycles  for chemotherapy treatment.        08/17/2016 -  Radiation Therapy         11/15/2016 Imaging    CT CAP- Interval response to therapy. The previously demonstrated mediastinal and right hilar adenopathy and resulting right middle lobe atelectasis have all improved. 2. No discrete residual lung masses are identified. There is new multifocal ground-glass opacity within the right lower lobe, and to a lesser extent in the left upper lobe. These are probably inflammatory/treatment related. 3. No evidence of abdominopelvic metastatic disease. Stable prominent lymph nodes in the upper abdomen, likely reactive. 4. Decompressed mid SVC without specific signs of SVC occlusion.      12/02/2016 -  Chemotherapy    Imfinzi (durvalumab) immunotherapy every 2 weeks x up to 1 year      01/03/2017 Procedure    EGD by Dr. [name], colonoscopy aborted as "patient forgot to take other half of preparation). Esophagitis. likely radiation-induced ?Dilated.  Erythematous mucosa in the stomach. Biopsied. Normal duodenal bulb and  second portion of the duodenum.      02/14/2017 Imaging    CT chest- 1. Development of a small to moderate right-sided pleural effusion with minimal loculation anteriorly. 2. Worsened right-sided aeration with right middle and lower lobe consolidation. As this has a geographic distribution, this could be radiation induced or represent infection. Depending on clinical concern of progressive  disease, thoracentesis and/or PET may be informative. 3. Development of mild right paratracheal adenopathy, most likely reactive. Recommend attention on follow-up. 4. Subtle findings which are highly suspicious for mild cirrhosis. Upper abdominal adenopathy is similar and likely reactive. 5. Persistent right lower and improved left upper lobe ground-glass opacities are favored to be infectious or inflammatory.        HPI Elements   Location: Right lung  Quality: Adenocarcinoma  Severity: Stage IIIA  Duration: Dx on 07/21/2016  Context: S/P chemoXRT with cisplatin/etoposide (08/17/2016- 09/20/2016)  Timing:   Modifying Factors:   Associated Signs & Symptoms:    He is tolerating treatment well.  He denies any diarrhea.  He does report some constipation.  He denies any chest pain, shortness of breath, or abnormal coughing.  He reports a recent fall resulting in a rug burn like rash on his left #3, 4, 5 toes.  He notes that these are  painful.  No significant erythema or discharge.  No signs of infection.  He reports ongoing issues.  He is seen drinking a Coke today.  He is provided patient information regarding foods and other food products that can be irritating to the stomach.  Review of Systems  Constitutional: Negative.  Negative for chills, fever and weight loss.  HENT: Negative.   Eyes: Negative.   Respiratory: Negative.  Negative for cough.   Cardiovascular: Negative.  Negative for chest pain.  Gastrointestinal: Positive for constipation. Negative for blood in stool, diarrhea, melena, nausea and vomiting.  Genitourinary: Negative.   Musculoskeletal: Negative.   Skin: Negative.   Neurological: Positive for sensory change. Negative for weakness.  Endo/Heme/Allergies: Negative.   Psychiatric/Behavioral: Negative.     Past Medical History:  Diagnosis Date  . Arnold-Chiari syndrome ([place])   . Arthritis   . Asthma   . Chronic back pain   . Depression   . GERD  (gastroesophageal reflux disease)   . Hypertension   . Seasonal allergies   . Spinal stenosis of lumbar region   . Squamous cell carcinoma of right lung ([place]) 07/23/2016  . Wheezing     Past Surgical History:  Procedure Laterality Date  . BACK SURGERY     5 total  . BIOPSY  01/03/2017   Procedure: BIOPSY;  Surgeon: Daneil Dolin, MD;  [facility]: [facility];  Service: Endoscopy;;  gastric  . COLONOSCOPY WITH PROPOFOL N/A 04/04/2017   Procedure: COLONOSCOPY WITH PROPOFOL;  Surgeon: Daneil Dolin, MD;  [facility]: [facility];  Service: Endoscopy;  Laterality: N/A;  8:45am  . ESOPHAGOGASTRODUODENOSCOPY (EGD) WITH PROPOFOL N/A 01/03/2017   Procedure: ESOPHAGOGASTRODUODENOSCOPY (EGD) WITH PROPOFOL;  Surgeon: Daneil Dolin, MD;  [facility]: [facility];  Service: Endoscopy;  Laterality: N/A;  . Venia Minks DILATION N/A 01/03/2017   Procedure: Venia Minks DILATION;  Surgeon: Daneil Dolin, MD;  [facility];  Service: Endoscopy;  Laterality: N/A;  . MULTIPLE EXTRACTIONS WITH ALVEOLOPLASTY N/A 07/08/2014   Procedure: MULTIPLE EXTRACION WITH ALVEOLOPLASTY with EXCISION LESION RIGHT SIDE OF TONGUE;  Surgeon: Gae Bon, DDS;  Location: [facility];  Service: Oral Surgery;  Laterality: N/A;  .  PORTACATH PLACEMENT Right 07/30/2016   Procedure: INSERTION PORT-A-CATH;  Surgeon: Vickie Epley, MD;  Location: [facility];  Service: Vascular;  Laterality: Right;  [name] VIDEO BRONCHOSCOPY WITH ENDOBRONCHIAL ULTRASOUND N/A 07/19/2016   Procedure: VIDEO BRONCHOSCOPY WITH ENDOBRONCHIAL ULTRASOUND;  Surgeon: [name], MD;  Location: [facility] OR;  Service: Thoracic;  Laterality: N/A;    Family History  Problem Relation Age of Onset  . Cancer Mother        breast cancer  . Heart failure Brother   . Colon cancer Neg Hx        not sure, ?grandfather and/or uncle    Social History   Social History  . Marital status: Divorced    Spouse name: N/A  . Number of children: N/A  . Years of education: N/A    Social History Main Topics  . Smoking status: Current Every Day Smoker    Packs/day: 0.25    Years: 38.00    Types: Cigarettes  . Smokeless tobacco: Former Systems developer    Types: Snuff     Comment: using chantix- smoking once in a while  . Alcohol use 1.2 oz/week    2 Cans of beer per week     Comment: reports drinking 0-2 beers a week  . Drug use: Yes    Frequency: 7.0 times per week    Types: Marijuana     Comment: daily for cancer  . Sexual activity: Not Currently   Other Topics Concern  . None   Social History Narrative  . None     PHYSICAL EXAMINATION  ECOG PERFORMANCE STATUS: 1 - Symptomatic but completely ambulatory  There were no vitals filed for this visit.  Vitals - 1 value per visit 1/96/2229  SYSTOLIC 798  DIASTOLIC 70  Pulse 89  Temperature 98.2  Respirations 16  Weight (lb) 216    GENERAL:alert, no distress, well nourished, well developed, comfortable, cooperative, obese, smiling and unaccompanied, in chemo-recliner. SKIN: skin color, texture, turgor are normal, no rashes or significant lesions HEAD: Normocephalic, No masses, lesions, tenderness or abnormalities EYES: normal, EOMI, Conjunctiva are pink and non-injected EARS: External ears normal OROPHARYNX:lips, buccal mucosa, and tongue normal and mucous membranes are moist  NECK: supple, trachea midline LYMPH:  no palpable lymphadenopathy BREAST:not examined LUNGS: clear to auscultation  HEART: regular rate & rhythm, no murmurs and no gallops ABDOMEN:abdomen soft, obese and normal bowel sounds BACK: Back symmetric, no curvature. EXTREMITIES:less then 2 second capillary refill, no joint deformities, effusion, or inflammation, no skin discoloration, no cyanosis  NEURO: alert & oriented x 3 with fluent speech, no focal motor/sensory deficits, gait normal   LABORATORY DATA: CBC    Component Value Date/Time   WBC 8.9 05/19/2017 0900   RBC 4.88 05/19/2017 0900   HGB 15.6 05/19/2017 0900   HCT 46.0  05/19/2017 0900   PLT 169 05/19/2017 0900   MCV 94.3 05/19/2017 0900   MCH 32.0 05/19/2017 0900   MCHC 33.9 05/19/2017 0900   RDW 15.0 05/19/2017 0900   LYMPHSABS 1.7 05/19/2017 0900   MONOABS 0.7 05/19/2017 0900   EOSABS 0.2 05/19/2017 0900   BASOSABS 0.0 05/19/2017 0900      Chemistry      Component Value Date/Time   NA 134 (L) 05/19/2017 0900   K 4.1 05/19/2017 0900   CL 95 (L) 05/19/2017 0900   CO2 29 05/19/2017 0900   BUN 10 05/19/2017 0900   CREATININE 0.77 05/19/2017 0900      Component Value Date/Time  CALCIUM 8.7 (L) 05/19/2017 0900   ALKPHOS 156 (H) 05/19/2017 0900   AST 29 05/19/2017 0900   ALT 23 05/19/2017 0900   BILITOT 0.7 05/19/2017 0900      Lab Results  Component Value Date   TSH 1.185 04/21/2017     PENDING LABS:   RADIOGRAPHIC STUDIES:  No results found.   PATHOLOGY:    ASSESSMENT AND PLAN:  Adenocarcinoma of right lung ([facility]) Stage IIIA (T3N2M0) adenocarcinoma of right lung, diagnosed on RML biopsy by Dr. Roxan Hockey on 07/21/2016.  S/P concomitant chemoXRT with cisplatin/etoposide (08/17/2016- 09/20/2016).  Now on immunotherapy for up to 1 year in the consolidative setting per NCCN guidelines.   Labs today: CBC diff, CMET, TSH.  I personally reviewed and went over laboratory results with the patient.  The results are noted within this dictation.  Labs satisfy treatment parameters today.  Nursing, in accordance with immunotherapy administration protocol, will monitor for acute side effects/toxicities associated with immunotherapy administration today.  Labs in 2 weeks: CBC diff, CMET. Labs in 4 weeks: CBC diff, CMET, TSH  Smoking cessation education is provided today.  He denies any issues related to Imfinzi: Peripheral edema, fatigue, skin rash, hyponatremia, constipation, decreased appetite, nausea, abdominal pain, colitis, diarrhea, tract infection, lymphocytopenia, infection, muscular skeletal pain, dyspnea, dyspnea on exertion,  fever.  He is scheduled for restaging CT scan of chest on 05/23/2017.  Previous CT imaging demonstrated findings concerning for underlying cirrhosis.  Continue treatment every 2 weeks.  I will give him a constipation sheet for his constipation issues.  I have recommended over-the-counter triple antibiotic ointment to his toes that were injured with recent fall.  I will also give him a packet of bacitracin in the clinic today.  He is provided education regarding food products that are less irritating to his stomach.  Return in 4 weeks for follow-up, sooner if CT scan is concerning.  If CT scan is stable, and he continues to tolerate immunotherapy well without issues, we can consider decreasing the frequency of follow-up visits (ie: every 6 weeks).   ORDERS PLACED FOR THIS ENCOUNTER: No orders of the defined types were placed in this encounter.   MEDICATIONS PRESCRIBED THIS ENCOUNTER: No orders of the defined types were placed in this encounter.   THERAPY PLAN:  Continue with consolidative immunotherapy.  All questions were answered. The patient knows to call the clinic with any problems, questions or concerns. We can certainly see the patient much sooner if necessary.  Patient and plan discussed with Dr. Twana First and she is in agreement with the aforementioned.   This note is electronically signed by: Doy Mince 05/19/2017 10:10 AM

## 2017-05-19 NOTE — Progress Notes (Signed)
Patient presented today for chemotherapy. Patient states that he fell last evening, complaints of his feet stinging and burning, swelling. Patients has some scraps on his left 2 two, knees, bruising on his left arm at elbow.   Chemotherapy given today per orders. Patient tolerated it well without problems. Vitals stable and discharged home from clinic ambulatory. Follow up as scheduled.

## 2017-05-23 ENCOUNTER — Ambulatory Visit (HOSPITAL_COMMUNITY): Payer: Medicare Other

## 2017-06-01 ENCOUNTER — Ambulatory Visit (HOSPITAL_COMMUNITY)
Admission: RE | Admit: 2017-06-01 | Discharge: 2017-06-01 | Disposition: A | Discharge status: Home / Self Care | Payer: Medicare Other | Source: Ambulatory Visit | Attending: Adult Health | Admitting: Adult Health

## 2017-06-01 DIAGNOSIS — J90 Pleural effusion, not elsewhere classified: Secondary | ICD-10-CM | POA: Diagnosis not present

## 2017-06-01 DIAGNOSIS — C3491 Malignant neoplasm of unspecified part of right bronchus or lung: Secondary | ICD-10-CM

## 2017-06-01 MED ORDER — IOPAMIDOL (ISOVUE-300) INJECTION 61%
75.0000 mL | Freq: Once | INTRAVENOUS | Status: AC | PRN
  Administered 2017-06-01: 75 mL via INTRAVENOUS

## 2017-06-02 ENCOUNTER — Encounter (HOSPITAL_BASED_OUTPATIENT_CLINIC_OR_DEPARTMENT_OTHER): Payer: Medicare Other

## 2017-06-02 ENCOUNTER — Ambulatory Visit (HOSPITAL_COMMUNITY): Payer: Medicare Other

## 2017-06-02 ENCOUNTER — Encounter (HOSPITAL_BASED_OUTPATIENT_CLINIC_OR_DEPARTMENT_OTHER): Payer: Medicare Other | Admitting: Oncology

## 2017-06-02 ENCOUNTER — Encounter (HOSPITAL_COMMUNITY): Payer: Self-pay

## 2017-06-02 VITALS — BP 123/77 | HR 88 | Temp 98.9°F | Resp 18 | Wt 210.8 lb

## 2017-06-02 DIAGNOSIS — C3491 Malignant neoplasm of unspecified part of right bronchus or lung: Secondary | ICD-10-CM | POA: Diagnosis present

## 2017-06-02 DIAGNOSIS — Z5112 Encounter for antineoplastic immunotherapy: Secondary | ICD-10-CM | POA: Diagnosis not present

## 2017-06-02 DIAGNOSIS — D751 Secondary polycythemia: Secondary | ICD-10-CM | POA: Diagnosis not present

## 2017-06-02 LAB — CBC WITH DIFFERENTIAL/PLATELET
Basophils Absolute: 0 10*3/uL (ref 0.0–0.1)
Basophils Relative: 0 %
EOS ABS: 0.1 10*3/uL (ref 0.0–0.7)
EOS PCT: 1 %
HCT: 44.4 % (ref 39.0–52.0)
Hemoglobin: 15.3 g/dL (ref 13.0–17.0)
LYMPHS ABS: 2.1 10*3/uL (ref 0.7–4.0)
Lymphocytes Relative: 25 %
MCH: 32.3 pg (ref 26.0–34.0)
MCHC: 34.5 g/dL (ref 30.0–36.0)
MCV: 93.7 fL (ref 78.0–100.0)
MONO ABS: 0.6 10*3/uL (ref 0.1–1.0)
MONOS PCT: 8 %
Neutro Abs: 5.6 10*3/uL (ref 1.7–7.7)
Neutrophils Relative %: 66 %
PLATELETS: 144 10*3/uL — AB (ref 150–400)
RBC: 4.74 MIL/uL (ref 4.22–5.81)
RDW: 15.1 % (ref 11.5–15.5)
WBC: 8.3 10*3/uL (ref 4.0–10.5)

## 2017-06-02 LAB — COMPREHENSIVE METABOLIC PANEL
ALK PHOS: 140 U/L — AB (ref 38–126)
ALT: 18 U/L (ref 17–63)
ANION GAP: 7 (ref 5–15)
AST: 20 U/L (ref 15–41)
Albumin: 3.5 g/dL (ref 3.5–5.0)
BUN: 9 mg/dL (ref 6–20)
CALCIUM: 8.9 mg/dL (ref 8.9–10.3)
CHLORIDE: 101 mmol/L (ref 101–111)
CO2: 28 mmol/L (ref 22–32)
Creatinine, Ser: 0.81 mg/dL (ref 0.61–1.24)
GFR calc non Af Amer: >60 mL/min (ref >60)
Glucose, Bld: 111 mg/dL — ABNORMAL HIGH (ref 65–99)
POTASSIUM: 3.5 mmol/L (ref 3.5–5.1)
Sodium: 136 mmol/L (ref 135–145)
Total Bilirubin: 0.8 mg/dL (ref 0.3–1.2)
Total Protein: 7.4 g/dL (ref 6.5–8.1)

## 2017-06-02 MED ORDER — ONDANSETRON 8 MG PO TBDP
8.0000 mg | ORAL_TABLET | Freq: Once | ORAL | Status: AC
  Administered 2017-06-02: 8 mg via ORAL
  Filled 2017-06-02: qty 1

## 2017-06-02 MED ORDER — HEPARIN SOD (PORK) LOCK FLUSH 100 UNIT/ML IV SOLN
500.0000 [IU] | Freq: Once | INTRAVENOUS | Status: AC
  Administered 2017-06-02: 500 [IU] via INTRAVENOUS

## 2017-06-02 MED ORDER — SODIUM CHLORIDE 0.9% FLUSH
10.0000 mL | INTRAVENOUS | Status: DC | PRN
  Administered 2017-06-02: 10 mL via INTRAVENOUS
  Filled 2017-06-02: qty 10

## 2017-06-02 MED ORDER — SODIUM CHLORIDE 0.9 % IV SOLN
INTRAVENOUS | Status: DC
  Administered 2017-06-02: 09:00:00 via INTRAVENOUS

## 2017-06-02 MED ORDER — SODIUM CHLORIDE 0.9 % IV SOLN
10.2000 mg/kg | Freq: Once | INTRAVENOUS | Status: AC
  Administered 2017-06-02: 1000 mg via INTRAVENOUS
  Filled 2017-06-02: qty 20

## 2017-06-02 MED ORDER — HEPARIN SOD (PORK) LOCK FLUSH 100 UNIT/ML IV SOLN
INTRAVENOUS | Status: AC
  Filled 2017-06-02: qty 5

## 2017-06-02 NOTE — Progress Notes (Signed)
Erik [name] tolerated Imfinzi infusion well without complaints or incident. Labs reviewed with Dr. Talbert Cage prior to administering this medication. VSS upon discharge. Pt discharged self ambulatory in satisfactory condition

## 2017-06-02 NOTE — Patient Instructions (Signed)
[facility] Discharge Instructions for Patients Receiving Chemotherapy   Beginning January 23rd 2017 lab work for the [facility] will be done in the  Main lab at [facility] on 1st floor. If you have a lab appointment with the [facility] please come in thru the  Main Entrance and check in at the main information desk   Today you received the following chemotherapy agents Imfinzi. Follow-up as scheduled. Call clinic for any questions or concerns  To help prevent nausea and vomiting after your treatment, we encourage you to take your nausea medication   If you develop nausea and vomiting, or diarrhea that is not controlled by your medication, call the clinic.  The clinic phone number is [phone]. Office hours are Monday-Friday 8:30am-5:00pm.  BELOW ARE SYMPTOMS THAT SHOULD BE REPORTED IMMEDIATELY:  *FEVER GREATER THAN 101.0 F  *CHILLS WITH OR WITHOUT FEVER  NAUSEA AND VOMITING THAT IS NOT CONTROLLED WITH YOUR NAUSEA MEDICATION  *UNUSUAL SHORTNESS OF BREATH  *UNUSUAL BRUISING OR BLEEDING  TENDERNESS IN MOUTH AND THROAT WITH OR WITHOUT PRESENCE OF ULCERS  *URINARY PROBLEMS  *BOWEL PROBLEMS  UNUSUAL RASH Items with * indicate a potential emergency and should be followed up as soon as possible. If you have an emergency after office hours please contact your primary care physician or go to the nearest emergency department.  Please call the clinic during office hours if you have any questions or concerns.   You may also contact the Patient Navigator at [phone] should you have any questions or need assistance in obtaining follow up care.      Resources For Cancer Patients and their Caregivers ? American Cancer Society: Can assist with transportation, wigs, general needs, runs Look Good Feel Better.        769 235 8261 ? Cancer Care: Provides financial assistance, online support groups, medication/co-pay assistance.  1-800-813-HOPE  305-562-7817) ? [facility] Assists [place] cancer patients and their families through emotional , educational and financial support.  6146714500 ? [place] DSS Where to apply for food stamps, Medicaid and utility assistance. 651-561-3409 ? [facility]: Transportation to medical appointments. (647) 596-9177 ? Social Security Administration: May apply for disability if have a Stage IV cancer. 408-012-7571 231-377-2845 ? [employer], Disability and Transit Services: Assists with nutrition, care and transit needs. 587-873-2637

## 2017-06-02 NOTE — Progress Notes (Signed)
[name], MD [address] [place] [address]  No diagnosis found.  CURRENT THERAPY: Darvalumab every 2 weeks x 52 weeks beginning on 12/02/2016.  INTERVAL HISTORY: Erik [name] 56 y.o. male returns for followup of Stage IIIA (T3N2M0) adenocarcinoma of right lung, diagnosed on RML biopsy by Dr. Roxan Hockey on 07/21/2016.  S/P concomitant chemoXRT with cisplatin/etoposide (08/17/2016- 09/20/2016).  Now on immunotherapy for up to 52 weeks in the consolidative setting per NCCN guidelines.    Adenocarcinoma of right lung ([place])   07/01/2016 PET scan    6.3 x 3.7 cm right lower lobe mass, suspicious for primary bronchogenic neoplasm, as described above. Hypermetabolic thoracic nodal metastases, as above. Additional right perihilar hypermetabolism, indeterminate. Associated right middle lobe atelectasis/collapse.      07/19/2016 Procedure    Bronchoscopy with brushings and biopsies and endobronchial ultrasound with mediastinal lymph node aspirations by Dr. Roxan Hockey      07/21/2016 Pathology Results    Lung, biopsy, Right Middle Lobe - LUNG TISSUE WITH SQUAMOUS METAPLASIA. - NO MALIGNANCY IDENTIFIED.      07/21/2016 Pathology Results    FINE NEEDLE ASPIRATION, ENDOSCOPIC (A) LEVEL 7 (SPECIMEN 1 OF 3, COLLECTED ON 07/19/16): MALIGNANT CELLS CONSISTENT WITH ADENOCARCINOMA.      07/21/2016 Pathology Results    FINE NEEDLE ASPIRATION, EBUS, 4R, B (SPECIMEN 2 OF 3, COLLECTED 07/19/16): MALIGNANT CELLS CONSISTENT WITH ADENOCARCINOMA.      07/21/2016 Pathology Results    FINE NEEDLE ASPIRATION, EBUS, BRUSHING, RIGHT MIDDLE LOBE, D (SPECIMEN 3 OF 3, COLLECTED 07/19/16): MALIGNANT CELLS CONSISTENT WITH ADENOCARCINOMA.      07/22/2016 Imaging    MRI brain- No evidence of intracranial metastases.      08/17/2016 - 09/20/2016 Chemotherapy    The patient had palonosetron (ALOXI) injection 0.25 mg, 0.25 mg, Intravenous,  Once, 1 of 1 cycle  CISplatin (PLATINOL)  123 mg in sodium chloride 0.9 % 500 mL chemo infusion, 50 mg/m2 = 123 mg, Intravenous,  Once, 1 of 1 cycle  etoposide (VEPESID) 120 mg in sodium chloride 0.9 % 500 mL chemo infusion, 50 mg/m2 = 120 mg, Intravenous,  Once, 1 of 1 cycle  fosaprepitant (EMEND) 150 mg, dexamethasone (DECADRON) 12 mg in sodium chloride 0.9 % 145 mL IVPB, , Intravenous,  Once, 1 of 1 cycle  ondansetron (ZOFRAN) 8 mg in sodium chloride 0.9 % 50 mL IVPB, , Intravenous,  Once, 2 of 6 cycles  for chemotherapy treatment.        08/17/2016 -  Radiation Therapy         11/15/2016 Imaging    CT CAP- Interval response to therapy. The previously demonstrated mediastinal and right hilar adenopathy and resulting right middle lobe atelectasis have all improved. 2. No discrete residual lung masses are identified. There is new multifocal ground-glass opacity within the right lower lobe, and to a lesser extent in the left upper lobe. These are probably inflammatory/treatment related. 3. No evidence of abdominopelvic metastatic disease. Stable prominent lymph nodes in the upper abdomen, likely reactive. 4. Decompressed mid SVC without specific signs of SVC occlusion.      12/02/2016 -  Chemotherapy    Imfinzi (durvalumab) immunotherapy every 2 weeks x up to 1 year      01/03/2017 Procedure    EGD by Dr. [name], colonoscopy aborted as "patient forgot to take other half of preparation). Esophagitis. likely radiation-induced ?Dilated.  Erythematous mucosa in the stomach. Biopsied. Normal duodenal bulb and second portion  of the duodenum.      02/14/2017 Imaging    CT chest- 1. Development of a small to moderate right-sided pleural effusion with minimal loculation anteriorly. 2. Worsened right-sided aeration with right middle and lower lobe consolidation. As this has a geographic distribution, this could be radiation induced or represent infection. Depending on clinical concern of progressive disease, thoracentesis and/or  PET may be informative. 3. Development of mild right paratracheal adenopathy, most likely reactive. Recommend attention on follow-up. 4. Subtle findings which are highly suspicious for mild cirrhosis. Upper abdominal adenopathy is similar and likely reactive. 5. Persistent right lower and improved left upper lobe ground-glass opacities are favored to be infectious or inflammatory.      06/01/2017 Imaging    CT chest  IMPRESSION: 1. Evolving radiation changes in the medial right hemithorax. 2. Stable moderate size right pleural effusion without definite nodular components. 3. No signs of local recurrence or progressive metastatic disease. No residual enlarged thoracic lymph nodes. 4. Stable appearance of the visualized upper abdomen with probable cirrhosis and reactive adenopathy in the porta hepatis.        Patient presents today for continued follow up. He is tolerating Imfinzi well without any side effects. He had a stye develop in his eye a few days ago for which he is taking antibiotics. Otherwise he is doing well and has no complaints. He continues to smoke cigarettes at this time, about 10 cigarettes a day.   Review of Systems  Constitutional: Negative.  Negative for chills, fever and weight loss.  HENT: Negative.        Stye in his right eye  Eyes: Negative.   Respiratory: Negative.  Negative for cough.   Cardiovascular: Negative.  Negative for chest pain.  Gastrointestinal: Negative for blood in stool, constipation, diarrhea, melena, nausea and vomiting.  Genitourinary: Negative.   Musculoskeletal: Negative.   Skin: Negative.   Neurological: Negative for sensory change and weakness.  Endo/Heme/Allergies: Negative.   Psychiatric/Behavioral: Negative.     Past Medical History:  Diagnosis Date  . Arnold-Chiari syndrome ([place])   . Arthritis   . Asthma   . Chronic back pain   . Depression   . GERD (gastroesophageal reflux disease)   . Hypertension   . Seasonal  allergies   . Spinal stenosis of lumbar region   . Squamous cell carcinoma of right lung ([place]) 07/23/2016  . Wheezing     Past Surgical History:  Procedure Laterality Date  . BACK SURGERY     5 total  . BIOPSY  01/03/2017   Procedure: BIOPSY;  Surgeon: Daneil Dolin, MD;  Location: [facility];  Service: Endoscopy;;  gastric  . COLONOSCOPY WITH PROPOFOL N/A 04/04/2017   Procedure: COLONOSCOPY WITH PROPOFOL;  Surgeon: Daneil Dolin, MD;  Location: [facility];  Service: Endoscopy;  Laterality: N/A;  8:45am  . ESOPHAGOGASTRODUODENOSCOPY (EGD) WITH PROPOFOL N/A 01/03/2017   Procedure: ESOPHAGOGASTRODUODENOSCOPY (EGD) WITH PROPOFOL;  Surgeon: Daneil Dolin, MD;  Location: [facility];  Service: Endoscopy;  Laterality: N/A;  . Venia Minks DILATION N/A 01/03/2017   Procedure: Venia Minks DILATION;  Surgeon: Daneil Dolin, MD;  Location: [facility];  Service: Endoscopy;  Laterality: N/A;  . MULTIPLE EXTRACTIONS WITH ALVEOLOPLASTY N/A 07/08/2014   Procedure: MULTIPLE EXTRACION WITH ALVEOLOPLASTY with EXCISION LESION RIGHT SIDE OF TONGUE;  Surgeon: Gae Bon, DDS;  Location: [place];  Service: Oral Surgery;  Laterality: N/A;  . PORTACATH PLACEMENT Right 07/30/2016   Procedure: INSERTION PORT-A-CATH;  Surgeon: Vickie Epley, MD;  Location: [facility];  Service: Vascular;  Laterality: Right;  [name] VIDEO BRONCHOSCOPY WITH ENDOBRONCHIAL ULTRASOUND N/A 07/19/2016   Procedure: VIDEO BRONCHOSCOPY WITH ENDOBRONCHIAL ULTRASOUND;  Surgeon: [name], MD;  Location: [facility];  Service: Thoracic;  Laterality: N/A;    Family History  Problem Relation Age of Onset  . Cancer Mother        breast cancer  . Heart failure Brother   . Colon cancer Neg Hx        not sure, ?grandfather and/or uncle    Social History   Social History  . Marital status: Divorced    Spouse name: N/A  . Number of children: N/A  . Years of education: N/A   Social History Main Topics  . Smoking status: Current Every Day  Smoker    Packs/day: 0.25    Years: 38.00    Types: Cigarettes  . Smokeless tobacco: Former Systems developer    Types: Snuff     Comment: using chantix- smoking once in a while  . Alcohol use 1.2 oz/week    2 Cans of beer per week     Comment: reports drinking 0-2 beers a week  . Drug use: Yes    Frequency: 7.0 times per week    Types: Marijuana     Comment: daily for cancer  . Sexual activity: Not Currently   Other Topics Concern  . Not on file   Social History Narrative  . No narrative on file     PHYSICAL EXAMINATION  ECOG PERFORMANCE STATUS: 1 - Symptomatic but completely ambulatory  There were no vitals filed for this visit.  Vitals - 1 value per visit 01/07/5426  SYSTOLIC 062  DIASTOLIC 70  Pulse 89  Temperature 98.2  Respirations 16  Weight (lb) 216    Physical Exam  Constitutional: He is oriented to person, place, and time. He appears well-developed and well-nourished. No distress.  HENT:  Head: Normocephalic and atraumatic.  Mouth/Throat: No oropharyngeal exudate.  Eyes: No scleral icterus.  Mild right eye erythema  Neck: Normal range of motion. Neck supple.  Cardiovascular: Normal rate, regular rhythm and normal heart sounds.  Exam reveals no gallop and no friction rub.   No murmur heard. Pulmonary/Chest: Effort normal and breath sounds normal.  Bilateral rhonchi  Abdominal: Soft. Bowel sounds are normal. He exhibits no distension. There is no tenderness.  Musculoskeletal: Normal range of motion. He exhibits no edema or tenderness.  Lymphadenopathy:    He has no cervical adenopathy.  Neurological: He is alert and oriented to person, place, and time. No cranial nerve deficit. Coordination normal.  Skin: Skin is warm and dry. No rash noted. He is not diaphoretic. No erythema. No pallor.  Psychiatric: He has a normal mood and affect. His behavior is normal. Judgment and thought content normal.    LABORATORY DATA: CBC    Component Value Date/Time   WBC 8.3  06/02/2017 0839   RBC 4.74 06/02/2017 0839   HGB 15.3 06/02/2017 0839   HCT 44.4 06/02/2017 0839   PLT 144 (L) 06/02/2017 0839   MCV 93.7 06/02/2017 0839   MCH 32.3 06/02/2017 0839   MCHC 34.5 06/02/2017 0839   RDW 15.1 06/02/2017 0839   LYMPHSABS 2.1 06/02/2017 0839   MONOABS 0.6 06/02/2017 0839   EOSABS 0.1 06/02/2017 0839   BASOSABS 0.0 06/02/2017 0839      Chemistry      Component Value Date/Time   NA 136 06/02/2017  0839   K 3.5 06/02/2017 0839   CL 101 06/02/2017 0839   CO2 28 06/02/2017 0839   BUN 9 06/02/2017 0839   CREATININE 0.81 06/02/2017 0839      Component Value Date/Time   CALCIUM 8.9 06/02/2017 0839   ALKPHOS 140 (H) 06/02/2017 0839   AST 20 06/02/2017 0839   ALT 18 06/02/2017 0839   BILITOT 0.8 06/02/2017 0839      Lab Results  Component Value Date   TSH 1.710 05/19/2017     PENDING LABS:   RADIOGRAPHIC STUDIES:  Ct Chest W Contrast  Result Date: 06/01/2017 CLINICAL DATA:  Stage III non-small cell lung cancer diagnosed in July 2017. Chemotherapy and radiation therapy completed. Immunotherapy ongoing. EXAM: CT CHEST WITH CONTRAST TECHNIQUE: Multidetector CT imaging of the chest was performed during intravenous contrast administration. CONTRAST:  31mL ISOVUE-300 IOPAMIDOL (ISOVUE-300) INJECTION 61% COMPARISON:  CT 02/14/2017 and 11/15/2016. FINDINGS: Cardiovascular: Right IJ Port-A-Cath tip extends to the superior right atrium, stable. Stable mild aortic atherosclerosis. No acute vascular findings. The heart size is stable. There is a stable small pericardial effusion. Mediastinum/Nodes: Previously noted high right paratracheal node appears smaller, measuring 7 mm on image 27 (previously 11 mm). No other discrete enlarged mediastinal, hilar or axillary lymph nodes. There is confluent soft tissue around the right hilum attributed to previous radiation therapy. The thyroid gland and trachea appear unremarkable. There is possible mild diffuse esophageal wall  thickening. Lungs/Pleura: Moderate size, predominantly dependent right pleural effusion appears partially loculated anterolaterally. No definite pleural nodularity. There are evolving paramediastinal radiation changes in the right lung, and to a lesser degree medially in the left upper lobe. No worsening airspace disease or developing pulmonary nodules are identified. There is stable mild subpleural reticulation in both lungs. Upper abdomen: The visualized upper abdomen appears stable. There is mild contour irregularity of the liver with relative enlargement of the caudate and lateral lobes. There are prominent lymph nodes in the porta hepatis as before, likely reactive. No adrenal mass. Musculoskeletal/Chest wall: There is no chest wall mass or suspicious osseous finding. IMPRESSION: 1. Evolving radiation changes in the medial right hemithorax. 2. Stable moderate size right pleural effusion without definite nodular components. 3. No signs of local recurrence or progressive metastatic disease. No residual enlarged thoracic lymph nodes. 4. Stable appearance of the visualized upper abdomen with probable cirrhosis and reactive adenopathy in the porta hepatis. Electronically Signed   By: Richardean Sale M.D.   On: 06/01/2017 16:50     PATHOLOGY:    ASSESSMENT:  Stage IIIA (T3N2M0) adenocarcinoma of right lung, diagnosed on RML biopsy by Dr. Roxan Hockey on 07/21/2016.  S/P concomitant chemoXRT with cisplatin/etoposide (08/17/2016- 09/20/2016).  Now on immunotherapy for up to 1 year in the consolidative setting per NCCN guidelines.   PLAN: Reviewed his restaging CT chest from 06/01/17 in detail with the patient. He has some post radiation lung changes, but otherwise no evidence of disease. Continue imfinzi q2 weeks as scheduled. Tolerating it very well. RTC in 4 weeks for follow up.   THERAPY PLAN:  Continue with consolidative immunotherapy.  All questions were answered. The patient knows to call the  clinic with any problems, questions or concerns. We can certainly see the patient much sooner if necessary.    This note is electronically signed by: Twana First, MD 06/02/2017 12:35 PM

## 2017-06-15 NOTE — Progress Notes (Signed)
Erik Gravel, MD [address] [place]  [address]  Adenocarcinoma of right lung [facility]) - Plan: TSH  CURRENT THERAPY: Darvalumab every 2 weeks x 52 weeks beginning on 12/02/2016.  INTERVAL HISTORY: Erik [name] 56 y.o. male returns for followup of Stage IIIA (T3N2M0) adenocarcinoma of the right lung, diagnosed on RML biopsy by Dr. Roxan Hockey on 07/21/2016.  S/P concomitant chemoXRT with Cisplatin/Etoposide (08/17/2016- 09/20/2016).  Now on immunotherapy for up to 52 weeks in the consolidative setting per NCCN guidelines with Darvalumab.    Adenocarcinoma of right lung ([place])   07/01/2016 PET scan    6.3 x 3.7 cm right lower lobe mass, suspicious for primary bronchogenic neoplasm, as described above. Hypermetabolic thoracic nodal metastases, as above. Additional right perihilar hypermetabolism, indeterminate. Associated right middle lobe atelectasis/collapse.      07/19/2016 Procedure    Bronchoscopy with brushings and biopsies and endobronchial ultrasound with mediastinal lymph node aspirations by Dr. Roxan Hockey      07/21/2016 Pathology Results    Lung, biopsy, Right Middle Lobe - LUNG TISSUE WITH SQUAMOUS METAPLASIA. - NO MALIGNANCY IDENTIFIED.      07/21/2016 Pathology Results    FINE NEEDLE ASPIRATION, ENDOSCOPIC (A) LEVEL 7 (SPECIMEN 1 OF 3, COLLECTED ON 07/19/16): MALIGNANT CELLS CONSISTENT WITH ADENOCARCINOMA.      07/21/2016 Pathology Results    FINE NEEDLE ASPIRATION, EBUS, 4R, B (SPECIMEN 2 OF 3, COLLECTED 07/19/16): MALIGNANT CELLS CONSISTENT WITH ADENOCARCINOMA.      07/21/2016 Pathology Results    FINE NEEDLE ASPIRATION, EBUS, BRUSHING, RIGHT MIDDLE LOBE, D (SPECIMEN 3 OF 3, COLLECTED 07/19/16): MALIGNANT CELLS CONSISTENT WITH ADENOCARCINOMA.      07/22/2016 Imaging    MRI brain- No evidence of intracranial metastases.      08/17/2016 - 09/20/2016 Chemotherapy    The patient had palonosetron (ALOXI) injection 0.25 mg, 0.25 mg, Intravenous,   Once, 1 of 1 cycle  CISplatin (PLATINOL) 123 mg in sodium chloride 0.9 % 500 mL chemo infusion, 50 mg/m2 = 123 mg, Intravenous,  Once, 1 of 1 cycle  etoposide (VEPESID) 120 mg in sodium chloride 0.9 % 500 mL chemo infusion, 50 mg/m2 = 120 mg, Intravenous,  Once, 1 of 1 cycle  fosaprepitant (EMEND) 150 mg, dexamethasone (DECADRON) 12 mg in sodium chloride 0.9 % 145 mL IVPB, , Intravenous,  Once, 1 of 1 cycle  ondansetron (ZOFRAN) 8 mg in sodium chloride 0.9 % 50 mL IVPB, , Intravenous,  Once, 2 of 6 cycles  for chemotherapy treatment.        08/17/2016 -  Radiation Therapy         11/15/2016 Imaging    CT CAP- Interval response to therapy. The previously demonstrated mediastinal and right hilar adenopathy and resulting right middle lobe atelectasis have all improved. 2. No discrete residual lung masses are identified. There is new multifocal ground-glass opacity within the right lower lobe, and to a lesser extent in the left upper lobe. These are probably inflammatory/treatment related. 3. No evidence of abdominopelvic metastatic disease. Stable prominent lymph nodes in the upper abdomen, likely reactive. 4. Decompressed mid SVC without specific signs of SVC occlusion.      12/02/2016 -  Chemotherapy    Imfinzi (durvalumab) immunotherapy every 2 weeks x up to 1 year      01/03/2017 Procedure    EGD by Dr. [name], colonoscopy aborted as "patient forgot to take other half of preparation). Esophagitis. likely radiation-induced ?Dilated.  Erythematous mucosa in the  stomach. Biopsied. Normal duodenal bulb and second portion of the duodenum.      02/14/2017 Imaging    CT chest- 1. Development of a small to moderate right-sided pleural effusion with minimal loculation anteriorly. 2. Worsened right-sided aeration with right middle and lower lobe consolidation. As this has a geographic distribution, this could be radiation induced or represent infection. Depending on clinical concern of  progressive disease, thoracentesis and/or PET may be informative. 3. Development of mild right paratracheal adenopathy, most likely reactive. Recommend attention on follow-up. 4. Subtle findings which are highly suspicious for mild cirrhosis. Upper abdominal adenopathy is similar and likely reactive. 5. Persistent right lower and improved left upper lobe ground-glass opacities are favored to be infectious or inflammatory.      06/01/2017 Imaging    CT chest  IMPRESSION: 1. Evolving radiation changes in the medial right hemithorax. 2. Stable moderate size right pleural effusion without definite nodular components. 3. No signs of local recurrence or progressive metastatic disease. No residual enlarged thoracic lymph nodes. 4. Stable appearance of the visualized upper abdomen with probable cirrhosis and reactive adenopathy in the porta hepatis.       HPI Elements   Location: Right lung  Quality: Adenocarcinoma  Severity: Stage IIIA  Duration: Dx on 07/21/2016  Context: S/P chemoXRT with Cisplatin/Etoposide (08/17/2016- 09/20/2016).  Timing:   Modifying Factors:   Associated Signs & Symptoms: Chronic cough    He reports ongoing cough.  He notes that it is not worse.  On imaging, he has a stable moderate right pleural effusion with post-radiation effects.  No indication for pneumnonitis at this time.  I have a low threshold for treating patient for pneumonitis given his XRT + current immunotherapy.  Review of Systems  Constitutional: Negative.  Negative for chills, fever and weight loss.  HENT: Negative.   Eyes: Negative.   Respiratory: Positive for cough. Negative for hemoptysis, sputum production and shortness of breath.   Cardiovascular: Negative.  Negative for chest pain.  Gastrointestinal: Negative.  Negative for blood in stool, constipation, diarrhea, melena, nausea and vomiting.  Genitourinary: Negative.   Musculoskeletal: Negative.   Skin: Negative.   Neurological:  Negative.  Negative for weakness.  Endo/Heme/Allergies: Negative.   Psychiatric/Behavioral: Negative.     Past Medical History:  Diagnosis Date  . Arnold-Chiari syndrome ([place])   . Arthritis   . Asthma   . Chronic back pain   . Depression   . GERD (gastroesophageal reflux disease)   . Hypertension   . Seasonal allergies   . Spinal stenosis of lumbar region   . Squamous cell carcinoma of right lung ([place]) 07/23/2016  . Wheezing     Past Surgical History:  Procedure Laterality Date  . BACK SURGERY     5 total  . BIOPSY  01/03/2017   Procedure: BIOPSY;  Surgeon: Daneil Dolin, MD;  [facility];  Service: Endoscopy;;  gastric  . COLONOSCOPY WITH PROPOFOL N/A 04/04/2017   Procedure: COLONOSCOPY WITH PROPOFOL;  Surgeon: Daneil Dolin, MD;  [facility];  Service: Endoscopy;  Laterality: N/A;  8:45am  . ESOPHAGOGASTRODUODENOSCOPY (EGD) WITH PROPOFOL N/A 01/03/2017   Procedure: ESOPHAGOGASTRODUODENOSCOPY (EGD) WITH PROPOFOL;  Surgeon: Daneil Dolin, MD;  [facility];  Service: Endoscopy;  Laterality: N/A;  . Venia Minks DILATION N/A 01/03/2017   Procedure: Venia Minks DILATION;  Surgeon: Daneil Dolin, MD;  [facility];  Service: Endoscopy;  Laterality: N/A;  . MULTIPLE EXTRACTIONS WITH ALVEOLOPLASTY N/A 07/08/2014  Procedure: MULTIPLE EXTRACION WITH ALVEOLOPLASTY with EXCISION LESION RIGHT SIDE OF TONGUE;  Surgeon: Gae Bon, DDS;  Location: [place];  Service: Oral Surgery;  Laterality: N/A;  . PORTACATH PLACEMENT Right 07/30/2016   Procedure: INSERTION PORT-A-CATH;  Surgeon: Vickie Epley, MD;  Location: [facility];  Service: Vascular;  Laterality: Right;  [name] VIDEO BRONCHOSCOPY WITH ENDOBRONCHIAL ULTRASOUND N/A 07/19/2016   Procedure: VIDEO BRONCHOSCOPY WITH ENDOBRONCHIAL ULTRASOUND;  Surgeon: [name], MD;  Location: [facility];  Service: Thoracic;  Laterality: N/A;    Family History  Problem Relation Age of Onset  . Cancer Mother         breast cancer  . Heart failure Brother   . Colon cancer Neg Hx        not sure, ?grandfather and/or uncle    Social History   Social History  . Marital status: Divorced    Spouse name: N/A  . Number of children: N/A  . Years of education: N/A   Social History Main Topics  . Smoking status: Current Every Day Smoker    Packs/day: 0.25    Years: 38.00    Types: Cigarettes  . Smokeless tobacco: Former Systems developer    Types: Snuff     Comment: using chantix- smoking once in a while  . Alcohol use 1.2 oz/week    2 Cans of beer per week     Comment: reports drinking 0-2 beers a week  . Drug use: Yes    Frequency: 7.0 times per week    Types: Marijuana     Comment: daily for cancer  . Sexual activity: Not Currently   Other Topics Concern  . None   Social History Narrative  . None     PHYSICAL EXAMINATION  ECOG PERFORMANCE STATUS: 1 - Symptomatic but completely ambulatory  There were no vitals filed for this visit.  Vitals - 1 value per visit 7/86/7672  SYSTOLIC 094  DIASTOLIC 76  Pulse 96  Temperature 98.3  Respirations 18  Weight (lb) 219.8    GENERAL:alert, no distress, well nourished, well developed, comfortable, cooperative, obese, smiling and unaccompanied SKIN: skin color, texture, turgor are normal, no rashes or significant lesions, positive for: multiple ecchymoses on upper extremities. HEAD: Normocephalic, No masses, lesions, tenderness or abnormalities EYES: normal, EOMI, Conjunctiva are pink and non-injected EARS: External ears normal OROPHARYNX:lips, buccal mucosa, and tongue normal and mucous membranes are moist  NECK: supple, no adenopathy, trachea midline LYMPH:  no palpable lymphadenopathy BREAST:not examined LUNGS: positive findings: rhonchi  diffusely, wheezing  Diffusely, no breath sounds in RML and RLL HEART: regular rate & rhythm, no murmurs and no gallops ABDOMEN:abdomen soft, non-tender and normal bowel sounds BACK: Back symmetric, no  curvature. EXTREMITIES:less then 2 second capillary refill, no joint deformities, effusion, or inflammation, no skin discoloration, no cyanosis  NEURO: alert & oriented x 3 with fluent speech, no focal motor/sensory deficits, gait normal  LABORATORY DATA: CBC    Component Value Date/Time   WBC 8.3 06/02/2017 0839   RBC 4.74 06/02/2017 0839   HGB 15.3 06/02/2017 0839   HCT 44.4 06/02/2017 0839   PLT 144 (L) 06/02/2017 0839   MCV 93.7 06/02/2017 0839   MCH 32.3 06/02/2017 0839   MCHC 34.5 06/02/2017 0839   RDW 15.1 06/02/2017 0839   LYMPHSABS 2.1 06/02/2017 0839   MONOABS 0.6 06/02/2017 0839   EOSABS 0.1 06/02/2017 0839   BASOSABS 0.0 06/02/2017 0839      Chemistry  Component Value Date/Time   NA 136 06/02/2017 0839   K 3.5 06/02/2017 0839   CL 101 06/02/2017 0839   CO2 28 06/02/2017 0839   BUN 9 06/02/2017 0839   CREATININE 0.81 06/02/2017 0839      Component Value Date/Time   CALCIUM 8.9 06/02/2017 0839   ALKPHOS 140 (H) 06/02/2017 0839   AST 20 06/02/2017 0839   ALT 18 06/02/2017 0839   BILITOT 0.8 06/02/2017 0839     Lab Results  Component Value Date   TSH 1.710 05/19/2017     PENDING LABS:   RADIOGRAPHIC STUDIES:  Ct Chest W Contrast  Result Date: 06/01/2017 CLINICAL DATA:  Stage III non-small cell lung cancer diagnosed in July 2017. Chemotherapy and radiation therapy completed. Immunotherapy ongoing. EXAM: CT CHEST WITH CONTRAST TECHNIQUE: Multidetector CT imaging of the chest was performed during intravenous contrast administration. CONTRAST:  64mL ISOVUE-300 IOPAMIDOL (ISOVUE-300) INJECTION 61% COMPARISON:  CT 02/14/2017 and 11/15/2016. FINDINGS: Cardiovascular: Right IJ Port-A-Cath tip extends to the superior right atrium, stable. Stable mild aortic atherosclerosis. No acute vascular findings. The heart size is stable. There is a stable small pericardial effusion. Mediastinum/Nodes: Previously noted high right paratracheal node appears smaller,  measuring 7 mm on image 27 (previously 11 mm). No other discrete enlarged mediastinal, hilar or axillary lymph nodes. There is confluent soft tissue around the right hilum attributed to previous radiation therapy. The thyroid gland and trachea appear unremarkable. There is possible mild diffuse esophageal wall thickening. Lungs/Pleura: Moderate size, predominantly dependent right pleural effusion appears partially loculated anterolaterally. No definite pleural nodularity. There are evolving paramediastinal radiation changes in the right lung, and to a lesser degree medially in the left upper lobe. No worsening airspace disease or developing pulmonary nodules are identified. There is stable mild subpleural reticulation in both lungs. Upper abdomen: The visualized upper abdomen appears stable. There is mild contour irregularity of the liver with relative enlargement of the caudate and lateral lobes. There are prominent lymph nodes in the porta hepatis as before, likely reactive. No adrenal mass. Musculoskeletal/Chest wall: There is no chest wall mass or suspicious osseous finding. IMPRESSION: 1. Evolving radiation changes in the medial right hemithorax. 2. Stable moderate size right pleural effusion without definite nodular components. 3. No signs of local recurrence or progressive metastatic disease. No residual enlarged thoracic lymph nodes. 4. Stable appearance of the visualized upper abdomen with probable cirrhosis and reactive adenopathy in the porta hepatis. Electronically Signed   By: Richardean Sale M.D.   On: 06/01/2017 16:50     PATHOLOGY:    ASSESSMENT AND PLAN:  Adenocarcinoma of right lung ([place]) Stage IIIA (T3N2M0) adenocarcinoma of the right lung, diagnosed on RML biopsy by Dr. Roxan Hockey on 07/21/2016.  S/P concomitant chemoXRT with Cisplatin/Etoposide (08/17/2016- 09/20/2016).  Now on immunotherapy for up to 52 weeks in the consolidative setting per NCCN guidelines with Darvalumab.  Labs  today: CBC diff, CMET, TSH.  I personally reviewed and went over laboratory results with the patient.  The results are noted within this dictation.  Labs satisfy treatment parameters today.  Nursing, in accordance with immunotherapy administration protocol, will monitor for acute side effects/toxicities associated with immunotherapy administration today.  Labs in 2 weeks with treatment: CBC diff, CMET. Labs in 4 weeks with treatment: CBC diff, CMET, TSH. Labs in 6 weeks with treatment: CBC diff, CMET.  Smoking cessation education provided today.  He denies any issues related to Imfinzi: Peripheral edema, fatigue, skin rash, hyponatremia, constipation, decreased appetite, nausea, abdominal  pain, colitis, diarrhea, tract infection, lymphocytopenia, infection, muscular skeletal pain, dyspnea, dyspnea on exertion, fever.  I personally reviewed and went over radiographic studies with the patient.  The results are noted within this dictation.  I personally reviewed the images in PACS.  CT of chest on 06/01/2017 demonstrated evolving radiation changes in the medial right hemithorax, stable moderate sized right pleural effusion without definite nodular components, and no signs of local recurrence or progressive metastatic disease.  No residual enlarged thoracic lymph nodes noted.  Stable appearance of upper abdomen with probable cirrhosis of liver with reactive adenopathy in the porta hepatis.  Continue treatment every 2 weeks.  He complains of his chronic cough.  It is non-productive and he denies hemoptysis.  He reports that it is stable and not worse.  I have recommended Delysm.  On restaging imaging, he does have a right moderate pleural effusion that is stable going back over time.  I have discussed a therapeutic and diagnostic thoracentesis, but the patient has declined at this time.  He will consider thi moving forward.  I would have a low threshold to pursue thoracentesis to symptomatic relief and  send for cytology to confirm no malignancy.  Additionally, since his cough is stable, I have a lower suspicion for pneumonitis, although if it worsens, I would not hesitate to hold imfinzi and treat with corticosteroids for pneumonitis (he is at higher risk given past XRT and current immunotherapy).  Return in 4-6 weeks for ongoing follow-up with treatment.  ORDERS PLACED FOR THIS ENCOUNTER: Orders Placed This Encounter  Procedures  . TSH    MEDICATIONS PRESCRIBED THIS ENCOUNTER: Meds ordered this encounter  Medications  . levofloxacin (LEVAQUIN) 500 MG tablet    THERAPY PLAN:  Continue with consolidative treatment for up to 52 weeks.  All questions were answered. The patient knows to call the clinic with any problems, questions or concerns. We can certainly see the patient much sooner if necessary.  Patient and plan discussed with Dr. Twana First and she is in agreement with the aforementioned.   This note is electronically signed by: Doy Mince 06/16/2017 9:43 AM

## 2017-06-15 NOTE — Assessment & Plan Note (Addendum)
Stage IIIA (T3N2M0) adenocarcinoma of the right lung, diagnosed on RML biopsy by Dr. Roxan Hockey on 07/21/2016.  S/P concomitant chemoXRT with Cisplatin/Etoposide (08/17/2016- 09/20/2016).  Now on immunotherapy for up to 52 weeks in the consolidative setting per NCCN guidelines with Darvalumab.  Labs today: CBC diff, CMET, TSH.  I personally reviewed and went over laboratory results with the patient.  The results are noted within this dictation.  Labs satisfy treatment parameters today.  Nursing, in accordance with immunotherapy administration protocol, will monitor for acute side effects/toxicities associated with immunotherapy administration today.  Labs in 2 weeks with treatment: CBC diff, CMET. Labs in 4 weeks with treatment: CBC diff, CMET, TSH. Labs in 6 weeks with treatment: CBC diff, CMET.  Smoking cessation education provided today.  He denies any issues related to Imfinzi: Peripheral edema, fatigue, skin rash, hyponatremia, constipation, decreased appetite, nausea, abdominal pain, colitis, diarrhea, tract infection, lymphocytopenia, infection, muscular skeletal pain, dyspnea, dyspnea on exertion, fever.  I personally reviewed and went over radiographic studies with the patient.  The results are noted within this dictation.  I personally reviewed the images in PACS.  CT of chest on 06/01/2017 demonstrated evolving radiation changes in the medial right hemithorax, stable moderate sized right pleural effusion without definite nodular components, and no signs of local recurrence or progressive metastatic disease.  No residual enlarged thoracic lymph nodes noted.  Stable appearance of upper abdomen with probable cirrhosis of liver with reactive adenopathy in the porta hepatis.  Continue treatment every 2 weeks.  He complains of his chronic cough.  It is non-productive and he denies hemoptysis.  He reports that it is stable and not worse.  I have recommended Delysm.  On restaging imaging, he  does have a right moderate pleural effusion that is stable going back over time.  I have discussed a therapeutic and diagnostic thoracentesis, but the patient has declined at this time.  He will consider thi moving forward.  I would have a low threshold to pursue thoracentesis to symptomatic relief and send for cytology to confirm no malignancy.  Additionally, since his cough is stable, I have a lower suspicion for pneumonitis, although if it worsens, I would not hesitate to hold imfinzi and treat with corticosteroids for pneumonitis (he is at higher risk given past XRT and current immunotherapy).  Return in 4-6 weeks for ongoing follow-up with treatment.

## 2017-06-16 ENCOUNTER — Encounter (HOSPITAL_COMMUNITY): Payer: Medicare Other | Attending: Hematology | Admitting: Oncology

## 2017-06-16 ENCOUNTER — Encounter (HOSPITAL_COMMUNITY): Payer: Self-pay | Admitting: Oncology

## 2017-06-16 ENCOUNTER — Encounter (HOSPITAL_BASED_OUTPATIENT_CLINIC_OR_DEPARTMENT_OTHER): Payer: Medicare Other

## 2017-06-16 VITALS — BP 127/74 | HR 96 | Temp 98.1°F | Resp 16 | Wt 219.8 lb

## 2017-06-16 DIAGNOSIS — R05 Cough: Secondary | ICD-10-CM | POA: Diagnosis not present

## 2017-06-16 DIAGNOSIS — C3491 Malignant neoplasm of unspecified part of right bronchus or lung: Secondary | ICD-10-CM

## 2017-06-16 DIAGNOSIS — D751 Secondary polycythemia: Secondary | ICD-10-CM | POA: Diagnosis not present

## 2017-06-16 DIAGNOSIS — Z5112 Encounter for antineoplastic immunotherapy: Secondary | ICD-10-CM | POA: Diagnosis present

## 2017-06-16 DIAGNOSIS — F17218 Nicotine dependence, cigarettes, with other nicotine-induced disorders: Secondary | ICD-10-CM

## 2017-06-16 DIAGNOSIS — Z95828 Presence of other vascular implants and grafts: Secondary | ICD-10-CM

## 2017-06-16 DIAGNOSIS — J918 Pleural effusion in other conditions classified elsewhere: Secondary | ICD-10-CM | POA: Diagnosis not present

## 2017-06-16 DIAGNOSIS — Z716 Tobacco abuse counseling: Secondary | ICD-10-CM | POA: Diagnosis not present

## 2017-06-16 LAB — COMPREHENSIVE METABOLIC PANEL
ALK PHOS: 141 U/L — AB (ref 38–126)
ALT: 18 U/L (ref 17–63)
AST: 25 U/L (ref 15–41)
Albumin: 3.3 g/dL — ABNORMAL LOW (ref 3.5–5.0)
Anion gap: 6 (ref 5–15)
BUN: 9 mg/dL (ref 6–20)
CHLORIDE: 97 mmol/L — AB (ref 101–111)
CO2: 34 mmol/L — ABNORMAL HIGH (ref 22–32)
CREATININE: 0.97 mg/dL (ref 0.61–1.24)
Calcium: 8.6 mg/dL — ABNORMAL LOW (ref 8.9–10.3)
GFR calc Af Amer: >60 mL/min (ref >60)
Glucose, Bld: 125 mg/dL — ABNORMAL HIGH (ref 65–99)
Potassium: 4.1 mmol/L (ref 3.5–5.1)
Sodium: 137 mmol/L (ref 135–145)
Total Bilirubin: 0.5 mg/dL (ref 0.3–1.2)
Total Protein: 7.1 g/dL (ref 6.5–8.1)

## 2017-06-16 LAB — CBC WITH DIFFERENTIAL/PLATELET
Basophils Absolute: 0 10*3/uL (ref 0.0–0.1)
Basophils Relative: 0 %
EOS ABS: 0.1 10*3/uL (ref 0.0–0.7)
EOS PCT: 2 %
HCT: 43.3 % (ref 39.0–52.0)
HEMOGLOBIN: 14.2 g/dL (ref 13.0–17.0)
LYMPHS ABS: 1.2 10*3/uL (ref 0.7–4.0)
Lymphocytes Relative: 22 %
MCH: 31.8 pg (ref 26.0–34.0)
MCHC: 32.8 g/dL (ref 30.0–36.0)
MCV: 97.1 fL (ref 78.0–100.0)
MONO ABS: 0.5 10*3/uL (ref 0.1–1.0)
Monocytes Relative: 9 %
Neutro Abs: 3.6 10*3/uL (ref 1.7–7.7)
Neutrophils Relative %: 67 %
PLATELETS: 170 10*3/uL (ref 150–400)
RBC: 4.46 MIL/uL (ref 4.22–5.81)
RDW: 15.8 % — ABNORMAL HIGH (ref 11.5–15.5)
WBC: 5.5 10*3/uL (ref 4.0–10.5)

## 2017-06-16 LAB — TSH: TSH: 1.509 u[IU]/mL (ref 0.350–4.500)

## 2017-06-16 MED ORDER — SODIUM CHLORIDE 0.9 % IV SOLN
10.2000 mg/kg | Freq: Once | INTRAVENOUS | Status: AC
  Administered 2017-06-16: 1000 mg via INTRAVENOUS
  Filled 2017-06-16: qty 20

## 2017-06-16 MED ORDER — SODIUM CHLORIDE 0.9 % IV SOLN
INTRAVENOUS | Status: DC
  Administered 2017-06-16: 11:00:00 via INTRAVENOUS

## 2017-06-16 MED ORDER — ONDANSETRON 8 MG PO TBDP
8.0000 mg | ORAL_TABLET | Freq: Once | ORAL | Status: AC
  Administered 2017-06-16: 8 mg via ORAL
  Filled 2017-06-16: qty 1

## 2017-06-16 MED ORDER — HEPARIN SOD (PORK) LOCK FLUSH 100 UNIT/ML IV SOLN
500.0000 [IU] | Freq: Once | INTRAVENOUS | Status: AC
  Administered 2017-06-16: 500 [IU] via INTRAVENOUS

## 2017-06-16 NOTE — Patient Instructions (Addendum)
[place] at [facility] Discharge Instructions  RECOMMENDATIONS MADE BY THE CONSULTANT AND ANY TEST RESULTS WILL BE SENT TO YOUR REFERRING PHYSICIAN.  You were seen today by Manning Charity You are getting treatment today with labs You will continue to get treatment every 2 weeks Follow up in 4 -6 weeks   You can take Delsym over the counter for your cough.    Thank you for choosing [place] at [facility] to provide your oncology and hematology care.  To afford each patient quality time with our provider, please arrive at least 15 minutes before your scheduled appointment time.    If you have a lab appointment with the [facility] please come in thru the  Main Entrance and check in at the main information desk  You need to re-schedule your appointment should you arrive 10 or more minutes late.  We strive to give you quality time with our providers, and arriving late affects you and other patients whose appointments are after yours.  Also, if you no show three or more times for appointments you may be dismissed from the clinic at the providers discretion.     Again, thank you for choosing [facility].  Our hope is that these requests will decrease the amount of time that you wait before being seen by our physicians.       _____________________________________________________________  Should you have questions after your visit to [facility], please contact our office at [phone] between the hours of 8:30 a.m. and 4:30 p.m.  Voicemails left after 4:30 p.m. will not be returned until the following business day.  For prescription refill requests, have your pharmacy contact our office.       Resources For Cancer Patients and their Caregivers ? American Cancer Society: Can assist with transportation, wigs, general needs, runs Look Good Feel Better.        (978) 349-5771 ? Cancer Care: Provides financial  assistance, online support groups, medication/co-pay assistance.  1-800-813-HOPE (646) 751-7843) ? [place] Assists [place] cancer patients and their families through emotional , educational and financial support.  (802)306-9012 ? [place] DSS Where to apply for food stamps, Medicaid and utility assistance. (458)416-4133 ? [facility]: Transportation to medical appointments. (213)231-3274 ? Social Security Administration: May apply for disability if have a Stage IV cancer. (902) 522-5345 5793044256 ? [employer], Disability and Transit Services: Assists with nutrition, care and transit needs. [place] Support Programs: @10RELATIVEDAYS @ > Cancer Support Group  2nd Tuesday of the month 1pm-2pm, Journey Room  > Creative Journey  3rd Tuesday of the month 1130am-1pm, Journey Room  > Look Good Feel Better  1st Wednesday of the month 10am-12 noon, Journey Room (Call [facility] to register [phone])

## 2017-06-16 NOTE — Progress Notes (Signed)
Per tx plan, VS monitor q 15 min during first hour of infusion - pt has had 14 infusions of Imfinzi w/o adverse reaction.  Discussed with Dr. Talbert Cage; okay per MD to just obtain VS before infusion and after completion of infusion, or if pt experiences hypersensitivity reaction s/s.   Tolerated infusion w/o adverse reaction.  Alert, in no distress.  VSS.  Discharged ambulatory.

## 2017-06-20 ENCOUNTER — Other Ambulatory Visit (HOSPITAL_COMMUNITY): Payer: Self-pay | Admitting: Adult Health

## 2017-06-20 DIAGNOSIS — K219 Gastro-esophageal reflux disease without esophagitis: Secondary | ICD-10-CM

## 2017-06-20 DIAGNOSIS — C3491 Malignant neoplasm of unspecified part of right bronchus or lung: Secondary | ICD-10-CM

## 2017-06-30 ENCOUNTER — Encounter (HOSPITAL_COMMUNITY): Payer: Self-pay

## 2017-06-30 ENCOUNTER — Encounter (HOSPITAL_BASED_OUTPATIENT_CLINIC_OR_DEPARTMENT_OTHER): Payer: Medicare Other

## 2017-06-30 VITALS — BP 116/83 | HR 85 | Temp 98.2°F | Resp 18 | Wt 214.8 lb

## 2017-06-30 DIAGNOSIS — C3491 Malignant neoplasm of unspecified part of right bronchus or lung: Secondary | ICD-10-CM

## 2017-06-30 DIAGNOSIS — D751 Secondary polycythemia: Secondary | ICD-10-CM | POA: Diagnosis not present

## 2017-06-30 DIAGNOSIS — Z5112 Encounter for antineoplastic immunotherapy: Secondary | ICD-10-CM

## 2017-06-30 LAB — CBC WITH DIFFERENTIAL/PLATELET
BASOS ABS: 0 10*3/uL (ref 0.0–0.1)
BASOS PCT: 0 %
EOS ABS: 0.1 10*3/uL (ref 0.0–0.7)
EOS PCT: 1 %
HCT: 45.4 % (ref 39.0–52.0)
Hemoglobin: 15.5 g/dL (ref 13.0–17.0)
LYMPHS PCT: 23 %
Lymphs Abs: 1.6 10*3/uL (ref 0.7–4.0)
MCH: 32.2 pg (ref 26.0–34.0)
MCHC: 34.1 g/dL (ref 30.0–36.0)
MCV: 94.4 fL (ref 78.0–100.0)
MONO ABS: 0.5 10*3/uL (ref 0.1–1.0)
Monocytes Relative: 7 %
Neutro Abs: 4.8 10*3/uL (ref 1.7–7.7)
Neutrophils Relative %: 69 %
PLATELETS: 155 10*3/uL (ref 150–400)
RBC: 4.81 MIL/uL (ref 4.22–5.81)
RDW: 15.5 % (ref 11.5–15.5)
WBC: 7 10*3/uL (ref 4.0–10.5)

## 2017-06-30 LAB — COMPREHENSIVE METABOLIC PANEL
ALT: 17 U/L (ref 17–63)
AST: 23 U/L (ref 15–41)
Albumin: 3.4 g/dL — ABNORMAL LOW (ref 3.5–5.0)
Alkaline Phosphatase: 132 U/L — ABNORMAL HIGH (ref 38–126)
Anion gap: 7 (ref 5–15)
BUN: 10 mg/dL (ref 6–20)
CHLORIDE: 101 mmol/L (ref 101–111)
CO2: 28 mmol/L (ref 22–32)
Calcium: 8.8 mg/dL — ABNORMAL LOW (ref 8.9–10.3)
Creatinine, Ser: 0.78 mg/dL (ref 0.61–1.24)
GFR calc Af Amer: >60 mL/min (ref >60)
Glucose, Bld: 144 mg/dL — ABNORMAL HIGH (ref 65–99)
POTASSIUM: 3.7 mmol/L (ref 3.5–5.1)
Sodium: 136 mmol/L (ref 135–145)
Total Bilirubin: 0.4 mg/dL (ref 0.3–1.2)
Total Protein: 7.5 g/dL (ref 6.5–8.1)

## 2017-06-30 LAB — TSH: TSH: 1.686 u[IU]/mL (ref 0.350–4.500)

## 2017-06-30 MED ORDER — SODIUM CHLORIDE 0.9% FLUSH
10.0000 mL | INTRAVENOUS | Status: DC | PRN
  Administered 2017-06-30: 10 mL via INTRAVENOUS
  Filled 2017-06-30: qty 10

## 2017-06-30 MED ORDER — HEPARIN SOD (PORK) LOCK FLUSH 100 UNIT/ML IV SOLN
500.0000 [IU] | Freq: Once | INTRAVENOUS | Status: AC
  Administered 2017-06-30: 500 [IU] via INTRAVENOUS

## 2017-06-30 MED ORDER — ONDANSETRON 8 MG PO TBDP
ORAL_TABLET | ORAL | Status: AC
  Filled 2017-06-30: qty 1

## 2017-06-30 MED ORDER — SODIUM CHLORIDE 0.9 % IV SOLN
INTRAVENOUS | Status: DC
  Administered 2017-06-30: 12:00:00 via INTRAVENOUS

## 2017-06-30 MED ORDER — ONDANSETRON 8 MG PO TBDP
8.0000 mg | ORAL_TABLET | Freq: Once | ORAL | Status: AC
  Administered 2017-06-30: 8 mg via ORAL

## 2017-06-30 MED ORDER — SODIUM CHLORIDE 0.9 % IV SOLN
10.2000 mg/kg | Freq: Once | INTRAVENOUS | Status: AC
  Administered 2017-06-30: 1000 mg via INTRAVENOUS
  Filled 2017-06-30: qty 20

## 2017-06-30 NOTE — Patient Instructions (Signed)
[facility] Discharge Instructions for Patients Receiving Chemotherapy   Beginning January 23rd 2017 lab work for the [facility] will be done in the  Main lab at [facility] on 1st floor. If you have a lab appointment with the [facility] please come in thru the  Main Entrance and check in at the main information desk   Today you received the following chemotherapy agents Imfinzi. Follow-up as scheduled. Call clinic for any questions or concerns  To help prevent nausea and vomiting after your treatment, we encourage you to take your nausea medication   If you develop nausea and vomiting, or diarrhea that is not controlled by your medication, call the clinic.  The clinic phone number is [phone]. Office hours are Monday-Friday 8:30am-5:00pm.  BELOW ARE SYMPTOMS THAT SHOULD BE REPORTED IMMEDIATELY:  *FEVER GREATER THAN 101.0 F  *CHILLS WITH OR WITHOUT FEVER  NAUSEA AND VOMITING THAT IS NOT CONTROLLED WITH YOUR NAUSEA MEDICATION  *UNUSUAL SHORTNESS OF BREATH  *UNUSUAL BRUISING OR BLEEDING  TENDERNESS IN MOUTH AND THROAT WITH OR WITHOUT PRESENCE OF ULCERS  *URINARY PROBLEMS  *BOWEL PROBLEMS  UNUSUAL RASH Items with * indicate a potential emergency and should be followed up as soon as possible. If you have an emergency after office hours please contact your primary care physician or go to the nearest emergency department.  Please call the clinic during office hours if you have any questions or concerns.   You may also contact the Patient Navigator at [phone] should you have any questions or need assistance in obtaining follow up care.      Resources For Cancer Patients and their Caregivers ? American Cancer Society: Can assist with transportation, wigs, general needs, runs Look Good Feel Better.        769 235 8261 ? Cancer Care: Provides financial assistance, online support groups, medication/co-pay assistance.  1-800-813-HOPE  305-562-7817) ? [facility] Assists [place] cancer patients and their families through emotional , educational and financial support.  6146714500 ? [place] DSS Where to apply for food stamps, Medicaid and utility assistance. 651-561-3409 ? [facility]: Transportation to medical appointments. (647) 596-9177 ? Social Security Administration: May apply for disability if have a Stage IV cancer. 408-012-7571 231-377-2845 ? [employer], Disability and Transit Services: Assists with nutrition, care and transit needs. 587-873-2637

## 2017-06-30 NOTE — Progress Notes (Signed)
[name] tolerated Imfinzi infusion well without complaints or incident. Labs reviewed with Dr. Talbert Cage prior to administering this medication. VSS upon discharge. Pt discharged self ambulatory in satisfactory condition

## 2017-07-14 ENCOUNTER — Encounter (HOSPITAL_COMMUNITY): Payer: Self-pay

## 2017-07-14 ENCOUNTER — Encounter (HOSPITAL_COMMUNITY): Payer: Self-pay | Admitting: Adult Health

## 2017-07-14 ENCOUNTER — Encounter (HOSPITAL_COMMUNITY): Payer: Self-pay | Admitting: Emergency Medicine

## 2017-07-14 ENCOUNTER — Encounter (HOSPITAL_BASED_OUTPATIENT_CLINIC_OR_DEPARTMENT_OTHER): Payer: Medicare Other | Admitting: Adult Health

## 2017-07-14 ENCOUNTER — Ambulatory Visit (HOSPITAL_COMMUNITY)
Admission: RE | Admit: 2017-07-14 | Discharge: 2017-07-14 | Disposition: A | Discharge status: Home / Self Care | Payer: Medicare Other | Source: Ambulatory Visit | Attending: Adult Health | Admitting: Adult Health

## 2017-07-14 ENCOUNTER — Encounter (HOSPITAL_COMMUNITY): Payer: Medicare Other | Attending: Hematology

## 2017-07-14 DIAGNOSIS — J90 Pleural effusion, not elsewhere classified: Secondary | ICD-10-CM | POA: Insufficient documentation

## 2017-07-14 DIAGNOSIS — R059 Cough, unspecified: Secondary | ICD-10-CM

## 2017-07-14 DIAGNOSIS — K7460 Unspecified cirrhosis of liver: Secondary | ICD-10-CM | POA: Diagnosis not present

## 2017-07-14 DIAGNOSIS — R05 Cough: Secondary | ICD-10-CM | POA: Insufficient documentation

## 2017-07-14 DIAGNOSIS — R590 Localized enlarged lymph nodes: Secondary | ICD-10-CM | POA: Insufficient documentation

## 2017-07-14 DIAGNOSIS — R918 Other nonspecific abnormal finding of lung field: Secondary | ICD-10-CM | POA: Insufficient documentation

## 2017-07-14 DIAGNOSIS — R112 Nausea with vomiting, unspecified: Secondary | ICD-10-CM

## 2017-07-14 DIAGNOSIS — C3491 Malignant neoplasm of unspecified part of right bronchus or lung: Secondary | ICD-10-CM

## 2017-07-14 DIAGNOSIS — R0602 Shortness of breath: Secondary | ICD-10-CM

## 2017-07-14 DIAGNOSIS — R42 Dizziness and giddiness: Secondary | ICD-10-CM | POA: Diagnosis not present

## 2017-07-14 DIAGNOSIS — C3431 Malignant neoplasm of lower lobe, right bronchus or lung: Secondary | ICD-10-CM

## 2017-07-14 DIAGNOSIS — D751 Secondary polycythemia: Secondary | ICD-10-CM | POA: Insufficient documentation

## 2017-07-14 DIAGNOSIS — J432 Centrilobular emphysema: Secondary | ICD-10-CM | POA: Insufficient documentation

## 2017-07-14 DIAGNOSIS — J918 Pleural effusion in other conditions classified elsewhere: Secondary | ICD-10-CM | POA: Diagnosis not present

## 2017-07-14 LAB — COMPREHENSIVE METABOLIC PANEL
ALBUMIN: 3.3 g/dL — AB (ref 3.5–5.0)
ALK PHOS: 139 U/L — AB (ref 38–126)
ALT: 18 U/L (ref 17–63)
ANION GAP: 7 (ref 5–15)
AST: 24 U/L (ref 15–41)
BUN: 8 mg/dL (ref 6–20)
CALCIUM: 9 mg/dL (ref 8.9–10.3)
CHLORIDE: 100 mmol/L — AB (ref 101–111)
CO2: 30 mmol/L (ref 22–32)
CREATININE: 0.71 mg/dL (ref 0.61–1.24)
GFR calc non Af Amer: >60 mL/min (ref >60)
GLUCOSE: 111 mg/dL — AB (ref 65–99)
Potassium: 4 mmol/L (ref 3.5–5.1)
SODIUM: 137 mmol/L (ref 135–145)
Total Bilirubin: 0.6 mg/dL (ref 0.3–1.2)
Total Protein: 7.1 g/dL (ref 6.5–8.1)

## 2017-07-14 LAB — CBC WITH DIFFERENTIAL/PLATELET
Basophils Absolute: 0 10*3/uL (ref 0.0–0.1)
Basophils Relative: 0 %
Eosinophils Absolute: 0.1 10*3/uL (ref 0.0–0.7)
Eosinophils Relative: 2 %
HEMATOCRIT: 44.8 % (ref 39.0–52.0)
HEMOGLOBIN: 14.8 g/dL (ref 13.0–17.0)
LYMPHS ABS: 1.2 10*3/uL (ref 0.7–4.0)
Lymphocytes Relative: 19 %
MCH: 31.8 pg (ref 26.0–34.0)
MCHC: 33 g/dL (ref 30.0–36.0)
MCV: 96.3 fL (ref 78.0–100.0)
MONOS PCT: 8 %
Monocytes Absolute: 0.5 10*3/uL (ref 0.1–1.0)
NEUTROS ABS: 4.4 10*3/uL (ref 1.7–7.7)
NEUTROS PCT: 71 %
PLATELETS: 166 10*3/uL (ref 150–400)
RBC: 4.65 MIL/uL (ref 4.22–5.81)
RDW: 15.8 % — ABNORMAL HIGH (ref 11.5–15.5)
WBC: 6.3 10*3/uL (ref 4.0–10.5)

## 2017-07-14 MED ORDER — HEPARIN SOD (PORK) LOCK FLUSH 100 UNIT/ML IV SOLN
500.0000 [IU] | Freq: Once | INTRAVENOUS | Status: AC
  Administered 2017-07-14: 500 [IU] via INTRAVENOUS

## 2017-07-14 MED ORDER — HEPARIN SOD (PORK) LOCK FLUSH 100 UNIT/ML IV SOLN
INTRAVENOUS | Status: AC
Start: 2017-07-14 — End: 2017-07-14
  Filled 2017-07-14: qty 5

## 2017-07-14 MED ORDER — IOPAMIDOL (ISOVUE-300) INJECTION 61%: 75.0000 mL | Freq: Once | INTRAVENOUS | Status: DC | PRN

## 2017-07-14 NOTE — Patient Instructions (Signed)
[name] [facility] Discharge Instructions  RECOMMENDATIONS MADE BY THE CONSULTANT AND ANY TEST RESULTS WILL BE SENT TO YOUR REFERRING PHYSICIAN.  No treatment today. Your disease has progressed Will start new treatment next week.  Thank you for choosing [name] at [facility] to provide your oncology and hematology care.  To afford each patient quality time with our provider, please arrive at least 15 minutes before your scheduled appointment time.    If you have a lab appointment with the [facility] please come in thru the  Main Entrance and check in at the main information desk  You need to re-schedule your appointment should you arrive 10 or more minutes late.  We strive to give you quality time with our providers, and arriving late affects you and other patients whose appointments are after yours.  Also, if you no show three or more times for appointments you may be dismissed from the clinic at the providers discretion.     Again, thank you for choosing [facility].  Our hope is that these requests will decrease the amount of time that you wait before being seen by our physicians.       _____________________________________________________________  Should you have questions after your visit to [facility], please contact our office at [phone] between the hours of 8:30 a.m. and 4:30 p.m.  Voicemails left after 4:30 p.m. will not be returned until the following business day.  For prescription refill requests, have your pharmacy contact our office.       Resources For Cancer Patients and their Caregivers ? American [employer] Society: Can assist with transportation, wigs, general needs, runs Look Good Feel Better.        (249) 261-9839 ? Cancer Care: Provides financial assistance, online support groups, medication/co-pay assistance.  1-800-813-HOPE 279-203-6560) ? [place] Assists [place] cancer patients and their families through emotional , educational and financial support.  (318)019-9841 ? [place] DSS Where to apply for food stamps, Medicaid and utility assistance. 317-089-7394 ? [facility]: Transportation to medical appointments. (360) 352-7091 ? Social Security Administration: May apply for disability if have a Stage IV cancer. 7127900271 (737)302-1306 ? [employer], Disability and Transit Services: Assists with nutrition, care and transit needs. [place] Support Programs: @10RELATIVEDAYS @ > Cancer Support Group  2nd Tuesday of the month 1pm-2pm, Journey Room  > Creative Journey  3rd Tuesday of the month 1130am-1pm, Journey Room  > Look Good Feel Better  1st Wednesday of the month 10am-12 noon, Journey Room (Call [place] to register [phone])

## 2017-07-14 NOTE — Progress Notes (Signed)
Patient presented today for chemotherapy . Will not be giving chemotherapy today per MD. Will follow up next week to start a new treatment. Follow up as scheduled.

## 2017-07-14 NOTE — Progress Notes (Signed)
[name], [name] [address]   CLINIC:  Medical Oncology/Hematology  PCP:  [name], MD [address] 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 Hypermetabolic thoracic nodal metastases, as above. Additional right perihilar hypermetabolism, indeterminate. Associated right middle lobe atelectasis/collapse.      07/19/2016 Procedure    Bronchoscopy with brushings and biopsies and endobronchial ultrasound with mediastinal lymph node aspirations by Dr. Roxan Hockey      07/21/2016 Pathology Results    Lung, biopsy, Right Middle Lobe - LUNG TISSUE WITH SQUAMOUS METAPLASIA. - NO MALIGNANCY IDENTIFIED.      07/21/2016 Pathology Results    FINE NEEDLE ASPIRATION, ENDOSCOPIC (A) LEVEL 7 (SPECIMEN 1 OF 3, COLLECTED ON 07/19/16): MALIGNANT CELLS CONSISTENT WITH ADENOCARCINOMA.      07/21/2016 Pathology Results    FINE NEEDLE ASPIRATION, EBUS, 4R, B (SPECIMEN 2 OF 3, COLLECTED 07/19/16): MALIGNANT CELLS CONSISTENT WITH ADENOCARCINOMA.      07/21/2016 Pathology Results    FINE NEEDLE ASPIRATION, EBUS, BRUSHING, RIGHT MIDDLE LOBE, D (SPECIMEN 3 OF 3, COLLECTED 07/19/16): MALIGNANT CELLS CONSISTENT WITH ADENOCARCINOMA.      07/22/2016 Imaging    MRI brain- No evidence of intracranial metastases.      08/17/2016 - 09/20/2016 Chemotherapy    The patient had palonosetron (ALOXI) injection 0.25 mg, 0.25 mg, Intravenous,  Once, 1 of 1 cycle  CISplatin (PLATINOL) 123 mg in sodium chloride 0.9 % 500 mL chemo infusion, 50 mg/m2 = 123 mg, Intravenous,  Once, 1 of 1 cycle  etoposide  (VEPESID) 120 mg in sodium chloride 0.9 % 500 mL chemo infusion, 50 mg/m2 = 120 mg, Intravenous,  Once, 1 of 1 cycle  fosaprepitant (EMEND) 150 mg, dexamethasone (DECADRON) 12 mg in sodium chloride 0.9 % 145 mL IVPB, , Intravenous,  Once, 1 of 1 cycle  ondansetron (ZOFRAN) 8 mg in sodium chloride 0.9 % 50 mL IVPB, , Intravenous,  Once, 2 of 6 cycles  for chemotherapy treatment.        08/17/2016 -  Radiation Therapy         11/15/2016 Imaging    CT CAP- Interval response to therapy. The previously demonstrated mediastinal and right hilar adenopathy and resulting right middle lobe atelectasis have all improved. 2. No discrete residual lung masses are identified. There is new multifocal ground-glass opacity within the right lower lobe, and to a lesser extent in the left upper lobe. These are probably inflammatory/treatment related. 3. No evidence of abdominopelvic metastatic disease. Stable prominent lymph nodes in the upper abdomen, likely reactive. 4. Decompressed mid SVC without specific signs of SVC occlusion.      12/02/2016 -  Chemotherapy    Imfinzi (durvalumab) immunotherapy every 2 weeks x up to 1 year      01/03/2017 Procedure    EGD by Dr. [name], colonoscopy aborted as "patient forgot to take other half of preparation). Esophagitis. likely radiation-induced ?Dilated.  Erythematous mucosa in the stomach. Biopsied. Normal duodenal bulb and second portion of the duodenum.      02/14/2017 Imaging    CT chest- 1. Development of a small to  moderate right-sided pleural effusion with minimal loculation anteriorly. 2. Worsened right-sided aeration with right middle and lower lobe consolidation. As this has a geographic distribution, this could be radiation induced or represent infection. Depending on clinical concern of progressive disease, thoracentesis and/or PET may be informative. 3. Development of mild right paratracheal adenopathy, most likely reactive. Recommend  attention on follow-up. 4. Subtle findings which are highly suspicious for mild cirrhosis. Upper abdominal adenopathy is similar and likely reactive. 5. Persistent right lower and improved left upper lobe ground-glass opacities are favored to be infectious or inflammatory.      06/01/2017 Imaging    CT chest  IMPRESSION: 1. Evolving radiation changes in the medial right hemithorax. 2. Stable moderate size right pleural effusion without definite nodular components. 3. No signs of local recurrence or progressive metastatic disease. No residual enlarged thoracic lymph nodes. 4. Stable appearance of the visualized upper abdomen with probable cirrhosis and reactive adenopathy in the porta hepatis.        HISTORY OF PRESENT ILLNESS:  (From Erik Crigler, PA-C's last note on 01/27/17)       INTERVAL HISTORY:  Erik [name] 56 y.o. male returns for follow-up for lung cancer.   Due today for next dose of Imfinzi today.   Overall, he reports "I haven't been feeling too good."   States that his cough and shortness of breath have been worse in the past month or so. It is difficult for him to take a deep breath.  He think he may have some sinus congestion which is contributing as well; reports green/yellow mucus from his nose and in his sputum from cough.   Endorses occasional dizziness and N&V.  Energy levels are very low at 25%; appetite also low at 25%. He drinks Boost every once in awhile as well.  Endorses some trouble swallowing, which he thinks is related to his congestion. Denies any pain.   Continues to work on cutting down on his smoking; his PCP has been helping manage his cessation efforts. He has taken Chantix in the past.     REVIEW OF SYSTEMS:  Review of Systems  Constitutional: Positive for appetite change and fatigue. Negative for chills and fever.  HENT:   Positive for trouble swallowing.   Eyes: Negative.   Respiratory: Positive for cough and shortness of breath.     Cardiovascular: Negative.  Negative for chest pain.  Gastrointestinal: Positive for nausea and vomiting. Negative for abdominal pain, blood in stool, constipation and diarrhea.  Endocrine: Negative.   Genitourinary: Negative.  Negative for dysuria and hematuria.   Skin: Negative for rash.  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 Negative for headaches.  Hematological: Bruises/bleeds easily.  Psychiatric/Behavioral: Negative.      PAST MEDICAL/SURGICAL HISTORY:  Past Medical History:  Diagnosis Date  . Arnold-Chiari syndrome ([place])   . Arthritis   . Asthma   . Chronic back pain   . Depression   . GERD (gastroesophageal reflux disease)   . Hypertension   . Seasonal allergies   . Spinal stenosis of lumbar region   . 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 Wheezing    Past Surgical History:  Procedure Laterality Date  . BACK SURGERY     5 total  . BIOPSY  01/03/2017   Procedure: BIOPSY;  Surgeon: Daneil Dolin, MD;  [facility];  Service: Endoscopy;;  gastric  . COLONOSCOPY WITH PROPOFOL N/A 04/04/2017   Procedure:  COLONOSCOPY WITH PROPOFOL;  Surgeon: Daneil Dolin, MD;  [facility];  Service: Endoscopy;  Laterality: N/A;  8:45am  . ESOPHAGOGASTRODUODENOSCOPY (EGD) WITH PROPOFOL N/A 01/03/2017   Procedure: ESOPHAGOGASTRODUODENOSCOPY (EGD) WITH PROPOFOL;  Surgeon: Daneil Dolin, MD;  [facility];  Service: Endoscopy;  Laterality: N/A;  . Venia Minks DILATION N/A 01/03/2017   Procedure: Venia Minks DILATION;  Surgeon: Daneil Dolin, MD;  [facility];  Service: Endoscopy;  Laterality: N/A;  . MULTIPLE EXTRACTIONS WITH ALVEOLOPLASTY N/A 07/08/2014   Procedure: MULTIPLE EXTRACION WITH ALVEOLOPLASTY with EXCISION LESION RIGHT SIDE OF TONGUE;  Surgeon: Gae Bon, DDS;  Location: [facility];  Service: Oral Surgery;  Laterality: N/A;  . PORTACATH PLACEMENT Right 07/30/2016    Procedure: INSERTION PORT-A-CATH;  Surgeon: Vickie Epley, MD;  Location: [facility];  Service: Vascular;  Laterality: Right;  [name] VIDEO BRONCHOSCOPY WITH ENDOBRONCHIAL ULTRASOUND N/A 07/19/2016   Procedure: VIDEO BRONCHOSCOPY WITH ENDOBRONCHIAL ULTRASOUND;  Surgeon: [name], MD;  Location: [facility];  Service: Thoracic;  Laterality: N/A;     SOCIAL HISTORY:  Social History   Social History  . Marital status: Divorced    Spouse name: N/A  . Number of children: N/A  . Years of education: N/A   Occupational History  . Not on file.   Social History Main Topics  . Smoking status: Current Every Day Smoker    Packs/day: 0.25    Years: 38.00    Types: Cigarettes  . Smokeless tobacco: Former Systems developer    Types: Snuff     Comment: using chantix- smoking once in a while  . Alcohol use 1.2 oz/week    2 Cans of beer per week     Comment: reports drinking 0-2 beers a week  . Drug use: Yes    Frequency: 7.0 times per week    Types: Marijuana     Comment: daily for cancer  . Sexual activity: Not Currently   Other Topics Concern  . Not on file   Social History Narrative  . No narrative on file    FAMILY HISTORY:  Family History  Problem Relation Age of Onset  . Cancer Mother        breast cancer  . Heart failure Brother   . Colon cancer Neg Hx        not sure, ?grandfather and/or uncle    CURRENT MEDICATIONS:  Outpatient Encounter Prescriptions as of 07/14/2017  Medication Sig Note  . albuterol (PROVENTIL HFA;VENTOLIN HFA) 108 (90 BASE) MCG/ACT inhaler Inhale 2 puffs into the lungs every 6 (six) hours as needed for wheezing.    [name] ALPRAZolam (XANAX) 1 MG tablet Take 1 mg by mouth 2 (two) times daily as needed for anxiety or sleep (takes 1 tablet and bedtime for sleep and 1 dose during the day only if needed).    [name] amLODipine (NORVASC) 5 MG tablet Take 5 mg by mouth daily.    . Artificial Tear Solution (SOOTHE XP) SOLN Apply 2 drops to eye daily.   . budesonide-formoterol  (SYMBICORT) 160-4.5 MCG/ACT inhaler Inhale 2 puffs into the lungs daily.    [name] Dextromethorphan-Guaifenesin (CORICIDIN HBP CONGESTION/COUGH PO) Take 1 tablet by mouth 2 (two) times daily as needed (congestion).   [name] escitalopram (LEXAPRO) 20 MG tablet Take 20 mg by mouth daily.    [name] gabapentin (NEURONTIN) 300 MG capsule Take 600 mg by mouth 3 (three) times daily.    [name] guaiFENesin (MUCINEX) 600 MG 12 hr  tablet Take 600 mg by mouth 2 (two) times daily as needed (for congestion.).   [name] levofloxacin (LEVAQUIN) 500 MG tablet    . lidocaine (XYLOCAINE) 2 % solution Use as directed 20 mLs in the mouth or throat every 6 (six) hours as needed (for mouth pain (used prior to eating when needed)).   [name] lidocaine-prilocaine (EMLA) cream Apply a quarter size amount to port site 1 hour prior to chemo. Do not rub in. Cover with plastic wrap. (Patient taking differently: Apply 1 application topically See admin instructions. Apply a quarter size amount to port site 1 hour prior to chemo. Do not rub in. Cover with plastic wrap.)   . methadone (DOLOPHINE) 10 MG tablet Take 20 mg by mouth 5 (five) times daily. Takes 2 tablets 5 times daily   . Multiple Vitamin (MULTIVITAMIN WITH MINERALS) TABS tablet Take 1 tablet by mouth daily. Centrum   . Na Sulfate-K Sulfate-Mg Sulf (SUPREP BOWEL PREP KIT) 17.5-3.13-1.6 GM/180ML SOLN Take 1 kit by mouth as directed. (Patient taking differently: Take 1 kit by mouth as directed. ) 02/04/2017: Before colonoscopy.  . ondansetron (ZOFRAN) 8 MG tablet TAKE 1 TABLET BY MOUTH EVERY 8 HOURS AS NEEDED FOR NAUSEA AND VOMITING.   . pantoprazole (PROTONIX) 40 MG tablet TAKE (1) TABLET BY MOUTH TWICE DAILY.   [name] prochlorperazine (COMPAZINE) 10 MG tablet TAKE 1 TABLET EVERY 6 HOURS AS NEEDED FOR NAUSEA AND VOMITING.   [name] tiZANidine (ZANAFLEX) 4 MG tablet Take 4 mg by mouth every 8 (eight) hours as needed for muscle spasms.  03/29/2017: Takes it twice daily and 3 rd dose only if needed for muscle spasms  .  varenicline (CHANTIX) 1 MG tablet Take 1 mg by mouth 2 (two) times daily.   . [DISCONTINUED] DURVALUMAB IV Inject into the vein. Every 2 weeks    Facility-Administered Encounter Medications as of 07/14/2017  Medication  . 0.9 %  sodium chloride infusion    ALLERGIES:  Allergies  Allergen Reactions  . Demeclocycline Other (See Comments) and Swelling  . Tetracyclines & Related Swelling and Other (See Comments)          PHYSICAL EXAM:  ECOG Performance status: 1 - Symptomatic, but independent.      Physical Exam  Constitutional: He is oriented to person, place, and time and well-developed, well-nourished, and in no distress.  HENT:  Head: Normocephalic.  Mouth/Throat: Oropharynx is clear and moist.  Eyes: Conjunctivae are normal. No scleral icterus.  Neck: Normal range of motion. Neck supple.  Cardiovascular: Regular rhythm.   Tachycardic  Pulmonary/Chest: Effort normal.  -Absent breath sounds on the right. Respiratory effort is normal and currently no respiratory distress. O2 sat adequate at 94%.  -Mild rhonchi noted to LUL; diminished to LLL.   Abdominal: Soft. Bowel sounds are normal. There is no tenderness.  Musculoskeletal: Normal range of motion. He exhibits no edema.  Lymphadenopathy:    He has no cervical adenopathy.       Right: No supraclavicular adenopathy present.       Left: Supraclavicular adenopathy present.  Very subtle (L) supraclavicular LN palpable (not noted on prior physical exams).  Neurological: He is alert and oriented to person, place, and time. No cranial nerve deficit. Gait normal.  Skin: Skin is warm and dry. No rash noted.  Psychiatric: Mood, memory, affect and judgment normal.  Nursing note and vitals reviewed.    LABORATORY DATA:  I have reviewed the labs as listed.  CBC    Component Value  Date/Time   WBC 6.3 07/14/2017 1036   RBC 4.65 07/14/2017 1036   HGB 14.8 07/14/2017 1036   HCT 44.8 07/14/2017 1036   PLT 166 07/14/2017 1036    MCV 96.3 07/14/2017 1036   MCH 31.8 07/14/2017 1036   MCHC 33.0 07/14/2017 1036   RDW 15.8 (H) 07/14/2017 1036   LYMPHSABS 1.2 07/14/2017 1036   MONOABS 0.5 07/14/2017 1036   EOSABS 0.1 07/14/2017 1036   BASOSABS 0.0 07/14/2017 1036   CMP Latest Ref Rng & Units 07/14/2017 06/30/2017 06/16/2017  Glucose 65 - 99 mg/dL 111(H) 144(H) 125(H)  BUN 6 - 20 mg/dL 8 10 9   Creatinine 0.61 - 1.24 mg/dL 0.71 0.78 0.97  Sodium 135 - 145 mmol/L 137 136 137  Potassium 3.5 - 5.1 mmol/L 4.0 3.7 4.1  Chloride 101 - 111 mmol/L 100(L) 101 97(L)  CO2 22 - 32 mmol/L 30 28 34(H)  Calcium 8.9 - 10.3 mg/dL 9.0 8.8(L) 8.6(L)  Total Protein 6.5 - 8.1 g/dL 7.1 7.5 7.1  Total Bilirubin 0.3 - 1.2 mg/dL 0.6 0.4 0.5  Alkaline Phos 38 - 126 U/L 139(H) 132(H) 141(H)  AST 15 - 41 U/L 24 23 25   ALT 17 - 63 U/L 18 17 18     PENDING LABS:    DIAGNOSTIC IMAGING:  Last CT chest: 06/01/17 CLINICAL DATA:  Stage III non-small cell lung cancer diagnosed in July 2017. Chemotherapy and radiation therapy completed. Immunotherapy ongoing.  EXAM: CT CHEST WITH CONTRAST  TECHNIQUE: Multidetector CT imaging of the chest was performed during intravenous contrast administration.  CONTRAST:  89m ISOVUE-300 IOPAMIDOL (ISOVUE-300) INJECTION 61%  COMPARISON:  CT 02/14/2017 and 11/15/2016.  FINDINGS: Cardiovascular: Right IJ Port-A-Cath tip extends to the superior right atrium, stable. Stable mild aortic atherosclerosis. No acute vascular findings. The heart size is stable. There is a stable small pericardial effusion.  Mediastinum/Nodes: Previously noted high right paratracheal node appears smaller, measuring 7 mm on image 27 (previously 11 mm). No other discrete enlarged mediastinal, hilar or axillary lymph nodes. There is confluent soft tissue around the right hilum attributed to previous radiation therapy. The thyroid gland and trachea appear unremarkable. There is possible mild diffuse esophageal  wall thickening.  Lungs/Pleura: Moderate size, predominantly dependent right pleural effusion appears partially loculated anterolaterally. No definite pleural nodularity. There are evolving paramediastinal radiation changes in the right lung, and to a lesser degree medially in the left upper lobe. No worsening airspace disease or developing pulmonary nodules are identified. There is stable mild subpleural reticulation in both lungs.  Upper abdomen: The visualized upper abdomen appears stable. There is mild contour irregularity of the liver with relative enlargement of the caudate and lateral lobes. There are prominent lymph nodes in the porta hepatis as before, likely reactive. No adrenal mass.  Musculoskeletal/Chest wall: There is no chest wall mass or suspicious osseous finding.  IMPRESSION: 1. Evolving radiation changes in the medial right hemithorax. 2. Stable moderate size right pleural effusion without definite nodular components. 3. No signs of local recurrence or progressive metastatic disease. No residual enlarged thoracic lymph nodes. 4. Stable appearance of the visualized upper abdomen with probable cirrhosis and reactive adenopathy in the porta hepatis.   Electronically Signed   By: WRichardean SaleM.D.   On: 06/01/2017 16:50    Stat CT chest performed today: 07/14/17 CLINICAL DATA:  Cough. Shortness of breath. Stage IIIA adenocarcinoma of right lung. evaluate for immunotherapy induced pneumonitis versus worsening pleural effusion.  EXAM: CT CHEST WITH CONTRAST  TECHNIQUE: Multidetector  CT imaging of the chest was performed during intravenous contrast administration.  CONTRAST:  75 cc of Isovue-300  COMPARISON:  06/01/2017  FINDINGS: Cardiovascular: Right-sided Port-A-Cath terminates at the high right atrium. Normal aortic caliber. Normal heart size, without pericardial effusion. No central pulmonary embolism, on this non-dedicated  study.  Mediastinum/Nodes: A deep left subpectoral node measures 1.6 cm on image 18/series 2 and is position just cephalad to the left subclavian artery. This is new. Posterior left supraclavicular node measures 12 mm on image 8/series 2. Incompletely imaged on the prior, but likely enlarged.  Radiation induced mediastinal edema, without well-defined middle mediastinal adenopathy. No hilar adenopathy. A prevascular node measures 8 mm on image 58/series 2 versus 7 mm previously.  Lungs/Pleura: Moderate right-sided pleural effusion is increased since prior CT. Residual mild loculation anteriorly and inferiorly. Mass-effect upon right-sided airways is increased, including near complete obstruction of the right middle lobe bronchus on image 87/series 4.  Underlying centrilobular emphysema. 3 mm subpleural right upper lobe pulmonary nodule is similar on image 65/series 4.  A left upper lobe pulmonary nodule measures 6 mm on image 48/series 4 and is enlarged from the 2 to 3 mm on image 48/series 4 of the prior.  A 2 mm left upper lobe nodule on image 75/series 4 is likely present on the prior.  Tiny left upper lobe nodules on image 56/series 4 and image 33/series 4 are not readily apparent previously.  Medial right middle lobe and right lower lobe consolidation again identified. This is slightly increased, especially at the lateral right lung base. No well-defined residual or recurrent mass within.  Upper Abdomen: Mild cirrhosis. Normal imaged portions of the spleen, stomach, pancreas, gallbladder, adrenal glands, kidneys. Portal caval adenopathy measures 1.9 cm on image 164/series 2. Compare similar to the prior exam (when remeasured). Aortocaval node of 1.2 cm on image 177/series 2 was similar on 06/17/2016, favoring a reactive etiology.  Musculoskeletal: No acute osseous abnormality.  IMPRESSION: 1. Progression of medial right lung base consolidation which  is presumably radiation induced. No typical findings to suggest immunotherapy induced pneumonitis. 2. Increase in moderate right-sided pleural effusion. 3. Developing left supraclavicular and subpectoral adenopathy, suspicious for nodal metastasis. 4. Similarly, pulmonary nodules which are likely new and enlarged, suspicious for pulmonary metastasis. 5. Mildly enlarged prevascular node warrants followup attention. 6. Cirrhosis with upper abdominal, likely reactive adenopathy. 7.  Emphysema (ICD10-J43.9).   Electronically Signed   By: Abigail Miyamoto M.D.   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 Completed radiation therapy with Dr. Tammi Klippel in [place] at [facility].  Now on maintenance/consolidation Darvalumab every 2 weeks, beginning on 12/02/16. -Absent breath sounds on the right on physical exam today. With progressive shortness of breath and cough, will hold Imfinzi therapy today and obtain STAT CT chest to rule out immunotherapy pneumonitis vs worsening pleural effusion.    Addendum:  -Unfortunately, patient's CT chest performed today is concerning for progression of disease, as well as worsening (R) pleural effusion.  The pleural effusion is the likely reason for his worsening shortness of breath and cough. Reviewed these results in great detail with the patient, and in the presence of Forest Gleason, Therapist, sports.   -Given then he has a supraclavicular and sub-pectoral lymph node that are  concerning for metastatic disease, will obtain complete restaging with CT abd/pelvis with contrast; orders placed today.  Will also obtain MRI brain given his reported dizziness and N&V; orders placed today as well.  -[facility] to  request Foundation One on patient's previous path. This will not change our current treatment plan, but may be helpful in the future.   -Discussed patient's case in detail with Dr. Talbert Cage. She recommended we start therapy with Tecentriq. He was given some patient education in writing with review of possible side effects of this therapy.  We will plan to get him started in ~2 weeks after restaging imaging is completed.  -To try to help him symptomatically in the short-term, discussed therapeutic thoracentesis of (R) lung. Discussed with him the associated risks with thoracentesis, including pneumothorax. He agrees to proceed with procedure.  Orders placed for procedure and will ask that they send fluid for cytology to evaluate if pleural fluid is malignant.       Dispo:  -Discontinue Imfinzi.  -CT abd/pelvis and MRI brain to complete restaging.  -Ultrasound-guided thoracentesis as soon as able for symptomatic relief and pleural fluid cytology.  -Foundation One ordered on original path from 2017.  -Return to cancer center in 2 weeks to review results and start Tecentriq therapy.      All questions were answered to patient's stated satisfaction. Encouraged patient to call with any new concerns or questions before his next visit to the cancer center and we can certain see him sooner, if needed.    Plan of care discussed with Dr. Talbert Cage, who agrees with the above aforementioned.   A total of 40 minutes was spent in face-to-face care of this patient, with greater than 50% of that time spent in counseling and care-coordination.     Orders placed this encounter:  Orders Placed This Encounter  Procedures  . CT Chest W Contrast  . CT Abdomen Pelvis W Contrast  . MR Brain W Wo Contrast  . US THORACENTESIS ASP PLEURAL SPACE W/IMG GUIDE      Mike Craze, NP [place] [phone]

## 2017-07-14 NOTE — Patient Instructions (Signed)
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  You are going to be treated with tecentriq.  This is given every 3 weeks.  The first infusion will take 1 hour.  Then the subsequent infusions take 30 minutes to infuse.  This treatment is with palliative intent, which means that you are treatable but not curable. You will see the doctor regularly throughout treatment.  We monitor your lab work prior to every treatment.  The doctor monitors your response to treatment by the way you are feeling, your blood work, and scans periodically.  You will have wait times while you are here for treatment.  It will take about 30 minutes to 1 hour for lab work to result.  Then there will be wait time for pharmacy to mix your medications.     POTENTIAL SIDE EFFECTS OF TREATMENT:  Atezolizumab Gildardo Pounds)  About This Drug Huey Bienenstock is used to treat cancer. It is given by the vein (IV).  Possible Side Effects . Tiredness . Decreased appetite (decreased hunger) . Nausea . Constipation (not able to move bowels) . Loose bowel movements (diarrhea) . Urinary tract infection. Symptoms may include: . Pain or burning when you pass urine. . Feeling like you have to pass urine often, but not much comes out when you do. . Tender or heavy feeling in your lower abdomen. . Cloudy urine and/or urine that smells bad. . Pain on one side of your back under your ribs. This is where your kidneys are. . Fever, chills, nausea and/or throwing up. . Fever . Cough and/or trouble breathing . Muscle, bone and joint pain Note: Each of the side effects above was reported in 20% or greater of patients treated with atezolizumab. Not all possible side effects are included above.  Warnings and Precautions . This drug works with your immune system and can cause inflammation in any of your organs and tissues and can change how they work. This  may put you at risk for developing serious medical problems which can very rarely be fatal. . Inflammation (swelling) of the lungs which can very rarely be fatal. You may have a dry cough or trouble breathing. . Changes in your liver function. . Colitis. This is swelling (inflammation) in the colon - symptoms are loose bowel movements (diarrhea) stomach cramping, and sometimes blood in the bowel movements . Changes in your central nervous system can happen. The central nervous system is made up of your brain and spinal cord. You could feel extreme tiredness, agitation, confusion, hallucinations (see or hear things that are not there), trouble understanding or speaking, loss of control of your bowels or bladder, eyesight changes, numbness or lack of strength to your arms, legs, face, or body, and coma. If you start to have any of these symptoms let your doctor know right away. . This drug may affect some of your hormone glands (especially the thyroid, adrenals, pituitary and pancreas). . Blood sugar levels may change and you may develop diabetes. If you already have diabetes, changes may need to be made to your diabetes medication. . Inflammation of your pancreas. . Inflammation of your eyes and/or other changes in eyesight . Severe infections, including viral, bacterial and fungal, which can very rarely be fatal . While you are getting this drug in your vein (IV), you may have a reaction to the drug. Sometimes you may be given medication to stop or lessen these side  effects. Your nurse will check you closely for these signs: fever or shaking chills, flushing, facial swelling, feeling dizzy, headache, trouble breathing, rash, itching, chest tightness, or chest pain. These reactions may happen after your infusion. If this happens, call 911 for emergency care.  Important Information . This drug may be present in the saliva, tears, sweat, urine, stool, vomit, semen, and vaginal secretions.  Talk to your doctor and/or your nurse about the necessary precautions to take during this time.  Treating Side Effects . Drink plenty of fluids (a minimum of eight glasses per day is recommended). . To help with decreased appetite, eat small, frequent meals . Eat high caloric food such as pudding, ice cream, yogurt and milkshakes. . Ask your doctor or nurse about medicine that is available to help stop or lessen the loose bowel movements, nausea and/or constipation. . If you are not able to move your bowels, check with your doctor or nurse before you use enemas, laxatives, or suppositories . If you throw up or have loose bowel movements, you should drink more fluids so that you do not become dehydrated (lack water in the body from losing too much fluid). . To help with nausea and vomiting, eat small, frequent meals instead of three large meals a day. Choose foods and drinks that are at room temperature. Ask your nurse or doctor about other helpful tips and medicine that is available to help or stop lessen these symptoms. . If you get diarrhea, eat low-fiber foods that are high in protein and calories and avoid foods that can irritate your digestive tracts or lead to cramping. . Manage tiredness by pacing your activities for the day. Be sure to include periods of rest between energy-draining activities . If you're diabetic, keep good control of your blood sugar level. Tell your nurse or your doctor if your glucose levels are higher or lower than normal . Keeping your pain under control is important to your well-being. Please tell your doctor or nurse if you are experiencing pain. . Infusion reactions may happen for 24 hours after your infusion. If this happens, call 911 for emergency care.  Food and Drug Interactions . There are no known interactions of atezolizumab with food. . This drug may interact with other medicines. Tell your doctor and pharmacist about all the medicines and  dietary supplements (vitamins, minerals, herbs and others) that you are taking at this time. The safety and use of dietary supplements and alternative diets are often not known. Using these might affect your cancer or interfere with your treatment. Until more is known, you should not use dietary supplements or alternative diets without your cancer doctor's help.  When to Call the Doctor Call your doctor or nurse if you have any of these symptoms and/or any new or unusual symptoms: . Fever of 100.5 F (38 C) or higher . Chills . Pain in your chest . Dry cough . Trouble breathing . Confusion and/or agitation . Hallucinations . Trouble understanding or speaking . Blurry vision or changes in your eyesight . Numbness or lack of strength to your arms, legs, face, or body . Blurred vision or other changes in eyesight . Loose bowel movements (diarrhea) 4 times or loose bowel movements with lack of strength or a feeling of being dizzy . Pain in your abdomen that does not go away . Blood in your stool . No bowel movement in 3 days or when you feel uncomfortable . Nausea that stops you from eating or drinking  and/or is not relieved by prescribed medicines . Throwing up more than 3 times a day . Lasting loss of appetite or rapid weight loss of five pounds in a week . Pain or burning when you pass urine. . Difficulty urinating . Feeling like you have to pass urine often, but not much comes out when you do. . Tender or heavy feeling in your lower abdomen. . Cloudy urine and/or urine that smells bad. . Pain on one side of your back under your ribs. This is where your kidneys are. . Abnormal blood sugar . Unusual thirst, passing urine often, headache, sweating, shakiness, irritability . Pain that does not go away, or is not relieved by prescribed medicines . Fatigue that interferes with your daily activities . Signs of infusion reaction: fever or shaking chills, flushing, facial swelling, feeling  dizzy, headache, trouble breathing, rash, itching, chest tightness, or chest pain. . Signs of possible liver problems: dark urine, pale bowel movements, bad stomach pain, feeling very tired and weak, unusual itching, or yellowing of the eyes or skin . If you think you may be pregnant  Reproduction Warnings . Pregnancy warning: This drug can have harmful effects on the unborn baby. Women of child bearing potential should use effective methods of birth control during your cancer treatment and for at least 5 months after treatment. Let your doctor know right away if you think you may be pregnant. . Breastfeeding warning: It is not known if this drug passes into breast milk. For this reason, Women should not breast feed during treatment and for at least 5 months after treatment because this drug could enter the breast milk and cause harm to a breast feeding baby. . Fertility warning: In women, this drug may affect your ability to have children in the future. Talk with your doctor or nurse if you plan to have children. Ask for information on egg banking.    SELF CARE ACTIVITIES WHILE ON CHEMOTHERAPY: Hydration Increase your fluid intake 48 hours prior to treatment and drink at least 8 to 12 cups (64 ounces) of water/decaff beverages per day after treatment. You can still have your cup of coffee or soda but these beverages do not count as part of your 8 to 12 cups that you need to drink daily. No alcohol intake.  Medications Continue taking your normal prescription medication as prescribed.  If you start any new herbal or new supplements please let us know first to make sure it is safe.  Mouth Care Have teeth cleaned professionally before starting treatment. Keep dentures and partial plates clean. Use soft toothbrush and do not use mouthwashes that contain alcohol. Biotene is a good mouthwash that is available at most pharmacies or may be ordered by calling [phone]. Use warm salt water  gargles (1 teaspoon salt per 1 quart warm water) before and after meals and at bedtime. Or you may rinse with 2 tablespoons of three-percent hydrogen peroxide mixed in eight ounces of water. If you are still having problems with your mouth or sores in your mouth please call the clinic. If you need dental work, please let the doctor know before you go for your appointment so that we can coordinate the best possible time for you in regards to your chemo regimen. You need to also let your dentist know that you are actively taking chemo. We may need to do labs prior to your dental appointment.   Skin Care Always use sunscreen that has not expired and with SPF (  Sun Protection Factor) of 50 or higher. Wear hats to protect your head from the sun. Remember to use sunscreen on your hands, ears, face, & feet.  Use good moisturizing lotions such as udder cream, eucerin, or even Vaseline. Some chemotherapies can cause dry skin, color changes in your skin and nails.    . Avoid long, hot showers or baths. . Use gentle, fragrance-free soaps and laundry detergent. . Use moisturizers, preferably creams or ointments rather than lotions because the thicker consistency is better at preventing skin dehydration. Apply the cream or ointment within 15 minutes of showering. Reapply moisturizer at night, and moisturize your hands every time after you wash them.  Hair Loss (if your doctor says your hair will fall out)  . If your doctor says that your hair is likely to fall out, decide before you begin chemo whether you want to wear a wig. You may want to shop before treatment to match your hair color. . Hats, turbans, and scarves can also camouflage hair loss, although some people prefer to leave their heads uncovered. If you go bare-headed outdoors, be sure to use sunscreen on your scalp. . Cut your hair short. It eases the inconvenience of shedding lots of hair, but it also can reduce the emotional impact of watching your  hair fall out. . Don't perm or color your hair during chemotherapy. Those chemical treatments are already damaging to hair and can enhance hair loss. Once your chemo treatments are done and your hair has grown back, it's OK to resume dyeing or perming hair. With chemotherapy, hair loss is almost always temporary. But when it grows back, it may be a different color or texture. In older adults who still had hair color before chemotherapy, the new growth may be completely gray.  Often, new hair is very fine and soft.  Infection Prevention Please wash your hands for at least 30 seconds using warm soapy water. Handwashing is the #1 way to prevent the spread of germs. Stay away from sick people or people who are getting over a cold. If you develop respiratory systems such as green/yellow mucus production or productive cough or persistent cough let us know and we will see if you need an antibiotic. It is a good idea to keep a pair of gloves on when going into grocery stores/[employer] to decrease your risk of coming into contact with germs on the carts, etc. Carry alcohol hand gel with you at all times and use it frequently if out in public. If your temperature reaches 100.5 or higher please call the clinic and let us know.  If it is after hours or on the weekend please go to the ER if your temperature is over 100.5.  Please have your own personal thermometer at home to use.    Sex and bodily fluids If you are going to have sex, a condom must be used to protect the person that isn't taking chemotherapy. Chemo can decrease your libido (sex drive). For a few days after chemotherapy, chemotherapy can be excreted through your bodily fluids.  When using the toilet please close the lid and flush the toilet twice.  Do this for a few day after you have had chemotherapy.    Effects of chemotherapy on your sex life Some changes are simple and won't last long. They won't affect your sex life permanently. Sometimes you may  feel: . too tired . not strong enough to be very active . sick or sore  . not  in the mood . anxious or low Your anxiety might not seem related to sex. For example, you may be worried about the cancer and how your treatment is going. Or you may be worried about money, or about how you family are coping with your illness. These things can cause stress, which can affect your interest in sex. It's important to talk to your partner about how you feel. Remember - the changes to your sex life don't usually last long. There's usually no medical reason to stop having sex during chemo. The drugs won't have any long term physical effects on your performance or enjoyment of sex. Cancer can't be passed on to your partner during sex  Contraception It's important to use reliable contraception during treatment. Avoid getting pregnant while you or your partner are having chemotherapy. This is because the drugs may harm the baby. Sometimes chemotherapy drugs can leave a man or woman infertile.  This means you would not be able to have children in the future. You might want to talk to someone about permanent infertility. It can be very difficult to learn that you may no longer be able to have children. Some people find counselling helpful. There might be ways to preserve your fertility, although this is easier for men than for women. You may want to speak to a fertility expert. You can talk about sperm banking or harvesting your eggs. You can also ask about other fertility options, such as donor eggs. If you have or have had breast cancer, your doctor might advise you not to take the contraceptive pill. This is because the hormones in it might affect the cancer.  It is not known for sure whether or not chemotherapy drugs can be passed on through semen or secretions from the vagina. Because of this some doctors advise people to use a barrier method if you have sex during treatment. This applies to vaginal, anal or oral  sex. Generally, doctors advise a barrier method only for the time you are actually having the treatment and for about a week after your treatment. Advice like this can be worrying, but this does not mean that you have to avoid being intimate with your partner. You can still have close contact with your partner and continue to enjoy sex.  Animals If you have cats or birds we just ask that you not change the litter or change the cage.  Please have someone else do this for you while you are on chemotherapy.   Food Safety During and After Cancer Treatment Food safety is important for people both during and after cancer treatment. Cancer and cancer treatments, such as chemotherapy, radiation therapy, and stem cell/bone marrow transplantation, often weaken the immune system. This makes it harder for your body to protect itself from foodborne illness, also called food poisoning. Foodborne illness is caused by eating food that contains harmful bacteria, parasites, or viruses.  Foods to avoid Some foods have a higher risk of becoming tainted with bacteria. These include: [name] Unwashed fresh fruit and vegetables, especially leafy vegetables that can hide dirt and other contaminants . Raw sprouts, such as alfalfa sprouts . Raw or undercooked beef, especially ground beef, or other raw or undercooked meat and poultry . Fatty, fried, or spicy foods immediately before or after treatment.  These can sit heavy on your stomach and make you feel nauseous. . Raw or undercooked shellfish, such as oysters. . Sushi and sashimi, which often contain raw fish.  . Unpasteurized beverages, such  as unpasteurized fruit juices, raw milk, raw yogurt, or cider . Undercooked eggs, such as soft boiled, over easy, and poached; raw, unpasteurized eggs; or foods made with raw egg, such as homemade raw cookie dough and homemade mayonnaise Simple steps for food safety Shop smart. . Do not buy food stored or displayed in an unclean  area. . Do not buy bruised or damaged fruits or vegetables. . Do not buy cans that have cracks, dents, or bulges. . Pick up foods that can spoil at the end of your shopping trip and store them in a cooler on the way home. Prepare and clean up foods carefully. . Rinse all fresh fruits and vegetables under running water, and dry them with a clean towel or paper towel. . Clean the top of cans before opening them. . After preparing food, wash your hands for 20 seconds with hot water and soap. Pay special attention to areas between fingers and under nails. . Clean your utensils and dishes with hot water and soap. [name] Disinfect your kitchen and cutting boards using 1 teaspoon of liquid, unscented bleach mixed into 1 quart of water.   Dispose of old food. . Eat canned and packaged food before its expiration date (the "use by" or "best before" date). . Consume refrigerated leftovers within 3 to 4 days. After that time, throw out the food. Even if the food does not smell or look spoiled, it still may be unsafe. Some bacteria, such as Listeria, can grow even on foods stored in the refrigerator if they are kept for too long. Take precautions when eating out. . At restaurants, avoid buffets and salad bars where food sits out for a long time and comes in contact with many people. Food can become contaminated when someone with a virus, often a norovirus, or another "bug" handles it. . Put any leftover food in a "to-go" container yourself, rather than having the server do it. And, refrigerate leftovers as soon as you get home. . Choose restaurants that are clean and that are willing to prepare your food as you order it cooked.    MEDICATIONS:                                                                                                                                                              Zofran/Ondansetron 8mg  tablet. Take 1 tablet every 8 hours as needed for nausea/vomiting. (#1 nausea med to take,  this can constipate)  Compazine/Prochlorperazine 10mg  tablet. Take 1 tablet every 6 hours as needed for nausea/vomiting. (#2 nausea med to take, this can make you sleepy)   EMLA cream. Apply a quarter size amount to port site 1 hour prior to chemo. Do not rub in. Cover with plastic wrap.   Over-the-Counter Meds:  Miralax 1 capful  in 8 oz of fluid daily. May increase to two times a day if needed. This is a stool softener. If this doesn't work proceed you can add:  Senokot S-start with 1 tablet two times a day and increase to 4 tablets two times a day if needed. (total of 8 tablets in a 24 hour period). This is a stimulant laxative.   Call us if this does not help your bowels move.   Imodium 2mg  capsule. Take 2 capsules after the 1st loose stool and then 1 capsule every 2 hours until you go a total of 12 hours without having a loose stool. Call the [facility] if loose stools continue. If diarrhea occurs @ bedtime, take 2 capsules @ bedtime. Then take 2 capsules every 4 hours until morning. Call [facility].     Diarrhea Sheet  If you are having loose stools/diarrhea, please purchase Imodium and begin taking as outlined:  At the first sign of poorly formed or loose stools you should begin taking Imodium(loperamide) 2 mg capsules.  Take two caplets (4mg ) followed by one caplet (2mg ) every 2 hours until you have had no diarrhea for 12 hours.  During the night take two caplets (4mg ) at bedtime and continue every 4 hours during the night until the morning.  Stop taking Imodium only after there is no sign of diarrhea for 12 hours.    Always call the [facility] if you are having loose stools/diarrhea that you can't get under control.  Loose stools/disrrhea leads to dehydration (loss of water) in your body.  We have other options of trying to get the loose stools/diarrhea to stopped but you must let us know!     Constipation Sheet *Miralax in 8 oz of fluid daily.  May increase to two  times a day if needed.  This is a stool softener.  If this not enough to keep your bowel regular:  You can add:  *Senokot S, start with one tablet twice a day and can increase to 4 tablets twice a day if needed.  This is a stimulant laxative.   Sometimes when you take pain medication you need BOTH a medicine to keep your stool soft and a medicine to help your bowel push it out!  Please call if the above does not work for you.   Do not go more than 2 days without a bowel movement.  It is very important that you do not become constipated.  It will make you feel sick to your stomach (nausea) and can cause abdominal pain and vomiting.     Nausea Sheet  Zofran/Ondansetron 8mg  tablet. Take 1 tablet every 8 hours as needed for nausea/vomiting. (#1 nausea med to take, this can constipate)  Compazine/Prochlorperazine 10mg  tablet. Take 1 tablet every 6 hours as needed for nausea/vomiting. (#2 nausea med to take, this can make you sleepy)  You can take these medications together or separately.  We would first like for you to try the Ondansetron by itself and then take the Prochloperizine if needed. But you are allowed to take both medications at the same time if your nausea is that severe.  If you are having persistent nausea (nausea that does not stop) please take these medications on a staggered schedule so that the nausea medication stays in your body.  Please call the [facility] and let us know the amount of nausea that you are experiencing.  If you begin to vomit, you need to call the [facility] and  if it is the weekend and you have vomited more than one time and cant get it to stop-go to the Emergency Room.  Persistent nausea/vomiting can lead to dehydration (loss of fluid in your body) and will make you feel terrible.   Ice chips, sips of clear liquids, foods that are @ room temperature, crackers, and toast tend to be better tolerated.     SYMPTOMS TO REPORT AS SOON AS POSSIBLE AFTER  TREATMENT:  FEVER GREATER THAN 100.5 F  CHILLS WITH OR WITHOUT FEVER  NAUSEA AND VOMITING THAT IS NOT CONTROLLED WITH YOUR NAUSEA MEDICATION  UNUSUAL SHORTNESS OF BREATH  UNUSUAL BRUISING OR BLEEDING  TENDERNESS IN MOUTH AND THROAT WITH OR WITHOUT PRESENCE OF ULCERS  URINARY PROBLEMS  BOWEL PROBLEMS  UNUSUAL RASH    Wear comfortable clothing and clothing appropriate for easy access to any Portacath or PICC line. Let us know if there is anything that we can do to make your therapy better!    What to do if you need assistance after hours or on the weekends: CALL [phone].  HOLD on the line, do not hang up.  You will hear multiple messages but at the end you will be connected with a nurse triage line.  They will contact the doctor if necessary.  Most of the time they will be able to assist you.  Do not call the hospital operator.       I have been informed and understand all of the instructions given to me and have received a copy. I have been instructed to call the clinic [phone] or my family physician as soon as possible for continued medical care, if indicated. I do not have any more questions at this time but understand that I may call the [facility] or the Patient Navigator at [phone] during office hours should I have questions or need assistance in obtaining follow-up care.

## 2017-07-14 NOTE — Progress Notes (Signed)
Chemotherapy teaching pulled together. 

## 2017-07-14 NOTE — Patient Instructions (Signed)
[facility] at [facility] Discharge Instructions  RECOMMENDATIONS MADE BY THE CONSULTANT AND ANY TEST RESULTS WILL BE SENT TO YOUR REFERRING PHYSICIAN.  You saw Erik Dawson, NP, today See [name] at checkout for appointments.   Thank you for choosing  [facility] at [facility] to provide your oncology and hematology care.  To afford each patient quality time with our provider, please arrive at least 15 minutes before your scheduled appointment time.    If you have a lab appointment with the Cancer Center please come in thru the  Main Entrance and check in at the main information desk  You need to re-schedule your appointment should you arrive 10 or more minutes late.  We strive to give you quality time with our providers, and arriving late affects you and other patients whose appointments are after yours.  Also, if you no show three or more times for appointments you may be dismissed from the clinic at the providers discretion.     Again, thank you for choosing [facility].  Our hope is that these requests will decrease the amount of time that you wait before being seen by our physicians.       _____________________________________________________________  Should you have questions after your visit to [facility], please contact our office at [phone] between the hours of 8:30 a.m. and 4:30 p.m.  Voicemails left after 4:30 p.m. will not be returned until the following business day.  For prescription refill requests, have your pharmacy contact our office.       Resources For Cancer Patients and their Caregivers ? American Cancer Society: Can assist with transportation, wigs, general needs, runs Look Good Feel Better.        1-888-227-6333 ? Cancer Care: Provides financial assistance, online support groups, medication/co-pay assistance.  1-800-813-HOPE (4673) ? [facility] Assists  [place] cancer patients and their families through emotional , educational and financial support.  336-427-4357 ? [place] DSS Where to apply for food stamps, Medicaid and utility assistance. 336-342-1394 ? RCATS: Transportation to medical appointments. 336-347-2287 ? Social Security Administration: May apply for disability if have a Stage IV cancer. 336-342-7796 1-800-772-1213 ? [place] Aging, Disability and Transit Services: Assists with nutrition, care and transit needs. 336-349-2343  Cancer Center Support Programs: @10RELATIVEDAYS@ > Cancer Support Group  2nd Tuesday of the month 1pm-2pm, Journey Room  > Creative Journey  3rd Tuesday of the month 1130am-1pm, Journey Room  > Look Good Feel Better  1st Wednesday of the month 10am-12 noon, Journey Room (Call American Cancer [employer] to register 1-800-395-5775)    

## 2017-07-15 ENCOUNTER — Encounter (HOSPITAL_COMMUNITY): Payer: Self-pay

## 2017-07-15 ENCOUNTER — Ambulatory Visit (HOSPITAL_COMMUNITY)
Admission: RE | Admit: 2017-07-15 | Discharge: 2017-07-15 | Disposition: A | Discharge status: Home / Self Care | Payer: Medicare Other | Source: Ambulatory Visit | Attending: Diagnostic Radiology | Admitting: Diagnostic Radiology

## 2017-07-15 ENCOUNTER — Ambulatory Visit (HOSPITAL_COMMUNITY)
Admission: RE | Admit: 2017-07-15 | Discharge: 2017-07-15 | Disposition: A | Discharge status: Home / Self Care | Payer: Medicare Other | Source: Ambulatory Visit | Attending: Adult Health | Admitting: Adult Health

## 2017-07-15 ENCOUNTER — Telehealth (HOSPITAL_COMMUNITY): Payer: Self-pay | Admitting: Adult Health

## 2017-07-15 DIAGNOSIS — Z9889 Other specified postprocedural states: Secondary | ICD-10-CM | POA: Insufficient documentation

## 2017-07-15 DIAGNOSIS — C3491 Malignant neoplasm of unspecified part of right bronchus or lung: Secondary | ICD-10-CM

## 2017-07-15 DIAGNOSIS — R0602 Shortness of breath: Secondary | ICD-10-CM

## 2017-07-15 DIAGNOSIS — R059 Cough, unspecified: Secondary | ICD-10-CM

## 2017-07-15 DIAGNOSIS — J90 Pleural effusion, not elsewhere classified: Secondary | ICD-10-CM

## 2017-07-15 DIAGNOSIS — D72820 Lymphocytosis (symptomatic): Secondary | ICD-10-CM | POA: Insufficient documentation

## 2017-07-15 DIAGNOSIS — R05 Cough: Secondary | ICD-10-CM | POA: Diagnosis present

## 2017-07-15 NOTE — Progress Notes (Signed)
thoracentesis complete, no complaints, 1300 ml output from right lung, sent to lab.  Pt ambulatory to xray for post chest

## 2017-07-15 NOTE — Telephone Encounter (Signed)
Received a call from [facility] with [facility] to let me know that there is insufficient tissue to run [facility]. She is going to check with Dr. Lyndon Code re: other blocks from 2017 to see if there may be enough tissue.  They will let us know once she has talked with Dr. Lyndon Code.    Will discuss with Dr. Talbert Cage after we complete his restaging scans to determine full extent of disease. May be able to biopsy metastatic lesion, if needed.    Mike Craze, NP [place] [phone]

## 2017-07-15 NOTE — Discharge Instructions (Addendum)
Thoracentesis, Care After Refer to this sheet in the next few weeks. These instructions provide you with information about caring for yourself after your procedure. Your health care provider may also give you more specific instructions. Your treatment has been planned according to current medical practices, but problems sometimes occur. Call your health care provider if you have any problems or questions after your procedure. What can I expect after the procedure? After your procedure, it is common to have pain at the puncture site. Follow these instructions at home:  Take medicines only as directed by your health care provider.  You may return to your normal diet and normal activities as directed by your health care provider.  Drink enough fluid to keep your urine clear or pale yellow.  Do not take baths, swim, or use a hot tub until your health care provider approves.  Follow your health care provider's instructions about: ? Puncture site care. ? Bandage (dressing) changes and removal.  Check your puncture site every day for signs of infection. Watch for: ? Redness, swelling, or pain. ? Fluid, blood, or pus.  Keep all follow-up visits as directed by your health care provider. This is important. Contact a health care provider if:  You have redness, swelling, or pain at your puncture site.  You have fluid, blood, or pus coming from your puncture site.  You have a fever.  You have chills.  You have nausea or vomiting.  You have trouble breathing.  You develop a worsening cough. Get help right away if:  You have extreme shortness of breath.  You develop chest pain.  You faint or feel light-headed. This information is not intended to replace advice given to you by your health care provider. Make sure you discuss any questions you have with your health care provider. Document Released: 12/13/2014 Document Revised: 07/24/2016 Document Reviewed: 09/03/2014 Elsevier  Interactive Patient Education  [employer].

## 2017-07-15 NOTE — Procedures (Signed)
PreOperative Dx: SCCA , RT pleural effusion Postoperative Dx: SCCA, RT pleural effusion Procedure:   US guided RT thoracentesis Radiologist:  Thornton Papas Anesthesia:  10 ml of 1% lidocaine Specimen:  1.3 L of yellow colored fluid EBL:   < 1 ml Complications: None

## 2017-07-19 ENCOUNTER — Ambulatory Visit (HOSPITAL_COMMUNITY)
Admission: RE | Admit: 2017-07-19 | Discharge: 2017-07-19 | Disposition: A | Discharge status: Home / Self Care | Payer: Medicare Other | Source: Ambulatory Visit | Attending: Adult Health | Admitting: Adult Health

## 2017-07-19 DIAGNOSIS — C3491 Malignant neoplasm of unspecified part of right bronchus or lung: Secondary | ICD-10-CM | POA: Insufficient documentation

## 2017-07-19 DIAGNOSIS — J90 Pleural effusion, not elsewhere classified: Secondary | ICD-10-CM | POA: Insufficient documentation

## 2017-07-19 DIAGNOSIS — R932 Abnormal findings on diagnostic imaging of liver and biliary tract: Secondary | ICD-10-CM | POA: Diagnosis not present

## 2017-07-19 DIAGNOSIS — R590 Localized enlarged lymph nodes: Secondary | ICD-10-CM | POA: Diagnosis not present

## 2017-07-19 MED ORDER — IOPAMIDOL (ISOVUE-300) INJECTION 61%
100.0000 mL | Freq: Once | INTRAVENOUS | Status: AC | PRN
  Administered 2017-07-19: 100 mL via INTRAVENOUS

## 2017-07-20 ENCOUNTER — Other Ambulatory Visit (HOSPITAL_COMMUNITY): Payer: Self-pay | Admitting: Adult Health

## 2017-07-20 DIAGNOSIS — C3491 Malignant neoplasm of unspecified part of right bronchus or lung: Secondary | ICD-10-CM

## 2017-07-22 ENCOUNTER — Ambulatory Visit (HOSPITAL_COMMUNITY)
Admission: RE | Admit: 2017-07-22 | Discharge: 2017-07-22 | Disposition: A | Discharge status: Home / Self Care | Payer: Medicare Other | Source: Ambulatory Visit | Attending: Adult Health | Admitting: Adult Health

## 2017-07-22 DIAGNOSIS — R42 Dizziness and giddiness: Secondary | ICD-10-CM | POA: Insufficient documentation

## 2017-07-22 DIAGNOSIS — R112 Nausea with vomiting, unspecified: Secondary | ICD-10-CM | POA: Insufficient documentation

## 2017-07-22 DIAGNOSIS — C3491 Malignant neoplasm of unspecified part of right bronchus or lung: Secondary | ICD-10-CM | POA: Diagnosis present

## 2017-07-22 MED ORDER — GADOBENATE DIMEGLUMINE 529 MG/ML IV SOLN
20.0000 mL | Freq: Once | INTRAVENOUS | Status: AC | PRN
  Administered 2017-07-22: 20 mL via INTRAVENOUS

## 2017-07-28 ENCOUNTER — Encounter (HOSPITAL_COMMUNITY): Payer: Medicare Other

## 2017-07-28 ENCOUNTER — Encounter (HOSPITAL_BASED_OUTPATIENT_CLINIC_OR_DEPARTMENT_OTHER): Payer: Medicare Other | Admitting: Adult Health

## 2017-07-28 ENCOUNTER — Encounter (HOSPITAL_COMMUNITY): Payer: Self-pay | Admitting: Adult Health

## 2017-07-28 VITALS — BP 149/86 | HR 100 | Resp 18 | Ht 72.0 in | Wt 214.5 lb

## 2017-07-28 DIAGNOSIS — C3431 Malignant neoplasm of lower lobe, right bronchus or lung: Secondary | ICD-10-CM | POA: Diagnosis present

## 2017-07-28 DIAGNOSIS — C3491 Malignant neoplasm of unspecified part of right bronchus or lung: Secondary | ICD-10-CM

## 2017-07-28 DIAGNOSIS — J918 Pleural effusion in other conditions classified elsewhere: Secondary | ICD-10-CM | POA: Diagnosis not present

## 2017-07-28 DIAGNOSIS — F4323 Adjustment disorder with mixed anxiety and depressed mood: Secondary | ICD-10-CM

## 2017-07-28 DIAGNOSIS — C799 Secondary malignant neoplasm of unspecified site: Secondary | ICD-10-CM | POA: Diagnosis not present

## 2017-07-28 NOTE — Progress Notes (Signed)
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  Patient Characteristics: Stage IV Metastatic, Squamous, PS = 0, 1, Second Line, Prior PD-1/PD-L1 Inhibitor and Immunotherapy Candidate AJCC T Category: T2a Current Disease Status: Distant Metastases AJCC N Category: N2 AJCC M Category: M1a AJCC 8 Stage Grouping: IVA Histology: Squamous Cell Line of therapy: Second Line PD-L1 Expression Status: Awaiting Test Results Performance Status: PS = 0, 1 Would you be surprised if this patient died  in the next year? I would be surprised if this patient died in the next year Immunotherapy Candidate Status: Candidate for Immunotherapy Prior Immunotherapy Status: Prior PD-1/PD-L1 Inhibitor

## 2017-07-28 NOTE — Patient Instructions (Signed)
[place] at [facility] Discharge Instructions  RECOMMENDATIONS MADE BY THE CONSULTANT AND ANY TEST RESULTS WILL BE SENT TO YOUR REFERRING PHYSICIAN.  YOU WERE SEEN TODAY BY Mike Craze NP.   Thank you for choosing [place] at [facility] to provide your oncology and hematology care.  To afford each patient quality time with our provider, please arrive at least 15 minutes before your scheduled appointment time.    If you have a lab appointment with the [facility] please come in thru the  Main Entrance and check in at the main information desk  You need to re-schedule your appointment should you arrive 10 or more minutes late.  We strive to give you quality time with our providers, and arriving late affects you and other patients whose appointments are after yours.  Also, if you no show three or more times for appointments you may be dismissed from the clinic at the providers discretion.     Again, thank you for choosing [facility].  Our hope is that these requests will decrease the amount of time that you wait before being seen by our physicians.       _____________________________________________________________  Should you have questions after your visit to [facility], please contact our office at [phone] between the hours of 8:30 a.m. and 4:30 p.m.  Voicemails left after 4:30 p.m. will not be returned until the following business day.  For prescription refill requests, have your pharmacy contact our office.       Resources For Cancer Patients and their Caregivers ? American Cancer Society: Can assist with transportation, wigs, general needs, runs Look Good Feel Better.        405-474-5175 ? Cancer Care: Provides financial assistance, online support groups, medication/co-pay assistance.  1-800-813-HOPE 334-061-5129) ? [facility] Assists [place] cancer patients and  their families through emotional , educational and financial support.  548-287-5586 ? [place] DSS Where to apply for food stamps, Medicaid and utility assistance. 802-112-9853 ? RCATS: Transportation to medical appointments. 262-463-3762 ? Social Security Administration: May apply for disability if have a Stage IV cancer. 586-860-9285 7065231457 ? [employer], Disability and Transit Services: Assists with nutrition, care and transit needs. [place] Support Programs: @10RELATIVEDAYS @ > Cancer Support Group  2nd Tuesday of the month 1pm-2pm, Journey Room  > Creative Journey  3rd Tuesday of the month 1130am-1pm, Journey Room  > Look Good Feel Better  1st Wednesday of the month 10am-12 noon, Journey Room (Call [place] to register [phone])

## 2017-07-28 NOTE — Progress Notes (Signed)
Not starting chemotherapy today per Mike Craze NP.

## 2017-07-28 NOTE — Progress Notes (Signed)
Erik [name],  [id number]   CLINIC:  Medical Oncology/Hematology  PCP:  [name], [name]  [address] [phone]   REASON FOR VISIT:  Follow-up for recently progressed, now Stage IV adenocarcinoma of lung    CURRENT THERAPY: Plans to start Tecentriq    BRIEF ONCOLOGIC HISTORY:    Adenocarcinoma of right lung ([place])   07/01/2016 PET scan    6.3 x 3.7 cm right lower lobe mass, suspicious for primary bronchogenic neoplasm, as described above. Hypermetabolic thoracic nodal metastases, as above. Additional right perihilar hypermetabolism, indeterminate. Associated right middle lobe atelectasis/collapse.      07/19/2016 Procedure    Bronchoscopy with brushings and biopsies and endobronchial ultrasound with mediastinal lymph node aspirations by Dr. Roxan Hockey      07/21/2016 Pathology Results    Lung, biopsy, Right Middle Lobe - LUNG TISSUE WITH SQUAMOUS METAPLASIA. - NO MALIGNANCY IDENTIFIED.      07/21/2016 Pathology Results    FINE NEEDLE ASPIRATION, ENDOSCOPIC (A) LEVEL 7 (SPECIMEN 1 OF 3, COLLECTED ON 07/19/16): MALIGNANT CELLS CONSISTENT WITH ADENOCARCINOMA.      07/21/2016 Pathology Results    FINE NEEDLE ASPIRATION, EBUS, 4R, B (SPECIMEN 2 OF 3, COLLECTED 07/19/16): MALIGNANT CELLS CONSISTENT WITH ADENOCARCINOMA.      07/21/2016 Pathology Results    FINE NEEDLE ASPIRATION, EBUS, BRUSHING, RIGHT MIDDLE LOBE, D (SPECIMEN 3 OF 3, COLLECTED 07/19/16): MALIGNANT CELLS CONSISTENT WITH ADENOCARCINOMA.      07/22/2016 Imaging    MRI brain- No evidence of intracranial metastases.      08/17/2016 - 09/20/2016 Chemotherapy    The patient had palonosetron (ALOXI) injection 0.25 mg, 0.25 mg, Intravenous,  Once, 1 of 1 cycle  CISplatin (PLATINOL) 123 mg in sodium chloride 0.9 % 500 mL chemo infusion, 50 mg/m2 = 123 mg, Intravenous,  Once, 1 of 1 cycle  etoposide (VEPESID) 120 mg in sodium chloride 0.9 %  500 mL chemo infusion, 50 mg/m2 = 120 mg, Intravenous,  Once, 1 of 1 cycle  fosaprepitant (EMEND) 150 mg, dexamethasone (DECADRON) 12 mg in sodium chloride 0.9 % 145 mL IVPB, , Intravenous,  Once, 1 of 1 cycle  ondansetron (ZOFRAN) 8 mg in sodium chloride 0.9 % 50 mL IVPB, , Intravenous,  Once, 2 of 6 cycles  for chemotherapy treatment.        08/17/2016 -  Radiation Therapy         11/15/2016 Imaging    CT CAP- Interval response to therapy. The previously demonstrated mediastinal and right hilar adenopathy and resulting right middle lobe atelectasis have all improved. 2. No discrete residual lung masses are identified. There is new multifocal ground-glass opacity within the right lower lobe, and to a lesser extent in the left upper lobe. These are probably inflammatory/treatment related. 3. No evidence of abdominopelvic metastatic disease. Stable prominent lymph nodes in the upper abdomen, likely reactive. 4. Decompressed mid SVC without specific signs of SVC occlusion.      12/02/2016 -  Chemotherapy    Imfinzi (durvalumab) immunotherapy every 2 weeks x up to 1 year      01/03/2017 Procedure    EGD by Dr. [name], colonoscopy aborted as "patient forgot to take other half of preparation). Esophagitis. likely radiation-induced ?Dilated.  Erythematous mucosa in the stomach. Biopsied. Normal duodenal bulb and second portion of the duodenum.      02/14/2017 Imaging    CT chest- 1. Development of a small to moderate right-sided pleural  effusion with minimal loculation anteriorly. 2. Worsened right-sided aeration with right middle and lower lobe consolidation. As this has a geographic distribution, this could be radiation induced or represent infection. Depending on clinical concern of progressive disease, thoracentesis and/or PET may be informative. 3. Development of mild right paratracheal adenopathy, most likely reactive. Recommend attention on follow-up. 4. Subtle findings which  are highly suspicious for mild cirrhosis. Upper abdominal adenopathy is similar and likely reactive. 5. Persistent right lower and improved left upper lobe ground-glass opacities are favored to be infectious or inflammatory.      06/01/2017 Imaging    CT chest  IMPRESSION: 1. Evolving radiation changes in the medial right hemithorax. 2. Stable moderate size right pleural effusion without definite nodular components. 3. No signs of local recurrence or progressive metastatic disease. No residual enlarged thoracic lymph nodes. 4. Stable appearance of the visualized upper abdomen with probable cirrhosis and reactive adenopathy in the porta hepatis.      07/15/2017 Procedure    Therapeutic thoracentesis performed today; 1.3 L removed.  Fluid sent for cytology.       07/15/2017 Pathology Results    Pleural fluid NEGATIVE for malignancy by cytology.         HISTORY OF PRESENT ILLNESS:  (From [name], PA-C's last note on 01/27/17)       INTERVAL HISTORY:  Mr. Wynn 56 y.o. male returns for follow-up for lung cancer.   Due today for first dose of Tecentriq. However, he wants to know if he can postpone his treatment. "I just don't feel good today. I feel yucky."   It is difficult for him to give me any specific symptoms that are bothering him, other than his chronic back pain. States that he has felt depressed lately; states that he "gets ill" (mad) at times too.   His shortness of breath is better since thoracentesis. He still has intermittent productive cough. His chronic back and leg pain is bothering him more lately.    He wants to know if he can postpone his first dose of Tecentriq today, "but I don't want to do anything that will mess me up."     REVIEW OF SYSTEMS:  Review of Systems  Constitutional: Positive for appetite change and fatigue. Negative for chills and fever.  HENT:  Negative.   Eyes: Negative.   Respiratory: Positive for cough and shortness of breath.     Cardiovascular: Negative.  Negative for chest pain.  Gastrointestinal: Negative for abdominal pain, blood in stool, constipation, diarrhea, nausea and vomiting.  Endocrine: Negative.   Genitourinary: Negative.  Negative for dysuria and hematuria.   Musculoskeletal: Positive for arthralgias and back pain.  Skin: Negative for rash.  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 Negative for headaches.  Psychiatric/Behavioral: Positive for depression. The patient is nervous/anxious.      PAST MEDICAL/SURGICAL HISTORY:  Past Medical History:  Diagnosis Date  . Arnold-Chiari syndrome ([place])   . Arthritis   . Asthma   . Chronic back pain   . Depression   . GERD (gastroesophageal reflux disease)   . Hypertension   . Seasonal allergies   . Spinal stenosis of lumbar region   . 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 Wheezing    Past Surgical History:  Procedure Laterality Date  . BACK SURGERY     5 total  . BIOPSY  01/03/2017   Procedure: BIOPSY;  Surgeon: Daneil Dolin, MD;  [facility];  Service: Endoscopy;;  gastric  . COLONOSCOPY WITH PROPOFOL N/A 04/04/2017   Procedure: COLONOSCOPY WITH PROPOFOL;  Surgeon: Daneil Dolin, MD;  [facility];  Service: Endoscopy;  Laterality: N/A;  8:45am  . ESOPHAGOGASTRODUODENOSCOPY (EGD) WITH PROPOFOL N/A 01/03/2017   Procedure: ESOPHAGOGASTRODUODENOSCOPY (EGD) WITH PROPOFOL;  Surgeon: Daneil Dolin, MD;  [facility];  Service: Endoscopy;  Laterality: N/A;  . Venia Minks DILATION N/A 01/03/2017   Procedure: Venia Minks DILATION;  Surgeon: Daneil Dolin, MD;  [facility];  Service: Endoscopy;  Laterality: N/A;  . MULTIPLE EXTRACTIONS WITH ALVEOLOPLASTY N/A 07/08/2014   Procedure: MULTIPLE EXTRACION WITH ALVEOLOPLASTY with EXCISION LESION RIGHT SIDE OF TONGUE;  Surgeon: Gae Bon, DDS;  Location: [facility];  Service: Oral Surgery;  Laterality:  N/A;  . PORTACATH PLACEMENT Right 07/30/2016   Procedure: INSERTION PORT-A-CATH;  Surgeon: Vickie Epley, MD;  Location: [facility];  Service: Vascular;  Laterality: Right;  [name] VIDEO BRONCHOSCOPY WITH ENDOBRONCHIAL ULTRASOUND N/A 07/19/2016   Procedure: VIDEO BRONCHOSCOPY WITH ENDOBRONCHIAL ULTRASOUND;  Surgeon: [name], MD;  Location: [facility];  Service: Thoracic;  Laterality: N/A;     SOCIAL HISTORY:  Social History   Social History  . Marital status: Divorced    Spouse name: N/A  . Number of children: N/A  . Years of education: N/A   Occupational History  . Not on file.   Social History Main Topics  . Smoking status: Current Every Day Smoker    Packs/day: 0.25    Years: 38.00    Types: Cigarettes  . Smokeless tobacco: Former Systems developer    Types: Snuff     Comment: using chantix- smoking once in a while  . Alcohol use 1.2 oz/week    2 Cans of beer per week     Comment: reports drinking 0-2 beers a week  . Drug use: Yes    Frequency: 7.0 times per week    Types: Marijuana     Comment: daily for cancer  . Sexual activity: Not Currently   Other Topics Concern  . Not on file   Social History Narrative  . No narrative on file    FAMILY HISTORY:  Family History  Problem Relation Age of Onset  . Cancer Mother        breast cancer  . Heart failure Brother   . Colon cancer Neg Hx        not sure, ?grandfather and/or uncle    CURRENT MEDICATIONS:  Outpatient Encounter Prescriptions as of 07/28/2017  Medication Sig Note  . albuterol (PROVENTIL HFA;VENTOLIN HFA) 108 (90 BASE) MCG/ACT inhaler Inhale 2 puffs into the lungs every 6 (six) hours as needed for wheezing.    [name] ALPRAZolam (XANAX) 1 MG tablet Take 1 mg by mouth 2 (two) times daily as needed for anxiety or sleep (takes 1 tablet and bedtime for sleep and 1 dose during the day only if needed).    [name] amLODipine (NORVASC) 5 MG tablet Take 5 mg by mouth daily.    . Artificial Tear Solution (SOOTHE XP) SOLN Apply 2  drops to eye daily.   Huey Bienenstock (TECENTRIQ IV) Inject into the vein. Every 3 weeks   . budesonide-formoterol (SYMBICORT) 160-4.5 MCG/ACT inhaler Inhale 2 puffs into the lungs daily.    [name] Dextromethorphan-Guaifenesin (CORICIDIN HBP CONGESTION/COUGH PO) Take 1 tablet by mouth 2 (two) times daily as needed (congestion).   [name] escitalopram (LEXAPRO) 20 MG tablet Take 20 mg  by mouth daily.    [name] gabapentin (NEURONTIN) 300 MG capsule Take 600 mg by mouth 3 (three) times daily.    [name] guaiFENesin (MUCINEX) 600 MG 12 hr tablet Take 600 mg by mouth 2 (two) times daily as needed (for congestion.).   [name] levofloxacin (LEVAQUIN) 500 MG tablet    . lidocaine (XYLOCAINE) 2 % solution Use as directed 20 mLs in the mouth or throat every 6 (six) hours as needed (for mouth pain (used prior to eating when needed)).   [name] lidocaine-prilocaine (EMLA) cream Apply a quarter size amount to port site 1 hour prior to chemo. Do not rub in. Cover with plastic wrap. (Patient taking differently: Apply 1 application topically See admin instructions. Apply a quarter size amount to port site 1 hour prior to chemo. Do not rub in. Cover with plastic wrap.)   . methadone (DOLOPHINE) 10 MG tablet Take 20 mg by mouth 5 (five) times daily. Takes 2 tablets 5 times daily   . Multiple Vitamin (MULTIVITAMIN WITH MINERALS) TABS tablet Take 1 tablet by mouth daily. Centrum   . Na Sulfate-K Sulfate-Mg Sulf (SUPREP BOWEL PREP KIT) 17.5-3.13-1.6 GM/180ML SOLN Take 1 kit by mouth as directed. (Patient taking differently: Take 1 kit by mouth as directed. ) 02/04/2017: Before colonoscopy.  . ondansetron (ZOFRAN) 8 MG tablet TAKE 1 TABLET BY MOUTH EVERY 8 HOURS AS NEEDED FOR NAUSEA AND VOMITING.   . pantoprazole (PROTONIX) 40 MG tablet TAKE (1) TABLET BY MOUTH TWICE DAILY.   [name] prochlorperazine (COMPAZINE) 10 MG tablet TAKE 1 TABLET EVERY 6 HOURS AS NEEDED FOR NAUSEA AND VOMITING.   [name] tiZANidine (ZANAFLEX) 4 MG tablet Take 4 mg by mouth every 8 (eight) hours  as needed for muscle spasms.  03/29/2017: Takes it twice daily and 3 rd dose only if needed for muscle spasms  . varenicline (CHANTIX) 1 MG tablet Take 1 mg by mouth 2 (two) times daily.    Facility-Administered Encounter Medications as of 07/28/2017  Medication  . 0.9 %  sodium chloride infusion  . [DISCONTINUED] iopamidol (ISOVUE-300) 61 % injection 75 mL    ALLERGIES:  Allergies  Allergen Reactions  . Demeclocycline Other (See Comments) and Swelling  . Tetracyclines & Related Swelling and Other (See Comments)          PHYSICAL EXAM:  ECOG Performance status: 1 - Symptomatic, but independent.   Vitals:   07/28/17 0800  BP: (!) 149/86  Pulse: 100  Resp: 18  SpO2: 97%   Filed Weights   07/28/17 0800  Weight: 214 lb 8 oz (97.3 kg)     Physical Exam  Constitutional: He is oriented to person, place, and time.  Chronically-ill appearing male in no acute distress   HENT:  Head: Normocephalic.  Mouth/Throat: Oropharynx is clear and moist. No oropharyngeal exudate.  Eyes: Pupils are equal, round, and reactive to light. Conjunctivae are normal. No scleral icterus.  Neck: Normal range of motion. Neck supple.  Cardiovascular: Normal rate and regular rhythm.   Pulmonary/Chest: Effort normal. No respiratory distress.  Mild rhonchi noted to RUL, LUL, & LLL.  RLL with diminished breath sounds.  Abdominal: Soft. Bowel sounds are normal. There is no tenderness. There is no rebound.  Musculoskeletal: Normal range of motion. He exhibits no edema.  Lymphadenopathy:    He has no cervical adenopathy.  Neurological: He is alert and oriented to person, place, and time. No cranial nerve deficit. Gait normal.  Skin: Skin is warm and dry. No rash noted.  Psychiatric: Mood, memory, affect and judgment normal.  Nursing note and vitals reviewed.    LABORATORY DATA:  I have reviewed the labs as listed.  CBC    Component Value Date/Time   WBC 6.3 07/14/2017 1036   RBC 4.65 07/14/2017  1036   HGB 14.8 07/14/2017 1036   HCT 44.8 07/14/2017 1036   PLT 166 07/14/2017 1036   MCV 96.3 07/14/2017 1036   MCH 31.8 07/14/2017 1036   MCHC 33.0 07/14/2017 1036   RDW 15.8 (H) 07/14/2017 1036   LYMPHSABS 1.2 07/14/2017 1036   MONOABS 0.5 07/14/2017 1036   EOSABS 0.1 07/14/2017 1036   BASOSABS 0.0 07/14/2017 1036   CMP Latest Ref Rng & Units 07/14/2017 06/30/2017 06/16/2017  Glucose 65 - 99 mg/dL 111(H) 144(H) 125(H)  BUN 6 - 20 mg/dL 8 10 9   Creatinine 0.61 - 1.24 mg/dL 0.71 0.78 0.97  Sodium 135 - 145 mmol/L 137 136 137  Potassium 3.5 - 5.1 mmol/L 4.0 3.7 4.1  Chloride 101 - 111 mmol/L 100(L) 101 97(L)  CO2 22 - 32 mmol/L 30 28 34(H)  Calcium 8.9 - 10.3 mg/dL 9.0 8.8(L) 8.6(L)  Total Protein 6.5 - 8.1 g/dL 7.1 7.5 7.1  Total Bilirubin 0.3 - 1.2 mg/dL 0.6 0.4 0.5  Alkaline Phos 38 - 126 U/L 139(H) 132(H) 141(H)  AST 15 - 41 U/L 24 23 25   ALT 17 - 63 U/L 18 17 18     PENDING LABS:    DIAGNOSTIC IMAGING:  Stat CT chest performed: 07/14/17 CLINICAL DATA:  Cough. Shortness of breath. Stage IIIA adenocarcinoma of right lung. evaluate for immunotherapy induced pneumonitis versus worsening pleural effusion.  EXAM: CT CHEST WITH CONTRAST  TECHNIQUE: Multidetector CT imaging of the chest was performed during intravenous contrast administration.  CONTRAST:  75 cc of Isovue-300  COMPARISON:  06/01/2017  FINDINGS: Cardiovascular: Right-sided Port-A-Cath terminates at the high right atrium. Normal aortic caliber. Normal heart size, without pericardial effusion. No central pulmonary embolism, on this non-dedicated study.  Mediastinum/Nodes: A deep left subpectoral node measures 1.6 cm on image 18/series 2 and is position just cephalad to the left subclavian artery. This is new. Posterior left supraclavicular node measures 12 mm on image 8/series 2. Incompletely imaged on the prior, but likely enlarged.  Radiation induced mediastinal edema, without well-defined  middle mediastinal adenopathy. No hilar adenopathy. A prevascular node measures 8 mm on image 58/series 2 versus 7 mm previously.  Lungs/Pleura: Moderate right-sided pleural effusion is increased since prior CT. Residual mild loculation anteriorly and inferiorly. Mass-effect upon right-sided airways is increased, including near complete obstruction of the right middle lobe bronchus on image 87/series 4.  Underlying centrilobular emphysema. 3 mm subpleural right upper lobe pulmonary nodule is similar on image 65/series 4.  A left upper lobe pulmonary nodule measures 6 mm on image 48/series 4 and is enlarged from the 2 to 3 mm on image 48/series 4 of the prior.  A 2 mm left upper lobe nodule on image 75/series 4 is likely present on the prior.  Tiny left upper lobe nodules on image 56/series 4 and image 33/series 4 are not readily apparent previously.  Medial right middle lobe and right lower lobe consolidation again identified. This is slightly increased, especially at the lateral right lung base. No well-defined residual or recurrent mass within.  Upper Abdomen: Mild cirrhosis. Normal imaged portions of the spleen, stomach, pancreas, gallbladder, adrenal glands, kidneys. Portal caval adenopathy measures 1.9 cm on image 164/series 2. Compare similar to the prior  exam (when remeasured). Aortocaval node of 1.2 cm on image 177/series 2 was similar on 06/17/2016, favoring a reactive etiology.  Musculoskeletal: No acute osseous abnormality.  IMPRESSION: 1. Progression of medial right lung base consolidation which is presumably radiation induced. No typical findings to suggest immunotherapy induced pneumonitis. 2. Increase in moderate right-sided pleural effusion. 3. Developing left supraclavicular and subpectoral adenopathy, suspicious for nodal metastasis. 4. Similarly, pulmonary nodules which are likely new and enlarged, suspicious for pulmonary metastasis. 5. Mildly  enlarged prevascular node warrants followup attention. 6. Cirrhosis with upper abdominal, likely reactive adenopathy. 7.  Emphysema (ICD10-J43.9).   Electronically Signed   By: Abigail Miyamoto M.D.   On: 07/14/2017 12:46   CT abd/pelvis: 07/19/17 CLINICAL DATA:  Recurrent right lung adenocarcinoma, presenting for restaging.  EXAM: CT ABDOMEN AND PELVIS WITH CONTRAST  TECHNIQUE: Multidetector CT imaging of the abdomen and pelvis was performed using the standard protocol following bolus administration of intravenous contrast.  CONTRAST:  132m ISOVUE-300 IOPAMIDOL (ISOVUE-300) INJECTION 61%  COMPARISON:  11/15/2016 CT abdomen/ pelvis.  07/14/2017 chest CT.  FINDINGS: Lower chest: Small to moderate loculated basilar right pleural effusion, slightly decreased since 07/14/2017. Superior approach central venous catheter tip is seen at the cavoatrial junction. Stable partially visualized sharply marginated consolidation, volume loss, distortion and bronchiectasis in the right lower lung, compatible with radiation fibrosis.  Hepatobiliary: Liver surface appears diffusely finely irregular with slight hypertrophy of the lateral segment left liver lobe, suggesting cirrhosis. There is a vague low-attenuation 2.5 x 2.2 cm focus in the posterior right liver dome (series 2/ image 16), not definitely seen on the prior CT abdomen study. No additional potential liver lesions. Normal gallbladder with no radiopaque cholelithiasis. No biliary ductal dilatation.  Pancreas: Normal, with no mass or duct dilation.  Spleen: Normal size. No mass.  Adrenals/Urinary Tract: Normal adrenals. Normal kidneys with no hydronephrosis and no renal mass. Normal bladder.  Stomach/Bowel: Grossly normal stomach. Normal caliber small bowel with no small bowel wall thickening. Normal appendix. Minimal sigmoid diverticulosis, with no large bowel wall thickening or pericolonic fat stranding. Oral  contrast reaches the rectum.  Vascular/Lymphatic: Normal caliber abdominal aorta. Patent portal, splenic and renal veins. Stable mildly enlarged 1.2 cm aortocaval node (series 2/ image 37). Stable mildly enlarged 1.8 cm portacaval node (series 2/ image 31). Stable mildly enlarged 1.3 cm porta hepatis node (series 2/ image 27). No new pathologically enlarged abdominopelvic nodes.  Reproductive: Normal size prostate with nonspecific coarse internal prostatic calcifications.  Other: No pneumoperitoneum, ascites or focal fluid collection.  Musculoskeletal: No aggressive appearing focal osseous lesions. Status post bilateral posterior spinal fusion from L3-S1, with no evidence of hardware fracture or loosening. Moderate thoracolumbar spondylosis.  IMPRESSION: 1. Morphologic changes in the liver suggestive of early cirrhosis. New vague low-attenuation focus in the posterior right liver dome, cannot exclude hepatocellular carcinoma or less likely liver metastasis. Recommend further evaluation with MRI abdomen without and with IV contrast. This recommendation follows ACR consensus guidelines: Managing Incidental Findings on Abdominal CT: White Paper of the ACR Incidental Findings Committee. J Am Coll Radiol 2010;7:754-773. 2. Stable mild upper retroperitoneal lymphadenopathy, compatible with benign reactive adenopathy. 3. Otherwise no potential abdominopelvic findings of metastatic disease. 4. Small to moderate loculated basilar right pleural effusion, slightly decreased.   Electronically Signed   By: JIlona SorrelM.D.   On: 07/20/2017 09:01   MRI brain: 07/22/17 CLINICAL DATA:  Adenocarcinoma lung.  Nausea and vomiting  EXAM: MRI HEAD WITHOUT AND WITH CONTRAST  TECHNIQUE: Multiplanar,  multiecho pulse sequences of the brain and surrounding structures were obtained without and with intravenous contrast.  CONTRAST:  41m MULTIHANCE GADOBENATE DIMEGLUMINE 529 MG/ML  IV SOLN  COMPARISON:  MRI head 07/22/2016  FINDINGS: Brain: Image quality degraded by motion.  Ventricle size normal. Cerebral volume normal. Negative for acute infarct. Small nonenhancing white matter hyperintensities bilaterally are similar to the prior study and most consistent with chronic microvascular ischemia. Negative for hemorrhage or mass.  No enhancing mass lesion postcontrast. Small developmental venous anomaly in the right cerebellum. Leptomeningeal enhancement normal  Vascular: Normal arterial flow void. Developmental venous anomaly right cerebellum without hemorrhage  Skull and upper cervical spine: Negative  Sinuses/Orbits: Negative  Other: None  IMPRESSION: Image quality degraded by motion  Negative for metastatic disease.  No acute intracranial abnormality.   Electronically Signed   By: CFranchot GalloM.D.   On: 07/22/2017 15:13     PATHOLOGY:  Cytopathology endoscopic FNA: 07/19/16     Thoracentesis/Pleural fluid cytology: 07/15/17        ASSESSMENT & PLAN:   Recently progressed Stage IIIA (T3N2M0) adenocarcinoma of RLL lung; now with at least M1a Stage IV disease:  -Diagnosed in 07/2016; s/p concurrent chemoradiation with Cisplatin/Etoposide completing chemotherapy on 09/20/16. [facility]  Was started on maintenance/consolidation Darvalumab every 2 weeks, beginning on 12/02/16.  On 07/14/17, patient with progressive shortness of breath and STAT CT chest was done. Unfortunately CT imaging revealed developing (L) supraclavicular and subpectoral adenopathy suspicious for nodal mets; also pulmonary nodules new and enlarged in (L) lung suspicious for pulmonary mets. Mildly enlarged prevascular node warrants follow-up attention. Noted to have large (R) pleural effusion as well.  Based on these results, Imfinzi was held on 07/14/17 and discontinued.   -Underwent  therapeutic thoracentesis on 07/15/17; fortunately cytology of pleural fluid was negative for malignancy.  -CT abd/pelvis on 07/19/17 revealed concerning lesion to right liver dome, along with likely cirrhosis; MRI abd recommended. He is scheduled for MRI abd tomorrow, 07/29/17. Otherwise, no other findings to suggest metastatic disease in abdomen or pelvis.  -MRI brain done on 07/22/17 negative for malignancy.  -Based on all of the above results, we discussed that Mr. DAwbreynow has Stage IV lung cancer. His disease remains treatable, but is likely no longer "curable."  Our focus will be on treating/controlling the cancer with palliative intent.  -Discussed with Dr. ZTalbert Cage  She recommends we proceed with Tecentriq therapy. We briefly discussed common side effects of Tecentriq; nursing will help provide additional teaching as well.   -He was originally scheduled to start Tecentriq today, but wishes to wait until next week. I suspect depression/anxiety are playing a role in him wanting to defer treatment this week, which is understandable in light of recent news re: his disease.  -Return to cancer center next week on Thursday, 08/04/17 for cycle #1 Tecentriq.   -We will plan to follow-up with him the week after his first treatment for toxicity check.    Pleural effusion:  -Underwent thoracentesis on 07/15/17 for symptomatic large (R) pleural effusion. Shortness of breath improved. Still has intermittent cough, but this is largely stable.  -Cytology of pleural fluid negative for malignancy, which is reassuring.   -He understands he may need repeat thoracentesis procedures in the future if his symptoms recur.    Adjustment disorder with depressed/anxious mood:  -Understandably, Mr. DBettenis having a hard time adjusting to the news of metastatic disease.  We discussed additional pharmacologic management of his depression. His PCP, Dr. Maudie Mercury, manages his mental health and he has appt to see him in the coming weeks.  Suggested he consider talking to Dr. Maudie Mercury about medication dose adjustment, at least in the short-term to better manage his symptoms or irritability and mood swings.  I provided support today with active listening and validation of his concerns. I suspect some of this is a grief response as well, which is common in patients with progression of disease and cancer in general.  Encouraged him to let us know how we can continue to support him.      Dispo:  -Return next week for first cycle of Tecentriq.  -Follow-up in 2 weeks for toxicity check post-treatment.      All questions were answered to patient's stated satisfaction. Encouraged patient to call with any new concerns or questions before his next visit to the cancer center and we can certain see him sooner, if needed.    Plan of care discussed with Dr. Talbert Cage, who agrees with the above aforementioned.       Orders placed this encounter:  No orders of the defined types were placed in this encounter.     Mike Craze, NP [place] [phone]

## 2017-07-29 ENCOUNTER — Ambulatory Visit (HOSPITAL_COMMUNITY)
Admission: RE | Admit: 2017-07-29 | Discharge: 2017-07-29 | Disposition: A | Discharge status: Home / Self Care | Payer: Medicare Other | Source: Ambulatory Visit | Attending: Adult Health | Admitting: Adult Health

## 2017-07-29 DIAGNOSIS — Z9889 Other specified postprocedural states: Secondary | ICD-10-CM | POA: Insufficient documentation

## 2017-07-29 DIAGNOSIS — K7460 Unspecified cirrhosis of liver: Secondary | ICD-10-CM | POA: Diagnosis not present

## 2017-07-29 DIAGNOSIS — C3491 Malignant neoplasm of unspecified part of right bronchus or lung: Secondary | ICD-10-CM | POA: Diagnosis present

## 2017-07-29 DIAGNOSIS — R590 Localized enlarged lymph nodes: Secondary | ICD-10-CM | POA: Diagnosis not present

## 2017-07-29 MED ORDER — GADOBENATE DIMEGLUMINE 529 MG/ML IV SOLN
20.0000 mL | Freq: Once | INTRAVENOUS | Status: AC | PRN
  Administered 2017-07-29: 20 mL via INTRAVENOUS

## 2017-08-01 IMAGING — MR MR HEAD WO/W CM
7 of 13 series · 21 of 48 positions shown · IV contrast (multihance)
Comparison: None.

CLINICAL DATA: Recently diagnosed right lower lobe lung cancer.
Initial staging.

EXAM:
MRI HEAD WITHOUT AND WITH CONTRAST
TECHNIQUE: Multiplanar, multiecho pulse sequences of the brain and surrounding
structures were obtained without and with intravenous contrast.
CONTRAST:  20mL MULTIHANCE GADOBENATE DIMEGLUMINE 529 MG/ML IV SOLN

[Series 2: T1 · sagittal · 5.0mm · 0.41mm/px · 2 of 21 slices shown]
[im 1/21]
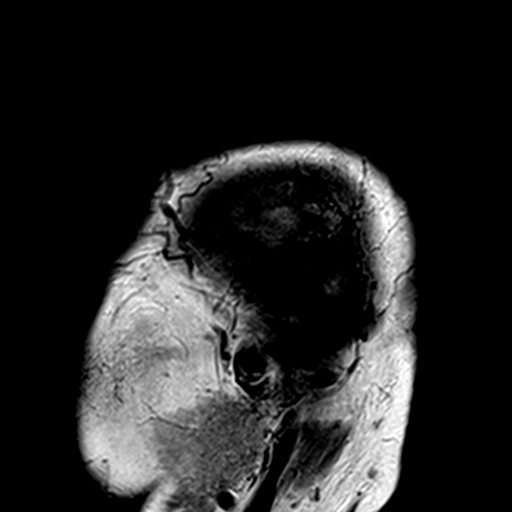
[im 21/21]
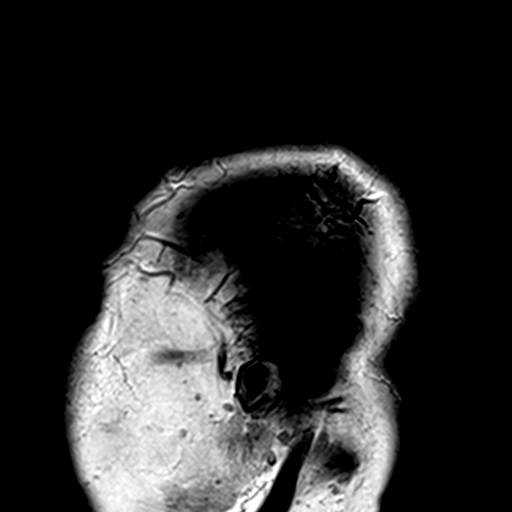

[Series 5: T2 · axial · 5.0mm · 0.48mm/px · z∈[-47,+94]mm · 2 of 23 slices shown (1 of 2)]
[im 1/23]
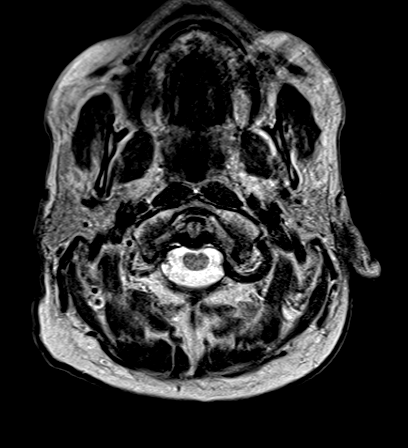
[im 23/23]
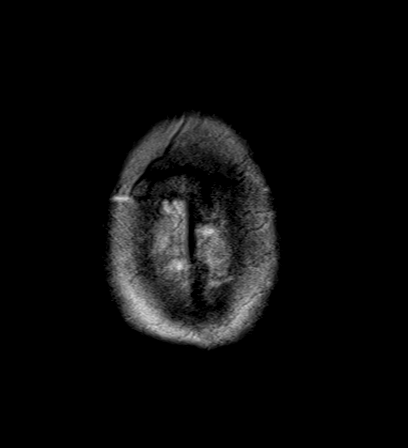

[Series 6: FLAIR · axial · 5.0mm · 0.34mm/px · z∈[-47,+95]mm · 2 of 23 slices shown]
[im 1/23]
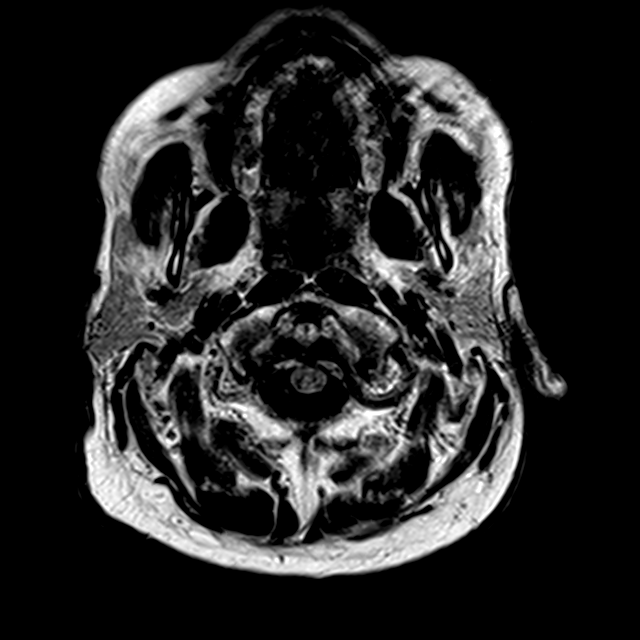
[im 23/23]
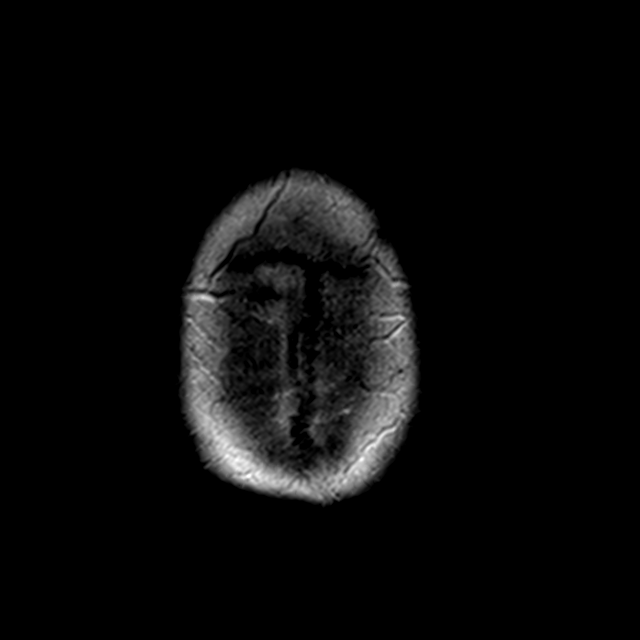

[Series 9: T2 · coronal · 5.0mm · 0.44mm/px · 2 of 26 slices shown (2 of 2)]
[im 1/26]
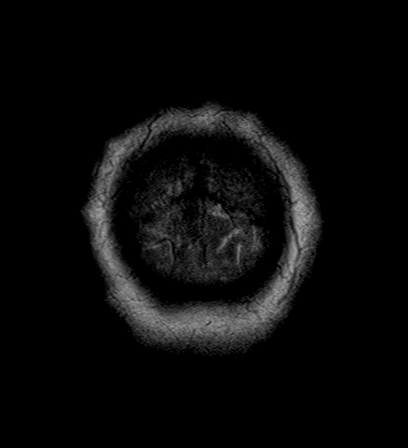
[im 26/26]
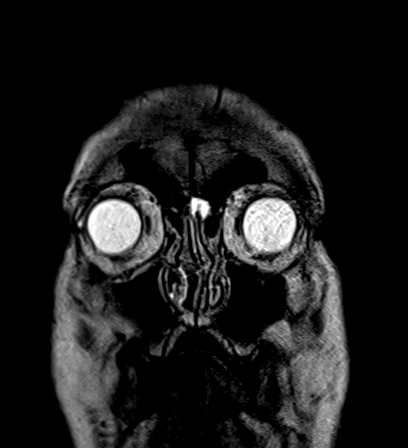

[Series 10: T1 post-contrast · axial · 2.0mm · 0.42mm/px · z∈[-56,+106]mm · 8 of 83 slices shown (1 of 3)]
[im 1/83]
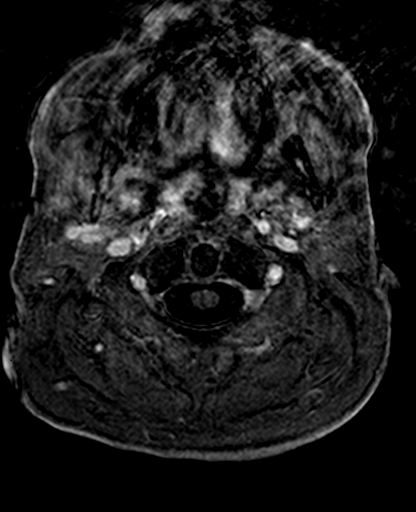
[im 12/83]
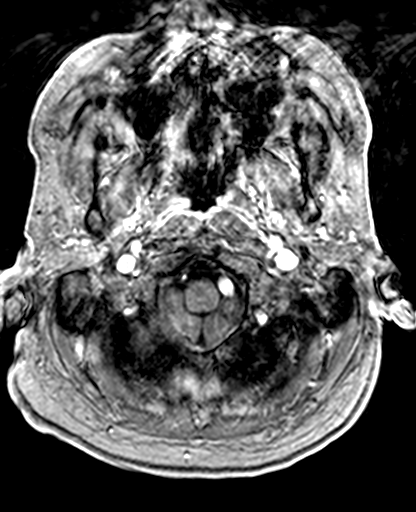
[im 24/83]
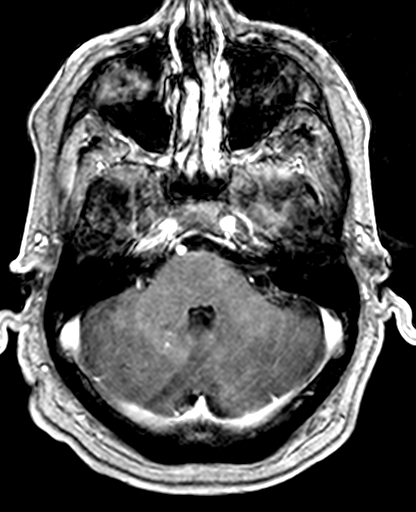
[im 36/83]
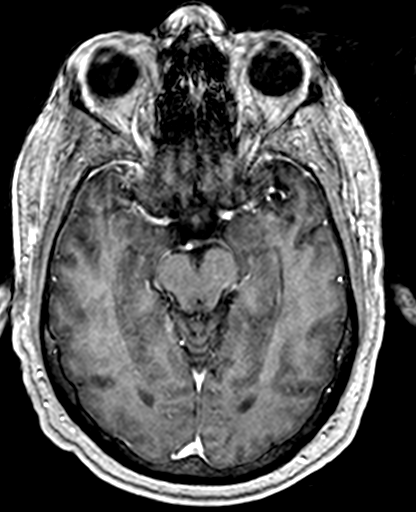
[im 47/83]
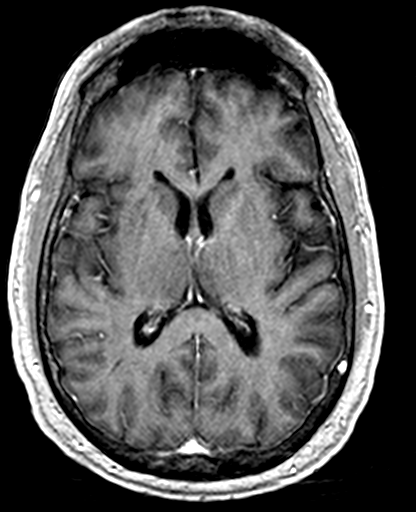
[im 59/83]
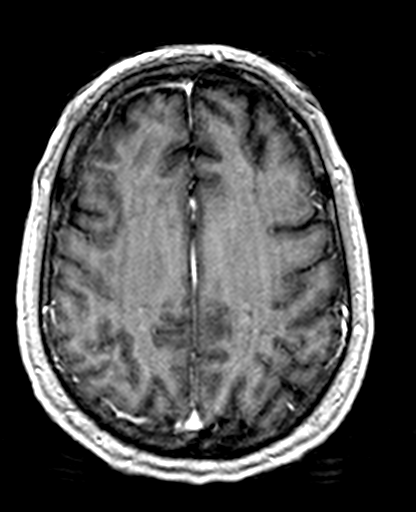
[im 71/83]
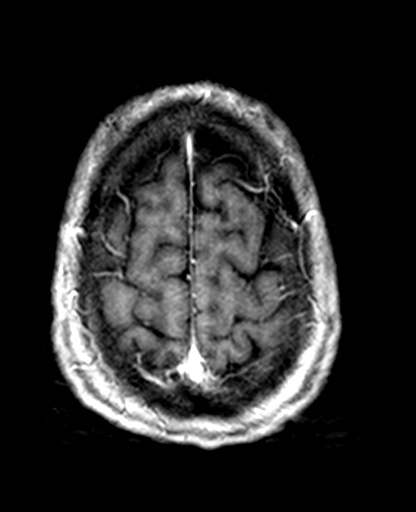
[im 83/83]
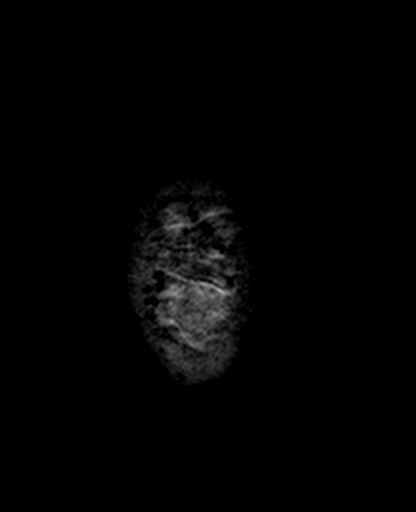

[Series 11: T1 post-contrast · coronal · 5.0mm · 0.39mm/px · 3 of 28 slices shown (2 of 3)]
[im 1/28]
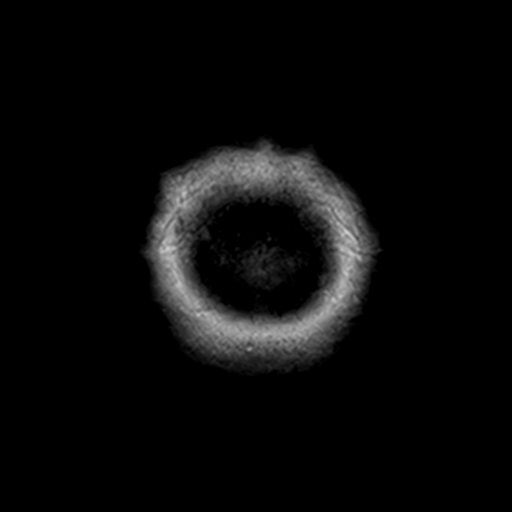
[im 14/28]
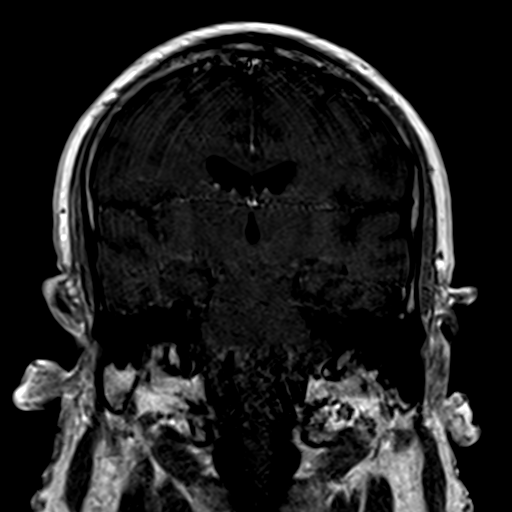
[im 28/28]
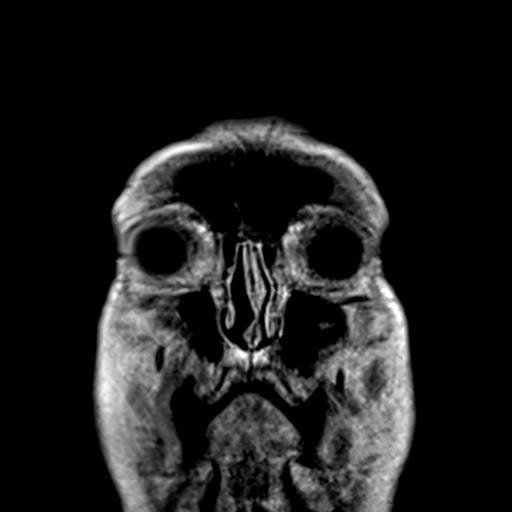

[Series 12: T1 post-contrast · sagittal · 5.0mm · 0.41mm/px · 2 of 20 slices shown (3 of 3)]
[im 1/20]
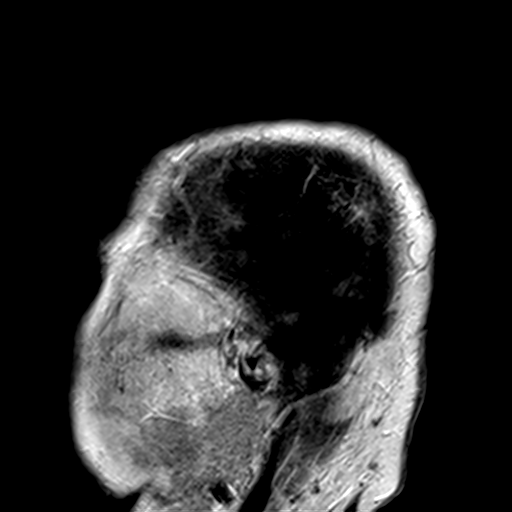
[im 20/20]
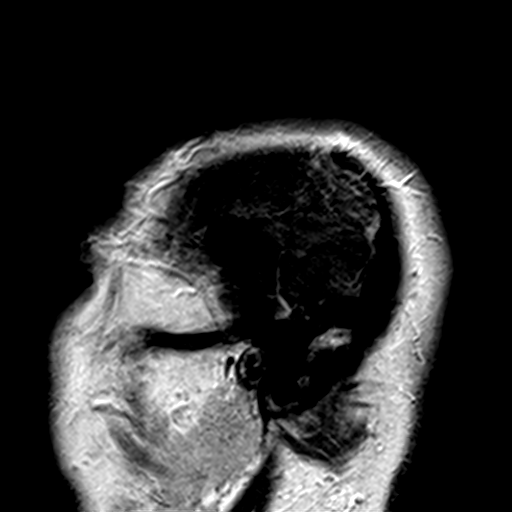

[21 of 48 positions shown; findings below may reference images not displayed]

FINDINGS: There is no evidence of acute infarct, intracranial hemorrhage,
mass, midline shift, or extra-axial fluid collection. The ventricles
and sulci are normal. The cerebellar tonsils minimally extend below
the foramen magnum, within normal limits. Small foci of T2
hyperintensity in the cerebral white matter are mildly advanced for
age.

A small developmental venous anomaly is noted in the right
cerebellum. No abnormal parenchymal or meningeal enhancement
suggestive of metastatic disease is seen. Motion artifact on
postcontrast imaging could potentially obscure very small lesions.

Orbits are unremarkable. No significant inflammatory disease is seen
in the paranasal sinuses are mastoid air cells. The major
intracranial vascular flow voids are preserved with the left
vertebral artery being dominant. No focal osseous lesion is
identified.
IMPRESSION: 1. No evidence of intracranial metastases.
2. Mild cerebral white matter disease, nonspecific but may reflect
chronic small vessel ischemia.

## 2017-08-02 ENCOUNTER — Other Ambulatory Visit (HOSPITAL_COMMUNITY): Payer: Self-pay | Admitting: Oncology

## 2017-08-04 ENCOUNTER — Encounter (HOSPITAL_COMMUNITY): Payer: Self-pay

## 2017-08-04 ENCOUNTER — Encounter (HOSPITAL_COMMUNITY): Payer: Medicare Other

## 2017-08-04 ENCOUNTER — Encounter (HOSPITAL_BASED_OUTPATIENT_CLINIC_OR_DEPARTMENT_OTHER): Payer: Medicare Other

## 2017-08-04 VITALS — BP 137/90 | HR 86 | Temp 98.0°F | Resp 18 | Wt 218.6 lb

## 2017-08-04 DIAGNOSIS — D751 Secondary polycythemia: Secondary | ICD-10-CM | POA: Diagnosis not present

## 2017-08-04 DIAGNOSIS — C799 Secondary malignant neoplasm of unspecified site: Secondary | ICD-10-CM | POA: Diagnosis not present

## 2017-08-04 DIAGNOSIS — C3491 Malignant neoplasm of unspecified part of right bronchus or lung: Secondary | ICD-10-CM

## 2017-08-04 DIAGNOSIS — C3431 Malignant neoplasm of lower lobe, right bronchus or lung: Secondary | ICD-10-CM | POA: Diagnosis not present

## 2017-08-04 DIAGNOSIS — Z5112 Encounter for antineoplastic immunotherapy: Secondary | ICD-10-CM | POA: Diagnosis not present

## 2017-08-04 LAB — TSH: TSH: 1.26 u[IU]/mL (ref 0.350–4.500)

## 2017-08-04 LAB — COMPREHENSIVE METABOLIC PANEL
ALBUMIN: 3.3 g/dL — AB (ref 3.5–5.0)
ALT: 20 U/L (ref 17–63)
AST: 27 U/L (ref 15–41)
Alkaline Phosphatase: 132 U/L — ABNORMAL HIGH (ref 38–126)
Anion gap: 7 (ref 5–15)
BUN: 8 mg/dL (ref 6–20)
CHLORIDE: 100 mmol/L — AB (ref 101–111)
CO2: 30 mmol/L (ref 22–32)
CREATININE: 0.73 mg/dL (ref 0.61–1.24)
Calcium: 8.7 mg/dL — ABNORMAL LOW (ref 8.9–10.3)
GFR calc non Af Amer: >60 mL/min (ref >60)
GLUCOSE: 119 mg/dL — AB (ref 65–99)
Potassium: 4 mmol/L (ref 3.5–5.1)
SODIUM: 137 mmol/L (ref 135–145)
Total Bilirubin: 0.5 mg/dL (ref 0.3–1.2)
Total Protein: 7 g/dL (ref 6.5–8.1)

## 2017-08-04 LAB — CBC WITH DIFFERENTIAL/PLATELET
BASOS ABS: 0 10*3/uL (ref 0.0–0.1)
BASOS PCT: 0 %
EOS ABS: 0.1 10*3/uL (ref 0.0–0.7)
EOS PCT: 2 %
HCT: 44.3 % (ref 39.0–52.0)
Hemoglobin: 14.9 g/dL (ref 13.0–17.0)
LYMPHS ABS: 0.9 10*3/uL (ref 0.7–4.0)
Lymphocytes Relative: 17 %
MCH: 32.6 pg (ref 26.0–34.0)
MCHC: 33.6 g/dL (ref 30.0–36.0)
MCV: 96.9 fL (ref 78.0–100.0)
Monocytes Absolute: 0.4 10*3/uL (ref 0.1–1.0)
Monocytes Relative: 8 %
Neutro Abs: 4 10*3/uL (ref 1.7–7.7)
Neutrophils Relative %: 73 %
PLATELETS: 145 10*3/uL — AB (ref 150–400)
RBC: 4.57 MIL/uL (ref 4.22–5.81)
RDW: 15.5 % (ref 11.5–15.5)
WBC: 5.5 10*3/uL (ref 4.0–10.5)

## 2017-08-04 MED ORDER — SODIUM CHLORIDE 0.9 % IV SOLN
Freq: Once | INTRAVENOUS | Status: AC
  Administered 2017-08-04: 12:00:00 via INTRAVENOUS

## 2017-08-04 MED ORDER — SODIUM CHLORIDE 0.9 % IV SOLN
1200.0000 mg | Freq: Once | INTRAVENOUS | Status: AC
  Administered 2017-08-04: 1200 mg via INTRAVENOUS
  Filled 2017-08-04: qty 20

## 2017-08-04 MED ORDER — SODIUM CHLORIDE 0.9% FLUSH
10.0000 mL | INTRAVENOUS | Status: DC | PRN
  Administered 2017-08-04: 10 mL
  Filled 2017-08-04: qty 10

## 2017-08-04 MED ORDER — HEPARIN SOD (PORK) LOCK FLUSH 100 UNIT/ML IV SOLN
500.0000 [IU] | Freq: Once | INTRAVENOUS | Status: AC | PRN
  Administered 2017-08-04: 500 [IU]

## 2017-08-04 NOTE — Patient Instructions (Signed)
[facility] Discharge Instructions for Patients Receiving Chemotherapy   Beginning January 23rd 2017 lab work for the Cancer Center will be done in the  Main lab at [place] on 1st floor. If you have a lab appointment with the Cancer Center please come in thru the  Main Entrance and check in at the main information desk   Today you received the following chemotherapy agents   To help prevent nausea and vomiting after your treatment, we encourage you to take your nausea medication     If you develop nausea and vomiting, or diarrhea that is not controlled by your medication, call the clinic.  The clinic phone number is [phone]. Office hours are Monday-Friday 8:30am-5:00pm.  BELOW ARE SYMPTOMS THAT SHOULD BE REPORTED IMMEDIATELY:  *FEVER GREATER THAN 101.0 F  *CHILLS WITH OR WITHOUT FEVER  NAUSEA AND VOMITING THAT IS NOT CONTROLLED WITH YOUR NAUSEA MEDICATION  *UNUSUAL SHORTNESS OF BREATH  *UNUSUAL BRUISING OR BLEEDING  TENDERNESS IN MOUTH AND THROAT WITH OR WITHOUT PRESENCE OF ULCERS  *URINARY PROBLEMS  *BOWEL PROBLEMS  UNUSUAL RASH Items with * indicate a potential emergency and should be followed up as soon as possible. If you have an emergency after office hours please contact your primary care physician or go to the nearest emergency department.  Please call the clinic during office hours if you have any questions or concerns.   You may also contact the Patient Navigator at [phone] should you have any questions or need assistance in obtaining follow up care.      Resources For Cancer Patients and their Caregivers ? [employer]: Can assist with transportation, wigs, general needs, runs Look Good Feel Better.        1-888-227-6333 ? Cancer Care: Provides financial assistance, online support groups, medication/co-pay assistance.  1-800-813-HOPE (4673) ? [facility] Assists [place] cancer  patients and their families through emotional , educational and financial support.  336-427-4357 ? [place] DSS Where to apply for food stamps, Medicaid and utility assistance. 336-342-1394 ? [facility]: Transportation to medical appointments. 336-347-2287 ? Social Security Administration: May apply for disability if have a Stage IV cancer. 336-342-7796 1-800-772-1213 ? [place] [facility] Transit [facility]: Assists with nutrition, care and transit needs. 336-349-2343         

## 2017-08-04 NOTE — Progress Notes (Signed)
Labs reviewed with Mike Craze NP. Will proceed with Tecentriq today.consent obtained. Packet given and went through with patient . Patient understands instructions and is ready for treatment today.  Treatment given per orders. Patient tolerated it well without problems. Vitals stable and discharged home from clinic ambulatory. Follow up as scheduled.

## 2017-08-05 ENCOUNTER — Telehealth (HOSPITAL_COMMUNITY): Payer: Self-pay

## 2017-08-05 NOTE — Telephone Encounter (Signed)
24 hour follow up- patient states he is a little more tired but overall is doing ok. Will call us if he needs [place].

## 2017-08-11 NOTE — Assessment & Plan Note (Addendum)
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 Roxan Hockey on 07/21/2016.  He is S/P concomitant chemoXRT with Cisplatin/Etoposide (08/17/2016- 09/20/2016).  He was then transitioned to consolidative immunotherapy with Imfinzi, but then developed progression of disease on imaging in 07/2017.  His treatment was therefore transitioned to Tecentriq beginning on 08/04/2017.  No repeat biopsy performed at time of progression.  Not sufficient tissue from original diagnosis for [facility] testing.  No role for labs today from an oncology perspective.  I personally reviewed and went over radiographic studies with the patient.  The results are noted within this dictation.  I personally reviewed the images in PACS.  MRI brain is negative for metastatic disease.  MRI liver does not show any hepatic lesions.  CT chest demonstrated a developing left supraclavicular and subpectoral oral lymph node in addition to new and enlarging pulmonary nodules suspicious for metastatic disease.  I have discussed the patient's case with IR to see if the supraclavicular lymph node is amenable to biopsy. This is important to confirm progression of disease in addition to send for molecular testing including BRAF/EGFR/ALK/ROS1/PDL1.  I would prefer test goes to [facility].  Order is placed for CT-guided biopsy of lymph node for [facility] testing.  Smoking cessation education provided today.  Return on 08/25/2017 for next cycle of immunotherapy. Return on 09/15/2017 for follow-up and ongoing treatment.  If pathology returns and a molecular mutation that is targetable is found, then will work patient into the clinic sooner.

## 2017-08-12 ENCOUNTER — Encounter (HOSPITAL_COMMUNITY): Payer: Medicare Other | Attending: Hematology | Admitting: Oncology

## 2017-08-12 ENCOUNTER — Encounter (HOSPITAL_COMMUNITY): Payer: Self-pay | Admitting: Oncology

## 2017-08-12 VITALS — BP 138/85 | HR 93 | Temp 97.8°F | Resp 18 | Ht 72.0 in | Wt 216.8 lb

## 2017-08-12 DIAGNOSIS — R05 Cough: Secondary | ICD-10-CM

## 2017-08-12 DIAGNOSIS — K208 Other esophagitis without bleeding: Secondary | ICD-10-CM

## 2017-08-12 DIAGNOSIS — T66XXXA Radiation sickness, unspecified, initial encounter: Secondary | ICD-10-CM

## 2017-08-12 DIAGNOSIS — R1011 Right upper quadrant pain: Secondary | ICD-10-CM | POA: Diagnosis not present

## 2017-08-12 DIAGNOSIS — D751 Secondary polycythemia: Secondary | ICD-10-CM | POA: Insufficient documentation

## 2017-08-12 DIAGNOSIS — R1013 Epigastric pain: Secondary | ICD-10-CM

## 2017-08-12 DIAGNOSIS — R0602 Shortness of breath: Secondary | ICD-10-CM | POA: Diagnosis not present

## 2017-08-12 DIAGNOSIS — R059 Cough, unspecified: Secondary | ICD-10-CM

## 2017-08-12 DIAGNOSIS — C3491 Malignant neoplasm of unspecified part of right bronchus or lung: Secondary | ICD-10-CM

## 2017-08-12 NOTE — Progress Notes (Signed)
Erik Gravel, MD [address] [place] [address]  Adenocarcinoma of right lung [facility]) - Plan: CT Biopsy, CANCELED: CT Biopsy  CURRENT THERAPY: Tecentriq beginning on 08/04/2017  INTERVAL HISTORY: Erik [name] 56 y.o. male returns for followup of progressive non-small cell lung cancer, adenocarcinoma type.  HPI Elements   Location: Right middle lobe lung   Quality: Adenocarcinoma   Severity: Stage IV   Duration: Initially diagnosed on 07/21/2016   Context: Initially diagnosed with stage IIIa disease status post concomitant chemoradiation finishing on 09/20/2016 followed by consolidative immunotherapy, Imfinzi, but then with progression of disease noted on imaging in 07/2017, resulting in a change of therapy to Tecentriq  Timing:   Modifying Factors: No repeat biopsy at time of progression of disease.Left pleural effusion   Associated Signs & Symptoms: RUQ abdominal discomfort.   He complains of a right upper quadrant abdominal discomfort. He describes it as the following: "It feels like someone inserted Bengay under the skin in that area and sealed my skin back up."  He notes the discomfort comes and goes. He denies any fevers or chills.  He has a history of left pleural effusion. He reports an increase in shortness of breath particularly when laying flat. Examination demonstrates no breath sounds in the left lower lobe with crackles.   Review of Systems  Constitutional: Negative.  Negative for chills, fever and weight loss.  HENT: Negative.   Eyes: Negative.   Respiratory: Positive for cough and shortness of breath.   Cardiovascular: Negative.  Negative for chest pain.  Gastrointestinal: Negative.  Negative for blood in stool, constipation, diarrhea, melena, nausea and vomiting.  Genitourinary: Negative.   Musculoskeletal: Negative.   Skin: Negative.   Neurological: Negative.  Negative for weakness.  Endo/Heme/Allergies: Negative.     Psychiatric/Behavioral: Negative.     Past Medical History:  Diagnosis Date  . 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 Arthritis   . Asthma   . Chronic back pain   . Depression   . GERD (gastroesophageal reflux disease)   . Hypertension   . Seasonal allergies   . Spinal stenosis of lumbar region   . 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 Wheezing     Past Surgical History:  Procedure Laterality Date  . BACK SURGERY     5 total  . BIOPSY  01/03/2017   Procedure: BIOPSY;  Surgeon: Daneil Dolin, MD;  [facility];  Service: Endoscopy;;  gastric  . COLONOSCOPY WITH PROPOFOL N/A 04/04/2017   Procedure: COLONOSCOPY WITH PROPOFOL;  Surgeon: Daneil Dolin, MD;  [facility];  Service: Endoscopy;  Laterality: N/A;  8:45am  . ESOPHAGOGASTRODUODENOSCOPY (EGD) WITH PROPOFOL N/A 01/03/2017   Procedure: ESOPHAGOGASTRODUODENOSCOPY (EGD) WITH PROPOFOL;  Surgeon: Daneil Dolin, MD;  [facility];  Service: Endoscopy;  Laterality: N/A;  . Venia Minks DILATION N/A 01/03/2017   Procedure: Venia Minks DILATION;  Surgeon: Daneil Dolin, MD;  [facility];  Service: Endoscopy;  Laterality: N/A;  . MULTIPLE EXTRACTIONS WITH ALVEOLOPLASTY N/A 07/08/2014   Procedure: MULTIPLE EXTRACION WITH ALVEOLOPLASTY with EXCISION LESION RIGHT SIDE OF TONGUE;  Surgeon: Gae Bon, DDS;  Location: [place];  Service: Oral Surgery;  Laterality: N/A;  . PORTACATH PLACEMENT Right 07/30/2016   Procedure: INSERTION PORT-A-CATH;  Surgeon: Vickie Epley, MD;  Location: [facility];  Service: Vascular;  Laterality: Right;  [name] VIDEO BRONCHOSCOPY WITH ENDOBRONCHIAL ULTRASOUND N/A 07/19/2016  Procedure: VIDEO BRONCHOSCOPY WITH ENDOBRONCHIAL ULTRASOUND;  Surgeon: [name], MD;  Location: [facility] OR;  Service: Thoracic;  Laterality: N/A;    Family History  Problem Relation Age of Onset  . Cancer Mother        breast cancer  . Heart failure Brother   . Colon cancer Neg Hx         not sure, ?grandfather and/or uncle    Social History   Social History  . Marital status: Divorced    Spouse name: N/A  . Number of children: N/A  . Years of education: N/A   Social History Main Topics  . Smoking status: Current Every Day Smoker    Packs/day: 0.25    Years: 38.00    Types: Cigarettes  . Smokeless tobacco: Former Systems developer    Types: Snuff     Comment: using chantix- smoking once in a while  . Alcohol use 1.2 oz/week    2 Cans of beer per week     Comment: reports drinking 0-2 beers a week  . Drug use: Yes    Frequency: 7.0 times per week    Types: Marijuana     Comment: daily for cancer  . Sexual activity: Not Currently   Other Topics Concern  . None   Social History Narrative  . None     PHYSICAL EXAMINATION  ECOG PERFORMANCE STATUS: 1 - Symptomatic but completely ambulatory  Vitals:   08/12/17 1432  BP: 138/85  Pulse: 93  Resp: 18  Temp: 97.8 F (36.6 C)  SpO2: 94%    GENERAL:alert, no distress, well nourished, well developed, comfortable, cooperative, smiling and appearing older than stated age, unaccompanied. SKIN: skin color, texture, turgor are normal, no rashes or significant lesions HEAD: Normocephalic, No masses, lesions, tenderness or abnormalities EYES: normal, Conjunctiva are pink and non-injected EARS: External ears normal OROPHARYNX:lips, buccal mucosa, and tongue normal and mucous membranes are moist  NECK: supple LYMPH:  not examined BREAST:not examined LUNGS: clear to auscultation and percussion EXCEPT in left lower base and left mid lung where there are no breath sounds and crackles on inspiration HEART: regular rate & rhythm ABDOMEN:abdomen soft, non-tender, obese and normal bowel sounds BACK: Back symmetric, no curvature. EXTREMITIES:less then 2 second capillary refill, no joint deformities, effusion, or inflammation, no skin discoloration, no cyanosis  NEURO: alert & oriented x 3 with fluent speech, no focal  motor/sensory deficits, gait normal   LABORATORY DATA: CBC    Component Value Date/Time   WBC 5.5 08/04/2017 0956   RBC 4.57 08/04/2017 0956   HGB 14.9 08/04/2017 0956   HCT 44.3 08/04/2017 0956   PLT 145 (L) 08/04/2017 0956   MCV 96.9 08/04/2017 0956   MCH 32.6 08/04/2017 0956   MCHC 33.6 08/04/2017 0956   RDW 15.5 08/04/2017 0956   LYMPHSABS 0.9 08/04/2017 0956   MONOABS 0.4 08/04/2017 0956   EOSABS 0.1 08/04/2017 0956   BASOSABS 0.0 08/04/2017 0956      Chemistry      Component Value Date/Time   NA 137 08/04/2017 0956   K 4.0 08/04/2017 0956   CL 100 (L) 08/04/2017 0956   CO2 30 08/04/2017 0956   BUN 8 08/04/2017 0956   CREATININE 0.73 08/04/2017 0956      Component Value Date/Time   CALCIUM 8.7 (L) 08/04/2017 0956   ALKPHOS 132 (H) 08/04/2017 0956   AST 27 08/04/2017 0956   ALT 20 08/04/2017 0956   BILITOT 0.5 08/04/2017 3007  PENDING LABS:   RADIOGRAPHIC STUDIES:  Dg Chest 1 View  Result Date: 07/15/2017 CLINICAL DATA:  Squamous cell carcinoma of the RIGHT lung post chemotherapy and radiation therapy, RIGHT pleural effusion EXAM: CHEST 1 VIEW COMPARISON:  CT chest 07/14/2017 FINDINGS: RIGHT jugular Port-A-Cath with tip projecting over cavoatrial junction. Volume loss in RIGHT hemithorax with mediastinal shift to the RIGHT. Grossly normal heart size and pulmonary vascularity. Decrease in RIGHT pleural effusion post thoracentesis. Residual effusion and atelectasis at RIGHT base. LEFT lung clear. No pneumothorax. IMPRESSION: No pneumothorax following RIGHT thoracentesis. Electronically Signed   By: Lavonia Dana M.D.   On: 07/15/2017 12:59   Ct Chest W Contrast  Result Date: 07/14/2017 CLINICAL DATA:  Cough. Shortness of breath. Stage IIIA adenocarcinoma of right lung. evaluate for immunotherapy induced pneumonitis versus worsening pleural effusion. EXAM: CT CHEST WITH CONTRAST TECHNIQUE: Multidetector CT imaging of the chest was performed during intravenous  contrast administration. CONTRAST:  75 cc of Isovue-300 COMPARISON:  06/01/2017 FINDINGS: Cardiovascular: Right-sided Port-A-Cath terminates at the high right atrium. Normal aortic caliber. Normal heart size, without pericardial effusion. No central pulmonary embolism, on this non-dedicated study. Mediastinum/Nodes: A deep left subpectoral node measures 1.6 cm on image 18/series 2 and is position just cephalad to the left subclavian artery. This is new. Posterior left supraclavicular node measures 12 mm on image 8/series 2. Incompletely imaged on the prior, but likely enlarged. Radiation induced mediastinal edema, without well-defined middle mediastinal adenopathy. No hilar adenopathy. A prevascular node measures 8 mm on image 58/series 2 versus 7 mm previously. Lungs/Pleura: Moderate right-sided pleural effusion is increased since prior CT. Residual mild loculation anteriorly and inferiorly. Mass-effect upon right-sided airways is increased, including near complete obstruction of the right middle lobe bronchus on image 87/series 4. Underlying centrilobular emphysema. 3 mm subpleural right upper lobe pulmonary nodule is similar on image 65/series 4. A left upper lobe pulmonary nodule measures 6 mm on image 48/series 4 and is enlarged from the 2 to 3 mm on image 48/series 4 of the prior. A 2 mm left upper lobe nodule on image 75/series 4 is likely present on the prior. Tiny left upper lobe nodules on image 56/series 4 and image 33/series 4 are not readily apparent previously. Medial right middle lobe and right lower lobe consolidation again identified. This is slightly increased, especially at the lateral right lung base. No well-defined residual or recurrent mass within. Upper Abdomen: Mild cirrhosis. Normal imaged portions of the spleen, stomach, pancreas, gallbladder, adrenal glands, kidneys. Portal caval adenopathy measures 1.9 cm on image 164/series 2. Compare similar to the prior exam (when remeasured).  Aortocaval node of 1.2 cm on image 177/series 2 was similar on 06/17/2016, favoring a reactive etiology. Musculoskeletal: No acute osseous abnormality. IMPRESSION: 1. Progression of medial right lung base consolidation which is presumably radiation induced. No typical findings to suggest immunotherapy induced pneumonitis. 2. Increase in moderate right-sided pleural effusion. 3. Developing left supraclavicular and subpectoral adenopathy, suspicious for nodal metastasis. 4. Similarly, pulmonary nodules which are likely new and enlarged, suspicious for pulmonary metastasis. 5. Mildly enlarged prevascular node warrants followup attention. 6. Cirrhosis with upper abdominal, likely reactive adenopathy. 7.  Emphysema (ICD10-J43.9). Electronically Signed   By: Abigail Miyamoto M.D.   On: 07/14/2017 12:46   Mr Jeri Cos ZO Contrast  Result Date: 07/22/2017 CLINICAL DATA:  Adenocarcinoma lung.  Nausea and vomiting EXAM: MRI HEAD WITHOUT AND WITH CONTRAST TECHNIQUE: Multiplanar, multiecho pulse sequences of the brain and surrounding structures were obtained without and  with intravenous contrast. CONTRAST:  23m MULTIHANCE GADOBENATE DIMEGLUMINE 529 MG/ML IV SOLN COMPARISON:  MRI head 07/22/2016 FINDINGS: Brain: Image quality degraded by motion. Ventricle size normal. Cerebral volume normal. Negative for acute infarct. Small nonenhancing white matter hyperintensities bilaterally are similar to the prior study and most consistent with chronic microvascular ischemia. Negative for hemorrhage or mass. No enhancing mass lesion postcontrast. Small developmental venous anomaly in the right cerebellum. Leptomeningeal enhancement normal Vascular: Normal arterial flow void. Developmental venous anomaly right cerebellum without hemorrhage Skull and upper cervical spine: Negative Sinuses/Orbits: Negative Other: None IMPRESSION: Image quality degraded by motion Negative for metastatic disease.  No acute intracranial abnormality.  Electronically Signed   By: CFranchot GalloM.D.   On: 07/22/2017 15:13   Mr Abdomen W Wo Contrast  Result Date: 07/29/2017 CLINICAL DATA:  Right lung adenocarcinoma with new liver lesion. EXAM: MRI ABDOMEN WITHOUT AND WITH CONTRAST TECHNIQUE: Multiplanar multisequence MR imaging of the abdomen was performed both before and after the administration of intravenous contrast. CONTRAST:  213mMULTIHANCE GADOBENATE DIMEGLUMINE 529 MG/ML IV SOLN COMPARISON:  CT of 07/19/2017. 07/01/2016 PET. CT PET of the abdomen of 06/17/2016. FINDINGS: Minimal motion degradation. Lower chest: Right-sided pleural fluid with pleural thickening. Right base airspace disease as on prior CT. Normal heart size with minimal anterior pericardial fluid or thickening. Hepatobiliary: Mild cirrhosis, as evidenced by caudate and lateral segment left liver lobe enlargement. Mildly irregular hepatic capsule. Corresponding to the CT abnormality, within the high posterior right liver lobe, is a geographic area of relatively well-defined subtle T2 hyperintensity and T1 hypointensity. Demonstrates early post-contrast hypoenhancement including at 4.5 x 2.1 cm on image 14/ series 5006. Delayed post-contrast hyper enhancement on image 17/series 5001 Normal in size, without focal abnormality. Pancreas:  Fatty replacement throughout.  No duct dilatation. Spleen:  Normal in size, without focal abnormality. Adrenals/Urinary Tract: Normal adrenal glands. Bilateral too small to characterize renal lesions. No hydronephrosis. Stomach/Bowel: Normal stomach and abdominal bowel loops. Vascular/Lymphatic: Normal caliber of the aorta and branch vessels. Separate origins of the common hepatic and celiac arteries. Patent hepatic and portal veins. Abdominal adenopathy is chronic and was detailed on the 07/19/2017 CT. Other:  No ascites. Musculoskeletal: Lumbar spine fixation. IMPRESSION: 1. Mild cirrhosis. The CT abnormality corresponds to a geographic area of abnormal  signal within the hepatic dome. Given its morphology and position immediately adjacent to a treated right lower lobe lung cancer, this is most consistent with radiation induced hepatitis/fibrosis. 2. Chronic abdominal adenopathy, likely reactive. 3. Right-sided pleural fluid and adjacent airspace disease, as on prior CTs. Electronically Signed   By: KyAbigail Miyamoto.D.   On: 07/29/2017 13:35   Ct Abdomen Pelvis W Contrast  Result Date: 07/20/2017 CLINICAL DATA:  Recurrent right lung adenocarcinoma, presenting for restaging. EXAM: CT ABDOMEN AND PELVIS WITH CONTRAST TECHNIQUE: Multidetector CT imaging of the abdomen and pelvis was performed using the standard protocol following bolus administration of intravenous contrast. CONTRAST:  10059mSOVUE-300 IOPAMIDOL (ISOVUE-300) INJECTION 61% COMPARISON:  11/15/2016 CT abdomen/ pelvis.  07/14/2017 chest CT. FINDINGS: Lower chest: Small to moderate loculated basilar right pleural effusion, slightly decreased since 07/14/2017. Superior approach central venous catheter tip is seen at the cavoatrial junction. Stable partially visualized sharply marginated consolidation, volume loss, distortion and bronchiectasis in the right lower lung, compatible with radiation fibrosis. Hepatobiliary: Liver surface appears diffusely finely irregular with slight hypertrophy of the lateral segment left liver lobe, suggesting cirrhosis. There is a vague low-attenuation 2.5 x 2.2 cm focus in the  posterior right liver dome (series 2/ image 16), not definitely seen on the prior CT abdomen study. No additional potential liver lesions. Normal gallbladder with no radiopaque cholelithiasis. No biliary ductal dilatation. Pancreas: Normal, with no mass or duct dilation. Spleen: Normal size. No mass. Adrenals/Urinary Tract: Normal adrenals. Normal kidneys with no hydronephrosis and no renal mass. Normal bladder. Stomach/Bowel: Grossly normal stomach. Normal caliber small bowel with no small bowel wall  thickening. Normal appendix. Minimal sigmoid diverticulosis, with no large bowel wall thickening or pericolonic fat stranding. Oral contrast reaches the rectum. Vascular/Lymphatic: Normal caliber abdominal aorta. Patent portal, splenic and renal veins. Stable mildly enlarged 1.2 cm aortocaval node (series 2/ image 37). Stable mildly enlarged 1.8 cm portacaval node (series 2/ image 31). Stable mildly enlarged 1.3 cm porta hepatis node (series 2/ image 27). No new pathologically enlarged abdominopelvic nodes. Reproductive: Normal size prostate with nonspecific coarse internal prostatic calcifications. Other: No pneumoperitoneum, ascites or focal fluid collection. Musculoskeletal: No aggressive appearing focal osseous lesions. Status post bilateral posterior spinal fusion from L3-S1, with no evidence of hardware fracture or loosening. Moderate thoracolumbar spondylosis. IMPRESSION: 1. Morphologic changes in the liver suggestive of early cirrhosis. New vague low-attenuation focus in the posterior right liver dome, cannot exclude hepatocellular carcinoma or less likely liver metastasis. Recommend further evaluation with MRI abdomen without and with IV contrast. This recommendation follows ACR consensus guidelines: Managing Incidental Findings on Abdominal CT: White Paper of the ACR Incidental Findings Committee. J Am Coll Radiol 2010;7:754-773. 2. Stable mild upper retroperitoneal lymphadenopathy, compatible with benign reactive adenopathy. 3. Otherwise no potential abdominopelvic findings of metastatic disease. 4. Small to moderate loculated basilar right pleural effusion, slightly decreased. Electronically Signed   By: Ilona Sorrel M.D.   On: 07/20/2017 09:01   US Thoracentesis Asp Pleural Space W/img Guide  Result Date: 07/15/2017 INDICATION: Squamous cell carcinoma of the RIGHT lung post chemotherapy and radiation therapy, RIGHT pleural effusion EXAM: ULTRASOUND GUIDED DIAGNOSTIC AND THERAPEUTIC RIGHT  THORACENTESIS MEDICATIONS: None. COMPLICATIONS: None immediate. PROCEDURE: Procedure, benefits, and risks of procedure were discussed with patient. Written informed consent for procedure was obtained. Time out protocol followed. Pleural effusion localized by ultrasound at the posterior RIGHT hemithorax. Skin prepped and draped in usual sterile fashion. Skin and soft tissues anesthetized with 10 mL of 1% lidocaine. 8 French thoracentesis catheter placed into the RIGHT pleural space. 1.3 L of yellow fluid aspirated by syringe pump. Procedure tolerated well by patient without immediate complication. FINDINGS: A total of approximately 1.3 L of RIGHT pleural fluid was removed. Samples were sent to the laboratory as requested by the clinical team. IMPRESSION: Successful ultrasound guided RIGHT thoracentesis yielding 1.3 L of pleural fluid. Electronically Signed   By: Lavonia Dana M.D.   On: 07/15/2017 13:04     PATHOLOGY:    ASSESSMENT AND PLAN:  Adenocarcinoma of right lung ([place]) Stage IV (E3P2R5J) adenocarcinoma of right lung, initially diagnosed with Stage IIIA (T3N2M0) disease on RML biopsy by Dr. Roxan Hockey on 07/21/2016.  He is S/P concomitant chemoXRT with Cisplatin/Etoposide (08/17/2016- 09/20/2016).  He was then transitioned to consolidative immunotherapy with Imfinzi, but then developed progression of disease on imaging in 07/2017.  His treatment was therefore transitioned to Tecentriq beginning on 08/04/2017.  No repeat biopsy performed at time of progression.  Not sufficient tissue from original diagnosis for [facility] testing.  No role for labs today from an oncology perspective.  I personally reviewed and went over radiographic studies with the patient.  The results are noted within  this dictation.  I personally reviewed the images in PACS.  MRI brain is negative for metastatic disease.  MRI liver does not show any hepatic lesions.  CT chest demonstrated a developing left supraclavicular and  subpectoral oral lymph node in addition to new and enlarging pulmonary nodules suspicious for metastatic disease.  I have discussed the patient's case with IR to see if the supraclavicular lymph node is amenable to biopsy. This is important to confirm progression of disease in addition to send for molecular testing including BRAF/EGFR/ALK/ROS1/PDL1.  I would prefer test goes to [facility].  Order is placed for CT-guided biopsy of lymph node for [facility] testing.  Smoking cessation education provided today.  Return on 08/25/2017 for next cycle of immunotherapy. Return on 09/15/2017 for follow-up and ongoing treatment.  If pathology returns and a molecular mutation that is targetable is found, then will work patient into the clinic sooner.    2. Abdominal pain, epigastric RUQ, suspect it is secondary to radiation fibrosis.  3. Radiation-induced esophagitis Improved/resolved  4. Cough Increasing, likely secondary to re-accumulation of L pleural effusion.  5. SOB (shortness of breath) Increasing, presumed to be due to enlarging L pleural effusion.  He refuses chest xray today, but will call if SOB worsens.  I would not hesitate to get him set-up for a repeat thoracentesis and send it for cytology.  If repeatedly needed, would consider PleurX catheter.  ORDERS PLACED FOR THIS ENCOUNTER: Orders Placed This Encounter  Procedures  . CT Biopsy    MEDICATIONS PRESCRIBED THIS ENCOUNTER: No orders of the defined types were placed in this encounter.   THERAPY PLAN:  Continue with treatment as planned.  It will be necessary to biopsy prove recurrence/progression of disease.  Molecular testing will also be imperative to guide future treatment options.  All questions were answered. The patient knows to call the clinic with any problems, questions or concerns. We can certainly see the patient much sooner if necessary.  Patient and plan discussed with Dr. Twana First and she is in agreement with  the aforementioned.   This note is electronically signed by: Doy Mince 08/12/2017 3:17 PM

## 2017-08-12 NOTE — Addendum Note (Signed)
Addended by: Baird Cancer on: 08/12/2017 04:30 PM   Modules accepted: Orders

## 2017-08-12 NOTE — Patient Instructions (Signed)
Erik [name] at [facility]  Discharge Instructions:  You will be scheduled for a CT biopsy. Return as scheduled for treatment. Return around 5 weeks for treatment and follow up visit.  _______________________________________________________________  Thank you for choosing [place] at [facility] to provide your oncology and hematology care.  To afford each patient quality time with our providers, please arrive at least 15 minutes before your scheduled appointment.  You need to re-schedule your appointment if you arrive 10 or more minutes late.  We strive to give you quality time with our providers, and arriving late affects you and other patients whose appointments are after yours.  Also, if you no show three or more times for appointments you may be dismissed from the clinic.  Again, thank you for choosing [place] at [place] hope is that these requests will allow you access to exceptional care and in a timely manner. _______________________________________________________________  If you have questions after your visit, please contact our office at [phone] between the hours of 8:30 a.m. and 5:00 p.m. Voicemails left after 4:30 p.m. will not be returned until the following business day. _______________________________________________________________  For prescription refill requests, have your pharmacy contact our office. _______________________________________________________________  Recommendations made by the consultant and any test results will be sent to your referring physician. _______________________________________________________________

## 2017-08-15 ENCOUNTER — Other Ambulatory Visit (HOSPITAL_COMMUNITY): Payer: Self-pay | Admitting: Oncology

## 2017-08-15 DIAGNOSIS — C3491 Malignant neoplasm of unspecified part of right bronchus or lung: Secondary | ICD-10-CM

## 2017-08-18 ENCOUNTER — Ambulatory Visit (HOSPITAL_COMMUNITY): Payer: Medicare Other | Admitting: Adult Health

## 2017-08-18 ENCOUNTER — Ambulatory Visit (HOSPITAL_COMMUNITY): Payer: Medicare Other

## 2017-08-22 ENCOUNTER — Emergency Department (HOSPITAL_COMMUNITY): Payer: Medicare Other

## 2017-08-22 ENCOUNTER — Encounter (HOSPITAL_COMMUNITY): Payer: Self-pay | Admitting: Emergency Medicine

## 2017-08-22 ENCOUNTER — Other Ambulatory Visit: Payer: Self-pay | Admitting: Student

## 2017-08-22 ENCOUNTER — Other Ambulatory Visit: Payer: Self-pay

## 2017-08-22 ENCOUNTER — Emergency Department (HOSPITAL_COMMUNITY)
Admission: EM | Admit: 2017-08-22 | Discharge: 2017-08-22 | Disposition: A | Discharge status: Home / Self Care | Payer: Medicare Other | Attending: Emergency Medicine | Admitting: Emergency Medicine

## 2017-08-22 ENCOUNTER — Telehealth (HOSPITAL_COMMUNITY): Payer: Self-pay

## 2017-08-22 DIAGNOSIS — J45909 Unspecified asthma, uncomplicated: Secondary | ICD-10-CM | POA: Insufficient documentation

## 2017-08-22 DIAGNOSIS — R05 Cough: Secondary | ICD-10-CM | POA: Insufficient documentation

## 2017-08-22 DIAGNOSIS — R0902 Hypoxemia: Secondary | ICD-10-CM | POA: Insufficient documentation

## 2017-08-22 DIAGNOSIS — F17210 Nicotine dependence, cigarettes, uncomplicated: Secondary | ICD-10-CM | POA: Insufficient documentation

## 2017-08-22 DIAGNOSIS — M25512 Pain in left shoulder: Secondary | ICD-10-CM

## 2017-08-22 DIAGNOSIS — R5383 Other fatigue: Secondary | ICD-10-CM | POA: Insufficient documentation

## 2017-08-22 DIAGNOSIS — R079 Chest pain, unspecified: Secondary | ICD-10-CM | POA: Diagnosis not present

## 2017-08-22 DIAGNOSIS — R509 Fever, unspecified: Secondary | ICD-10-CM | POA: Insufficient documentation

## 2017-08-22 DIAGNOSIS — M546 Pain in thoracic spine: Secondary | ICD-10-CM | POA: Insufficient documentation

## 2017-08-22 DIAGNOSIS — Z85118 Personal history of other malignant neoplasm of bronchus and lung: Secondary | ICD-10-CM | POA: Insufficient documentation

## 2017-08-22 DIAGNOSIS — R000 Tachycardia, unspecified: Secondary | ICD-10-CM | POA: Insufficient documentation

## 2017-08-22 DIAGNOSIS — I10 Essential (primary) hypertension: Secondary | ICD-10-CM | POA: Diagnosis not present

## 2017-08-22 DIAGNOSIS — Z79899 Other long term (current) drug therapy: Secondary | ICD-10-CM | POA: Diagnosis not present

## 2017-08-22 DIAGNOSIS — M791 Myalgia: Secondary | ICD-10-CM | POA: Insufficient documentation

## 2017-08-22 LAB — COMPREHENSIVE METABOLIC PANEL
ALK PHOS: 161 U/L — AB (ref 38–126)
ALT: 27 U/L (ref 17–63)
ANION GAP: 9 (ref 5–15)
AST: 27 U/L (ref 15–41)
Albumin: 3.5 g/dL (ref 3.5–5.0)
BILIRUBIN TOTAL: 0.9 mg/dL (ref 0.3–1.2)
BUN: 12 mg/dL (ref 6–20)
CALCIUM: 9 mg/dL (ref 8.9–10.3)
CO2: 28 mmol/L (ref 22–32)
CREATININE: 0.85 mg/dL (ref 0.61–1.24)
Chloride: 98 mmol/L — ABNORMAL LOW (ref 101–111)
GFR calc non Af Amer: >60 mL/min (ref >60)
GLUCOSE: 112 mg/dL — AB (ref 65–99)
Potassium: 3.8 mmol/L (ref 3.5–5.1)
Sodium: 135 mmol/L (ref 135–145)
TOTAL PROTEIN: 8.1 g/dL (ref 6.5–8.1)

## 2017-08-22 LAB — CBC WITH DIFFERENTIAL/PLATELET
Basophils Absolute: 0 10*3/uL (ref 0.0–0.1)
Basophils Relative: 0 %
Eosinophils Absolute: 0 10*3/uL (ref 0.0–0.7)
Eosinophils Relative: 0 %
HEMATOCRIT: 47 % (ref 39.0–52.0)
HEMOGLOBIN: 15.8 g/dL (ref 13.0–17.0)
LYMPHS ABS: 1.6 10*3/uL (ref 0.7–4.0)
LYMPHS PCT: 16 %
MCH: 32.4 pg (ref 26.0–34.0)
MCHC: 33.6 g/dL (ref 30.0–36.0)
MCV: 96.3 fL (ref 78.0–100.0)
MONOS PCT: 9 %
Monocytes Absolute: 0.9 10*3/uL (ref 0.1–1.0)
NEUTROS ABS: 7.2 10*3/uL (ref 1.7–7.7)
NEUTROS PCT: 75 %
Platelets: 165 10*3/uL (ref 150–400)
RBC: 4.88 MIL/uL (ref 4.22–5.81)
RDW: 15 % (ref 11.5–15.5)
WBC: 9.7 10*3/uL (ref 4.0–10.5)

## 2017-08-22 LAB — PHOSPHORUS: Phosphorus: 3.2 mg/dL (ref 2.5–4.6)

## 2017-08-22 LAB — TROPONIN I

## 2017-08-22 LAB — I-STAT CG4 LACTIC ACID, ED: LACTIC ACID, VENOUS: 0.74 mmol/L (ref 0.5–1.9)

## 2017-08-22 LAB — MAGNESIUM: MAGNESIUM: 1.9 mg/dL (ref 1.7–2.4)

## 2017-08-22 MED ORDER — IOPAMIDOL (ISOVUE-370) INJECTION 76%
100.0000 mL | Freq: Once | INTRAVENOUS | Status: AC | PRN
  Administered 2017-08-22: 100 mL via INTRAVENOUS

## 2017-08-22 MED ORDER — LEVOFLOXACIN IN D5W 750 MG/150ML IV SOLN
750.0000 mg | Freq: Once | INTRAVENOUS | Status: AC
  Administered 2017-08-22: 750 mg via INTRAVENOUS
  Filled 2017-08-22: qty 150

## 2017-08-22 MED ORDER — IPRATROPIUM-ALBUTEROL 0.5-2.5 (3) MG/3ML IN SOLN: 3.0000 mL | Freq: Three times a day (TID) | RESPIRATORY_TRACT | Status: DC

## 2017-08-22 MED ORDER — METHYLPREDNISOLONE SODIUM SUCC 125 MG IJ SOLR
125.0000 mg | Freq: Once | INTRAMUSCULAR | Status: AC
  Administered 2017-08-22: 125 mg via INTRAVENOUS
  Filled 2017-08-22: qty 2

## 2017-08-22 MED ORDER — ALBUTEROL SULFATE (2.5 MG/3ML) 0.083% IN NEBU
5.0000 mg | INHALATION_SOLUTION | Freq: Once | RESPIRATORY_TRACT | Status: AC
  Administered 2017-08-22: 5 mg via RESPIRATORY_TRACT
  Filled 2017-08-22: qty 6

## 2017-08-22 NOTE — ED Provider Notes (Signed)
Patient was seen by the hospitalist and refused admissions. Patient left AMA   Milton Ferguson, MD 08/22/17 [phone]

## 2017-08-22 NOTE — Telephone Encounter (Signed)
Patient called stating he was having left shoulder pain and wanted to be sure it would not interfere with his scheduled biopsy tomorrow. He states he cannot raise his arm or use it for anything. Explained to patient I could not say either way if the shoulder pain would interfere with his biopsy. It would be up to the MD doing the biopsy. Patient then states the pain is radiating down his left arm and up his neck into his jaw. nothing relieves the pain. Instructed patient to go to the ER to be sure nothing is going on with his heart. He states he does not want to go to the ER. Instructed patient to be safe he needs to see his PCP or go to ER. Patient states he will think about it.

## 2017-08-22 NOTE — ED Notes (Signed)
Hospitalist notified primary RN that pt requesting to leave AMA. Pt signed AMA form. Pt ambulated out of ED with steady gait. Pt given education regarding fever management and hydration.

## 2017-08-22 NOTE — ED Provider Notes (Signed)
[facility] DEPT Provider Note   CSN: [id number] Arrival date & time: 08/22/17  1306     History   Chief Complaint Chief Complaint  Patient presents with  . Shoulder Pain    HPI Erik [name] is a 56 y.o. male.  HPI Patient with history of lung cancer last chemotherapy treatment 3 weeks ago. Presents with left shoulder and left thoracic back pain worsening over the last 2 days. Has persistent cough and this developed subjective fevers and chills. No known trauma. Past Medical History:  Diagnosis Date  . Arnold-Chiari syndrome ([place])   . Arthritis   . Asthma   . Chronic back pain   . Depression   . GERD (gastroesophageal reflux disease)   . Hypertension   . Seasonal allergies   . Spinal stenosis of lumbar region   . 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 Wheezing     Patient Active Problem List   Diagnosis Date Noted  . Taking multiple medications for chronic disease 03/08/2017  . Radiation-induced esophagitis 02/02/2017  . Loss of weight 12/10/2016  . Encounter for screening colonoscopy 12/10/2016  . Esophageal dysphagia 12/10/2016  . Abdominal pain, epigastric 12/10/2016  . 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 GERD 01/13/2009  . ACTINIC KERATOSIS 01/13/2009  . ANXIETY 12/27/2008  . DEPRESSION 12/27/2008  . ASTHMA 12/27/2008  . OSTEOARTHRITIS 12/27/2008  . LOW BACK PAIN 12/27/2008    Past Surgical History:  Procedure Laterality Date  . BACK SURGERY     5 total  . BIOPSY  01/03/2017   Procedure: BIOPSY;  Surgeon: Daneil Dolin, MD;  [facility];  Service: Endoscopy;;  gastric  . COLONOSCOPY WITH PROPOFOL N/A 04/04/2017   Procedure: COLONOSCOPY WITH PROPOFOL;  Surgeon: Daneil Dolin, MD;  [facility];  Service: Endoscopy;  Laterality: N/A;  8:45am  . ESOPHAGOGASTRODUODENOSCOPY (EGD) WITH PROPOFOL N/A 01/03/2017   Procedure: ESOPHAGOGASTRODUODENOSCOPY (EGD) WITH PROPOFOL;  Surgeon: Daneil Dolin, MD;   [facility];  Service: Endoscopy;  Laterality: N/A;  . Venia Minks DILATION N/A 01/03/2017   Procedure: Venia Minks DILATION;  Surgeon: Daneil Dolin, MD;  [facility];  Service: Endoscopy;  Laterality: N/A;  . MULTIPLE EXTRACTIONS WITH ALVEOLOPLASTY N/A 07/08/2014   Procedure: MULTIPLE EXTRACION WITH ALVEOLOPLASTY with EXCISION LESION RIGHT SIDE OF TONGUE;  Surgeon: Gae Bon, DDS;  Location: [facility];  Service: Oral Surgery;  Laterality: N/A;  . PORTACATH PLACEMENT Right 07/30/2016   Procedure: INSERTION PORT-A-CATH;  Surgeon: Vickie Epley, MD;  Location: [facility];  Service: Vascular;  Laterality: Right;  [name] VIDEO BRONCHOSCOPY WITH ENDOBRONCHIAL ULTRASOUND N/A 07/19/2016   Procedure: VIDEO BRONCHOSCOPY WITH ENDOBRONCHIAL ULTRASOUND;  Surgeon: [name], MD;  Location: [facility];  Service: Thoracic;  Laterality: N/A;       Home Medications    Prior to Admission medications   Medication Sig Start Date End Date Taking? Authorizing Provider  albuterol (PROVENTIL HFA;VENTOLIN HFA) 108 (90 BASE) MCG/ACT inhaler Inhale 2 puffs into the lungs every 6 (six) hours as needed for wheezing.    Yes [provider]  ALPRAZolam Duanne Moron) 1 MG tablet Take 1 mg by mouth 2 (two) times daily as needed for anxiety or sleep (takes 1 tablet and bedtime for sleep and 1 dose during the day only if needed).  06/26/16  Yes [provider]  amLODipine (NORVASC) 5 MG tablet Take 5 mg by mouth daily.  07/07/16  Yes [provider]  Artificial Tear Solution (SOOTHE XP) SOLN Apply 2 drops to eye daily.   Yes [provider]  budesonide-formoterol (SYMBICORT) 160-4.5 MCG/ACT inhaler Inhale 2 puffs into the lungs daily.    Yes [provider]  escitalopram (LEXAPRO) 20 MG tablet Take 20 mg by mouth daily.  07/07/16  Yes [provider]  gabapentin (NEURONTIN) 300 MG capsule Take 600 mg by mouth 3 (three) times daily.    Yes [provider]    guaiFENesin (MUCINEX) 600 MG 12 hr tablet Take 600 mg by mouth 2 (two) times daily as needed (for congestion.).   Yes [provider]  lidocaine (XYLOCAINE) 2 % solution Use as directed 20 mLs in the mouth or throat every 6 (six) hours as needed (for mouth pain (used prior to eating when needed)).   Yes [provider]  lidocaine-prilocaine (EMLA) cream Apply a quarter size amount to port site 1 hour prior to chemo. Do not rub in. Cover with plastic wrap. 08/02/16  Yes Kefalas, Manon Hilding, PA-C  methadone (DOLOPHINE) 10 MG tablet Take 20 mg by mouth 5 (five) times daily. Takes 2 tablets 5 times daily 06/26/16  Yes [provider]  mirtazapine (REMERON) 15 MG tablet Take 1 tablet by mouth at bedtime. 08/17/17  Yes [provider]  Multiple Vitamin (MULTIVITAMIN WITH MINERALS) TABS tablet Take 1 tablet by mouth daily. Centrum   Yes [provider]  ondansetron (ZOFRAN) 8 MG tablet TAKE 1 TABLET BY MOUTH EVERY 8 HOURS AS NEEDED FOR NAUSEA AND VOMITING. 04/21/17  Yes Kefalas, Manon Hilding, PA-C  pantoprazole (PROTONIX) 40 MG tablet TAKE (1) TABLET BY MOUTH TWICE DAILY. 06/20/17  Yes Holley Bouche, NP  prochlorperazine (COMPAZINE) 10 MG tablet TAKE 1 TABLET EVERY 6 HOURS AS NEEDED FOR NAUSEA AND VOMITING. 08/18/17  Yes Holley Bouche, NP  tiZANidine (ZANAFLEX) 4 MG tablet Take 4 mg by mouth every 8 (eight) hours as needed for muscle spasms.  06/03/16  Yes [provider]  Atezolizumab (TECENTRIQ IV) Inject into the vein. Every 3 weeks    [provider]  Na Sulfate-K Sulfate-Mg Sulf (SUPREP BOWEL PREP KIT) 17.5-3.13-1.6 GM/180ML SOLN Take 1 kit by mouth as directed. Patient not taking: Reported on 08/22/2017 02/02/17   Daneil Dolin, MD    Family History Family History  Problem Relation Age of Onset  . Cancer Mother        breast cancer  . Heart failure Brother   . Colon cancer Neg Hx        not sure, ?grandfather and/or uncle    Social  History Social History  Substance Use Topics  . Smoking status: Current Every Day Smoker    Packs/day: 0.25    Years: 38.00    Types: Cigarettes  . Smokeless tobacco: Former Systems developer    Types: Snuff     Comment: using chantix- smoking once in a while  . Alcohol use 1.2 oz/week    2 Cans of beer per week     Comment: reports drinking 0-2 beers a week     Allergies   Demeclocycline and Tetracyclines & related   Review of Systems Review of Systems  Constitutional: Positive for chills, fatigue and fever.  Respiratory: Positive for cough and shortness of breath.   Cardiovascular: Positive for chest pain. Negative for palpitations and leg swelling.  Gastrointestinal: Negative for abdominal pain, constipation, diarrhea, nausea and vomiting.  Musculoskeletal: Positive for arthralgias, back pain and myalgias. Negative  for neck pain and neck stiffness.  Skin: Negative for rash and wound.  Neurological: Negative for dizziness, weakness, light-headedness, numbness and headaches.  All other systems reviewed and are negative.    Physical Exam Updated Vital Signs BP (!) 147/93   Pulse (!) 101   Temp 99.1 F (37.3 C) (Oral)   Resp 19   Ht 6' (1.829 m)   Wt 98 kg (216 lb)   SpO2 93%   BMI 29.29 kg/m   Physical Exam  Constitutional: He is oriented to person, place, and time. He appears well-developed and well-nourished. No distress.  HENT:  Head: Normocephalic and atraumatic.  Mouth/Throat: Oropharynx is clear and moist. No oropharyngeal exudate.  Eyes: Pupils are equal, round, and reactive to light. EOM are normal.  Neck: Normal range of motion. Neck supple.  Cardiovascular: Normal rate and regular rhythm.  Exam reveals no gallop and no friction rub.   No murmur heard. Pulmonary/Chest: Effort normal. He has wheezes.  Expiratory wheezing throughout with bilateral rhonchi in bases.  Abdominal: Soft. Bowel sounds are normal. There is no tenderness. There is no rebound and no  guarding.  Musculoskeletal: Normal range of motion. He exhibits tenderness. He exhibits no edema.  Patient with tenderness to palpation over the left lateral deltoid and distal clavicle. Pain with range of motion. Patient has mild thoracic back pain especially on the left. No midline tenderness. No lower extremity swelling, asymmetry or tenderness.  Neurological: He is alert and oriented to person, place, and time.  5/5 motor in all extremities. Patient has some limitations in movement of the left upper extremity due to pain. Sensation is grossly intact.  Skin: Skin is warm and dry. Capillary refill takes less than 2 seconds. No rash noted. No erythema.  Psychiatric: He has a normal mood and affect. His behavior is normal.  Nursing note and vitals reviewed.    ED Treatments / Results  Labs (all labs ordered are listed, but only abnormal results are displayed) Labs Reviewed  COMPREHENSIVE METABOLIC PANEL - Abnormal; Notable for the following:       Result Value   Chloride 98 (*)    Glucose, Bld 112 (*)    Alkaline Phosphatase 161 (*)    All other components within normal limits  CULTURE, BLOOD (ROUTINE X 2)  CULTURE, BLOOD (ROUTINE X 2)  CBC WITH DIFFERENTIAL/PLATELET  TROPONIN I  MAGNESIUM  PHOSPHORUS  I-STAT CG4 LACTIC ACID, ED    EKG  EKG Interpretation None       Radiology Dg Chest 2 View  Result Date: 08/22/2017 CLINICAL DATA:  Lt upper chest and shoulder pain. Lung ca. EXAM: CHEST  2 VIEW COMPARISON:  07/15/2017 and older exams. FINDINGS: Opacity the right lung base obscuring hemidiaphragm right heart border, with associated volume loss, is without significant change from prior study. Left lung is hyperexpanded, also stable. Remainder of the lungs is clear. Right pleural effusion is similar to the prior exam. No left pleural effusion. No pneumothorax. Cardiac silhouette is normal in size. No mediastinal or left hilar masses or adenopathy. Right hilum is less well  visualized due to the contiguous lower lung zone opacity. Its appearance is stable from prior study. Right anterior chest wall, internal jugular, far Port-A-Cath is stable, tip projecting in the lower superior vena cava. Skeletal structures are demineralized. No osteoblastic or osteolytic lesions. IMPRESSION: 1. No acute cardiopulmonary disease. 2. Stable treatment related changes at the right lung base with a chronic right pleural effusion. Electronically Signed  By: Lajean Manes M.D.   On: 08/22/2017 13:48   Ct Angio Chest Pe W And/or Wo Contrast  Result Date: 08/22/2017 CLINICAL DATA:  Left chest and shoulder pain, cough, and intermittent fevers for several days. Currently undergoing chemotherapy for lung carcinoma. EXAM: CT ANGIOGRAPHY CHEST WITH CONTRAST TECHNIQUE: Multidetector CT imaging of the chest was performed using the standard protocol during bolus administration of intravenous contrast. Multiplanar CT image reconstructions and MIPs were obtained to evaluate the vascular anatomy. CONTRAST:  100 mL Isovue 370 COMPARISON:  07/14/2017 FINDINGS: Cardiovascular: Satisfactory opacification of pulmonary arteries noted, and no pulmonary emboli identified. No evidence of thoracic aortic dissection or aneurysm. Mediastinum/Nodes: No significant change in mild left supraclavicular lymphadenopathy, with largest lymph node measuring 12 mm on image 10/5. Mild mediastinal lymphadenopathy in the left prevascular space also without significant change, with largest lymph node measuring 8 mm on image 42/5. No new or increased lymphadenopathy identified. Lungs/Pleura: Moderate right pleural effusion is mildly decreased in size since previous study. Right lung paramediastinal radiation changes are again noted. 6 mm anterior left upper lobe pulmonary nodule on image 3 4/7 remains stable. No new or enlarging pulmonary nodules identified. No evidence of acute infiltrate. Upper abdomen: No acute findings. Musculoskeletal:  No suspicious bone lesions identified. Review of the MIP images confirms the above findings. IMPRESSION: No evidence of pulmonary embolism or other acute findings. Stable mild left supraclavicular and superior mediastinal lymphadenopathy. Stable right lung paramediastinal radiation changes. Mild decrease in size of right pleural effusion. Electronically Signed   By: Earle Gell M.D.   On: 08/22/2017 16:35   Dg Shoulder Left  Result Date: 08/22/2017 CLINICAL DATA:  Severe lt shoulder pain and decreased rom x 2-3 days. He said that he reached to unplug a drop cord about 2 weeks ago and started hurting a few days later. EXAM: LEFT SHOULDER - 2+ VIEW COMPARISON:  None. FINDINGS: No fracture or bone lesion. There is narrowing of the inferior aspect of the glenohumeral joint with small marginal osteophytes from the inferior margin of the humeral head. There are mild AC joint osteoarthritic changes. Soft tissues are unremarkable. IMPRESSION: 1. No fracture, dislocation or acute finding. 2. Glenohumeral joint arthropathic changes. Mild AC joint osteoarthritis. Electronically Signed   By: Lajean Manes M.D.   On: 08/22/2017 13:45    Procedures Procedures (including critical care time)  Medications Ordered in ED Medications  albuterol (PROVENTIL) (2.5 MG/3ML) 0.083% nebulizer solution 5 mg (5 mg Nebulization Given 08/22/17 1623)  methylPREDNISolone sodium succinate (SOLU-MEDROL) 125 mg/2 mL injection 125 mg (125 mg Intravenous Given 08/22/17 1558)  levofloxacin (LEVAQUIN) IVPB 750 mg (0 mg Intravenous Stopped 08/22/17 1726)  iopamidol (ISOVUE-370) 76 % injection 100 mL (100 mLs Intravenous Contrast Given 08/22/17 1611)     Initial Impression / Assessment and Plan / ED Course  I have reviewed the triage vital signs and the nursing notes.  Pertinent labs & imaging results that were available during my care of the patient were reviewed by me and considered in my medical decision making (see chart for  details).    Patient with low-grade fever, tachycardia and hypoxia on presentation. CT without obvious PE. Concern for respiratory infection. Started on IV antibiotics. Discussed with hospitalist who will see patient in the emergency department.  Final Clinical Impressions(s) / ED Diagnoses   Final diagnoses:  None    New Prescriptions Discharge Medication List as of 08/22/2017  5:27 PM       Julianne Rice, MD 08/23/17  0736  

## 2017-08-22 NOTE — ED Triage Notes (Signed)
Pt reports left shoulder pain after reaching to unplug cord several days ago.   Pt also reports continued cough and intermittent fevers since last night. Pt reports is currently undergoing chemo treatments for lung cancer. Pt reports last treatment x3 months ago. Pt reports is starting new chemo regimen this Thursday. Pt alert, airway patent.

## 2017-08-23 ENCOUNTER — Ambulatory Visit (HOSPITAL_COMMUNITY)
Admission: RE | Admit: 2017-08-23 | Discharge: 2017-08-23 | Disposition: A | Discharge status: Home / Self Care | Payer: Medicare Other | Source: Ambulatory Visit | Attending: Oncology | Admitting: Oncology

## 2017-08-23 ENCOUNTER — Encounter (HOSPITAL_COMMUNITY): Payer: Self-pay

## 2017-08-23 ENCOUNTER — Other Ambulatory Visit (HOSPITAL_COMMUNITY): Payer: Self-pay | Admitting: Oncology

## 2017-08-23 DIAGNOSIS — M549 Dorsalgia, unspecified: Secondary | ICD-10-CM | POA: Insufficient documentation

## 2017-08-23 DIAGNOSIS — J45909 Unspecified asthma, uncomplicated: Secondary | ICD-10-CM | POA: Insufficient documentation

## 2017-08-23 DIAGNOSIS — I10 Essential (primary) hypertension: Secondary | ICD-10-CM | POA: Diagnosis not present

## 2017-08-23 DIAGNOSIS — K219 Gastro-esophageal reflux disease without esophagitis: Secondary | ICD-10-CM | POA: Insufficient documentation

## 2017-08-23 DIAGNOSIS — J90 Pleural effusion, not elsewhere classified: Secondary | ICD-10-CM | POA: Diagnosis not present

## 2017-08-23 DIAGNOSIS — C3491 Malignant neoplasm of unspecified part of right bronchus or lung: Secondary | ICD-10-CM

## 2017-08-23 DIAGNOSIS — R590 Localized enlarged lymph nodes: Secondary | ICD-10-CM | POA: Diagnosis not present

## 2017-08-23 DIAGNOSIS — F329 Major depressive disorder, single episode, unspecified: Secondary | ICD-10-CM | POA: Diagnosis not present

## 2017-08-23 DIAGNOSIS — F17210 Nicotine dependence, cigarettes, uncomplicated: Secondary | ICD-10-CM | POA: Insufficient documentation

## 2017-08-23 DIAGNOSIS — G8929 Other chronic pain: Secondary | ICD-10-CM | POA: Insufficient documentation

## 2017-08-23 DIAGNOSIS — M1990 Unspecified osteoarthritis, unspecified site: Secondary | ICD-10-CM | POA: Insufficient documentation

## 2017-08-23 DIAGNOSIS — M48061 Spinal stenosis, lumbar region without neurogenic claudication: Secondary | ICD-10-CM | POA: Diagnosis not present

## 2017-08-23 HISTORY — DX: Dyspnea, unspecified: R06.00

## 2017-08-23 LAB — PROTIME-INR
INR: 1.22
Prothrombin Time: 15.3 seconds — ABNORMAL HIGH (ref 11.4–15.2)

## 2017-08-23 LAB — CBC
HEMATOCRIT: 40.6 % (ref 39.0–52.0)
Hemoglobin: 13.7 g/dL (ref 13.0–17.0)
MCH: 31.9 pg (ref 26.0–34.0)
MCHC: 33.7 g/dL (ref 30.0–36.0)
MCV: 94.6 fL (ref 78.0–100.0)
Platelets: 161 10*3/uL (ref 150–400)
RBC: 4.29 MIL/uL (ref 4.22–5.81)
RDW: 14.7 % (ref 11.5–15.5)
WBC: 12.2 10*3/uL — ABNORMAL HIGH (ref 4.0–10.5)

## 2017-08-23 LAB — APTT: aPTT: 38 seconds — ABNORMAL HIGH (ref 24–36)

## 2017-08-23 MED ORDER — SODIUM CHLORIDE 0.9 % IV SOLN
INTRAVENOUS | Status: DC
  Administered 2017-08-23: 12:00:00 via INTRAVENOUS

## 2017-08-23 MED ORDER — HEPARIN SOD (PORK) LOCK FLUSH 100 UNIT/ML IV SOLN
500.0000 [IU] | INTRAVENOUS | Status: AC | PRN
  Administered 2017-08-23: 500 [IU]
  Filled 2017-08-23: qty 5

## 2017-08-23 MED ORDER — FENTANYL CITRATE (PF) 100 MCG/2ML IJ SOLN
INTRAMUSCULAR | Status: AC
  Filled 2017-08-23: qty 2

## 2017-08-23 MED ORDER — LIDOCAINE HCL 2 % IJ SOLN
INTRAMUSCULAR | Status: AC
  Filled 2017-08-23: qty 10

## 2017-08-23 MED ORDER — MIDAZOLAM HCL 2 MG/2ML IJ SOLN
INTRAMUSCULAR | Status: AC | PRN
  Administered 2017-08-23 (×2): 1 mg via INTRAVENOUS

## 2017-08-23 MED ORDER — MIDAZOLAM HCL 2 MG/2ML IJ SOLN
INTRAMUSCULAR | Status: AC
  Filled 2017-08-23: qty 2

## 2017-08-23 MED ORDER — FENTANYL CITRATE (PF) 100 MCG/2ML IJ SOLN
INTRAMUSCULAR | Status: AC | PRN
  Administered 2017-08-23 (×2): 50 ug via INTRAVENOUS

## 2017-08-23 MED ORDER — LIDOCAINE HCL (PF) 1 % IJ SOLN
INTRAMUSCULAR | Status: AC | PRN
  Administered 2017-08-23: 10 mL

## 2017-08-23 NOTE — H&P (Signed)
Chief Complaint: lung cancer, [facility] LN  Referring Physician:Dr. Twana First  Supervising Physician: Aletta Edouard  Patient Status: [facility] - Out-pt  HPI: Erik [name] is a 56 y.o. male with a history of lung cancer who has recently been found to have left supraclavicular lymphadenopathy.  A request has been made for a biopsy.  The patient did go to the ED yesterday secondary to low grade fevers, shoulder pain, and shortness of breath.  He had a CTA that was unrevealing.  He was placed on abx therapy for possible bronchitis.  Inpatient stay was recommended but he left AMA so he could come today for this procedure.  He states he is still having shoulder pain, but his SOB and low grade fevers are gone.  He has no other complaints.  His current vital signs reveal no fever, normal O2 sats, and otherwise normal vitals.  Past Medical History:  Past Medical History:  Diagnosis Date  . Arnold-Chiari syndrome ([place])   . Arthritis   . Asthma   . Chronic back pain   . Depression   . Dyspnea    with exertion   . GERD (gastroesophageal reflux disease)   . Hypertension   . Seasonal allergies   . Spinal stenosis of lumbar region   . 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 Wheezing     Past Surgical History:  Past Surgical History:  Procedure Laterality Date  . BACK SURGERY     5 total  . BIOPSY  01/03/2017   Procedure: BIOPSY;  Surgeon: Daneil Dolin, MD;  [facility];  Service: Endoscopy;;  gastric  . COLONOSCOPY WITH PROPOFOL N/A 04/04/2017   Procedure: COLONOSCOPY WITH PROPOFOL;  Surgeon: Daneil Dolin, MD;  [facility];  Service: Endoscopy;  Laterality: N/A;  8:45am  . ESOPHAGOGASTRODUODENOSCOPY (EGD) WITH PROPOFOL N/A 01/03/2017   Procedure: ESOPHAGOGASTRODUODENOSCOPY (EGD) WITH PROPOFOL;  Surgeon: Daneil Dolin, MD;  [facility];  Service: Endoscopy;  Laterality: N/A;  . Venia Minks DILATION N/A 01/03/2017   Procedure: Venia Minks DILATION;   Surgeon: Daneil Dolin, MD;  [facility];  Service: Endoscopy;  Laterality: N/A;  . MULTIPLE EXTRACTIONS WITH ALVEOLOPLASTY N/A 07/08/2014   Procedure: MULTIPLE EXTRACION WITH ALVEOLOPLASTY with EXCISION LESION RIGHT SIDE OF TONGUE;  Surgeon: Gae Bon, DDS;  Location: [place];  Service: Oral Surgery;  Laterality: N/A;  . PORTACATH PLACEMENT Right 07/30/2016   Procedure: INSERTION PORT-A-CATH;  Surgeon: Vickie Epley, MD;  Location: [facility];  Service: Vascular;  Laterality: Right;  [name] VIDEO BRONCHOSCOPY WITH ENDOBRONCHIAL ULTRASOUND N/A 07/19/2016   Procedure: VIDEO BRONCHOSCOPY WITH ENDOBRONCHIAL ULTRASOUND;  Surgeon: [name], MD;  Location: [facility];  Service: Thoracic;  Laterality: N/A;    Family History:  Family History  Problem Relation Age of Onset  . Cancer Mother        breast cancer  . Heart failure Brother   . Colon cancer Neg Hx        not sure, ?grandfather and/or uncle    Social History:  reports that he has been smoking Cigarettes.  He has a 9.50 pack-year smoking history. He has quit using smokeless tobacco. His smokeless tobacco use included Chew. He reports that he drinks about 1.2 oz of alcohol per week . He reports that he uses drugs, including Marijuana, about 7 times per week.  Allergies:  Allergies  Allergen Reactions  . Demeclocycline Other (See Comments) and  Swelling  . Tetracyclines & Related Swelling and Other (See Comments)         Medications: Medications reviewed in epic  Please HPI for pertinent positives, otherwise complete 10 system ROS negative.  Mallampati Score: MD Evaluation Airway: WNL Heart: WNL Abdomen: WNL Chest/ Lungs: Other (comments) Chest/ lungs comments: bilateral rhonchi ASA  Classification: 2 Mallampati/Airway Score: One  Physical Exam: BP 130/76   Pulse 92   Temp 98.2 F (36.8 C) (Oral)   Resp 18   Ht 6' (1.829 m)   Wt 211 lb (95.7 kg)   SpO2 95%   BMI 28.62 kg/m  Body mass index is 28.62  kg/m. General: pleasant, WD, WN white male who is laying in bed in NAD HEENT: head is normocephalic, atraumatic.  Sclera are noninjected.  PERRL.  Ears and nose without any masses or lesions.  Mouth is pink and moist Heart: regular, rate, and rhythm.  Normal s1,s2. No obvious murmurs, gallops, or rubs noted.  Palpable radial and pedal pulses bilaterally Lungs: diffuse rhonchi bilatearlly.  Respiratory effort nonlabored Abd: soft, NT, ND, +BS, no masses, hernias, or organomegaly Psych: A&Ox3 with an appropriate affect.   Labs: Results for orders placed or performed during the hospital encounter of 08/23/17 (from the past 48 hour(s))  APTT upon arrival     Status: Abnormal   Collection Time: 08/23/17 11:45 AM  Result Value Ref Range   aPTT 38 (H) 24 - 36 seconds    Comment:        IF BASELINE aPTT IS ELEVATED, SUGGEST PATIENT RISK ASSESSMENT BE USED TO DETERMINE APPROPRIATE ANTICOAGULANT THERAPY.   CBC upon arrival     Status: Abnormal   Collection Time: 08/23/17 11:45 AM  Result Value Ref Range   WBC 12.2 (H) 4.0 - 10.5 K/uL   RBC 4.29 4.22 - 5.81 MIL/uL   Hemoglobin 13.7 13.0 - 17.0 g/dL   HCT 40.6 39.0 - 52.0 %   MCV 94.6 78.0 - 100.0 fL   MCH 31.9 26.0 - 34.0 pg   MCHC 33.7 30.0 - 36.0 g/dL   RDW 14.7 11.5 - 15.5 %   Platelets 161 150 - 400 K/uL  Protime-INR upon arrival     Status: Abnormal   Collection Time: 08/23/17 11:45 AM  Result Value Ref Range   Prothrombin Time 15.3 (H) 11.4 - 15.2 seconds   INR 1.22     Imaging: Dg Chest 2 View  Result Date: 08/22/2017 CLINICAL DATA:  Lt upper chest and shoulder pain. Lung ca. EXAM: CHEST  2 VIEW COMPARISON:  07/15/2017 and older exams. FINDINGS: Opacity the right lung base obscuring hemidiaphragm right heart border, with associated volume loss, is without significant change from prior study. Left lung is hyperexpanded, also stable. Remainder of the lungs is clear. Right pleural effusion is similar to the prior exam. No left  pleural effusion. No pneumothorax. Cardiac silhouette is normal in size. No mediastinal or left hilar masses or adenopathy. Right hilum is less well visualized due to the contiguous lower lung zone opacity. Its appearance is stable from prior study. Right anterior chest wall, internal jugular, far Port-A-Cath is stable, tip projecting in the lower superior vena cava. Skeletal structures are demineralized. No osteoblastic or osteolytic lesions. IMPRESSION: 1. No acute cardiopulmonary disease. 2. Stable treatment related changes at the right lung base with a chronic right pleural effusion. Electronically Signed   By: Lajean Manes M.D.   On: 08/22/2017 13:48   Ct Angio Chest Pe W And/or  Wo Contrast  Result Date: 08/22/2017 CLINICAL DATA:  Left chest and shoulder pain, cough, and intermittent fevers for several days. Currently undergoing chemotherapy for lung carcinoma. EXAM: CT ANGIOGRAPHY CHEST WITH CONTRAST TECHNIQUE: Multidetector CT imaging of the chest was performed using the standard protocol during bolus administration of intravenous contrast. Multiplanar CT image reconstructions and MIPs were obtained to evaluate the vascular anatomy. CONTRAST:  100 mL Isovue 370 COMPARISON:  07/14/2017 FINDINGS: Cardiovascular: Satisfactory opacification of pulmonary arteries noted, and no pulmonary emboli identified. No evidence of thoracic aortic dissection or aneurysm. Mediastinum/Nodes: No significant change in mild left supraclavicular lymphadenopathy, with largest lymph node measuring 12 mm on image 10/5. Mild mediastinal lymphadenopathy in the left prevascular space also without significant change, with largest lymph node measuring 8 mm on image 42/5. No new or increased lymphadenopathy identified. Lungs/Pleura: Moderate right pleural effusion is mildly decreased in size since previous study. Right lung paramediastinal radiation changes are again noted. 6 mm anterior left upper lobe pulmonary nodule on image 3 4/7  remains stable. No new or enlarging pulmonary nodules identified. No evidence of acute infiltrate. Upper abdomen: No acute findings. Musculoskeletal: No suspicious bone lesions identified. Review of the MIP images confirms the above findings. IMPRESSION: No evidence of pulmonary embolism or other acute findings. Stable mild left supraclavicular and superior mediastinal lymphadenopathy. Stable right lung paramediastinal radiation changes. Mild decrease in size of right pleural effusion. Electronically Signed   By: Earle Gell M.D.   On: 08/22/2017 16:35   Dg Shoulder Left  Result Date: 08/22/2017 CLINICAL DATA:  Severe lt shoulder pain and decreased rom x 2-3 days. He said that he reached to unplug a drop cord about 2 weeks ago and started hurting a few days later. EXAM: LEFT SHOULDER - 2+ VIEW COMPARISON:  None. FINDINGS: No fracture or bone lesion. There is narrowing of the inferior aspect of the glenohumeral joint with small marginal osteophytes from the inferior margin of the humeral head. There are mild AC joint osteoarthritic changes. Soft tissues are unremarkable. IMPRESSION: 1. No fracture, dislocation or acute finding. 2. Glenohumeral joint arthropathic changes. Mild AC joint osteoarthritis. Electronically Signed   By: Lajean Manes M.D.   On: 08/22/2017 13:45    Assessment/Plan 1. Lung cancer with supraclavicular lymphadenopathy  We will proceed today with a LN BX.  He has diffuse rhonchi but his O2 sats are good on room air. I think he is safe to sedate for this procedure.  Risks and benefits discussed with the patient including, but not limited to bleeding, infection, damage to adjacent structures or low yield requiring additional tests. All of the patient's questions were answered, patient is agreeable to proceed. Consent signed and in chart.  Thank you for this interesting consult.  I greatly enjoyed meeting JACE FERMIN and look forward to participating in their care.  A copy of this  report was sent to the requesting provider on this date.  Electronically Signed: Henreitta Cea 08/23/2017, 12:43 PM   I spent a total of  30 Minutes   in face to face in clinical consultation, greater than 50% of which was counseling/coordinating care for supraclavicular lymphadenopathy

## 2017-08-23 NOTE — Discharge Instructions (Signed)
Moderate Conscious Sedation, Adult, Care After These instructions provide you with information about caring for yourself after your procedure. Your health care provider may also give you more specific instructions. Your treatment has been planned according to current medical practices, but problems sometimes occur. Call your health care provider if you have any problems or questions after your procedure. What can I expect after the procedure? After your procedure, it is common:  To feel sleepy for several hours.  To feel clumsy and have poor balance for several hours.  To have poor judgment for several hours.  To vomit if you eat too soon.  Follow these instructions at home: For at least 24 hours after the procedure:   Do not: ? Participate in activities where you could fall or become injured. ? Drive. ? Use heavy machinery. ? Drink alcohol. ? Take sleeping pills or medicines that cause drowsiness. ? Make important decisions or sign legal documents. ? Take care of children on your own.  Rest. Eating and drinking  Follow the diet recommended by your health care provider.  If you vomit: ? Drink water, juice, or soup when you can drink without vomiting. ? Make sure you have little or no nausea before eating solid foods. General instructions  Have a responsible adult stay with you until you are awake and alert.  Take over-the-counter and prescription medicines only as told by your health care provider.  If you smoke, do not smoke without supervision.  Keep all follow-up visits as told by your health care provider. This is important. Contact a health care provider if:  You keep feeling nauseous or you keep vomiting.  You feel light-headed.  You develop a rash.  You have a fever. Get help right away if:  You have trouble breathing. This information is not intended to replace advice given to you by your health care provider. Make sure you discuss any questions you have  with your health care provider. Document Released: 09/12/2013 Document Revised: 04/26/2016 Document Reviewed: 03/13/2016 Elsevier Interactive Patient Education  2018 [place].   Needle Biopsy, Care After Refer to this sheet in the next few weeks. These instructions provide you with information about caring for yourself after your procedure. Your health care provider may also give you more specific instructions. Your treatment has been planned according to current medical practices, but problems sometimes occur. Call your health care provider if you have any problems or questions after your procedure. What can I expect after the procedure? After your procedure, it is common to have soreness, bruising, or mild pain at the biopsy site. This should go away in a few days. Follow these instructions at home:  Rest as directed by your health care provider.  Take medicines only as directed by your health care provider.  There are many different ways to close and cover the biopsy site, including stitches (sutures), skin glue, and adhesive strips. Follow your health care provider's instructions about: ? Biopsy site care. ? Bandage (dressing) changes and removal.  Remove dressing tomorrow 08/24/17. ? Biopsy site closure removal.  Check your biopsy site every day for signs of infection. Watch for: ? Redness, swelling, or pain. ? Fluid, blood, or pus. Contact a health care provider if:  You have a fever.  You have redness, swelling, or pain at the biopsy site that lasts longer than a few days.  You have fluid, blood, or pus coming from the biopsy site.  You feel nauseous.  You vomit. Get help right  away if:  You have shortness of breath.  You have trouble breathing.  You have chest pain.  You feel dizzy or you faint.  You have bleeding that does not stop with pressure or a bandage.  You cough up blood.  You have pain in your abdomen. This information is not intended to replace  advice given to you by your health care provider. Make sure you discuss any questions you have with your health care provider. Document Released: 04/08/2015 Document Revised: 04/29/2016 Document Reviewed: 11/18/2014 Elsevier Interactive Patient Education  [employer].

## 2017-08-23 NOTE — Procedures (Signed)
Interventional Radiology Procedure Note  Procedure: US guided core biopsy of left supraclavicular lymph node  Complications: None  Estimated Blood Loss: < 10 mL  18 G core biopsy x 5 of largest left supraclavicular lymph node.  Samples sent in formalin and saline.  [name]. Kathlene Cote, M.D Pager:  [phone]

## 2017-08-25 ENCOUNTER — Encounter (HOSPITAL_BASED_OUTPATIENT_CLINIC_OR_DEPARTMENT_OTHER): Payer: Medicare Other

## 2017-08-25 ENCOUNTER — Encounter (HOSPITAL_COMMUNITY): Payer: Self-pay

## 2017-08-25 VITALS — BP 131/70 | HR 92 | Temp 98.3°F | Resp 18 | Wt 213.6 lb

## 2017-08-25 DIAGNOSIS — C3431 Malignant neoplasm of lower lobe, right bronchus or lung: Secondary | ICD-10-CM | POA: Diagnosis not present

## 2017-08-25 DIAGNOSIS — Z5112 Encounter for antineoplastic immunotherapy: Secondary | ICD-10-CM

## 2017-08-25 DIAGNOSIS — C3491 Malignant neoplasm of unspecified part of right bronchus or lung: Secondary | ICD-10-CM

## 2017-08-25 DIAGNOSIS — C799 Secondary malignant neoplasm of unspecified site: Secondary | ICD-10-CM

## 2017-08-25 DIAGNOSIS — D751 Secondary polycythemia: Secondary | ICD-10-CM | POA: Diagnosis not present

## 2017-08-25 LAB — CBC WITH DIFFERENTIAL/PLATELET
BASOS ABS: 0 10*3/uL (ref 0.0–0.1)
Basophils Relative: 0 %
EOS ABS: 0.1 10*3/uL (ref 0.0–0.7)
EOS PCT: 1 %
HCT: 42.8 % (ref 39.0–52.0)
Hemoglobin: 14.4 g/dL (ref 13.0–17.0)
Lymphocytes Relative: 23 %
Lymphs Abs: 1.5 10*3/uL (ref 0.7–4.0)
MCH: 32.4 pg (ref 26.0–34.0)
MCHC: 33.6 g/dL (ref 30.0–36.0)
MCV: 96.2 fL (ref 78.0–100.0)
MONO ABS: 0.5 10*3/uL (ref 0.1–1.0)
Monocytes Relative: 8 %
Neutro Abs: 4.4 10*3/uL (ref 1.7–7.7)
Neutrophils Relative %: 68 %
PLATELETS: 173 10*3/uL (ref 150–400)
RBC: 4.45 MIL/uL (ref 4.22–5.81)
RDW: 14.7 % (ref 11.5–15.5)
WBC: 6.5 10*3/uL (ref 4.0–10.5)

## 2017-08-25 LAB — COMPREHENSIVE METABOLIC PANEL
ALT: 40 U/L (ref 17–63)
AST: 51 U/L — AB (ref 15–41)
Albumin: 3.2 g/dL — ABNORMAL LOW (ref 3.5–5.0)
Alkaline Phosphatase: 149 U/L — ABNORMAL HIGH (ref 38–126)
Anion gap: 7 (ref 5–15)
BUN: 11 mg/dL (ref 6–20)
CHLORIDE: 99 mmol/L — AB (ref 101–111)
CO2: 29 mmol/L (ref 22–32)
Calcium: 8.7 mg/dL — ABNORMAL LOW (ref 8.9–10.3)
Creatinine, Ser: 0.8 mg/dL (ref 0.61–1.24)
GFR calc non Af Amer: >60 mL/min (ref >60)
Glucose, Bld: 99 mg/dL (ref 65–99)
POTASSIUM: 3.8 mmol/L (ref 3.5–5.1)
Sodium: 135 mmol/L (ref 135–145)
TOTAL PROTEIN: 7.2 g/dL (ref 6.5–8.1)
Total Bilirubin: 0.7 mg/dL (ref 0.3–1.2)

## 2017-08-25 LAB — TSH: TSH: 2.645 u[IU]/mL (ref 0.350–4.500)

## 2017-08-25 MED ORDER — SODIUM CHLORIDE 0.9 % IV SOLN
1200.0000 mg | Freq: Once | INTRAVENOUS | Status: AC
  Administered 2017-08-25: 1200 mg via INTRAVENOUS
  Filled 2017-08-25: qty 20

## 2017-08-25 MED ORDER — HEPARIN SOD (PORK) LOCK FLUSH 100 UNIT/ML IV SOLN
500.0000 [IU] | Freq: Once | INTRAVENOUS | Status: AC | PRN
  Administered 2017-08-25: 500 [IU]
  Filled 2017-08-25: qty 5

## 2017-08-25 MED ORDER — HEPARIN SOD (PORK) LOCK FLUSH 100 UNIT/ML IV SOLN
INTRAVENOUS | Status: AC
  Filled 2017-08-25: qty 10

## 2017-08-25 MED ORDER — SODIUM CHLORIDE 0.9% FLUSH
10.0000 mL | INTRAVENOUS | Status: DC | PRN
  Administered 2017-08-25: 10 mL
  Filled 2017-08-25: qty 10

## 2017-08-25 MED ORDER — SODIUM CHLORIDE 0.9 % IV SOLN
Freq: Once | INTRAVENOUS | Status: AC
  Administered 2017-08-25: 12:00:00 via INTRAVENOUS

## 2017-08-25 NOTE — Patient Instructions (Signed)
[facility] Discharge Instructions for Patients Receiving Chemotherapy   Beginning January 23rd 2017 lab work for the [facility] will be done in the  Main lab at [facility] on 1st floor. If you have a lab appointment with the [facility] please come in thru the  Main Entrance and check in at the main information desk   Today you received the following chemotherapy agents Tecentriq. Follow-up as scheduled.Call clinic for any questions or concerns  To help prevent nausea and vomiting after your treatment, we encourage you to take your nausea medication   If you develop nausea and vomiting, or diarrhea that is not controlled by your medication, call the clinic.  The clinic phone number is [phone]. Office hours are Monday-Friday 8:30am-5:00pm.  BELOW ARE SYMPTOMS THAT SHOULD BE REPORTED IMMEDIATELY:  *FEVER GREATER THAN 101.0 F  *CHILLS WITH OR WITHOUT FEVER  NAUSEA AND VOMITING THAT IS NOT CONTROLLED WITH YOUR NAUSEA MEDICATION  *UNUSUAL SHORTNESS OF BREATH  *UNUSUAL BRUISING OR BLEEDING  TENDERNESS IN MOUTH AND THROAT WITH OR WITHOUT PRESENCE OF ULCERS  *URINARY PROBLEMS  *BOWEL PROBLEMS  UNUSUAL RASH Items with * indicate a potential emergency and should be followed up as soon as possible. If you have an emergency after office hours please contact your primary care physician or go to the nearest emergency department.  Please call the clinic during office hours if you have any questions or concerns.   You may also contact the Patient Navigator at [phone] should you have any questions or need assistance in obtaining follow up care.      Resources For Cancer Patients and their Caregivers ? American Cancer Society: Can assist with transportation, wigs, general needs, runs Look Good Feel Better.        661-654-1447 ? Cancer Care: Provides financial assistance, online support groups, medication/co-pay assistance.  1-800-813-HOPE  540 033 1187) ? [facility] Assists [place] cancer patients and their families through emotional , educational and financial support.  361 831 0459 ? [place] DSS Where to apply for food stamps, Medicaid and utility assistance. 940-614-8586 ? [facility]: Transportation to medical appointments. 502-146-8037 ? Social Security Administration: May apply for disability if have a Stage IV cancer. ([phone] 951-031-8635 ? [employer], Disability and Transit Services: Assists with nutrition, care and transit needs. 2672020406

## 2017-08-25 NOTE — Progress Notes (Signed)
Erik [name] tolerated Tecentriq infusion well without complaints or incident.Labs reviewed with Dr. Talbert Cage prior to administering this medication. VSS upon discharge. Pt discharged self ambulatory in satisfactory condition

## 2017-08-27 ENCOUNTER — Other Ambulatory Visit (HOSPITAL_COMMUNITY): Payer: Self-pay | Admitting: Oncology

## 2017-08-27 DIAGNOSIS — C3491 Malignant neoplasm of unspecified part of right bronchus or lung: Secondary | ICD-10-CM

## 2017-08-27 LAB — CULTURE, BLOOD (ROUTINE X 2)
CULTURE: NO GROWTH
CULTURE: NO GROWTH
Special Requests: ADEQUATE

## 2017-08-31 ENCOUNTER — Other Ambulatory Visit (HOSPITAL_COMMUNITY): Payer: Self-pay | Admitting: Oncology

## 2017-08-31 ENCOUNTER — Telehealth (HOSPITAL_COMMUNITY): Payer: Self-pay | Admitting: Oncology

## 2017-08-31 ENCOUNTER — Telehealth (HOSPITAL_COMMUNITY): Payer: Self-pay

## 2017-08-31 NOTE — Telephone Encounter (Signed)
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 All of his questions were answered to his satisfaction. Continue treatment with tecentriq as planned.

## 2017-08-31 NOTE — Telephone Encounter (Signed)
Patient called requesting results of recent biopsy. Explained to patient I would get the provider to review them and call him back. He verbalized understanding.

## 2017-09-15 ENCOUNTER — Encounter (HOSPITAL_BASED_OUTPATIENT_CLINIC_OR_DEPARTMENT_OTHER): Payer: Medicare Other

## 2017-09-15 ENCOUNTER — Encounter (HOSPITAL_COMMUNITY): Payer: Self-pay | Admitting: Oncology

## 2017-09-15 ENCOUNTER — Encounter (HOSPITAL_COMMUNITY): Payer: Medicare Other | Attending: Hematology | Admitting: Oncology

## 2017-09-15 VITALS — BP 114/72 | HR 95 | Temp 98.0°F | Resp 18

## 2017-09-15 VITALS — BP 100/72 | HR 100 | Temp 98.7°F | Resp 18 | Wt 224.0 lb

## 2017-09-15 DIAGNOSIS — Z5112 Encounter for antineoplastic immunotherapy: Secondary | ICD-10-CM

## 2017-09-15 DIAGNOSIS — Z923 Personal history of irradiation: Secondary | ICD-10-CM | POA: Diagnosis not present

## 2017-09-15 DIAGNOSIS — D751 Secondary polycythemia: Secondary | ICD-10-CM | POA: Diagnosis present

## 2017-09-15 DIAGNOSIS — C3491 Malignant neoplasm of unspecified part of right bronchus or lung: Secondary | ICD-10-CM

## 2017-09-15 DIAGNOSIS — C770 Secondary and unspecified malignant neoplasm of lymph nodes of head, face and neck: Secondary | ICD-10-CM

## 2017-09-15 DIAGNOSIS — Z9221 Personal history of antineoplastic chemotherapy: Secondary | ICD-10-CM

## 2017-09-15 DIAGNOSIS — J329 Chronic sinusitis, unspecified: Secondary | ICD-10-CM | POA: Diagnosis not present

## 2017-09-15 LAB — CBC WITH DIFFERENTIAL/PLATELET
Basophils Absolute: 0 10*3/uL (ref 0.0–0.1)
Basophils Relative: 0 %
EOS PCT: 1 %
Eosinophils Absolute: 0.1 10*3/uL (ref 0.0–0.7)
HEMATOCRIT: 42.8 % (ref 39.0–52.0)
HEMOGLOBIN: 14.2 g/dL (ref 13.0–17.0)
LYMPHS ABS: 1.4 10*3/uL (ref 0.7–4.0)
LYMPHS PCT: 22 %
MCH: 32 pg (ref 26.0–34.0)
MCHC: 33.2 g/dL (ref 30.0–36.0)
MCV: 96.4 fL (ref 78.0–100.0)
Monocytes Absolute: 0.5 10*3/uL (ref 0.1–1.0)
Monocytes Relative: 8 %
NEUTROS PCT: 69 %
Neutro Abs: 4.3 10*3/uL (ref 1.7–7.7)
Platelets: 146 10*3/uL — ABNORMAL LOW (ref 150–400)
RBC: 4.44 MIL/uL (ref 4.22–5.81)
RDW: 15.5 % (ref 11.5–15.5)
WBC: 6.3 10*3/uL (ref 4.0–10.5)

## 2017-09-15 LAB — COMPREHENSIVE METABOLIC PANEL
ALT: 26 U/L (ref 17–63)
AST: 39 U/L (ref 15–41)
Albumin: 3.2 g/dL — ABNORMAL LOW (ref 3.5–5.0)
Alkaline Phosphatase: 163 U/L — ABNORMAL HIGH (ref 38–126)
Anion gap: 7 (ref 5–15)
BUN: 9 mg/dL (ref 6–20)
CO2: 31 mmol/L (ref 22–32)
CREATININE: 0.73 mg/dL (ref 0.61–1.24)
Calcium: 8.4 mg/dL — ABNORMAL LOW (ref 8.9–10.3)
Chloride: 96 mmol/L — ABNORMAL LOW (ref 101–111)
Glucose, Bld: 91 mg/dL (ref 65–99)
Potassium: 3.9 mmol/L (ref 3.5–5.1)
Sodium: 134 mmol/L — ABNORMAL LOW (ref 135–145)
Total Bilirubin: 0.8 mg/dL (ref 0.3–1.2)
Total Protein: 7.3 g/dL (ref 6.5–8.1)

## 2017-09-15 MED ORDER — HEPARIN SOD (PORK) LOCK FLUSH 100 UNIT/ML IV SOLN
500.0000 [IU] | Freq: Once | INTRAVENOUS | Status: AC | PRN
  Administered 2017-09-15: 500 [IU]
  Filled 2017-09-15: qty 5

## 2017-09-15 MED ORDER — SODIUM CHLORIDE 0.9 % IV SOLN
Freq: Once | INTRAVENOUS | Status: AC
  Administered 2017-09-15: 12:00:00 via INTRAVENOUS

## 2017-09-15 MED ORDER — AMOXICILLIN-POT CLAVULANATE 875-125 MG PO TABS: 1.0000 | ORAL_TABLET | Freq: Two times a day (BID) | ORAL | 0 refills | Status: AC

## 2017-09-15 MED ORDER — SODIUM CHLORIDE 0.9% FLUSH
10.0000 mL | INTRAVENOUS | Status: DC | PRN
  Administered 2017-09-15: 10 mL
  Filled 2017-09-15: qty 10

## 2017-09-15 MED ORDER — SODIUM CHLORIDE 0.9 % IV SOLN
1200.0000 mg | Freq: Once | INTRAVENOUS | Status: AC
  Administered 2017-09-15: 1200 mg via INTRAVENOUS
  Filled 2017-09-15: qty 20

## 2017-09-15 NOTE — Progress Notes (Signed)
Erik Gravel, MD [address] [place] [address]  Adenocarcinoma of right lung [facility]) - Plan: CT Chest W Contrast, CT Abdomen Pelvis W Contrast  CURRENT THERAPY: Tecentriq beginning on 08/04/2017  INTERVAL HISTORY: Erik [name] 56 y.o. male returns for followup of progressive non-small cell lung cancer, adenocarcinoma type. Patient presents today for cycle 3 of tecentriq and follow up. He states he has been tolerating tecentriq well except that he would have an episode of N/V in the mornings during the first week of treatment. He is using his antiemetics BID. He states his appetite is fair at 50%. He feels fatigued. He has been having sinus congestion with yellow nasal discharge. No associated fevers/chills. He continues to smoke 1/4 ppd.   Review of Systems  Constitutional: Negative.  Negative for chills, fever and weight loss.  HENT: Positive for congestion.   Eyes: Negative.   Respiratory: Positive for shortness of breath. Negative for cough.   Cardiovascular: Negative.  Negative for chest pain.  Gastrointestinal: Negative.  Negative for blood in stool, constipation, diarrhea, melena, nausea and vomiting.  Genitourinary: Negative.   Musculoskeletal: Negative.   Skin: Negative.   Neurological: Negative.  Negative for weakness.  Endo/Heme/Allergies: Negative.   Psychiatric/Behavioral: Negative.     Past Medical History:  Diagnosis Date  . Arnold-Chiari syndrome ([place])   . Arthritis   . Asthma   . Chronic back pain   . Depression   . Dyspnea    with exertion   . GERD (gastroesophageal reflux disease)   . Hypertension   . Seasonal allergies   . Spinal stenosis of lumbar region   . 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 Wheezing     Past Surgical History:  Procedure Laterality Date  . BACK SURGERY     5 total  . BIOPSY  01/03/2017   Procedure: BIOPSY;  Surgeon: Daneil Dolin, MD;  [facility];  Service:  Endoscopy;;  gastric  . COLONOSCOPY WITH PROPOFOL N/A 04/04/2017   Procedure: COLONOSCOPY WITH PROPOFOL;  Surgeon: Daneil Dolin, MD;  [facility];  Service: Endoscopy;  Laterality: N/A;  8:45am  . ESOPHAGOGASTRODUODENOSCOPY (EGD) WITH PROPOFOL N/A 01/03/2017   Procedure: ESOPHAGOGASTRODUODENOSCOPY (EGD) WITH PROPOFOL;  Surgeon: Daneil Dolin, MD;  [facility];  Service: Endoscopy;  Laterality: N/A;  . Venia Minks DILATION N/A 01/03/2017   Procedure: Venia Minks DILATION;  Surgeon: Daneil Dolin, MD;  [facility];  Service: Endoscopy;  Laterality: N/A;  . MULTIPLE EXTRACTIONS WITH ALVEOLOPLASTY N/A 07/08/2014   Procedure: MULTIPLE EXTRACION WITH ALVEOLOPLASTY with EXCISION LESION RIGHT SIDE OF TONGUE;  Surgeon: Gae Bon, DDS;  Location: [facility];  Service: Oral Surgery;  Laterality: N/A;  . PORTACATH PLACEMENT Right 07/30/2016   Procedure: INSERTION PORT-A-CATH;  Surgeon: Vickie Epley, MD;  Location: [facility];  Service: Vascular;  Laterality: Right;  [name] VIDEO BRONCHOSCOPY WITH ENDOBRONCHIAL ULTRASOUND N/A 07/19/2016   Procedure: VIDEO BRONCHOSCOPY WITH ENDOBRONCHIAL ULTRASOUND;  Surgeon: [name], MD;  Location: [facility] OR;  Service: Thoracic;  Laterality: N/A;    Family History  Problem Relation Age of Onset  . Cancer Mother        breast cancer  . Heart failure Brother   . Colon cancer Neg Hx        not sure, ?grandfather and/or uncle    Social History   Social History  . Marital status: Divorced  Spouse name: N/A  . Number of children: N/A  . Years of education: N/A   Social History Main Topics  . Smoking status: Current Every Day Smoker    Packs/day: 0.25    Years: 38.00    Types: Cigarettes  . Smokeless tobacco: Former Systems developer    Types: Chew     Comment: using chantix- smoking once in a while  . Alcohol use 1.2 oz/week    2 Cans of beer per week     Comment: reports drinking 0-2 beers a week  . Drug use: Yes    Frequency: 7.0 times  per week    Types: Marijuana     Comment: daily for cancer; pt denies any use at present time 08/23/17  . Sexual activity: Not Currently   Other Topics Concern  . None   Social History Narrative  . None     PHYSICAL EXAMINATION  ECOG PERFORMANCE STATUS: 1 - Symptomatic but completely ambulatory  Vitals:   09/15/17 1023  BP: 100/72  Pulse: 100  Resp: 18  Temp: 98.7 F (37.1 C)  SpO2: 92%   Constitutional: Well-developed, well-nourished, and in no distress.   HENT:  Head: Normocephalic and atraumatic.  Mouth/Throat: No oropharyngeal exudate. Mucosa moist. Eyes: Pupils are equal, round, and reactive to light. Conjunctivae are normal. No scleral icterus.  Neck: Normal range of motion. Neck supple. No JVD present.  Cardiovascular: Normal rate, regular rhythm and normal heart sounds.  Exam reveals no gallop and no friction rub.   No murmur heard. Pulmonary/Chest: Effort normal. No respiratory distress. Bilateral expiratory wheezing with scattered rhonchi. Abdominal: Soft. Bowel sounds are normal. No distension. There is no tenderness. There is no guarding.  Musculoskeletal: No edema or tenderness.  Lymphadenopathy:    No cervical adenopathy. Left supraclavicular fullness. Neurological: Alert and oriented to person, place, and time. No cranial nerve deficit.  Skin: Skin is warm and dry. No rash noted. No erythema. No pallor.  Psychiatric: Affect and judgment normal.     LABORATORY DATA: CBC    Component Value Date/Time   WBC 6.5 08/25/2017 1044   RBC 4.45 08/25/2017 1044   HGB 14.4 08/25/2017 1044   HCT 42.8 08/25/2017 1044   PLT 173 08/25/2017 1044   MCV 96.2 08/25/2017 1044   MCH 32.4 08/25/2017 1044   MCHC 33.6 08/25/2017 1044   RDW 14.7 08/25/2017 1044   LYMPHSABS 1.5 08/25/2017 1044   MONOABS 0.5 08/25/2017 1044   EOSABS 0.1 08/25/2017 1044   BASOSABS 0.0 08/25/2017 1044      Chemistry      Component Value Date/Time   NA 135 08/25/2017 1044   K 3.8  08/25/2017 1044   CL 99 (L) 08/25/2017 1044   CO2 29 08/25/2017 1044   BUN 11 08/25/2017 1044   CREATININE 0.80 08/25/2017 1044      Component Value Date/Time   CALCIUM 8.7 (L) 08/25/2017 1044   ALKPHOS 149 (H) 08/25/2017 1044   AST 51 (H) 08/25/2017 1044   ALT 40 08/25/2017 1044   BILITOT 0.7 08/25/2017 1044        PENDING LABS:   RADIOGRAPHIC STUDIES:  Dg Chest 2 View  Result Date: 08/22/2017 CLINICAL DATA:  Lt upper chest and shoulder pain. Lung ca. EXAM: CHEST  2 VIEW COMPARISON:  07/15/2017 and older exams. FINDINGS: Opacity the right lung base obscuring hemidiaphragm right heart border, with associated volume loss, is without significant change from prior study. Left lung is hyperexpanded, also stable. Remainder of  the lungs is clear. Right pleural effusion is similar to the prior exam. No left pleural effusion. No pneumothorax. Cardiac silhouette is normal in size. No mediastinal or left hilar masses or adenopathy. Right hilum is less well visualized due to the contiguous lower lung zone opacity. Its appearance is stable from prior study. Right anterior chest wall, internal jugular, far Port-A-Cath is stable, tip projecting in the lower superior vena cava. Skeletal structures are demineralized. No osteoblastic or osteolytic lesions. IMPRESSION: 1. No acute cardiopulmonary disease. 2. Stable treatment related changes at the right lung base with a chronic right pleural effusion. Electronically Signed   By: Lajean Manes M.D.   On: 08/22/2017 13:48   Ct Angio Chest Pe W And/or Wo Contrast  Result Date: 08/22/2017 CLINICAL DATA:  Left chest and shoulder pain, cough, and intermittent fevers for several days. Currently undergoing chemotherapy for lung carcinoma. EXAM: CT ANGIOGRAPHY CHEST WITH CONTRAST TECHNIQUE: Multidetector CT imaging of the chest was performed using the standard protocol during bolus administration of intravenous contrast. Multiplanar CT image reconstructions and  MIPs were obtained to evaluate the vascular anatomy. CONTRAST:  100 mL Isovue 370 COMPARISON:  07/14/2017 FINDINGS: Cardiovascular: Satisfactory opacification of pulmonary arteries noted, and no pulmonary emboli identified. No evidence of thoracic aortic dissection or aneurysm. Mediastinum/Nodes: No significant change in mild left supraclavicular lymphadenopathy, with largest lymph node measuring 12 mm on image 10/5. Mild mediastinal lymphadenopathy in the left prevascular space also without significant change, with largest lymph node measuring 8 mm on image 42/5. No new or increased lymphadenopathy identified. Lungs/Pleura: Moderate right pleural effusion is mildly decreased in size since previous study. Right lung paramediastinal radiation changes are again noted. 6 mm anterior left upper lobe pulmonary nodule on image 3 4/7 remains stable. No new or enlarging pulmonary nodules identified. No evidence of acute infiltrate. Upper abdomen: No acute findings. Musculoskeletal: No suspicious bone lesions identified. Review of the MIP images confirms the above findings. IMPRESSION: No evidence of pulmonary embolism or other acute findings. Stable mild left supraclavicular and superior mediastinal lymphadenopathy. Stable right lung paramediastinal radiation changes. Mild decrease in size of right pleural effusion. Electronically Signed   By: Earle Gell M.D.   On: 08/22/2017 16:35   Dg Shoulder Left  Result Date: 08/22/2017 CLINICAL DATA:  Severe lt shoulder pain and decreased rom x 2-3 days. He said that he reached to unplug a drop cord about 2 weeks ago and started hurting a few days later. EXAM: LEFT SHOULDER - 2+ VIEW COMPARISON:  None. FINDINGS: No fracture or bone lesion. There is narrowing of the inferior aspect of the glenohumeral joint with small marginal osteophytes from the inferior margin of the humeral head. There are mild AC joint osteoarthritic changes. Soft tissues are unremarkable. IMPRESSION: 1. No  fracture, dislocation or acute finding. 2. Glenohumeral joint arthropathic changes. Mild AC joint osteoarthritis. Electronically Signed   By: Lajean Manes M.D.   On: 08/22/2017 13:45   [place] Core Biopsy (lymph Nodes)  Result Date: 08/23/2017 CLINICAL DATA:  History of squamous carcinoma of the right lung. Enlarged left supraclavicular lymph nodes suspicious for metastatic disease. The patient presents for lymph no biopsy. EXAM: ULTRASOUND GUIDED CORE BIOPSY OF LEFT SUPRACLAVICULAR LYMPH NODE MEDICATIONS: 2.0 mg IV Versed; 100 mcg IV Fentanyl Total Moderate Sedation Time: 16 minutes The patient's level of consciousness and physiologic status were continuously monitored during the procedure by Radiology nursing. PROCEDURE: The procedure, risks, benefits, and alternatives were explained to the patient. Questions regarding the  procedure were encouraged and answered. The patient understands and consents to the procedure. A time out was performed prior to initiating the procedure. Ultrasound was performed in the left supraclavicular region. The base of the left neck was prepped with chlorhexidine in a sterile fashion, and a sterile drape was applied covering the operative field. A sterile gown and sterile gloves were used for the procedure. Local anesthesia was provided with 1% Lidocaine. Under ultrasound guidance, a left supraclavicular lymph no was sampled with an 18 gauge core biopsy device. Five separate core biopsy samples were obtained and submitted in saline and formalin. Post biopsy imaging was performed with ultrasound. COMPLICATIONS: None. FINDINGS: The largest left supraclavicular lymph node measures approximately 2.1 cm in long axis. Solid tissue was obtained from the lymph node. IMPRESSION: Ultrasound-guided core biopsy performed of enlarged left supraclavicular lymph node. Electronically Signed   By: Aletta Edouard M.D.   On: 08/23/2017 15:42     PATHOLOGY:    ASSESSMENT AND PLAN:  1. Stage IV  ([id number]) adenocarcinoma of right lung, initially diagnosed with Stage IIIA (T3N2M0) disease on RML biopsy by Dr. Roxan Hockey on 07/21/2016.  He is S/P concomitant chemoXRT with Cisplatin/Etoposide (08/17/2016- 09/20/2016).  He was then transitioned to consolidative immunotherapy with Imfinzi, but then developed progression of disease on imaging in 07/2017.  His treatment was therefore transitioned to Tecentriq beginning on 08/04/2017.  Left supraclavicular biopsy on 08/23/17 demonstrated metastatic adenocarcinoma of lung.   -Not enough tissue on supraclavicular bx to send for Foundation One, therefore we will get Liquid Foundation One from peripheral blood today. -Continue tecentriq cycle 3 as scheduled today. Patient tolerating it well.  -Plan to restage prior to his next cycle with CT C/A/P to assess response.  2. Sinus infection -I have sent in an Rx for augmentin x 7 days for the patient.  RTC in 3 weeks for next treatment, follow up and review of scans.   ORDERS PLACED FOR THIS ENCOUNTER: Orders Placed This Encounter  Procedures  . CT Chest W Contrast  . CT Abdomen Pelvis W Contrast    MEDICATIONS PRESCRIBED THIS ENCOUNTER: Meds ordered this encounter  Medications  . amoxicillin-clavulanate (AUGMENTIN) 875-125 MG tablet    Sig: Take 1 tablet by mouth 2 (two) times daily.    Dispense:  14 tablet    Refill:  0    THERAPY PLAN:  Continue with treatment as planned.    All questions were answered. The patient knows to call the clinic with any problems, questions or concerns. We can certainly see the patient much sooner if necessary.   This note is electronically signed by: Twana First, MD 09/15/2017 10:54 AM

## 2017-09-15 NOTE — Patient Instructions (Signed)
[facility] Discharge Instructions for Patients Receiving Chemotherapy   Beginning January 23rd 2017 lab work for the [facility] will be done in the  Main lab at [facility] on 1st floor. If you have a lab appointment with the [facility] please come in thru the  Main Entrance and check in at the main information desk   Today you received the following chemotherapy agents Tecentriq. Follow-up as scheduled. Call clinic for any questions or concerns  To help prevent nausea and vomiting after your treatment, we encourage you to take your nausea medication   If you develop nausea and vomiting, or diarrhea that is not controlled by your medication, call the clinic.  The clinic phone number is [phone]. Office hours are Monday-Friday 8:30am-5:00pm.  BELOW ARE SYMPTOMS THAT SHOULD BE REPORTED IMMEDIATELY:  *FEVER GREATER THAN 101.0 F  *CHILLS WITH OR WITHOUT FEVER  NAUSEA AND VOMITING THAT IS NOT CONTROLLED WITH YOUR NAUSEA MEDICATION  *UNUSUAL SHORTNESS OF BREATH  *UNUSUAL BRUISING OR BLEEDING  TENDERNESS IN MOUTH AND THROAT WITH OR WITHOUT PRESENCE OF ULCERS  *URINARY PROBLEMS  *BOWEL PROBLEMS  UNUSUAL RASH Items with * indicate a potential emergency and should be followed up as soon as possible. If you have an emergency after office hours please contact your primary care physician or go to the nearest emergency department.  Please call the clinic during office hours if you have any questions or concerns.   You may also contact the Patient Navigator at [phone] should you have any questions or need assistance in obtaining follow up care.      Resources For Cancer Patients and their Caregivers ? American Cancer Society: Can assist with transportation, wigs, general needs, runs Look Good Feel Better.        (612) 709-5743 ? Cancer Care: Provides financial assistance, online support groups, medication/co-pay assistance.  1-800-813-HOPE  (561)404-0556) ? [place] Assists [place] cancer patients and their families through emotional , educational and financial support.  902-879-4675 ? [place] DSS Where to apply for food stamps, Medicaid and utility assistance. (901)767-8670 ? [facility]: Transportation to medical appointments. 914 817 8076 ? Social Security Administration: May apply for disability if have a Stage IV cancer. 978-014-6725 918 760 8571 ? [employer], Disability and Transit Services: Assists with nutrition, care and transit needs. 727-104-7732

## 2017-09-15 NOTE — Progress Notes (Signed)
Erik [name] tolerated Tecentriq infusion well without complaints or incident. Labs reviewed with Dr. Talbert Cage prior to administering this medication. VSS upon discharge. Pt discharged self ambulatory in satisfactory condition

## 2017-09-15 NOTE — Patient Instructions (Signed)
[place] at [facility]  Discharge Instructions:  You saw Dr. Talbert Cage today _______________________________________________________________  Thank you for choosing [place] at [facility] to provide your oncology and hematology care.  To afford each patient quality time with our providers, please arrive at least 15 minutes before your scheduled appointment.  You need to re-schedule your appointment if you arrive 10 or more minutes late.  We strive to give you quality time with our providers, and arriving late affects you and other patients whose appointments are after yours.  Also, if you no show three or more times for appointments you may be dismissed from the clinic.  Again, thank you for choosing [facility] [place] hope is that these requests will allow you access to exceptional care and in a timely manner. _______________________________________________________________  If you have questions after your visit, please contact our office at [phone] between the hours of 8:30 a.m. and 5:00 p.m. Voicemails left after 4:30 p.m. will not be returned until the following business day. _______________________________________________________________  For prescription refill requests, have your pharmacy contact our office. _______________________________________________________________  Recommendations made by the consultant and any test results will be sent to your referring physician. _______________________________________________________________

## 2017-09-20 ENCOUNTER — Other Ambulatory Visit (HOSPITAL_COMMUNITY): Payer: Self-pay | Admitting: Adult Health

## 2017-09-20 DIAGNOSIS — C3491 Malignant neoplasm of unspecified part of right bronchus or lung: Secondary | ICD-10-CM

## 2017-10-04 ENCOUNTER — Ambulatory Visit (HOSPITAL_COMMUNITY)
Admission: RE | Admit: 2017-10-04 | Discharge: 2017-10-04 | Disposition: A | Discharge status: Home / Self Care | Payer: Medicare Other | Source: Ambulatory Visit | Attending: Oncology | Admitting: Oncology

## 2017-10-04 ENCOUNTER — Telehealth (HOSPITAL_COMMUNITY): Payer: Self-pay

## 2017-10-04 DIAGNOSIS — R590 Localized enlarged lymph nodes: Secondary | ICD-10-CM | POA: Insufficient documentation

## 2017-10-04 DIAGNOSIS — R932 Abnormal findings on diagnostic imaging of liver and biliary tract: Secondary | ICD-10-CM | POA: Insufficient documentation

## 2017-10-04 DIAGNOSIS — J439 Emphysema, unspecified: Secondary | ICD-10-CM | POA: Diagnosis not present

## 2017-10-04 DIAGNOSIS — C3491 Malignant neoplasm of unspecified part of right bronchus or lung: Secondary | ICD-10-CM

## 2017-10-04 DIAGNOSIS — Y842 Radiological procedure and radiotherapy as the cause of abnormal reaction of the patient, or of later complication, without mention of misadventure at the time of the procedure: Secondary | ICD-10-CM | POA: Insufficient documentation

## 2017-10-04 DIAGNOSIS — J90 Pleural effusion, not elsewhere classified: Secondary | ICD-10-CM | POA: Diagnosis not present

## 2017-10-04 MED ORDER — IOPAMIDOL (ISOVUE-300) INJECTION 61%
100.0000 mL | Freq: Once | INTRAVENOUS | Status: AC | PRN
  Administered 2017-10-04: 100 mL via INTRAVENOUS

## 2017-10-04 NOTE — Telephone Encounter (Signed)
Patient called requesting records for a "class action lawsuit against [employer]". Referred patient to medical records and gave him the number. He verbalized understanding.

## 2017-10-06 ENCOUNTER — Encounter (HOSPITAL_COMMUNITY): Payer: Medicare Other | Attending: Hematology

## 2017-10-06 ENCOUNTER — Encounter (HOSPITAL_COMMUNITY): Payer: Self-pay

## 2017-10-06 ENCOUNTER — Encounter (HOSPITAL_BASED_OUTPATIENT_CLINIC_OR_DEPARTMENT_OTHER): Payer: Medicare Other | Admitting: Oncology

## 2017-10-06 VITALS — BP 121/72 | HR 73 | Temp 98.0°F | Resp 20 | Wt 232.2 lb

## 2017-10-06 DIAGNOSIS — Z5112 Encounter for antineoplastic immunotherapy: Secondary | ICD-10-CM

## 2017-10-06 DIAGNOSIS — D751 Secondary polycythemia: Secondary | ICD-10-CM | POA: Diagnosis not present

## 2017-10-06 DIAGNOSIS — C3491 Malignant neoplasm of unspecified part of right bronchus or lung: Secondary | ICD-10-CM | POA: Diagnosis present

## 2017-10-06 DIAGNOSIS — R911 Solitary pulmonary nodule: Secondary | ICD-10-CM

## 2017-10-06 DIAGNOSIS — F17218 Nicotine dependence, cigarettes, with other nicotine-induced disorders: Secondary | ICD-10-CM | POA: Diagnosis not present

## 2017-10-06 DIAGNOSIS — C771 Secondary and unspecified malignant neoplasm of intrathoracic lymph nodes: Secondary | ICD-10-CM

## 2017-10-06 DIAGNOSIS — J90 Pleural effusion, not elsewhere classified: Secondary | ICD-10-CM | POA: Diagnosis not present

## 2017-10-06 LAB — COMPREHENSIVE METABOLIC PANEL
ALBUMIN: 3.1 g/dL — AB (ref 3.5–5.0)
ALT: 25 U/L (ref 17–63)
AST: 30 U/L (ref 15–41)
Alkaline Phosphatase: 155 U/L — ABNORMAL HIGH (ref 38–126)
Anion gap: 6 (ref 5–15)
BILIRUBIN TOTAL: 0.5 mg/dL (ref 0.3–1.2)
BUN: 7 mg/dL (ref 6–20)
CO2: 29 mmol/L (ref 22–32)
CREATININE: 0.77 mg/dL (ref 0.61–1.24)
Calcium: 8.5 mg/dL — ABNORMAL LOW (ref 8.9–10.3)
Chloride: 102 mmol/L (ref 101–111)
GFR calc Af Amer: >60 mL/min (ref >60)
GLUCOSE: 143 mg/dL — AB (ref 65–99)
POTASSIUM: 3.8 mmol/L (ref 3.5–5.1)
Sodium: 137 mmol/L (ref 135–145)
TOTAL PROTEIN: 7.3 g/dL (ref 6.5–8.1)

## 2017-10-06 LAB — CBC WITH DIFFERENTIAL/PLATELET
BASOS ABS: 0 10*3/uL (ref 0.0–0.1)
BASOS PCT: 0 %
Eosinophils Absolute: 0.1 10*3/uL (ref 0.0–0.7)
Eosinophils Relative: 1 %
HEMATOCRIT: 41.4 % (ref 39.0–52.0)
HEMOGLOBIN: 13.6 g/dL (ref 13.0–17.0)
LYMPHS PCT: 24 %
Lymphs Abs: 1.2 10*3/uL (ref 0.7–4.0)
MCH: 32 pg (ref 26.0–34.0)
MCHC: 32.9 g/dL (ref 30.0–36.0)
MCV: 97.4 fL (ref 78.0–100.0)
MONO ABS: 0.4 10*3/uL (ref 0.1–1.0)
Monocytes Relative: 8 %
NEUTROS ABS: 3.3 10*3/uL (ref 1.7–7.7)
NEUTROS PCT: 67 %
Platelets: 137 10*3/uL — ABNORMAL LOW (ref 150–400)
RBC: 4.25 MIL/uL (ref 4.22–5.81)
RDW: 15.7 % — AB (ref 11.5–15.5)
WBC: 5 10*3/uL (ref 4.0–10.5)

## 2017-10-06 LAB — TSH: TSH: 2.521 u[IU]/mL (ref 0.350–4.500)

## 2017-10-06 MED ORDER — VARENICLINE TARTRATE 0.5 MG PO TABS: ORAL_TABLET | ORAL | 0 refills | Status: DC

## 2017-10-06 MED ORDER — SODIUM CHLORIDE 0.9% FLUSH
10.0000 mL | INTRAVENOUS | Status: DC | PRN
  Administered 2017-10-06: 10 mL
  Filled 2017-10-06: qty 10

## 2017-10-06 MED ORDER — SODIUM CHLORIDE 0.9 % IV SOLN
1200.0000 mg | Freq: Once | INTRAVENOUS | Status: AC
  Administered 2017-10-06: 1200 mg via INTRAVENOUS
  Filled 2017-10-06: qty 20

## 2017-10-06 MED ORDER — HEPARIN SOD (PORK) LOCK FLUSH 100 UNIT/ML IV SOLN
500.0000 [IU] | Freq: Once | INTRAVENOUS | Status: AC | PRN
  Administered 2017-10-06: 500 [IU]

## 2017-10-06 MED ORDER — SODIUM CHLORIDE 0.9 % IV SOLN
Freq: Once | INTRAVENOUS | Status: AC
  Administered 2017-10-06: 12:00:00 via INTRAVENOUS

## 2017-10-06 NOTE — Progress Notes (Signed)
[place]y reviewed with Dr. Talbert Cage and pt seen by Dr. Talbert Cage today. Tecentriq infusion approved for pt today per MD                                                                                       Erik [name] tolerated Tecentriq infusion well without complaints or incident. VSS upon discharge. Pt discharged self ambulatory in satisfactory condition

## 2017-10-06 NOTE — Progress Notes (Signed)
Erik [name], [place] [address] [id number]  No diagnosis found.  CURRENT THERAPY: Tecentriq beginning on 08/04/2017    Adenocarcinoma of right lung ([place])   07/01/2016 PET scan    6.3 x 3.7 cm right lower lobe mass, suspicious for primary bronchogenic neoplasm, as described above. Hypermetabolic thoracic nodal metastases, as above. Additional right perihilar hypermetabolism, indeterminate. Associated right middle lobe atelectasis/collapse.      07/19/2016 Procedure    Bronchoscopy with brushings and biopsies and endobronchial ultrasound with mediastinal lymph node aspirations by Dr. Roxan Hockey      07/21/2016 Pathology Results    Lung, biopsy, Right Middle Lobe - LUNG TISSUE WITH SQUAMOUS METAPLASIA. - NO MALIGNANCY IDENTIFIED.      07/21/2016 Pathology Results    FINE NEEDLE ASPIRATION, ENDOSCOPIC (A) LEVEL 7 (SPECIMEN 1 OF 3, COLLECTED ON 07/19/16): MALIGNANT CELLS CONSISTENT WITH ADENOCARCINOMA.      07/21/2016 Pathology Results    FINE NEEDLE ASPIRATION, EBUS, 4R, B (SPECIMEN 2 OF 3, COLLECTED 07/19/16): MALIGNANT CELLS CONSISTENT WITH ADENOCARCINOMA.      07/21/2016 Pathology Results    FINE NEEDLE ASPIRATION, EBUS, BRUSHING, RIGHT MIDDLE LOBE, D (SPECIMEN 3 OF 3, COLLECTED 07/19/16): MALIGNANT CELLS CONSISTENT WITH ADENOCARCINOMA.      07/22/2016 Imaging    MRI brain- No evidence of intracranial metastases.      08/17/2016 - 09/20/2016 Chemotherapy    The patient had palonosetron (ALOXI) injection 0.25 mg, 0.25 mg, Intravenous,  Once, 1 of 1 cycle  CISplatin (PLATINOL) 123 mg in sodium chloride 0.9 % 500 mL chemo infusion, 50 mg/m2 = 123 mg, Intravenous,  Once, 1 of 1 cycle  etoposide (VEPESID) 120 mg in sodium chloride 0.9 % 500 mL chemo infusion, 50 mg/m2 = 120 mg, Intravenous,  Once, 1 of 1 cycle  fosaprepitant (EMEND) 150 mg, dexamethasone (DECADRON) 12 mg in sodium chloride 0.9 % 145 mL IVPB, , Intravenous,  Once, 1 of 1  cycle  ondansetron (ZOFRAN) 8 mg in sodium chloride 0.9 % 50 mL IVPB, , Intravenous,  Once, 2 of 6 cycles  for chemotherapy treatment.        08/17/2016 -  Radiation Therapy         11/15/2016 Imaging    CT CAP- Interval response to therapy. The previously demonstrated mediastinal and right hilar adenopathy and resulting right middle lobe atelectasis have all improved. 2. No discrete residual lung masses are identified. There is new multifocal ground-glass opacity within the right lower lobe, and to a lesser extent in the left upper lobe. These are probably inflammatory/treatment related. 3. No evidence of abdominopelvic metastatic disease. Stable prominent lymph nodes in the upper abdomen, likely reactive. 4. Decompressed mid SVC without specific signs of SVC occlusion.      12/02/2016 -  Chemotherapy    Imfinzi (durvalumab) immunotherapy every 2 weeks x up to 1 year      01/03/2017 Procedure    EGD by Dr. [name], colonoscopy aborted as "patient forgot to take other half of preparation). Esophagitis. likely radiation-induced ?Dilated.  Erythematous mucosa in the stomach. Biopsied. Normal duodenal bulb and second portion of the duodenum.      02/14/2017 Imaging    CT chest- 1. Development of a small to moderate right-sided pleural effusion with minimal loculation anteriorly. 2. Worsened right-sided aeration with right middle and lower lobe consolidation. As this has a geographic distribution, this could be radiation induced or represent infection. Depending on clinical concern of progressive  disease, thoracentesis and/or PET may be informative. 3. Development of mild right paratracheal adenopathy, most likely reactive. Recommend attention on follow-up. 4. Subtle findings which are highly suspicious for mild cirrhosis. Upper abdominal adenopathy is similar and likely reactive. 5. Persistent right lower and improved left upper lobe ground-glass opacities are favored to be  infectious or inflammatory.      06/01/2017 Imaging    CT chest  IMPRESSION: 1. Evolving radiation changes in the medial right hemithorax. 2. Stable moderate size right pleural effusion without definite nodular components. 3. No signs of local recurrence or progressive metastatic disease. No residual enlarged thoracic lymph nodes. 4. Stable appearance of the visualized upper abdomen with probable cirrhosis and reactive adenopathy in the porta hepatis.      07/15/2017 Procedure    Therapeutic thoracentesis performed today; 1.3 L removed.  Fluid sent for cytology.       07/15/2017 Pathology Results    Pleural fluid NEGATIVE for malignancy by cytology.       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 Paramediastinal radiation change and loculated right pleural effusion appear unchanged from 07/14/2017. 2. Borderline prevascular lymph node within the anterior mediastinum is mildly increased in size from previous exam. 3. There is a new 5 mm lung nodule within the left lower lobe. Nonspecific in appearance. Attention on follow-up imaging advise. Other small nonspecific nodules in the left lung are stable. 4. Morphologic features of the liver compatible with cirrhosis. Enlarged upper abdominal lymph nodes are nonspecific and likely reactive. 5. Stable indeterminate low-attenuation focus in the posterior right liver dome. Previously characterized on MRI from 07/29/2017 as reflecting post treatment changes from external beam radiation to the lung. 6.  Emphysema (ICD10-J43.9).       INTERVAL HISTORY: Erik [name] 56 y.o. male returns for followup of progressive non-small cell lung cancer, adenocarcinoma type. Patient presents today for cycle 4 of tecentriq and follow up. Patient states that he has been feeling some increased shortness of breath. He continues to smoke 1/2 ppd but states he  really wants to quit and get back on chantix which he took before. He stated he tolerated chantix well last time except for more dreams at night. He denies any chest pain, diarrhea, nausea, vomiting, headaches, blurry vision, weakness.  Review of Systems  Constitutional: Negative.  Negative for chills, fever and weight loss.  HENT: Negative for congestion.   Eyes: Negative.   Respiratory: Positive for shortness of breath. Negative for cough.   Cardiovascular: Negative.  Negative for chest pain.  Gastrointestinal: Negative.  Negative for blood in stool, constipation, diarrhea, melena, nausea and vomiting.  Genitourinary: Negative.   Musculoskeletal: Negative.   Skin: Negative.   Neurological: Negative.  Negative for weakness.  Endo/Heme/Allergies: Negative.   Psychiatric/Behavioral: Negative.     Past Medical History:  Diagnosis Date  . 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 Arthritis   . Asthma   . Chronic back pain   . Depression   . Dyspnea    with exertion   . GERD (gastroesophageal reflux disease)   . Hypertension   . Seasonal allergies   . Spinal stenosis of lumbar region   . Squamous cell carcinoma of right lung ([facility] [facility]) 07/23/2016  . Wheezing     Past Surgical History:  Procedure Laterality Date  . BACK SURGERY     5 total  . BIOPSY  01/03/2017   Procedure: BIOPSY;  Surgeon: Daneil Dolin, MD;  Location: [facility];  Service: Endoscopy;;  gastric  . COLONOSCOPY WITH PROPOFOL N/A 04/04/2017   Procedure: COLONOSCOPY WITH PROPOFOL;  Surgeon: Daneil Dolin, MD;  [facility];  Service: Endoscopy;  Laterality: N/A;  8:45am  . ESOPHAGOGASTRODUODENOSCOPY (EGD) WITH PROPOFOL N/A 01/03/2017   Procedure: ESOPHAGOGASTRODUODENOSCOPY (EGD) WITH PROPOFOL;  Surgeon: Daneil Dolin, MD;  [facility];  Service: Endoscopy;  Laterality: N/A;  . Venia Minks DILATION N/A 01/03/2017   Procedure: Venia Minks DILATION;  Surgeon: Daneil Dolin, MD;  [facility];   Service: Endoscopy;  Laterality: N/A;  . MULTIPLE EXTRACTIONS WITH ALVEOLOPLASTY N/A 07/08/2014   Procedure: MULTIPLE EXTRACION WITH ALVEOLOPLASTY with EXCISION LESION RIGHT SIDE OF TONGUE;  Surgeon: Gae Bon, DDS;  Location: [facility];  Service: Oral Surgery;  Laterality: N/A;  . PORTACATH PLACEMENT Right 07/30/2016   Procedure: INSERTION PORT-A-CATH;  Surgeon: Vickie Epley, MD;  Location: [facility];  Service: Vascular;  Laterality: Right;  [name] VIDEO BRONCHOSCOPY WITH ENDOBRONCHIAL ULTRASOUND N/A 07/19/2016   Procedure: VIDEO BRONCHOSCOPY WITH ENDOBRONCHIAL ULTRASOUND;  Surgeon: [name], MD;  Location[facility];  Service: Thoracic;  Laterality: N/A;    Family History  Problem Relation Age of Onset  . Cancer Mother        breast cancer  . Heart failure Brother   . Colon cancer Neg Hx        not sure, ?grandfather and/or uncle    Social History   Social History  . Marital status: Divorced    Spouse name: N/A  . Number of children: N/A  . Years of education: N/A   Social History Main Topics  . Smoking status: Current Every Day Smoker    Packs/day: 0.25    Years: 38.00    Types: Cigarettes  . Smokeless tobacco: Former Systems developer    Types: Chew     Comment: using chantix- smoking once in a while  . Alcohol use 1.2 oz/week    2 Cans of beer per week     Comment: reports drinking 0-2 beers a week  . Drug use: Yes    Frequency: 7.0 times per week    Types: Marijuana     Comment: daily for cancer; pt denies any use at present time 08/23/17  . Sexual activity: Not Currently   Other Topics Concern  . Not on file   Social History Narrative  . No narrative on file     PHYSICAL EXAMINATION  ECOG PERFORMANCE STATUS: 1 - Symptomatic but completely ambulatory  There were no vitals filed for this visit. Constitutional: Well-developed, well-nourished, and in no distress.   HENT:  Head: Normocephalic and atraumatic.  Mouth/Throat: No oropharyngeal exudate. Mucosa moist. Eyes:  Pupils are equal, round, and reactive to light. Conjunctivae are normal. No scleral icterus.  Neck: Normal range of motion. Neck supple. No JVD present.  Cardiovascular: Normal rate, regular rhythm and normal heart sounds.  Exam reveals no gallop and no friction rub.   No murmur heard. Pulmonary/Chest: Effort normal. No respiratory distress. Bilateral rhonchi. Abdominal: Soft. Bowel sounds are normal. No distension. There is no tenderness. There is no guarding.  Musculoskeletal: No edema or tenderness.  Lymphadenopathy:    No cervical adenopathy. No supraclavicular lymphadenopathy. Neurological: Alert and oriented to person, place, and time. No cranial nerve deficit.  Skin: Skin is warm and dry. No rash noted. No erythema. No pallor.  Psychiatric:  Affect and judgment normal.     LABORATORY DATA: CBC    Component Value Date/Time   WBC 5.0 10/06/2017 1051   RBC 4.25 10/06/2017 1051   HGB 13.6 10/06/2017 1051   HCT 41.4 10/06/2017 1051   PLT 137 (L) 10/06/2017 1051   MCV 97.4 10/06/2017 1051   MCH 32.0 10/06/2017 1051   MCHC 32.9 10/06/2017 1051   RDW 15.7 (H) 10/06/2017 1051   LYMPHSABS 1.2 10/06/2017 1051   MONOABS 0.4 10/06/2017 1051   EOSABS 0.1 10/06/2017 1051   BASOSABS 0.0 10/06/2017 1051      Chemistry      Component Value Date/Time   NA 134 (L) 09/15/2017 1041   K 3.9 09/15/2017 1041   CL 96 (L) 09/15/2017 1041   CO2 31 09/15/2017 1041   BUN 9 09/15/2017 1041   CREATININE 0.73 09/15/2017 1041      Component Value Date/Time   CALCIUM 8.4 (L) 09/15/2017 1041   ALKPHOS 163 (H) 09/15/2017 1041   AST 39 09/15/2017 1041   ALT 26 09/15/2017 1041   BILITOT 0.8 09/15/2017 1041        PENDING LABS:   RADIOGRAPHIC STUDIES:  Ct Chest W Contrast  Result Date: 10/04/2017 CLINICAL DATA:  Followup lung cancer. Squamous cell carcinoma of the right lung. EXAM: CT CHEST, ABDOMEN, AND PELVIS WITH CONTRAST TECHNIQUE: Multidetector CT imaging of the chest, abdomen and  pelvis was performed following the standard protocol during bolus administration of intravenous contrast. CONTRAST:  130m ISOVUE-300 IOPAMIDOL (ISOVUE-300) INJECTION 61% COMPARISON:  07/14/2017 and 8/ 14/18 FINDINGS: CT CHEST FINDINGS Cardiovascular: Normal heart size. No pericardial effusion. Aortic atherosclerosis. No pericardial effusion. Mediastinum/Nodes: The trachea appears patent and is midline. Normal appearance of the esophagus. Index pre-vascular node measures 1 cm, image 20 of series 2. On 07/14/2017 this measured 8 mm. No axillary or supraclavicular adenopathy. Lungs/Pleura: Mild changes of centrilobular emphysema. Moderate volume loculated right pleural effusion is similar to previous exam. Paramediastinal radiation change is identified within the right lung as before. 5 mm nodule within the left upper lobe is unchanged from previous exam, image 40 of series 4. 4 mm perifissural nodule in the left midlung is unchanged from previous exam, image number 81 of series 4. 5 mm nodule in the left lower lobe is new from the previous study, indeterminate. Stable subpleural nodule within the right upper lung zone measuring 5 mm, image 52 of series 4. Musculoskeletal: No aggressive lytic or sclerotic bone lesions. CT ABDOMEN PELVIS FINDINGS Hepatobiliary: No focal liver abnormality. There is hypertrophy of the caudate lobe and lateral segment of left lobe of liver. The contour the liver is mildly irregular. Ill defined focus of low attenuation in the posterior right liver dome measures 2.1 x 1.9 cm, image 49 of series 2. Unchanged from previous exam. The gallbladder appears normal. No biliary dilatation. Pancreas: Normal appearance of the pancreas. Spleen: Normal in size without focal abnormality. Adrenals/Urinary Tract: The adrenal glands are normal. The kidneys are both unremarkable. Urinary bladder appears normal. The bladder appears normal. Stomach/Bowel: The stomach is normal. No dilated loops of small or  large bowel. The appendix is visualized and is unremarkable. Vascular/Lymphatic: Normal appearance of the abdominal aorta. Prominent upper abdominal lymph nodes are noted. Index porta hepatis node measures 1.3 cm, image 61 of series 2. Previously this measured the same. Portacaval node is also unchanged measuring 1.2 cm, image 61 of series 2. Reproductive:  Prostate gland is unremarkable. Other: No abdominal wall hernia or abnormality.  No abdominopelvic ascites. Musculoskeletal: No aggressive lytic or sclerotic bone lesions. Status post posterior hardware fixation of L3 through S1. IMPRESSION: 1. Paramediastinal radiation change and loculated right pleural effusion appear unchanged from 07/14/2017. 2. Borderline prevascular lymph node within the anterior mediastinum is mildly increased in size from previous exam. 3. There is a new 5 mm lung nodule within the left lower lobe. Nonspecific in appearance. Attention on follow-up imaging advise. Other small nonspecific nodules in the left lung are stable. 4. Morphologic features of the liver compatible with cirrhosis. Enlarged upper abdominal lymph nodes are nonspecific and likely reactive. 5. Stable indeterminate low-attenuation focus in the posterior right liver dome. Previously characterized on MRI from 07/29/2017 as reflecting post treatment changes from external beam radiation to the lung. 6.  Emphysema (ICD10-J43.9). Electronically Signed   By: Kerby Moors M.D.   On: 10/04/2017 10:55   Ct Abdomen Pelvis W Contrast  Result Date: 10/04/2017 CLINICAL DATA:  Followup lung cancer. Squamous cell carcinoma of the right lung. EXAM: CT CHEST, ABDOMEN, AND PELVIS WITH CONTRAST TECHNIQUE: Multidetector CT imaging of the chest, abdomen and pelvis was performed following the standard protocol during bolus administration of intravenous contrast. CONTRAST:  127m ISOVUE-300 IOPAMIDOL (ISOVUE-300) INJECTION 61% COMPARISON:  07/14/2017 and 8/ 14/18 FINDINGS: CT CHEST  FINDINGS Cardiovascular: Normal heart size. No pericardial effusion. Aortic atherosclerosis. No pericardial effusion. Mediastinum/Nodes: The trachea appears patent and is midline. Normal appearance of the esophagus. Index pre-vascular node measures 1 cm, image 20 of series 2. On 07/14/2017 this measured 8 mm. No axillary or supraclavicular adenopathy. Lungs/Pleura: Mild changes of centrilobular emphysema. Moderate volume loculated right pleural effusion is similar to previous exam. Paramediastinal radiation change is identified within the right lung as before. 5 mm nodule within the left upper lobe is unchanged from previous exam, image 40 of series 4. 4 mm perifissural nodule in the left midlung is unchanged from previous exam, image number 81 of series 4. 5 mm nodule in the left lower lobe is new from the previous study, indeterminate. Stable subpleural nodule within the right upper lung zone measuring 5 mm, image 52 of series 4. Musculoskeletal: No aggressive lytic or sclerotic bone lesions. CT ABDOMEN PELVIS FINDINGS Hepatobiliary: No focal liver abnormality. There is hypertrophy of the caudate lobe and lateral segment of left lobe of liver. The contour the liver is mildly irregular. Ill defined focus of low attenuation in the posterior right liver dome measures 2.1 x 1.9 cm, image 49 of series 2. Unchanged from previous exam. The gallbladder appears normal. No biliary dilatation. Pancreas: Normal appearance of the pancreas. Spleen: Normal in size without focal abnormality. Adrenals/Urinary Tract: The adrenal glands are normal. The kidneys are both unremarkable. Urinary bladder appears normal. The bladder appears normal. Stomach/Bowel: The stomach is normal. No dilated loops of small or large bowel. The appendix is visualized and is unremarkable. Vascular/Lymphatic: Normal appearance of the abdominal aorta. Prominent upper abdominal lymph nodes are noted. Index porta hepatis node measures 1.3 cm, image 61 of  series 2. Previously this measured the same. Portacaval node is also unchanged measuring 1.2 cm, image 61 of series 2. Reproductive:  Prostate gland is unremarkable. Other: No abdominal wall hernia or abnormality. No abdominopelvic ascites. Musculoskeletal: No aggressive lytic or sclerotic bone lesions. Status post posterior hardware fixation of L3 through S1. IMPRESSION: 1. Paramediastinal radiation change and loculated right pleural effusion appear unchanged from 07/14/2017. 2. Borderline prevascular lymph node within the anterior mediastinum is mildly increased in size from previous exam.  3. There is a new 5 mm lung nodule within the left lower lobe. Nonspecific in appearance. Attention on follow-up imaging advise. Other small nonspecific nodules in the left lung are stable. 4. Morphologic features of the liver compatible with cirrhosis. Enlarged upper abdominal lymph nodes are nonspecific and likely reactive. 5. Stable indeterminate low-attenuation focus in the posterior right liver dome. Previously characterized on MRI from 07/29/2017 as reflecting post treatment changes from external beam radiation to the lung. 6.  Emphysema (ICD10-J43.9). Electronically Signed   By: Kerby Moors M.D.   On: 10/04/2017 10:55     PATHOLOGY:    ASSESSMENT AND PLAN:  1. Stage IV ([id number]) adenocarcinoma of right lung, initially diagnosed with Stage IIIA (T3N2M0) disease on RML biopsy by Dr. Roxan Hockey on 07/21/2016.  He is S/P concomitant chemoXRT with Cisplatin/Etoposide (08/17/2016- 09/20/2016).  He was then transitioned to consolidative immunotherapy with Imfinzi, but then developed progression of disease on imaging in 07/2017.  His treatment was therefore transitioned to Tecentriq beginning on 08/04/2017.  Left supraclavicular biopsy on 08/23/17 demonstrated metastatic adenocarcinoma of lung. Not enough tissue on supraclavicular bx to send for Foundation One, therefore we ordered Liquid Foundation One from peripheral  blood which genomid findins of MAP2K1 and TP53 mutation, MSI status undetermined.  -Continue tecentriq cycle 4 as scheduled today. Patient tolerating it well.  -I have reviewed his restaging CT scans in detail with him. Previous left supraclavicular and subpectoral lymph nodes have resolved, the loculated pleural effusion is moderate in size but stable. New left lower lobe lung 5 mm lung nodule which we will need to pay attention to in the future. Prevascular lymph node has slightly increased in size from 8 mm to 10 mm. No new metastatic disease. -Reviewed Foundation One Liquid results with him. -Plan to restage in 3 months in early February 2019.  2. Smoke cessation -I will prescribe chantix for the patient, which he states had worked previously.   RTC in 3 weeks for next treatment, follow up and review of scans.     THERAPY PLAN:  Continue with treatment as planned.    All questions were answered. The patient knows to call the clinic with any problems, questions or concerns. We can certainly see the patient much sooner if necessary.   This note is electronically signed by: Twana First, MD 10/06/2017 11:20 AM

## 2017-10-06 NOTE — Patient Instructions (Signed)
[facility] Discharge Instructions for Patients Receiving Chemotherapy   Beginning January 23rd 2017 lab work for the [facility] will be done in the  Main lab at [facility] on 1st floor. If you have a lab appointment with the [facility] please come in thru the  Main Entrance and check in at the main information desk   Today you received the following chemotherapy agents Tecentriq. Follow-up as scheduled. Call clinic for any questions or concerns  To help prevent nausea and vomiting after your treatment, we encourage you to take your nausea medication   If you develop nausea and vomiting, or diarrhea that is not controlled by your medication, call the clinic.  The clinic phone number is [phone]. Office hours are Monday-Friday 8:30am-5:00pm.  BELOW ARE SYMPTOMS THAT SHOULD BE REPORTED IMMEDIATELY:  *FEVER GREATER THAN 101.0 F  *CHILLS WITH OR WITHOUT FEVER  NAUSEA AND VOMITING THAT IS NOT CONTROLLED WITH YOUR NAUSEA MEDICATION  *UNUSUAL SHORTNESS OF BREATH  *UNUSUAL BRUISING OR BLEEDING  TENDERNESS IN MOUTH AND THROAT WITH OR WITHOUT PRESENCE OF ULCERS  *URINARY PROBLEMS  *BOWEL PROBLEMS  UNUSUAL RASH Items with * indicate a potential emergency and should be followed up as soon as possible. If you have an emergency after office hours please contact your primary care physician or go to the nearest emergency department.  Please call the clinic during office hours if you have any questions or concerns.   You may also contact the Patient Navigator at [phone] should you have any questions or need assistance in obtaining follow up care.      Resources For Cancer Patients and their Caregivers ? American Cancer Society: Can assist with transportation, wigs, general needs, runs Look Good Feel Better.        (612) 709-5743 ? Cancer Care: Provides financial assistance, online support groups, medication/co-pay assistance.  1-800-813-HOPE  (561)404-0556) ? [place] Assists [place] cancer patients and their families through emotional , educational and financial support.  902-879-4675 ? [place] DSS Where to apply for food stamps, Medicaid and utility assistance. (901)767-8670 ? [facility]: Transportation to medical appointments. 914 817 8076 ? Social Security Administration: May apply for disability if have a Stage IV cancer. 978-014-6725 918 760 8571 ? [employer], Disability and Transit Services: Assists with nutrition, care and transit needs. 727-104-7732

## 2017-10-13 NOTE — Addendum Note (Signed)
Addended by: Gerhard Perches on: 10/13/2017 02:08 PM   Modules accepted: Orders

## 2017-10-20 ENCOUNTER — Other Ambulatory Visit (HOSPITAL_COMMUNITY): Payer: Self-pay | Admitting: Adult Health

## 2017-10-20 DIAGNOSIS — K219 Gastro-esophageal reflux disease without esophagitis: Secondary | ICD-10-CM

## 2017-10-20 DIAGNOSIS — C3491 Malignant neoplasm of unspecified part of right bronchus or lung: Secondary | ICD-10-CM

## 2017-10-31 ENCOUNTER — Encounter (HOSPITAL_COMMUNITY): Payer: Self-pay | Admitting: Oncology

## 2017-10-31 ENCOUNTER — Encounter (HOSPITAL_BASED_OUTPATIENT_CLINIC_OR_DEPARTMENT_OTHER): Payer: Medicare Other

## 2017-10-31 ENCOUNTER — Other Ambulatory Visit: Payer: Self-pay

## 2017-10-31 ENCOUNTER — Encounter (HOSPITAL_BASED_OUTPATIENT_CLINIC_OR_DEPARTMENT_OTHER): Payer: Medicare Other | Admitting: Oncology

## 2017-10-31 VITALS — BP 96/50 | HR 92 | Temp 97.5°F | Resp 18

## 2017-10-31 VITALS — BP 105/61 | HR 101 | Temp 98.4°F | Resp 18 | Ht 72.0 in | Wt 223.3 lb

## 2017-10-31 DIAGNOSIS — J918 Pleural effusion in other conditions classified elsewhere: Secondary | ICD-10-CM

## 2017-10-31 DIAGNOSIS — R0602 Shortness of breath: Secondary | ICD-10-CM | POA: Diagnosis not present

## 2017-10-31 DIAGNOSIS — C3491 Malignant neoplasm of unspecified part of right bronchus or lung: Secondary | ICD-10-CM

## 2017-10-31 DIAGNOSIS — Z5112 Encounter for antineoplastic immunotherapy: Secondary | ICD-10-CM | POA: Diagnosis present

## 2017-10-31 DIAGNOSIS — Z716 Tobacco abuse counseling: Secondary | ICD-10-CM | POA: Diagnosis not present

## 2017-10-31 DIAGNOSIS — R911 Solitary pulmonary nodule: Secondary | ICD-10-CM | POA: Diagnosis not present

## 2017-10-31 DIAGNOSIS — C771 Secondary and unspecified malignant neoplasm of intrathoracic lymph nodes: Secondary | ICD-10-CM

## 2017-10-31 DIAGNOSIS — K5900 Constipation, unspecified: Secondary | ICD-10-CM

## 2017-10-31 DIAGNOSIS — D751 Secondary polycythemia: Secondary | ICD-10-CM | POA: Diagnosis not present

## 2017-10-31 LAB — CBC WITH DIFFERENTIAL/PLATELET
BASOS ABS: 0 10*3/uL (ref 0.0–0.1)
BASOS PCT: 0 %
EOS ABS: 0.1 10*3/uL (ref 0.0–0.7)
EOS PCT: 1 %
HCT: 44.5 % (ref 39.0–52.0)
HEMOGLOBIN: 14.4 g/dL (ref 13.0–17.0)
LYMPHS PCT: 27 %
Lymphs Abs: 1.8 10*3/uL (ref 0.7–4.0)
MCH: 32 pg (ref 26.0–34.0)
MCHC: 32.4 g/dL (ref 30.0–36.0)
MCV: 98.9 fL (ref 78.0–100.0)
Monocytes Absolute: 0.6 10*3/uL (ref 0.1–1.0)
Monocytes Relative: 8 %
Neutro Abs: 4.2 10*3/uL (ref 1.7–7.7)
Neutrophils Relative %: 64 %
Platelets: 147 10*3/uL — ABNORMAL LOW (ref 150–400)
RBC: 4.5 MIL/uL (ref 4.22–5.81)
RDW: 15.1 % (ref 11.5–15.5)
WBC: 6.7 10*3/uL (ref 4.0–10.5)

## 2017-10-31 LAB — COMPREHENSIVE METABOLIC PANEL
ALK PHOS: 159 U/L — AB (ref 38–126)
ALT: 28 U/L (ref 17–63)
AST: 34 U/L (ref 15–41)
Albumin: 3.4 g/dL — ABNORMAL LOW (ref 3.5–5.0)
Anion gap: 7 (ref 5–15)
BUN: 9 mg/dL (ref 6–20)
CALCIUM: 8.7 mg/dL — AB (ref 8.9–10.3)
CO2: 30 mmol/L (ref 22–32)
CREATININE: 0.88 mg/dL (ref 0.61–1.24)
Chloride: 100 mmol/L — ABNORMAL LOW (ref 101–111)
Glucose, Bld: 101 mg/dL — ABNORMAL HIGH (ref 65–99)
Potassium: 4.1 mmol/L (ref 3.5–5.1)
Sodium: 137 mmol/L (ref 135–145)
Total Bilirubin: 0.5 mg/dL (ref 0.3–1.2)
Total Protein: 7.4 g/dL (ref 6.5–8.1)

## 2017-10-31 LAB — TSH: TSH: 1.147 u[IU]/mL (ref 0.350–4.500)

## 2017-10-31 MED ORDER — SENNOSIDES-DOCUSATE SODIUM 8.6-50 MG PO TABS: 1.0000 | ORAL_TABLET | Freq: Two times a day (BID) | ORAL | 1 refills | Status: AC

## 2017-10-31 MED ORDER — ATEZOLIZUMAB CHEMO INJECTION 1200 MG/20ML
1200.0000 mg | Freq: Once | INTRAVENOUS | Status: AC
  Administered 2017-10-31: 1200 mg via INTRAVENOUS
  Filled 2017-10-31: qty 20

## 2017-10-31 MED ORDER — SODIUM CHLORIDE 0.9% FLUSH
10.0000 mL | INTRAVENOUS | Status: DC | PRN
  Administered 2017-10-31: 10 mL
  Filled 2017-10-31: qty 10

## 2017-10-31 MED ORDER — SODIUM CHLORIDE 0.9 % IV SOLN
Freq: Once | INTRAVENOUS | Status: AC
  Administered 2017-10-31: 12:00:00 via INTRAVENOUS

## 2017-10-31 MED ORDER — HEPARIN SOD (PORK) LOCK FLUSH 100 UNIT/ML IV SOLN
500.0000 [IU] | Freq: Once | INTRAVENOUS | Status: AC | PRN
  Administered 2017-10-31: 500 [IU]
  Filled 2017-10-31: qty 5

## 2017-10-31 NOTE — Progress Notes (Signed)
Erik [name], [place] [address] [place] [address]  No diagnosis found.  CURRENT THERAPY: Tecentriq beginning on 08/04/2017    Adenocarcinoma of right lung ([place])   07/01/2016 PET scan    6.3 x 3.7 cm right lower lobe mass, suspicious for primary bronchogenic neoplasm, as described above. Hypermetabolic thoracic nodal metastases, as above. Additional right perihilar hypermetabolism, indeterminate. Associated right middle lobe atelectasis/collapse.      07/19/2016 Procedure    Bronchoscopy with brushings and biopsies and endobronchial ultrasound with mediastinal lymph node aspirations by Dr. Roxan Hockey      07/21/2016 Pathology Results    Lung, biopsy, Right Middle Lobe - LUNG TISSUE WITH SQUAMOUS METAPLASIA. - NO MALIGNANCY IDENTIFIED.      07/21/2016 Pathology Results    FINE NEEDLE ASPIRATION, ENDOSCOPIC (A) LEVEL 7 (SPECIMEN 1 OF 3, COLLECTED ON 07/19/16): MALIGNANT CELLS CONSISTENT WITH ADENOCARCINOMA.      07/21/2016 Pathology Results    FINE NEEDLE ASPIRATION, EBUS, 4R, B (SPECIMEN 2 OF 3, COLLECTED 07/19/16): MALIGNANT CELLS CONSISTENT WITH ADENOCARCINOMA.      07/21/2016 Pathology Results    FINE NEEDLE ASPIRATION, EBUS, BRUSHING, RIGHT MIDDLE LOBE, D (SPECIMEN 3 OF 3, COLLECTED 07/19/16): MALIGNANT CELLS CONSISTENT WITH ADENOCARCINOMA.      07/22/2016 Imaging    MRI brain- No evidence of intracranial metastases.      08/17/2016 - 09/20/2016 Chemotherapy    The patient had palonosetron (ALOXI) injection 0.25 mg, 0.25 mg, Intravenous,  Once, 1 of 1 cycle  CISplatin (PLATINOL) 123 mg in sodium chloride 0.9 % 500 mL chemo infusion, 50 mg/m2 = 123 mg, Intravenous,  Once, 1 of 1 cycle  etoposide (VEPESID) 120 mg in sodium chloride 0.9 % 500 mL chemo infusion, 50 mg/m2 = 120 mg, Intravenous,  Once, 1 of 1 cycle  fosaprepitant (EMEND) 150 mg, dexamethasone (DECADRON) 12 mg in sodium chloride 0.9 % 145 mL IVPB, , Intravenous,  Once, 1 of 1  cycle  ondansetron (ZOFRAN) 8 mg in sodium chloride 0.9 % 50 mL IVPB, , Intravenous,  Once, 2 of 6 cycles  for chemotherapy treatment.        08/17/2016 -  Radiation Therapy         11/15/2016 Imaging    CT CAP- Interval response to therapy. The previously demonstrated mediastinal and right hilar adenopathy and resulting right middle lobe atelectasis have all improved. 2. No discrete residual lung masses are identified. There is new multifocal ground-glass opacity within the right lower lobe, and to a lesser extent in the left upper lobe. These are probably inflammatory/treatment related. 3. No evidence of abdominopelvic metastatic disease. Stable prominent lymph nodes in the upper abdomen, likely reactive. 4. Decompressed mid SVC without specific signs of SVC occlusion.      12/02/2016 -  Chemotherapy    Imfinzi (durvalumab) immunotherapy every 2 weeks x up to 1 year      01/03/2017 Procedure    EGD by Dr. [name], colonoscopy aborted as "patient forgot to take other half of preparation). Esophagitis. likely radiation-induced ?Dilated.  Erythematous mucosa in the stomach. Biopsied. Normal duodenal bulb and second portion of the duodenum.      02/14/2017 Imaging    CT chest- 1. Development of a small to moderate right-sided pleural effusion with minimal loculation anteriorly. 2. Worsened right-sided aeration with right middle and lower lobe consolidation. As this has a geographic distribution, this could be radiation induced or represent infection. Depending on clinical concern of progressive  disease, thoracentesis and/or PET may be informative. 3. Development of mild right paratracheal adenopathy, most likely reactive. Recommend attention on follow-up. 4. Subtle findings which are highly suspicious for mild cirrhosis. Upper abdominal adenopathy is similar and likely reactive. 5. Persistent right lower and improved left upper lobe ground-glass opacities are favored to be  infectious or inflammatory.      06/01/2017 Imaging    CT chest  IMPRESSION: 1. Evolving radiation changes in the medial right hemithorax. 2. Stable moderate size right pleural effusion without definite nodular components. 3. No signs of local recurrence or progressive metastatic disease. No residual enlarged thoracic lymph nodes. 4. Stable appearance of the visualized upper abdomen with probable cirrhosis and reactive adenopathy in the porta hepatis.      07/15/2017 Procedure    Therapeutic thoracentesis performed today; 1.3 L removed.  Fluid sent for cytology.       07/15/2017 Pathology Results    Pleural fluid NEGATIVE for malignancy by cytology.       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 Paramediastinal radiation change and loculated right pleural effusion appear unchanged from 07/14/2017. 2. Borderline prevascular lymph node within the anterior mediastinum is mildly increased in size from previous exam. 3. There is a new 5 mm lung nodule within the left lower lobe. Nonspecific in appearance. Attention on follow-up imaging advise. Other small nonspecific nodules in the left lung are stable. 4. Morphologic features of the liver compatible with cirrhosis. Enlarged upper abdominal lymph nodes are nonspecific and likely reactive. 5. Stable indeterminate low-attenuation focus in the posterior right liver dome. Previously characterized on MRI from 07/29/2017 as reflecting post treatment changes from external beam radiation to the lung. 6.  Emphysema (ICD10-J43.9).       INTERVAL HISTORY: Erik [name] 56 y.o. male returns for followup of progressive non-small cell lung cancer, adenocarcinoma type. Patient presents today for cycle 4 of tecentriq and follow up. Patient states that he has been feeling some increased shortness of breath. He continues to smoke 1/2 ppd but states he  really wants to quit and get back on chantix which he took before. He stated he tolerated chantix well last time except for more dreams at night. He denies any chest pain, diarrhea, nausea, vomiting, headaches, blurry vision, weakness. October 31, 2017  Patient complains of shortness of breath on exertion.  Worried about reaccumulating pleural fluid Had a nausea for day or 2 after chemotherapy also complains of constipation.  Patient is taking MiraLAX and milk of magnesia but not helping too much. Feeling somewhat weak and tired here for continuation of treatment.  Patient has resumed smoking now trying Chantix Review of Systems  Constitutional: Negative.  Negative for chills, fever and weight loss.  HENT: Negative for congestion.   Eyes: Negative.   Respiratory: Positive for shortness of breath. Negative for cough.   Cardiovascular: Negative.  Negative for chest pain.  Gastrointestinal: Negative.  Negative for blood in stool, constipation, diarrhea, melena, nausea and vomiting.  Genitourinary: Negative.   Musculoskeletal: Negative.   Skin: Negative.   Neurological: Negative.  Negative for weakness.  Endo/Heme/Allergies: Negative.   Psychiatric/Behavioral: Negative.   Shortness of breath on exertion.  Constipation.  weak and tired  Past Medical History:  Diagnosis Date  . Arnold-Chiari syndrome ([place])   . Arthritis   . Asthma   . Chronic back pain   . Depression   .  Dyspnea    with exertion   . GERD (gastroesophageal reflux disease)   . Hypertension   . Seasonal allergies   . Spinal stenosis of lumbar region   . 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 Wheezing     Past Surgical History:  Procedure Laterality Date  . BACK SURGERY     5 total  . BIOPSY  01/03/2017   Procedure: BIOPSY;  Surgeon: Daneil Dolin, MD;  [facility];  Service: Endoscopy;;  gastric  . COLONOSCOPY WITH PROPOFOL N/A 04/04/2017   Procedure: COLONOSCOPY WITH PROPOFOL;   Surgeon: Daneil Dolin, MD;  [facility];  Service: Endoscopy;  Laterality: N/A;  8:45am  . ESOPHAGOGASTRODUODENOSCOPY (EGD) WITH PROPOFOL N/A 01/03/2017   Procedure: ESOPHAGOGASTRODUODENOSCOPY (EGD) WITH PROPOFOL;  Surgeon: Daneil Dolin, MD;  [facility];  Service: Endoscopy;  Laterality: N/A;  . Venia Minks DILATION N/A 01/03/2017   Procedure: Venia Minks DILATION;  Surgeon: Daneil Dolin, MD;  [facility];  Service: Endoscopy;  Laterality: N/A;  . MULTIPLE EXTRACTIONS WITH ALVEOLOPLASTY N/A 07/08/2014   Procedure: MULTIPLE EXTRACION WITH ALVEOLOPLASTY with EXCISION LESION RIGHT SIDE OF TONGUE;  Surgeon: Gae Bon, DDS;  Location: [facility];  Service: Oral Surgery;  Laterality: N/A;  . PORTACATH PLACEMENT Right 07/30/2016   Procedure: INSERTION PORT-A-CATH;  Surgeon: Vickie Epley, MD;  Location: [facility];  Service: Vascular;  Laterality: Right;  [name] VIDEO BRONCHOSCOPY WITH ENDOBRONCHIAL ULTRASOUND N/A 07/19/2016   Procedure: VIDEO BRONCHOSCOPY WITH ENDOBRONCHIAL ULTRASOUND;  Surgeon: [name], MD;  Location: [facility];  Service: Thoracic;  Laterality: N/A;    Family History  Problem Relation Age of Onset  . Cancer Mother        breast cancer  . Heart failure Brother   . Colon cancer Neg Hx        not sure, ?grandfather and/or uncle    Social History   Socioeconomic History  . Marital status: Divorced    Spouse name: Not on file  . Number of children: Not on file  . Years of education: Not on file  . Highest education level: Not on file  Social Needs  . Financial resource strain: Not on file  . Food insecurity - worry: Not on file  . Food insecurity - inability: Not on file  . Transportation needs - medical: Not on file  . Transportation needs - non-medical: Not on file  Occupational History  . Not on file  Tobacco Use  . Smoking status: Current Every Day Smoker    Packs/day: 0.25    Years: 38.00    Pack years: 9.50    Types: Cigarettes   . Smokeless tobacco: Former Systems developer    Types: Chew  . Tobacco comment: using chantix- smoking once in a while  Substance and Sexual Activity  . Alcohol use: Yes    Alcohol/week: 1.2 oz    Types: 2 Cans of beer per week    Comment: reports drinking 0-2 beers a week  . Drug use: Yes    Frequency: 7.0 times per week    Types: Marijuana    Comment: daily for cancer; pt denies any use at present time 08/23/17  . Sexual activity: Not Currently  Other Topics Concern  . Not on file  Social History Narrative  . Not on file     PHYSICAL EXAMINATION  ECOG PERFORMANCE STATUS: 1 - Symptomatic but completely ambulatory  There were no vitals  filed for this visit. Constitutional: Well-developed, well-nourished, and in no distress.   HENT:  Head: Normocephalic and atraumatic.  Mouth/Throat: No oropharyngeal exudate. Mucosa moist. Eyes: Pupils are equal, round, and reactive to light. Conjunctivae are normal. No scleral icterus.  Neck: Normal range of motion. Neck supple. No JVD present.  Cardiovascular: Normal rate, regular rhythm and normal heart sounds.  Exam reveals no gallop and no friction rub.   No murmur heard. Pulmonary/Chest: Effort normal. No respiratory distress. Bilateral rhonchi. Abdominal: Soft. Bowel sounds are normal. No distension. There is no tenderness. There is no guarding.  Musculoskeletal: No edema or tenderness.  Lymphadenopathy:    No cervical adenopathy. No supraclavicular lymphadenopathy. Neurological: Alert and oriented to person, place, and time. No cranial nerve deficit.  Skin: Skin is warm and dry. No rash noted. No erythema. No pallor.  Psychiatric: Affect and judgment normal.     LABORATORY DATA: CBC    Component Value Date/Time   WBC 5.0 10/06/2017 1051   RBC 4.25 10/06/2017 1051   HGB 13.6 10/06/2017 1051   HCT 41.4 10/06/2017 1051   PLT 137 (L) 10/06/2017 1051   MCV 97.4 10/06/2017 1051   MCH 32.0 10/06/2017 1051   MCHC 32.9 10/06/2017 1051   RDW  15.7 (H) 10/06/2017 1051   LYMPHSABS 1.2 10/06/2017 1051   MONOABS 0.4 10/06/2017 1051   EOSABS 0.1 10/06/2017 1051   BASOSABS 0.0 10/06/2017 1051      Chemistry      Component Value Date/Time   NA 137 10/06/2017 1051   K 3.8 10/06/2017 1051   CL 102 10/06/2017 1051   CO2 29 10/06/2017 1051   BUN 7 10/06/2017 1051   CREATININE 0.77 10/06/2017 1051      Component Value Date/Time   CALCIUM 8.5 (L) 10/06/2017 1051   ALKPHOS 155 (H) 10/06/2017 1051   AST 30 10/06/2017 1051   ALT 25 10/06/2017 1051   BILITOT 0.5 10/06/2017 1051        PENDING LABS:   RADIOGRAPHIC STUDIES:  Ct Chest W Contrast  Result Date: 10/04/2017 CLINICAL DATA:  Followup lung cancer. Squamous cell carcinoma of the right lung. EXAM: CT CHEST, ABDOMEN, AND PELVIS WITH CONTRAST TECHNIQUE: Multidetector CT imaging of the chest, abdomen and pelvis was performed following the standard protocol during bolus administration of intravenous contrast. CONTRAST:  167m ISOVUE-300 IOPAMIDOL (ISOVUE-300) INJECTION 61% COMPARISON:  07/14/2017 and 8/ 14/18 FINDINGS: CT CHEST FINDINGS Cardiovascular: Normal heart size. No pericardial effusion. Aortic atherosclerosis. No pericardial effusion. Mediastinum/Nodes: The trachea appears patent and is midline. Normal appearance of the esophagus. Index pre-vascular node measures 1 cm, image 20 of series 2. On 07/14/2017 this measured 8 mm. No axillary or supraclavicular adenopathy. Lungs/Pleura: Mild changes of centrilobular emphysema. Moderate volume loculated right pleural effusion is similar to previous exam. Paramediastinal radiation change is identified within the right lung as before. 5 mm nodule within the left upper lobe is unchanged from previous exam, image 40 of series 4. 4 mm perifissural nodule in the left midlung is unchanged from previous exam, image number 81 of series 4. 5 mm nodule in the left lower lobe is new from the previous study, indeterminate. Stable subpleural  nodule within the right upper lung zone measuring 5 mm, image 52 of series 4. Musculoskeletal: No aggressive lytic or sclerotic bone lesions. CT ABDOMEN PELVIS FINDINGS Hepatobiliary: No focal liver abnormality. There is hypertrophy of the caudate lobe and lateral segment of left lobe of liver. The contour the liver is mildly  irregular. Ill defined focus of low attenuation in the posterior right liver dome measures 2.1 x 1.9 cm, image 49 of series 2. Unchanged from previous exam. The gallbladder appears normal. No biliary dilatation. Pancreas: Normal appearance of the pancreas. Spleen: Normal in size without focal abnormality. Adrenals/Urinary Tract: The adrenal glands are normal. The kidneys are both unremarkable. Urinary bladder appears normal. The bladder appears normal. Stomach/Bowel: The stomach is normal. No dilated loops of small or large bowel. The appendix is visualized and is unremarkable. Vascular/Lymphatic: Normal appearance of the abdominal aorta. Prominent upper abdominal lymph nodes are noted. Index porta hepatis node measures 1.3 cm, image 61 of series 2. Previously this measured the same. Portacaval node is also unchanged measuring 1.2 cm, image 61 of series 2. Reproductive:  Prostate gland is unremarkable. Other: No abdominal wall hernia or abnormality. No abdominopelvic ascites. Musculoskeletal: No aggressive lytic or sclerotic bone lesions. Status post posterior hardware fixation of L3 through S1. IMPRESSION: 1. Paramediastinal radiation change and loculated right pleural effusion appear unchanged from 07/14/2017. 2. Borderline prevascular lymph node within the anterior mediastinum is mildly increased in size from previous exam. 3. There is a new 5 mm lung nodule within the left lower lobe. Nonspecific in appearance. Attention on follow-up imaging advise. Other small nonspecific nodules in the left lung are stable. 4. Morphologic features of the liver compatible with cirrhosis. Enlarged upper  abdominal lymph nodes are nonspecific and likely reactive. 5. Stable indeterminate low-attenuation focus in the posterior right liver dome. Previously characterized on MRI from 07/29/2017 as reflecting post treatment changes from external beam radiation to the lung. 6.  Emphysema (ICD10-J43.9). Electronically Signed   By: Kerby Moors M.D.   On: 10/04/2017 10:55   Ct Abdomen Pelvis W Contrast  Result Date: 10/04/2017 CLINICAL DATA:  Followup lung cancer. Squamous cell carcinoma of the right lung. EXAM: CT CHEST, ABDOMEN, AND PELVIS WITH CONTRAST TECHNIQUE: Multidetector CT imaging of the chest, abdomen and pelvis was performed following the standard protocol during bolus administration of intravenous contrast. CONTRAST:  165m ISOVUE-300 IOPAMIDOL (ISOVUE-300) INJECTION 61% COMPARISON:  07/14/2017 and 8/ 14/18 FINDINGS: CT CHEST FINDINGS Cardiovascular: Normal heart size. No pericardial effusion. Aortic atherosclerosis. No pericardial effusion. Mediastinum/Nodes: The trachea appears patent and is midline. Normal appearance of the esophagus. Index pre-vascular node measures 1 cm, image 20 of series 2. On 07/14/2017 this measured 8 mm. No axillary or supraclavicular adenopathy. Lungs/Pleura: Mild changes of centrilobular emphysema. Moderate volume loculated right pleural effusion is similar to previous exam. Paramediastinal radiation change is identified within the right lung as before. 5 mm nodule within the left upper lobe is unchanged from previous exam, image 40 of series 4. 4 mm perifissural nodule in the left midlung is unchanged from previous exam, image number 81 of series 4. 5 mm nodule in the left lower lobe is new from the previous study, indeterminate. Stable subpleural nodule within the right upper lung zone measuring 5 mm, image 52 of series 4. Musculoskeletal: No aggressive lytic or sclerotic bone lesions. CT ABDOMEN PELVIS FINDINGS Hepatobiliary: No focal liver abnormality. There is hypertrophy  of the caudate lobe and lateral segment of left lobe of liver. The contour the liver is mildly irregular. Ill defined focus of low attenuation in the posterior right liver dome measures 2.1 x 1.9 cm, image 49 of series 2. Unchanged from previous exam. The gallbladder appears normal. No biliary dilatation. Pancreas: Normal appearance of the pancreas. Spleen: Normal in size without focal abnormality. Adrenals/Urinary Tract: The adrenal  glands are normal. The kidneys are both unremarkable. Urinary bladder appears normal. The bladder appears normal. Stomach/Bowel: The stomach is normal. No dilated loops of small or large bowel. The appendix is visualized and is unremarkable. Vascular/Lymphatic: Normal appearance of the abdominal aorta. Prominent upper abdominal lymph nodes are noted. Index porta hepatis node measures 1.3 cm, image 61 of series 2. Previously this measured the same. Portacaval node is also unchanged measuring 1.2 cm, image 61 of series 2. Reproductive:  Prostate gland is unremarkable. Other: No abdominal wall hernia or abnormality. No abdominopelvic ascites. Musculoskeletal: No aggressive lytic or sclerotic bone lesions. Status post posterior hardware fixation of L3 through S1. IMPRESSION: 1. Paramediastinal radiation change and loculated right pleural effusion appear unchanged from 07/14/2017. 2. Borderline prevascular lymph node within the anterior mediastinum is mildly increased in size from previous exam. 3. There is a new 5 mm lung nodule within the left lower lobe. Nonspecific in appearance. Attention on follow-up imaging advise. Other small nonspecific nodules in the left lung are stable. 4. Morphologic features of the liver compatible with cirrhosis. Enlarged upper abdominal lymph nodes are nonspecific and likely reactive. 5. Stable indeterminate low-attenuation focus in the posterior right liver dome. Previously characterized on MRI from 07/29/2017 as reflecting post treatment changes from  external beam radiation to the lung. 6.  Emphysema (ICD10-J43.9). Electronically Signed   By: Kerby Moors M.D.   On: 10/04/2017 10:55     PATHOLOGY:    ASSESSMENT AND PLAN:  1. Stage IV ([id number]) adenocarcinoma of right lung, initially diagnosed with Stage IIIA (T3N2M0) disease on RML biopsy by Dr. Roxan Hockey on 07/21/2016.  He is S/P concomitant chemoXRT with Cisplatin/Etoposide (08/17/2016- 09/20/2016).  He was then transitioned to consolidative immunotherapy with Imfinzi, but then developed progression of disease on imaging in 07/2017.  His treatment was therefore transitioned to Tecentriq beginning on 08/04/2017.  Left supraclavicular biopsy on 08/23/17 demonstrated metastatic adenocarcinoma of lung. Not enough tissue on supraclavicular bx to send for Foundation One, therefore we ordered Liquid Foundation One from peripheral blood which genomid findins of MAP2K1 and TP53 mutation, MSI status undetermined.  -Continue tecentriq cycle 4 as scheduled today. Patient tolerating it well.  -I have reviewed his restaging CT scans in detail with him. Previous left supraclavicular and subpectoral lymph nodes have resolved, the loculated pleural effusion is moderate in size but stable. New left lower lobe lung 5 mm lung nodule which we will need to pay attention to in the future. Prevascular lymph node has slightly increased in size from 8 mm to 10 mm. No new metastatic disease. -Reviewed Foundation One Liquid results with him. -Plan to restage in 3 months in early February 2019.  2. Smoke cessation -I will prescribe chantix for the patient, which he states had worked previously.   3.  Constipation will add senna S one tablet twice a day along with MiraLAX 4.  Shortness of breath will get a chest x-ray done tomorrow if there is reaccumulation of fluid he might benefit with thoracentesis then a repeat CT scan to assess progression of disease needs to be done prior to next appointment     THERAPY PLAN:   Continue with treatment as planned.    All questions were answered. The patient knows to call the clinic with any problems, questions or concerns. We can certainly see the patient much sooner if necessary.   This note is electronically signed by: Forest Gleason, MD 10/31/2017 10:28 AM

## 2017-10-31 NOTE — Patient Instructions (Signed)
[facility] Discharge Instructions for Patients Receiving Chemotherapy  Today you received the following chemotherapy agents imfinzi today.    If you develop nausea and vomiting that is not controlled by your nausea medication, call the clinic.   BELOW ARE SYMPTOMS THAT SHOULD BE REPORTED IMMEDIATELY:  *FEVER GREATER THAN 100.5 F  *CHILLS WITH OR WITHOUT FEVER  NAUSEA AND VOMITING THAT IS NOT CONTROLLED WITH YOUR NAUSEA MEDICATION  *UNUSUAL SHORTNESS OF BREATH  *UNUSUAL BRUISING OR BLEEDING  TENDERNESS IN MOUTH AND THROAT WITH OR WITHOUT PRESENCE OF ULCERS  *URINARY PROBLEMS  *BOWEL PROBLEMS  UNUSUAL RASH Items with * indicate a potential emergency and should be followed up as soon as possible.  Feel free to call the clinic should you have any questions or concerns. The clinic phone number is [phone].  Please show the [facility] at check-in to the Emergency Department and triage nurse.

## 2017-10-31 NOTE — Progress Notes (Signed)
To treatment room for labs and chemo after oncology follow up visit.  Unable to draw blood from port.  No complaints of pain with flush and no bruising or swelling noted at site.  Labs drawn from right antecubital with no complaints.  Complains of back pain and uses home pain medications for relief.  PRN nausea and SOB with exertion.  No changes in neuropathy with fingers and toes. Able to use small buttons, jar lids, and zippers without difficulty.   Patient tolerated chemotherapy with no complaints voiced.  Port site clean and dry with no bruising or swelling noted at site.  Band aid applied.  VSS with discharge and left ambulatory with no s/s of distress noted.

## 2017-11-18 ENCOUNTER — Ambulatory Visit (HOSPITAL_COMMUNITY)
Admission: RE | Admit: 2017-11-18 | Discharge: 2017-11-18 | Disposition: A | Discharge status: Home / Self Care | Payer: Medicare Other | Source: Ambulatory Visit | Attending: Oncology | Admitting: Oncology

## 2017-11-18 DIAGNOSIS — R0602 Shortness of breath: Secondary | ICD-10-CM

## 2017-11-18 DIAGNOSIS — J90 Pleural effusion, not elsewhere classified: Secondary | ICD-10-CM | POA: Insufficient documentation

## 2017-11-18 DIAGNOSIS — J9811 Atelectasis: Secondary | ICD-10-CM | POA: Diagnosis not present

## 2017-11-21 ENCOUNTER — Ambulatory Visit (HOSPITAL_COMMUNITY)
Admission: RE | Admit: 2017-11-21 | Discharge: 2017-11-21 | Disposition: A | Discharge status: Home / Self Care | Payer: Medicare Other | Source: Ambulatory Visit | Attending: Diagnostic Radiology | Admitting: Diagnostic Radiology

## 2017-11-21 ENCOUNTER — Ambulatory Visit (HOSPITAL_COMMUNITY)
Admission: RE | Admit: 2017-11-21 | Discharge: 2017-11-21 | Disposition: A | Discharge status: Home / Self Care | Payer: Medicare Other | Source: Ambulatory Visit | Attending: Oncology | Admitting: Oncology

## 2017-11-21 ENCOUNTER — Encounter (HOSPITAL_COMMUNITY): Payer: Self-pay | Admitting: Oncology

## 2017-11-21 ENCOUNTER — Encounter (HOSPITAL_BASED_OUTPATIENT_CLINIC_OR_DEPARTMENT_OTHER): Payer: Medicare Other | Admitting: Oncology

## 2017-11-21 ENCOUNTER — Encounter (HOSPITAL_COMMUNITY): Payer: Self-pay

## 2017-11-21 ENCOUNTER — Encounter (HOSPITAL_COMMUNITY): Payer: Medicare Other | Attending: Oncology

## 2017-11-21 ENCOUNTER — Other Ambulatory Visit: Payer: Self-pay

## 2017-11-21 VITALS — BP 114/60 | HR 80 | Temp 97.6°F | Resp 18 | Wt 228.2 lb

## 2017-11-21 DIAGNOSIS — F17218 Nicotine dependence, cigarettes, with other nicotine-induced disorders: Secondary | ICD-10-CM | POA: Diagnosis not present

## 2017-11-21 DIAGNOSIS — J9811 Atelectasis: Secondary | ICD-10-CM | POA: Diagnosis not present

## 2017-11-21 DIAGNOSIS — Z9889 Other specified postprocedural states: Secondary | ICD-10-CM | POA: Diagnosis not present

## 2017-11-21 DIAGNOSIS — J90 Pleural effusion, not elsewhere classified: Secondary | ICD-10-CM | POA: Insufficient documentation

## 2017-11-21 DIAGNOSIS — C3491 Malignant neoplasm of unspecified part of right bronchus or lung: Secondary | ICD-10-CM | POA: Diagnosis present

## 2017-11-21 DIAGNOSIS — R0602 Shortness of breath: Secondary | ICD-10-CM | POA: Diagnosis not present

## 2017-11-21 DIAGNOSIS — J918 Pleural effusion in other conditions classified elsewhere: Secondary | ICD-10-CM | POA: Diagnosis not present

## 2017-11-21 DIAGNOSIS — Z716 Tobacco abuse counseling: Secondary | ICD-10-CM

## 2017-11-21 DIAGNOSIS — D72820 Lymphocytosis (symptomatic): Secondary | ICD-10-CM | POA: Insufficient documentation

## 2017-11-21 DIAGNOSIS — Z5112 Encounter for antineoplastic immunotherapy: Secondary | ICD-10-CM

## 2017-11-21 LAB — COMPREHENSIVE METABOLIC PANEL
ALBUMIN: 3.3 g/dL — AB (ref 3.5–5.0)
ALT: 33 U/L (ref 17–63)
AST: 40 U/L (ref 15–41)
Alkaline Phosphatase: 151 U/L — ABNORMAL HIGH (ref 38–126)
Anion gap: 7 (ref 5–15)
BUN: 10 mg/dL (ref 6–20)
CHLORIDE: 99 mmol/L — AB (ref 101–111)
CO2: 31 mmol/L (ref 22–32)
CREATININE: 0.89 mg/dL (ref 0.61–1.24)
Calcium: 8.8 mg/dL — ABNORMAL LOW (ref 8.9–10.3)
GFR calc Af Amer: >60 mL/min (ref >60)
GFR calc non Af Amer: >60 mL/min (ref >60)
GLUCOSE: 116 mg/dL — AB (ref 65–99)
POTASSIUM: 4 mmol/L (ref 3.5–5.1)
SODIUM: 137 mmol/L (ref 135–145)
Total Bilirubin: 0.5 mg/dL (ref 0.3–1.2)
Total Protein: 7.6 g/dL (ref 6.5–8.1)

## 2017-11-21 LAB — CBC WITH DIFFERENTIAL/PLATELET
Basophils Absolute: 0 10*3/uL (ref 0.0–0.1)
Basophils Relative: 0 %
EOS PCT: 1 %
Eosinophils Absolute: 0.1 10*3/uL (ref 0.0–0.7)
HCT: 44.3 % (ref 39.0–52.0)
Hemoglobin: 14.6 g/dL (ref 13.0–17.0)
LYMPHS ABS: 1.9 10*3/uL (ref 0.7–4.0)
LYMPHS PCT: 25 %
MCH: 32.4 pg (ref 26.0–34.0)
MCHC: 33 g/dL (ref 30.0–36.0)
MCV: 98.4 fL (ref 78.0–100.0)
MONOS PCT: 10 %
Monocytes Absolute: 0.8 10*3/uL (ref 0.1–1.0)
Neutro Abs: 4.9 10*3/uL (ref 1.7–7.7)
Neutrophils Relative %: 64 %
PLATELETS: 169 10*3/uL (ref 150–400)
RBC: 4.5 MIL/uL (ref 4.22–5.81)
RDW: 14.8 % (ref 11.5–15.5)
WBC: 7.7 10*3/uL (ref 4.0–10.5)

## 2017-11-21 MED ORDER — HEPARIN SOD (PORK) LOCK FLUSH 100 UNIT/ML IV SOLN
500.0000 [IU] | Freq: Once | INTRAVENOUS | Status: AC | PRN
  Administered 2017-11-21: 500 [IU]

## 2017-11-21 MED ORDER — SODIUM CHLORIDE 0.9% FLUSH
10.0000 mL | INTRAVENOUS | Status: DC | PRN
  Administered 2017-11-21: 10 mL
  Filled 2017-11-21: qty 10

## 2017-11-21 MED ORDER — SODIUM CHLORIDE 0.9 % IV SOLN
1200.0000 mg | Freq: Once | INTRAVENOUS | Status: AC
  Administered 2017-11-21: 1200 mg via INTRAVENOUS
  Filled 2017-11-21: qty 20

## 2017-11-21 MED ORDER — IPRATROPIUM-ALBUTEROL 0.5-2.5 (3) MG/3ML IN SOLN
3.0000 mL | Freq: Four times a day (QID) | RESPIRATORY_TRACT | Status: DC
  Administered 2017-11-21: 3 mL via RESPIRATORY_TRACT
  Filled 2017-11-21 (×2): qty 3

## 2017-11-21 MED ORDER — SODIUM CHLORIDE 0.9 % IV SOLN
Freq: Once | INTRAVENOUS | Status: AC
  Administered 2017-11-21: 12:00:00 via INTRAVENOUS

## 2017-11-21 MED ORDER — IPRATROPIUM-ALBUTEROL 0.5-2.5 (3) MG/3ML IN SOLN
3.0000 mL | Freq: Once | RESPIRATORY_TRACT | Status: AC
  Administered 2017-11-21: 3 mL via RESPIRATORY_TRACT

## 2017-11-21 NOTE — Progress Notes (Signed)
Patient seen for oncology follow up visit.  Patient stated numbness and tingling in fingers and toes but no changes or worsening.  Good appetite and rates pain 4 to 5 on pain scale for back.  Uses pain medications for relief.    Labs reviewed wit Dr. Talbert Cage and ok to treat today.   Patient tolerated duoneb treatment with no complaints.  No complaints with breathing after treatment or before discharge.  Tolerated chemotherapy with no complaints voiced.  Port site clean and dry with no bruising or swelling noted at site.  Band aid applied.  VSS with discharge and left ambulatory with no s/s of distress noted.

## 2017-11-21 NOTE — Patient Instructions (Signed)
[facility] Discharge Instructions for Patients Receiving Chemotherapy  Today you received the following chemotherapy agents tecentriq.   If you develop nausea and vomiting that is not controlled by your nausea medication, call the clinic.   BELOW ARE SYMPTOMS THAT SHOULD BE REPORTED IMMEDIATELY:  *FEVER GREATER THAN 100.5 F  *CHILLS WITH OR WITHOUT FEVER  NAUSEA AND VOMITING THAT IS NOT CONTROLLED WITH YOUR NAUSEA MEDICATION  *UNUSUAL SHORTNESS OF BREATH  *UNUSUAL BRUISING OR BLEEDING  TENDERNESS IN MOUTH AND THROAT WITH OR WITHOUT PRESENCE OF ULCERS  *URINARY PROBLEMS  *BOWEL PROBLEMS  UNUSUAL RASH Items with * indicate a potential emergency and should be followed up as soon as possible.  Feel free to call the clinic should you have any questions or concerns. The clinic phone number is [phone].  Please show the [facility] at check-in to the Emergency Department and triage nurse.

## 2017-11-21 NOTE — Progress Notes (Signed)
Thoracentesis complete c/o pain upon inhalation. MD aware, going for chest xray. Vitals stable.

## 2017-11-21 NOTE — Progress Notes (Signed)
Erik [name], [address] [place] [address]  Adenocarcinoma of right lung [facility]) - Plan: US THORACENTESIS ASP PLEURAL SPACE W/IMG GUIDE, CT Abdomen Pelvis W Contrast, CT Chest W Contrast, DISCONTINUED: sodium chloride flush (NS) 0.9 % injection 10 mL, DISCONTINUED: heparin lock flush 100 unit/mL, DISCONTINUED: 0.9 %  sodium chloride infusion, DISCONTINUED: atezolizumab (TECENTRIQ) 1,200 mg in sodium chloride 0.9 % 250 mL chemo infusion  CURRENT THERAPY: Tecentriq beginning on 08/04/2017    Adenocarcinoma of right lung ([place])   07/01/2016 PET scan    6.3 x 3.7 cm right lower lobe mass, suspicious for primary bronchogenic neoplasm, as described above. Hypermetabolic thoracic nodal metastases, as above. Additional right perihilar hypermetabolism, indeterminate. Associated right middle lobe atelectasis/collapse.      07/19/2016 Procedure    Bronchoscopy with brushings and biopsies and endobronchial ultrasound with mediastinal lymph node aspirations by Dr. Roxan Hockey      07/21/2016 Pathology Results    Lung, biopsy, Right Middle Lobe - LUNG TISSUE WITH SQUAMOUS METAPLASIA. - NO MALIGNANCY IDENTIFIED.      07/21/2016 Pathology Results    FINE NEEDLE ASPIRATION, ENDOSCOPIC (A) LEVEL 7 (SPECIMEN 1 OF 3, COLLECTED ON 07/19/16): MALIGNANT CELLS CONSISTENT WITH ADENOCARCINOMA.      07/21/2016 Pathology Results    FINE NEEDLE ASPIRATION, EBUS, 4R, B (SPECIMEN 2 OF 3, COLLECTED 07/19/16): MALIGNANT CELLS CONSISTENT WITH ADENOCARCINOMA.      07/21/2016 Pathology Results    FINE NEEDLE ASPIRATION, EBUS, BRUSHING, RIGHT MIDDLE LOBE, D (SPECIMEN 3 OF 3, COLLECTED 07/19/16): MALIGNANT CELLS CONSISTENT WITH ADENOCARCINOMA.      07/22/2016 Imaging    MRI brain- No evidence of intracranial metastases.      08/17/2016 - 09/20/2016 Chemotherapy    The patient had palonosetron (ALOXI) injection 0.25 mg, 0.25 mg, Intravenous,  Once, 1 of 1 cycle  CISplatin (PLATINOL) 123  mg in sodium chloride 0.9 % 500 mL chemo infusion, 50 mg/m2 = 123 mg, Intravenous,  Once, 1 of 1 cycle  etoposide (VEPESID) 120 mg in sodium chloride 0.9 % 500 mL chemo infusion, 50 mg/m2 = 120 mg, Intravenous,  Once, 1 of 1 cycle  fosaprepitant (EMEND) 150 mg, dexamethasone (DECADRON) 12 mg in sodium chloride 0.9 % 145 mL IVPB, , Intravenous,  Once, 1 of 1 cycle  ondansetron (ZOFRAN) 8 mg in sodium chloride 0.9 % 50 mL IVPB, , Intravenous,  Once, 2 of 6 cycles  for chemotherapy treatment.        08/17/2016 -  Radiation Therapy         11/15/2016 Imaging    CT CAP- Interval response to therapy. The previously demonstrated mediastinal and right hilar adenopathy and resulting right middle lobe atelectasis have all improved. 2. No discrete residual lung masses are identified. There is new multifocal ground-glass opacity within the right lower lobe, and to a lesser extent in the left upper lobe. These are probably inflammatory/treatment related. 3. No evidence of abdominopelvic metastatic disease. Stable prominent lymph nodes in the upper abdomen, likely reactive. 4. Decompressed mid SVC without specific signs of SVC occlusion.      12/02/2016 -  Chemotherapy    Imfinzi (durvalumab) immunotherapy every 2 weeks x up to 1 year      01/03/2017 Procedure    EGD by Dr. [name], colonoscopy aborted as "patient forgot to take other half of preparation). Esophagitis. likely radiation-induced ?Dilated.  Erythematous mucosa in the stomach. Biopsied. Normal duodenal bulb and second portion of the duodenum.  02/14/2017 Imaging    CT chest- 1. Development of a small to moderate right-sided pleural effusion with minimal loculation anteriorly. 2. Worsened right-sided aeration with right middle and lower lobe consolidation. As this has a geographic distribution, this could be radiation induced or represent infection. Depending on clinical concern of progressive disease, thoracentesis and/or PET  may be informative. 3. Development of mild right paratracheal adenopathy, most likely reactive. Recommend attention on follow-up. 4. Subtle findings which are highly suspicious for mild cirrhosis. Upper abdominal adenopathy is similar and likely reactive. 5. Persistent right lower and improved left upper lobe ground-glass opacities are favored to be infectious or inflammatory.      06/01/2017 Imaging    CT chest  IMPRESSION: 1. Evolving radiation changes in the medial right hemithorax. 2. Stable moderate size right pleural effusion without definite nodular components. 3. No signs of local recurrence or progressive metastatic disease. No residual enlarged thoracic lymph nodes. 4. Stable appearance of the visualized upper abdomen with probable cirrhosis and reactive adenopathy in the porta hepatis.      07/15/2017 Procedure    Therapeutic thoracentesis performed today; 1.3 L removed.  Fluid sent for cytology.       07/15/2017 Pathology Results    Pleural fluid NEGATIVE for malignancy by cytology.       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 Paramediastinal radiation change and loculated right pleural effusion appear unchanged from 07/14/2017. 2. Borderline prevascular lymph node within the anterior mediastinum is mildly increased in size from previous exam. 3. There is a new 5 mm lung nodule within the left lower lobe. Nonspecific in appearance. Attention on follow-up imaging advise. Other small nonspecific nodules in the left lung are stable. 4. Morphologic features of the liver compatible with cirrhosis. Enlarged upper abdominal lymph nodes are nonspecific and likely reactive. 5. Stable indeterminate low-attenuation focus in the posterior right liver dome. Previously characterized on MRI from 07/29/2017 as reflecting post treatment changes from external beam radiation to the  lung. 6.  Emphysema (ICD10-J43.9).       INTERVAL HISTORY: Erik [name] 56 y.o. male returns for followup of progressive non-small cell lung cancer, adenocarcinoma type. Patient presents today for cycle 7 of tecentriq and follow up. Patient states that he has been feeling some increased shortness of breath. He continues to smoke 1/2 ppd but is also taking chantix at the same time in an attempt to quit.He denies any chest pain, diarrhea, nausea, vomiting, headaches, blurry vision, weakness.  Review of Systems  Constitutional: Negative.  Negative for chills, fever and weight loss.  HENT: Negative for congestion.   Eyes: Negative.   Respiratory: Positive for shortness of breath. Negative for cough.   Cardiovascular: Negative.  Negative for chest pain.  Gastrointestinal: Negative.  Negative for blood in stool, constipation, diarrhea, melena, nausea and vomiting.  Genitourinary: Negative.   Musculoskeletal: Negative.   Skin: Negative.   Neurological: Negative.  Negative for weakness.  Endo/Heme/Allergies: Negative.   Psychiatric/Behavioral: Negative.     Past Medical History:  Diagnosis Date  . Arnold-Chiari syndrome ([place])   . Arthritis   . Asthma   . Chronic back pain   . Depression   . Dyspnea    with exertion   . GERD (gastroesophageal reflux disease)   . Hypertension   . Seasonal allergies   . Spinal stenosis of lumbar region   . 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 Wheezing     Past Surgical History:  Procedure Laterality Date  . BACK SURGERY     5 total  . BIOPSY  01/03/2017   Procedure: BIOPSY;  Surgeon: Daneil Dolin, MD;  [facility];  Service: Endoscopy;;  gastric  . COLONOSCOPY WITH PROPOFOL N/A 04/04/2017   Procedure: COLONOSCOPY WITH PROPOFOL;  Surgeon: Daneil Dolin, MD;  [facility];  Service: Endoscopy;  Laterality: N/A;  8:45am  . ESOPHAGOGASTRODUODENOSCOPY (EGD) WITH PROPOFOL N/A 01/03/2017   Procedure:  ESOPHAGOGASTRODUODENOSCOPY (EGD) WITH PROPOFOL;  Surgeon: Daneil Dolin, MD;  [facility]: [facility];  Service: Endoscopy;  Laterality: N/A;  . Venia Minks DILATION N/A 01/03/2017   Procedure: Venia Minks DILATION;  Surgeon: Daneil Dolin, MD;  [facility];  Service: Endoscopy;  Laterality: N/A;  . MULTIPLE EXTRACTIONS WITH ALVEOLOPLASTY N/A 07/08/2014   Procedure: MULTIPLE EXTRACION WITH ALVEOLOPLASTY with EXCISION LESION RIGHT SIDE OF TONGUE;  Surgeon: Gae Bon, DDS;  Location: [facility];  Service: Oral Surgery;  Laterality: N/A;  . PORTACATH PLACEMENT Right 07/30/2016   Procedure: INSERTION PORT-A-CATH;  Surgeon: Vickie Epley, MD;  Location: [facility];  Service: Vascular;  Laterality: Right;  [name] VIDEO BRONCHOSCOPY WITH ENDOBRONCHIAL ULTRASOUND N/A 07/19/2016   Procedure: VIDEO BRONCHOSCOPY WITH ENDOBRONCHIAL ULTRASOUND;  Surgeon: [name], MD;  Location: [facility] OR;  Service: Thoracic;  Laterality: N/A;    Family History  Problem Relation Age of Onset  . Cancer Mother        breast cancer  . Heart failure Brother   . Colon cancer Neg Hx        not sure, ?grandfather and/or uncle    Social History   Socioeconomic History  . Marital status: Divorced    Spouse name: None  . Number of children: None  . Years of education: None  . Highest education level: None  Social Needs  . Financial resource strain: None  . Food insecurity - worry: None  . Food insecurity - inability: None  . Transportation needs - medical: None  . Transportation needs - non-medical: None  Occupational History  . None  Tobacco Use  . Smoking status: Current Every Day Smoker    Packs/day: 0.25    Years: 38.00    Pack years: 9.50    Types: Cigarettes  . Smokeless tobacco: Former Systems developer    Types: Chew  . Tobacco comment: using chantix- smoking once in a while  Substance and Sexual Activity  . Alcohol use: Yes    Alcohol/week: 1.2 oz    Types: 2 Cans of beer per week    Comment: reports  drinking 0-2 beers a week  . Drug use: Yes    Frequency: 7.0 times per week    Types: Marijuana    Comment: daily for cancer; pt denies any use at present time 08/23/17  . Sexual activity: Not Currently  Other Topics Concern  . None  Social History Narrative  . None     PHYSICAL EXAMINATION  ECOG PERFORMANCE STATUS: 1 - Symptomatic but completely ambulatory Chemo RN vitals reviewed.  Constitutional: Well-developed, well-nourished, and in no distress.  Patient smells strongly of marijuana today. HENT:  Head: Normocephalic and atraumatic.  Mouth/Throat: No oropharyngeal exudate. Mucosa moist. Eyes: Pupils are equal, round, and reactive to light. Conjunctivae are normal. No scleral icterus.  Neck: Normal range of motion. Neck supple. No JVD present.  Cardiovascular: Normal rate, regular  rhythm and normal heart sounds.  Exam reveals no gallop and no friction rub.   No murmur heard. Pulmonary/Chest: Effort normal. No respiratory distress. Bilateral rhonchi and expiratory wheezing. Abdominal: Soft. Bowel sounds are normal. No distension. There is no tenderness. There is no guarding.  Musculoskeletal: No edema or tenderness.  Lymphadenopathy:    No cervical adenopathy. No supraclavicular lymphadenopathy. Neurological: Alert and oriented to person, place, and time. No cranial nerve deficit.  Skin: Skin is warm and dry. No rash noted. No erythema. No pallor.  Psychiatric: Affect and judgment normal.     LABORATORY DATA: CBC    Component Value Date/Time   WBC 7.7 11/21/2017 1039   RBC 4.50 11/21/2017 1039   HGB 14.6 11/21/2017 1039   HCT 44.3 11/21/2017 1039   PLT 169 11/21/2017 1039   MCV 98.4 11/21/2017 1039   MCH 32.4 11/21/2017 1039   MCHC 33.0 11/21/2017 1039   RDW 14.8 11/21/2017 1039   LYMPHSABS 1.9 11/21/2017 1039   MONOABS 0.8 11/21/2017 1039   EOSABS 0.1 11/21/2017 1039   BASOSABS 0.0 11/21/2017 1039      Chemistry      Component Value Date/Time   NA 137  11/21/2017 1039   K 4.0 11/21/2017 1039   CL 99 (L) 11/21/2017 1039   CO2 31 11/21/2017 1039   BUN 10 11/21/2017 1039   CREATININE 0.89 11/21/2017 1039      Component Value Date/Time   CALCIUM 8.8 (L) 11/21/2017 1039   ALKPHOS 151 (H) 11/21/2017 1039   AST 40 11/21/2017 1039   ALT 33 11/21/2017 1039   BILITOT 0.5 11/21/2017 1039        PENDING LABS:   RADIOGRAPHIC STUDIES:  Dg Chest 1 View  Result Date: 11/21/2017 CLINICAL DATA:  Right pleural effusion.  Status post thoracentesis. EXAM: Right lower lobe and in the medial aspect of the right middle lobe. : Chest x-ray dated 11/18/2017 and chest CTs dated 10/04/2017, 08/22/2017 and 07/14/2017 FINDINGS: There is no pneumothorax after right thoracentesis. There is a small residual right pleural effusion. There is chronic atelectasis in the right lower lobe and medial aspect of the right middle lobe, probably secondary to radiation therapy. Left lung is clear. No acute bone abnormality. Port-A-Cath appears unchanged. IMPRESSION: 1. Diminished now small right pleural effusion. 2. No pneumothorax after thoracentesis. 3. Chronic atelectasis in the right lung as described. Electronically Signed   By: Lorriane Shire M.D.   On: 11/21/2017 13:59   Dg Chest 1 View  Result Date: 11/18/2017 CLINICAL DATA:  Shortness of breath. EXAM: CHEST 1 VIEW COMPARISON:  CT 10/04/2017. FINDINGS: PowerPort catheter with lead tip projected superior vena cava. Heart size stable. Prominent right pleural effusion without interim change. Persistent right base atelectasis without interim change. No pneumothorax. No acute bony abnormality . IMPRESSION: 1. PowerPort catheter stable position. 2. Persistent stable right-sided pleural effusion. Persistent right base atelectasis. Significant change Electronically Signed   By: Marcello Moores  Register   On: 11/18/2017 15:38   US Thoracentesis Asp Pleural Space W/img Guide  Result Date: 11/21/2017 INDICATION: Right pleural  effusion. EXAM: ULTRASOUND GUIDED RIGHT THORACENTESIS MEDICATIONS: None. COMPLICATIONS: None immediate. PROCEDURE: An ultrasound guided thoracentesis was thoroughly discussed with the patient and questions answered. The benefits, risks, alternatives and complications were also discussed. The patient understands and wishes to proceed with the procedure. Written consent was obtained. Ultrasound was performed to localize and mark an adequate pocket of fluid in the RIGHT chest. The area was then prepped and draped  in the normal sterile fashion. 1% Lidocaine was used for local anesthesia. Under ultrasound guidance a Yueh catheter was introduced. Thoracentesis was performed. The catheter was removed and a dressing applied. FINDINGS: A total of approximately 800 cc of blood-tinged yellow fluid was removed. Samples were sent to the laboratory as requested by the clinical team. IMPRESSION: Successful ultrasound guided right thoracentesis yielding 800 cc of pleural fluid. Electronically Signed   By: Lorriane Shire M.D.   On: 11/21/2017 14:00     PATHOLOGY:    ASSESSMENT AND PLAN:  1. Stage IV ([id number]) adenocarcinoma of right lung, initially diagnosed with Stage IIIA (T3N2M0) disease on RML biopsy by Dr. Roxan Hockey on 07/21/2016.  He is S/P concomitant chemoXRT with Cisplatin/Etoposide (08/17/2016- 09/20/2016).  He was then transitioned to consolidative immunotherapy with Imfinzi, but then developed progression of disease on imaging in 07/2017.  His treatment was therefore transitioned to Tecentriq beginning on 08/04/2017.  Left supraclavicular biopsy on 08/23/17 demonstrated metastatic adenocarcinoma of lung. Not enough tissue on supraclavicular bx to send for Foundation One, therefore we ordered Liquid Foundation One from peripheral blood which genomid findins of MAP2K1 and TP53 mutation, MSI status undetermined.  -Continue tecentriq cycle 7 as scheduled today. Patient tolerating it well.  -Last CT C/A/P on 10/04/17  demonstrated that the previous left supraclavicular and subpectoral lymph nodes have resolved, the loculated pleural effusion is moderate in size but stable. New left lower lobe lung 5 mm lung nodule which we will need to pay attention to in the future. Prevascular lymph node has slightly increased in size from 8 mm to 10 mm. No new metastatic disease. -I have ordered his 3 month restaging scans and they are scheduled for 12/28/17. -Reviewed cxr from 11/18/17 in detail with the patient. It demonstrated prominent right pleural effusion without interim change and persistent right base atelectasis without interim change. I do not believe his SOB is due to pneumonitis from tecentriq and may be due to his right pleural effusion. Therefore  I have arranged for patient to undergo a diagnostic/therapeutic thoracentesis later today by IR. Will send pleural fluid for cytology.  -Will give him a duoneb treatment today since he has bad wheezing on PE today.  2. Smoke cessation -Continue efforts for smoke cessation. -Continue chantix.   RTC in 6 weeks for follow up to review restaging scans. Continue tecentriq q3weeks.      THERAPY PLAN:  Continue with treatment as planned.    All questions were answered. The patient knows to call the clinic with any problems, questions or concerns. We can certainly see the patient much sooner if necessary.   This note is electronically signed by: Twana First, MD 11/21/2017 3:14 PM

## 2017-12-07 ENCOUNTER — Other Ambulatory Visit (HOSPITAL_COMMUNITY): Payer: Self-pay | Admitting: Oncology

## 2017-12-09 ENCOUNTER — Other Ambulatory Visit (HOSPITAL_COMMUNITY): Payer: Self-pay | Admitting: *Deleted

## 2017-12-09 DIAGNOSIS — C3491 Malignant neoplasm of unspecified part of right bronchus or lung: Secondary | ICD-10-CM

## 2017-12-09 MED ORDER — VARENICLINE TARTRATE 0.5 MG PO TABS: ORAL_TABLET | ORAL | 0 refills | Status: DC

## 2017-12-12 ENCOUNTER — Inpatient Hospital Stay (HOSPITAL_COMMUNITY): Payer: Medicare Other

## 2017-12-12 ENCOUNTER — Inpatient Hospital Stay (HOSPITAL_BASED_OUTPATIENT_CLINIC_OR_DEPARTMENT_OTHER): Payer: Medicare Other | Admitting: Hematology and Oncology

## 2017-12-12 ENCOUNTER — Other Ambulatory Visit: Payer: Self-pay

## 2017-12-12 ENCOUNTER — Encounter (HOSPITAL_COMMUNITY): Payer: Self-pay | Admitting: Hematology and Oncology

## 2017-12-12 ENCOUNTER — Inpatient Hospital Stay (HOSPITAL_COMMUNITY): Payer: Medicare Other | Attending: Hematology and Oncology

## 2017-12-12 VITALS — BP 112/72 | HR 91 | Temp 98.2°F | Resp 20

## 2017-12-12 VITALS — BP 109/75 | HR 107 | Temp 98.5°F | Resp 20 | Ht 72.0 in | Wt 222.5 lb

## 2017-12-12 DIAGNOSIS — C3491 Malignant neoplasm of unspecified part of right bronchus or lung: Secondary | ICD-10-CM

## 2017-12-12 DIAGNOSIS — M48061 Spinal stenosis, lumbar region without neurogenic claudication: Secondary | ICD-10-CM | POA: Insufficient documentation

## 2017-12-12 DIAGNOSIS — R911 Solitary pulmonary nodule: Secondary | ICD-10-CM

## 2017-12-12 DIAGNOSIS — I10 Essential (primary) hypertension: Secondary | ICD-10-CM | POA: Diagnosis not present

## 2017-12-12 DIAGNOSIS — Z79899 Other long term (current) drug therapy: Secondary | ICD-10-CM | POA: Diagnosis not present

## 2017-12-12 DIAGNOSIS — C3431 Malignant neoplasm of lower lobe, right bronchus or lung: Secondary | ICD-10-CM | POA: Diagnosis not present

## 2017-12-12 DIAGNOSIS — Z923 Personal history of irradiation: Secondary | ICD-10-CM

## 2017-12-12 DIAGNOSIS — Z5111 Encounter for antineoplastic chemotherapy: Secondary | ICD-10-CM | POA: Insufficient documentation

## 2017-12-12 DIAGNOSIS — J439 Emphysema, unspecified: Secondary | ICD-10-CM | POA: Insufficient documentation

## 2017-12-12 DIAGNOSIS — F419 Anxiety disorder, unspecified: Secondary | ICD-10-CM | POA: Insufficient documentation

## 2017-12-12 DIAGNOSIS — Z5112 Encounter for antineoplastic immunotherapy: Secondary | ICD-10-CM

## 2017-12-12 DIAGNOSIS — K5900 Constipation, unspecified: Secondary | ICD-10-CM | POA: Insufficient documentation

## 2017-12-12 DIAGNOSIS — F329 Major depressive disorder, single episode, unspecified: Secondary | ICD-10-CM | POA: Insufficient documentation

## 2017-12-12 DIAGNOSIS — J90 Pleural effusion, not elsewhere classified: Secondary | ICD-10-CM | POA: Insufficient documentation

## 2017-12-12 DIAGNOSIS — F1010 Alcohol abuse, uncomplicated: Secondary | ICD-10-CM | POA: Diagnosis not present

## 2017-12-12 DIAGNOSIS — F17210 Nicotine dependence, cigarettes, uncomplicated: Secondary | ICD-10-CM | POA: Insufficient documentation

## 2017-12-12 DIAGNOSIS — J45909 Unspecified asthma, uncomplicated: Secondary | ICD-10-CM | POA: Insufficient documentation

## 2017-12-12 DIAGNOSIS — Z7901 Long term (current) use of anticoagulants: Secondary | ICD-10-CM | POA: Diagnosis not present

## 2017-12-12 DIAGNOSIS — M1990 Unspecified osteoarthritis, unspecified site: Secondary | ICD-10-CM | POA: Diagnosis not present

## 2017-12-12 DIAGNOSIS — K219 Gastro-esophageal reflux disease without esophagitis: Secondary | ICD-10-CM | POA: Insufficient documentation

## 2017-12-12 DIAGNOSIS — F1210 Cannabis abuse, uncomplicated: Secondary | ICD-10-CM | POA: Diagnosis not present

## 2017-12-12 LAB — COMPREHENSIVE METABOLIC PANEL
ALT: 32 U/L (ref 17–63)
ANION GAP: 9 (ref 5–15)
AST: 39 U/L (ref 15–41)
Albumin: 3.3 g/dL — ABNORMAL LOW (ref 3.5–5.0)
Alkaline Phosphatase: 167 U/L — ABNORMAL HIGH (ref 38–126)
BUN: 10 mg/dL (ref 6–20)
CHLORIDE: 100 mmol/L — AB (ref 101–111)
CO2: 31 mmol/L (ref 22–32)
Calcium: 9.2 mg/dL (ref 8.9–10.3)
Creatinine, Ser: 0.97 mg/dL (ref 0.61–1.24)
GFR calc non Af Amer: >60 mL/min (ref >60)
Glucose, Bld: 132 mg/dL — ABNORMAL HIGH (ref 65–99)
Potassium: 4.7 mmol/L (ref 3.5–5.1)
SODIUM: 140 mmol/L (ref 135–145)
Total Bilirubin: 0.4 mg/dL (ref 0.3–1.2)
Total Protein: 7.9 g/dL (ref 6.5–8.1)

## 2017-12-12 LAB — CBC WITH DIFFERENTIAL/PLATELET
BASOS PCT: 0 %
Basophils Absolute: 0 10*3/uL (ref 0.0–0.1)
EOS PCT: 1 %
Eosinophils Absolute: 0.1 10*3/uL (ref 0.0–0.7)
HCT: 45.7 % (ref 39.0–52.0)
Hemoglobin: 14.9 g/dL (ref 13.0–17.0)
LYMPHS ABS: 2 10*3/uL (ref 0.7–4.0)
Lymphocytes Relative: 24 %
MCH: 32.4 pg (ref 26.0–34.0)
MCHC: 32.6 g/dL (ref 30.0–36.0)
MCV: 99.3 fL (ref 78.0–100.0)
Monocytes Absolute: 0.8 10*3/uL (ref 0.1–1.0)
Monocytes Relative: 9 %
Neutro Abs: 5.3 10*3/uL (ref 1.7–7.7)
Neutrophils Relative %: 66 %
Platelets: 143 10*3/uL — ABNORMAL LOW (ref 150–400)
RBC: 4.6 MIL/uL (ref 4.22–5.81)
RDW: 14.7 % (ref 11.5–15.5)
WBC: 8.2 10*3/uL (ref 4.0–10.5)

## 2017-12-12 LAB — TSH: TSH: 1.044 u[IU]/mL (ref 0.350–4.500)

## 2017-12-12 MED ORDER — HEPARIN SOD (PORK) LOCK FLUSH 100 UNIT/ML IV SOLN
500.0000 [IU] | Freq: Once | INTRAVENOUS | Status: AC | PRN
  Administered 2017-12-12: 500 [IU]
  Filled 2017-12-12: qty 5

## 2017-12-12 MED ORDER — LACTULOSE 10 GM/15ML PO SOLN: 10.0000 g | Freq: Three times a day (TID) | ORAL | 0 refills | Status: DC | PRN

## 2017-12-12 MED ORDER — SODIUM CHLORIDE 0.9 % IV SOLN
Freq: Once | INTRAVENOUS | Status: AC
  Administered 2017-12-12: 12:00:00 via INTRAVENOUS

## 2017-12-12 MED ORDER — SODIUM CHLORIDE 0.9 % IV SOLN
1200.0000 mg | Freq: Once | INTRAVENOUS | Status: AC
  Administered 2017-12-12: 1200 mg via INTRAVENOUS
  Filled 2017-12-12: qty 20

## 2017-12-12 NOTE — Progress Notes (Signed)
Tolerated infusion w/o adverse reaction.  Alert, in no distress.  VSS.  Discharged ambulatory.  

## 2017-12-22 NOTE — Progress Notes (Signed)
[facility] Follow-up Visit:  Assessment: Adenocarcinoma of right lung ([place]) 57 y.o. undergoing palliative chemotherapy with Tecentriq.  Male with diagnosis of stage IV adenocarcinoma of the right lung in interim, progressive symptoms of shortness of breath, fatigue, loss of appetite.  This certainly could be attributable to his current therapy although his oxygenation level has not decreased significantly to suggest significant pneumonitis from immunotherapy.  Progression of the disease is also possible.  Overall, patient is suitable to proceed with the next treatment of Tecentriq.  Plan: -- Lactulose 10 g every 8 hours as needed for constipation -- Proceed with the next cycle of atezolizumab (Tecentriq) therapy. --disease assessment with CT of the chest/abdomen/pelvis prior to the next visit in the clinic --Return to clinic in 3 weeks: Labs, clinic visit, possible continuation of atezolizumab [employer]).  Carefully monitor progressive shortness of breath for possible development of PD 1-mediated pneumonitis versus COPD exacerbation.  Currently presenting fatigue may be attributable due to developing endocrine dysfunction such as hypothyroidism or adrenal insufficiency of which I do not have evidence based on the lab work from today.  Voice recognition software was used and creation of this note. Despite my best effort at editing the text, some misspelling/errors may have occurred.  Orders Placed This Encounter  Procedures  . CBC with Differential (Cancer Center Only)    Standing Status:   Future    Standing Expiration Date:   12/12/2018  . CMP ([facility] only)    Standing Status:   Future    Standing Expiration Date:   12/12/2018  . Magnesium    Standing Status:   Future    Standing Expiration Date:   12/12/2018  . ACTH    Standing Status:   Future    Standing Expiration Date:   12/12/2018  . Cortisol    Standing Status:   Future    Standing Expiration Date:    12/12/2018    Cancer Staging Adenocarcinoma of right lung [facility]) Staging form: Lung, AJCC 7th Edition - Clinical stage from 07/23/2016: Stage IIIA (T2b, N2, M0) - Signed by Baird Cancer, PA-C on 07/23/2016 - Clinical stage from 07/28/2017: Stage IV (M1a) - Signed by Holley Bouche, NP on 07/28/2017   All questions were answered.  . The patient knows to call the clinic with any problems, questions or concerns.  This note was electronically signed.    History of Presenting Illness Erik [name] 57 y.o. presenting to the [facility] for continued clinical monitoring while receiving palliative systemic immunotherapy with Gildardo Pounds which was started in August 2018 for diagnosis of adenocarcinoma of the right lung, stage IV (T3 N2 M1a).  At the present time, patient reports gradual decrease in appetite, poor energy, shortness of breath that appears to be progressing, mild wheezing.  Also complains of significant constipation, no diarrhea or abdominal pain.  Oncological/hematological History:   Adenocarcinoma of right lung ([place])   07/01/2016 PET scan    6.3 x 3.7 cm right lower lobe mass, suspicious for primary bronchogenic neoplasm, as described above. Hypermetabolic thoracic nodal metastases, as above. Additional right perihilar hypermetabolism, indeterminate. Associated right middle lobe atelectasis/collapse.      07/19/2016 Procedure    Bronchoscopy with brushings and biopsies and endobronchial ultrasound with mediastinal lymph node aspirations by Dr. Roxan Hockey      07/21/2016 Pathology Results    Lung, biopsy, Right Middle Lobe - LUNG TISSUE WITH SQUAMOUS METAPLASIA. - NO MALIGNANCY IDENTIFIED.      07/21/2016 Pathology  Results    FINE NEEDLE ASPIRATION, ENDOSCOPIC (A) LEVEL 7 (SPECIMEN 1 OF 3, COLLECTED ON 07/19/16): MALIGNANT CELLS CONSISTENT WITH ADENOCARCINOMA.      07/21/2016 Pathology Results    FINE NEEDLE ASPIRATION, EBUS, 4R, B (SPECIMEN 2 OF 3, COLLECTED  07/19/16): MALIGNANT CELLS CONSISTENT WITH ADENOCARCINOMA.      07/21/2016 Pathology Results    FINE NEEDLE ASPIRATION, EBUS, BRUSHING, RIGHT MIDDLE LOBE, D (SPECIMEN 3 OF 3, COLLECTED 07/19/16): MALIGNANT CELLS CONSISTENT WITH ADENOCARCINOMA.      07/22/2016 Imaging    MRI brain- No evidence of intracranial metastases.      08/17/2016 - 09/20/2016 Chemotherapy    The patient had palonosetron (ALOXI) injection 0.25 mg, 0.25 mg, Intravenous,  Once, 1 of 1 cycle  CISplatin (PLATINOL) 123 mg in sodium chloride 0.9 % 500 mL chemo infusion, 50 mg/m2 = 123 mg, Intravenous,  Once, 1 of 1 cycle  etoposide (VEPESID) 120 mg in sodium chloride 0.9 % 500 mL chemo infusion, 50 mg/m2 = 120 mg, Intravenous,  Once, 1 of 1 cycle  fosaprepitant (EMEND) 150 mg, dexamethasone (DECADRON) 12 mg in sodium chloride 0.9 % 145 mL IVPB, , Intravenous,  Once, 1 of 1 cycle  ondansetron (ZOFRAN) 8 mg in sodium chloride 0.9 % 50 mL IVPB, , Intravenous,  Once, 2 of 6 cycles  for chemotherapy treatment.        08/17/2016 -  Radiation Therapy         11/15/2016 Imaging    CT CAP- Interval response to therapy. The previously demonstrated mediastinal and right hilar adenopathy and resulting right middle lobe atelectasis have all improved. 2. No discrete residual lung masses are identified. There is new multifocal ground-glass opacity within the right lower lobe, and to a lesser extent in the left upper lobe. These are probably inflammatory/treatment related. 3. No evidence of abdominopelvic metastatic disease. Stable prominent lymph nodes in the upper abdomen, likely reactive. 4. Decompressed mid SVC without specific signs of SVC occlusion.      12/02/2016 -  Chemotherapy    Imfinzi (durvalumab) immunotherapy every 2 weeks x up to 1 year      01/03/2017 Procedure    EGD by Dr. [name], colonoscopy aborted as "patient forgot to take other half of preparation). Esophagitis. likely radiation-induced ?Dilated.   Erythematous mucosa in the stomach. Biopsied. Normal duodenal bulb and second portion of the duodenum.      02/14/2017 Imaging    CT chest- 1. Development of a small to moderate right-sided pleural effusion with minimal loculation anteriorly. 2. Worsened right-sided aeration with right middle and lower lobe consolidation. As this has a geographic distribution, this could be radiation induced or represent infection. Depending on clinical concern of progressive disease, thoracentesis and/or PET may be informative. 3. Development of mild right paratracheal adenopathy, most likely reactive. Recommend attention on follow-up. 4. Subtle findings which are highly suspicious for mild cirrhosis. Upper abdominal adenopathy is similar and likely reactive. 5. Persistent right lower and improved left upper lobe ground-glass opacities are favored to be infectious or inflammatory.      06/01/2017 Imaging    CT chest  IMPRESSION: 1. Evolving radiation changes in the medial right hemithorax. 2. Stable moderate size right pleural effusion without definite nodular components. 3. No signs of local recurrence or progressive metastatic disease. No residual enlarged thoracic lymph nodes. 4. Stable appearance of the visualized upper abdomen with probable cirrhosis and reactive adenopathy in the porta hepatis.      07/15/2017 Procedure  Therapeutic thoracentesis performed today; 1.3 L removed.  Fluid sent for cytology.       07/15/2017 Pathology Results    Pleural fluid NEGATIVE for malignancy by cytology.       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 Paramediastinal radiation change and loculated right pleural effusion appear unchanged from 07/14/2017. 2. Borderline prevascular lymph node within the anterior mediastinum is mildly increased in size from previous exam. 3. There is a new 5 mm lung nodule  within the left lower lobe. Nonspecific in appearance. Attention on follow-up imaging advise. Other small nonspecific nodules in the left lung are stable. 4. Morphologic features of the liver compatible with cirrhosis. Enlarged upper abdominal lymph nodes are nonspecific and likely reactive. 5. Stable indeterminate low-attenuation focus in the posterior right liver dome. Previously characterized on MRI from 07/29/2017 as reflecting post treatment changes from external beam radiation to the lung. 6.  Emphysema (ICD10-J43.9).       Medical History: Past Medical History:  Diagnosis Date  . Arnold-Chiari syndrome ([place])   . Arthritis   . Asthma   . Chronic back pain   . Depression   . Dyspnea    with exertion   . GERD (gastroesophageal reflux disease)   . Hypertension   . Seasonal allergies   . Spinal stenosis of lumbar region   . 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 Wheezing     Surgical History: Past Surgical History:  Procedure Laterality Date  . BACK SURGERY     5 total  . BIOPSY  01/03/2017   Procedure: BIOPSY;  Surgeon: Daneil Dolin, MD;  [facility]: [facility];  Service: Endoscopy;;  gastric  . COLONOSCOPY WITH PROPOFOL N/A 04/04/2017   Procedure: COLONOSCOPY WITH PROPOFOL;  Surgeon: Daneil Dolin, MD;  [facility];  Service: Endoscopy;  Laterality: N/A;  8:45am  . ESOPHAGOGASTRODUODENOSCOPY (EGD) WITH PROPOFOL N/A 01/03/2017   Procedure: ESOPHAGOGASTRODUODENOSCOPY (EGD) WITH PROPOFOL;  Surgeon: Daneil Dolin, MD;  [facility]: [facility];  Service: Endoscopy;  Laterality: N/A;  . Venia Minks DILATION N/A 01/03/2017   Procedure: Venia Minks DILATION;  Surgeon: Daneil Dolin, MD;  [facility];  Service: Endoscopy;  Laterality: N/A;  . MULTIPLE EXTRACTIONS WITH ALVEOLOPLASTY N/A 07/08/2014   Procedure: MULTIPLE EXTRACION WITH ALVEOLOPLASTY with EXCISION LESION RIGHT SIDE OF TONGUE;  Surgeon: Gae Bon, DDS;  Location: [facility];   Service: Oral Surgery;  Laterality: N/A;  . PORTACATH PLACEMENT Right 07/30/2016   Procedure: INSERTION PORT-A-CATH;  Surgeon: Vickie Epley, MD;  Location: [facility];  Service: Vascular;  Laterality: Right;  [name] VIDEO BRONCHOSCOPY WITH ENDOBRONCHIAL ULTRASOUND N/A 07/19/2016   Procedure: VIDEO BRONCHOSCOPY WITH ENDOBRONCHIAL ULTRASOUND;  Surgeon: [name], MD;  Location: [facility] OR;  Service: Thoracic;  Laterality: N/A;    Family History: Family History  Problem Relation Age of Onset  . Cancer Mother        breast cancer  . Heart failure Brother   . Colon cancer Neg Hx        not sure, ?grandfather and/or uncle    Social History: Social History   Socioeconomic History  . Marital status: Divorced    Spouse name: Not on file  . Number of children: Not on file  . Years of education: Not on file  . Highest education level: Not on file  Social Needs  . Financial resource strain: Not on file  . Food insecurity - worry: Not on file  . Food insecurity - inability: Not on file  . Transportation needs - medical: Not on file  . Transportation needs - non-medical: Not on file  Occupational History  . Not on file  Tobacco Use  . Smoking status: Current Every Day Smoker    Packs/day: 0.25    Years: 38.00    Pack years: 9.50    Types: Cigarettes  . Smokeless tobacco: Former Systems developer    Types: Chew  . Tobacco comment: using chantix- smoking once in a while  Substance and Sexual Activity  . Alcohol use: Yes    Alcohol/week: 1.2 oz    Types: 2 Cans of beer per week    Comment: reports drinking 0-2 beers a week  . Drug use: Yes    Frequency: 7.0 times per week    Types: Marijuana    Comment: daily for cancer; pt denies any use at present time 08/23/17  . Sexual activity: Not Currently  Other Topics Concern  . Not on file  Social History Narrative  . Not on file    Allergies: Allergies  Allergen Reactions  . Demeclocycline Other (See Comments) and Swelling  . Tetracyclines &  Related Swelling and Other (See Comments)         Medications:  Current Outpatient Medications  Medication Sig Dispense Refill  . albuterol (PROVENTIL HFA;VENTOLIN HFA) 108 (90 BASE) MCG/ACT inhaler Inhale 2 puffs into the lungs every 6 (six) hours as needed for wheezing.     [name] ALPRAZolam (XANAX) 1 MG tablet Take 1 mg by mouth 2 (two) times daily as needed for anxiety or sleep (takes 1 tablet and bedtime for sleep and 1 dose during the day only if needed).     [name] amLODipine (NORVASC) 5 MG tablet Take 5 mg by mouth daily.     . Artificial Tear Solution (SOOTHE XP) SOLN Apply 2 drops to eye daily.    Huey Bienenstock (TECENTRIQ IV) Inject into the vein. Every 3 weeks    . budesonide-formoterol (SYMBICORT) 160-4.5 MCG/ACT inhaler Inhale 2 puffs into the lungs daily.     [name] escitalopram (LEXAPRO) 20 MG tablet Take 20 mg by mouth daily.     [name] gabapentin (NEURONTIN) 600 MG tablet Take 600 mg 3 (three) times daily by mouth.     [name] guaiFENesin (MUCINEX) 600 MG 12 hr tablet Take 600 mg by mouth 2 (two) times daily as needed (for congestion.).    [name] hydrochlorothiazide (MICROZIDE) 12.5 MG capsule     . lidocaine (XYLOCAINE) 2 % solution Use as directed 20 mLs in the mouth or throat every 6 (six) hours as needed (for mouth pain (used prior to eating when needed)).    [name] lidocaine-prilocaine (EMLA) cream Apply a quarter size amount to port site 1 hour prior to chemo. Do not rub in. Cover with plastic wrap. 30 g 2  . methadone (DOLOPHINE) 10 MG tablet Take 20 mg by mouth 5 (five) times daily. Takes 2 tablets 5 times daily    . mirtazapine (REMERON) 15 MG tablet Take 1 tablet by mouth at bedtime.    . Multiple Vitamin (MULTIVITAMIN WITH MINERALS) TABS tablet Take 1 tablet by mouth daily. Centrum    . Na Sulfate-K Sulfate-Mg Sulf (SUPREP BOWEL PREP KIT) 17.5-3.13-1.6 GM/180ML SOLN Take 1 kit by mouth as directed. 1 Bottle 0  . ondansetron (ZOFRAN) 8 MG tablet TAKE 1  TABLET BY MOUTH EVERY 8 HOURS AS NEEDED FOR  NAUSEA AND VOMITING. 30 tablet 2  . pantoprazole (PROTONIX) 40 MG tablet TAKE (1) TABLET BY MOUTH TWICE DAILY. 60 tablet 3  . prochlorperazine (COMPAZINE) 10 MG tablet TAKE 1 TABLET EVERY 6 HOURS AS NEEDED FOR NAUSEA AND VOMITING. 60 tablet 3  . senna-docusate (SENNA S) 8.6-50 MG tablet Take 1 tablet by mouth 2 (two) times daily. 60 tablet 1  . tiZANidine (ZANAFLEX) 4 MG tablet Take 4 mg by mouth every 8 (eight) hours as needed for muscle spasms.     . varenicline (CHANTIX) 0.5 MG tablet Initially take 1 tab for days 1-3 then 1 tab PO twice daily for days 4-7, then 2 tabs twice daily for a total of 12 weeks. 190 tablet 0  . lactulose (CHRONULAC) 10 GM/15ML solution Take 15 mLs (10 g total) by mouth 3 (three) times daily as needed for mild constipation or moderate constipation. 240 mL 0   No current facility-administered medications for this visit.    Facility-Administered Medications Ordered in Other Visits  Medication Dose Route Frequency Provider Last Rate Last Dose  . 0.9 %  sodium chloride infusion   Intravenous Continuous Baird Cancer, PA-C 10 mL/hr at 01/13/17 1330      Review of Systems: Review of Systems  Constitutional: Positive for fatigue.  HENT:   Positive for trouble swallowing.   Respiratory: Positive for shortness of breath.   Cardiovascular: Positive for leg swelling.  Gastrointestinal: Positive for constipation and nausea.  Musculoskeletal: Positive for back pain and myalgias.  Neurological: Positive for dizziness and numbness.  Hematological: Bruises/bleeds easily.  All other systems reviewed and are negative.    PHYSICAL EXAMINATION Blood pressure 109/75, pulse (!) 107, temperature 98.5 F (36.9 C), temperature source Oral, resp. rate 20, height 6' (1.829 m), weight 222 lb 8 oz (100.9 kg), SpO2 95 %.  ECOG PERFORMANCE STATUS: 2 - Symptomatic, <50% confined to bed  Physical Exam  Constitutional: He is oriented to person, place, and time and well-developed,  well-nourished, and in no distress. No distress.  HENT:  Head: Normocephalic and atraumatic.  Mouth/Throat: Oropharynx is clear and moist. No oropharyngeal exudate.  Eyes: Conjunctivae and EOM are normal. Pupils are equal, round, and reactive to light.  Neck: No thyromegaly present.  Cardiovascular: Normal rate, regular rhythm and normal heart sounds.  No murmur heard. Pulmonary/Chest: Effort normal. No respiratory distress. He has wheezes. He has no rales.  Abdominal: Soft. Bowel sounds are normal. He exhibits no distension and no mass. There is no tenderness. There is no rebound.  Musculoskeletal: He exhibits no edema.  Lymphadenopathy:    He has no cervical adenopathy.  Neurological: He is alert and oriented to person, place, and time. He has normal reflexes. No cranial nerve deficit.  Skin: Skin is warm and dry. No rash noted. He is not diaphoretic. No erythema.     LABORATORY DATA: I have personally reviewed the data as listed: Appointment on 12/12/2017  Component Date Value Ref Range Status  . WBC 12/12/2017 8.2  4.0 - 10.5 K/uL Final  . RBC 12/12/2017 4.60  4.22 - 5.81 MIL/uL Final  . Hemoglobin 12/12/2017 14.9  13.0 - 17.0 g/dL Final  . HCT 12/12/2017 45.7  39.0 - 52.0 % Final  . MCV 12/12/2017 99.3  78.0 - 100.0 fL Final  . MCH 12/12/2017 32.4  26.0 - 34.0 pg Final  . MCHC 12/12/2017 32.6  30.0 - 36.0 g/dL Final  . RDW  12/12/2017 14.7  11.5 - 15.5 % Final  . Platelets 12/12/2017 143* 150 - 400 K/uL Final  . Neutrophils Relative % 12/12/2017 66  % Final  . Neutro Abs 12/12/2017 5.3  1.7 - 7.7 K/uL Final  . Lymphocytes Relative 12/12/2017 24  % Final  . Lymphs Abs 12/12/2017 2.0  0.7 - 4.0 K/uL Final  . Monocytes Relative 12/12/2017 9  % Final  . Monocytes Absolute 12/12/2017 0.8  0.1 - 1.0 K/uL Final  . Eosinophils Relative 12/12/2017 1  % Final  . Eosinophils Absolute 12/12/2017 0.1  0.0 - 0.7 K/uL Final  . Basophils Relative 12/12/2017 0  % Final  . Basophils Absolute  12/12/2017 0.0  0.0 - 0.1 K/uL Final  . Sodium 12/12/2017 140  135 - 145 mmol/L Final  . Potassium 12/12/2017 4.7  3.5 - 5.1 mmol/L Final  . Chloride 12/12/2017 100* 101 - 111 mmol/L Final  . CO2 12/12/2017 31  22 - 32 mmol/L Final  . Glucose, Bld 12/12/2017 132* 65 - 99 mg/dL Final  . BUN 12/12/2017 10  6 - 20 mg/dL Final  . Creatinine, Ser 12/12/2017 0.97  0.61 - 1.24 mg/dL Final  . Calcium 12/12/2017 9.2  8.9 - 10.3 mg/dL Final  . Total Protein 12/12/2017 7.9  6.5 - 8.1 g/dL Final  . Albumin 12/12/2017 3.3* 3.5 - 5.0 g/dL Final  . AST 12/12/2017 39  15 - 41 U/L Final  . ALT 12/12/2017 32  17 - 63 U/L Final  . Alkaline Phosphatase 12/12/2017 167* 38 - 126 U/L Final  . Total Bilirubin 12/12/2017 0.4  0.3 - 1.2 mg/dL Final  . GFR calc non Af Amer 12/12/2017 >60  >60 mL/min Final  . GFR calc Af Amer 12/12/2017 >60  >60 mL/min Final   Comment: (NOTE) The eGFR has been calculated using the CKD EPI equation. This calculation has not been validated in all clinical situations. eGFR's persistently <60 mL/min signify possible Chronic Kidney Disease.   . Anion gap 12/12/2017 9  5 - 15 Final  . TSH 12/12/2017 1.044  0.350 - 4.500 uIU/mL Final   Performed by a 3rd Generation assay with a functional sensitivity of <=0.01 uIU/mL.       Ardath Sax, MD

## 2017-12-22 NOTE — Assessment & Plan Note (Signed)
57 y.o. undergoing palliative chemotherapy with Tecentriq.  Male with diagnosis of stage IV adenocarcinoma of the right lung in interim, progressive symptoms of shortness of breath, fatigue, loss of appetite.  This certainly could be attributable to his current therapy although his oxygenation level has not decreased significantly to suggest significant pneumonitis from immunotherapy.  Progression of the disease is also possible.  Overall, patient is suitable to proceed with the next treatment of Tecentriq.  Plan: -- Lactulose 10 g every 8 hours as needed for constipation -- Proceed with the next cycle of atezolizumab (Tecentriq) therapy. --disease assessment with CT of the chest/abdomen/pelvis prior to the next visit in the clinic --Return to clinic in 3 weeks: Labs, clinic visit, possible continuation of atezolizumab [employer]).  Carefully monitor progressive shortness of breath for possible development of PD 1-mediated pneumonitis versus COPD exacerbation.  Currently presenting fatigue may be attributable due to developing endocrine dysfunction such as hypothyroidism or adrenal insufficiency of which I do not have evidence based on the lab work from today.

## 2017-12-26 ENCOUNTER — Encounter (HOSPITAL_COMMUNITY): Payer: Self-pay | Admitting: Oncology

## 2017-12-26 ENCOUNTER — Ambulatory Visit (HOSPITAL_COMMUNITY)
Admission: RE | Admit: 2017-12-26 | Discharge: 2017-12-26 | Disposition: A | Discharge status: Home / Self Care | Payer: Medicare Other | Source: Ambulatory Visit | Attending: Oncology | Admitting: Oncology

## 2017-12-26 ENCOUNTER — Other Ambulatory Visit: Payer: Self-pay

## 2017-12-26 ENCOUNTER — Inpatient Hospital Stay (HOSPITAL_BASED_OUTPATIENT_CLINIC_OR_DEPARTMENT_OTHER): Payer: Medicare Other | Admitting: Oncology

## 2017-12-26 ENCOUNTER — Other Ambulatory Visit (HOSPITAL_COMMUNITY): Payer: Self-pay | Admitting: Emergency Medicine

## 2017-12-26 VITALS — BP 99/58 | HR 112 | Temp 98.5°F | Resp 20 | Wt 212.4 lb

## 2017-12-26 DIAGNOSIS — R0602 Shortness of breath: Secondary | ICD-10-CM | POA: Insufficient documentation

## 2017-12-26 DIAGNOSIS — Z5111 Encounter for antineoplastic chemotherapy: Secondary | ICD-10-CM | POA: Diagnosis not present

## 2017-12-26 DIAGNOSIS — R933 Abnormal findings on diagnostic imaging of other parts of digestive tract: Secondary | ICD-10-CM | POA: Insufficient documentation

## 2017-12-26 DIAGNOSIS — R059 Cough, unspecified: Secondary | ICD-10-CM

## 2017-12-26 DIAGNOSIS — R05 Cough: Secondary | ICD-10-CM

## 2017-12-26 DIAGNOSIS — R918 Other nonspecific abnormal finding of lung field: Secondary | ICD-10-CM | POA: Diagnosis not present

## 2017-12-26 DIAGNOSIS — C3490 Malignant neoplasm of unspecified part of unspecified bronchus or lung: Secondary | ICD-10-CM | POA: Insufficient documentation

## 2017-12-26 DIAGNOSIS — I313 Pericardial effusion (noninflammatory): Secondary | ICD-10-CM | POA: Diagnosis not present

## 2017-12-26 DIAGNOSIS — Z95828 Presence of other vascular implants and grafts: Secondary | ICD-10-CM

## 2017-12-26 DIAGNOSIS — I2699 Other pulmonary embolism without acute cor pulmonale: Secondary | ICD-10-CM | POA: Diagnosis not present

## 2017-12-26 DIAGNOSIS — J90 Pleural effusion, not elsewhere classified: Secondary | ICD-10-CM | POA: Insufficient documentation

## 2017-12-26 DIAGNOSIS — I700 Atherosclerosis of aorta: Secondary | ICD-10-CM | POA: Insufficient documentation

## 2017-12-26 DIAGNOSIS — J439 Emphysema, unspecified: Secondary | ICD-10-CM | POA: Insufficient documentation

## 2017-12-26 DIAGNOSIS — R590 Localized enlarged lymph nodes: Secondary | ICD-10-CM | POA: Diagnosis not present

## 2017-12-26 DIAGNOSIS — Z923 Personal history of irradiation: Secondary | ICD-10-CM | POA: Insufficient documentation

## 2017-12-26 LAB — CBC WITH DIFFERENTIAL/PLATELET
BASOS PCT: 0 %
Basophils Absolute: 0 10*3/uL (ref 0.0–0.1)
EOS ABS: 0 10*3/uL (ref 0.0–0.7)
Eosinophils Relative: 1 %
HCT: 44.1 % (ref 39.0–52.0)
Hemoglobin: 14.9 g/dL (ref 13.0–17.0)
LYMPHS ABS: 1.8 10*3/uL (ref 0.7–4.0)
Lymphocytes Relative: 22 %
MCH: 31.9 pg (ref 26.0–34.0)
MCHC: 33.8 g/dL (ref 30.0–36.0)
MCV: 94.4 fL (ref 78.0–100.0)
MONO ABS: 0.8 10*3/uL (ref 0.1–1.0)
MONOS PCT: 10 %
Neutro Abs: 5.4 10*3/uL (ref 1.7–7.7)
Neutrophils Relative %: 67 %
Platelets: 166 10*3/uL (ref 150–400)
RBC: 4.67 MIL/uL (ref 4.22–5.81)
RDW: 14.2 % (ref 11.5–15.5)
WBC: 8 10*3/uL (ref 4.0–10.5)

## 2017-12-26 LAB — COMPREHENSIVE METABOLIC PANEL
ALBUMIN: 3.1 g/dL — AB (ref 3.5–5.0)
ALK PHOS: 156 U/L — AB (ref 38–126)
ALT: 37 U/L (ref 17–63)
AST: 58 U/L — AB (ref 15–41)
Anion gap: 13 (ref 5–15)
BUN: 10 mg/dL (ref 6–20)
CALCIUM: 8.8 mg/dL — AB (ref 8.9–10.3)
CO2: 28 mmol/L (ref 22–32)
CREATININE: 0.7 mg/dL (ref 0.61–1.24)
Chloride: 94 mmol/L — ABNORMAL LOW (ref 101–111)
GFR calc Af Amer: >60 mL/min (ref >60)
GFR calc non Af Amer: >60 mL/min (ref >60)
GLUCOSE: 119 mg/dL — AB (ref 65–99)
Potassium: 3.4 mmol/L — ABNORMAL LOW (ref 3.5–5.1)
SODIUM: 135 mmol/L (ref 135–145)
Total Bilirubin: 1.2 mg/dL (ref 0.3–1.2)
Total Protein: 8.1 g/dL (ref 6.5–8.1)

## 2017-12-26 LAB — D-DIMER, QUANTITATIVE: D-Dimer, Quant: 3.16 ug/mL-FEU — ABNORMAL HIGH (ref 0.00–0.50)

## 2017-12-26 MED ORDER — HEPARIN SOD (PORK) LOCK FLUSH 100 UNIT/ML IV SOLN
500.0000 [IU] | Freq: Once | INTRAVENOUS | Status: AC
  Administered 2017-12-26: 500 [IU] via INTRAVENOUS
  Filled 2017-12-26: qty 5

## 2017-12-26 MED ORDER — PREDNISONE 10 MG (21) PO TBPK: ORAL_TABLET | ORAL | 0 refills | Status: DC

## 2017-12-26 MED ORDER — IOPAMIDOL (ISOVUE-370) INJECTION 76%
100.0000 mL | Freq: Once | INTRAVENOUS | Status: AC | PRN
  Administered 2017-12-26: 100 mL via INTRAVENOUS

## 2017-12-26 MED ORDER — ELIQUIS 5 MG VTE STARTER PACK: ORAL_TABLET | ORAL | 0 refills | Status: DC

## 2017-12-26 MED ORDER — ALBUTEROL SULFATE HFA 108 (90 BASE) MCG/ACT IN AERS: 2.0000 | INHALATION_SPRAY | Freq: Four times a day (QID) | RESPIRATORY_TRACT | 0 refills | Status: DC | PRN

## 2017-12-26 MED ORDER — LEVOFLOXACIN 500 MG PO TABS: 500.0000 mg | ORAL_TABLET | Freq: Every day | ORAL | 0 refills | Status: DC

## 2017-12-26 MED ORDER — IOPAMIDOL (ISOVUE-300) INJECTION 61%: 100.0000 mL | Freq: Once | INTRAVENOUS | Status: DC | PRN

## 2017-12-26 NOTE — Progress Notes (Signed)
Pt called and stated that he is SOB and having to use his inhaler a lot more than normal.  No fevers. He thinks he may have pneumonia.  He really does not want to have to go to the ER.  Spoke with Rulon Abide NP and she orders chest x-ray stat and he is to come up to the waiting room and wait for the results.  He verbalized understanding.  RN explained he still needs to get his CT scans this week.

## 2017-12-26 NOTE — Progress Notes (Signed)
Symptom Management Consult note [place]  [phone] [phone]  Patient Care Team: [name], MD as PCP - General (Internal Medicine) [name] [name], MD as Consulting Physician (Gastroenterology)   Name of the patient: Erik [name]  [id number]  07-03-1961   Date of visit: 12/27/17  Diagnosis- stage IV adenocarcinoma of the right lung  Chief complaint/ Reason for visit- Shortness of breath   Heme/Onc history: Erik [name] 57 y.o. presenting to the [facility] for continued clinical monitoring while receiving palliative systemic immunotherapy with Gildardo Pounds which was started in August 2018 for diagnosis of adenocarcinoma of the right lung, stage IV (T3 N2 M1a).    Interval history- Patient was last evaluated by Dr. Lebron Conners on 12/12/2017 where he reported a gradual decrease in appetite, poor energy, shortness of breath that was progressing and mild wheezing.  He also complained of significant constipation without diarrhea or abdominal pain.  Patient was given lactulose 10 g every 8 hours for constipation and proceeded with next cycle of Tecentriq.  He was to return with CT of chest/abdomen/pelvis prior to his next visit to clinic for restaging.  There was worry for possible pneumonitis versus COPD exacerbation given his progressive shortness of breath.  There is also a worry for possible endocrine dysfunction given his presentation  of worsening fatigue but was not evident  based on current lab work.  Patient presented today for worsening shortness of breath, weakness,  intermittent chest pain and cough.  Patient called nurse navigator this morning with symptoms indicating "I feel horrible but will not go to the emergency room.  What can be done for me to stay out of the emergency room?".  Patient was advised to be seen in the emergency room by NP but refused.  Patient agreed to come into clinic to be evaluated by NP.  Chest x-ray ordered prior to  evaluation given his shortness of breath and cough.  Chest x-ray showed moderate right pleural effusion with consolidation of right lung base that was unchanged compared to prior exam.  Patient was placed in exam room for evaluation by NP.  Patient states his symptoms began approximately 2-3 days ago but have become progressively worse.  He states "I feel horrible".  Symptoms include cough, congestion, shortness of breath, fatigue, weakness, occasional chest pain and several episodes of hemoptysis.  Unfortunately patient was not forthcoming with symptoms given to nurse navigator when he called. He denies fevers or chills.  He states he has no appetite and thinks he has lost weight.   Patient takes care of his mom at home and is trying to remain out of the hospital so that he can care for her.  He also has a dog that he needs to care for.   ECOG FS:1 - Symptomatic but completely ambulatory  Review of systems- Review of Systems  Constitutional: Positive for malaise/fatigue and weight loss. Negative for chills and fever.  HENT: Negative.   Eyes: Negative.   Respiratory: Positive for cough, hemoptysis, sputum production and shortness of breath.   Cardiovascular: Positive for chest pain. Negative for palpitations, orthopnea, claudication and leg swelling.  Gastrointestinal: Negative for abdominal pain, blood in stool, constipation, diarrhea, nausea and vomiting.  Genitourinary: Negative.   Musculoskeletal: Negative.   Skin: Negative.   Neurological: Positive for weakness. Negative for dizziness, tingling, tremors and headaches.  Endo/Heme/Allergies: Negative.   Psychiatric/Behavioral: Negative.      Current treatment- Tecntriq- Last on 12/12/17  Allergies  Allergen Reactions  . Demeclocycline Other (See Comments) and Swelling  . Tetracyclines & Related Swelling and Other (See Comments)          Past Medical History:  Diagnosis Date  . Arnold-Chiari syndrome ([place])   . Arthritis   .  Asthma   . Chronic back pain   . Depression   . Dyspnea    with exertion   . GERD (gastroesophageal reflux disease)   . Hypertension   . Seasonal allergies   . Spinal stenosis of lumbar region   . 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 Wheezing      Past Surgical History:  Procedure Laterality Date  . BACK SURGERY     5 total  . BIOPSY  01/03/2017   Procedure: BIOPSY;  Surgeon: Daneil Dolin, MD;  [facility];  Service: Endoscopy;;  gastric  . COLONOSCOPY WITH PROPOFOL N/A 04/04/2017   Procedure: COLONOSCOPY WITH PROPOFOL;  Surgeon: Daneil Dolin, MD;  [facility];  Service: Endoscopy;  Laterality: N/A;  8:45am  . ESOPHAGOGASTRODUODENOSCOPY (EGD) WITH PROPOFOL N/A 01/03/2017   Procedure: ESOPHAGOGASTRODUODENOSCOPY (EGD) WITH PROPOFOL;  Surgeon: Daneil Dolin, MD;  [facility];  Service: Endoscopy;  Laterality: N/A;  . Venia Minks DILATION N/A 01/03/2017   Procedure: Venia Minks DILATION;  Surgeon: Daneil Dolin, MD;  [facility];  Service: Endoscopy;  Laterality: N/A;  . MULTIPLE EXTRACTIONS WITH ALVEOLOPLASTY N/A 07/08/2014   Procedure: MULTIPLE EXTRACION WITH ALVEOLOPLASTY with EXCISION LESION RIGHT SIDE OF TONGUE;  Surgeon: Gae Bon, DDS;  Location: [facility];  Service: Oral Surgery;  Laterality: N/A;  . PORTACATH PLACEMENT Right 07/30/2016   Procedure: INSERTION PORT-A-CATH;  Surgeon: Vickie Epley, MD;  Location: [facility];  Service: Vascular;  Laterality: Right;  [name] VIDEO BRONCHOSCOPY WITH ENDOBRONCHIAL ULTRASOUND N/A 07/19/2016   Procedure: VIDEO BRONCHOSCOPY WITH ENDOBRONCHIAL ULTRASOUND;  Surgeon: [name], MD;  Location: [facility];  Service: Thoracic;  Laterality: N/A;    Social History   Socioeconomic History  . Marital status: Divorced    Spouse name: Not on file  . Number of children: Not on file  . Years of education: Not on file  . Highest education level: Not on file  Social Needs  . Financial  resource strain: Not on file  . Food insecurity - worry: Not on file  . Food insecurity - inability: Not on file  . Transportation needs - medical: Not on file  . Transportation needs - non-medical: Not on file  Occupational History  . Not on file  Tobacco Use  . Smoking status: Current Every Day Smoker    Packs/day: 0.25    Years: 38.00    Pack years: 9.50    Types: Cigarettes  . Smokeless tobacco: Former Systems developer    Types: Chew  . Tobacco comment: using chantix- smoking once in a while  Substance and Sexual Activity  . Alcohol use: Yes    Alcohol/week: 1.2 oz    Types: 2 Cans of beer per week    Comment: reports drinking 0-2 beers a week  . Drug use: Yes    Frequency: 7.0 times per week    Types: Marijuana    Comment: daily for cancer; pt denies any use at present time 08/23/17  . Sexual activity: Not Currently  Other Topics Concern  . Not on file  Social History Narrative  . Not on file    Family History  Problem  Relation Age of Onset  . Cancer Mother        breast cancer  . Heart failure Brother   . Colon cancer Neg Hx        not sure, ?grandfather and/or uncle     Current Outpatient Medications:  .  albuterol (PROVENTIL HFA;VENTOLIN HFA) 108 (90 Base) MCG/ACT inhaler, Inhale 2 puffs into the lungs every 6 (six) hours as needed for wheezing., Disp: 1 Inhaler, Rfl: 0 .  ALPRAZolam (XANAX) 1 MG tablet, Take 1 mg by mouth 2 (two) times daily as needed for anxiety or sleep (takes 1 tablet and bedtime for sleep and 1 dose during the day only if needed). , Disp: , Rfl:  .  amLODipine (NORVASC) 5 MG tablet, Take 5 mg by mouth daily. , Disp: , Rfl:  .  Artificial Tear Solution (SOOTHE XP) SOLN, Apply 2 drops to eye daily., Disp: , Rfl:  .  Atezolizumab (TECENTRIQ IV), Inject into the vein. Every 3 weeks, Disp: , Rfl:  .  budesonide-formoterol (SYMBICORT) 160-4.5 MCG/ACT inhaler, Inhale 2 puffs into the lungs daily. , Disp: , Rfl:  .  ELIQUIS STARTER PACK (ELIQUIS STARTER PACK)  5 MG TABS, Take as directed on package: start with two-24m tablets twice daily for 7 days. On day 8, switch to one-5100mtablet twice daily., Disp: 1 each, Rfl: 0 .  escitalopram (LEXAPRO) 20 MG tablet, Take 20 mg by mouth daily. , Disp: , Rfl:  .  gabapentin (NEURONTIN) 600 MG tablet, Take 600 mg 3 (three) times daily by mouth. , Disp: , Rfl:  .  guaiFENesin (MUCINEX) 600 MG 12 hr tablet, Take 600 mg by mouth 2 (two) times daily as needed (for congestion.)., Disp: , Rfl:  .  hydrochlorothiazide (MICROZIDE) 12.5 MG capsule, , Disp: , Rfl:  .  lactulose (CHRONULAC) 10 GM/15ML solution, Take 15 mLs (10 g total) by mouth 3 (three) times daily as needed for mild constipation or moderate constipation., Disp: 240 mL, Rfl: 0 .  levofloxacin (LEVAQUIN) 500 MG tablet, Take 1 tablet (500 mg total) by mouth daily., Disp: 10 tablet, Rfl: 0 .  lidocaine (XYLOCAINE) 2 % solution, Use as directed 20 mLs in the mouth or throat every 6 (six) hours as needed (for mouth pain (used prior to eating when needed))., Disp: , Rfl:  .  lidocaine-prilocaine (EMLA) cream, Apply a quarter size amount to port site 1 hour prior to chemo. Do not rub in. Cover with plastic wrap., Disp: 30 g, Rfl: 2 .  methadone (DOLOPHINE) 10 MG tablet, Take 20 mg by mouth 5 (five) times daily. Takes 2 tablets 5 times daily, Disp: , Rfl:  .  mirtazapine (REMERON) 15 MG tablet, Take 1 tablet by mouth at bedtime., Disp: , Rfl:  .  Multiple Vitamin (MULTIVITAMIN WITH MINERALS) TABS tablet, Take 1 tablet by mouth daily. Centrum, Disp: , Rfl:  .  Na Sulfate-K Sulfate-Mg Sulf (SUPREP BOWEL PREP KIT) 17.5-3.13-1.6 GM/180ML SOLN, Take 1 kit by mouth as directed., Disp: 1 Bottle, Rfl: 0 .  ondansetron (ZOFRAN) 8 MG tablet, TAKE 1 TABLET BY MOUTH EVERY 8 HOURS AS NEEDED FOR NAUSEA AND VOMITING., Disp: 30 tablet, Rfl: 2 .  pantoprazole (PROTONIX) 40 MG tablet, TAKE (1) TABLET BY MOUTH TWICE DAILY., Disp: 60 tablet, Rfl: 3 .  predniSONE (STERAPRED UNI-PAK 21 TAB)  10 MG (21) TBPK tablet, Take as prescribed., Disp: 21 tablet, Rfl: 0 .  prochlorperazine (COMPAZINE) 10 MG tablet, TAKE 1 TABLET EVERY 6 HOURS AS NEEDED  FOR NAUSEA AND VOMITING., Disp: 60 tablet, Rfl: 3 .  senna-docusate (SENNA S) 8.6-50 MG tablet, Take 1 tablet by mouth 2 (two) times daily., Disp: 60 tablet, Rfl: 1 .  tiZANidine (ZANAFLEX) 4 MG tablet, Take 4 mg by mouth every 8 (eight) hours as needed for muscle spasms. , Disp: , Rfl:  .  varenicline (CHANTIX) 0.5 MG tablet, Initially take 1 tab for days 1-3 then 1 tab PO twice daily for days 4-7, then 2 tabs twice daily for a total of 12 weeks., Disp: 190 tablet, Rfl: 0 No current facility-administered medications for this visit.   Facility-Administered Medications Ordered in Other Visits:  .  0.9 %  sodium chloride infusion, , Intravenous, Continuous, Kefalas, Manon Hilding, PA-C, Last Rate: 10 mL/hr at 01/13/17 1330  Physical exam:  Vitals:   12/26/17 1135  BP: (!) 99/58  Pulse: (!) 112  Resp: 20  Temp: 98.5 F (36.9 C)  TempSrc: Oral  SpO2: (!) 88%  Weight: 212 lb 6.4 oz (96.3 kg)   Physical Exam  Constitutional: He is oriented to person, place, and time. He appears dehydrated. He has a sickly appearance. He appears distressed.  He is pale.  He is a visibly short of breath.  Oxygen saturations on initial presentation were 88%.  On recheck oxygen saturations were 92%.  He is not on oxygen.  HENT:  Head: Normocephalic and atraumatic.  Mouth/Throat: Mucous membranes are pale and dry.  Eyes: Pupils are equal, round, and reactive to light.  Neck: Normal range of motion. Neck supple.  Cardiovascular: Regular rhythm. Tachycardia present.  Patient is tachycardic.  Heart rate 112.  He is hypotensive 99/58.  Pulmonary/Chest: He has wheezes.  Wheezes auscultated bilaterally.  Abdominal: Soft. Normal appearance and bowel sounds are normal.  Musculoskeletal: Normal range of motion.  Patient is a weak.  He is placed in wheelchair.    Lymphadenopathy:    He has no cervical adenopathy.    He has no axillary adenopathy.  Neurological: He is alert and oriented to person, place, and time.  Alert and oriented x3.  Skin: He is diaphoretic. There is pallor.     CMP Latest Ref Rng & Units 12/26/2017  Glucose 65 - 99 mg/dL 119(H)  BUN 6 - 20 mg/dL 10  Creatinine 0.61 - 1.24 mg/dL 0.70  Sodium 135 - 145 mmol/L 135  Potassium 3.5 - 5.1 mmol/L 3.4(L)  Chloride 101 - 111 mmol/L 94(L)  CO2 22 - 32 mmol/L 28  Calcium 8.9 - 10.3 mg/dL 8.8(L)  Total Protein 6.5 - 8.1 g/dL 8.1  Total Bilirubin 0.3 - 1.2 mg/dL 1.2  Alkaline Phos 38 - 126 U/L 156(H)  AST 15 - 41 U/L 58(H)  ALT 17 - 63 U/L 37   CBC Latest Ref Rng & Units 12/26/2017  WBC 4.0 - 10.5 K/uL 8.0  Hemoglobin 13.0 - 17.0 g/dL 14.9  Hematocrit 39.0 - 52.0 % 44.1  Platelets 150 - 400 K/uL 166    No images are attached to the encounter.  Dg Chest 2 View  Result Date: 12/26/2017 CLINICAL DATA:  Shortness of breath and cough since December 24, 2017 EXAM: CHEST  2 VIEW COMPARISON:  November 21, 2017 FINDINGS: The heart size and mediastinal contours are stable. Right central venous line is identified with distal tip in the superior vena cava. Moderate right pleural effusion is unchanged. The left lung is clear. There no pulmonary edema. The visualized skeletal structures are stable. IMPRESSION: Moderate right pleural effusion with consolidation  of right lung base unchanged compared prior exam. Electronically Signed   By: Abelardo Diesel M.D.   On: 12/26/2017 11:15   Ct Angio Chest Pe W Or Wo Contrast  Result Date: 12/26/2017 CLINICAL DATA:  Shortness of breast since 12/23/2017, persistent cough, coughing up blood, history of squamous cell carcinoma RIGHT lung diagnosis in August 2017 with ongoing radiation therapy, history hypertension, GERD, asthma, smoker EXAM: CT ANGIOGRAPHY CHEST WITH CONTRAST TECHNIQUE: Multidetector CT imaging of the chest was performed using the standard  protocol during bolus administration of intravenous contrast. Multiplanar CT image reconstructions and MIPs were obtained to evaluate the vascular anatomy. CONTRAST:  119m ISOVUE-370 IOPAMIDOL (ISOVUE-370) INJECTION 76% IV COMPARISON:  10/04/2017 FINDINGS: Cardiovascular: Atherosclerotic calcification aorta without aneurysm or dissection. Small pericardial effusion. Mild dilatation of cardiac chambers. Pulmonary arteries adequately opacified. Filling defects identified within LEFT lower lobe pulmonary arteries consistent with pulmonary emboli. RV/LV ratio = 0.99, mildly elevated. Mediastinum/Nodes: Mediastinal shift to the RIGHT. Diffuse thickening of the esophagus which may be related to esophagitis, question from reflux disease, infection or radiation therapy. Mediastinal adenopathy: Enlarged AP window node 19 mm short axis image 48 previously 9 mm. LEFT para-aortic node 14 mm short axis previously 10 mm. LEFT superior mediastinal node 12 mm previously 10 mm. Increased soft tissue question adenopathy versus tumor at RIGHT hilum extending into subcarinal region. Subcarinal node 15 mm short axis previously 9 mm. Base of cervical region unremarkable. LEFT retropectoral node 9 mm, new. LEFT axillary node 12 mm, new. Lungs/Pleura: Chronic RIGHT pleural effusion. Emphysematous and bronchitic changes. 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 Scattered subpleural interstitial changes in RIGHT lung again identified. Patchy opacities in RIGHT upper lobe centrally could represent infection or developing tumor, new. Additional hazy ground-glass opacities throughout LEFT lung favoring infection. No LEFT pleural effusion. Upper Abdomen: Periportal nodes 14 mm diameter image 104 previously 13 mm and 12 mm. Spleen appears mildly prominent size. Question mild impact enlargement. Remaining visualized upper abdomen unremarkable. Musculoskeletal: Diffuse osseous demineralization. Review of the  MIP images confirms the above findings. IMPRESSION: Small pulmonary emboli in LEFT lower lobe. Emphysematous changes with scattered patchy ground-glass infiltrates in LEFT lung favoring infection. Persistent RIGHT pleural effusion with post radiation therapy changes of the RIGHT lung. Patchy areas of airspace opacification within RIGHT lung could represent infection or developing tumor recommend attention on follow-up imaging. Small pericardial effusion, minimally increased. Increased mediastinal and RIGHT hilar adenopathy. Question mild hepatosplenomegaly. Probable esophagitis, suspect related to radiation therapy though can be seen with reflux and infection. Findings called to JFaythe CasaNP on 12/26/2017 at 1307 hours. Aortic Atherosclerosis (ICD10-I70.0) and Emphysema (ICD10-J43.9). Electronically Signed   By: MLavonia DanaM.D.   On: 12/26/2017 13:10     Assessment and plan- Patient is a 57y.o. male who presents with many alarming symptoms: Hemoptysis, shortness of breath, cough, weakness, fatigue and intermittent chest pain.  On initial examination patient appears ill.  He is pale.  He is tachycardic and hypotensive.  Oxygen saturations are 88%.  He is placed on 2 L oxygen.   1.  STAT chest x-ray prior to evaluation. 2.  STAT labs ( CBC with diff, CMET and D-Dimer).  3.  STAT CTA.  Patient fortunately was able to be wheeled down to have his stat CTA completed immediately.  Patient encouraged to be seen in the emergency room versus the outpatient setting but again he refused.  CTA revealed a small pulmonary emboli and left lower lobe.  It also identified patchy areas of airspace opacification within the right lung indicating possible infection or tumor development.  There also were scattered patchy groundglass infiltrates in the left lung favoring infection.  4.  D-dimer elevated at 3.16.  5.  Patient started on Eliquis starter pack.  Consulted Dr. Grayland Ormond, up-to-date and [facility] for starting dose.  He is to start with 10 mg twice daily for 7 days and then reduce dose to 5 mg twice daily for remaining doses.  Additionally he was started on Levaquin 500 mg daily, prednisone Dosepak for 6 days and given a refill on his albuterol inhaler.  6.  Discussed with Dr. Grayland Ormond regarding the need for Lovenox for 2 days prior to Eliquis becoming therapeutic. Given the size of the PE and the expense of the medication it was recommended to just start the Eliquis without the Lovenox bridge.  Again, it was recommended that the patient be seen in the emergency room to be started on immediate anticoagulation such as heparin but he refused.  7.  He is scheduled to return on Monday for additional CT scans for restaging and to see Dr. Walden Field to review scans.  He can be reevaluated by Dr. Walden Field for symptoms.  Return to clinic as scheduled.  Patient was significantly symptomatic and I was very concerned for him to go home.  Patient aware of NP's concerns but refused evaluation in the emergency room.  Patient advised and educated on complications that potentially could arise should he go home and he was okay with them.  He was agreeable to return if symptoms worsened.  Oxygen saturations prior to leaving clinic where stable in low 90s without oxygen.  Patient instructed to begin medications as soon as he gets home.  We will call tomorrow morning to check on patient.   Visit Diagnosis 1. Shortness of breath   2. Malignant neoplasm of lung, unspecified laterality, unspecified part of lung ([place])   3. Port-A-Cath in place     Patient expressed understanding and was in agreement with this plan. He also understands that He can call clinic at any time with any questions, concerns, or complaints.   Greater than 50% was spent in counseling and coordination of care with this patient including but not limited to discussion of the relevant topics above (See A&P) including, but not limited to diagnosis  and management of acute and chronic medical conditions.    [name], AGNP-C [place] at [place]- [phone] Pager- [phone] 12/27/2017 8:03 AM

## 2017-12-26 NOTE — Progress Notes (Signed)
Patient sent to Radiology for stat CT angio to rule out PE. Patient in wheelchair and wheeled to Radiology by NT. Fall band on patient. Powerport accessed with powerport needle.   Erik [name] presented for Portacath access and flush. Portacath located rt chest wall accessed with  H 20 needle. No blood return. Portacath flushed with 78ml NS and 500U/32ml Heparin and needle removed intact by Anastasio Champion RN. Procedure without incident. Patient tolerated procedure well.  Discharge instructions reviewed with patient by Rulon Abide NP (albuterol inhaler, Eliquis, Steroid, and Antibiotic).

## 2017-12-28 ENCOUNTER — Ambulatory Visit (HOSPITAL_COMMUNITY)
Admission: RE | Admit: 2017-12-28 | Discharge: 2017-12-28 | Disposition: A | Discharge status: Home / Self Care | Payer: Medicare Other | Source: Ambulatory Visit | Attending: Oncology | Admitting: Oncology

## 2017-12-28 DIAGNOSIS — J90 Pleural effusion, not elsewhere classified: Secondary | ICD-10-CM | POA: Diagnosis not present

## 2017-12-28 DIAGNOSIS — C3491 Malignant neoplasm of unspecified part of right bronchus or lung: Secondary | ICD-10-CM | POA: Diagnosis present

## 2017-12-28 MED ORDER — IOPAMIDOL (ISOVUE-300) INJECTION 61%
100.0000 mL | Freq: Once | INTRAVENOUS | Status: AC | PRN
  Administered 2017-12-28: 100 mL via INTRAVENOUS

## 2018-01-02 ENCOUNTER — Encounter (HOSPITAL_COMMUNITY): Payer: Self-pay

## 2018-01-02 ENCOUNTER — Inpatient Hospital Stay (HOSPITAL_COMMUNITY): Payer: Medicare Other

## 2018-01-02 ENCOUNTER — Inpatient Hospital Stay (HOSPITAL_BASED_OUTPATIENT_CLINIC_OR_DEPARTMENT_OTHER): Payer: Medicare Other | Admitting: Internal Medicine

## 2018-01-02 ENCOUNTER — Other Ambulatory Visit (HOSPITAL_COMMUNITY): Payer: Self-pay | Admitting: *Deleted

## 2018-01-02 ENCOUNTER — Encounter (HOSPITAL_COMMUNITY): Payer: Self-pay | Admitting: Lab

## 2018-01-02 ENCOUNTER — Other Ambulatory Visit: Payer: Self-pay

## 2018-01-02 ENCOUNTER — Ambulatory Visit (HOSPITAL_COMMUNITY): Payer: Medicare Other | Admitting: Internal Medicine

## 2018-01-02 VITALS — BP 129/88 | HR 97 | Temp 98.0°F | Resp 20

## 2018-01-02 VITALS — BP 117/74 | HR 113 | Resp 20 | Ht 72.0 in | Wt 221.0 lb

## 2018-01-02 DIAGNOSIS — C3431 Malignant neoplasm of lower lobe, right bronchus or lung: Secondary | ICD-10-CM

## 2018-01-02 DIAGNOSIS — Z923 Personal history of irradiation: Secondary | ICD-10-CM | POA: Diagnosis not present

## 2018-01-02 DIAGNOSIS — R911 Solitary pulmonary nodule: Secondary | ICD-10-CM | POA: Diagnosis not present

## 2018-01-02 DIAGNOSIS — C3491 Malignant neoplasm of unspecified part of right bronchus or lung: Secondary | ICD-10-CM

## 2018-01-02 DIAGNOSIS — Z5111 Encounter for antineoplastic chemotherapy: Secondary | ICD-10-CM | POA: Diagnosis not present

## 2018-01-02 DIAGNOSIS — Z7901 Long term (current) use of anticoagulants: Secondary | ICD-10-CM | POA: Diagnosis not present

## 2018-01-02 DIAGNOSIS — J90 Pleural effusion, not elsewhere classified: Secondary | ICD-10-CM

## 2018-01-02 DIAGNOSIS — F17210 Nicotine dependence, cigarettes, uncomplicated: Secondary | ICD-10-CM

## 2018-01-02 DIAGNOSIS — Z79899 Other long term (current) drug therapy: Secondary | ICD-10-CM

## 2018-01-02 LAB — CBC WITH DIFFERENTIAL/PLATELET
Basophils Absolute: 0 10*3/uL (ref 0.0–0.1)
Basophils Relative: 0 %
EOS ABS: 0.1 10*3/uL (ref 0.0–0.7)
EOS PCT: 1 %
HCT: 47.9 % (ref 39.0–52.0)
Hemoglobin: 15.4 g/dL (ref 13.0–17.0)
LYMPHS ABS: 2.2 10*3/uL (ref 0.7–4.0)
LYMPHS PCT: 20 %
MCH: 32.1 pg (ref 26.0–34.0)
MCHC: 32.2 g/dL (ref 30.0–36.0)
MCV: 99.8 fL (ref 78.0–100.0)
MONO ABS: 0.9 10*3/uL (ref 0.1–1.0)
Monocytes Relative: 8 %
Neutro Abs: 7.8 10*3/uL — ABNORMAL HIGH (ref 1.7–7.7)
Neutrophils Relative %: 71 %
PLATELETS: 188 10*3/uL (ref 150–400)
RBC: 4.8 MIL/uL (ref 4.22–5.81)
RDW: 14.8 % (ref 11.5–15.5)
WBC: 11.1 10*3/uL — AB (ref 4.0–10.5)

## 2018-01-02 LAB — MAGNESIUM: MAGNESIUM: 1.9 mg/dL (ref 1.7–2.4)

## 2018-01-02 LAB — COMPREHENSIVE METABOLIC PANEL
ALBUMIN: 3.2 g/dL — AB (ref 3.5–5.0)
ALT: 48 U/L (ref 17–63)
AST: 51 U/L — AB (ref 15–41)
Alkaline Phosphatase: 142 U/L — ABNORMAL HIGH (ref 38–126)
Anion gap: 11 (ref 5–15)
BILIRUBIN TOTAL: 0.6 mg/dL (ref 0.3–1.2)
BUN: 13 mg/dL (ref 6–20)
CHLORIDE: 98 mmol/L — AB (ref 101–111)
CO2: 32 mmol/L (ref 22–32)
CREATININE: 0.89 mg/dL (ref 0.61–1.24)
Calcium: 9.4 mg/dL (ref 8.9–10.3)
GFR calc Af Amer: >60 mL/min (ref >60)
GLUCOSE: 135 mg/dL — AB (ref 65–99)
POTASSIUM: 4.3 mmol/L (ref 3.5–5.1)
Sodium: 141 mmol/L (ref 135–145)
TOTAL PROTEIN: 7.7 g/dL (ref 6.5–8.1)

## 2018-01-02 LAB — CORTISOL: Cortisol, Plasma: 2.2 ug/dL

## 2018-01-02 MED ORDER — GI COCKTAIL ~~LOC~~
30.0000 mL | Freq: Once | ORAL | Status: AC
  Administered 2018-01-02: 30 mL via ORAL
  Filled 2018-01-02: qty 30

## 2018-01-02 MED ORDER — SODIUM CHLORIDE 0.9 % IV SOLN
Freq: Once | INTRAVENOUS | Status: AC
  Administered 2018-01-02: 13:00:00 via INTRAVENOUS

## 2018-01-02 MED ORDER — SODIUM CHLORIDE 0.9% FLUSH
10.0000 mL | INTRAVENOUS | Status: DC | PRN
  Administered 2018-01-02: 10 mL
  Filled 2018-01-02: qty 10

## 2018-01-02 MED ORDER — HEPARIN SOD (PORK) LOCK FLUSH 100 UNIT/ML IV SOLN
500.0000 [IU] | Freq: Once | INTRAVENOUS | Status: AC | PRN
  Administered 2018-01-02: 500 [IU]

## 2018-01-02 MED ORDER — SODIUM CHLORIDE 0.9 % IV SOLN
1200.0000 mg | Freq: Once | INTRAVENOUS | Status: AC
  Administered 2018-01-02: 1200 mg via INTRAVENOUS
  Filled 2018-01-02: qty 20

## 2018-01-02 NOTE — Progress Notes (Unsigned)
Referral sent to Dr Luan Pulling. Talked to office and they will review records and contact patient.  Records faxed on 1/28

## 2018-01-02 NOTE — Patient Instructions (Signed)
[place] at [facility] Discharge Instructions  RECOMMENDATIONS MADE BY THE CONSULTANT AND ANY TEST RESULTS WILL BE SENT TO YOUR REFERRING PHYSICIAN.    Thank you for choosing [place] at [facility] to provide your oncology and hematology care.  To afford each patient quality time with our provider, please arrive at least 15 minutes before your scheduled appointment time.    If you have a lab appointment with the [facility] please come in thru the  Main Entrance and check in at the main information desk  You need to re-schedule your appointment should you arrive 10 or more minutes late.  We strive to give you quality time with our providers, and arriving late affects you and other patients whose appointments are after yours.  Also, if you no show three or more times for appointments you may be dismissed from the clinic at the providers discretion.     Again, thank you for choosing [facility].  Our hope is that these requests will decrease the amount of time that you wait before being seen by our physicians.       _____________________________________________________________  Should you have questions after your visit to [facility], please contact our office at [phone] between the hours of 8:30 a.m. and 4:30 p.m.  Voicemails left after 4:30 p.m. will not be returned until the following business day.  For prescription refill requests, have your pharmacy contact our office.       Resources For Cancer Patients and their Caregivers ? American Cancer Society: Can assist with transportation, wigs, general needs, runs Look Good Feel Better.        971-358-2297 ? Cancer Care: Provides financial assistance, online support groups, medication/co-pay assistance.  1-800-813-HOPE 458-309-8014) ? [place] Assists [place] cancer patients and their families through emotional , educational  and financial support.  343-708-8939 ? [place] DSS Where to apply for food stamps, Medicaid and utility assistance. (669)037-4509 ? [facility]: Transportation to medical appointments. 670-147-7114 ? Social Security Administration: May apply for disability if have a Stage IV cancer. ([phone] 009-9613 360-886-6393 ? [employer], Disability and Transit Services: Assists with nutrition, care and transit needs. [place] Support Programs: @10RELATIVEDAYS @ > Cancer Support Group  2nd Tuesday of the month 1pm-2pm, Journey Room  > Creative Journey  3rd Tuesday of the month 1130am-1pm, Journey Room  > Look Good Feel Better  1st Wednesday of the month 10am-12 noon, Journey Room (Call [facility] to register [phone])

## 2018-01-02 NOTE — Progress Notes (Signed)
Per MD, ok to treat today.  Treatment given per orders. Patient tolerated it well without problems. Vitals stable and discharged home from clinic ambulatory. Follow up as scheduled.

## 2018-01-03 ENCOUNTER — Ambulatory Visit (HOSPITAL_COMMUNITY): Payer: Medicare Other

## 2018-01-03 LAB — ACTH: C206 ACTH: 1.9 pg/mL — ABNORMAL LOW (ref 7.2–63.3)

## 2018-01-05 NOTE — Progress Notes (Signed)
Diagnosis No diagnosis found.  Staging Cancer Staging Adenocarcinoma of right lung [facility]) Staging form: Lung, AJCC 7th Edition - Clinical stage from 07/23/2016: Stage IIIA (T2b, N2, M0) - Signed by Baird Cancer, PA-C on 07/23/2016 - Clinical stage from 07/28/2017: Stage IV (M1a) - Signed by Holley Bouche, NP on 07/28/2017   Assessment and Plan: 1.  Stage IV non-small cell lung cancer.  The patient is here today for Tecentriq and to go over his scans.  CT angio of the chest that was done on 12/26/2017 shows small PE in the left lower lobe with emphysematous changes.  There is also a right pleural effusion.  CT of the abdomen and pelvis done 12/28/2017 shows no evidence of metastatic disease.  There is evidence of liver cirrhosis.  He will proceed with treatment today as counts are adequate for chemotherapy.  He will return to clinic in 3 weeks for his next cycle Tecentriq.  2.  PE.  He is currently on Eliquis.  I have discussed with the patient that some of his respiratory symptoms may be attributable to his recent diagnosis of PE.  We will continue to monitor for symptoms as therapy proceeds.  3.  Emphysema.  This was noted on recent imaging.  He will be referred to pulmonary to help with inhalers.  Interval History:  57 y.o. presenting to the [facility] for continued clinical monitoring while receiving palliative systemic immunotherapy with Gildardo Pounds which was started in August 2018 for diagnosis of adenocarcinoma of the right lung, stage IV (T3 N2 M1a).    Current Status: Patient is here today for follow-up prior to Tecentriq.    He reports he just completed prednisone.  He has been using inhalers but feels at times they may not be helping.     Adenocarcinoma of right lung ([place])   07/01/2016 PET scan    6.3 x 3.7 cm right lower lobe mass, suspicious for primary bronchogenic neoplasm, as described above. Hypermetabolic thoracic nodal metastases, as above. Additional right perihilar  hypermetabolism, indeterminate. Associated right middle lobe atelectasis/collapse.      07/19/2016 Procedure    Bronchoscopy with brushings and biopsies and endobronchial ultrasound with mediastinal lymph node aspirations by Dr. Roxan Hockey      07/21/2016 Pathology Results    Lung, biopsy, Right Middle Lobe - LUNG TISSUE WITH SQUAMOUS METAPLASIA. - NO MALIGNANCY IDENTIFIED.      07/21/2016 Pathology Results    FINE NEEDLE ASPIRATION, ENDOSCOPIC (A) LEVEL 7 (SPECIMEN 1 OF 3, COLLECTED ON 07/19/16): MALIGNANT CELLS CONSISTENT WITH ADENOCARCINOMA.      07/21/2016 Pathology Results    FINE NEEDLE ASPIRATION, EBUS, 4R, B (SPECIMEN 2 OF 3, COLLECTED 07/19/16): MALIGNANT CELLS CONSISTENT WITH ADENOCARCINOMA.      07/21/2016 Pathology Results    FINE NEEDLE ASPIRATION, EBUS, BRUSHING, RIGHT MIDDLE LOBE, D (SPECIMEN 3 OF 3, COLLECTED 07/19/16): MALIGNANT CELLS CONSISTENT WITH ADENOCARCINOMA.      07/22/2016 Imaging    MRI brain- No evidence of intracranial metastases.      08/17/2016 - 09/20/2016 Chemotherapy    The patient had palonosetron (ALOXI) injection 0.25 mg, 0.25 mg, Intravenous,  Once, 1 of 1 cycle  CISplatin (PLATINOL) 123 mg in sodium chloride 0.9 % 500 mL chemo infusion, 50 mg/m2 = 123 mg, Intravenous,  Once, 1 of 1 cycle  etoposide (VEPESID) 120 mg in sodium chloride 0.9 % 500 mL chemo infusion, 50 mg/m2 = 120 mg, Intravenous,  Once, 1 of 1 cycle  fosaprepitant (EMEND) 150 mg, dexamethasone (  DECADRON) 12 mg in sodium chloride 0.9 % 145 mL IVPB, , Intravenous,  Once, 1 of 1 cycle  ondansetron (ZOFRAN) 8 mg in sodium chloride 0.9 % 50 mL IVPB, , Intravenous,  Once, 2 of 6 cycles  for chemotherapy treatment.        08/17/2016 -  Radiation Therapy         11/15/2016 Imaging    CT CAP- Interval response to therapy. The previously demonstrated mediastinal and right hilar adenopathy and resulting right middle lobe atelectasis have all improved. 2. No discrete residual lung  masses are identified. There is new multifocal ground-glass opacity within the right lower lobe, and to a lesser extent in the left upper lobe. These are probably inflammatory/treatment related. 3. No evidence of abdominopelvic metastatic disease. Stable prominent lymph nodes in the upper abdomen, likely reactive. 4. Decompressed mid SVC without specific signs of SVC occlusion.      12/02/2016 -  Chemotherapy    Imfinzi (durvalumab) immunotherapy every 2 weeks x up to 1 year      01/03/2017 Procedure    EGD by Dr. [name], colonoscopy aborted as "patient forgot to take other half of preparation). Esophagitis. likely radiation-induced ?Dilated.  Erythematous mucosa in the stomach. Biopsied. Normal duodenal bulb and second portion of the duodenum.      02/14/2017 Imaging    CT chest- 1. Development of a small to moderate right-sided pleural effusion with minimal loculation anteriorly. 2. Worsened right-sided aeration with right middle and lower lobe consolidation. As this has a geographic distribution, this could be radiation induced or represent infection. Depending on clinical concern of progressive disease, thoracentesis and/or PET may be informative. 3. Development of mild right paratracheal adenopathy, most likely reactive. Recommend attention on follow-up. 4. Subtle findings which are highly suspicious for mild cirrhosis. Upper abdominal adenopathy is similar and likely reactive. 5. Persistent right lower and improved left upper lobe ground-glass opacities are favored to be infectious or inflammatory.      06/01/2017 Imaging    CT chest  IMPRESSION: 1. Evolving radiation changes in the medial right hemithorax. 2. Stable moderate size right pleural effusion without definite nodular components. 3. No signs of local recurrence or progressive metastatic disease. No residual enlarged thoracic lymph nodes. 4. Stable appearance of the visualized upper abdomen with probable cirrhosis  and reactive adenopathy in the porta hepatis.      07/15/2017 Procedure    Therapeutic thoracentesis performed today; 1.3 L removed.  Fluid sent for cytology.       07/15/2017 Pathology Results    Pleural fluid NEGATIVE for malignancy by cytology.       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 Paramediastinal radiation change and loculated right pleural effusion appear unchanged from 07/14/2017. 2. Borderline prevascular lymph node within the anterior mediastinum is mildly increased in size from previous exam. 3. There is a new 5 mm lung nodule within the left lower lobe. Nonspecific in appearance. Attention on follow-up imaging advise. Other small nonspecific nodules in the left lung are stable. 4. Morphologic features of the liver compatible with cirrhosis. Enlarged upper abdominal lymph nodes are nonspecific and likely reactive. 5. Stable indeterminate low-attenuation focus in the posterior right liver dome. Previously characterized on MRI from 07/29/2017 as reflecting post treatment changes from external beam radiation to the lung. 6.  Emphysema (ICD10-J43.9).  Problem List Patient Active Problem List   Diagnosis Date Noted  . Taking multiple medications for chronic disease [R69] 03/08/2017  . 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 Loss of weight [R63.4] 12/10/2016  . Encounter for screening colonoscopy [Z12.11] 12/10/2016  . Esophageal dysphagia [R13.10] 12/10/2016  . Abdominal pain, epigastric [R10.13] 12/10/2016  . Adenocarcinoma of right lung ([place]) [C34.91] 07/23/2016  . GERD [K21.9] 01/13/2009  . ACTINIC KERATOSIS [L57.0] 01/13/2009  . ANXIETY [F41.1] 12/27/2008  . DEPRESSION [F32.9] 12/27/2008  . ASTHMA [J45.909] 12/27/2008  . OSTEOARTHRITIS [M19.90] 12/27/2008  . LOW BACK PAIN [M54.5] 12/27/2008    Past Medical History Past Medical History:   Diagnosis Date  . Arnold-Chiari syndrome ([place])   . Arthritis   . Asthma   . Chronic back pain   . Depression   . Dyspnea    with exertion   . GERD (gastroesophageal reflux disease)   . Hypertension   . Seasonal allergies   . Spinal stenosis of lumbar region   . 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 Wheezing     Past Surgical History Past Surgical History:  Procedure Laterality Date  . BACK SURGERY     5 total  . BIOPSY  01/03/2017   Procedure: BIOPSY;  Surgeon: Daneil Dolin, MD;  [facility];  Service: Endoscopy;;  gastric  . COLONOSCOPY WITH PROPOFOL N/A 04/04/2017   Procedure: COLONOSCOPY WITH PROPOFOL;  Surgeon: Daneil Dolin, MD;  [facility];  Service: Endoscopy;  Laterality: N/A;  8:45am  . ESOPHAGOGASTRODUODENOSCOPY (EGD) WITH PROPOFOL N/A 01/03/2017   Procedure: ESOPHAGOGASTRODUODENOSCOPY (EGD) WITH PROPOFOL;  Surgeon: Daneil Dolin, MD;  [facility];  Service: Endoscopy;  Laterality: N/A;  . Venia Minks DILATION N/A 01/03/2017   Procedure: Venia Minks DILATION;  Surgeon: Daneil Dolin, MD;  [facility];  Service: Endoscopy;  Laterality: N/A;  . MULTIPLE EXTRACTIONS WITH ALVEOLOPLASTY N/A 07/08/2014   Procedure: MULTIPLE EXTRACION WITH ALVEOLOPLASTY with EXCISION LESION RIGHT SIDE OF TONGUE;  Surgeon: Gae Bon, DDS;  Location: [facility];  Service: Oral Surgery;  Laterality: N/A;  . PORTACATH PLACEMENT Right 07/30/2016   Procedure: INSERTION PORT-A-CATH;  Surgeon: Vickie Epley, MD;  Location: [facility];  Service: Vascular;  Laterality: Right;  [name] VIDEO BRONCHOSCOPY WITH ENDOBRONCHIAL ULTRASOUND N/A 07/19/2016   Procedure: VIDEO BRONCHOSCOPY WITH ENDOBRONCHIAL ULTRASOUND;  Surgeon: [name], MD;  Location: [facility];  Service: Thoracic;  Laterality: N/A;    Family History Family History  Problem Relation Age of Onset  . Cancer Mother        breast cancer  . Heart failure Brother   . Colon cancer Neg  Hx        not sure, ?grandfather and/or uncle     Social History  reports that he has been smoking cigarettes.  He has a 9.50 pack-year smoking history. He has quit using smokeless tobacco. His smokeless tobacco use included chew. He reports that he drinks about 1.2 oz of alcohol per week. He reports that he uses drugs. Drug: Marijuana. Frequency: 7.00 times per week.  Medications  Current Outpatient Medications:  .  albuterol (PROVENTIL HFA;VENTOLIN HFA) 108 (90 Base) MCG/ACT inhaler, Inhale 2 puffs into the lungs every 6 (six) hours as needed for wheezing., Disp: 1 Inhaler, Rfl: 0 .  ALPRAZolam (XANAX) 1 MG tablet, Take 1 mg by mouth 2 (two) times daily as needed for anxiety or sleep (takes 1 tablet and bedtime for  sleep and 1 dose during the day only if needed). , Disp: , Rfl:  .  amLODipine (NORVASC) 5 MG tablet, Take 5 mg by mouth daily. , Disp: , Rfl:  .  Artificial Tear Solution (SOOTHE XP) SOLN, Apply 2 drops to eye daily., Disp: , Rfl:  .  Atezolizumab (TECENTRIQ IV), Inject into the vein. Every 3 weeks, Disp: , Rfl:  .  budesonide-formoterol (SYMBICORT) 160-4.5 MCG/ACT inhaler, Inhale 2 puffs into the lungs daily. , Disp: , Rfl:  .  ELIQUIS STARTER PACK (ELIQUIS STARTER PACK) 5 MG TABS, Take as directed on package: start with two-67m tablets twice daily for 7 days. On day 8, switch to one-522mtablet twice daily., Disp: 1 each, Rfl: 0 .  escitalopram (LEXAPRO) 20 MG tablet, Take 20 mg by mouth daily. , Disp: , Rfl:  .  gabapentin (NEURONTIN) 600 MG tablet, Take 600 mg by mouth 2 (two) times daily. , Disp: , Rfl:  .  guaiFENesin (MUCINEX) 600 MG 12 hr tablet, Take 600 mg by mouth 2 (two) times daily as needed (for congestion.)., Disp: , Rfl:  .  hydrochlorothiazide (MICROZIDE) 12.5 MG capsule, , Disp: , Rfl:  .  lactulose (CHRONULAC) 10 GM/15ML solution, Take 15 mLs (10 g total) by mouth 3 (three) times daily as needed for mild constipation or moderate constipation., Disp: 240 mL, Rfl:  0 .  levofloxacin (LEVAQUIN) 500 MG tablet, Take 1 tablet (500 mg total) by mouth daily., Disp: 10 tablet, Rfl: 0 .  lidocaine (XYLOCAINE) 2 % solution, Use as directed 20 mLs in the mouth or throat every 6 (six) hours as needed (for mouth pain (used prior to eating when needed))., Disp: , Rfl:  .  lidocaine-prilocaine (EMLA) cream, Apply a quarter size amount to port site 1 hour prior to chemo. Do not rub in. Cover with plastic wrap. (Patient not taking: Reported on 01/02/2018), Disp: 30 g, Rfl: 2 .  methadone (DOLOPHINE) 10 MG tablet, Take 20 mg by mouth 5 (five) times daily. Takes 2 tablets 5 times daily, Disp: , Rfl:  .  mirtazapine (REMERON) 15 MG tablet, Take 1 tablet by mouth at bedtime., Disp: , Rfl:  .  Multiple Vitamin (MULTIVITAMIN WITH MINERALS) TABS tablet, Take 1 tablet by mouth daily. Centrum, Disp: , Rfl:  .  ondansetron (ZOFRAN) 8 MG tablet, TAKE 1 TABLET BY MOUTH EVERY 8 HOURS AS NEEDED FOR NAUSEA AND VOMITING., Disp: 30 tablet, Rfl: 2 .  pantoprazole (PROTONIX) 40 MG tablet, TAKE (1) TABLET BY MOUTH TWICE DAILY., Disp: 60 tablet, Rfl: 3 .  predniSONE (STERAPRED UNI-PAK 21 TAB) 10 MG (21) TBPK tablet, Take as prescribed., Disp: 21 tablet, Rfl: 0 .  prochlorperazine (COMPAZINE) 10 MG tablet, TAKE 1 TABLET EVERY 6 HOURS AS NEEDED FOR NAUSEA AND VOMITING., Disp: 60 tablet, Rfl: 3 .  tiZANidine (ZANAFLEX) 4 MG tablet, Take 4 mg by mouth every 8 (eight) hours as needed for muscle spasms. , Disp: , Rfl:  .  varenicline (CHANTIX) 0.5 MG tablet, Initially take 1 tab for days 1-3 then 1 tab PO twice daily for days 4-7, then 2 tabs twice daily for a total of 12 weeks., Disp: 190 tablet, Rfl: 0 No current facility-administered medications for this visit.   Facility-Administered Medications Ordered in Other Visits:  .  0.9 %  sodium chloride infusion, , Intravenous, Continuous, Kefalas, ThManon HildingPA-C, Last Rate: 10 mL/hr at 01/13/17 1330  Allergies Demeclocycline and Tetracyclines &  related  Review of Systems Review of Systems -  Oncology ROS as per HPI otherwise 12 point ROS is negative except for neuropathy, shortness of breath, constipation, trouble swallowing.   Physical Exam  Vitals Wt Readings from Last 3 Encounters:  01/02/18 221 lb (100.2 kg)  01/02/18 221 lb (100.2 kg)  12/26/17 212 lb 6.4 oz (96.3 kg)   Temp Readings from Last 3 Encounters:  01/02/18 98 F (36.7 C) (Oral)  12/26/17 98.5 F (36.9 C) (Oral)  12/12/17 98.5 F (36.9 C) (Oral)   BP Readings from Last 3 Encounters:  01/02/18 129/88  01/02/18 117/74  01/02/18 117/74   Pulse Readings from Last 3 Encounters:  01/02/18 97  01/02/18 (!) 113  01/02/18 (!) 113    Constitutional: Well-developed, well-nourished, and in no distress.   HENT:  Head: Normocephalic and atraumatic.  Mouth/Throat: No oropharyngeal exudate. Mucosa moist. Eyes: Pupils are equal, round, and reactive to light. Conjunctivae are normal. No scleral icterus.  Neck: Normal range of motion. Neck supple. No JVD present.  Cardiovascular: Normal rate, regular rhythm and normal heart sounds.  Exam reveals no gallop and no friction rub.   No murmur heard. Pulmonary/Chest: Coarse BS.   Abdominal: Soft. Bowel sounds are normal. No distension. There is no tenderness. There is no guarding.  Musculoskeletal: No edema or tenderness.  Lymphadenopathy: No cervical or supraclavicular adenopathy.  Neurological: Alert and oriented to person, place, and time. No cranial nerve deficit.  Skin: Skin is warm and dry. No rash noted. No erythema. No pallor.  Psychiatric: Affect and judgment normal.   Labs Orders Only on 01/02/2018  Component Date Value Ref Range Status  . Sodium 01/02/2018 141  135 - 145 mmol/L Final  . Potassium 01/02/2018 4.3  3.5 - 5.1 mmol/L Final  . Chloride 01/02/2018 98* 101 - 111 mmol/L Final  . CO2 01/02/2018 32  22 - 32 mmol/L Final  . Glucose, Bld 01/02/2018 135* 65 - 99 mg/dL Final  . BUN 01/02/2018 13   6 - 20 mg/dL Final  . Creatinine, Ser 01/02/2018 0.89  0.61 - 1.24 mg/dL Final  . Calcium 01/02/2018 9.4  8.9 - 10.3 mg/dL Final  . Total Protein 01/02/2018 7.7  6.5 - 8.1 g/dL Final  . Albumin 01/02/2018 3.2* 3.5 - 5.0 g/dL Final  . AST 01/02/2018 51* 15 - 41 U/L Final  . ALT 01/02/2018 48  17 - 63 U/L Final  . Alkaline Phosphatase 01/02/2018 142* 38 - 126 U/L Final  . Total Bilirubin 01/02/2018 0.6  0.3 - 1.2 mg/dL Final  . GFR calc non Af Amer 01/02/2018 >60  >60 mL/min Final  . GFR calc Af Amer 01/02/2018 >60  >60 mL/min Final   Comment: (NOTE) The eGFR has been calculated using the CKD EPI equation. This calculation has not been validated in all clinical situations. eGFR's persistently <60 mL/min signify possible Chronic Kidney Disease.   . Anion gap 01/02/2018 11  5 - 15 Final  . WBC 01/02/2018 11.1* 4.0 - 10.5 K/uL Final  . RBC 01/02/2018 4.80  4.22 - 5.81 MIL/uL Final  . Hemoglobin 01/02/2018 15.4  13.0 - 17.0 g/dL Final  . HCT 01/02/2018 47.9  39.0 - 52.0 % Final  . MCV 01/02/2018 99.8  78.0 - 100.0 fL Final  . MCH 01/02/2018 32.1  26.0 - 34.0 pg Final  . MCHC 01/02/2018 32.2  30.0 - 36.0 g/dL Final  . RDW 01/02/2018 14.8  11.5 - 15.5 % Final  . Platelets 01/02/2018 188  150 - 400 K/uL Final  . Neutrophils  Relative % 01/02/2018 71  % Final  . Neutro Abs 01/02/2018 7.8* 1.7 - 7.7 K/uL Final  . Lymphocytes Relative 01/02/2018 20  % Final  . Lymphs Abs 01/02/2018 2.2  0.7 - 4.0 K/uL Final  . Monocytes Relative 01/02/2018 8  % Final  . Monocytes Absolute 01/02/2018 0.9  0.1 - 1.0 K/uL Final  . Eosinophils Relative 01/02/2018 1  % Final  . Eosinophils Absolute 01/02/2018 0.1  0.0 - 0.7 K/uL Final  . Basophils Relative 01/02/2018 0  % Final  . Basophils Absolute 01/02/2018 0.0  0.0 - 0.1 K/uL Final  . Magnesium 01/02/2018 1.9  1.7 - 2.4 mg/dL Final  Lab on 01/02/2018  Component Date Value Ref Range Status  . R561 ACTH 01/02/2018 1.9* 7.2 - 63.3 pg/mL Final   Comment:  (NOTE) ACTH reference interval for samples collected between 7 and 10 AM. Performed At: [facility] [place], [place] [id number] Rush Farmer MD DY:[id number]   . Cortisol, Plasma 01/02/2018 2.2  ug/dL Final   Comment: (NOTE) AM    6.7 - 22.6 ug/dL PM   <10.0       ug/dL Performed at [facility], [place] [address], [place], [place] [address]      Pathology No orders of the defined types were placed in this encounter.   CT angio done January 2019.  IMPRESSION: Small pulmonary emboli in LEFT lower lobe.  Emphysematous changes with scattered patchy ground-glass infiltrates in LEFT lung favoring infection.  Persistent RIGHT pleural effusion with post radiation therapy changes of the RIGHT lung.  Patchy areas of airspace opacification within RIGHT lung could represent infection or developing tumor recommend attention on follow-up imaging.  Small pericardial effusion, minimally increased.  Increased mediastinal and RIGHT hilar adenopathy.  Question mild hepatosplenomegaly.  Probable esophagitis, suspect related to radiation therapy though can be seen with reflux and infection.  Findings called to [name] NP on 12/26/2017 at 1307 hours.  Aortic Atherosclerosis (ICD10-I70.0) and Emphysema (ICD10-J43.9).  CT abdomen and pelvis done 12/28/2016:  IMPRESSION: 1. No findings suspicious for metastatic disease in the abdomen or pelvis. 2. Stable nonspecific mild porta hepatis, portacaval and aortocaval adenopathy, more likely reactive. 3. Hepatic cirrhosis. Previously described bandlike post treatment change at the right liver dome has essentially resolved. No liver masses. 4. Ground-glass and subsolid nodular opacities at the lung bases appear decreased since 12/26/2017 chest CT, suggesting improving inflammatory opacities. Recommend short-term chest CT surveillance. 5. Stable chronic small to moderate right pleural effusion  with smooth right pleural thickening.   Zoila Shutter MD

## 2018-01-09 ENCOUNTER — Other Ambulatory Visit (HOSPITAL_COMMUNITY): Payer: Self-pay | Admitting: Oncology

## 2018-01-23 ENCOUNTER — Inpatient Hospital Stay (HOSPITAL_COMMUNITY): Payer: Medicare Other

## 2018-01-23 ENCOUNTER — Ambulatory Visit (HOSPITAL_COMMUNITY)
Admission: RE | Admit: 2018-01-23 | Discharge: 2018-01-23 | Disposition: A | Discharge status: Home / Self Care | Payer: Medicare Other | Source: Ambulatory Visit | Attending: Oncology | Admitting: Oncology

## 2018-01-23 ENCOUNTER — Inpatient Hospital Stay (HOSPITAL_BASED_OUTPATIENT_CLINIC_OR_DEPARTMENT_OTHER): Payer: Medicare Other | Admitting: Oncology

## 2018-01-23 ENCOUNTER — Other Ambulatory Visit: Payer: Self-pay

## 2018-01-23 ENCOUNTER — Encounter (HOSPITAL_COMMUNITY): Payer: Self-pay | Admitting: Oncology

## 2018-01-23 VITALS — BP 95/66 | HR 98 | Temp 98.7°F | Resp 20 | Wt 224.0 lb

## 2018-01-23 VITALS — BP 100/61 | HR 106 | Temp 98.5°F | Resp 20

## 2018-01-23 DIAGNOSIS — E871 Hypo-osmolality and hyponatremia: Secondary | ICD-10-CM

## 2018-01-23 DIAGNOSIS — K219 Gastro-esophageal reflux disease without esophagitis: Secondary | ICD-10-CM

## 2018-01-23 DIAGNOSIS — R062 Wheezing: Secondary | ICD-10-CM | POA: Insufficient documentation

## 2018-01-23 DIAGNOSIS — F329 Major depressive disorder, single episode, unspecified: Secondary | ICD-10-CM | POA: Insufficient documentation

## 2018-01-23 DIAGNOSIS — C3431 Malignant neoplasm of lower lobe, right bronchus or lung: Secondary | ICD-10-CM

## 2018-01-23 DIAGNOSIS — K7460 Unspecified cirrhosis of liver: Secondary | ICD-10-CM | POA: Insufficient documentation

## 2018-01-23 DIAGNOSIS — C3491 Malignant neoplasm of unspecified part of right bronchus or lung: Secondary | ICD-10-CM

## 2018-01-23 DIAGNOSIS — Z79899 Other long term (current) drug therapy: Secondary | ICD-10-CM

## 2018-01-23 DIAGNOSIS — Z923 Personal history of irradiation: Secondary | ICD-10-CM

## 2018-01-23 DIAGNOSIS — I959 Hypotension, unspecified: Secondary | ICD-10-CM

## 2018-01-23 DIAGNOSIS — I10 Essential (primary) hypertension: Secondary | ICD-10-CM | POA: Insufficient documentation

## 2018-01-23 DIAGNOSIS — F17210 Nicotine dependence, cigarettes, uncomplicated: Secondary | ICD-10-CM | POA: Insufficient documentation

## 2018-01-23 DIAGNOSIS — M1990 Unspecified osteoarthritis, unspecified site: Secondary | ICD-10-CM | POA: Insufficient documentation

## 2018-01-23 DIAGNOSIS — Z7901 Long term (current) use of anticoagulants: Secondary | ICD-10-CM | POA: Insufficient documentation

## 2018-01-23 DIAGNOSIS — F419 Anxiety disorder, unspecified: Secondary | ICD-10-CM | POA: Insufficient documentation

## 2018-01-23 DIAGNOSIS — M48061 Spinal stenosis, lumbar region without neurogenic claudication: Secondary | ICD-10-CM | POA: Insufficient documentation

## 2018-01-23 DIAGNOSIS — J439 Emphysema, unspecified: Secondary | ICD-10-CM

## 2018-01-23 DIAGNOSIS — J45909 Unspecified asthma, uncomplicated: Secondary | ICD-10-CM

## 2018-01-23 DIAGNOSIS — Z9221 Personal history of antineoplastic chemotherapy: Secondary | ICD-10-CM | POA: Insufficient documentation

## 2018-01-23 DIAGNOSIS — R0602 Shortness of breath: Secondary | ICD-10-CM | POA: Diagnosis present

## 2018-01-23 DIAGNOSIS — J90 Pleural effusion, not elsewhere classified: Secondary | ICD-10-CM

## 2018-01-23 DIAGNOSIS — E860 Dehydration: Secondary | ICD-10-CM

## 2018-01-23 DIAGNOSIS — R000 Tachycardia, unspecified: Secondary | ICD-10-CM

## 2018-01-23 DIAGNOSIS — J349 Unspecified disorder of nose and nasal sinuses: Secondary | ICD-10-CM | POA: Diagnosis not present

## 2018-01-23 LAB — COMPREHENSIVE METABOLIC PANEL
ALT: 38 U/L (ref 17–63)
ANION GAP: 12 (ref 5–15)
AST: 60 U/L — ABNORMAL HIGH (ref 15–41)
Albumin: 2.1 g/dL — ABNORMAL LOW (ref 3.5–5.0)
Alkaline Phosphatase: 241 U/L — ABNORMAL HIGH (ref 38–126)
BUN: 8 mg/dL (ref 6–20)
CALCIUM: 8.3 mg/dL — AB (ref 8.9–10.3)
CHLORIDE: 90 mmol/L — AB (ref 101–111)
CO2: 30 mmol/L (ref 22–32)
Creatinine, Ser: 0.77 mg/dL (ref 0.61–1.24)
GFR calc non Af Amer: >60 mL/min (ref >60)
Glucose, Bld: 159 mg/dL — ABNORMAL HIGH (ref 65–99)
Potassium: 3.7 mmol/L (ref 3.5–5.1)
SODIUM: 132 mmol/L — AB (ref 135–145)
Total Bilirubin: 1.6 mg/dL — ABNORMAL HIGH (ref 0.3–1.2)
Total Protein: 6.9 g/dL (ref 6.5–8.1)

## 2018-01-23 LAB — CBC WITH DIFFERENTIAL/PLATELET
BASOS PCT: 0 %
Basophils Absolute: 0 10*3/uL (ref 0.0–0.1)
Eosinophils Absolute: 0 10*3/uL (ref 0.0–0.7)
Eosinophils Relative: 0 %
HEMATOCRIT: 39.9 % (ref 39.0–52.0)
Hemoglobin: 13.1 g/dL (ref 13.0–17.0)
LYMPHS ABS: 1.5 10*3/uL (ref 0.7–4.0)
Lymphocytes Relative: 9 %
MCH: 31 pg (ref 26.0–34.0)
MCHC: 32.8 g/dL (ref 30.0–36.0)
MCV: 94.5 fL (ref 78.0–100.0)
MONOS PCT: 9 %
Monocytes Absolute: 1.6 10*3/uL — ABNORMAL HIGH (ref 0.1–1.0)
NEUTROS PCT: 82 %
Neutro Abs: 14.4 10*3/uL — ABNORMAL HIGH (ref 1.7–7.7)
Platelets: 275 10*3/uL (ref 150–400)
RBC: 4.22 MIL/uL (ref 4.22–5.81)
RDW: 14.5 % (ref 11.5–15.5)
WBC: 17.6 10*3/uL — AB (ref 4.0–10.5)

## 2018-01-23 LAB — LACTATE DEHYDROGENASE: LDH: 148 U/L (ref 98–192)

## 2018-01-23 MED ORDER — APIXABAN 5 MG PO TABS: 5.0000 mg | ORAL_TABLET | Freq: Two times a day (BID) | ORAL | 2 refills | Status: DC

## 2018-01-23 MED ORDER — HEPARIN SOD (PORK) LOCK FLUSH 100 UNIT/ML IV SOLN
500.0000 [IU] | Freq: Once | INTRAVENOUS | Status: AC
  Administered 2018-01-23: 500 [IU] via INTRAVENOUS

## 2018-01-23 MED ORDER — IPRATROPIUM-ALBUTEROL 0.5-2.5 (3) MG/3ML IN SOLN
3.0000 mL | Freq: Once | RESPIRATORY_TRACT | Status: DC
  Administered 2018-01-23: 3 mL via RESPIRATORY_TRACT

## 2018-01-23 MED ORDER — IPRATROPIUM-ALBUTEROL 0.5-2.5 (3) MG/3ML IN SOLN
RESPIRATORY_TRACT | Status: AC
  Filled 2018-01-23: qty 3

## 2018-01-23 MED ORDER — PREDNISONE 10 MG (21) PO TBPK: ORAL_TABLET | ORAL | 0 refills | Status: DC

## 2018-01-23 MED ORDER — LEVOFLOXACIN 500 MG PO TABS: 500.0000 mg | ORAL_TABLET | Freq: Every day | ORAL | 0 refills | Status: DC

## 2018-01-23 MED ORDER — SODIUM CHLORIDE 0.9 % IV SOLN
INTRAVENOUS | Status: DC
  Administered 2018-01-23: 11:00:00 via INTRAVENOUS

## 2018-01-23 MED ORDER — LACTULOSE 10 GM/15ML PO SOLN: 10.0000 g | Freq: Three times a day (TID) | ORAL | 0 refills | Status: AC | PRN

## 2018-01-23 NOTE — Progress Notes (Signed)
Diagnosis Wheezing - Plan: DG Chest 2 View  Staging Cancer Staging Adenocarcinoma of right lung [facility]) Staging form: Lung, AJCC 7th Edition - Clinical stage from 07/23/2016: Stage IIIA (T2b, N2, M0) - Signed by Baird Cancer, PA-C on 07/23/2016 - Clinical stage from 07/28/2017: Stage IV (M1a) - Signed by Holley Bouche, NP on 07/28/2017   Assessment and Plan:  1. Stage IV non-small cell lung cancer.  Patient is here today for Tecentriq. CT chest completed on 12/26/2017 showed a small pleural effusion and PE. He is currently on Eliquis. CT of the abdomen and pelvis completed on 12/28/2017 showed no evidence of metastatic disease. There was evidence of liver cirrhosis.   Hold Tecentriq today. Patient's labs indicate hyponatremia (132), hyperbilirubinemia (1.6) and leukocytosis (17.6). Bilateral lungs are rhonchus with wheezing. Oxygen saturation at rest are 93%. Patient is pale. Weight is up 3 pounds. Will get stat chest x-ray. He is afebrile but is hypotensive with a blood pressure of 95/66.  We will give 1 L normal saline over 1 hour for hyponatremia and hypotension. Patient will receive (Duoneb) nebulizer treatment during fluids today for wheezing/SOB.   Plan is for him to return in one week for next cycle of Tecentriq.  Results: Reviewed independently: Chest X-ray: Consolidation with volume loss throughout much of the right lung. There is pleural fluid on the right, better seen on recent CT.  Will treat with Levaquin 500 mg daily for 10 days. Sent to pharmacy. Will also give 6 day taper of steroids for his wheezing.  2.  PE.  Continues his Eliquis. He has not missed a dose. He needs a refill on this medication today. Will send to pharmacy.   3.  Emphysema.  This was noted on recent imaging.  [place] inhaler just added from pulmonology.  4. Constipation: Patient with a refill on lactulose. This has worked well in the past for him.  5. Auscultated abnormal heart rhythm/tachycardia:  Stat EKG. Results were unrevealing.  Interval History: Patient presents to the cancer center for continued monitoring after receiving palliative systemic immunotherapy with Tecentriq which began in August 2018.   Current Status: Patient is here for follow-up prior to Tecentriq. Patient states he feels "lousy". Patient states approximately 3-4 days ago began to feel fatigued, no energy, lack of appetite, cough with sputum production consisting of yellow/green. He denied any fevers, chills or night sweats. He does admit to some audible wheezing. He complains of chronic pain in his legs hip and back. He states he has not missed a dose of his Eliquis and he needs a refill on this medication. He denies any nausea or vomiting. He continues to cough up some blood but this is less than before. He has chronic shortness of breath. He was seen by pulmonologist and a new inhaler ([place]) was added to his medication regimen. This seems to be helping.     Adenocarcinoma of right lung ([place])   07/01/2016 PET scan    6.3 x 3.7 cm right lower lobe mass, suspicious for primary bronchogenic neoplasm, as described above. Hypermetabolic thoracic nodal metastases, as above. Additional right perihilar hypermetabolism, indeterminate. Associated right middle lobe atelectasis/collapse.      07/19/2016 Procedure    Bronchoscopy with brushings and biopsies and endobronchial ultrasound with mediastinal lymph node aspirations by Dr. Roxan Hockey      07/21/2016 Pathology Results    Lung, biopsy, Right Middle Lobe - LUNG TISSUE WITH SQUAMOUS METAPLASIA. - NO MALIGNANCY IDENTIFIED.      07/21/2016  Pathology Results    FINE NEEDLE ASPIRATION, ENDOSCOPIC (A) LEVEL 7 (SPECIMEN 1 OF 3, COLLECTED ON 07/19/16): MALIGNANT CELLS CONSISTENT WITH ADENOCARCINOMA.      07/21/2016 Pathology Results    FINE NEEDLE ASPIRATION, EBUS, 4R, B (SPECIMEN 2 OF 3, COLLECTED 07/19/16): MALIGNANT CELLS CONSISTENT WITH ADENOCARCINOMA.       07/21/2016 Pathology Results    FINE NEEDLE ASPIRATION, EBUS, BRUSHING, RIGHT MIDDLE LOBE, D (SPECIMEN 3 OF 3, COLLECTED 07/19/16): MALIGNANT CELLS CONSISTENT WITH ADENOCARCINOMA.      07/22/2016 Imaging    MRI brain- No evidence of intracranial metastases.      08/17/2016 - 09/20/2016 Chemotherapy    The patient had palonosetron (ALOXI) injection 0.25 mg, 0.25 mg, Intravenous,  Once, 1 of 1 cycle  CISplatin (PLATINOL) 123 mg in sodium chloride 0.9 % 500 mL chemo infusion, 50 mg/m2 = 123 mg, Intravenous,  Once, 1 of 1 cycle  etoposide (VEPESID) 120 mg in sodium chloride 0.9 % 500 mL chemo infusion, 50 mg/m2 = 120 mg, Intravenous,  Once, 1 of 1 cycle  fosaprepitant (EMEND) 150 mg, dexamethasone (DECADRON) 12 mg in sodium chloride 0.9 % 145 mL IVPB, , Intravenous,  Once, 1 of 1 cycle  ondansetron (ZOFRAN) 8 mg in sodium chloride 0.9 % 50 mL IVPB, , Intravenous,  Once, 2 of 6 cycles  for chemotherapy treatment.        08/17/2016 -  Radiation Therapy         11/15/2016 Imaging    CT CAP- Interval response to therapy. The previously demonstrated mediastinal and right hilar adenopathy and resulting right middle lobe atelectasis have all improved. 2. No discrete residual lung masses are identified. There is new multifocal ground-glass opacity within the right lower lobe, and to a lesser extent in the left upper lobe. These are probably inflammatory/treatment related. 3. No evidence of abdominopelvic metastatic disease. Stable prominent lymph nodes in the upper abdomen, likely reactive. 4. Decompressed mid SVC without specific signs of SVC occlusion.      12/02/2016 -  Chemotherapy    Imfinzi (durvalumab) immunotherapy every 2 weeks x up to 1 year      01/03/2017 Procedure    EGD by Dr. [name], colonoscopy aborted as "patient forgot to take other half of preparation). Esophagitis. likely radiation-induced ?Dilated.  Erythematous mucosa in the stomach. Biopsied. Normal duodenal bulb and  second portion of the duodenum.      02/14/2017 Imaging    CT chest- 1. Development of a small to moderate right-sided pleural effusion with minimal loculation anteriorly. 2. Worsened right-sided aeration with right middle and lower lobe consolidation. As this has a geographic distribution, this could be radiation induced or represent infection. Depending on clinical concern of progressive disease, thoracentesis and/or PET may be informative. 3. Development of mild right paratracheal adenopathy, most likely reactive. Recommend attention on follow-up. 4. Subtle findings which are highly suspicious for mild cirrhosis. Upper abdominal adenopathy is similar and likely reactive. 5. Persistent right lower and improved left upper lobe ground-glass opacities are favored to be infectious or inflammatory.      06/01/2017 Imaging    CT chest  IMPRESSION: 1. Evolving radiation changes in the medial right hemithorax. 2. Stable moderate size right pleural effusion without definite nodular components. 3. No signs of local recurrence or progressive metastatic disease. No residual enlarged thoracic lymph nodes. 4. Stable appearance of the visualized upper abdomen with probable cirrhosis and reactive adenopathy in the porta hepatis.      07/15/2017 Procedure  Therapeutic thoracentesis performed today; 1.3 L removed.  Fluid sent for cytology.       07/15/2017 Pathology Results    Pleural fluid NEGATIVE for malignancy by cytology.       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 Paramediastinal radiation change and loculated right pleural effusion appear unchanged from 07/14/2017. 2. Borderline prevascular lymph node within the anterior mediastinum is mildly increased in size from previous exam. 3. There is a new 5 mm lung nodule within the left lower lobe. Nonspecific in appearance. Attention on  follow-up imaging advise. Other small nonspecific nodules in the left lung are stable. 4. Morphologic features of the liver compatible with cirrhosis. Enlarged upper abdominal lymph nodes are nonspecific and likely reactive. 5. Stable indeterminate low-attenuation focus in the posterior right liver dome. Previously characterized on MRI from 07/29/2017 as reflecting post treatment changes from external beam radiation to the lung. 6.  Emphysema (ICD10-J43.9).         Problem List Patient Active Problem List   Diagnosis Date Noted  . Taking multiple medications for chronic disease [R69] 03/08/2017  . 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 Loss of weight [R63.4] 12/10/2016  . Encounter for screening colonoscopy [Z12.11] 12/10/2016  . Esophageal dysphagia [R13.10] 12/10/2016  . Abdominal pain, epigastric [R10.13] 12/10/2016  . Adenocarcinoma of right lung ([place]) [C34.91] 07/23/2016  . GERD [K21.9] 01/13/2009  . ACTINIC KERATOSIS [L57.0] 01/13/2009  . ANXIETY [F41.1] 12/27/2008  . DEPRESSION [F32.9] 12/27/2008  . ASTHMA [J45.909] 12/27/2008  . OSTEOARTHRITIS [M19.90] 12/27/2008  . LOW BACK PAIN [M54.5] 12/27/2008    Past Medical History Past Medical History:  Diagnosis Date  . Arnold-Chiari syndrome ([place])   . Arthritis   . Asthma   . Chronic back pain   . Depression   . Dyspnea    with exertion   . GERD (gastroesophageal reflux disease)   . Hypertension   . Seasonal allergies   . Spinal stenosis of lumbar region   . 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 Wheezing     Past Surgical History Past Surgical History:  Procedure Laterality Date  . BACK SURGERY     5 total  . BIOPSY  01/03/2017   Procedure: BIOPSY;  Surgeon: Daneil Dolin, MD;  [facility];  Service: Endoscopy;;  gastric  . COLONOSCOPY WITH PROPOFOL N/A 04/04/2017   Procedure: COLONOSCOPY WITH PROPOFOL;  Surgeon: Daneil Dolin, MD;  [facility];   Service: Endoscopy;  Laterality: N/A;  8:45am  . ESOPHAGOGASTRODUODENOSCOPY (EGD) WITH PROPOFOL N/A 01/03/2017   Procedure: ESOPHAGOGASTRODUODENOSCOPY (EGD) WITH PROPOFOL;  Surgeon: Daneil Dolin, MD;  [facility];  Service: Endoscopy;  Laterality: N/A;  . Venia Minks DILATION N/A 01/03/2017   Procedure: Venia Minks DILATION;  Surgeon: Daneil Dolin, MD;  [facility];  Service: Endoscopy;  Laterality: N/A;  . MULTIPLE EXTRACTIONS WITH ALVEOLOPLASTY N/A 07/08/2014   Procedure: MULTIPLE EXTRACION WITH ALVEOLOPLASTY with EXCISION LESION RIGHT SIDE OF TONGUE;  Surgeon: Gae Bon, DDS;  Location: [place];  Service: Oral Surgery;  Laterality: N/A;  . PORTACATH PLACEMENT Right 07/30/2016   Procedure: INSERTION PORT-A-CATH;  Surgeon: Vickie Epley, MD;  Location: [facility];  Service: Vascular;  Laterality: Right;  [name] VIDEO BRONCHOSCOPY WITH ENDOBRONCHIAL ULTRASOUND N/A 07/19/2016   Procedure: VIDEO BRONCHOSCOPY WITH ENDOBRONCHIAL ULTRASOUND;  Surgeon: [name],  MD;  Location: [facility];  Service: Thoracic;  Laterality: N/A;    Family History Family History  Problem Relation Age of Onset  . Cancer Mother        breast cancer  . Heart failure Brother   . Colon cancer Neg Hx        not sure, ?grandfather and/or uncle     Social History  reports that he has been smoking cigarettes.  He has a 9.50 pack-year smoking history. He has quit using smokeless tobacco. His smokeless tobacco use included chew. He reports that he drinks about 1.2 oz of alcohol per week. He reports that he uses drugs. Drug: Marijuana. Frequency: 7.00 times per week.  Medications  Current Outpatient Medications:  .  albuterol (PROVENTIL HFA;VENTOLIN HFA) 108 (90 Base) MCG/ACT inhaler, Inhale 2 puffs into the lungs every 6 (six) hours as needed for wheezing., Disp: 1 Inhaler, Rfl: 0 .  albuterol (PROVENTIL HFA;VENTOLIN HFA) 108 (90 Base) MCG/ACT inhaler, , Disp: , Rfl:  .  ALPRAZolam (XANAX) 1 MG tablet,  Take 1 mg by mouth 2 (two) times daily as needed for anxiety or sleep (takes 1 tablet and bedtime for sleep and 1 dose during the day only if needed). , Disp: , Rfl:  .  amLODipine (NORVASC) 5 MG tablet, Take 5 mg by mouth daily. , Disp: , Rfl:  .  apixaban (ELIQUIS) 5 MG TABS tablet, Take 1 tablet (5 mg total) by mouth 2 (two) times daily., Disp: 60 tablet, Rfl: 2 .  Artificial Tear Solution (SOOTHE XP) SOLN, Apply 2 drops to eye daily., Disp: , Rfl:  .  Atezolizumab (TECENTRIQ IV), Inject into the vein. Every 3 weeks, Disp: , Rfl:  .  budesonide-formoterol (SYMBICORT) 160-4.5 MCG/ACT inhaler, Inhale 2 puffs into the lungs daily. , Disp: , Rfl:  .  ELIQUIS STARTER PACK (ELIQUIS STARTER PACK) 5 MG TABS, Take as directed on package: start with two-25m tablets twice daily for 7 days. On day 8, switch to one-562mtablet twice daily., Disp: 1 each, Rfl: 0 .  escitalopram (LEXAPRO) 20 MG tablet, Take 20 mg by mouth daily. , Disp: , Rfl:  .  gabapentin (NEURONTIN) 600 MG tablet, Take 600 mg by mouth 2 (two) times daily. , Disp: , Rfl:  .  guaiFENesin (MUCINEX) 600 MG 12 hr tablet, Take 600 mg by mouth 2 (two) times daily as needed (for congestion.)., Disp: , Rfl:  .  hydrochlorothiazide (MICROZIDE) 12.5 MG capsule, , Disp: , Rfl:  .  lactulose (CHRONULAC) 10 GM/15ML solution, Take 15 mLs (10 g total) by mouth 3 (three) times daily as needed for mild constipation or moderate constipation., Disp: 240 mL, Rfl: 0 .  levofloxacin (LEVAQUIN) 500 MG tablet, Take 1 tablet (500 mg total) by mouth daily., Disp: 10 tablet, Rfl: 0 .  lidocaine (XYLOCAINE) 2 % solution, Use as directed 20 mLs in the mouth or throat every 6 (six) hours as needed (for mouth pain (used prior to eating when needed))., Disp: , Rfl:  .  lidocaine-prilocaine (EMLA) cream, Apply a quarter size amount to port site 1 hour prior to chemo. Do not rub in. Cover with plastic wrap. (Patient not taking: Reported on 01/02/2018), Disp: 30 g, Rfl: 2 .   methadone (DOLOPHINE) 10 MG tablet, Take 20 mg by mouth 5 (five) times daily. Takes 2 tablets 5 times daily, Disp: , Rfl:  .  mirtazapine (REMERON) 15 MG tablet, Take 1 tablet by mouth at bedtime., Disp: , Rfl:  .  Multiple Vitamin (MULTIVITAMIN WITH MINERALS) TABS tablet, Take 1 tablet by mouth daily. Centrum, Disp: , Rfl:  .  ondansetron (ZOFRAN) 8 MG tablet, TAKE 1 TABLET BY MOUTH EVERY 8 HOURS AS NEEDED FOR NAUSEA AND VOMITING., Disp: 30 tablet, Rfl: 2 .  pantoprazole (PROTONIX) 40 MG tablet, TAKE (1) TABLET BY MOUTH TWICE DAILY., Disp: 60 tablet, Rfl: 3 .  predniSONE (STERAPRED UNI-PAK 21 TAB) 10 MG (21) TBPK tablet, Take as prescribed., Disp: 21 tablet, Rfl: 0 .  predniSONE (STERAPRED UNI-PAK 21 TAB) 10 MG (21) TBPK tablet, Take as directed., Disp: 21 tablet, Rfl: 0 .  prochlorperazine (COMPAZINE) 10 MG tablet, TAKE 1 TABLET EVERY 6 HOURS AS NEEDED FOR NAUSEA AND VOMITING., Disp: 60 tablet, Rfl: 3 .  tiZANidine (ZANAFLEX) 4 MG tablet, Take 4 mg by mouth every 8 (eight) hours as needed for muscle spasms. , Disp: , Rfl:  .  TRELEGY ELLIPTA 100-62.5-25 MCG/INH AEPB, , Disp: , Rfl:  .  varenicline (CHANTIX) 0.5 MG tablet, TAKE (2) TABLETS BY MOUTH TWICE DAILY., Disp: 112 tablet, Rfl: 0 No current facility-administered medications for this visit.   Facility-Administered Medications Ordered in Other Visits:  .  0.9 %  sodium chloride infusion, , Intravenous, Continuous, Kefalas, Manon Hilding, PA-C, Last Rate: 10 mL/hr at 01/13/17 1330  Allergies Demeclocycline and Tetracyclines & related  Review of Systems Review of Systems  Constitutional: Positive for appetite change and fatigue. Negative for chills, diaphoresis and fever.  HENT:  Negative.   Eyes: Negative.   Respiratory: Positive for cough, hemoptysis, shortness of breath and wheezing. Negative for chest tightness.   Cardiovascular: Negative.  Negative for leg swelling.  Gastrointestinal: Negative for abdominal pain, blood in stool,  constipation, diarrhea and nausea.  Endocrine: Negative.   Genitourinary: Negative.    Musculoskeletal: Positive for back pain (Chronic). Negative for gait problem.  Skin: Negative.   Neurological: Negative.  Negative for dizziness and gait problem.  Hematological: Negative.   Psychiatric/Behavioral: Negative.    ROS as per HPI otherwise 12 point ROS is negative except for neuropathy, shortness of breath, constipation, trouble swallowing.   Physical Exam  Vitals Wt Readings from Last 3 Encounters:  01/23/18 224 lb (101.6 kg)  01/02/18 221 lb (100.2 kg)  01/02/18 221 lb (100.2 kg)   Temp Readings from Last 3 Encounters:  01/23/18 98.7 F (37.1 C) (Oral)  01/23/18 98.5 F (36.9 C) (Oral)  01/02/18 98 F (36.7 C) (Oral)   BP Readings from Last 3 Encounters:  01/23/18 95/66  01/23/18 100/61  01/02/18 129/88   Pulse Readings from Last 3 Encounters:  01/23/18 98  01/23/18 (!) 106  01/02/18 97    Constitutional: Well-developed, well-nourished, and in no distress.   HENT:  Head: Normocephalic and atraumatic.  Mouth/Throat: No oropharyngeal exudate. Mucosa moist. Eyes: Pupils are equal, round, and reactive to light. Conjunctivae are normal. No scleral icterus.  Neck: Normal range of motion. Neck supple. No JVD present.  Cardiovascular: Tachycardia, abnormal rhythm with normal heart sounds.  Exam reveals no gallop and no friction rub.   No murmur heard. Pulmonary/Chest: LUL and RUL rhonchi and wheezing auscultated. Abdominal: Soft. Bowel sounds are normal. No distension. There is no tenderness. There is no guarding.  Musculoskeletal: No edema or tenderness.  Lymphadenopathy: No cervical or supraclavicular adenopathy.  Neurological: Alert and oriented to person, place, and time. No cranial nerve deficit.  Skin: Skin is warm and dry. No rash noted. No erythema. He is pale.  Psychiatric: Affect and judgment normal.  Labs Appointment on 01/23/2018  Component Date Value Ref  Range Status  . Sodium 01/23/2018 132* 135 - 145 mmol/L Final  . Potassium 01/23/2018 3.7  3.5 - 5.1 mmol/L Final  . Chloride 01/23/2018 90* 101 - 111 mmol/L Final  . CO2 01/23/2018 30  22 - 32 mmol/L Final  . Glucose, Bld 01/23/2018 159* 65 - 99 mg/dL Final  . BUN 01/23/2018 8  6 - 20 mg/dL Final  . Creatinine, Ser 01/23/2018 0.77  0.61 - 1.24 mg/dL Final  . Calcium 01/23/2018 8.3* 8.9 - 10.3 mg/dL Final  . Total Protein 01/23/2018 6.9  6.5 - 8.1 g/dL Final  . Albumin 01/23/2018 2.1* 3.5 - 5.0 g/dL Final  . AST 01/23/2018 60* 15 - 41 U/L Final  . ALT 01/23/2018 38  17 - 63 U/L Final  . Alkaline Phosphatase 01/23/2018 241* 38 - 126 U/L Final  . Total Bilirubin 01/23/2018 1.6* 0.3 - 1.2 mg/dL Final  . GFR calc non Af Amer 01/23/2018 >60  >60 mL/min Final  . GFR calc Af Amer 01/23/2018 >60  >60 mL/min Final   Comment: (NOTE) The eGFR has been calculated using the CKD EPI equation. This calculation has not been validated in all clinical situations. eGFR's persistently <60 mL/min signify possible Chronic Kidney Disease.   Georgiann Hahn gap 01/23/2018 12  5 - 15 Final   Performed at [facility], [address], [place], [place] [address]  . WBC 01/23/2018 17.6* 4.0 - 10.5 K/uL Final  . RBC 01/23/2018 4.22  4.22 - 5.81 MIL/uL Final  . Hemoglobin 01/23/2018 13.1  13.0 - 17.0 g/dL Final  . HCT 01/23/2018 39.9  39.0 - 52.0 % Final  . MCV 01/23/2018 94.5  78.0 - 100.0 fL Final  . MCH 01/23/2018 31.0  26.0 - 34.0 pg Final  . MCHC 01/23/2018 32.8  30.0 - 36.0 g/dL Final  . RDW 01/23/2018 14.5  11.5 - 15.5 % Final  . Platelets 01/23/2018 275  150 - 400 K/uL Final  . Neutrophils Relative % 01/23/2018 82  % Final  . Neutro Abs 01/23/2018 14.4* 1.7 - 7.7 K/uL Final  . Lymphocytes Relative 01/23/2018 9  % Final  . Lymphs Abs 01/23/2018 1.5  0.7 - 4.0 K/uL Final  . Monocytes Relative 01/23/2018 9  % Final  . Monocytes Absolute 01/23/2018 1.6* 0.1 - 1.0 K/uL Final  . Eosinophils Relative 01/23/2018 0   % Final  . Eosinophils Absolute 01/23/2018 0.0  0.0 - 0.7 K/uL Final  . Basophils Relative 01/23/2018 0  % Final  . Basophils Absolute 01/23/2018 0.0  0.0 - 0.1 K/uL Final   Performed at [facility], [address] [place], [place] [address]  . LDH 01/23/2018 148  98 - 192 U/L Final   Performed at [facility], [address], [place], [place] [address]     Pathology Orders Placed This Encounter  Procedures  . DG Chest 2 View    Standing Status:   Future    Number of Occurrences:   1    Standing Expiration Date:   01/23/2019    Order Specific Question:   Reason for Exam (SYMPTOM  OR DIAGNOSIS REQUIRED)    Answer:   Wheezing, Shortness of breath    Order Specific Question:   Preferred imaging location?    Answer:   [facility]    Order Specific Question:   Radiology Contrast Protocol - do NOT remove file path    Answer:   [email or web address]  CT angio done January 2019.  IMPRESSION: Small pulmonary emboli in LEFT lower lobe.  Emphysematous changes with scattered patchy ground-glass infiltrates in LEFT lung favoring infection.  Persistent RIGHT pleural effusion with post radiation therapy changes of the RIGHT lung.  Patchy areas of airspace opacification within RIGHT lung could represent infection or developing tumor recommend attention on follow-up imaging.  Small pericardial effusion, minimally increased.  Increased mediastinal and RIGHT hilar adenopathy.  Question mild hepatosplenomegaly.  Probable esophagitis, suspect related to radiation therapy though can be seen with reflux and infection.  Findings called to [name] NP on 12/26/2017 at 1307 hours.  Aortic Atherosclerosis (ICD10-I70.0) and Emphysema (ICD10-J43.9).  CT abdomen and pelvis done 12/28/2016:  IMPRESSION: 1. No findings suspicious for metastatic disease in the abdomen or pelvis. 2. Stable nonspecific mild porta hepatis, portacaval and  aortocaval adenopathy, more likely reactive. 3. Hepatic cirrhosis. Previously described bandlike post treatment change at the right liver dome has essentially resolved. No liver masses. 4. Ground-glass and subsolid nodular opacities at the lung bases appear decreased since 12/26/2017 chest CT, suggesting improving inflammatory opacities. Recommend short-term chest CT surveillance. 5. Stable chronic small to moderate right pleural effusion with smooth right pleural thickening.  Greater than 50% was spent in counseling and coordination of care with this patient including but not limited to discussion of the relevant topics above (See A&P) including, but not limited to diagnosis and management of acute and chronic medical conditions.   [name], NP 01/23/2018 4:20 PM

## 2018-01-23 NOTE — Patient Instructions (Signed)
[place] at [facility]  Discharge Instructions:  You were seen by [name], NP, today. _______________________________________________________________  Thank you for choosing [place] at [facility] to provide your oncology and hematology care.  To afford each patient quality time with our providers, please arrive at least 15 minutes before your scheduled appointment.  You need to re-schedule your appointment if you arrive 10 or more minutes late.  We strive to give you quality time with our providers, and arriving late affects you and other patients whose appointments are after yours.  Also, if you no show three or more times for appointments you may be dismissed from the clinic.  Again, thank you for choosing [facility] at [place] hope is that these requests will allow you access to exceptional care and in a timely manner. _______________________________________________________________  If you have questions after your visit, please contact our office at [phone] between the hours of 8:30 a.m. and 5:00 p.m. Voicemails left after 4:30 p.m. will not be returned until the following business day. _______________________________________________________________  For prescription refill requests, have your pharmacy contact our office. _______________________________________________________________  Recommendations made by the consultant and any test results will be sent to your referring physician. _______________________________________________________________

## 2018-01-23 NOTE — Addendum Note (Signed)
Addended by: Anselmo Rod on: 01/23/2018 12:56 PM   Modules accepted: Orders

## 2018-01-23 NOTE — Addendum Note (Signed)
Addended by: [name] E on: 01/23/2018 10:43 AM   Modules accepted: Orders

## 2018-01-23 NOTE — Progress Notes (Signed)
Tx deferred x 1 week per Lorretta Harp, NP.  Pt tolerated IVF hydration well.  Alert, in no distress.  VSS.  Discharged ambulatory.

## 2018-01-28 ENCOUNTER — Inpatient Hospital Stay (HOSPITAL_COMMUNITY)
Admission: EM | Admit: 2018-01-28 | Discharge: 2018-02-01 | DRG: 193 | Disposition: A | Discharge status: Home / Self Care | Payer: Medicare Other | Attending: Internal Medicine | Admitting: Internal Medicine

## 2018-01-28 ENCOUNTER — Encounter (HOSPITAL_COMMUNITY): Payer: Self-pay

## 2018-01-28 ENCOUNTER — Other Ambulatory Visit: Payer: Self-pay

## 2018-01-28 ENCOUNTER — Emergency Department (HOSPITAL_COMMUNITY): Payer: Medicare Other

## 2018-01-28 DIAGNOSIS — F419 Anxiety disorder, unspecified: Secondary | ICD-10-CM | POA: Diagnosis present

## 2018-01-28 DIAGNOSIS — F1290 Cannabis use, unspecified, uncomplicated: Secondary | ICD-10-CM | POA: Diagnosis present

## 2018-01-28 DIAGNOSIS — F17210 Nicotine dependence, cigarettes, uncomplicated: Secondary | ICD-10-CM | POA: Diagnosis present

## 2018-01-28 DIAGNOSIS — K219 Gastro-esophageal reflux disease without esophagitis: Secondary | ICD-10-CM | POA: Diagnosis present

## 2018-01-28 DIAGNOSIS — Z923 Personal history of irradiation: Secondary | ICD-10-CM

## 2018-01-28 DIAGNOSIS — F329 Major depressive disorder, single episode, unspecified: Secondary | ICD-10-CM | POA: Diagnosis present

## 2018-01-28 DIAGNOSIS — Z881 Allergy status to other antibiotic agents status: Secondary | ICD-10-CM

## 2018-01-28 DIAGNOSIS — M1990 Unspecified osteoarthritis, unspecified site: Secondary | ICD-10-CM | POA: Diagnosis present

## 2018-01-28 DIAGNOSIS — J449 Chronic obstructive pulmonary disease, unspecified: Secondary | ICD-10-CM | POA: Diagnosis present

## 2018-01-28 DIAGNOSIS — Z86711 Personal history of pulmonary embolism: Secondary | ICD-10-CM

## 2018-01-28 DIAGNOSIS — Z7952 Long term (current) use of systemic steroids: Secondary | ICD-10-CM

## 2018-01-28 DIAGNOSIS — C3491 Malignant neoplasm of unspecified part of right bronchus or lung: Secondary | ICD-10-CM | POA: Diagnosis present

## 2018-01-28 DIAGNOSIS — R0602 Shortness of breath: Secondary | ICD-10-CM | POA: Diagnosis not present

## 2018-01-28 DIAGNOSIS — G894 Chronic pain syndrome: Secondary | ICD-10-CM | POA: Diagnosis present

## 2018-01-28 DIAGNOSIS — Q0700 Arnold-Chiari syndrome without spina bifida or hydrocephalus: Secondary | ICD-10-CM

## 2018-01-28 DIAGNOSIS — J181 Lobar pneumonia, unspecified organism: Secondary | ICD-10-CM | POA: Diagnosis not present

## 2018-01-28 DIAGNOSIS — F411 Generalized anxiety disorder: Secondary | ICD-10-CM | POA: Diagnosis present

## 2018-01-28 DIAGNOSIS — Z8249 Family history of ischemic heart disease and other diseases of the circulatory system: Secondary | ICD-10-CM | POA: Diagnosis not present

## 2018-01-28 DIAGNOSIS — J9601 Acute respiratory failure with hypoxia: Secondary | ICD-10-CM | POA: Diagnosis present

## 2018-01-28 DIAGNOSIS — J189 Pneumonia, unspecified organism: Secondary | ICD-10-CM | POA: Diagnosis present

## 2018-01-28 DIAGNOSIS — Z720 Tobacco use: Secondary | ICD-10-CM | POA: Diagnosis not present

## 2018-01-28 DIAGNOSIS — Z79899 Other long term (current) drug therapy: Secondary | ICD-10-CM | POA: Diagnosis not present

## 2018-01-28 DIAGNOSIS — Z8701 Personal history of pneumonia (recurrent): Secondary | ICD-10-CM

## 2018-01-28 DIAGNOSIS — I10 Essential (primary) hypertension: Secondary | ICD-10-CM | POA: Diagnosis not present

## 2018-01-28 DIAGNOSIS — Y95 Nosocomial condition: Secondary | ICD-10-CM | POA: Diagnosis present

## 2018-01-28 DIAGNOSIS — Z7901 Long term (current) use of anticoagulants: Secondary | ICD-10-CM

## 2018-01-28 DIAGNOSIS — A419 Sepsis, unspecified organism: Secondary | ICD-10-CM | POA: Diagnosis not present

## 2018-01-28 DIAGNOSIS — J440 Chronic obstructive pulmonary disease with acute lower respiratory infection: Secondary | ICD-10-CM | POA: Diagnosis present

## 2018-01-28 DIAGNOSIS — Z803 Family history of malignant neoplasm of breast: Secondary | ICD-10-CM | POA: Diagnosis not present

## 2018-01-28 DIAGNOSIS — Z7951 Long term (current) use of inhaled steroids: Secondary | ICD-10-CM | POA: Diagnosis not present

## 2018-01-28 HISTORY — DX: Other pulmonary embolism without acute cor pulmonale: I26.99

## 2018-01-28 HISTORY — DX: Chronic obstructive pulmonary disease, unspecified: J44.9

## 2018-01-28 HISTORY — DX: Pneumonia, unspecified organism: J18.9

## 2018-01-28 LAB — COMPREHENSIVE METABOLIC PANEL
ALT: 71 U/L — AB (ref 17–63)
AST: 57 U/L — AB (ref 15–41)
Albumin: 2.4 g/dL — ABNORMAL LOW (ref 3.5–5.0)
Alkaline Phosphatase: 264 U/L — ABNORMAL HIGH (ref 38–126)
Anion gap: 14 (ref 5–15)
BILIRUBIN TOTAL: 0.9 mg/dL (ref 0.3–1.2)
BUN: 8 mg/dL (ref 6–20)
CALCIUM: 8.6 mg/dL — AB (ref 8.9–10.3)
CO2: 25 mmol/L (ref 22–32)
CREATININE: 0.6 mg/dL — AB (ref 0.61–1.24)
Chloride: 96 mmol/L — ABNORMAL LOW (ref 101–111)
Glucose, Bld: 130 mg/dL — ABNORMAL HIGH (ref 65–99)
Potassium: 3.5 mmol/L (ref 3.5–5.1)
Sodium: 135 mmol/L (ref 135–145)
Total Protein: 7.3 g/dL (ref 6.5–8.1)

## 2018-01-28 LAB — CBC WITH DIFFERENTIAL/PLATELET
Basophils Absolute: 0 10*3/uL (ref 0.0–0.1)
Basophils Relative: 0 %
EOS ABS: 0 10*3/uL (ref 0.0–0.7)
EOS PCT: 0 %
HCT: 41.5 % (ref 39.0–52.0)
Hemoglobin: 14.1 g/dL (ref 13.0–17.0)
LYMPHS ABS: 1.5 10*3/uL (ref 0.7–4.0)
Lymphocytes Relative: 9 %
MCH: 30.7 pg (ref 26.0–34.0)
MCHC: 34 g/dL (ref 30.0–36.0)
MCV: 90.2 fL (ref 78.0–100.0)
Monocytes Absolute: 1.3 10*3/uL — ABNORMAL HIGH (ref 0.1–1.0)
Monocytes Relative: 8 %
NEUTROS PCT: 83 %
Neutro Abs: 13.5 10*3/uL — ABNORMAL HIGH (ref 1.7–7.7)
PLATELETS: 328 10*3/uL (ref 150–400)
RBC: 4.6 MIL/uL (ref 4.22–5.81)
RDW: 15.5 % (ref 11.5–15.5)
WBC: 16.3 10*3/uL — AB (ref 4.0–10.5)

## 2018-01-28 LAB — INFLUENZA PANEL BY PCR (TYPE A & B)
INFLAPCR: NEGATIVE
Influenza B By PCR: NEGATIVE

## 2018-01-28 LAB — I-STAT CG4 LACTIC ACID, ED: Lactic Acid, Venous: 1.64 mmol/L (ref 0.5–1.9)

## 2018-01-28 MED ORDER — LORAZEPAM 2 MG/ML IJ SOLN
1.0000 mg | Freq: Once | INTRAMUSCULAR | Status: AC
  Administered 2018-01-28: 1 mg via INTRAVENOUS
  Filled 2018-01-28: qty 1

## 2018-01-28 MED ORDER — ALPRAZOLAM 1 MG PO TABS
1.0000 mg | ORAL_TABLET | Freq: Two times a day (BID) | ORAL | Status: DC | PRN
  Administered 2018-01-29 – 2018-01-31 (×4): 1 mg via ORAL
  Filled 2018-01-28 (×4): qty 1

## 2018-01-28 MED ORDER — VANCOMYCIN HCL IN DEXTROSE 1-5 GM/200ML-% IV SOLN
1000.0000 mg | Freq: Once | INTRAVENOUS | Status: AC
  Administered 2018-01-28: 1000 mg via INTRAVENOUS
  Filled 2018-01-28: qty 200

## 2018-01-28 MED ORDER — PANTOPRAZOLE SODIUM 40 MG PO TBEC
40.0000 mg | DELAYED_RELEASE_TABLET | Freq: Every day | ORAL | Status: DC
  Administered 2018-01-29 – 2018-02-01 (×4): 40 mg via ORAL
  Filled 2018-01-28 (×4): qty 1

## 2018-01-28 MED ORDER — IOPAMIDOL (ISOVUE-370) INJECTION 76%
100.0000 mL | Freq: Once | INTRAVENOUS | Status: AC | PRN
  Administered 2018-01-28: 100 mL via INTRAVENOUS

## 2018-01-28 MED ORDER — GABAPENTIN 300 MG PO CAPS
600.0000 mg | ORAL_CAPSULE | Freq: Two times a day (BID) | ORAL | Status: DC
  Administered 2018-01-28 – 2018-02-01 (×8): 600 mg via ORAL
  Filled 2018-01-28 (×4): qty 2
  Filled 2018-01-28: qty 6
  Filled 2018-01-28: qty 2
  Filled 2018-01-28: qty 6
  Filled 2018-01-28: qty 2

## 2018-01-28 MED ORDER — PROCHLORPERAZINE MALEATE 5 MG PO TABS: 5.0000 mg | ORAL_TABLET | Freq: Four times a day (QID) | ORAL | Status: DC | PRN

## 2018-01-28 MED ORDER — VARENICLINE TARTRATE 1 MG PO TABS
1.0000 mg | ORAL_TABLET | Freq: Two times a day (BID) | ORAL | Status: DC
  Administered 2018-01-29 – 2018-02-01 (×8): 1 mg via ORAL
  Filled 2018-01-28 (×12): qty 1

## 2018-01-28 MED ORDER — ONDANSETRON HCL 4 MG PO TABS: 4.0000 mg | ORAL_TABLET | Freq: Four times a day (QID) | ORAL | Status: DC | PRN

## 2018-01-28 MED ORDER — VANCOMYCIN HCL 10 G IV SOLR
1500.0000 mg | Freq: Once | INTRAVENOUS | Status: DC
  Filled 2018-01-28: qty 1500

## 2018-01-28 MED ORDER — LACTULOSE 10 GM/15ML PO SOLN: 10.0000 g | Freq: Three times a day (TID) | ORAL | Status: DC | PRN

## 2018-01-28 MED ORDER — GI COCKTAIL ~~LOC~~
30.0000 mL | Freq: Three times a day (TID) | ORAL | Status: DC | PRN
  Administered 2018-01-30: 30 mL via ORAL
  Filled 2018-01-28: qty 30

## 2018-01-28 MED ORDER — GUAIFENESIN ER 600 MG PO TB12: 600.0000 mg | ORAL_TABLET | Freq: Two times a day (BID) | ORAL | Status: DC | PRN

## 2018-01-28 MED ORDER — TIZANIDINE HCL 4 MG PO TABS
4.0000 mg | ORAL_TABLET | Freq: Three times a day (TID) | ORAL | Status: DC | PRN
  Administered 2018-01-29 – 2018-01-30 (×2): 4 mg via ORAL
  Filled 2018-01-28 (×2): qty 1

## 2018-01-28 MED ORDER — ONDANSETRON HCL 4 MG/2ML IJ SOLN: 4.0000 mg | Freq: Three times a day (TID) | INTRAMUSCULAR | Status: DC | PRN

## 2018-01-28 MED ORDER — IPRATROPIUM-ALBUTEROL 0.5-2.5 (3) MG/3ML IN SOLN
3.0000 mL | Freq: Once | RESPIRATORY_TRACT | Status: AC
  Administered 2018-01-28: 3 mL via RESPIRATORY_TRACT
  Filled 2018-01-28: qty 3

## 2018-01-28 MED ORDER — MIRTAZAPINE 30 MG PO TABS
ORAL_TABLET | ORAL | Status: AC
  Filled 2018-01-28: qty 1

## 2018-01-28 MED ORDER — IPRATROPIUM-ALBUTEROL 0.5-2.5 (3) MG/3ML IN SOLN
3.0000 mL | RESPIRATORY_TRACT | Status: DC | PRN
  Administered 2018-01-29: 3 mL via RESPIRATORY_TRACT
  Filled 2018-01-28: qty 3

## 2018-01-28 MED ORDER — MIRTAZAPINE 15 MG PO TABS
15.0000 mg | ORAL_TABLET | Freq: Every day | ORAL | Status: DC
  Administered 2018-01-29 – 2018-01-31 (×4): 15 mg via ORAL
  Filled 2018-01-28 (×5): qty 1

## 2018-01-28 MED ORDER — ESCITALOPRAM OXALATE 10 MG PO TABS
20.0000 mg | ORAL_TABLET | Freq: Every day | ORAL | Status: DC
  Administered 2018-01-29 – 2018-02-01 (×4): 20 mg via ORAL
  Filled 2018-01-28: qty 2
  Filled 2018-01-28: qty 1
  Filled 2018-01-28: qty 2
  Filled 2018-01-28: qty 1
  Filled 2018-01-28 (×2): qty 2

## 2018-01-28 MED ORDER — METHADONE HCL 10 MG PO TABS
20.0000 mg | ORAL_TABLET | Freq: Every day | ORAL | Status: DC
  Administered 2018-01-28 – 2018-02-01 (×19): 20 mg via ORAL
  Filled 2018-01-28 (×19): qty 2

## 2018-01-28 MED ORDER — APIXABAN 5 MG PO TABS
5.0000 mg | ORAL_TABLET | Freq: Two times a day (BID) | ORAL | Status: DC
  Administered 2018-01-29 – 2018-02-01 (×7): 5 mg via ORAL
  Filled 2018-01-28 (×7): qty 1

## 2018-01-28 MED ORDER — ONDANSETRON HCL 4 MG/2ML IJ SOLN: 4.0000 mg | Freq: Four times a day (QID) | INTRAMUSCULAR | Status: DC | PRN

## 2018-01-28 MED ORDER — MOMETASONE FURO-FORMOTEROL FUM 200-5 MCG/ACT IN AERO
2.0000 | INHALATION_SPRAY | Freq: Two times a day (BID) | RESPIRATORY_TRACT | Status: DC
  Administered 2018-01-29 – 2018-02-01 (×6): 2 via RESPIRATORY_TRACT
  Filled 2018-01-28: qty 8.8

## 2018-01-28 MED ORDER — AMLODIPINE BESYLATE 5 MG PO TABS
5.0000 mg | ORAL_TABLET | Freq: Every day | ORAL | Status: DC
  Administered 2018-01-29 – 2018-02-01 (×4): 5 mg via ORAL
  Filled 2018-01-28 (×4): qty 1

## 2018-01-28 MED ORDER — SODIUM CHLORIDE 0.9 % IV SOLN
2.0000 g | Freq: Once | INTRAVENOUS | Status: AC
  Filled 2018-01-28: qty 2

## 2018-01-28 MED ORDER — SODIUM CHLORIDE 0.9 % IV SOLN
2.0000 g | Freq: Once | INTRAVENOUS | Status: AC
  Administered 2018-01-28: 2 g via INTRAVENOUS
  Filled 2018-01-28: qty 2

## 2018-01-28 MED ORDER — FAMOTIDINE 20 MG PO TABS
10.0000 mg | ORAL_TABLET | Freq: Once | ORAL | Status: AC
  Administered 2018-01-28: 10 mg via ORAL
  Filled 2018-01-28: qty 1

## 2018-01-28 MED ORDER — HYDROCHLOROTHIAZIDE 12.5 MG PO CAPS
12.5000 mg | ORAL_CAPSULE | Freq: Every day | ORAL | Status: DC
  Administered 2018-01-29 – 2018-02-01 (×4): 12.5 mg via ORAL
  Filled 2018-01-28 (×4): qty 1

## 2018-01-28 MED ORDER — SODIUM CHLORIDE 0.45 % IV SOLN
INTRAVENOUS | Status: DC
  Administered 2018-01-28 – 2018-01-31 (×4): via INTRAVENOUS

## 2018-01-28 NOTE — ED Notes (Signed)
Gina notified of need for Remeron and chantix

## 2018-01-28 NOTE — ED Notes (Signed)
Pt breathing much better now.  No longer diaphoretic.

## 2018-01-28 NOTE — ED Triage Notes (Signed)
Pt brought in by EMS due to SOB that started today. Pt is being treated for pneumonia since Monday. Pt has hx of lung cancer and PE several weeks ago. Sats 94% on room air

## 2018-01-28 NOTE — Progress Notes (Signed)
ANTIBIOTIC CONSULT NOTE-Preliminary  Pharmacy Consult for Vancomycin and Cefepime Indication: Pneumonia  Allergies  Allergen Reactions  . Demeclocycline Other (See Comments) and Swelling  . Tetracyclines & Related Swelling and Other (See Comments)         Patient Measurements: Weight: 224 lb (101.6 kg)  Vital Signs: Temp: 97.9 F (36.6 C) (02/23 1633) Temp Source: Oral (02/23 1725) BP: 144/108 (02/23 2250) Pulse Rate: 100 (02/23 2250)  Labs: Recent Labs    01/28/18 1612  WBC 16.3*  HGB 14.1  PLT 328  CREATININE 0.60*    Estimated Creatinine Clearance: 128.7 mL/min (A) (by C-G formula based on SCr of 0.6 mg/dL (L)).  No results for input(s): VANCOTROUGH, VANCOPEAK, VANCORANDOM, GENTTROUGH, GENTPEAK, GENTRANDOM, TOBRATROUGH, TOBRAPEAK, TOBRARND, AMIKACINPEAK, AMIKACINTROU, AMIKACIN in the last 72 hours.   Microbiology: No results found for this or any previous visit (from the past 720 hour(s)).  Medical History: Past Medical History:  Diagnosis Date  . Arnold-Chiari syndrome ([place])   . Arthritis   . Asthma   . Chronic back pain   . COPD (chronic obstructive pulmonary disease) ([place])   . Depression   . Dyspnea    with exertion   . GERD (gastroesophageal reflux disease)   . Hypertension   . Pneumonia   . Pulmonary embolus ([place])   . Seasonal allergies   . Spinal stenosis of lumbar region   . 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 Wheezing     Medications:  Cefepime 2 Gm IV x 1 dose ordered by the ED provider Vancomycin 1000 mg IV ordered by the MD provider  Assessment: 57 yo male with adenocarcinoma of lung seen in the ED for 10 day history of increasing cough with productive sputum; failed OP treatment w/ levofloxacin. Pharmacy has been consulted for vancomycin and cefepime dosing for HCAP.  Goal of Therapy:  Vancomycin troughs 15-20 mcg/ml Eradicate infection  Plan:  Preliminary review of pertinent patient information completed.   Protocol will be initiated with doses of Vancomycin 1500 mg IV and Cefepime 2 Gm IV.  Forestine Na clinical pharmacist will complete review during morning rounds to assess patient and finalize treatment regimen if needed.  Norberto Sorenson, [facility] 01/28/2018,11:45 PM

## 2018-01-28 NOTE — H&P (Signed)
History and Physical    TWAIN STENSETH NTZ:[id number] DOB: December 24, 1960 DOA: 01/28/2018  PCP: [name], MD Patient coming from: home  Chief Complaint:   HPI: Erik [name] is a 56 y.o. male with medical history significant of adenocarcinoma of the lung, COPD, GERD, hypertension, pulmonary embolus, Arnold-Chiari syndrome.   Patient reporting approximately 10 day history of increasing cough with productive sputum, worsening fatigue, decreased energy, and lack of appetite. Symptoms somewhat waxing and waning but overall becoming worse. Patient was started on Levaquin after visit with his oncologist on 01/23/2018. Initially patient felt some improvement but states he has become significantly worse over the last 24 hours. Currently complaining of chills, intermittent sweats, shortness of breath. Patient states compliance with his Levaquin as well as his other chronic medications. Patient did not have his chemotherapy on 218 due to his illness.  ED Course: Objective findings below. Started on HCAP protocol  Review of Systems: As per HPI otherwise all other systems reviewed and are negative  Ambulatory Status:no restrictions  Past Medical History:  Diagnosis Date  . Arnold-Chiari syndrome ([place])   . Arthritis   . Asthma   . Chronic back pain   . COPD (chronic obstructive pulmonary disease) ([place])   . Depression   . Dyspnea    with exertion   . GERD (gastroesophageal reflux disease)   . Hypertension   . Pneumonia   . Pulmonary embolus ([place])   . Seasonal allergies   . Spinal stenosis of lumbar region   . 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 Wheezing     Past Surgical History:  Procedure Laterality Date  . BACK SURGERY     5 total  . BIOPSY  01/03/2017   Procedure: BIOPSY;  Surgeon: Daneil Dolin, MD;  [facility];  Service: Endoscopy;;  gastric  . COLONOSCOPY WITH PROPOFOL N/A 04/04/2017   Procedure: COLONOSCOPY WITH PROPOFOL;  Surgeon: Daneil Dolin, MD;  [facility];  Service: Endoscopy;  Laterality: N/A;  8:45am  . ESOPHAGOGASTRODUODENOSCOPY (EGD) WITH PROPOFOL N/A 01/03/2017   Procedure: ESOPHAGOGASTRODUODENOSCOPY (EGD) WITH PROPOFOL;  Surgeon: Daneil Dolin, MD;  [facility];  Service: Endoscopy;  Laterality: N/A;  . Venia Minks DILATION N/A 01/03/2017   Procedure: Venia Minks DILATION;  Surgeon: Daneil Dolin, MD;  [facility];  Service: Endoscopy;  Laterality: N/A;  . MULTIPLE EXTRACTIONS WITH ALVEOLOPLASTY N/A 07/08/2014   Procedure: MULTIPLE EXTRACION WITH ALVEOLOPLASTY with EXCISION LESION RIGHT SIDE OF TONGUE;  Surgeon: Gae Bon, DDS;  Location: [place];  Service: Oral Surgery;  Laterality: N/A;  . PORTACATH PLACEMENT Right 07/30/2016   Procedure: INSERTION PORT-A-CATH;  Surgeon: Vickie Epley, MD;  Location: [facility];  Service: Vascular;  Laterality: Right;  [name] VIDEO BRONCHOSCOPY WITH ENDOBRONCHIAL ULTRASOUND N/A 07/19/2016   Procedure: VIDEO BRONCHOSCOPY WITH ENDOBRONCHIAL ULTRASOUND;  Surgeon: [name], MD;  Location: [place];  Service: Thoracic;  Laterality: N/A;    Social History   Socioeconomic History  . Marital status: Divorced    Spouse name: Not on file  . Number of children: Not on file  . Years of education: Not on file  . Highest education level: Not on file  Social Needs  . Financial resource strain: Not on file  . Food insecurity - worry: Not on file  . Food insecurity - inability: Not on file  . Transportation needs - medical: Not on file  . Transportation needs - non-medical:  Not on file  Occupational History  . Not on file  Tobacco Use  . Smoking status: Current Every Day Smoker    Packs/day: 0.25    Years: 38.00    Pack years: 9.50    Types: Cigarettes  . Smokeless tobacco: Former Systems developer    Types: Chew  . Tobacco comment: using chantix- smoking once in a while  Substance and Sexual Activity  . Alcohol use: Yes    Alcohol/week: 1.2 oz    Types: 2 Cans  of beer per week    Comment: reports drinking 0-2 beers a week  . Drug use: Yes    Frequency: 7.0 times per week    Types: Marijuana    Comment: daily for cancer; pt denies any use at present time 08/23/17  . Sexual activity: Not Currently  Other Topics Concern  . Not on file  Social History Narrative  . Not on file    Allergies  Allergen Reactions  . Demeclocycline Other (See Comments) and Swelling  . Tetracyclines & Related Swelling and Other (See Comments)         Family History  Problem Relation Age of Onset  . Cancer Mother        breast cancer  . Heart failure Brother   . Colon cancer Neg Hx        not sure, ?grandfather and/or uncle      Prior to Admission medications   Medication Sig Start Date End Date Taking? Authorizing Provider  albuterol (PROVENTIL HFA;VENTOLIN HFA) 108 (90 Base) MCG/ACT inhaler Inhale 2 puffs into the lungs every 6 (six) hours as needed for wheezing. 12/26/17   Jacquelin Hawking, NP  albuterol (PROVENTIL HFA;VENTOLIN HFA) 108 (90 Base) MCG/ACT inhaler  01/05/18   [provider]  ALPRAZolam Duanne Moron) 1 MG tablet Take 1 mg by mouth 2 (two) times daily as needed for anxiety or sleep (takes 1 tablet and bedtime for sleep and 1 dose during the day only if needed).  06/26/16   [provider]  amLODipine (NORVASC) 5 MG tablet Take 5 mg by mouth daily.  07/07/16   [provider]  apixaban (ELIQUIS) 5 MG TABS tablet Take 1 tablet (5 mg total) by mouth 2 (two) times daily. 01/23/18   Jacquelin Hawking, NP  Artificial Tear Solution (SOOTHE XP) SOLN Apply 2 drops to eye daily.    [provider]  Atezolizumab (TECENTRIQ IV) Inject into the vein. Every 3 weeks    [provider]  budesonide-formoterol (SYMBICORT) 160-4.5 MCG/ACT inhaler Inhale 2 puffs into the lungs daily.     [provider]  ELIQUIS STARTER PACK (ELIQUIS STARTER PACK) 5 MG TABS Take as directed on package: start with two-5mg  tablets twice  daily for 7 days. On day 8, switch to one-5mg  tablet twice daily. 12/26/17   Jacquelin Hawking, NP  escitalopram (LEXAPRO) 20 MG tablet Take 20 mg by mouth daily.  07/07/16   [provider]  gabapentin (NEURONTIN) 600 MG tablet Take 600 mg by mouth 2 (two) times daily.  08/19/17   [provider]  guaiFENesin (MUCINEX) 600 MG 12 hr tablet Take 600 mg by mouth 2 (two) times daily as needed (for congestion.).    [provider]  hydrochlorothiazide (MICROZIDE) 12.5 MG capsule  11/15/17   [provider]  lactulose (CHRONULAC) 10 GM/15ML solution Take 15 mLs (10 g total) by mouth 3 (three) times daily as needed for mild constipation or moderate constipation. 01/23/18  Jacquelin Hawking, NP  levofloxacin (LEVAQUIN) 500 MG tablet Take 1 tablet (500 mg total) by mouth daily. 01/23/18   Jacquelin Hawking, NP  lidocaine (XYLOCAINE) 2 % solution Use as directed 20 mLs in the mouth or throat every 6 (six) hours as needed (for mouth pain (used prior to eating when needed)).    [provider]  lidocaine-prilocaine (EMLA) cream Apply a quarter size amount to port site 1 hour prior to chemo. Do not rub in. Cover with plastic wrap. Patient not taking: Reported on 01/02/2018 08/02/16   Baird Cancer, PA-C  methadone (DOLOPHINE) 10 MG tablet Take 20 mg by mouth 5 (five) times daily. Takes 2 tablets 5 times daily 06/26/16   [provider]  mirtazapine (REMERON) 15 MG tablet Take 1 tablet by mouth at bedtime. 08/17/17   [provider]  Multiple Vitamin (MULTIVITAMIN WITH MINERALS) TABS tablet Take 1 tablet by mouth daily. Centrum    [provider]  ondansetron (ZOFRAN) 8 MG tablet TAKE 1 TABLET BY MOUTH EVERY 8 HOURS AS NEEDED FOR NAUSEA AND VOMITING. 10/20/17   Holley Bouche, NP  pantoprazole (PROTONIX) 40 MG tablet TAKE (1) TABLET BY MOUTH TWICE DAILY. 10/20/17   Holley Bouche, NP  predniSONE (STERAPRED UNI-PAK 21 TAB) 10 MG (21) TBPK  tablet Take as prescribed. 12/26/17   Jacquelin Hawking, NP  predniSONE (STERAPRED UNI-PAK 21 TAB) 10 MG (21) TBPK tablet Take as directed. 01/23/18   Jacquelin Hawking, NP  prochlorperazine (COMPAZINE) 10 MG tablet TAKE 1 TABLET EVERY 6 HOURS AS NEEDED FOR NAUSEA AND VOMITING. 09/20/17   Holley Bouche, NP  tiZANidine (ZANAFLEX) 4 MG tablet Take 4 mg by mouth every 8 (eight) hours as needed for muscle spasms.  06/03/16   [provider]  Donnal Debar 100-62.5-25 MCG/INH AEPB  01/11/18   [provider]  varenicline (CHANTIX) 0.5 MG tablet TAKE (2) TABLETS BY MOUTH TWICE DAILY. 01/10/18   Holley Bouche, NP    Physical Exam: Vitals:   01/28/18 1616 01/28/18 1630 01/28/18 1633 01/28/18 1725  BP:  (!) 131/91  (!) 147/99  Pulse:  (!) 110  98  Resp:  (!) 21  18  Temp:   97.9 F (36.6 C)   TempSrc:   Oral Oral  SpO2: 97% 98%  100%  Weight:         General: Ill appearing resting in bed.  Eyes:  PERRL, EOMI, normal lids, iris ENT:  grossly normal hearing, lips & tongue, mmm Neck:  no LAD, masses or thyromegaly Cardiovascular:  RRR, no m/r/g. No LE edema.  Respiratory: diffuse ronchi w/ few crackles. No wheezing. Mildly increased effort.  Abdomen:  soft, ntnd, NABS Skin:  no rash or induration seen on limited exam Musculoskeletal:  grossly normal tone BUE/BLE, good ROM, no bony abnormality Psychiatric:  grossly normal mood and affect, speech fluent and appropriate, AOx3 Neurologic:  CN 2-12 grossly intact, moves all extremities in coordinated fashion, sensation intact  Labs on Admission: I have personally reviewed following labs and imaging studies  CBC: Recent Labs  Lab 01/23/18 0914 01/28/18 1612  WBC 17.6* 16.3*  NEUTROABS 14.4* 13.5*  HGB 13.1 14.1  HCT 39.9 41.5  MCV 94.5 90.2  PLT 275 992   Basic Metabolic Panel: Recent Labs  Lab 01/23/18 0914 01/28/18 1612  NA 132* 135  K 3.7 3.5  CL 90* 96*  CO2 30 25  GLUCOSE 159* 130*  BUN 8 8  CREATININE 0.77 0.60*  CALCIUM 8.3* 8.6*   GFR: Estimated Creatinine Clearance: 128.7 mL/min (A) (by C-G formula based on SCr of 0.6 mg/dL (L)). Liver Function Tests: Recent Labs  Lab 01/23/18 0914 01/28/18 1612  AST 60* 57*  ALT 38 71*  ALKPHOS 241* 264*  BILITOT 1.6* 0.9  PROT 6.9 7.3  ALBUMIN 2.1* 2.4*   No results for input(s): LIPASE, AMYLASE in the last 168 hours. No results for input(s): AMMONIA in the last 168 hours. Coagulation Profile: No results for input(s): INR, PROTIME in the last 168 hours. Cardiac Enzymes: No results for input(s): CKTOTAL, CKMB, CKMBINDEX, TROPONINI in the last 168 hours. BNP (last 3 results) No results for input(s): PROBNP in the last 8760 hours. HbA1C: No results for input(s): HGBA1C in the last 72 hours. CBG: No results for input(s): GLUCAP in the last 168 hours. Lipid Profile: No results for input(s): CHOL, HDL, LDLCALC, TRIG, CHOLHDL, LDLDIRECT in the last 72 hours. Thyroid Function Tests: No results for input(s): TSH, T4TOTAL, FREET4, T3FREE, THYROIDAB in the last 72 hours. Anemia Panel: No results for input(s): VITAMINB12, FOLATE, FERRITIN, TIBC, IRON, RETICCTPCT in the last 72 hours. Urine analysis: No results found for: COLORURINE, APPEARANCEUR, LABSPEC, PHURINE, GLUCOSEU, HGBUR, BILIRUBINUR, KETONESUR, PROTEINUR, UROBILINOGEN, NITRITE, LEUKOCYTESUR  Creatinine Clearance: Estimated Creatinine Clearance: 128.7 mL/min (A) (by C-G formula based on SCr of 0.6 mg/dL (L)).  Sepsis Labs: @LABRCNTIP (procalcitonin:4,lacticidven:4) )No results found for this or any previous visit (from the past 240 hour(s)).   Radiological Exams on Admission: Ct Angio Chest Pe W And/or Wo Contrast  Result Date: 01/28/2018 CLINICAL DATA:  57 year old male with acute shortness of breath today. History of lung cancer and pulmonary embolus several weeks ago. EXAM: CT ANGIOGRAPHY CHEST WITH CONTRAST TECHNIQUE: Multidetector CT imaging of the chest was  performed using the standard protocol during bolus administration of intravenous contrast. Multiplanar CT image reconstructions and MIPs were obtained to evaluate the vascular anatomy. CONTRAST:  144mL ISOVUE-370 IOPAMIDOL (ISOVUE-370) INJECTION 76% COMPARISON:  None. FINDINGS: Cardiovascular: This is a technically satisfactory study. No pulmonary emboli are identified. The previously identified embolus within the LEFT LOWER lobe is no longer visualized. Heart is unchanged. A small pericardial effusion again noted. There is no evidence of thoracic aortic aneurysm. Mediastinum/Nodes: Multiple enlarged mediastinal, hilar, and LEFT axillary lymphadenopathy again noted and unchanged. Index lymph nodes include the following: A 1.2 cm high LEFT SUPERIOR mediastinal node (4:29) A 1.5 cm LEFT prevascular node (4:35). A 1.9 cm AP window node (4:47) A 1.5 cm subcarinal node (4:48) A 1 cm LEFT axillary node (4:23). Mediastinal shift to the RIGHT again noted. Mild diffuse esophageal wall thickening is again noted. Lungs/Pleura: New consolidation/airspace disease throughout the posterior RIGHT lung identified likely representing pneumonia. There is a cavitary component to the consolidation in the RIGHT UPPER lung (6:51). Paramedian consolidation/opacity within the RIGHT lung is unchanged compatible with radiation changes. Small amount of ground-glass opacity within the LEFT UPPER lobe (6: 40-47) is nonspecific. A moderate loculated RIGHT basilar pleural effusion is unchanged. Upper Abdomen: Hepatosplenomegaly again noted. No acute abnormality. Mildly enlarged UPPER abdominal lymph nodes are unchanged. Musculoskeletal: No acute abnormality. Review of the MIP images confirms the above findings. IMPRESSION: 1. New consolidation/airspace disease throughout the posterior RIGHT lung likely representing pneumonia. Cavitary component may reflect necrotizing pneumonia. 2. No evidence of pulmonary emboli. LEFT LOWER lobe pulmonary embolus  identified on the previous study is no longer visualized. 3. Unchanged thoracic and UPPER abdominal lymphadenopathy, moderate loculated RIGHT basilar pleural effusion  and hepatosplenomegaly. Electronically Signed   By: Margarette Canada M.D.   On: 01/28/2018 17:29      Assessment/Plan Active Problems:   HCAP (healthcare-associated pneumonia)    HCAP/COPD: failed outpt therapy of levaquin in part due to underlying adenocarcinoma of the lung. CT as above w/ cavetary lesion.  - DC Levaquin and start Vanc and Cefepime - PNeumonia protocol - f/u cultures - Duonebs prn - continue Dulera  Adenocarcinoma of lung: Stage IV. Missed last chemotherapy session on 01/23/18 (see oncology note). - oncology consult  PE: recently dx. No PE on CT that was previously dx on 12/26/17 study - continue Eliquis for now.   Depression: - continue lexapro, Remeron  GERD: - continue ppi  Chronic pain: - continue neurontin, methadone  HTN: - contineu HCTZ  Tobacco: - contineu Chantix  DVT prophylaxis: Eliquis  Code Status: full  Family Communication: wife  Disposition Plan: pending improvement in condition. May need pulm consult  Consults called: oncology  Admission status: inpt    Waldemar Dickens MD [facility]  If 7PM-7AM, please contact night-coverage [email or web address] Password [facility]  01/28/2018, 5:54 PM

## 2018-01-28 NOTE — ED Provider Notes (Signed)
[facility] EMERGENCY DEPARTMENT Provider Note   CSN: [id number] Arrival date & time: 01/28/18  1550     History   Chief Complaint Chief Complaint  Patient presents with  . Shortness of Breath    HPI Erik [name] is a 57 y.o. male.  Shortness of breath progressively worsening for approximately Erik past 1 month.  Other associated symptoms include mild cough and subjective fever patient seen by Erik Casa, NP on 01/23/2018 for same complaint had chest x-ray consistent with pleural effusion.  Prescribed prednisone as well levofloxacin as treating himself with albuterol with out adequate relief. They make symptoms better or worse.  No other associated symptoms.Erik [name] HPI  Past Medical History:  Diagnosis Date  . Erik [name] (Erik [name])   . Arthritis   . Asthma   . Chronic back pain   . COPD (chronic obstructive pulmonary disease) (Erik [name])   . Depression   . Dyspnea    with exertion   . GERD (gastroesophageal reflux disease)   . Hypertension   . Pneumonia   . Pulmonary embolus (Erik [name])   . Seasonal allergies   . Spinal stenosis of lumbar region   . 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 Wheezing     Patient Active Problem List   Diagnosis Date Noted  . Taking multiple medications for chronic disease 03/08/2017  . Radiation-induced esophagitis 02/02/2017  . Loss of weight 12/10/2016  . Encounter for screening colonoscopy 12/10/2016  . Esophageal dysphagia 12/10/2016  . Abdominal pain, epigastric 12/10/2016  . 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 GERD 01/13/2009  . ACTINIC KERATOSIS 01/13/2009  . ANXIETY 12/27/2008  . DEPRESSION 12/27/2008  . ASTHMA 12/27/2008  . OSTEOARTHRITIS 12/27/2008  . LOW BACK PAIN 12/27/2008    Past Surgical History:  Procedure Laterality Date  . BACK SURGERY     5 total  . BIOPSY  01/03/2017   Procedure: BIOPSY;  Surgeon: Daneil Dolin, MD;  [facility];  Service: Endoscopy;;  gastric  .  COLONOSCOPY WITH PROPOFOL N/A 04/04/2017   Procedure: COLONOSCOPY WITH PROPOFOL;  Surgeon: Daneil Dolin, MD;  Location: [facility];  Service: Endoscopy;  Laterality: N/A;  8:45am  . ESOPHAGOGASTRODUODENOSCOPY (EGD) WITH PROPOFOL N/A 01/03/2017   Procedure: ESOPHAGOGASTRODUODENOSCOPY (EGD) WITH PROPOFOL;  Surgeon: Daneil Dolin, MD;  Location: [facility];  Service: Endoscopy;  Laterality: N/A;  . Venia Minks DILATION N/A 01/03/2017   Procedure: Venia Minks DILATION;  Surgeon: Daneil Dolin, MD;  Location: [facility];  Service: Endoscopy;  Laterality: N/A;  . MULTIPLE EXTRACTIONS WITH ALVEOLOPLASTY N/A 07/08/2014   Procedure: MULTIPLE EXTRACION WITH ALVEOLOPLASTY with EXCISION LESION RIGHT SIDE OF TONGUE;  Surgeon: Gae Bon, DDS;  Location: [name];  Service: Oral Surgery;  Laterality: N/A;  . PORTACATH PLACEMENT Right 07/30/2016   Procedure: INSERTION [name]-A-CATH;  Surgeon: Vickie Epley, MD;  Location: [facility];  Service: Vascular;  Laterality: Right;  Erik [name] VIDEO BRONCHOSCOPY WITH ENDOBRONCHIAL ULTRASOUND N/A 07/19/2016   Procedure: VIDEO BRONCHOSCOPY WITH ENDOBRONCHIAL ULTRASOUND;  Surgeon: [name], MD;  Location: [place];  Service: Thoracic;  Laterality: N/A;       Home Medications    Prior to Admission medications   Medication Sig Start Date End Date Taking? Authorizing Provider  albuterol (PROVENTIL HFA;VENTOLIN HFA) 108 (90 Base) MCG/ACT inhaler Inhale 2 puffs into Erik lungs every 6 (six) hours as needed for wheezing. 12/26/17   Jacquelin Hawking, NP  albuterol (PROVENTIL HFA;VENTOLIN  HFA) 108 (90 Base) MCG/ACT inhaler  01/05/18   [provider]  ALPRAZolam Duanne Moron) 1 MG tablet Take 1 mg by mouth 2 (two) times daily as needed for anxiety or sleep (takes 1 tablet and bedtime for sleep and 1 dose during Erik day only if needed).  06/26/16   [provider]  amLODipine (NORVASC) 5 MG tablet Take 5 mg by mouth daily.  07/07/16   [provider]  apixaban  (ELIQUIS) 5 MG TABS tablet Take 1 tablet (5 mg total) by mouth 2 (two) times daily. 01/23/18   Jacquelin Hawking, NP  Artificial Tear Solution (SOOTHE XP) SOLN Apply 2 drops to eye daily.    [provider]  Atezolizumab (TECENTRIQ IV) Inject into Erik vein. Every 3 weeks    [provider]  budesonide-formoterol (SYMBICORT) 160-4.5 MCG/ACT inhaler Inhale 2 puffs into Erik lungs daily.     [provider]  ELIQUIS STARTER PACK (ELIQUIS STARTER PACK) 5 MG TABS Take as directed on package: start with two-5mg  tablets twice daily for 7 days. On day 8, switch to one-5mg  tablet twice daily. 12/26/17   Jacquelin Hawking, NP  escitalopram (LEXAPRO) 20 MG tablet Take 20 mg by mouth daily.  07/07/16   [provider]  gabapentin (NEURONTIN) 600 MG tablet Take 600 mg by mouth 2 (two) times daily.  08/19/17   [provider]  guaiFENesin (MUCINEX) 600 MG 12 hr tablet Take 600 mg by mouth 2 (two) times daily as needed (for congestion.).    [provider]  hydrochlorothiazide (MICROZIDE) 12.5 MG capsule  11/15/17   [provider]  lactulose (CHRONULAC) 10 GM/15ML solution Take 15 mLs (10 g total) by mouth 3 (three) times daily as needed for mild constipation or moderate constipation. 01/23/18   Jacquelin Hawking, NP  levofloxacin (LEVAQUIN) 500 MG tablet Take 1 tablet (500 mg total) by mouth daily. 01/23/18   Jacquelin Hawking, NP  lidocaine (XYLOCAINE) 2 % solution Use as directed 20 mLs in Erik mouth or throat every 6 (six) hours as needed (for mouth pain (used prior to eating when needed)).    [provider]  lidocaine-prilocaine (EMLA) cream Apply a quarter size amount to [name] site 1 hour prior to chemo. Do not rub in. Cover with plastic wrap. Patient not taking: Reported on 01/02/2018 08/02/16   Baird Cancer, PA-C  methadone (DOLOPHINE) 10 MG tablet Take 20 mg by mouth 5 (five) times daily. Takes 2 tablets 5 times daily 06/26/16   [provider]  mirtazapine (REMERON) 15 MG tablet Take 1 tablet by mouth at bedtime. 08/17/17   [provider]  Multiple Vitamin (MULTIVITAMIN WITH MINERALS) TABS tablet Take 1 tablet by mouth daily. Centrum    [provider]  ondansetron (ZOFRAN) 8 MG tablet TAKE 1 TABLET BY MOUTH EVERY 8 HOURS AS NEEDED FOR NAUSEA AND VOMITING. 10/20/17   Holley Bouche, NP  pantoprazole (PROTONIX) 40 MG tablet TAKE (1) TABLET BY MOUTH TWICE DAILY. 10/20/17   Holley Bouche, NP  predniSONE (STERAPRED UNI-PAK 21 TAB) 10 MG (21) TBPK tablet Take as prescribed. 12/26/17   Jacquelin Hawking, NP  predniSONE (STERAPRED UNI-PAK 21 TAB) 10 MG (21) TBPK tablet Take as directed. 01/23/18   Jacquelin Hawking, NP  prochlorperazine (COMPAZINE) 10 MG tablet TAKE 1 TABLET EVERY 6 HOURS AS NEEDED FOR NAUSEA AND VOMITING. 09/20/17   Holley Bouche, NP  tiZANidine (ZANAFLEX) 4 MG tablet Take 4  mg by mouth every 8 (eight) hours as needed for muscle spasms.  06/03/16   [provider]  Donnal Debar 100-62.5-25 MCG/INH AEPB  01/11/18   [provider]  varenicline (CHANTIX) 0.5 MG tablet TAKE (2) TABLETS BY MOUTH TWICE DAILY. 01/10/18   Holley Bouche, NP    Family History Family History  Problem Relation Age of Onset  . Cancer Mother        breast cancer  . Heart failure Brother   . Colon cancer Neg Hx        not sure, ?grandfather and/or uncle    Social History Social History   Tobacco Use  . Smoking status: Current Every Day Smoker    Packs/day: 0.25    Years: 38.00    Pack years: 9.50    Types: Cigarettes  . Smokeless tobacco: Former Systems developer    Types: Chew  . Tobacco comment: using chantix- smoking once in a while  Substance Use Topics  . Alcohol use: Yes    Alcohol/week: 1.2 oz    Types: 2 Cans of beer per week    Comment: reports drinking 0-2 beers a week  . Drug use: Yes    Frequency: 7.0 times per week    Types: Marijuana    Comment: daily for cancer; pt  denies any use at present time 08/23/17     Allergies   Demeclocycline and Tetracyclines & related   Review of Systems Review of Systems  Constitutional: Positive for fever.       Subjective fever  HENT: Negative.   Respiratory: Positive for cough and shortness of breath.   Cardiovascular: Negative.   Gastrointestinal: Negative.   Musculoskeletal: Negative.   Skin: Negative.   Allergic/Immunologic: Positive for immunocompromised state.       Cancer patient  Neurological: Negative.   Psychiatric/Behavioral: Negative.   All other systems reviewed and are negative.    Physical Exam Updated Vital Signs BP (!) 127/92 (BP Location: Right Arm)   Pulse (!) 110   Resp (!) 25   Wt 101.6 kg (224 lb)   SpO2 97%   BMI 30.38 kg/m   Physical Exam  Constitutional: He is oriented to person, place, and time. He appears well-developed and well-nourished. He appears distressed.  Ears mildly uncomfortable nontoxic appearing  HENT:  Head: Normocephalic and atraumatic.  Eyes: Conjunctivae are normal. Pupils are equal, round, and reactive to light.  Neck: Neck supple. No tracheal deviation present. No thyromegaly present.  Cardiovascular: Regular rhythm.  No murmur heard. Mildly tachycardic  Pulmonary/Chest: Effort normal.  Diminished breath sounds throughout [name]-A-Cath in place at right anterior chest site not red warm or tender  Abdominal: Soft. Bowel sounds are normal. He exhibits no distension. There is no tenderness.  Musculoskeletal: Normal range of motion. He exhibits no edema or tenderness.  Neurological: He is alert and oriented to person, place, and time. Coordination normal.  Skin: Skin is warm and dry. No rash noted.  Psychiatric: He has a normal mood and affect.  Nursing note and vitals reviewed.    ED Treatments / Results  Labs (all labs ordered are listed, but only abnormal results are displayed) Labs Reviewed  CULTURE, BLOOD (ROUTINE X 2)  CULTURE, BLOOD (ROUTINE  X 2)  COMPREHENSIVE METABOLIC PANEL  CBC WITH DIFFERENTIAL/PLATELET  I-STAT CG4 LACTIC ACID, ED    EKG  EKG Interpretation None      ED ECG REPORT   Date: 01/28/2018  Rate: 110  Rhythm: sinus tachycardia  QRS Axis: normal  Intervals: normal  ST/T Wave abnormalities: normal  Conduction Disutrbances:none  Narrative Interpretation:   Old EKG Reviewed: unchanged  I have personally reviewed Erik EKG tracing and agree with Erik computerized printout as noted. Radiology No results found. Results for orders placed or performed during Erik hospital encounter of 01/28/18  Comprehensive metabolic panel  Result Value Ref Range   Sodium 135 135 - 145 mmol/L   Potassium 3.5 3.5 - 5.1 mmol/L   Chloride 96 (L) 101 - 111 mmol/L   CO2 25 22 - 32 mmol/L   Glucose, Bld 130 (H) 65 - 99 mg/dL   BUN 8 6 - 20 mg/dL   Creatinine, Ser 0.60 (L) 0.61 - 1.24 mg/dL   Calcium 8.6 (L) 8.9 - 10.3 mg/dL   Total Protein 7.3 6.5 - 8.1 g/dL   Albumin 2.4 (L) 3.5 - 5.0 g/dL   AST 57 (H) 15 - 41 U/L   ALT 71 (H) 17 - 63 U/L   Alkaline Phosphatase 264 (H) 38 - 126 U/L   Total Bilirubin 0.9 0.3 - 1.2 mg/dL   GFR calc non Af Amer >60 >60 mL/min   GFR calc Af Amer >60 >60 mL/min   Anion gap 14 5 - 15  CBC WITH DIFFERENTIAL  Result Value Ref Range   WBC 16.3 (H) 4.0 - 10.5 K/uL   RBC 4.60 4.22 - 5.81 MIL/uL   Hemoglobin 14.1 13.0 - 17.0 g/dL   HCT 41.5 39.0 - 52.0 %   MCV 90.2 78.0 - 100.0 fL   MCH 30.7 26.0 - 34.0 pg   MCHC 34.0 30.0 - 36.0 g/dL   RDW 15.5 11.5 - 15.5 %   Platelets 328 150 - 400 K/uL   Neutrophils Relative % 83 %   Lymphocytes Relative 9 %   Monocytes Relative 8 %   Eosinophils Relative 0 %   Basophils Relative 0 %   Neutro Abs 13.5 (H) 1.7 - 7.7 K/uL   Lymphs Abs 1.5 0.7 - 4.0 K/uL   Monocytes Absolute 1.3 (H) 0.1 - 1.0 K/uL   Eosinophils Absolute 0.0 0.0 - 0.7 K/uL   Basophils Absolute 0.0 0.0 - 0.1 K/uL   WBC Morphology MILD LEFT SHIFT (1-5% METAS, OCC MYELO, OCC BANDS)     I-Stat CG4 Lactic Acid, ED  (not at  [facility])  Result Value Ref Range   Lactic Acid, Venous 1.64 0.5 - 1.9 mmol/L   Dg Chest 2 View  Result Date: 01/23/2018 CLINICAL DATA:  Wheezing and shortness of breath. History of lung carcinoma on Erik right EXAM: CHEST  2 VIEW COMPARISON:  Chest radiograph December 26, 2017 and chest CT December 26, 2017 FINDINGS: There is widespread airspace consolidation throughout Erik right lung with volume loss. There is a right pleural effusion with areas of collapse primarily in Erik right upper lobe but also involving right lower lobe. Left lung is hyperexpanded but clear. Heart size is normal. Pulmonary vascularity is distorted on Erik right appears normal on Erik left. No adenopathy is appreciable. [name]-A-Cath tip is in Erik superior vena cava. No pneumothorax. No blastic or lytic bone lesions. IMPRESSION: Consolidation with volume loss throughout much of Erik right lung. There is pleural fluid on Erik right, better seen on recent CT. Left lung hyperexpanded but clear. Cardiac silhouette within normal limits. There is overall increased opacity on Erik right compared to recent prior studies. Electronically Signed   By: Lowella Grip III M.D.   On: 01/23/2018 10:56  Ct Angio Chest Pe W And/or Wo Contrast  Result Date: 01/28/2018 CLINICAL DATA:  57 year old male with acute shortness of breath today. History of lung cancer and pulmonary embolus several weeks ago. EXAM: CT ANGIOGRAPHY CHEST WITH CONTRAST TECHNIQUE: Multidetector CT imaging of Erik chest was performed using Erik standard protocol during bolus administration of intravenous contrast. Multiplanar CT image reconstructions and MIPs were obtained to evaluate Erik vascular anatomy. CONTRAST:  131mL ISOVUE-370 IOPAMIDOL (ISOVUE-370) INJECTION 76% COMPARISON:  None. FINDINGS: Cardiovascular: This is a technically satisfactory study. No pulmonary emboli are identified. Erik previously identified embolus within Erik LEFT LOWER lobe is  no longer visualized. Heart is unchanged. A small pericardial effusion again noted. There is no evidence of thoracic aortic aneurysm. Mediastinum/Nodes: Multiple enlarged mediastinal, hilar, and LEFT axillary lymphadenopathy again noted and unchanged. Index lymph nodes include Erik following: A 1.2 cm high LEFT SUPERIOR mediastinal node (4:29) A 1.5 cm LEFT prevascular node (4:35). A 1.9 cm AP window node (4:47) A 1.5 cm subcarinal node (4:48) A 1 cm LEFT axillary node (4:23). Mediastinal shift to Erik RIGHT again noted. Mild diffuse esophageal wall thickening is again noted. Lungs/Pleura: New consolidation/airspace disease throughout Erik posterior RIGHT lung identified likely representing pneumonia. There is a cavitary component to Erik consolidation in Erik RIGHT UPPER lung (6:51). Paramedian consolidation/opacity within Erik RIGHT lung is unchanged compatible with radiation changes. Small amount of ground-glass opacity within Erik LEFT UPPER lobe (6: 40-47) is nonspecific. A moderate loculated RIGHT basilar pleural effusion is unchanged. Upper Abdomen: Hepatosplenomegaly again noted. No acute abnormality. Mildly enlarged UPPER abdominal lymph nodes are unchanged. Musculoskeletal: No acute abnormality. Review of Erik MIP images confirms Erik above findings. IMPRESSION: 1. New consolidation/airspace disease throughout Erik posterior RIGHT lung likely representing pneumonia. Cavitary component may reflect necrotizing pneumonia. 2. No evidence of pulmonary emboli. LEFT LOWER lobe pulmonary embolus identified on Erik previous study is no longer visualized. 3. Unchanged thoracic and UPPER abdominal lymphadenopathy, moderate loculated RIGHT basilar pleural effusion and hepatosplenomegaly. Electronically Signed   By: Margarette Canada M.D.   On: 01/28/2018 17:29   Procedures Procedures (including critical care time)  Medications Ordered in ED Medications  ipratropium-albuterol (DUONEB) 0.5-2.5 (3) MG/3ML nebulizer solution 3 mL  (not administered)  ceFEPIme (MAXIPIME) 2 g in sodium chloride 0.9 % 100 mL IVPB (not administered)  vancomycin (VANCOCIN) IVPB 1000 mg/200 mL premix (not administered)   Initial Impression / Assessment and Plan / ED Course  I have reviewed Erik triage vital signs and Erik nursing notes.  Pertinent labs & imaging results that were available during my care of Erik patient were reviewed by me and considered in my medical decision making (see chart for details). Code sepsis called based on sirs criteria of heart rate and respiratory rate.  Source of infection felt to be respiratory    5:40 PM patient states breathing slightly improved after DuoNeb and treatment with intravenous antibiotics.  I have ordered second DuoNeb treatment. Sepsis - Repeat Assessment  Performed at:    540 pm  Vitals     Blood pressure (!) 147/99, pulse 98, temperature 97.9 F (36.6 C), temperature source Oral, resp. rate 18, weight 101.6 kg (224 lb), SpO2 100 %.  Heart:     Tachycardic Pulse 100 Lungs:    Diminished breath sounds diffusely  Capillary Refill:   <2 sec  Peripheral Pulse:   Radial pulse palpable  Skin:     Normal Color   CT angiogram ordered as patient at risk for pulmonary embolism  and has resting tachycardia.  CT scan consistent with pneumonia.  Lab work consistent with leukocytosis and sepsis.  I treated patient for healthcare associated pneumonia given that he is cancer patient receiving chemotherapy. Hospitalist physician consulted for admission Final Clinical Impressions(s) / ED Diagnoses  Diagnoses #1 sepsis #2 healthcare associated pneumonia Final diagnoses:  None   CRITICAL CARE Performed by: Orlie Dakin Total critical care time: 35 minutes Critical care time was exclusive of separately billable procedures and treating other patients. Critical care was necessary to treat or prevent imminent or life-threatening deterioration. Critical care was time spent personally by me on Erik  following activities: development of treatment plan with patient and/or surrogate as well as nursing, discussions with consultants, evaluation of patient's response to treatment, examination of patient, obtaining history from patient or surrogate, ordering and performing treatments and interventions, ordering and review of laboratory studies, ordering and review of radiographic studies, pulse oximetry and re-evaluation of patient's condition. ED Discharge Orders    None       Orlie Dakin, MD 01/28/18 [id number]

## 2018-01-29 ENCOUNTER — Other Ambulatory Visit: Payer: Self-pay

## 2018-01-29 ENCOUNTER — Encounter (HOSPITAL_COMMUNITY): Payer: Self-pay | Admitting: Emergency Medicine

## 2018-01-29 DIAGNOSIS — C3491 Malignant neoplasm of unspecified part of right bronchus or lung: Secondary | ICD-10-CM

## 2018-01-29 DIAGNOSIS — A419 Sepsis, unspecified organism: Secondary | ICD-10-CM

## 2018-01-29 LAB — COMPREHENSIVE METABOLIC PANEL
ALBUMIN: 2.2 g/dL — AB (ref 3.5–5.0)
ALT: 54 U/L (ref 17–63)
ANION GAP: 8 (ref 5–15)
AST: 37 U/L (ref 15–41)
Alkaline Phosphatase: 233 U/L — ABNORMAL HIGH (ref 38–126)
BILIRUBIN TOTAL: 0.8 mg/dL (ref 0.3–1.2)
BUN: 8 mg/dL (ref 6–20)
CO2: 32 mmol/L (ref 22–32)
Calcium: 8.5 mg/dL — ABNORMAL LOW (ref 8.9–10.3)
Chloride: 101 mmol/L (ref 101–111)
Creatinine, Ser: 0.63 mg/dL (ref 0.61–1.24)
Glucose, Bld: 73 mg/dL (ref 65–99)
Potassium: 3.8 mmol/L (ref 3.5–5.1)
Sodium: 141 mmol/L (ref 135–145)
TOTAL PROTEIN: 7 g/dL (ref 6.5–8.1)

## 2018-01-29 LAB — CBC
HEMATOCRIT: 42.3 % (ref 39.0–52.0)
HEMOGLOBIN: 13.8 g/dL (ref 13.0–17.0)
MCH: 30.9 pg (ref 26.0–34.0)
MCHC: 32.6 g/dL (ref 30.0–36.0)
MCV: 94.8 fL (ref 78.0–100.0)
Platelets: 314 10*3/uL (ref 150–400)
RBC: 4.46 MIL/uL (ref 4.22–5.81)
RDW: 15.7 % — ABNORMAL HIGH (ref 11.5–15.5)
WBC: 11.1 10*3/uL — AB (ref 4.0–10.5)

## 2018-01-29 MED ORDER — VANCOMYCIN HCL IN DEXTROSE 1-5 GM/200ML-% IV SOLN
1000.0000 mg | Freq: Two times a day (BID) | INTRAVENOUS | Status: DC
  Administered 2018-01-29 (×2): 1000 mg via INTRAVENOUS
  Filled 2018-01-29 (×3): qty 200

## 2018-01-29 MED ORDER — SODIUM CHLORIDE 0.9 % IV SOLN
3.0000 g | Freq: Three times a day (TID) | INTRAVENOUS | Status: DC
  Administered 2018-01-29 – 2018-02-01 (×9): 3 g via INTRAVENOUS
  Filled 2018-01-29 (×13): qty 3

## 2018-01-29 MED ORDER — CEFEPIME HCL 1 G IJ SOLR
1.0000 g | Freq: Three times a day (TID) | INTRAMUSCULAR | Status: DC
  Filled 2018-01-29 (×4): qty 1

## 2018-01-29 NOTE — Progress Notes (Signed)
Pharmacy Antibiotic Note  Erik [name] is a 57 y.o. male admitted on 01/28/2018 with pneumonia.  Pharmacy has been consulted for Vancomycin and Unasyn dosing.  Cefepime has been d/c'd  Plan:  Vancomycin 1000mg  IV q12h Check trough at steady state Unasyn 3gm IV q8h Monitor labs, renal fxn, progress and c/s Deescalate ABX when improved / appropriate.    Antimicrobials this admission: Vancomycin 2/23 >>  Cefepime 2/23 >>  Unasyn 2/24 >>  Dose adjustments this admission:  Microbiology results:  BCx: pending  UCx: pending   Sputum:    MRSA PCR:   Height: 6' (182.9 cm) Weight: 210 lb 12.2 oz (95.6 kg) IBW/kg (Calculated) : 77.6  Temp (24hrs), Avg:98.1 F (36.7 C), Min:97.9 F (36.6 C), Max:98.6 F (37 C)  Recent Labs  Lab 01/23/18 0914 01/28/18 1612 01/28/18 1617 01/29/18 0641  WBC 17.6* 16.3*  --  11.1*  CREATININE 0.77 0.60*  --  0.63  LATICACIDVEN  --   --  1.64  --     Estimated Creatinine Clearance: 125.1 mL/min (by C-G formula based on SCr of 0.63 mg/dL).    Allergies  Allergen Reactions  . Demeclocycline Other (See Comments) and Swelling  . Tetracyclines & Related Swelling and Other (See Comments)        Thank you for allowing pharmacy to be a part of this patient's care.  [name] 01/29/2018 12:35 PM

## 2018-01-29 NOTE — Progress Notes (Signed)
Pharmacy Antibiotic Note  Erik [name] is a 57 y.o. male admitted on 01/28/2018 with pneumonia.  Pharmacy has been consulted for Vancomycin and Cefepime dosing.  Plan:  Vancomycin 1000mg  IV q12h Check trough at steady state Cefepime 1gm  IV q8h Monitor labs, renal fxn, progress and c/s Deescalate ABX when improved / appropriate.    Antimicrobials this admission: Vancomycin 2/23 >>  Cefepime 2/23 >>   Dose adjustments this admission:  Microbiology results:  BCx: pending  UCx: pending   Sputum:    MRSA PCR:   Height: 6' (182.9 cm) Weight: 210 lb 12.2 oz (95.6 kg) IBW/kg (Calculated) : 77.6  Temp (24hrs), Avg:98.1 F (36.7 C), Min:97.9 F (36.6 C), Max:98.6 F (37 C)  Recent Labs  Lab 01/23/18 0914 01/28/18 1612 01/28/18 1617 01/29/18 0641  WBC 17.6* 16.3*  --  11.1*  CREATININE 0.77 0.60*  --  0.63  LATICACIDVEN  --   --  1.64  --     Estimated Creatinine Clearance: 125.1 mL/min (by C-G formula based on SCr of 0.63 mg/dL).    Allergies  Allergen Reactions  . Demeclocycline Other (See Comments) and Swelling  . Tetracyclines & Related Swelling and Other (See Comments)        Thank you for allowing pharmacy to be a part of this patient's care.  [name] 01/29/2018 9:26 AM

## 2018-01-29 NOTE — Progress Notes (Signed)
PROGRESS NOTE    Erik [name]  JYN:[id number] DOB: 11-Dec-1960 DOA: 01/28/2018 PCP: [name], MD     Brief Narrative:  57 year old man admitted from home on 2/23 with complaints of shortness of breath and fever.  He has a history of adenocarcinoma of the lung currently undergoing chemotherapy as well as COPD and history of PE.  He reports a 10-day history of increasing cough with productive sputum, worsening fatigue, decreased energy and lack of appetite.  He was seen by his oncologist and prescribed Levaquin but despite this has become significantly worse and came to the hospital for evaluation.   Assessment & Plan:   Active Problems:   Anxiety state   GERD   Adenocarcinoma of right lung ([place])   HCAP (healthcare-associated pneumonia)   Lobar pneumonia/acute hypoxemic respiratory failure -Failed outpatient treatment with Levaquin. -On CT scan has a cavitary lesion which raises question of staph aureus pneumonia versus anaerobic pathogens. -Given severity of airspace consolidation on CT, will consult pulmonary to make sure nothing further, like a bronchoscopy should be done. -We will continue vancomycin, will DC cefepime and place on Unasyn instead for better anaerobic coverage.  Check MRSA PCR and consider discontinuation of vancomycin if negative. -Influenza PCR is negative. -Culture data is pending. -Wean oxygen as tolerated.  Stage IV adenocarcinoma of the lung -Follow-up with oncology as scheduled.  Recently diagnosed PE -Continue Eliquis.  Depression -Continue Lexapro, Remeron  GERD -Continue PPI  Hypertension -Continue hydrochlorothiazide  Tobacco abuse -Continue Chantix as he is currently trying to quit.  Chronic pain syndrome -Continue methadone, Neurontin   DVT prophylaxis: Eliquis Code Status: Full code Family Communication: Patient only Disposition Plan: Pending medical stability  Consultants:   Pulmonology, pending  Procedures:    None  Antimicrobials:  Anti-infectives (From admission, onward)   Start     Dose/Rate Route Frequency Ordered Stop   01/29/18 1400  ceFEPIme (MAXIPIME) 1 g in sodium chloride 0.9 % 100 mL IVPB  Status:  Discontinued     1 g 200 mL/hr over 30 Minutes Intravenous Every 8 hours 01/29/18 0926 01/29/18 1152   01/29/18 1400  Ampicillin-Sulbactam (UNASYN) 3 g in sodium chloride 0.9 % 100 mL IVPB     3 g 200 mL/hr over 30 Minutes Intravenous Every 8 hours 01/29/18 1207     01/29/18 1000  vancomycin (VANCOCIN) IVPB 1000 mg/200 mL premix     1,000 mg 200 mL/hr over 60 Minutes Intravenous Every 12 hours 01/29/18 0926     01/29/18 0030  ceFEPIme (MAXIPIME) 2 g in sodium chloride 0.9 % 100 mL IVPB     2 g 200 mL/hr over 30 Minutes Intravenous  Once 01/28/18 2344 01/29/18 0251   01/28/18 2345  vancomycin (VANCOCIN) 1,500 mg in sodium chloride 0.9 % 500 mL IVPB     1,500 mg 250 mL/hr over 120 Minutes Intravenous  Once 01/28/18 2344     01/28/18 1615  ceFEPIme (MAXIPIME) 2 g in sodium chloride 0.9 % 100 mL IVPB     2 g 200 mL/hr over 30 Minutes Intravenous  Once 01/28/18 1608 01/28/18 1717   01/28/18 1615  vancomycin (VANCOCIN) IVPB 1000 mg/200 mL premix     1,000 mg 200 mL/hr over 60 Minutes Intravenous  Once 01/28/18 1608 01/28/18 1837       Subjective: In bed, mildly short of breath, no worse than admission  Objective: Vitals:   01/28/18 2250 01/29/18 0005 01/29/18 0625 01/29/18 1014  BP: (!) 144/108 136/82 (!) 133/98  Pulse: 100 98 93   Resp: 17     Temp:  98.6 F (37 C) 97.9 F (36.6 C)   TempSrc:  Oral Oral   SpO2: 99% 98% 99% 97%  Weight:  95.6 kg (210 lb 12.2 oz)    Height:  6' (1.829 m)      Intake/Output Summary (Last 24 hours) at 01/29/2018 1712 Last data filed at 01/29/2018 1256 Gross per 24 hour  Intake 670 ml  Output 2650 ml  Net -1980 ml   Filed Weights   01/28/18 1553 01/29/18 0005  Weight: 101.6 kg (224 lb) 95.6 kg (210 lb 12.2 oz)     Examination:  General exam: Alert, awake, oriented x 3 Respiratory system: Decreased breath sounds right base and right mid field Cardiovascular system:RRR. No murmurs, rubs, gallops. Gastrointestinal system: Abdomen is nondistended, soft and nontender. No organomegaly or masses felt. Normal bowel sounds heard. Central nervous system: Alert and oriented. No focal neurological deficits. Extremities: No C/C/E, +pedal pulses Skin: No rashes, lesions or ulcers Psychiatry: Judgement and insight appear normal. Mood & affect appropriate.     Data Reviewed: I have personally reviewed following labs and imaging studies  CBC: Recent Labs  Lab 01/23/18 0914 01/28/18 1612 01/29/18 0641  WBC 17.6* 16.3* 11.1*  NEUTROABS 14.4* 13.5*  --   HGB 13.1 14.1 13.8  HCT 39.9 41.5 42.3  MCV 94.5 90.2 94.8  PLT 275 328 161   Basic Metabolic Panel: Recent Labs  Lab 01/23/18 0914 01/28/18 1612 01/29/18 0641  NA 132* 135 141  K 3.7 3.5 3.8  CL 90* 96* 101  CO2 30 25 32  GLUCOSE 159* 130* 73  BUN 8 8 8   CREATININE 0.77 0.60* 0.63  CALCIUM 8.3* 8.6* 8.5*   GFR: Estimated Creatinine Clearance: 125.1 mL/min (by C-G formula based on SCr of 0.63 mg/dL). Liver Function Tests: Recent Labs  Lab 01/23/18 0914 01/28/18 1612 01/29/18 0641  AST 60* 57* 37  ALT 38 71* 54  ALKPHOS 241* 264* 233*  BILITOT 1.6* 0.9 0.8  PROT 6.9 7.3 7.0  ALBUMIN 2.1* 2.4* 2.2*   No results for input(s): LIPASE, AMYLASE in the last 168 hours. No results for input(s): AMMONIA in the last 168 hours. Coagulation Profile: No results for input(s): INR, PROTIME in the last 168 hours. Cardiac Enzymes: No results for input(s): CKTOTAL, CKMB, CKMBINDEX, TROPONINI in the last 168 hours. BNP (last 3 results) No results for input(s): PROBNP in the last 8760 hours. HbA1C: No results for input(s): HGBA1C in the last 72 hours. CBG: No results for input(s): GLUCAP in the last 168 hours. Lipid Profile: No results for  input(s): CHOL, HDL, LDLCALC, TRIG, CHOLHDL, LDLDIRECT in the last 72 hours. Thyroid Function Tests: No results for input(s): TSH, T4TOTAL, FREET4, T3FREE, THYROIDAB in the last 72 hours. Anemia Panel: No results for input(s): VITAMINB12, FOLATE, FERRITIN, TIBC, IRON, RETICCTPCT in the last 72 hours. Urine analysis: No results found for: COLORURINE, APPEARANCEUR, LABSPEC, PHURINE, GLUCOSEU, HGBUR, BILIRUBINUR, [place], [place], UROBILINOGEN, NITRITE, LEUKOCYTESUR Sepsis Labs: @LABRCNTIP (procalcitonin:4,lacticidven:4)  ) Recent Results (from the past 240 hour(s))  Blood Culture (routine x 2)     Status: None (Preliminary result)   Collection Time: 01/28/18  4:22 PM  Result Value Ref Range Status   Specimen Description BLOOD  Final   Special Requests NONE  Final   Culture   Final    NO GROWTH < 24 HOURS Performed at [facility], [address] [place], [place] [address]  Report Status PENDING  Incomplete  Blood Culture (routine x 2)     Status: None (Preliminary result)   Collection Time: 01/28/18  4:23 PM  Result Value Ref Range Status   Specimen Description BLOOD  Final   Special Requests NONE  Final   Culture   Final    NO GROWTH < 24 HOURS Performed at [facility], [address], [place], [place] [address]    Report Status PENDING  Incomplete         Radiology Studies: Ct Angio Chest Pe W And/or Wo Contrast  Result Date: 01/28/2018 CLINICAL DATA:  57 year old male with acute shortness of breath today. History of lung cancer and pulmonary embolus several weeks ago. EXAM: CT ANGIOGRAPHY CHEST WITH CONTRAST TECHNIQUE: Multidetector CT imaging of the chest was performed using the standard protocol during bolus administration of intravenous contrast. Multiplanar CT image reconstructions and MIPs were obtained to evaluate the vascular anatomy. CONTRAST:  149mL ISOVUE-370 IOPAMIDOL (ISOVUE-370) INJECTION 76% COMPARISON:  None. FINDINGS: Cardiovascular: This is a  technically satisfactory study. No pulmonary emboli are identified. The previously identified embolus within the LEFT LOWER lobe is no longer visualized. Heart is unchanged. A small pericardial effusion again noted. There is no evidence of thoracic aortic aneurysm. Mediastinum/Nodes: Multiple enlarged mediastinal, hilar, and LEFT axillary lymphadenopathy again noted and unchanged. Index lymph nodes include the following: A 1.2 cm high LEFT SUPERIOR mediastinal node (4:29) A 1.5 cm LEFT prevascular node (4:35). A 1.9 cm AP window node (4:47) A 1.5 cm subcarinal node (4:48) A 1 cm LEFT axillary node (4:23). Mediastinal shift to the RIGHT again noted. Mild diffuse esophageal wall thickening is again noted. Lungs/Pleura: New consolidation/airspace disease throughout the posterior RIGHT lung identified likely representing pneumonia. There is a cavitary component to the consolidation in the RIGHT UPPER lung (6:51). Paramedian consolidation/opacity within the RIGHT lung is unchanged compatible with radiation changes. Small amount of ground-glass opacity within the LEFT UPPER lobe (6: 40-47) is nonspecific. A moderate loculated RIGHT basilar pleural effusion is unchanged. Upper Abdomen: Hepatosplenomegaly again noted. No acute abnormality. Mildly enlarged UPPER abdominal lymph nodes are unchanged. Musculoskeletal: No acute abnormality. Review of the MIP images confirms the above findings. IMPRESSION: 1. New consolidation/airspace disease throughout the posterior RIGHT lung likely representing pneumonia. Cavitary component may reflect necrotizing pneumonia. 2. No evidence of pulmonary emboli. LEFT LOWER lobe pulmonary embolus identified on the previous study is no longer visualized. 3. Unchanged thoracic and UPPER abdominal lymphadenopathy, moderate loculated RIGHT basilar pleural effusion and hepatosplenomegaly. Electronically Signed   By: Margarette Canada M.D.   On: 01/28/2018 17:29        Scheduled Meds: . amLODipine   5 mg Oral Daily  . apixaban  5 mg Oral BID  . escitalopram  20 mg Oral Daily  . gabapentin  600 mg Oral BID  . hydrochlorothiazide  12.5 mg Oral Daily  . methadone  20 mg Oral 5 X Daily  . mirtazapine  15 mg Oral QHS  . mometasone-formoterol  2 puff Inhalation BID  . pantoprazole  40 mg Oral Daily  . varenicline  1 mg Oral BID   Continuous Infusions: . sodium chloride 75 mL/hr at 01/29/18 1513  . ampicillin-sulbactam (UNASYN) IV Stopped (01/29/18 1544)  . vancomycin    . vancomycin Stopped (01/29/18 1432)     LOS: 1 day    Time spent: 25 minutes. Greater than 50% of this time was spent in direct contact with the patient coordinating care.  Lelon Frohlich, MD [facility] Pager [phone]  If 7PM-7AM, please contact night-coverage [email or web address] Password [facility] 01/29/2018, 5:12 PM

## 2018-01-30 ENCOUNTER — Other Ambulatory Visit (HOSPITAL_COMMUNITY): Payer: Self-pay | Admitting: *Deleted

## 2018-01-30 LAB — CBC
HCT: 46 % (ref 39.0–52.0)
HEMOGLOBIN: 14.8 g/dL (ref 13.0–17.0)
MCH: 31 pg (ref 26.0–34.0)
MCHC: 32.2 g/dL (ref 30.0–36.0)
MCV: 96.2 fL (ref 78.0–100.0)
PLATELETS: 343 10*3/uL (ref 150–400)
RBC: 4.78 MIL/uL (ref 4.22–5.81)
RDW: 16 % — ABNORMAL HIGH (ref 11.5–15.5)
WBC: 14.6 10*3/uL — AB (ref 4.0–10.5)

## 2018-01-30 LAB — BASIC METABOLIC PANEL
ANION GAP: 9 (ref 5–15)
BUN: 12 mg/dL (ref 6–20)
CHLORIDE: 100 mmol/L — AB (ref 101–111)
CO2: 28 mmol/L (ref 22–32)
CREATININE: 0.74 mg/dL (ref 0.61–1.24)
Calcium: 8.3 mg/dL — ABNORMAL LOW (ref 8.9–10.3)
GFR calc non Af Amer: >60 mL/min (ref >60)
Glucose, Bld: 97 mg/dL (ref 65–99)
Potassium: 4 mmol/L (ref 3.5–5.1)
Sodium: 137 mmol/L (ref 135–145)

## 2018-01-30 LAB — EXPECTORATED SPUTUM ASSESSMENT W GRAM STAIN, RFLX TO RESP C

## 2018-01-30 LAB — MRSA PCR SCREENING: MRSA by PCR: NEGATIVE

## 2018-01-30 LAB — EXPECTORATED SPUTUM ASSESSMENT W REFEX TO RESP CULTURE

## 2018-01-30 LAB — STREP PNEUMONIAE URINARY ANTIGEN: STREP PNEUMO URINARY ANTIGEN: NEGATIVE

## 2018-01-30 MED ORDER — LEVOFLOXACIN IN D5W 750 MG/150ML IV SOLN
750.0000 mg | INTRAVENOUS | Status: DC
  Administered 2018-01-30 – 2018-01-31 (×2): 750 mg via INTRAVENOUS
  Filled 2018-01-30 (×2): qty 150

## 2018-01-30 NOTE — Consult Note (Addendum)
Consult requested by: Dr. Jerilee Hoh Consult requested for: Pneumonia/hypoxic respiratory failure  HPI: This is a 57 year old who had been in his usual state of poor health at home with history of adenocarcinoma of the lung, COPD, GERD, hypertension, pulmonary embolism and Arnold-Chiari syndrome when he developed increased cough and sputum production.  This started about 10 days ago and became worse.  He was started on Levaquin after seeing his oncologist and initially improved but then became substantially worse.  He has had chills sweats shortness of breath cough congestion sputum production.  He has been on chemotherapy but did not receive chemotherapy when scheduled on 218 because of his illness.  He has had some chest pain associated with coughing.  No abdominal pain nausea vomiting diarrhea no urinary symptoms.  He has had a headache that he associates with cough.  Past Medical History:  Diagnosis Date  . Arnold-Chiari syndrome ([place])   . Arthritis   . Asthma   . Chronic back pain   . COPD (chronic obstructive pulmonary disease) ([place])   . Depression   . Dyspnea    with exertion   . GERD (gastroesophageal reflux disease)   . Hypertension   . Pneumonia   . Pulmonary embolus ([place])   . Seasonal allergies   . Spinal stenosis of lumbar region   . 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 Wheezing      Family History  Problem Relation Age of Onset  . Cancer Mother        breast cancer  . Heart failure Brother   . Colon cancer Neg Hx        not sure, ?grandfather and/or uncle     Social History   Socioeconomic History  . Marital status: Divorced    Spouse name: None  . Number of children: None  . Years of education: None  . Highest education level: None  Social Needs  . Financial resource strain: None  . Food insecurity - worry: None  . Food insecurity - inability: None  . Transportation needs - medical: None  . Transportation needs - non-medical: None   Occupational History  . None  Tobacco Use  . Smoking status: Current Every Day Smoker    Packs/day: 0.25    Years: 38.00    Pack years: 9.50    Types: Cigarettes  . Smokeless tobacco: Former Systems developer    Types: Chew  . Tobacco comment: using chantix- smoking once in a while  Substance and Sexual Activity  . Alcohol use: Yes    Alcohol/week: 1.2 oz    Types: 2 Cans of beer per week    Comment: reports drinking 0-2 beers a week  . Drug use: Yes    Frequency: 7.0 times per week    Types: Marijuana    Comment: daily for cancer; pt denies any use at present time 08/23/17  . Sexual activity: Not Currently  Other Topics Concern  . None  Social History Narrative  . None     ROS: Except as mentioned 10 point review of systems is negative    Objective: Vital signs in last 24 hours: Temp:  [97.8 F (36.6 C)] 97.8 F (36.6 C) (02/24 2008) Pulse Rate:  [101-108] 101 (02/25 0615) BP: (96-110)/(75-87) 110/87 (02/25 0615) SpO2:  [94 %-98 %] 94 % (02/25 0800) Weight change:  Last BM Date: 01/26/18  Intake/Output from previous day: 02/24 0701 - 02/25 0700 In: 2921.3 [P.O.:240; I.V.:1981.3; IV Piggyback:700] Out: 1425 [[id number]]  PHYSICAL EXAM Constitutional: He is awake and alert and in no acute distress.  He is coughing some during the examination.  Eyes: Pupils react EOMI.  Ears nose mouth and throat: His mucous membranes are slightly dry.  His neck is supple.  Hearing is grossly normal.  Throat is clear.  Cardiovascular: His heart is regular with respiratory: His respiratory effort is mildly increased.  He has bilateral crackles but much more on the right side of his lung.  Gastrointestinal: His abdomen is soft with no masses.  Skin: Warm and dry.  Musculoskeletal: Grossly normal strength in the upper and lower extremities bilaterally.  Neurological: No focal abnormalities.  Psychiatric: Normal mood and affect  Lab Results: Basic Metabolic Panel: Recent Labs    01/29/18 0641  01/30/18 0603  NA 141 137  K 3.8 4.0  CL 101 100*  CO2 32 28  GLUCOSE 73 97  BUN 8 12  CREATININE 0.63 0.74  CALCIUM 8.5* 8.3*   Liver Function Tests: Recent Labs    01/28/18 1612 01/29/18 0641  AST 57* 37  ALT 71* 54  ALKPHOS 264* 233*  BILITOT 0.9 0.8  PROT 7.3 7.0  ALBUMIN 2.4* 2.2*   No results for input(s): LIPASE, AMYLASE in the last 72 hours. No results for input(s): AMMONIA in the last 72 hours. CBC: Recent Labs    01/28/18 1612 01/29/18 0641 01/30/18 0603  WBC 16.3* 11.1* 14.6*  NEUTROABS 13.5*  --   --   HGB 14.1 13.8 14.8  HCT 41.5 42.3 46.0  MCV 90.2 94.8 96.2  PLT 328 314 343   Cardiac Enzymes: No results for input(s): CKTOTAL, CKMB, CKMBINDEX, TROPONINI in the last 72 hours. BNP: No results for input(s): PROBNP in the last 72 hours. D-Dimer: No results for input(s): DDIMER in the last 72 hours. CBG: No results for input(s): GLUCAP in the last 72 hours. Hemoglobin A1C: No results for input(s): HGBA1C in the last 72 hours. Fasting Lipid Panel: No results for input(s): CHOL, HDL, LDLCALC, TRIG, CHOLHDL, LDLDIRECT in the last 72 hours. Thyroid Function Tests: No results for input(s): TSH, T4TOTAL, FREET4, T3FREE, THYROIDAB in the last 72 hours. Anemia Panel: No results for input(s): VITAMINB12, FOLATE, FERRITIN, TIBC, IRON, RETICCTPCT in the last 72 hours. Coagulation: No results for input(s): LABPROT, INR in the last 72 hours. Urine Drug Screen: Drugs of Abuse     Component Value Date/Time   LABOPIA NEG 05/07/2009 0114   COCAINSCRNUR NEG 05/07/2009 0114   LABBENZ POS (A) 05/07/2009 0114   AMPHETMU NEG 05/07/2009 0114    Alcohol Level: No results for input(s): ETH in the last 72 hours. Urinalysis: No results for input(s): COLORURINE, LABSPEC, PHURINE, GLUCOSEU, HGBUR, BILIRUBINUR, KETONESUR, PROTEINUR, UROBILINOGEN, NITRITE, LEUKOCYTESUR in the last 72 hours.  Invalid input(s): APPERANCEUR Misc. Labs:   ABGS: No results for  input(s): PHART, PO2ART, TCO2, HCO3 in the last 72 hours.  Invalid input(s): PCO2   MICROBIOLOGY: Recent Results (from the past 240 hour(s))  Blood Culture (routine x 2)     Status: None (Preliminary result)   Collection Time: 01/28/18  4:22 PM  Result Value Ref Range Status   Specimen Description BLOOD  Final   Special Requests NONE  Final   Culture   Final    NO GROWTH 2 DAYS Performed at [facility], [address], [place], [place] [address]    Report Status PENDING  Incomplete  Blood Culture (routine x 2)     Status: None (Preliminary result)  Collection Time: 01/28/18  4:23 PM  Result Value Ref Range Status   Specimen Description BLOOD  Final   Special Requests NONE  Final   Culture   Final    NO GROWTH 2 DAYS Performed at [facility], [address] [place], [place] [address]    Report Status PENDING  Incomplete  MRSA PCR Screening     Status: None   Collection Time: 01/29/18  9:05 PM  Result Value Ref Range Status   MRSA by PCR NEGATIVE NEGATIVE Final    Comment:        The GeneXpert MRSA Assay (FDA approved for NASAL specimens only), is one component of a comprehensive MRSA colonization surveillance program. It is not intended to diagnose MRSA infection nor to guide or monitor treatment for MRSA infections. Performed at [facility], [address] [place], [place] [address]     Studies/Results: Ct Angio Chest Pe W And/or Wo Contrast  Result Date: 01/28/2018 CLINICAL DATA:  57 year old male with acute shortness of breath today. History of lung cancer and pulmonary embolus several weeks ago. EXAM: CT ANGIOGRAPHY CHEST WITH CONTRAST TECHNIQUE: Multidetector CT imaging of the chest was performed using the standard protocol during bolus administration of intravenous contrast. Multiplanar CT image reconstructions and MIPs were obtained to evaluate the vascular anatomy. CONTRAST:  153mL ISOVUE-370 IOPAMIDOL (ISOVUE-370) INJECTION 76% COMPARISON:  None. FINDINGS:  Cardiovascular: This is a technically satisfactory study. No pulmonary emboli are identified. The previously identified embolus within the LEFT LOWER lobe is no longer visualized. Heart is unchanged. A small pericardial effusion again noted. There is no evidence of thoracic aortic aneurysm. Mediastinum/Nodes: Multiple enlarged mediastinal, hilar, and LEFT axillary lymphadenopathy again noted and unchanged. Index lymph nodes include the following: A 1.2 cm high LEFT SUPERIOR mediastinal node (4:29) A 1.5 cm LEFT prevascular node (4:35). A 1.9 cm AP window node (4:47) A 1.5 cm subcarinal node (4:48) A 1 cm LEFT axillary node (4:23). Mediastinal shift to the RIGHT again noted. Mild diffuse esophageal wall thickening is again noted. Lungs/Pleura: New consolidation/airspace disease throughout the posterior RIGHT lung identified likely representing pneumonia. There is a cavitary component to the consolidation in the RIGHT UPPER lung (6:51). Paramedian consolidation/opacity within the RIGHT lung is unchanged compatible with radiation changes. Small amount of ground-glass opacity within the LEFT UPPER lobe (6: 40-47) is nonspecific. A moderate loculated RIGHT basilar pleural effusion is unchanged. Upper Abdomen: Hepatosplenomegaly again noted. No acute abnormality. Mildly enlarged UPPER abdominal lymph nodes are unchanged. Musculoskeletal: No acute abnormality. Review of the MIP images confirms the above findings. IMPRESSION: 1. New consolidation/airspace disease throughout the posterior RIGHT lung likely representing pneumonia. Cavitary component may reflect necrotizing pneumonia. 2. No evidence of pulmonary emboli. LEFT LOWER lobe pulmonary embolus identified on the previous study is no longer visualized. 3. Unchanged thoracic and UPPER abdominal lymphadenopathy, moderate loculated RIGHT basilar pleural effusion and hepatosplenomegaly. Electronically Signed   By: Margarette Canada M.D.   On: 01/28/2018 17:29    Medications:   Prior to Admission:  Medications Prior to Admission  Medication Sig Dispense Refill Last Dose  . albuterol (PROVENTIL HFA;VENTOLIN HFA) 108 (90 Base) MCG/ACT inhaler Inhale 2 puffs into the lungs every 6 (six) hours as needed for wheezing. 1 Inhaler 0 01/28/2018 at Unknown time  . ALPRAZolam (XANAX) 1 MG tablet Take 1 mg by mouth 2 (two) times daily as needed for anxiety or sleep (takes 1 tablet and bedtime for sleep and 1 dose during the day only if needed).  01/28/2018 at Unknown time  . amLODipine (NORVASC) 5 MG tablet Take 5 mg by mouth daily.    01/28/2018 at Unknown time  . apixaban (ELIQUIS) 5 MG TABS tablet Take 1 tablet (5 mg total) by mouth 2 (two) times daily. 60 tablet 2 01/28/2018 at 0600  . Artificial Tear Solution (SOOTHE XP) SOLN Apply 2 drops to eye daily.   01/28/2018 at Unknown time  . Atezolizumab (TECENTRIQ IV) Inject into the vein. Every 3 weeks   Past Month at Unknown time  . escitalopram (LEXAPRO) 20 MG tablet Take 20 mg by mouth daily.    01/28/2018 at Unknown time  . gabapentin (NEURONTIN) 600 MG tablet Take 600 mg by mouth 2 (two) times daily.    01/28/2018 at Unknown time  . guaiFENesin (MUCINEX) 600 MG 12 hr tablet Take 600 mg by mouth 2 (two) times daily as needed (for congestion.).   unknown  . hydrochlorothiazide (MICROZIDE) 12.5 MG capsule Take 12.5 mg by mouth daily.    01/28/2018 at Unknown time  . lactulose (CHRONULAC) 10 GM/15ML solution Take 15 mLs (10 g total) by mouth 3 (three) times daily as needed for mild constipation or moderate constipation. 240 mL 0 unknown  . levofloxacin (LEVAQUIN) 500 MG tablet Take 1 tablet (500 mg total) by mouth daily. 10 tablet 0 01/28/2018 at Unknown time  . lidocaine (XYLOCAINE) 2 % solution Use as directed 20 mLs in the mouth or throat every 6 (six) hours as needed (for mouth pain (used prior to eating when needed)).   unknown  . lidocaine-prilocaine (EMLA) cream Apply a quarter size amount to port site 1 hour prior to chemo. Do not  rub in. Cover with plastic wrap. 30 g 2 unknown  . methadone (DOLOPHINE) 10 MG tablet Take 20 mg by mouth 5 (five) times daily. Takes 2 tablets 5 times daily   01/28/2018 at Unknown time  . mirtazapine (REMERON) 15 MG tablet Take 1 tablet by mouth at bedtime.   01/28/2018 at Unknown time  . Multiple Vitamin (MULTIVITAMIN WITH MINERALS) TABS tablet Take 1 tablet by mouth daily. Centrum   01/28/2018 at Unknown time  . ondansetron (ZOFRAN) 8 MG tablet TAKE 1 TABLET BY MOUTH EVERY 8 HOURS AS NEEDED FOR NAUSEA AND VOMITING. 30 tablet 2 01/28/2018 at Unknown time  . pantoprazole (PROTONIX) 40 MG tablet TAKE (1) TABLET BY MOUTH TWICE DAILY. 60 tablet 3 01/28/2018 at Unknown time  . predniSONE (STERAPRED UNI-PAK 21 TAB) 10 MG (21) TBPK tablet Take as prescribed. 21 tablet 0 01/28/2018 at Unknown time  . prochlorperazine (COMPAZINE) 10 MG tablet TAKE 1 TABLET EVERY 6 HOURS AS NEEDED FOR NAUSEA AND VOMITING. 60 tablet 3 Past Week at Unknown time  . tiZANidine (ZANAFLEX) 4 MG tablet Take 4 mg by mouth every 8 (eight) hours as needed for muscle spasms.    01/28/2018 at Unknown time  . TRELEGY ELLIPTA 100-62.5-25 MCG/INH AEPB    01/28/2018 at Unknown time  . varenicline (CHANTIX) 0.5 MG tablet TAKE (2) TABLETS BY MOUTH TWICE DAILY. 112 tablet 0 01/28/2018 at Unknown time  . albuterol (PROVENTIL HFA;VENTOLIN HFA) 108 (90 Base) MCG/ACT inhaler      . ELIQUIS STARTER PACK (ELIQUIS STARTER PACK) 5 MG TABS Take as directed on package: start with two-5mg  tablets twice daily for 7 days. On day 8, switch to one-5mg  tablet twice daily. (Patient not taking: Reported on 01/29/2018) 1 each 0 Not Taking at Unknown time   Scheduled: . amLODipine  5 mg Oral  Daily  . apixaban  5 mg Oral BID  . escitalopram  20 mg Oral Daily  . gabapentin  600 mg Oral BID  . hydrochlorothiazide  12.5 mg Oral Daily  . methadone  20 mg Oral 5 X Daily  . mirtazapine  15 mg Oral QHS  . mometasone-formoterol  2 puff Inhalation BID  . pantoprazole  40 mg  Oral Daily  . varenicline  1 mg Oral BID   Continuous: . sodium chloride 75 mL/hr at 01/30/18 0603  . ampicillin-sulbactam (UNASYN) IV Stopped (01/30/18 6579)  . vancomycin    . vancomycin Stopped (01/29/18 2235)   UXY:BFXOVANVBT, gi cocktail, guaiFENesin, ipratropium-albuterol, lactulose, ondansetron (ZOFRAN) IV, ondansetron **OR** ondansetron (ZOFRAN) IV, prochlorperazine, tiZANidine  Assesment: He was admitted with healthcare associated pneumonia and acute hypoxic respiratory failure.  CT which I have personally reviewed shows that approximately half of his right lung is involved with the pneumonia.  He says he feels substantially better this morning.  I do not think he needs a bronchoscopy  His acute hypoxic respiratory failure may be resolving as he is now on room air with oxygen saturation in the mid 90s  He has had pulmonary emboli and is on Eliquis  He has adenocarcinoma of the right lung at baseline and he is on chemotherapy so definitely immunocompromised Active Problems:   Anxiety state   GERD   Adenocarcinoma of right lung ([place])   HCAP (healthcare-associated pneumonia)    Plan: Continue current treatments.  Agree with broad-spectrum coverage.  I would get Legionella urinary titers because Legionella can cause cavitary pneumonia in the meantime would add Levaquin until that comes back  Thanks for allowing me to see him with you    LOS: 2 days   Aziyah Provencal L 01/30/2018, 8:42 AM

## 2018-01-30 NOTE — Progress Notes (Signed)
PROGRESS NOTE    Erik [name]  LPF:[id number] DOB: [birth year] DOA: 01/28/2018 PCP: [name], MD     Brief Narrative:  57 year old man admitted from home on 2/23 with complaints of shortness of breath and fever.  He has a history of adenocarcinoma of the lung currently undergoing chemotherapy as well as COPD and history of PE.  He reports a 10-day history of increasing cough with productive sputum, worsening fatigue, decreased energy and lack of appetite.  He was seen by his oncologist and prescribed Levaquin but despite this has become significantly worse and came to the hospital for evaluation.   Assessment & Plan:   Active Problems:   Anxiety state   GERD   Adenocarcinoma of right lung ([place])   HCAP (healthcare-associated pneumonia)   Lobar pneumonia/acute hypoxemic respiratory failure -Failed outpatient treatment with Levaquin. -On CT scan has a cavitary lesion which raises question of staph aureus pneumonia versus anaerobic pathogens. -Given severity of airspace consolidation on CT, pulmonology consult was obtained.  Dr. Luan Pulling does not believe that bronchoscopy is necessary but does believe that  Levaquin needs to be added to cover possibility of Legionella pending urine Legionella antigen. -Vancomycin will be discontinued given negative MRSA PCR, will continue Unasyn to provide anaerobic coverage.   -Influenza PCR is negative. -Culture data is pending. -Wean oxygen as tolerated. -He seems to be overall clinically improving.  Stage IV adenocarcinoma of the lung -Follow-up with oncology as scheduled.  Recently diagnosed PE -Continue Eliquis.  Depression -Continue Lexapro, Remeron  GERD -Continue PPI  Hypertension -Continue hydrochlorothiazide  Tobacco abuse -Continue Chantix as he is currently trying to quit.  Chronic pain syndrome -Continue methadone, Neurontin   DVT prophylaxis: Eliquis Code Status: Full code Family Communication: Patient  only Disposition Plan: Pending medical stability  Consultants:   Pulmonology, Dr. Luan Pulling  Procedures:   None  Antimicrobials:  Anti-infectives (From admission, onward)   Start     Dose/Rate Route Frequency Ordered Stop   01/30/18 1000  levofloxacin (LEVAQUIN) IVPB 750 mg     750 mg 100 mL/hr over 90 Minutes Intravenous Every 24 hours 01/30/18 0910     01/29/18 1400  ceFEPIme (MAXIPIME) 1 g in sodium chloride 0.9 % 100 mL IVPB  Status:  Discontinued     1 g 200 mL/hr over 30 Minutes Intravenous Every 8 hours 01/29/18 0926 01/29/18 1152   01/29/18 1400  Ampicillin-Sulbactam (UNASYN) 3 g in sodium chloride 0.9 % 100 mL IVPB     3 g 200 mL/hr over 30 Minutes Intravenous Every 8 hours 01/29/18 1207     01/29/18 1000  vancomycin (VANCOCIN) IVPB 1000 mg/200 mL premix  Status:  Discontinued     1,000 mg 200 mL/hr over 60 Minutes Intravenous Every 12 hours 01/29/18 0926 01/30/18 0940   01/29/18 0030  ceFEPIme (MAXIPIME) 2 g in sodium chloride 0.9 % 100 mL IVPB     2 g 200 mL/hr over 30 Minutes Intravenous  Once 01/28/18 2344 01/29/18 0251   01/28/18 2345  vancomycin (VANCOCIN) 1,500 mg in sodium chloride 0.9 % 500 mL IVPB  Status:  Discontinued     1,500 mg 250 mL/hr over 120 Minutes Intravenous  Once 01/28/18 2344 01/30/18 0940   01/28/18 1615  ceFEPIme (MAXIPIME) 2 g in sodium chloride 0.9 % 100 mL IVPB     2 g 200 mL/hr over 30 Minutes Intravenous  Once 01/28/18 1608 01/28/18 1717   01/28/18 1615  vancomycin (VANCOCIN) IVPB 1000 mg/200 mL premix  1,000 mg 200 mL/hr over 60 Minutes Intravenous  Once 01/28/18 1608 01/28/18 1837       Subjective: Lying in bed, still feels significantly short of breath although less so than yesterday, has been afebrile states that overall feels improved over past 24 hours.  Objective: Vitals:   01/30/18 0615 01/30/18 0800 01/30/18 0854 01/30/18 1506  BP: 110/87   114/74  Pulse: (!) 101   (!) 118  Resp:      Temp:    98.2 F (36.8 C)   TempSrc:    Oral  SpO2: 95% 94% 92% 95%  Weight:      Height:        Intake/Output Summary (Last 24 hours) at 01/30/2018 1705 Last data filed at 01/30/2018 1507 Gross per 24 hour  Intake 4303.75 ml  Output 1375 ml  Net 2928.75 ml   Filed Weights   01/28/18 1553 01/29/18 0005  Weight: 101.6 kg (224 lb) 95.6 kg (210 lb 12.2 oz)    Examination:  General exam: Alert, awake, oriented x 3 Respiratory system: Decreased breath sounds to right base and mid lung fields. Cardiovascular system:RRR. No murmurs, rubs, gallops. Gastrointestinal system: Abdomen is nondistended, soft and nontender. No organomegaly or masses felt. Normal bowel sounds heard. Central nervous system: Alert and oriented. No focal neurological deficits. Extremities: No C/C/E, +pedal pulses Skin: No rashes, lesions or ulcers Psychiatry: Judgement and insight appear normal. Mood & affect appropriate.      Data Reviewed: I have personally reviewed following labs and imaging studies  CBC: Recent Labs  Lab 01/28/18 1612 01/29/18 0641 01/30/18 0603  WBC 16.3* 11.1* 14.6*  NEUTROABS 13.5*  --   --   HGB 14.1 13.8 14.8  HCT 41.5 42.3 46.0  MCV 90.2 94.8 96.2  PLT 328 314 628   Basic Metabolic Panel: Recent Labs  Lab 01/28/18 1612 01/29/18 0641 01/30/18 0603  NA 135 141 137  K 3.5 3.8 4.0  CL 96* 101 100*  CO2 25 32 28  GLUCOSE 130* 73 97  BUN 8 8 12   CREATININE 0.60* 0.63 0.74  CALCIUM 8.6* 8.5* 8.3*   GFR: Estimated Creatinine Clearance: 125.1 mL/min (by C-G formula based on SCr of 0.74 mg/dL). Liver Function Tests: Recent Labs  Lab 01/28/18 1612 01/29/18 0641  AST 57* 37  ALT 71* 54  ALKPHOS 264* 233*  BILITOT 0.9 0.8  PROT 7.3 7.0  ALBUMIN 2.4* 2.2*   No results for input(s): LIPASE, AMYLASE in the last 168 hours. No results for input(s): AMMONIA in the last 168 hours. Coagulation Profile: No results for input(s): INR, PROTIME in the last 168 hours. Cardiac Enzymes: No results for  input(s): CKTOTAL, CKMB, CKMBINDEX, TROPONINI in the last 168 hours. BNP (last 3 results) No results for input(s): PROBNP in the last 8760 hours. HbA1C: No results for input(s): HGBA1C in the last 72 hours. CBG: No results for input(s): GLUCAP in the last 168 hours. Lipid Profile: No results for input(s): CHOL, HDL, LDLCALC, TRIG, CHOLHDL, LDLDIRECT in the last 72 hours. Thyroid Function Tests: No results for input(s): TSH, T4TOTAL, FREET4, T3FREE, THYROIDAB in the last 72 hours. Anemia Panel: No results for input(s): VITAMINB12, FOLATE, FERRITIN, TIBC, IRON, RETICCTPCT in the last 72 hours. Urine analysis: No results found for: COLORURINE, APPEARANCEUR, LABSPEC, PHURINE, GLUCOSEU, HGBUR, BILIRUBINUR, KETONESUR, PROTEINUR, UROBILINOGEN, NITRITE, LEUKOCYTESUR Sepsis Labs: @LABRCNTIP (procalcitonin:4,lacticidven:4)  ) Recent Results (from the past 240 hour(s))  Blood Culture (routine x 2)     Status: None (Preliminary result)  Collection Time: 01/28/18  4:22 PM  Result Value Ref Range Status   Specimen Description BLOOD RIGHT ARM  Final   Special Requests   Final    BOTTLES DRAWN AEROBIC AND ANAEROBIC Blood Culture adequate volume   Culture   Final    NO GROWTH 2 DAYS Performed at [facility], [address], [place], [place] [address]    Report Status PENDING  Incomplete  Blood Culture (routine x 2)     Status: None (Preliminary result)   Collection Time: 01/28/18  4:23 PM  Result Value Ref Range Status   Specimen Description BLOOD RIGHT HAND  Final   Special Requests   Final    BOTTLES DRAWN AEROBIC AND ANAEROBIC Blood Culture adequate volume   Culture   Final    NO GROWTH 2 DAYS Performed at [facility], [address] [place], [place] [address]    Report Status PENDING  Incomplete  MRSA PCR Screening     Status: None   Collection Time: 01/29/18  9:05 PM  Result Value Ref Range Status   MRSA by PCR NEGATIVE NEGATIVE Final    Comment:        The GeneXpert MRSA Assay  (FDA approved for NASAL specimens only), is one component of a comprehensive MRSA colonization surveillance program. It is not intended to diagnose MRSA infection nor to guide or monitor treatment for MRSA infections. Performed at [facility], [address], [place], [place] [address]   Culture, sputum-assessment     Status: None   Collection Time: 01/30/18  6:00 AM  Result Value Ref Range Status   Specimen Description SPU  Final   Special Requests NONE  Final   Sputum evaluation   Final    Sputum specimen not acceptable for testing.  Please recollect.   CALLED TO L.RUSSELL AT [id number] ON [id number] BY THOMPSON S. Performed at [facility], [address] [place], [place] [address]    Report Status 01/30/2018 FINAL  Final         Radiology Studies: Ct Angio Chest Pe W And/or Wo Contrast  Result Date: 01/28/2018 CLINICAL DATA:  57 year old male with acute shortness of breath today. History of lung cancer and pulmonary embolus several weeks ago. EXAM: CT ANGIOGRAPHY CHEST WITH CONTRAST TECHNIQUE: Multidetector CT imaging of the chest was performed using the standard protocol during bolus administration of intravenous contrast. Multiplanar CT image reconstructions and MIPs were obtained to evaluate the vascular anatomy. CONTRAST:  152mL ISOVUE-370 IOPAMIDOL (ISOVUE-370) INJECTION 76% COMPARISON:  None. FINDINGS: Cardiovascular: This is a technically satisfactory study. No pulmonary emboli are identified. The previously identified embolus within the LEFT LOWER lobe is no longer visualized. Heart is unchanged. A small pericardial effusion again noted. There is no evidence of thoracic aortic aneurysm. Mediastinum/Nodes: Multiple enlarged mediastinal, hilar, and LEFT axillary lymphadenopathy again noted and unchanged. Index lymph nodes include the following: A 1.2 cm high LEFT SUPERIOR mediastinal node (4:29) A 1.5 cm LEFT prevascular node (4:35). A 1.9 cm AP window node (4:47) A 1.5 cm subcarinal  node (4:48) A 1 cm LEFT axillary node (4:23). Mediastinal shift to the RIGHT again noted. Mild diffuse esophageal wall thickening is again noted. Lungs/Pleura: New consolidation/airspace disease throughout the posterior RIGHT lung identified likely representing pneumonia. There is a cavitary component to the consolidation in the RIGHT UPPER lung (6:51). Paramedian consolidation/opacity within the RIGHT lung is unchanged compatible with radiation changes. Small amount of ground-glass opacity within the LEFT UPPER lobe (6: 40-47) is nonspecific.  A moderate loculated RIGHT basilar pleural effusion is unchanged. Upper Abdomen: Hepatosplenomegaly again noted. No acute abnormality. Mildly enlarged UPPER abdominal lymph nodes are unchanged. Musculoskeletal: No acute abnormality. Review of the MIP images confirms the above findings. IMPRESSION: 1. New consolidation/airspace disease throughout the posterior RIGHT lung likely representing pneumonia. Cavitary component may reflect necrotizing pneumonia. 2. No evidence of pulmonary emboli. LEFT LOWER lobe pulmonary embolus identified on the previous study is no longer visualized. 3. Unchanged thoracic and UPPER abdominal lymphadenopathy, moderate loculated RIGHT basilar pleural effusion and hepatosplenomegaly. Electronically Signed   By: Margarette Canada M.D.   On: 01/28/2018 17:29        Scheduled Meds: . amLODipine  5 mg Oral Daily  . apixaban  5 mg Oral BID  . escitalopram  20 mg Oral Daily  . gabapentin  600 mg Oral BID  . hydrochlorothiazide  12.5 mg Oral Daily  . methadone  20 mg Oral 5 X Daily  . mirtazapine  15 mg Oral QHS  . mometasone-formoterol  2 puff Inhalation BID  . pantoprazole  40 mg Oral Daily  . varenicline  1 mg Oral BID   Continuous Infusions: . sodium chloride 75 mL/hr at 01/30/18 0603  . ampicillin-sulbactam (UNASYN) IV Stopped (01/30/18 1530)  . levofloxacin (LEVAQUIN) IV Stopped (01/30/18 1232)     LOS: 2 days    Time spent: 25  minutes. Greater than 50% of this time was spent in direct contact with the patient coordinating care.     Lelon Frohlich, MD [facility] Pager [phone]  If 7PM-7AM, please contact night-coverage [email or web address] Password [facility] 01/30/2018, 5:05 PM

## 2018-01-30 NOTE — Progress Notes (Addendum)
Pharmacy Antibiotic Note  Erik [name] is a 57 y.o. male admitted on 01/28/2018 with pneumonia.  Pharmacy has been consulted for Unasyn dosing.  Cefepime and Vancomycin d/c'd, Levaquin to be added pending legionella antigen.  Plan:  Unasyn 3gm IV q8h Levaquin 750mg  IV q24hrs Monitor labs, renal fxn, progress and c/s Deescalate ABX when improved / appropriate.    Antimicrobials this admission: Vancomycin 2/23 >> 2/25 Cefepime 2/23 >> 2/24 Unasyn 2/24 >> Levaquin 2/25 >>  Dose adjustments this admission:  Microbiology results:  BCx: pending  UCx: pending   Sputum:    MRSA PCR: negative  Legionella antigen: pending  Height: 6' (182.9 cm) Weight: 210 lb 12.2 oz (95.6 kg) IBW/kg (Calculated) : 77.6  Temp (24hrs), Avg:97.8 F (36.6 C), Min:97.8 F (36.6 C), Max:97.8 F (36.6 C)  Recent Labs  Lab 01/23/18 0914 01/28/18 1612 01/28/18 1617 01/29/18 0641 01/30/18 0603  WBC 17.6* 16.3*  --  11.1* 14.6*  CREATININE 0.77 0.60*  --  0.63 0.74  LATICACIDVEN  --   --  1.64  --   --     Estimated Creatinine Clearance: 125.1 mL/min (by C-G formula based on SCr of 0.74 mg/dL).    Allergies  Allergen Reactions  . Demeclocycline Other (See Comments) and Swelling  . Tetracyclines & Related Swelling and Other (See Comments)        Thank you for allowing pharmacy to be a part of this patient's care.  [name] 01/30/2018 9:10 AM

## 2018-01-30 NOTE — Progress Notes (Signed)
Weaned PT down to 1.5L of O2 and O2 sat has been about 92%.

## 2018-01-30 NOTE — Progress Notes (Signed)
PT is now on Room Air and O2 sat has been 94% or great with no s.s of distress

## 2018-01-31 ENCOUNTER — Other Ambulatory Visit (HOSPITAL_COMMUNITY): Payer: Medicare Other

## 2018-01-31 ENCOUNTER — Ambulatory Visit (HOSPITAL_COMMUNITY): Payer: Medicare Other | Admitting: Adult Health

## 2018-01-31 ENCOUNTER — Ambulatory Visit (HOSPITAL_COMMUNITY): Payer: Medicare Other

## 2018-01-31 LAB — LEGIONELLA PNEUMOPHILA SEROGP 1 UR AG: L. pneumophila Serogp 1 Ur Ag: NEGATIVE

## 2018-01-31 NOTE — Progress Notes (Signed)
PROGRESS NOTE    Erik [name]  ALP:[id number] DOB: August 28, 1961 DOA: 01/28/2018 PCP: [name], MD     Brief Narrative:  57 year old man admitted from home on 2/23 with complaints of shortness of breath and fever.  He has a history of adenocarcinoma of the lung currently undergoing chemotherapy as well as COPD and history of PE.  He reports a 10-day history of increasing cough with productive sputum, worsening fatigue, decreased energy and lack of appetite.  He was seen by his oncologist and prescribed Levaquin but despite this has become significantly worse and came to the hospital for evaluation.   Assessment & Plan:   Active Problems:   Anxiety state   GERD   Adenocarcinoma of right lung ([place])   HCAP (healthcare-associated pneumonia)   Lobar pneumonia/acute hypoxemic respiratory failure -Failed outpatient treatment with Levaquin. -On CT scan has a cavitary lesion which raises question of staph aureus pneumonia versus anaerobic pathogens. -Given severity of airspace consolidation on CT, pulmonology consult was obtained.  Dr. Luan Pulling does not believe that bronchoscopy is necessary but does believe that  Levaquin needs to be added to cover possibility of Legionella pending urine Legionella antigen.  2/26: urine Legionella antigen is negative, will DC Levaquin. -Vancomycin has been discontinued given negative MRSA PCR, will continue Unasyn to provide anaerobic coverage.   -Influenza PCR is negative. -Culture data is pending. -Wean oxygen as tolerated. -He seems to be overall clinically improving.  Stage IV adenocarcinoma of the lung -Follow-up with oncology as scheduled.  Recently diagnosed PE -Continue Eliquis.  Depression -Continue Lexapro, Remeron  GERD -Continue PPI  Hypertension -Continue hydrochlorothiazide  Tobacco abuse -Continue Chantix as he is currently trying to quit.  Chronic pain syndrome -Continue methadone, Neurontin   DVT prophylaxis:  Eliquis Code Status: Full code Family Communication: Patient only Disposition Plan: Pending medical stability  Consultants:   Pulmonology, Dr. Luan Pulling  Procedures:   None  Antimicrobials:  Anti-infectives (From admission, onward)   Start     Dose/Rate Route Frequency Ordered Stop   01/30/18 1000  levofloxacin (LEVAQUIN) IVPB 750 mg     750 mg 100 mL/hr over 90 Minutes Intravenous Every 24 hours 01/30/18 0910     01/29/18 1400  ceFEPIme (MAXIPIME) 1 g in sodium chloride 0.9 % 100 mL IVPB  Status:  Discontinued     1 g 200 mL/hr over 30 Minutes Intravenous Every 8 hours 01/29/18 0926 01/29/18 1152   01/29/18 1400  Ampicillin-Sulbactam (UNASYN) 3 g in sodium chloride 0.9 % 100 mL IVPB     3 g 200 mL/hr over 30 Minutes Intravenous Every 8 hours 01/29/18 1207     01/29/18 1000  vancomycin (VANCOCIN) IVPB 1000 mg/200 mL premix  Status:  Discontinued     1,000 mg 200 mL/hr over 60 Minutes Intravenous Every 12 hours 01/29/18 0926 01/30/18 0940   01/29/18 0030  ceFEPIme (MAXIPIME) 2 g in sodium chloride 0.9 % 100 mL IVPB     2 g 200 mL/hr over 30 Minutes Intravenous  Once 01/28/18 2344 01/29/18 0251   01/28/18 2345  vancomycin (VANCOCIN) 1,500 mg in sodium chloride 0.9 % 500 mL IVPB  Status:  Discontinued     1,500 mg 250 mL/hr over 120 Minutes Intravenous  Once 01/28/18 2344 01/30/18 0940   01/28/18 1615  ceFEPIme (MAXIPIME) 2 g in sodium chloride 0.9 % 100 mL IVPB     2 g 200 mL/hr over 30 Minutes Intravenous  Once 01/28/18 1608 01/28/18 1717   01/28/18  1615  vancomycin (VANCOCIN) IVPB 1000 mg/200 mL premix     1,000 mg 200 mL/hr over 60 Minutes Intravenous  Once 01/28/18 1608 01/28/18 1837       Subjective: In bed, feels improved, still weak but less short of breath.  Objective: Vitals:   01/30/18 2115 01/31/18 0500 01/31/18 0818 01/31/18 1405  BP: 107/72 109/77  106/71  Pulse: (!) 117 88  (!) 113  Resp: 18 18  18   Temp: 97.8 F (36.6 C) 97.9 F (36.6 C)  98.1 F  (36.7 C)  TempSrc: Oral Oral    SpO2: 95% 96% 95% 96%  Weight:      Height:        Intake/Output Summary (Last 24 hours) at 01/31/2018 1519 Last data filed at 01/31/2018 1500 Gross per 24 hour  Intake 3201.25 ml  Output 1600 ml  Net 1601.25 ml   Filed Weights   01/28/18 1553 01/29/18 0005  Weight: 101.6 kg (224 lb) 95.6 kg (210 lb 12.2 oz)    Examination:  General exam: Alert, awake, oriented x 3 Respiratory system: Decreased breath sounds to right base and right mid lung fields Cardiovascular system:RRR. No murmurs, rubs, gallops. Gastrointestinal system: Abdomen is nondistended, soft and nontender. No organomegaly or masses felt. Normal bowel sounds heard. Central nervous system: Alert and oriented. No focal neurological deficits. Extremities: No C/C/E, +pedal pulses Skin: No rashes, lesions or ulcers Psychiatry: Judgement and insight appear normal. Mood & affect appropriate.       Data Reviewed: I have personally reviewed following labs and imaging studies  CBC: Recent Labs  Lab 01/28/18 1612 01/29/18 0641 01/30/18 0603  WBC 16.3* 11.1* 14.6*  NEUTROABS 13.5*  --   --   HGB 14.1 13.8 14.8  HCT 41.5 42.3 46.0  MCV 90.2 94.8 96.2  PLT 328 314 277   Basic Metabolic Panel: Recent Labs  Lab 01/28/18 1612 01/29/18 0641 01/30/18 0603  NA 135 141 137  K 3.5 3.8 4.0  CL 96* 101 100*  CO2 25 32 28  GLUCOSE 130* 73 97  BUN 8 8 12   CREATININE 0.60* 0.63 0.74  CALCIUM 8.6* 8.5* 8.3*   GFR: Estimated Creatinine Clearance: 125.1 mL/min (by C-G formula based on SCr of 0.74 mg/dL). Liver Function Tests: Recent Labs  Lab 01/28/18 1612 01/29/18 0641  AST 57* 37  ALT 71* 54  ALKPHOS 264* 233*  BILITOT 0.9 0.8  PROT 7.3 7.0  ALBUMIN 2.4* 2.2*   No results for input(s): LIPASE, AMYLASE in the last 168 hours. No results for input(s): AMMONIA in the last 168 hours. Coagulation Profile: No results for input(s): INR, PROTIME in the last 168 hours. Cardiac  Enzymes: No results for input(s): CKTOTAL, CKMB, CKMBINDEX, TROPONINI in the last 168 hours. BNP (last 3 results) No results for input(s): PROBNP in the last 8760 hours. HbA1C: No results for input(s): HGBA1C in the last 72 hours. CBG: No results for input(s): GLUCAP in the last 168 hours. Lipid Profile: No results for input(s): CHOL, HDL, LDLCALC, TRIG, CHOLHDL, LDLDIRECT in the last 72 hours. Thyroid Function Tests: No results for input(s): TSH, T4TOTAL, FREET4, T3FREE, THYROIDAB in the last 72 hours. Anemia Panel: No results for input(s): VITAMINB12, FOLATE, FERRITIN, TIBC, IRON, RETICCTPCT in the last 72 hours. Urine analysis: No results found for: COLORURINE, APPEARANCEUR, LABSPEC, PHURINE, GLUCOSEU, HGBUR, BILIRUBINUR, KETONESUR, PROTEINUR, UROBILINOGEN, NITRITE, LEUKOCYTESUR Sepsis Labs: @LABRCNTIP (procalcitonin:4,lacticidven:4)  ) Recent Results (from the past 240 hour(s))  Blood Culture (routine x 2)  Status: None (Preliminary result)   Collection Time: 01/28/18  4:22 PM  Result Value Ref Range Status   Specimen Description BLOOD RIGHT ARM  Final   Special Requests   Final    BOTTLES DRAWN AEROBIC AND ANAEROBIC Blood Culture adequate volume   Culture   Final    NO GROWTH 3 DAYS Performed at [facility], [address], [place], [place] [address]    Report Status PENDING  Incomplete  Blood Culture (routine x 2)     Status: None (Preliminary result)   Collection Time: 01/28/18  4:23 PM  Result Value Ref Range Status   Specimen Description BLOOD RIGHT HAND  Final   Special Requests   Final    BOTTLES DRAWN AEROBIC AND ANAEROBIC Blood Culture adequate volume   Culture   Final    NO GROWTH 3 DAYS Performed at [facility], [address], [place], [place] [address]    Report Status PENDING  Incomplete  MRSA PCR Screening     Status: None   Collection Time: 01/29/18  9:05 PM  Result Value Ref Range Status   MRSA by PCR NEGATIVE NEGATIVE Final    Comment:         The GeneXpert MRSA Assay (FDA approved for NASAL specimens only), is one component of a comprehensive MRSA colonization surveillance program. It is not intended to diagnose MRSA infection nor to guide or monitor treatment for MRSA infections. Performed at [facility], [address], [place], [place] [address]   Culture, sputum-assessment     Status: None   Collection Time: 01/30/18  6:00 AM  Result Value Ref Range Status   Specimen Description SPU  Final   Special Requests NONE  Final   Sputum evaluation   Final    Sputum specimen not acceptable for testing.  Please recollect.   CALLED TO L.RUSSELL AT [id number] ON [id number] BY THOMPSON S. Performed at [facility], [address], [place], [place] [address]    Report Status 01/30/2018 FINAL  Final  Culture, expectorated sputum-assessment     Status: None   Collection Time: 01/30/18  3:57 PM  Result Value Ref Range Status   Specimen Description EXPECTORATED SPUTUM  Final   Special Requests NONE  Final   Sputum evaluation   Final    THIS SPECIMEN IS ACCEPTABLE FOR SPUTUM CULTURE PERFORMED AT [facility] Performed at [facility], [address], [place], [place] [address]    Report Status 01/30/2018 FINAL  Final  Culture, respiratory (NON-Expectorated)     Status: None (Preliminary result)   Collection Time: 01/30/18  3:57 PM  Result Value Ref Range Status   Specimen Description   Final    EXPECTORATED SPUTUM Performed at [facility], [address] [place], [place] [address]    Special Requests   Final    NONE Reflexed from [id number] Performed at [facility], [address] [place], [place] [address]    Gram Stain   Final    MODERATE WBC PRESENT, PREDOMINANTLY PMN RARE GRAM POSITIVE COCCI    Culture   Final    CULTURE REINCUBATED FOR BETTER GROWTH Performed at [facility], [place] [address] [place], [place] [address]    Report Status PENDING  Incomplete         Radiology Studies: No results found.      Scheduled  Meds: . amLODipine  5 mg Oral Daily  . apixaban  5 mg Oral BID  . escitalopram  20 mg Oral  Daily  . gabapentin  600 mg Oral BID  . hydrochlorothiazide  12.5 mg Oral Daily  . methadone  20 mg Oral 5 X Daily  . mirtazapine  15 mg Oral QHS  . mometasone-formoterol  2 puff Inhalation BID  . pantoprazole  40 mg Oral Daily  . varenicline  1 mg Oral BID   Continuous Infusions: . sodium chloride 75 mL/hr at 01/30/18 0603  . ampicillin-sulbactam (UNASYN) IV 3 g (01/31/18 1411)  . levofloxacin (LEVAQUIN) IV Stopped (01/31/18 1404)     LOS: 3 days    Time spent: 25 minutes. Greater than 50% of this time was spent in direct contact with the patient coordinating care.     Lelon Frohlich, MD [facility] Pager [phone]  If 7PM-7AM, please contact night-coverage [email or web address] Password [facility] 01/31/2018, 3:18 PM

## 2018-01-31 NOTE — Progress Notes (Signed)
Subjective: He says he feels okay.  He was able to get up and go to the bathroom.  He is still coughing mostly nonproductively.  Objective: Vital signs in last 24 hours: Temp:  [97.8 F (36.6 C)-98.2 F (36.8 C)] 97.9 F (36.6 C) (02/26 0500) Pulse Rate:  [88-118] 88 (02/26 0500) Resp:  [18] 18 (02/26 0500) BP: (107-114)/(72-77) 109/77 (02/26 0500) SpO2:  [92 %-96 %] 96 % (02/26 0500) Weight change:  Last BM Date: 01/26/18  Intake/Output from previous day: 02/25 0701 - 02/26 0700 In: 3178.8 [P.O.:960; I.V.:1768.8; IV Piggyback:450] Out: 1900 [Urine:1900]  PHYSICAL EXAM General appearance: alert, cooperative and no distress Resp: rales Scattered throughout the right lung Cardio: regular rate and rhythm, S1, S2 normal, no murmur, click, rub or gallop GI: soft, non-tender; bowel sounds normal; no masses,  no organomegaly Extremities: extremities normal, atraumatic, no cyanosis or edema  Lab Results:  Results for orders placed or performed during the hospital encounter of 01/28/18 (from the past 48 hour(s))  MRSA PCR Screening     Status: None   Collection Time: 01/29/18  9:05 PM  Result Value Ref Range   MRSA by PCR NEGATIVE NEGATIVE    Comment:        The GeneXpert MRSA Assay (FDA approved for NASAL specimens only), is one component of a comprehensive MRSA colonization surveillance program. It is not intended to diagnose MRSA infection nor to guide or monitor treatment for MRSA infections. Performed at [facility], [address] [place], [place] [address]   Culture, sputum-assessment     Status: None   Collection Time: 01/30/18  6:00 AM  Result Value Ref Range   Specimen Description SPU    Special Requests NONE    Sputum evaluation      Sputum specimen not acceptable for testing.  Please recollect.   CALLED TO L.RUSSELL AT [id number] ON [id number] BY THOMPSON S. Performed at [facility], [address], [place], [place] [address]    Report Status 01/30/2018 FINAL    Basic metabolic panel     Status: Abnormal   Collection Time: 01/30/18  6:03 AM  Result Value Ref Range   Sodium 137 135 - 145 mmol/L   Potassium 4.0 3.5 - 5.1 mmol/L   Chloride 100 (L) 101 - 111 mmol/L   CO2 28 22 - 32 mmol/L   Glucose, Bld 97 65 - 99 mg/dL   BUN 12 6 - 20 mg/dL   Creatinine, Ser 0.74 0.61 - 1.24 mg/dL   Calcium 8.3 (L) 8.9 - 10.3 mg/dL   GFR calc non Af Amer >60 >60 mL/min   GFR calc Af Amer >60 >60 mL/min    Comment: (NOTE) The eGFR has been calculated using the CKD EPI equation. This calculation has not been validated in all clinical situations. eGFR's persistently <60 mL/min signify possible Chronic Kidney Disease.    Anion gap 9 5 - 15    Comment: Performed at [facility], [address] [place], [place] [address]  CBC     Status: Abnormal   Collection Time: 01/30/18  6:03 AM  Result Value Ref Range   WBC 14.6 (H) 4.0 - 10.5 K/uL   RBC 4.78 4.22 - 5.81 MIL/uL   Hemoglobin 14.8 13.0 - 17.0 g/dL   HCT 46.0 39.0 - 52.0 %   MCV 96.2 78.0 - 100.0 fL   MCH 31.0 26.0 - 34.0 pg   MCHC 32.2 30.0 - 36.0 g/dL   RDW 16.0 (H) 11.5 - 15.5 %  Platelets 343 150 - 400 K/uL    Comment: Performed at [facility], [address] [place], [place] [address]  Strep pneumoniae urinary antigen     Status: None   Collection Time: 01/30/18  9:20 AM  Result Value Ref Range   Strep Pneumo Urinary Antigen NEGATIVE NEGATIVE    Comment:        Infection due to S. pneumoniae cannot be absolutely ruled out since the antigen present may be below the detection limit of the test.   Culture, expectorated sputum-assessment     Status: None   Collection Time: 01/30/18  3:57 PM  Result Value Ref Range   Specimen Description EXPECTORATED SPUTUM    Special Requests NONE    Sputum evaluation      THIS SPECIMEN IS ACCEPTABLE FOR SPUTUM CULTURE PERFORMED AT [facility] Performed at [facility], [address] [place], [place] [address]    Report Status 01/30/2018 FINAL   Culture,  respiratory (NON-Expectorated)     Status: None (Preliminary result)   Collection Time: 01/30/18  3:57 PM  Result Value Ref Range   Specimen Description      EXPECTORATED SPUTUM Performed at [facility], [address] [place], [place] [address]    Special Requests      NONE Reflexed from [id number] Performed at [facility], [address], [place], [place] [address]    Gram Stain      MODERATE WBC PRESENT, PREDOMINANTLY PMN RARE GRAM POSITIVE COCCI Performed at [facility], [place] [address] [place], [place] [address]    Culture PENDING    Report Status PENDING     ABGS No results for input(s): PHART, PO2ART, TCO2, HCO3 in the last 72 hours.  Invalid input(s): PCO2 CULTURES Recent Results (from the past 240 hour(s))  Blood Culture (routine x 2)     Status: None (Preliminary result)   Collection Time: 01/28/18  4:22 PM  Result Value Ref Range Status   Specimen Description BLOOD RIGHT ARM  Final   Special Requests   Final    BOTTLES DRAWN AEROBIC AND ANAEROBIC Blood Culture adequate volume   Culture   Final    NO GROWTH 3 DAYS Performed at [facility], [address] [place], [place] [address]    Report Status PENDING  Incomplete  Blood Culture (routine x 2)     Status: None (Preliminary result)   Collection Time: 01/28/18  4:23 PM  Result Value Ref Range Status   Specimen Description BLOOD RIGHT HAND  Final   Special Requests   Final    BOTTLES DRAWN AEROBIC AND ANAEROBIC Blood Culture adequate volume   Culture   Final    NO GROWTH 3 DAYS Performed at [facility], [address], [place], [place] [address]    Report Status PENDING  Incomplete  MRSA PCR Screening     Status: None   Collection Time: 01/29/18  9:05 PM  Result Value Ref Range Status   MRSA by PCR NEGATIVE NEGATIVE Final    Comment:        The GeneXpert MRSA Assay (FDA approved for NASAL specimens only), is one component of a comprehensive MRSA colonization surveillance program. It is  not intended to diagnose MRSA infection nor to guide or monitor treatment for MRSA infections. Performed at [facility], [address], [place],  [address]   Culture, sputum-assessment     Status: None   Collection Time: 01/30/18  6:00 AM  Result Value Ref Range Status  Specimen Description SPU  Final   Special Requests NONE  Final   Sputum evaluation   Final    Sputum specimen not acceptable for testing.  Please recollect.   CALLED TO L.RUSSELL AT [id number] ON [id number] BY THOMPSON S. Performed at [facility], [address] [place], [place] [address]    Report Status 01/30/2018 FINAL  Final  Culture, expectorated sputum-assessment     Status: None   Collection Time: 01/30/18  3:57 PM  Result Value Ref Range Status   Specimen Description EXPECTORATED SPUTUM  Final   Special Requests NONE  Final   Sputum evaluation   Final    THIS SPECIMEN IS ACCEPTABLE FOR SPUTUM CULTURE PERFORMED AT [facility] Performed at [facility], [address], [place], [place] [address]    Report Status 01/30/2018 FINAL  Final  Culture, respiratory (NON-Expectorated)     Status: None (Preliminary result)   Collection Time: 01/30/18  3:57 PM  Result Value Ref Range Status   Specimen Description   Final    EXPECTORATED SPUTUM Performed at [facility], [address], [place], [place] [address]    Special Requests   Final    NONE Reflexed from [id number] Performed at [facility], [address], [place], [place] [address]    Gram Stain   Final    MODERATE WBC PRESENT, PREDOMINANTLY PMN RARE GRAM POSITIVE COCCI Performed at [facility], [place] [address], [place], [place] [address]    Culture PENDING  Incomplete   Report Status PENDING  Incomplete   Studies/Results: No results found.  Medications:  Prior to Admission:  Medications Prior to Admission  Medication Sig Dispense Refill Last Dose  . albuterol (PROVENTIL HFA;VENTOLIN HFA) 108 (90 Base) MCG/ACT inhaler Inhale 2 puffs into the lungs  every 6 (six) hours as needed for wheezing. 1 Inhaler 0 01/28/2018 at Unknown time  . ALPRAZolam (XANAX) 1 MG tablet Take 1 mg by mouth 2 (two) times daily as needed for anxiety or sleep (takes 1 tablet and bedtime for sleep and 1 dose during the day only if needed).    01/28/2018 at Unknown time  . amLODipine (NORVASC) 5 MG tablet Take 5 mg by mouth daily.    01/28/2018 at Unknown time  . apixaban (ELIQUIS) 5 MG TABS tablet Take 1 tablet (5 mg total) by mouth 2 (two) times daily. 60 tablet 2 01/28/2018 at 0600  . Artificial Tear Solution (SOOTHE XP) SOLN Apply 2 drops to eye daily.   01/28/2018 at Unknown time  . Atezolizumab (TECENTRIQ IV) Inject into the vein. Every 3 weeks   Past Month at Unknown time  . escitalopram (LEXAPRO) 20 MG tablet Take 20 mg by mouth daily.    01/28/2018 at Unknown time  . gabapentin (NEURONTIN) 600 MG tablet Take 600 mg by mouth 2 (two) times daily.    01/28/2018 at Unknown time  . guaiFENesin (MUCINEX) 600 MG 12 hr tablet Take 600 mg by mouth 2 (two) times daily as needed (for congestion.).   unknown  . hydrochlorothiazide (MICROZIDE) 12.5 MG capsule Take 12.5 mg by mouth daily.    01/28/2018 at Unknown time  . lactulose (CHRONULAC) 10 GM/15ML solution Take 15 mLs (10 g total) by mouth 3 (three) times daily as needed for mild constipation or moderate constipation. 240 mL 0 unknown  . levofloxacin (LEVAQUIN) 500 MG tablet Take 1 tablet (500 mg total) by mouth daily. 10 tablet 0 01/28/2018 at Unknown time  . lidocaine (XYLOCAINE) 2 % solution  Use as directed 20 mLs in the mouth or throat every 6 (six) hours as needed (for mouth pain (used prior to eating when needed)).   unknown  . lidocaine-prilocaine (EMLA) cream Apply a quarter size amount to port site 1 hour prior to chemo. Do not rub in. Cover with plastic wrap. 30 g 2 unknown  . methadone (DOLOPHINE) 10 MG tablet Take 20 mg by mouth 5 (five) times daily. Takes 2 tablets 5 times daily   01/28/2018 at Unknown time  .  mirtazapine (REMERON) 15 MG tablet Take 1 tablet by mouth at bedtime.   01/28/2018 at Unknown time  . Multiple Vitamin (MULTIVITAMIN WITH MINERALS) TABS tablet Take 1 tablet by mouth daily. Centrum   01/28/2018 at Unknown time  . ondansetron (ZOFRAN) 8 MG tablet TAKE 1 TABLET BY MOUTH EVERY 8 HOURS AS NEEDED FOR NAUSEA AND VOMITING. 30 tablet 2 01/28/2018 at Unknown time  . pantoprazole (PROTONIX) 40 MG tablet TAKE (1) TABLET BY MOUTH TWICE DAILY. 60 tablet 3 01/28/2018 at Unknown time  . predniSONE (STERAPRED UNI-PAK 21 TAB) 10 MG (21) TBPK tablet Take as prescribed. 21 tablet 0 01/28/2018 at Unknown time  . prochlorperazine (COMPAZINE) 10 MG tablet TAKE 1 TABLET EVERY 6 HOURS AS NEEDED FOR NAUSEA AND VOMITING. 60 tablet 3 Past Week at Unknown time  . tiZANidine (ZANAFLEX) 4 MG tablet Take 4 mg by mouth every 8 (eight) hours as needed for muscle spasms.    01/28/2018 at Unknown time  . TRELEGY ELLIPTA 100-62.5-25 MCG/INH AEPB    01/28/2018 at Unknown time  . varenicline (CHANTIX) 0.5 MG tablet TAKE (2) TABLETS BY MOUTH TWICE DAILY. 112 tablet 0 01/28/2018 at Unknown time  . albuterol (PROVENTIL HFA;VENTOLIN HFA) 108 (90 Base) MCG/ACT inhaler      . ELIQUIS STARTER PACK (ELIQUIS STARTER PACK) 5 MG TABS Take as directed on package: start with two-44m tablets twice daily for 7 days. On day 8, switch to one-537mtablet twice daily. (Patient not taking: Reported on 01/29/2018) 1 each 0 Not Taking at Unknown time   Scheduled: . amLODipine  5 mg Oral Daily  . apixaban  5 mg Oral BID  . escitalopram  20 mg Oral Daily  . gabapentin  600 mg Oral BID  . hydrochlorothiazide  12.5 mg Oral Daily  . methadone  20 mg Oral 5 X Daily  . mirtazapine  15 mg Oral QHS  . mometasone-formoterol  2 puff Inhalation BID  . pantoprazole  40 mg Oral Daily  . varenicline  1 mg Oral BID   Continuous: . sodium chloride 75 mL/hr at 01/30/18 0603  . ampicillin-sulbactam (UNASYN) IV Stopped (01/31/18 0618)  . levofloxacin (LEVAQUIN)  IV Stopped (01/30/18 1232)   PRTMA:[id number] cocktail, guaiFENesin, ipratropium-albuterol, lactulose, ondansetron (ZOFRAN) IV, ondansetron **OR** ondansetron (ZOFRAN) IV, prochlorperazine, tiZANidine  Assesment: He was admitted with healthcare associated pneumonia.  He has extensive right lung infiltrate.  He is improving.  He has adenocarcinoma of the right lung and has been receiving chemotherapy so is immunocompromised  He has recent history of pulmonary embolism but clot is gone on CT Active Problems:   Anxiety state   GERD   [place]  HCAP (healthcare-associated pneumonia)    Plan: Continue treatments.  He has gram-positive cocci on Gram stain but strep pneumo urinary antigen is negative.  Legionella urinary antigen is pending.    LOS: 3 days   Vyla Pint L 01/31/2018, 7:48 AM

## 2018-02-01 DIAGNOSIS — J9601 Acute respiratory failure with hypoxia: Secondary | ICD-10-CM

## 2018-02-01 MED ORDER — HEPARIN SOD (PORK) LOCK FLUSH 100 UNIT/ML IV SOLN: 500.0000 [IU] | INTRAVENOUS | Status: DC | PRN

## 2018-02-01 MED ORDER — HEPARIN SOD (PORK) LOCK FLUSH 100 UNIT/ML IV SOLN
500.0000 [IU] | INTRAVENOUS | Status: DC
  Administered 2018-02-01: 500 [IU]
  Filled 2018-02-01: qty 5

## 2018-02-01 MED ORDER — AMOXICILLIN-POT CLAVULANATE 875-125 MG PO TABS: 1.0000 | ORAL_TABLET | Freq: Two times a day (BID) | ORAL | 0 refills | Status: AC

## 2018-02-01 MED ORDER — AMOXICILLIN-POT CLAVULANATE 875-125 MG PO TABS
1.0000 | ORAL_TABLET | Freq: Two times a day (BID) | ORAL | Status: DC
  Administered 2018-02-01: 1 via ORAL
  Filled 2018-02-01: qty 1

## 2018-02-01 NOTE — Care Management Important Message (Signed)
Important Message  Patient Details  Name: KABIR BRANNOCK MRN: [id number] Date of Birth: 1961/04/28   Medicare Important Message Given:  Yes    Sherald Barge, RN 02/01/2018, 11:59 AM

## 2018-02-01 NOTE — Progress Notes (Signed)
Patient discharged home with instructions given on medications,and follow up visits,patient verbalized understanding. Prescriptions sent with patient.IV discontinued,catheter intact. Accompanied by staff to an awaiting vehicle. 

## 2018-02-01 NOTE — Progress Notes (Addendum)
Subjective: He was admitted with healthcare associated pneumonia.  He has lung cancer and he is undergoing treatment for that.  He is on Unasyn and has been on Legionella coverage with Levaquin but his Legionella antigen is negative so we can stop the Levaquin now.  He says he feels better.  Objective: Vital signs in last 24 hours: Temp:  [98.1 F (36.7 C)-99 F (37.2 C)] 98.3 F (36.8 C) (02/27 0500) Pulse Rate:  [106-116] 106 (02/27 0500) Resp:  [18] 18 (02/27 0500) BP: (101-121)/(70-96) 121/96 (02/27 0500) SpO2:  [93 %-96 %] 93 % (02/27 0500) Weight change:  Last BM Date: 01/28/18  Intake/Output from previous day: 02/26 0701 - 02/27 0700 In: 1645 [P.O.:720; I.V.:675; IV Piggyback:250] Out: 1625 [Urine:1625]  PHYSICAL EXAM General appearance: alert, cooperative and no distress Resp: Rhonchi right greater than left Cardio: regular rate and rhythm, S1, S2 normal, no murmur, click, rub or gallop GI: soft, non-tender; bowel sounds normal; no masses,  no organomegaly Extremities: extremities normal, atraumatic, no cyanosis or edema Skin warm and dry  Lab Results:  Results for orders placed or performed during the hospital encounter of 01/28/18 (from the past 48 hour(s))  Legionella Pneumophila Serogp 1 Ur Ag     Status: None   Collection Time: 01/30/18  8:49 AM  Result Value Ref Range   L. pneumophila Serogp 1 Ur Ag Negative Negative    Comment: (NOTE) Presumptive negative for L. pneumophila serogroup 1 antigen in urine, suggesting no recent or current infection. Legionnaires' disease cannot be ruled out since other serogroups and species may also cause disease. Performed At: [facility] [place], [place] [id number] Rush Farmer MD [id number]    Source of Sample URINE, CLEAN CATCH     Comment: Performed at [facility], [address] [place], [place] [address]  Strep pneumoniae urinary antigen     Status: None   Collection Time: 01/30/18  9:20  AM  Result Value Ref Range   Strep Pneumo Urinary Antigen NEGATIVE NEGATIVE    Comment:        Infection due to S. pneumoniae cannot be absolutely ruled out since the antigen present may be below the detection limit of the test.   Culture, expectorated sputum-assessment     Status: None   Collection Time: 01/30/18  3:57 PM  Result Value Ref Range   Specimen Description EXPECTORATED SPUTUM    Special Requests NONE    Sputum evaluation      THIS SPECIMEN IS ACCEPTABLE FOR SPUTUM CULTURE PERFORMED AT [facility] Performed at [facility], [address], [place], [place] [address]    Report Status 01/30/2018 FINAL   Culture, respiratory (NON-Expectorated)     Status: None (Preliminary result)   Collection Time: 01/30/18  3:57 PM  Result Value Ref Range   Specimen Description      EXPECTORATED SPUTUM Performed at [facility], [address], [place], [place] [address]    Special Requests      NONE Reflexed from [id number] Performed at [facility], [address], [place], [place] [address]    Gram Stain      MODERATE WBC PRESENT, PREDOMINANTLY PMN RARE GRAM POSITIVE COCCI    Culture      CULTURE REINCUBATED FOR BETTER GROWTH Performed at [facility], [place] [address], [place], [place] [address]    Report Status PENDING     ABGS No results for input(s): PHART, PO2ART, TCO2, HCO3 in the last 72 hours.  Invalid  input(s): PCO2 CULTURES Recent Results (from the past 240 hour(s))  Blood Culture (routine x 2)     Status: None (Preliminary result)   Collection Time: 01/28/18  4:22 PM  Result Value Ref Range Status   Specimen Description BLOOD RIGHT ARM  Final   Special Requests   Final    BOTTLES DRAWN AEROBIC AND ANAEROBIC Blood Culture adequate volume   Culture   Final    NO GROWTH 3 DAYS Performed at [facility], [address] [place], [place] [address]    Report Status PENDING  Incomplete  Blood Culture (routine x 2)     Status: None (Preliminary result)   Collection  Time: 01/28/18  4:23 PM  Result Value Ref Range Status   Specimen Description BLOOD RIGHT HAND  Final   Special Requests   Final    BOTTLES DRAWN AEROBIC AND ANAEROBIC Blood Culture adequate volume   Culture   Final    NO GROWTH 3 DAYS Performed at [facility], [address], [place], [place] [address]    Report Status PENDING  Incomplete  MRSA PCR Screening     Status: None   Collection Time: 01/29/18  9:05 PM  Result Value Ref Range Status   MRSA by PCR NEGATIVE NEGATIVE Final    Comment:        The GeneXpert MRSA Assay (FDA approved for NASAL specimens only), is one component of a comprehensive MRSA colonization surveillance program. It is not intended to diagnose MRSA infection nor to guide or monitor treatment for MRSA infections. Performed at [facility], [address], [place], [place] [address]   Culture, sputum-assessment     Status: None   Collection Time: 01/30/18  6:00 AM  Result Value Ref Range Status   Specimen Description SPU  Final   Special Requests NONE  Final   Sputum evaluation   Final    Sputum specimen not acceptable for testing.  Please recollect.   CALLED TO L.RUSSELL AT [id number] ON [id number] BY THOMPSON S. Performed at [facility], [address] [place], [place] [address]    Report Status 01/30/2018 FINAL  Final  Culture, expectorated sputum-assessment     Status: None   Collection Time: 01/30/18  3:57 PM  Result Value Ref Range Status   Specimen Description EXPECTORATED SPUTUM  Final   Special Requests NONE  Final   Sputum evaluation   Final    THIS SPECIMEN IS ACCEPTABLE FOR SPUTUM CULTURE PERFORMED AT [facility] Performed at [facility], [address], [place], [place] [address]    Report Status 01/30/2018 FINAL  Final  Culture, respiratory (NON-Expectorated)     Status: None (Preliminary result)   Collection Time: 01/30/18  3:57 PM  Result Value Ref Range Status   Specimen Description   Final    EXPECTORATED SPUTUM Performed at [facility], [address] [place], [place] [address]    Special Requests   Final    NONE Reflexed from [id number] Performed at [facility], [address], [place], [place] [address]    Gram Stain   Final    MODERATE WBC PRESENT, PREDOMINANTLY PMN RARE GRAM POSITIVE COCCI    Culture   Final    CULTURE REINCUBATED FOR BETTER GROWTH Performed at [facility], [place] [address] [place], [place] [address]    Report Status PENDING  Incomplete   Studies/Results: No results found.  Medications:  Prior to Admission:  Medications Prior to Admission  Medication Sig Dispense Refill  Last Dose  . albuterol (PROVENTIL HFA;VENTOLIN HFA) 108 (90 Base) MCG/ACT inhaler Inhale 2 puffs into the lungs every 6 (six) hours as needed for wheezing. 1 Inhaler 0 01/28/2018 at Unknown time  . ALPRAZolam (XANAX) 1 MG tablet Take 1 mg by mouth 2 (two) times daily as needed for anxiety or sleep (takes 1 tablet and bedtime for sleep and 1 dose during the day only if needed).    01/28/2018 at Unknown time  . amLODipine (NORVASC) 5 MG tablet Take 5 mg by mouth daily.    01/28/2018 at Unknown time  . apixaban (ELIQUIS) 5 MG TABS tablet Take 1 tablet (5 mg total) by mouth 2 (two) times daily. 60 tablet 2 01/28/2018 at 0600  . Artificial Tear Solution (SOOTHE XP) SOLN Apply 2 drops to eye daily.   01/28/2018 at Unknown time  . Atezolizumab (TECENTRIQ IV) Inject into the vein. Every 3 weeks   Past Month at Unknown time  . escitalopram (LEXAPRO) 20 MG tablet Take 20 mg by mouth daily.    01/28/2018 at Unknown time  . gabapentin (NEURONTIN) 600 MG tablet Take 600 mg by mouth 2 (two) times daily.    01/28/2018 at Unknown time  . guaiFENesin (MUCINEX) 600 MG 12 hr tablet Take 600 mg by mouth 2 (two) times daily as needed (for congestion.).   unknown  . hydrochlorothiazide (MICROZIDE) 12.5 MG capsule Take 12.5 mg by mouth daily.    01/28/2018 at Unknown time  . lactulose (CHRONULAC) 10 GM/15ML solution Take 15 mLs (10 g total) by mouth 3  (three) times daily as needed for mild constipation or moderate constipation. 240 mL 0 unknown  . levofloxacin (LEVAQUIN) 500 MG tablet Take 1 tablet (500 mg total) by mouth daily. 10 tablet 0 01/28/2018 at Unknown time  . lidocaine (XYLOCAINE) 2 % solution Use as directed 20 mLs in the mouth or throat every 6 (six) hours as needed (for mouth pain (used prior to eating when needed)).   unknown  . lidocaine-prilocaine (EMLA) cream Apply a quarter size amount to port site 1 hour prior to chemo. Do not rub in. Cover with plastic wrap. 30 g 2 unknown  . methadone (DOLOPHINE) 10 MG tablet Take 20 mg by mouth 5 (five) times daily. Takes 2 tablets 5 times daily   01/28/2018 at Unknown time  . mirtazapine (REMERON) 15 MG tablet Take 1 tablet by mouth at bedtime.   01/28/2018 at Unknown time  . Multiple Vitamin (MULTIVITAMIN WITH MINERALS) TABS tablet Take 1 tablet by mouth daily. Centrum   01/28/2018 at Unknown time  . ondansetron (ZOFRAN) 8 MG tablet TAKE 1 TABLET BY MOUTH EVERY 8 HOURS AS NEEDED FOR NAUSEA AND VOMITING. 30 tablet 2 01/28/2018 at Unknown time  . pantoprazole (PROTONIX) 40 MG tablet TAKE (1) TABLET BY MOUTH TWICE DAILY. 60 tablet 3 01/28/2018 at Unknown time  . predniSONE (STERAPRED UNI-PAK 21 TAB) 10 MG (21) TBPK tablet Take as prescribed. 21 tablet 0 01/28/2018 at Unknown time  . prochlorperazine (COMPAZINE) 10 MG tablet TAKE 1 TABLET EVERY 6 HOURS AS NEEDED FOR NAUSEA AND VOMITING. 60 tablet 3 Past Week at Unknown time  . tiZANidine (ZANAFLEX) 4 MG tablet Take 4 mg by mouth every 8 (eight) hours as needed for muscle spasms.    01/28/2018 at Unknown time  . TRELEGY ELLIPTA 100-62.5-25 MCG/INH AEPB    01/28/2018 at Unknown time  . varenicline (CHANTIX) 0.5 MG tablet TAKE (2) TABLETS BY MOUTH TWICE DAILY. 112 tablet 0 01/28/2018  at Unknown time  . albuterol (PROVENTIL HFA;VENTOLIN HFA) 108 (90 Base) MCG/ACT inhaler      . ELIQUIS STARTER PACK (ELIQUIS STARTER PACK) 5 MG TABS Take as directed on  package: start with two-5mg  tablets twice daily for 7 days. On day 8, switch to one-5mg  tablet twice daily. (Patient not taking: Reported on 01/29/2018) 1 each 0 Not Taking at Unknown time   Scheduled: . amLODipine  5 mg Oral Daily  . apixaban  5 mg Oral BID  . escitalopram  20 mg Oral Daily  . gabapentin  600 mg Oral BID  . hydrochlorothiazide  12.5 mg Oral Daily  . methadone  20 mg Oral 5 X Daily  . mirtazapine  15 mg Oral QHS  . mometasone-formoterol  2 puff Inhalation BID  . pantoprazole  40 mg Oral Daily  . varenicline  1 mg Oral BID   Continuous: . sodium chloride 75 mL/hr at 01/31/18 1656  . ampicillin-sulbactam (UNASYN) IV 3 g (02/01/18 0516)   PRF:[id number], gi cocktail, guaiFENesin, ipratropium-albuterol, lactulose, ondansetron (ZOFRAN) IV, ondansetron **OR** ondansetron (ZOFRAN) IV, prochlorperazine, tiZANidine  Assesment: He has healthcare associated pneumonia and he is improving.  Okay to discontinue Levaquin which has already been done.  He I think is approaching discharge from a pulmonary point of view.  He has adenocarcinoma of the right lung and is being treated for that so he is immunocompromised  He has COPD at baseline and that is being treated with nebulizers Active Problems:   Anxiety state   GERD   Adenocarcinoma of right lung ([place])   HCAP (healthcare-associated pneumonia)    Plan: Continue treatments.  If he is able to be discharged I would send him home on Augmentin for 7 more days.  He will need follow-up chest x-ray    LOS: 4 days   Ladene Allocca L 02/01/2018, 7:55 AM

## 2018-02-01 NOTE — Discharge Instructions (Signed)
Healthcare-Associated Pneumonia Healthcare-associated pneumonia is a lung infection that a person can get when in a health care setting or during certain procedures. The infection causes air sacs inside the lungs to fill with pus or fluid. Healthcare-associated pneumonia is usually caused by bacteria that are common in health care settings. These bacteria may be resistant to some antibiotic medicines. What are the causes? This condition is caused by bacteria that get into your lungs. You can get this condition if you:  Breathe in droplets from an infected person's cough or sneeze.  Touch something that an infected person coughed or sneezed on and then touch your mouth, nose, or eyes.  Have a bacterial infection somewhere else in your body, if the bacteria spread to your lungs through your blood.  What increases the risk? This condition is more likely to develop in people who:  Have a disease that weakens their body's defense system (immune system) or their ability to cough out germs.  Are older than age 56.  Having trouble swallowing.  Use a feeding or breathing tube.  Have a cold or the flu.  Have an IV tube inserted in a vein.  Have surgery.  Have a bed sore.  Live in a long-term care facility, such as a nursing home.  Were in the hospital for two or more days in the past 3 months.  Received hemodialysis in the past 30 days.  What are the signs or symptoms? Symptoms of this condition include:  Fever.  Chills.  Cough.  Shortness of breath.  Wheezing or crackling sounds when breathing.  How is this diagnosed? This condition may be diagnosed based on:  Your symptoms.  A chest X-ray.  A measurement of the amount of oxygen in your blood.  How is this treated? This condition is treated with antibiotics. Your health care provider may take a sample of cells (culture) from your throat to determine what type of bacteria is in your lungs and change your antibiotic  based on the results. If you have bacteria in your blood, trouble breathing, or a low oxygen level, you may need to be treated at the hospital. At the hospital, you will be given antibiotics through an IV tube. You may also be given oxygen or breathing treatments. Follow these instructions at home: Medicine  Take your antibiotic medicine as told by your health care provider. Do not stop taking the antibiotic even if you start to feel better.  Take over-the-counter and other prescription medicines only as told by your health care provider. Activity  Rest at home until you feel better.  Return to your normal activities as told by your health care provider. Ask your health care provider what activities are safe for you. General instructions  Drink enough fluid to keep your urine clear or pale yellow.  Do not use any products that contain nicotine or tobacco, such as cigarettes and e-cigarettes. If you need help quitting, ask your health care provider.  Limit alcohol intake to no more than 1 drink per day for nonpregnant women and 2 drinks per day for men. One drink equals 12 oz of beer, 5 oz of wine, or 1 oz of hard liquor.  Keep all follow-up visits as told by your health care provider. This is important. How is this prevented? Actions that I can take To lower your risk of getting this condition again:  Do not smoke. This includes e-cigarettes.  Do not drink too much alcohol.  Keep your immune system healthy  by eating well and getting enough sleep.  Get a flu shot every year (annually).  Get a pneumonia vaccination if: ? You are older than age 56. ? You smoke. ? You have a long-lasting condition like lung disease.  Exercise your lungs by taking deep breaths, walking, and using an incentive spirometer as directed.  Wash your hands often with soap and water. If you cannot get to a sink to wash your hands, use an alcohol-based hand cleaner.  Make sure your health care providers  are washing their hands. If you do not see them wash their hands, ask them to do so.  When you are in a health care facility, avoid touching your eyes, nose, and mouth.  Avoid touching any surface near where people have coughed or sneezed.  Stand away from sick people when they are coughing or sneezing.  Wear a mask if you cannot avoid exposure to people who are sick.  Clean all surfaces often with a disinfectant cleaner, especially if someone is sick at home or work.  Precautions of my health care team Hospitals, nursing homes, and other health care facilities take special care to try to prevent healthcare-associated pneumonia. To do this, your health care team may:  Clean their hands with soap and water or with alcohol-based hand sanitizer before and after seeing patients.  Wear gloves or masks during treatment.  Sanitize medical instruments, tubes, other equipment, and surfaces in patient rooms.  Raise (elevate) the head of your hospital bed so you are not lying flat. The head of the bed may be elevated 30 degrees or more.  Have you sit up and move around as soon as possible after surgery.  Only insert a breathing tube if needed.  Do these things for you if you have a breathing tube: ? Clean the inside of your mouth regularly. ? Remove the breathing tube as soon as it is no longer needed.  Contact a health care provider if:  Your symptoms do not get better or they get worse.  Your symptoms come back after you have finished taking your antibiotics. Get help right away if:  You have trouble breathing.  You have confusion or difficulty thinking. This information is not intended to replace advice given to you by your health care provider. Make sure you discuss any questions you have with your health care provider. Document Released: 04/13/2016 Document Revised: 09/07/2016 Document Reviewed: 08/20/2016 Elsevier Interactive Patient Education  [employer].

## 2018-02-01 NOTE — Progress Notes (Signed)
SATURATION QUALIFICATIONS: (This note is used to comply with regulatory documentation for home oxygen)  Patient Saturations on Room Air at Rest = 94%  Patient Saturations on Room Air while Ambulating = 94%  Patient Saturations on 0 Liters of oxygen while Ambulating = 94%  Please briefly explain why patient needs home oxygen:

## 2018-02-01 NOTE — Discharge Summary (Addendum)
Physician Discharge Summary  Erik [name] ERX:[id number] DOB: June 23, 1961 DOA: 01/28/2018  PCP: Erik Gravel, MD  Admit date: 01/28/2018 Discharge date: 02/01/2018  Admitted From: Home Disposition: Home  Recommendations for Outpatient Follow-up:  1. Follow-up with PCP in 1 week. need follow-up chest x-ray in about 4 weeks to ensure resolution. 2. Patient will be discharged on 7 more days of antibiotic course (Augmentin) on 02/08/2018.  Patient will have received total 14 days of antibiotics from 2/22-3/6.   Home Health: None Equipment/Devices: None  Discharge Condition: Fair CODE STATUS: Full code Diet recommendation: Regular    Discharge Diagnoses:  Principal Problem:   HCAP (healthcare-associated pneumonia)  Active Problems: SIRS Acute respiratory failure with hypoxia (HCC)   Anxiety state   GERD   Adenocarcinoma of right lung ([name]) COPD Pulmonary embolism   Brief narrative/HPI Please refer to admission H&P for details, in brief, 57 year old male with history of adenocarcinoma of the lung currently undergoing chemotherapy, COPD and recently diagnosed PE started on eliquis presented to the ED with 10-day history of progressive cough with productive sputum, fatigue and poor appetite.  He was seen by his oncologist who prescribed Levaquin without improvement and patient came to the ED. Patient found to have acute respiratory failure with hypoxia and lobar pneumonia.  Hospital course  Acute respiratory failure with hypoxia ([place]) Healthcare associated pneumonia Systemic inflammatory response syndrome (SIRS) Failed outpatient treatment with Levaquin.  CT scan on admission showed cavitary lesion concerning for staph aureus pneumonia versus anaerobic infection.  Pulmonary consulted who recommended bronchoscopy was not necessary and placed on empiric Levaquin to cover for possible Legionella.  Urine Legionella antigen was negative so Levaquin was discontinued and switched to Unasyn.   Influenza PCR was negative as well. Blood cultures negative with [name] culture growing rare gram-positive cocci. Pulmonary recommended switching patient to oral Augmentin for 7 more days (patient will receive total of 14 days of antibiotic). -We will assess home O2 needs prior to discharge.  Continue home inhalers and antitussives.  Stage IV adenocarcinoma of the lung On chemotherapy.  Follows with Erik [name].  Recently diagnosed pulmonary embolism Continue Eliquis.  Essential hypertension Stable.  Continue amlodipine and HCTZ.  Tobacco abuse Continue Chantix.  Patient working on quitting.  Chronic pain syndrome Continue methadone and Neurontin.  GERD Continue PPI  Depression Continue Lexapro and Remeron.  Procedure: CT chest Consults: Pulmonary   disposition: Home  Family medication: None at bedside Discharge Instructions   Allergies as of 02/01/2018      Reactions   Demeclocycline Other (See Comments), Swelling   Tetracyclines & Related Swelling, Other (See Comments)         Medication List    STOP taking these medications   levofloxacin 500 MG tablet Commonly known as:  LEVAQUIN   predniSONE 10 MG (21) Tbpk tablet Commonly known as:  STERAPRED UNI-PAK 21 TAB     TAKE these medications   albuterol 108 (90 Base) MCG/ACT inhaler Commonly known as:  PROVENTIL HFA;VENTOLIN HFA Inhale 2 puffs into the lungs every 6 (six) hours as needed for wheezing. What changed:  Another medication with the same name was removed. Continue taking this medication, and follow the directions you see here.   ALPRAZolam 1 MG tablet Commonly known as:  XANAX Take 1 mg by mouth 2 (two) times daily as needed for anxiety or sleep (takes 1 tablet and bedtime for sleep and 1 dose during the day only if needed).   amLODipine 5 MG tablet Commonly known  as:  NORVASC Take 5 mg by mouth daily.   amoxicillin-clavulanate 875-125 MG tablet Commonly known as:  AUGMENTIN Take 1 tablet  by mouth every 12 (twelve) hours for 7 days.   apixaban 5 MG Tabs tablet Commonly known as:  ELIQUIS Take 1 tablet (5 mg total) by mouth 2 (two) times daily. What changed:  Another medication with the same name was removed. Continue taking this medication, and follow the directions you see here.   escitalopram 20 MG tablet Commonly known as:  LEXAPRO Take 20 mg by mouth daily.   gabapentin 600 MG tablet Commonly known as:  NEURONTIN Take 600 mg by mouth 2 (two) times daily.   guaiFENesin 600 MG 12 hr tablet Commonly known as:  MUCINEX Take 600 mg by mouth 2 (two) times daily as needed (for congestion.).   hydrochlorothiazide 12.5 MG capsule Commonly known as:  MICROZIDE Take 12.5 mg by mouth daily.   lactulose 10 GM/15ML solution Commonly known as:  CHRONULAC Take 15 mLs (10 g total) by mouth 3 (three) times daily as needed for mild constipation or moderate constipation.   lidocaine 2 % solution Commonly known as:  XYLOCAINE Use as directed 20 mLs in the mouth or throat every 6 (six) hours as needed (for mouth pain (used prior to eating when needed)).   lidocaine-prilocaine cream Commonly known as:  EMLA Apply a quarter size amount to port site 1 hour prior to chemo. Do not rub in. Cover with plastic wrap.   methadone 10 MG tablet Commonly known as:  DOLOPHINE Take 20 mg by mouth 5 (five) times daily. Takes 2 tablets 5 times daily   mirtazapine 15 MG tablet Commonly known as:  REMERON Take 1 tablet by mouth at bedtime.   multivitamin with minerals Tabs tablet Take 1 tablet by mouth daily. Centrum   ondansetron 8 MG tablet Commonly known as:  ZOFRAN TAKE 1 TABLET BY MOUTH EVERY 8 HOURS AS NEEDED FOR NAUSEA AND VOMITING.   pantoprazole 40 MG tablet Commonly known as:  PROTONIX TAKE (1) TABLET BY MOUTH TWICE DAILY.   prochlorperazine 10 MG tablet Commonly known as:  COMPAZINE TAKE 1 TABLET EVERY 6 HOURS AS NEEDED FOR NAUSEA AND VOMITING.   SOOTHE XP Soln Apply  2 drops to eye daily.   TECENTRIQ IV Inject into the vein. Every 3 weeks   tiZANidine 4 MG tablet Commonly known as:  ZANAFLEX Take 4 mg by mouth every 8 (eight) hours as needed for muscle spasms.   TRELEGY ELLIPTA 100-62.5-25 MCG/INH Aepb Generic drug:  Fluticasone-Umeclidin-Vilant   varenicline 0.5 MG tablet Commonly known as:  CHANTIX TAKE (2) TABLETS BY MOUTH TWICE DAILY.      Follow-up Information    Erik Gravel, MD. Schedule an appointment as soon as possible for a visit in 1 week(s).   Specialty:  Internal Medicine Contact information: [address] [place] [address] [phone]          Allergies  Allergen Reactions  . Demeclocycline Other (See Comments) and Swelling  . Tetracyclines & Related Swelling and Other (See Comments)             Procedures/Studies: Dg Chest 2 View  Result Date: 01/23/2018 CLINICAL DATA:  Wheezing and shortness of breath. History of lung carcinoma on the right EXAM: CHEST  2 VIEW COMPARISON:  Chest radiograph December 26, 2017 and chest CT December 26, 2017 FINDINGS: There is widespread airspace consolidation throughout the right lung with volume loss. There  is a right pleural effusion with areas of collapse primarily in the right upper lobe but also involving right lower lobe. Left lung is hyperexpanded but clear. Heart size is normal. Pulmonary vascularity is distorted on the right appears normal on the left. No adenopathy is appreciable. Port-A-Cath tip is in the superior vena cava. No pneumothorax. No blastic or lytic bone lesions. IMPRESSION: Consolidation with volume loss throughout much of the right lung. There is pleural fluid on the right, better seen on recent CT. Left lung hyperexpanded but clear. Cardiac silhouette within normal limits. There is overall increased opacity on the right compared to recent prior studies. Electronically Signed   By: Lowella Grip III M.D.   On: 01/23/2018 10:56   Ct Angio  Chest Pe W And/or Wo Contrast  Result Date: 01/28/2018 CLINICAL DATA:  57 year old male with acute shortness of breath today. History of lung cancer and pulmonary embolus several weeks ago. EXAM: CT ANGIOGRAPHY CHEST WITH CONTRAST TECHNIQUE: Multidetector CT imaging of the chest was performed using the standard protocol during bolus administration of intravenous contrast. Multiplanar CT image reconstructions and MIPs were obtained to evaluate the vascular anatomy. CONTRAST:  151mL ISOVUE-370 IOPAMIDOL (ISOVUE-370) INJECTION 76% COMPARISON:  None. FINDINGS: Cardiovascular: This is a technically satisfactory study. No pulmonary emboli are identified. The previously identified embolus within the LEFT LOWER lobe is no longer visualized. Heart is unchanged. A small pericardial effusion again noted. There is no evidence of thoracic aortic aneurysm. Mediastinum/Nodes: Multiple enlarged mediastinal, hilar, and LEFT axillary lymphadenopathy again noted and unchanged. Index lymph nodes include the following: A 1.2 cm high LEFT SUPERIOR mediastinal node (4:29) A 1.5 cm LEFT prevascular node (4:35). A 1.9 cm AP window node (4:47) A 1.5 cm subcarinal node (4:48) A 1 cm LEFT axillary node (4:23). Mediastinal shift to the RIGHT again noted. Mild diffuse esophageal wall thickening is again noted. Lungs/Pleura: New consolidation/airspace disease throughout the posterior RIGHT lung identified likely representing pneumonia. There is a cavitary component to the consolidation in the RIGHT UPPER lung (6:51). Paramedian consolidation/opacity within the RIGHT lung is unchanged compatible with radiation changes. Small amount of ground-glass opacity within the LEFT UPPER lobe (6: 40-47) is nonspecific. A moderate loculated RIGHT basilar pleural effusion is unchanged. Upper Abdomen: Hepatosplenomegaly again noted. No acute abnormality. Mildly enlarged UPPER abdominal lymph nodes are unchanged. Musculoskeletal: No acute abnormality. Review  of the MIP images confirms the above findings. IMPRESSION: 1. New consolidation/airspace disease throughout the posterior RIGHT lung likely representing pneumonia. Cavitary component may reflect necrotizing pneumonia. 2. No evidence of pulmonary emboli. LEFT LOWER lobe pulmonary embolus identified on the previous study is no longer visualized. 3. Unchanged thoracic and UPPER abdominal lymphadenopathy, moderate loculated RIGHT basilar pleural effusion and hepatosplenomegaly. Electronically Signed   By: Margarette Canada M.D.   On: 01/28/2018 17:29      Subjective: Reports breathing to be better.  Cough improved.  Discharge Exam: Vitals:   02/01/18 0500 02/01/18 0801  BP: (!) 121/96   Pulse: (!) 106   Resp: 18   Temp: 98.3 F (36.8 C)   SpO2: 93% 94%   Vitals:   01/31/18 2045 01/31/18 2100 02/01/18 0500 02/01/18 0801  BP:  101/70 (!) 121/96   Pulse:  (!) 116 (!) 106   Resp:  18 18   Temp:  99 F (37.2 C) 98.3 F (36.8 C)   TempSrc:  Oral Oral   SpO2: 95% 93% 93% 94%  Weight:      Height:  General: Middle-aged male not in distress HEENT: Moist mucosa, supple neck Chest: Right-sided Port-A-Cath, clear to auscultation bilaterally, no added sounds CVS: Normal S1 and S2, no murmur rub or gallop GI: Soft, nondistended, nontender Musculoskeletal: Warm, no edema     The results of significant diagnostics from this hospitalization (including imaging, microbiology, ancillary and laboratory) are listed below for reference.     Microbiology: Recent Results (from the past 240 hour(s))  Blood Culture (routine x 2)     Status: None (Preliminary result)   Collection Time: 01/28/18  4:22 PM  Result Value Ref Range Status   Specimen Description BLOOD RIGHT ARM  Final   Special Requests   Final    BOTTLES DRAWN AEROBIC AND ANAEROBIC Blood Culture adequate volume   Culture   Final    NO GROWTH 4 DAYS Performed at [facility], [address] [place], [place] [address]    Report  Status PENDING  Incomplete  Blood Culture (routine x 2)     Status: None (Preliminary result)   Collection Time: 01/28/18  4:23 PM  Result Value Ref Range Status   Specimen Description BLOOD RIGHT HAND  Final   Special Requests   Final    BOTTLES DRAWN AEROBIC AND ANAEROBIC Blood Culture adequate volume   Culture   Final    NO GROWTH 4 DAYS Performed at [facility], [address], [place], [place] [address]    Report Status PENDING  Incomplete  MRSA PCR Screening     Status: None   Collection Time: 01/29/18  9:05 PM  Result Value Ref Range Status   MRSA by PCR NEGATIVE NEGATIVE Final    Comment:        The GeneXpert MRSA Assay (FDA approved for NASAL specimens only), is one component of a comprehensive MRSA colonization surveillance program. It is not intended to diagnose MRSA infection nor to guide or monitor treatment for MRSA infections. Performed at [facility], [address], [place], [place] [address]   Culture, sputum-assessment     Status: None   Collection Time: 01/30/18  6:00 AM  Result Value Ref Range Status   Specimen Description SPU  Final   Special Requests NONE  Final   Sputum evaluation   Final    Sputum specimen not acceptable for testing.  Please recollect.   CALLED TO L.RUSSELL AT [id number] ON [id number] BY THOMPSON S. Performed at [facility], [address] [place], [place] [address]    Report Status 01/30/2018 FINAL  Final  Culture, expectorated sputum-assessment     Status: None   Collection Time: 01/30/18  3:57 PM  Result Value Ref Range Status   Specimen Description EXPECTORATED SPUTUM  Final   Special Requests NONE  Final   Sputum evaluation   Final    THIS SPECIMEN IS ACCEPTABLE FOR SPUTUM CULTURE PERFORMED AT [facility] Performed at [facility], [address] [place], [place] [address]    Report Status 01/30/2018 FINAL  Final  Culture, respiratory (NON-Expectorated)     Status: None (Preliminary result)   Collection Time: 01/30/18  3:57 PM  Result  Value Ref Range Status   Specimen Description   Final    EXPECTORATED SPUTUM Performed at [facility], [address], [place], [place] [address]    Special Requests   Final    NONE Reflexed from [id number] Performed at [facility], [address], [place], [place] [address]    Gram Stain   Final    MODERATE WBC PRESENT,  PREDOMINANTLY PMN RARE GRAM POSITIVE COCCI    Culture   Final    CULTURE REINCUBATED FOR BETTER GROWTH Performed at [facility], [place] [address], [place], [place] [address]    Report Status PENDING  Incomplete     Labs: BNP (last 3 results) No results for input(s): BNP in the last 8760 hours. Basic Metabolic Panel: Recent Labs  Lab 01/28/18 1612 01/29/18 0641 01/30/18 0603  NA 135 141 137  K 3.5 3.8 4.0  CL 96* 101 100*  CO2 25 32 28  GLUCOSE 130* 73 97  BUN 8 8 12   CREATININE 0.60* 0.63 0.74  CALCIUM 8.6* 8.5* 8.3*   Liver Function Tests: Recent Labs  Lab 01/28/18 1612 01/29/18 0641  AST 57* 37  ALT 71* 54  ALKPHOS 264* 233*  BILITOT 0.9 0.8  PROT 7.3 7.0  ALBUMIN 2.4* 2.2*   No results for input(s): LIPASE, AMYLASE in the last 168 hours. No results for input(s): AMMONIA in the last 168 hours. CBC: Recent Labs  Lab 01/28/18 1612 01/29/18 0641 01/30/18 0603  WBC 16.3* 11.1* 14.6*  NEUTROABS 13.5*  --   --   HGB 14.1 13.8 14.8  HCT 41.5 42.3 46.0  MCV 90.2 94.8 96.2  PLT 328 314 343   Cardiac Enzymes: No results for input(s): CKTOTAL, CKMB, CKMBINDEX, TROPONINI in the last 168 hours. BNP: Invalid input(s): POCBNP CBG: No results for input(s): GLUCAP in the last 168 hours. D-Dimer No results for input(s): DDIMER in the last 72 hours. Hgb A1c No results for input(s): HGBA1C in the last 72 hours. Lipid Profile No results for input(s): CHOL, HDL, LDLCALC, TRIG, CHOLHDL, LDLDIRECT in the last 72 hours. Thyroid function studies No results for input(s): TSH, T4TOTAL, T3FREE, THYROIDAB in the last 72 hours.  Invalid input(s):  FREET3 Anemia work up No results for input(s): VITAMINB12, FOLATE, FERRITIN, TIBC, IRON, RETICCTPCT in the last 72 hours. Urinalysis No results found for: COLORURINE, APPEARANCEUR, [place], [place], GLUCOSEU, [place], [place], [place], [place], UROBILINOGEN, NITRITE, LEUKOCYTESUR Sepsis Labs Invalid input(s): PROCALCITONIN,  WBC,  LACTICIDVEN Microbiology Recent Results (from the past 240 hour(s))  Blood Culture (routine x 2)     Status: None (Preliminary result)   Collection Time: 01/28/18  4:22 PM  Result Value Ref Range Status   Specimen Description BLOOD RIGHT ARM  Final   Special Requests   Final    BOTTLES DRAWN AEROBIC AND ANAEROBIC Blood Culture adequate volume   Culture   Final    NO GROWTH 4 DAYS Performed at [facility], [address] [place], [place] [address]    Report Status PENDING  Incomplete  Blood Culture (routine x 2)     Status: None (Preliminary result)   Collection Time: 01/28/18  4:23 PM  Result Value Ref Range Status   Specimen Description BLOOD RIGHT HAND  Final   Special Requests   Final    BOTTLES DRAWN AEROBIC AND ANAEROBIC Blood Culture adequate volume   Culture   Final    NO GROWTH 4 DAYS Performed at [facility], [address], [place],  [address]    Report Status PENDING  Incomplete  MRSA PCR Screening     Status: None   Collection Time: 01/29/18  9:05 PM  Result Value Ref Range Status   MRSA by PCR NEGATIVE NEGATIVE Final    Comment:        The GeneXpert MRSA Assay (FDA approved for NASAL specimens only), is one component of a comprehensive MRSA  colonization surveillance program. It is not intended to diagnose MRSA infection nor to guide or monitor treatment for MRSA infections. Performed at [facility], [address], [place], [place] [address]   Culture, sputum-assessment     Status: None   Collection Time: 01/30/18  6:00 AM  Result Value Ref Range Status   Specimen Description SPU  Final   Special Requests NONE   Final   Sputum evaluation   Final    Sputum specimen not acceptable for testing.  Please recollect.   CALLED TO L.RUSSELL AT [id number] ON [id number] BY THOMPSON S. Performed at [facility], [address], [place], [place] [address]    Report Status 01/30/2018 FINAL  Final  Culture, expectorated sputum-assessment     Status: None   Collection Time: 01/30/18  3:57 PM  Result Value Ref Range Status   Specimen Description EXPECTORATED SPUTUM  Final   Special Requests NONE  Final   Sputum evaluation   Final    THIS SPECIMEN IS ACCEPTABLE FOR SPUTUM CULTURE PERFORMED AT [facility] Performed at [facility], [address], [place], [place] [address]    Report Status 01/30/2018 FINAL  Final  Culture, respiratory (NON-Expectorated)     Status: None (Preliminary result)   Collection Time: 01/30/18  3:57 PM  Result Value Ref Range Status   Specimen Description   Final    EXPECTORATED SPUTUM Performed at [facility], [address] [place], [place] [address]    Special Requests   Final    NONE Reflexed from [id number] Performed at [facility], [address], [place], [place] [address]    Gram Stain   Final    MODERATE WBC PRESENT, PREDOMINANTLY PMN RARE GRAM POSITIVE COCCI    Culture   Final    CULTURE REINCUBATED FOR BETTER GROWTH Performed at [facility], [place] [address] [place], [place] [address]    Report Status PENDING  Incomplete     Time coordinating discharge: Over 30 minutes  SIGNED:   Louellen Molder, MD  [facility] 02/01/2018, 11:25 AM Pager   If 7PM-7AM, please contact night-coverage [email or web address] Password [email or web address]

## 2018-02-02 LAB — CULTURE, RESPIRATORY: CULTURE: NORMAL

## 2018-02-02 LAB — CULTURE, BLOOD (ROUTINE X 2)
Culture: NO GROWTH
Culture: NO GROWTH
Special Requests: ADEQUATE
Special Requests: ADEQUATE

## 2018-02-02 LAB — CULTURE, RESPIRATORY W GRAM STAIN

## 2018-02-03 ENCOUNTER — Other Ambulatory Visit (HOSPITAL_COMMUNITY): Payer: Self-pay | Admitting: Adult Health

## 2018-02-03 DIAGNOSIS — K219 Gastro-esophageal reflux disease without esophagitis: Secondary | ICD-10-CM

## 2018-02-03 DIAGNOSIS — C3491 Malignant neoplasm of unspecified part of right bronchus or lung: Secondary | ICD-10-CM

## 2018-02-06 ENCOUNTER — Telehealth (HOSPITAL_COMMUNITY): Payer: Self-pay | Admitting: Emergency Medicine

## 2018-02-06 NOTE — Telephone Encounter (Signed)
Pt called and stated that he was still feeling bad from being discharged with pneumonia.  Still have SOB.  Doesn't think that he has gotten over the pneumonia.  Told pt he would have to go back to the ER to be evaluated.  He wanted to be sent back to the 3 rd floor where he was.  I explained to the patient that we did not direct admit from the cancer center and he would need to be re-evaluated by the ER.  Pt verbalized understanding but does not want to go back to the ER.

## 2018-02-09 ENCOUNTER — Inpatient Hospital Stay (HOSPITAL_COMMUNITY): Payer: Medicare Other

## 2018-02-09 ENCOUNTER — Other Ambulatory Visit: Payer: Self-pay

## 2018-02-09 ENCOUNTER — Inpatient Hospital Stay (HOSPITAL_COMMUNITY): Payer: Medicare Other | Attending: Internal Medicine | Admitting: Internal Medicine

## 2018-02-09 ENCOUNTER — Encounter (HOSPITAL_COMMUNITY): Payer: Self-pay

## 2018-02-09 ENCOUNTER — Encounter (HOSPITAL_COMMUNITY): Payer: Self-pay | Admitting: Internal Medicine

## 2018-02-09 VITALS — BP 116/94 | HR 106 | Temp 98.4°F | Resp 18 | Wt 209.5 lb

## 2018-02-09 DIAGNOSIS — F329 Major depressive disorder, single episode, unspecified: Secondary | ICD-10-CM | POA: Diagnosis not present

## 2018-02-09 DIAGNOSIS — K7460 Unspecified cirrhosis of liver: Secondary | ICD-10-CM | POA: Insufficient documentation

## 2018-02-09 DIAGNOSIS — C3491 Malignant neoplasm of unspecified part of right bronchus or lung: Secondary | ICD-10-CM

## 2018-02-09 DIAGNOSIS — Z7901 Long term (current) use of anticoagulants: Secondary | ICD-10-CM

## 2018-02-09 DIAGNOSIS — J449 Chronic obstructive pulmonary disease, unspecified: Secondary | ICD-10-CM | POA: Insufficient documentation

## 2018-02-09 DIAGNOSIS — M1990 Unspecified osteoarthritis, unspecified site: Secondary | ICD-10-CM | POA: Insufficient documentation

## 2018-02-09 DIAGNOSIS — Z79899 Other long term (current) drug therapy: Secondary | ICD-10-CM | POA: Insufficient documentation

## 2018-02-09 DIAGNOSIS — I10 Essential (primary) hypertension: Secondary | ICD-10-CM | POA: Insufficient documentation

## 2018-02-09 DIAGNOSIS — Z86711 Personal history of pulmonary embolism: Secondary | ICD-10-CM | POA: Diagnosis not present

## 2018-02-09 DIAGNOSIS — C3431 Malignant neoplasm of lower lobe, right bronchus or lung: Secondary | ICD-10-CM | POA: Diagnosis not present

## 2018-02-09 DIAGNOSIS — F17210 Nicotine dependence, cigarettes, uncomplicated: Secondary | ICD-10-CM | POA: Diagnosis not present

## 2018-02-09 DIAGNOSIS — J90 Pleural effusion, not elsewhere classified: Secondary | ICD-10-CM | POA: Insufficient documentation

## 2018-02-09 DIAGNOSIS — K219 Gastro-esophageal reflux disease without esophagitis: Secondary | ICD-10-CM | POA: Insufficient documentation

## 2018-02-09 LAB — COMPREHENSIVE METABOLIC PANEL
ALT: 39 U/L (ref 17–63)
AST: 56 U/L — AB (ref 15–41)
Albumin: 2.5 g/dL — ABNORMAL LOW (ref 3.5–5.0)
Alkaline Phosphatase: 206 U/L — ABNORMAL HIGH (ref 38–126)
Anion gap: 10 (ref 5–15)
BILIRUBIN TOTAL: 0.8 mg/dL (ref 0.3–1.2)
BUN: 9 mg/dL (ref 6–20)
CO2: 30 mmol/L (ref 22–32)
CREATININE: 0.69 mg/dL (ref 0.61–1.24)
Calcium: 8.6 mg/dL — ABNORMAL LOW (ref 8.9–10.3)
Chloride: 96 mmol/L — ABNORMAL LOW (ref 101–111)
GFR calc Af Amer: >60 mL/min (ref >60)
Glucose, Bld: 134 mg/dL — ABNORMAL HIGH (ref 65–99)
Potassium: 3.6 mmol/L (ref 3.5–5.1)
Sodium: 136 mmol/L (ref 135–145)
TOTAL PROTEIN: 8.2 g/dL — AB (ref 6.5–8.1)

## 2018-02-09 LAB — LACTATE DEHYDROGENASE: LDH: 141 U/L (ref 98–192)

## 2018-02-09 LAB — CBC WITH DIFFERENTIAL/PLATELET
Basophils Absolute: 0 K/uL (ref 0.0–0.1)
Basophils Relative: 0 %
Eosinophils Absolute: 0 K/uL (ref 0.0–0.7)
Eosinophils Relative: 0 %
HCT: 43.3 % (ref 39.0–52.0)
Hemoglobin: 13.9 g/dL (ref 13.0–17.0)
Lymphocytes Relative: 17 %
Lymphs Abs: 1.2 K/uL (ref 0.7–4.0)
MCH: 30.7 pg (ref 26.0–34.0)
MCHC: 32.1 g/dL (ref 30.0–36.0)
MCV: 95.6 fL (ref 78.0–100.0)
Monocytes Absolute: 0.3 K/uL (ref 0.1–1.0)
Monocytes Relative: 4 %
Neutro Abs: 5.7 K/uL (ref 1.7–7.7)
Neutrophils Relative %: 79 %
Platelets: 203 K/uL (ref 150–400)
RBC: 4.53 MIL/uL (ref 4.22–5.81)
RDW: 15.3 % (ref 11.5–15.5)
WBC: 7.2 K/uL (ref 4.0–10.5)

## 2018-02-09 MED ORDER — LEVOFLOXACIN 500 MG PO TABS: 500.0000 mg | ORAL_TABLET | Freq: Every day | ORAL | 0 refills | Status: DC

## 2018-02-09 NOTE — Patient Instructions (Signed)
[place] at [facility] Discharge Instructions   You were seen today by Dr. Zoila Shutter We will contact Dr. Luan Pulling office about continuing the inhaler. She is going to give you a 5 day cycle of Levaquin, she wants you to stop taking the augmentin.  Return in 1 week for follow up and treatment.   Thank you for choosing [place] at [facility] to provide your oncology and hematology care.  To afford each patient quality time with our provider, please arrive at least 15 minutes before your scheduled appointment time.    If you have a lab appointment with the [facility] please come in thru the  Main Entrance and check in at the main information desk  You need to re-schedule your appointment should you arrive 10 or more minutes late.  We strive to give you quality time with our providers, and arriving late affects you and other patients whose appointments are after yours.  Also, if you no show three or more times for appointments you may be dismissed from the clinic at the providers discretion.     Again, thank you for choosing [facility].  Our hope is that these requests will decrease the amount of time that you wait before being seen by our physicians.       _____________________________________________________________  Should you have questions after your visit to [facility], please contact our office at [phone] between the hours of 8:30 a.m. and 4:30 p.m.  Voicemails left after 4:30 p.m. will not be returned until the following business day.  For prescription refill requests, have your pharmacy contact our office.       Resources For Cancer Patients and their Caregivers ? American Cancer Society: Can assist with transportation, wigs, general needs, runs Look Good Feel Better.        504-641-2218 ? Cancer Care: Provides financial assistance, online support groups, medication/co-pay assistance.   1-800-813-HOPE 602 090 2062) ? [place] [place] Assists [place] cancer patients and their families through emotional , educational and financial support.  8104988331 ? [place] DSS Where to apply for food stamps, Medicaid and utility assistance. (202) 113-6733 ? [facility]: Transportation to medical appointments. 769 178 8246 ? Social Security Administration: May apply for disability if have a Stage IV cancer. 872-733-4762 (604) 746-8883 ? [employer], Disability and Transit Services: Assists with nutrition, care and transit needs. [place] Support Programs:   > Cancer Support Group  2nd Tuesday of the month 1pm-2pm, Journey Room   > Creative Journey  3rd Tuesday of the month 1130am-1pm, Journey Room

## 2018-02-09 NOTE — Patient Instructions (Signed)
[place] at [facility] Discharge Instructions  Chemo tx held today per MD. Follow-up as scheduled. Call clinic for any questions or concerns   Thank you for choosing [place] at [facility] to provide your oncology and hematology care.  To afford each patient quality time with our provider, please arrive at least 15 minutes before your scheduled appointment time.   If you have a lab appointment with the [facility] please come in thru the  Main Entrance and check in at the main information desk  You need to re-schedule your appointment should you arrive 10 or more minutes late.  We strive to give you quality time with our providers, and arriving late affects you and other patients whose appointments are after yours.  Also, if you no show three or more times for appointments you may be dismissed from the clinic at the providers discretion.     Again, thank you for choosing [facility].  Our hope is that these requests will decrease the amount of time that you wait before being seen by our physicians.       _____________________________________________________________  Should you have questions after your visit to [facility], please contact our office at [phone] between the hours of 8:30 a.m. and 4:30 p.m.  Voicemails left after 4:30 p.m. will not be returned until the following business day.  For prescription refill requests, have your pharmacy contact our office.       Resources For Cancer Patients and their Caregivers ? American Cancer Society: Can assist with transportation, wigs, general needs, runs Look Good Feel Better.        586 038 5307 ? Cancer Care: Provides financial assistance, online support groups, medication/co-pay assistance.  1-800-813-HOPE 970-784-7766) ? [place] Assists [place] cancer patients and their families through emotional , educational and financial  support.  (430)093-4599 ? [place] DSS Where to apply for food stamps, Medicaid and utility assistance. 715-626-5363 ? [facility]: Transportation to medical appointments. 602-871-9056 ? Social Security Administration: May apply for disability if have a Stage IV cancer. 506-540-3292 (250) 399-4947 ? [employer], Disability and Transit Services: Assists with nutrition, care and transit needs. [place] Support Programs:   > Cancer Support Group  2nd Tuesday of the month 1pm-2pm, Journey Room   > Creative Journey  3rd Tuesday of the month 1130am-1pm, Journey Room

## 2018-02-09 NOTE — Progress Notes (Signed)
Labs reviewed with and pt seen by Dr. Walden Field today. Tecentriq infusion to be held today and rescheduled for next week per MD order. Levaquin script ordered for pt as well. Pt discharged self ambulatory in stable condition

## 2018-02-09 NOTE — Progress Notes (Signed)
Diagnosis Adenocarcinoma of right lung ([place]) - Plan: CBC with Differential/Platelet, Comprehensive metabolic panel, Lactate dehydrogenase  Staging Cancer Staging Adenocarcinoma of right lung [facility]) Staging form: Lung, AJCC 7th Edition - Clinical stage from 07/23/2016: Stage IIIA (T2b, N2, M0) - Signed by Baird Cancer, PA-C on 07/23/2016 - Clinical stage from 07/28/2017: Stage IV (M1a) - Signed by Holley Bouche, NP on 07/28/2017   Assessment and Plan: 1.  Stage IV non-small cell lung cancer.  The patient is here today for Tecentriq.  He was recently hospitalized and still has some chest congestion.  CT Angio of the chest that was done 01/28/2018 showed consolidation but no PE.  CT angio of the chest that was done on 12/26/2017 shows small PE in the left lower lobe with emphysematous changes.  There is also a right pleural effusion.  CT of the abdomen and pelvis done 12/28/2017 shows no evidence of metastatic disease.  There is evidence of liver cirrhosis.    Treatment C9 will be held today.  He is prescribed Levaquin 500 mg p.o. daily for 5 days.  Will contact Dr. Luan Pulling concerning his ongoing respiratory issues.  He will return to clinic in 1 week and if symptoms have resolved will proceed with therapy.  He will be seen  in 3 weeks  for his evaluation prior to C10 of Tecentriq.  2.  Chest congestion.  He had recent CT angio the chest on 01/28/2018 that showed consolidation but no PE.  He reports he is on Trelegy  that was prescribed by Dr. Luan Pulling.  He is given a prescription for Levaquin 500 mg a day for 5 days.  He should continue to follow with Dr. Luan Pulling as recommended.  3.  PE.  He is currently on Eliquis.  He had a recent CT angio the chest on 01/28/2018 that showed no PE.  Will continue therapy and revaluate need for ongoing therapy.    4.  Emphysema.  This was noted on recent imaging.  He should continue to follow with Dr. Luan Pulling as recommended.  Interval History:  57 y.o.  presenting to the [place] for continued clinical monitoring while receiving palliative systemic immunotherapy with Gildardo Pounds which was started in August 2018 for diagnosis of adenocarcinoma of the right lung, stage IV (T3 N2 M1a).    Current Status: Patient is here today for follow-up prior to Tecentriq.    He reports ongoing cough and congestion.        Adenocarcinoma of right lung ([place])   07/01/2016 PET scan    6.3 x 3.7 cm right lower lobe mass, suspicious for primary bronchogenic neoplasm, as described above. Hypermetabolic thoracic nodal metastases, as above. Additional right perihilar hypermetabolism, indeterminate. Associated right middle lobe atelectasis/collapse.      07/19/2016 Procedure    Bronchoscopy with brushings and biopsies and endobronchial ultrasound with mediastinal lymph node aspirations by Dr. Roxan Hockey      07/21/2016 Pathology Results    Lung, biopsy, Right Middle Lobe - LUNG TISSUE WITH SQUAMOUS METAPLASIA. - NO MALIGNANCY IDENTIFIED.      07/21/2016 Pathology Results    FINE NEEDLE ASPIRATION, ENDOSCOPIC (A) LEVEL 7 (SPECIMEN 1 OF 3, COLLECTED ON 07/19/16): MALIGNANT CELLS CONSISTENT WITH ADENOCARCINOMA.      07/21/2016 Pathology Results    FINE NEEDLE ASPIRATION, EBUS, 4R, B (SPECIMEN 2 OF 3, COLLECTED 07/19/16): MALIGNANT CELLS CONSISTENT WITH ADENOCARCINOMA.      07/21/2016 Pathology Results    FINE NEEDLE ASPIRATION, EBUS, BRUSHING, RIGHT MIDDLE LOBE, D (  SPECIMEN 3 OF 3, COLLECTED 07/19/16): MALIGNANT CELLS CONSISTENT WITH ADENOCARCINOMA.      07/22/2016 Imaging    MRI brain- No evidence of intracranial metastases.      08/17/2016 - 09/20/2016 Chemotherapy    The patient had palonosetron (ALOXI) injection 0.25 mg, 0.25 mg, Intravenous,  Once, 1 of 1 cycle  CISplatin (PLATINOL) 123 mg in sodium chloride 0.9 % 500 mL chemo infusion, 50 mg/m2 = 123 mg, Intravenous,  Once, 1 of 1 cycle  etoposide (VEPESID) 120 mg in sodium chloride 0.9 % 500 mL  chemo infusion, 50 mg/m2 = 120 mg, Intravenous,  Once, 1 of 1 cycle  fosaprepitant (EMEND) 150 mg, dexamethasone (DECADRON) 12 mg in sodium chloride 0.9 % 145 mL IVPB, , Intravenous,  Once, 1 of 1 cycle  ondansetron (ZOFRAN) 8 mg in sodium chloride 0.9 % 50 mL IVPB, , Intravenous,  Once, 2 of 6 cycles  for chemotherapy treatment.        08/17/2016 -  Radiation Therapy         11/15/2016 Imaging    CT CAP- Interval response to therapy. The previously demonstrated mediastinal and right hilar adenopathy and resulting right middle lobe atelectasis have all improved. 2. No discrete residual lung masses are identified. There is new multifocal ground-glass opacity within the right lower lobe, and to a lesser extent in the left upper lobe. These are probably inflammatory/treatment related. 3. No evidence of abdominopelvic metastatic disease. Stable prominent lymph nodes in the upper abdomen, likely reactive. 4. Decompressed mid SVC without specific signs of SVC occlusion.      12/02/2016 -  Chemotherapy    Imfinzi (durvalumab) immunotherapy every 2 weeks x up to 1 year      01/03/2017 Procedure    EGD by Dr. [name], colonoscopy aborted as "patient forgot to take other half of preparation). Esophagitis. likely radiation-induced ?Dilated.  Erythematous mucosa in the stomach. Biopsied. Normal duodenal bulb and second portion of the duodenum.      02/14/2017 Imaging    CT chest- 1. Development of a small to moderate right-sided pleural effusion with minimal loculation anteriorly. 2. Worsened right-sided aeration with right middle and lower lobe consolidation. As this has a geographic distribution, this could be radiation induced or represent infection. Depending on clinical concern of progressive disease, thoracentesis and/or PET may be informative. 3. Development of mild right paratracheal adenopathy, most likely reactive. Recommend attention on follow-up. 4. Subtle findings which are  highly suspicious for mild cirrhosis. Upper abdominal adenopathy is similar and likely reactive. 5. Persistent right lower and improved left upper lobe ground-glass opacities are favored to be infectious or inflammatory.      06/01/2017 Imaging    CT chest  IMPRESSION: 1. Evolving radiation changes in the medial right hemithorax. 2. Stable moderate size right pleural effusion without definite nodular components. 3. No signs of local recurrence or progressive metastatic disease. No residual enlarged thoracic lymph nodes. 4. Stable appearance of the visualized upper abdomen with probable cirrhosis and reactive adenopathy in the porta hepatis.      07/15/2017 Procedure    Therapeutic thoracentesis performed today; 1.3 L removed.  Fluid sent for cytology.       07/15/2017 Pathology Results    Pleural fluid NEGATIVE for malignancy by cytology.       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 Paramediastinal radiation change and loculated right  pleural effusion appear unchanged from 07/14/2017. 2. Borderline prevascular lymph node within the anterior mediastinum is mildly increased in size from previous exam. 3. There is a new 5 mm lung nodule within the left lower lobe. Nonspecific in appearance. Attention on follow-up imaging advise. Other small nonspecific nodules in the left lung are stable. 4. Morphologic features of the liver compatible with cirrhosis. Enlarged upper abdominal lymph nodes are nonspecific and likely reactive. 5. Stable indeterminate low-attenuation focus in the posterior right liver dome. Previously characterized on MRI from 07/29/2017 as reflecting post treatment changes from external beam radiation to the lung. 6.  Emphysema (ICD10-J43.9).       Problem List Patient Active Problem List   Diagnosis Date Noted  . HCAP (healthcare-associated pneumonia) [J18.9]  01/28/2018  . Taking multiple medications for chronic disease [R69] 03/08/2017  . 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 Loss of weight [R63.4] 12/10/2016  . Encounter for screening colonoscopy [Z12.11] 12/10/2016  . Esophageal dysphagia [R13.10] 12/10/2016  . Abdominal pain, epigastric [R10.13] 12/10/2016  . Adenocarcinoma of right lung ([place]) [C34.91] 07/23/2016  . GERD [K21.9] 01/13/2009  . ACTINIC KERATOSIS [L57.0] 01/13/2009  . Anxiety state [F41.1] 12/27/2008  . DEPRESSION [F32.9] 12/27/2008  . ASTHMA [J45.909] 12/27/2008  . OSTEOARTHRITIS [M19.90] 12/27/2008  . LOW BACK PAIN [M54.5] 12/27/2008    Past Medical History Past Medical History:  Diagnosis Date  . Arnold-Chiari syndrome ([place])   . Arthritis   . Asthma   . Chronic back pain   . COPD (chronic obstructive pulmonary disease) ([place])   . Depression   . Dyspnea    with exertion   . GERD (gastroesophageal reflux disease)   . Hypertension   . Pneumonia   . Pulmonary embolus ([place])   . Seasonal allergies   . Spinal stenosis of lumbar region   . 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 Wheezing     Past Surgical History Past Surgical History:  Procedure Laterality Date  . BACK SURGERY     5 total  . BIOPSY  01/03/2017   Procedure: BIOPSY;  Surgeon: Daneil Dolin, MD;  [facility];  Service: Endoscopy;;  gastric  . COLONOSCOPY WITH PROPOFOL N/A 04/04/2017   Procedure: COLONOSCOPY WITH PROPOFOL;  Surgeon: Daneil Dolin, MD;  [facility];  Service: Endoscopy;  Laterality: N/A;  8:45am  . ESOPHAGOGASTRODUODENOSCOPY (EGD) WITH PROPOFOL N/A 01/03/2017   Procedure: ESOPHAGOGASTRODUODENOSCOPY (EGD) WITH PROPOFOL;  Surgeon: Daneil Dolin, MD;  [facility];  Service: Endoscopy;  Laterality: N/A;  . Venia Minks DILATION N/A 01/03/2017   Procedure: Venia Minks DILATION;  Surgeon: Daneil Dolin, MD;  [facility];  Service: Endoscopy;  Laterality: N/A;  .  MULTIPLE EXTRACTIONS WITH ALVEOLOPLASTY N/A 07/08/2014   Procedure: MULTIPLE EXTRACION WITH ALVEOLOPLASTY with EXCISION LESION RIGHT SIDE OF TONGUE;  Surgeon: Gae Bon, DDS;  Location: [facility];  Service: Oral Surgery;  Laterality: N/A;  . PORTACATH PLACEMENT Right 07/30/2016   Procedure: INSERTION PORT-A-CATH;  Surgeon: Vickie Epley, MD;  Location: [facility];  Service: Vascular;  Laterality: Right;  [name] VIDEO BRONCHOSCOPY WITH ENDOBRONCHIAL ULTRASOUND N/A 07/19/2016   Procedure: VIDEO BRONCHOSCOPY WITH ENDOBRONCHIAL ULTRASOUND;  Surgeon: [name], MD;  Location: [facility] OR;  Service: Thoracic;  Laterality: N/A;    Family History Family History  Problem Relation Age of Onset  . Cancer Mother        breast cancer  . Heart failure Brother   .  Colon cancer Neg Hx        not sure, ?grandfather and/or uncle     Social History  reports that he has been smoking cigarettes.  He has a 9.50 pack-year smoking history. He has quit using smokeless tobacco. His smokeless tobacco use included chew. He reports that he drinks about 1.2 oz of alcohol per week. He reports that he uses drugs. Drug: Marijuana. Frequency: 7.00 times per week.  Medications  Current Outpatient Medications:  .  albuterol (PROVENTIL HFA;VENTOLIN HFA) 108 (90 Base) MCG/ACT inhaler, Inhale 2 puffs into the lungs every 6 (six) hours as needed for wheezing., Disp: 1 Inhaler, Rfl: 0 .  ALPRAZolam (XANAX) 1 MG tablet, Take 1 mg by mouth 2 (two) times daily as needed for anxiety or sleep (takes 1 tablet and bedtime for sleep and 1 dose during the day only if needed). , Disp: , Rfl:  .  amLODipine (NORVASC) 5 MG tablet, Take 5 mg by mouth daily. , Disp: , Rfl:  .  apixaban (ELIQUIS) 5 MG TABS tablet, Take 1 tablet (5 mg total) by mouth 2 (two) times daily., Disp: 60 tablet, Rfl: 2 .  Artificial Tear Solution (SOOTHE XP) SOLN, Apply 2 drops to eye daily., Disp: , Rfl:  .  Atezolizumab (TECENTRIQ IV), Inject into the vein. Every 3  weeks, Disp: , Rfl:  .  escitalopram (LEXAPRO) 20 MG tablet, Take 20 mg by mouth daily. , Disp: , Rfl:  .  gabapentin (NEURONTIN) 600 MG tablet, Take 600 mg by mouth 2 (two) times daily. , Disp: , Rfl:  .  guaiFENesin (MUCINEX) 600 MG 12 hr tablet, Take 600 mg by mouth 2 (two) times daily as needed (for congestion.)., Disp: , Rfl:  .  hydrochlorothiazide (MICROZIDE) 12.5 MG capsule, Take 12.5 mg by mouth daily. , Disp: , Rfl:  .  lactulose (CHRONULAC) 10 GM/15ML solution, Take 15 mLs (10 g total) by mouth 3 (three) times daily as needed for mild constipation or moderate constipation., Disp: 240 mL, Rfl: 0 .  lidocaine (XYLOCAINE) 2 % solution, Use as directed 20 mLs in the mouth or throat every 6 (six) hours as needed (for mouth pain (used prior to eating when needed))., Disp: , Rfl:  .  lidocaine-prilocaine (EMLA) cream, Apply a quarter size amount to port site 1 hour prior to chemo. Do not rub in. Cover with plastic wrap., Disp: 30 g, Rfl: 2 .  methadone (DOLOPHINE) 10 MG tablet, Take 20 mg by mouth 5 (five) times daily. Takes 2 tablets 5 times daily, Disp: , Rfl:  .  mirtazapine (REMERON) 15 MG tablet, Take 1 tablet by mouth at bedtime., Disp: , Rfl:  .  Multiple Vitamin (MULTIVITAMIN WITH MINERALS) TABS tablet, Take 1 tablet by mouth daily. Centrum, Disp: , Rfl:  .  ondansetron (ZOFRAN) 8 MG tablet, TAKE 1 TABLET BY MOUTH EVERY 8 HOURS AS NEEDED FOR NAUSEA AND VOMITING., Disp: 30 tablet, Rfl: 2 .  pantoprazole (PROTONIX) 40 MG tablet, TAKE (1) TABLET BY MOUTH TWICE DAILY., Disp: 60 tablet, Rfl: 0 .  prochlorperazine (COMPAZINE) 10 MG tablet, TAKE 1 TABLET EVERY 6 HOURS AS NEEDED FOR NAUSEA AND VOMITING., Disp: 60 tablet, Rfl: 3 .  tiZANidine (ZANAFLEX) 4 MG tablet, Take 4 mg by mouth every 8 (eight) hours as needed for muscle spasms. , Disp: , Rfl:  .  TRELEGY ELLIPTA 100-62.5-25 MCG/INH AEPB, , Disp: , Rfl:  .  varenicline (CHANTIX) 0.5 MG tablet, TAKE (2) TABLETS BY MOUTH TWICE DAILY.,  Disp:  112 tablet, Rfl: 0 No current facility-administered medications for this visit.   Facility-Administered Medications Ordered in Other Visits:  .  0.9 %  sodium chloride infusion, , Intravenous, Continuous, K[place]as, Manon Hilding, PA-C, Last Rate: 10 mL/hr at 01/13/17 1330  Allergies Demeclocycline and Tetracyclines & related  Review of Systems Review of Systems - Oncology ROS as per HPI otherwise 12 point ROS is other than cough and congestion, fatigue, neuropathy.     Physical Exam  Vitals Wt Readings from Last 3 Encounters:  02/09/18 209 lb 8 oz (95 kg)  01/29/18 210 lb 12.2 oz (95.6 kg)  01/23/18 224 lb (101.6 kg)   Temp Readings from Last 3 Encounters:  02/09/18 98.4 F (36.9 C) (Oral)  02/01/18 98.3 F (36.8 C) (Oral)  01/23/18 98.7 F (37.1 C) (Oral)   BP Readings from Last 3 Encounters:  02/09/18 (!) 116/94  02/01/18 (!) 121/96  01/23/18 95/66   Pulse Readings from Last 3 Encounters:  02/09/18 (!) 106  02/01/18 (!) 106  01/23/18 98   Constitutional: Well-developed, well-nourished, and in no distress.   HENT: Head: Normocephalic and atraumatic.  Mouth/Throat: No oropharyngeal exudate. Mucosa moist. Eyes: Pupils are equal, round, and reactive to light. Conjunctivae are normal. No scleral icterus.  Neck: Normal range of motion. Neck supple. No JVD present.  Cardiovascular: Normal rate, regular rhythm and normal heart sounds.  Exam reveals no gallop and no friction rub.   No murmur heard. Pulmonary/Chest: Coarse Breath sounds with congestion.   Abdominal: Soft. Bowel sounds are normal. No distension. There is no tenderness. There is no guarding.  Musculoskeletal: No edema or tenderness.  Lymphadenopathy: No cervical, axillary or supraclavicular adenopathy.  Neurological: Alert and oriented to person, place, and time. No cranial nerve deficit.  Skin: Skin is warm and dry. No rash noted. No erythema. No pallor.  Psychiatric: Affect and judgment normal.   Labs Lab on  02/09/2018  Component Date Value Ref Range Status  . WBC 02/09/2018 7.2  4.0 - 10.5 K/uL Final  . RBC 02/09/2018 4.53  4.22 - 5.81 MIL/uL Final  . Hemoglobin 02/09/2018 13.9  13.0 - 17.0 g/dL Final  . HCT 02/09/2018 43.3  39.0 - 52.0 % Final  . MCV 02/09/2018 95.6  78.0 - 100.0 fL Final  . MCH 02/09/2018 30.7  26.0 - 34.0 pg Final  . MCHC 02/09/2018 32.1  30.0 - 36.0 g/dL Final  . RDW 02/09/2018 15.3  11.5 - 15.5 % Final  . Platelets 02/09/2018 203  150 - 400 K/uL Final  . Neutrophils Relative % 02/09/2018 79  % Final  . Neutro Abs 02/09/2018 5.7  1.7 - 7.7 K/uL Final  . Lymphocytes Relative 02/09/2018 17  % Final  . Lymphs Abs 02/09/2018 1.2  0.7 - 4.0 K/uL Final  . Monocytes Relative 02/09/2018 4  % Final  . Monocytes Absolute 02/09/2018 0.3  0.1 - 1.0 K/uL Final  . Eosinophils Relative 02/09/2018 0  % Final  . Eosinophils Absolute 02/09/2018 0.0  0.0 - 0.7 K/uL Final  . Basophils Relative 02/09/2018 0  % Final  . Basophils Absolute 02/09/2018 0.0  0.0 - 0.1 K/uL Final   Performed at [facility], [address], [place], [place] [address]  . Sodium 02/09/2018 136  135 - 145 mmol/L Final  . Potassium 02/09/2018 3.6  3.5 - 5.1 mmol/L Final  . Chloride 02/09/2018 96* 101 - 111 mmol/L Final  . CO2 02/09/2018 30  22 - 32 mmol/L Final  .  Glucose, Bld 02/09/2018 134* 65 - 99 mg/dL Final  . BUN 02/09/2018 9  6 - 20 mg/dL Final  . Creatinine, Ser 02/09/2018 0.69  0.61 - 1.24 mg/dL Final  . Calcium 02/09/2018 8.6* 8.9 - 10.3 mg/dL Final  . Total Protein 02/09/2018 8.2* 6.5 - 8.1 g/dL Final  . Albumin 02/09/2018 2.5* 3.5 - 5.0 g/dL Final  . AST 02/09/2018 56* 15 - 41 U/L Final  . ALT 02/09/2018 39  17 - 63 U/L Final  . Alkaline Phosphatase 02/09/2018 206* 38 - 126 U/L Final  . Total Bilirubin 02/09/2018 0.8  0.3 - 1.2 mg/dL Final  . GFR calc non Af Amer 02/09/2018 >60  >60 mL/min Final  . GFR calc Af Amer 02/09/2018 >60  >60 mL/min Final   Comment: (NOTE) The eGFR has been calculated  using the CKD EPI equation. This calculation has not been validated in all clinical situations. eGFR's persistently <60 mL/min signify possible Chronic Kidney Disease.   Georgiann Hahn gap 02/09/2018 10  5 - 15 Final   Performed at [facility], [address], [place], [place] [address]  . LDH 02/09/2018 141  98 - 192 U/L Final   Performed at [facility], [address], [place], [place] [address]     Pathology Orders Placed This Encounter  Procedures  . CBC with Differential/Platelet    Standing Status:   Future    Standing Expiration Date:   02/10/2019  . Comprehensive metabolic panel    Standing Status:   Future    Standing Expiration Date:   02/10/2019  . Lactate dehydrogenase    Standing Status:   Future    Standing Expiration Date:   02/10/2019       Zoila Shutter MD

## 2018-02-14 ENCOUNTER — Other Ambulatory Visit (HOSPITAL_COMMUNITY): Payer: Self-pay | Admitting: Adult Health

## 2018-02-14 ENCOUNTER — Telehealth (HOSPITAL_COMMUNITY): Payer: Self-pay

## 2018-02-14 NOTE — Telephone Encounter (Signed)
-----   Message from Louis Meckel sent at 02/14/2018  9:31 AM EDT ----- Regarding: issue Patient having issues with feet swelling.  Please call patient about issue.  Thanks

## 2018-02-14 NOTE — Telephone Encounter (Signed)
Patient called stating his feet were swelling more than usual. Both feet are swollen the same. They are not warm, red or painful. Patient just wanted to be sure he didn't need to do anything else. Reviewed with provider. Instructed patient to keep his feet elevated and if the swelling continued to worsen than call the cancer center back or go to ER. Patient verbalized understanding.

## 2018-02-16 ENCOUNTER — Inpatient Hospital Stay (HOSPITAL_COMMUNITY): Payer: Medicare Other

## 2018-02-16 VITALS — BP 91/67 | HR 80 | Temp 98.6°F | Resp 18 | Wt 219.2 lb

## 2018-02-16 DIAGNOSIS — C3491 Malignant neoplasm of unspecified part of right bronchus or lung: Secondary | ICD-10-CM

## 2018-02-16 DIAGNOSIS — C3431 Malignant neoplasm of lower lobe, right bronchus or lung: Secondary | ICD-10-CM | POA: Diagnosis not present

## 2018-02-16 LAB — CBC WITH DIFFERENTIAL/PLATELET
Basophils Absolute: 0 10*3/uL (ref 0.0–0.1)
Basophils Relative: 0 %
Eosinophils Absolute: 0.2 10*3/uL (ref 0.0–0.7)
Eosinophils Relative: 2 %
HCT: 41.8 % (ref 39.0–52.0)
HEMOGLOBIN: 13.2 g/dL (ref 13.0–17.0)
LYMPHS ABS: 1.4 10*3/uL (ref 0.7–4.0)
LYMPHS PCT: 17 %
MCH: 30.1 pg (ref 26.0–34.0)
MCHC: 31.6 g/dL (ref 30.0–36.0)
MCV: 95.2 fL (ref 78.0–100.0)
Monocytes Absolute: 0.8 10*3/uL (ref 0.1–1.0)
Monocytes Relative: 10 %
NEUTROS PCT: 71 %
Neutro Abs: 5.5 10*3/uL (ref 1.7–7.7)
Platelets: 209 10*3/uL (ref 150–400)
RBC: 4.39 MIL/uL (ref 4.22–5.81)
RDW: 15.6 % — ABNORMAL HIGH (ref 11.5–15.5)
WBC: 7.8 10*3/uL (ref 4.0–10.5)

## 2018-02-16 LAB — COMPREHENSIVE METABOLIC PANEL
ALK PHOS: 170 U/L — AB (ref 38–126)
ALT: 28 U/L (ref 17–63)
AST: 37 U/L (ref 15–41)
Albumin: 2.4 g/dL — ABNORMAL LOW (ref 3.5–5.0)
Anion gap: 9 (ref 5–15)
BUN: 8 mg/dL (ref 6–20)
CHLORIDE: 99 mmol/L — AB (ref 101–111)
CO2: 29 mmol/L (ref 22–32)
CREATININE: 0.68 mg/dL (ref 0.61–1.24)
Calcium: 8.6 mg/dL — ABNORMAL LOW (ref 8.9–10.3)
GFR calc non Af Amer: >60 mL/min (ref >60)
Glucose, Bld: 136 mg/dL — ABNORMAL HIGH (ref 65–99)
Potassium: 3.5 mmol/L (ref 3.5–5.1)
SODIUM: 137 mmol/L (ref 135–145)
Total Bilirubin: 0.7 mg/dL (ref 0.3–1.2)
Total Protein: 6.9 g/dL (ref 6.5–8.1)

## 2018-02-16 LAB — LACTATE DEHYDROGENASE: LDH: 160 U/L (ref 98–192)

## 2018-02-16 MED ORDER — FUROSEMIDE 10 MG/ML IJ SOLN
INTRAMUSCULAR | Status: AC
  Filled 2018-02-16: qty 4

## 2018-02-16 MED ORDER — FUROSEMIDE 10 MG/ML IJ SOLN
40.0000 mg | Freq: Once | INTRAMUSCULAR | Status: AC
Start: 2018-02-16 — End: 2018-02-16
  Administered 2018-02-16: 40 mg via INTRAVENOUS

## 2018-02-16 MED ORDER — HEPARIN SOD (PORK) LOCK FLUSH 100 UNIT/ML IV SOLN
500.0000 [IU] | Freq: Once | INTRAVENOUS | Status: AC | PRN
  Administered 2018-02-16: 500 [IU]

## 2018-02-16 MED ORDER — SODIUM CHLORIDE 0.9 % IV SOLN
1200.0000 mg | Freq: Once | INTRAVENOUS | Status: AC
  Administered 2018-02-16: 1200 mg via INTRAVENOUS
  Filled 2018-02-16: qty 20

## 2018-02-16 MED ORDER — SODIUM CHLORIDE 0.9 % IV SOLN
Freq: Once | INTRAVENOUS | Status: AC
  Administered 2018-02-16: 12:00:00 via INTRAVENOUS

## 2018-02-16 NOTE — Progress Notes (Signed)
Pt reports he is feeling better since his visit last week - states he still has a cough, but is much improved.  Denies shortness of breath at rest.  Pt does c/o bilateral lower extremity swelling up to calves onset 3 days ago.  Reports the swelling is not resolved with elevation of extremities.  +1 pitting edema noted to bilateral lower extremities.  No complaints otherwise.  Dr. Walden Field made aware of pt complaints of swelling and labs reviewed with MD.  Faythe Ghee to tx today per MD; orders rec'd for Lasix 40 mg IV x 1 dose.   Tolerated infusion w/o adverse reaction.  Alert, in no distress.  VSS.  Discharged ambulatory.

## 2018-03-02 ENCOUNTER — Other Ambulatory Visit (HOSPITAL_COMMUNITY): Payer: Self-pay | Admitting: Adult Health

## 2018-03-02 DIAGNOSIS — C3491 Malignant neoplasm of unspecified part of right bronchus or lung: Secondary | ICD-10-CM

## 2018-03-03 ENCOUNTER — Telehealth (HOSPITAL_COMMUNITY): Payer: Self-pay

## 2018-03-03 NOTE — Telephone Encounter (Signed)
After reviewing with Dr. Walden Field, she recommends patient go to there ER. Notified patient who verbalized understanding.

## 2018-03-03 NOTE — Telephone Encounter (Signed)
Patient called and left message stating his legs were swelling worse than normal. He states both legs are sore, swollen up to his knees, and he states it feels like his legs are going to "pop". He rates the pain as a 2. States it is worse when he first gets up. Denies redness but states they feel warm to the touch. Explained to patient I would review with MD and call him back. He verbalized understanding.

## 2018-03-06 ENCOUNTER — Inpatient Hospital Stay (HOSPITAL_COMMUNITY)
Admission: EM | Admit: 2018-03-06 | Discharge: 2018-03-08 | DRG: 190 | Disposition: A | Discharge status: Home / Self Care | Payer: Medicare Other | Attending: Internal Medicine | Admitting: Internal Medicine

## 2018-03-06 ENCOUNTER — Other Ambulatory Visit: Payer: Self-pay

## 2018-03-06 ENCOUNTER — Emergency Department (HOSPITAL_COMMUNITY): Payer: Medicare Other

## 2018-03-06 ENCOUNTER — Encounter (HOSPITAL_COMMUNITY): Payer: Self-pay

## 2018-03-06 DIAGNOSIS — I10 Essential (primary) hypertension: Secondary | ICD-10-CM | POA: Diagnosis present

## 2018-03-06 DIAGNOSIS — Z7901 Long term (current) use of anticoagulants: Secondary | ICD-10-CM

## 2018-03-06 DIAGNOSIS — J9811 Atelectasis: Secondary | ICD-10-CM | POA: Diagnosis present

## 2018-03-06 DIAGNOSIS — J9621 Acute and chronic respiratory failure with hypoxia: Secondary | ICD-10-CM

## 2018-03-06 DIAGNOSIS — Q0700 Arnold-Chiari syndrome without spina bifida or hydrocephalus: Secondary | ICD-10-CM | POA: Diagnosis not present

## 2018-03-06 DIAGNOSIS — J441 Chronic obstructive pulmonary disease with (acute) exacerbation: Secondary | ICD-10-CM | POA: Diagnosis present

## 2018-03-06 DIAGNOSIS — Z86711 Personal history of pulmonary embolism: Secondary | ICD-10-CM | POA: Diagnosis not present

## 2018-03-06 DIAGNOSIS — G894 Chronic pain syndrome: Secondary | ICD-10-CM | POA: Diagnosis present

## 2018-03-06 DIAGNOSIS — T380X5A Adverse effect of glucocorticoids and synthetic analogues, initial encounter: Secondary | ICD-10-CM | POA: Diagnosis present

## 2018-03-06 DIAGNOSIS — R739 Hyperglycemia, unspecified: Secondary | ICD-10-CM | POA: Diagnosis present

## 2018-03-06 DIAGNOSIS — C3491 Malignant neoplasm of unspecified part of right bronchus or lung: Secondary | ICD-10-CM | POA: Diagnosis present

## 2018-03-06 DIAGNOSIS — Z881 Allergy status to other antibiotic agents status: Secondary | ICD-10-CM | POA: Diagnosis not present

## 2018-03-06 DIAGNOSIS — F419 Anxiety disorder, unspecified: Secondary | ICD-10-CM | POA: Diagnosis present

## 2018-03-06 DIAGNOSIS — K210 Gastro-esophageal reflux disease with esophagitis: Secondary | ICD-10-CM | POA: Diagnosis not present

## 2018-03-06 DIAGNOSIS — J449 Chronic obstructive pulmonary disease, unspecified: Secondary | ICD-10-CM | POA: Diagnosis present

## 2018-03-06 DIAGNOSIS — K219 Gastro-esophageal reflux disease without esophagitis: Secondary | ICD-10-CM | POA: Diagnosis present

## 2018-03-06 DIAGNOSIS — Z79899 Other long term (current) drug therapy: Secondary | ICD-10-CM | POA: Diagnosis not present

## 2018-03-06 DIAGNOSIS — Z9221 Personal history of antineoplastic chemotherapy: Secondary | ICD-10-CM

## 2018-03-06 DIAGNOSIS — F17210 Nicotine dependence, cigarettes, uncomplicated: Secondary | ICD-10-CM | POA: Diagnosis present

## 2018-03-06 DIAGNOSIS — M1990 Unspecified osteoarthritis, unspecified site: Secondary | ICD-10-CM | POA: Diagnosis present

## 2018-03-06 DIAGNOSIS — J471 Bronchiectasis with (acute) exacerbation: Secondary | ICD-10-CM | POA: Diagnosis not present

## 2018-03-06 DIAGNOSIS — F329 Major depressive disorder, single episode, unspecified: Secondary | ICD-10-CM | POA: Diagnosis present

## 2018-03-06 DIAGNOSIS — J9601 Acute respiratory failure with hypoxia: Secondary | ICD-10-CM | POA: Diagnosis not present

## 2018-03-06 LAB — DIFFERENTIAL
BASOS PCT: 0 %
Basophils Absolute: 0 10*3/uL (ref 0.0–0.1)
EOS ABS: 0 10*3/uL (ref 0.0–0.7)
Eosinophils Relative: 0 %
Lymphocytes Relative: 15 %
Lymphs Abs: 1.6 10*3/uL (ref 0.7–4.0)
MONO ABS: 1 10*3/uL (ref 0.1–1.0)
MONOS PCT: 10 %
Neutro Abs: 7.6 10*3/uL (ref 1.7–7.7)
Neutrophils Relative %: 75 %

## 2018-03-06 LAB — CBC
HEMATOCRIT: 40 % (ref 39.0–52.0)
Hemoglobin: 12.8 g/dL — ABNORMAL LOW (ref 13.0–17.0)
MCH: 29.9 pg (ref 26.0–34.0)
MCHC: 32 g/dL (ref 30.0–36.0)
MCV: 93.5 fL (ref 78.0–100.0)
PLATELETS: 280 10*3/uL (ref 150–400)
RBC: 4.28 MIL/uL (ref 4.22–5.81)
RDW: 15.5 % (ref 11.5–15.5)
WBC: 10.4 10*3/uL (ref 4.0–10.5)

## 2018-03-06 LAB — BASIC METABOLIC PANEL
Anion gap: 12 (ref 5–15)
BUN: 6 mg/dL (ref 6–20)
CALCIUM: 8.6 mg/dL — AB (ref 8.9–10.3)
CO2: 31 mmol/L (ref 22–32)
CREATININE: 0.69 mg/dL (ref 0.61–1.24)
Chloride: 92 mmol/L — ABNORMAL LOW (ref 101–111)
GFR calc Af Amer: >60 mL/min (ref >60)
GLUCOSE: 116 mg/dL — AB (ref 65–99)
Potassium: 3.7 mmol/L (ref 3.5–5.1)
Sodium: 135 mmol/L (ref 135–145)

## 2018-03-06 LAB — HEPATIC FUNCTION PANEL
ALT: 26 U/L (ref 17–63)
AST: 40 U/L (ref 15–41)
Albumin: 2.1 g/dL — ABNORMAL LOW (ref 3.5–5.0)
Alkaline Phosphatase: 213 U/L — ABNORMAL HIGH (ref 38–126)
BILIRUBIN INDIRECT: 0.4 mg/dL (ref 0.3–0.9)
Bilirubin, Direct: 0.4 mg/dL (ref 0.1–0.5)
TOTAL PROTEIN: 6.7 g/dL (ref 6.5–8.1)
Total Bilirubin: 0.8 mg/dL (ref 0.3–1.2)

## 2018-03-06 LAB — BRAIN NATRIURETIC PEPTIDE: B NATRIURETIC PEPTIDE 5: 143 pg/mL — AB (ref 0.0–100.0)

## 2018-03-06 LAB — TROPONIN I

## 2018-03-06 MED ORDER — METHYLPREDNISOLONE SODIUM SUCC 125 MG IJ SOLR
125.0000 mg | Freq: Once | INTRAMUSCULAR | Status: AC
  Administered 2018-03-06: 125 mg via INTRAVENOUS
  Filled 2018-03-06: qty 2

## 2018-03-06 MED ORDER — ACETAMINOPHEN 325 MG PO TABS: 650.0000 mg | ORAL_TABLET | Freq: Four times a day (QID) | ORAL | Status: DC | PRN

## 2018-03-06 MED ORDER — SODIUM CHLORIDE 0.9% FLUSH
3.0000 mL | Freq: Two times a day (BID) | INTRAVENOUS | Status: DC
  Administered 2018-03-06 – 2018-03-08 (×4): 3 mL via INTRAVENOUS

## 2018-03-06 MED ORDER — IPRATROPIUM-ALBUTEROL 0.5-2.5 (3) MG/3ML IN SOLN
3.0000 mL | Freq: Once | RESPIRATORY_TRACT | Status: AC
  Administered 2018-03-06: 3 mL via RESPIRATORY_TRACT
  Filled 2018-03-06: qty 3

## 2018-03-06 MED ORDER — APIXABAN 5 MG PO TABS
5.0000 mg | ORAL_TABLET | Freq: Two times a day (BID) | ORAL | Status: DC
  Administered 2018-03-06 – 2018-03-08 (×4): 5 mg via ORAL
  Filled 2018-03-06 (×6): qty 1

## 2018-03-06 MED ORDER — ORAL CARE MOUTH RINSE: 15.0000 mL | Freq: Two times a day (BID) | OROMUCOSAL | Status: DC

## 2018-03-06 MED ORDER — METHADONE HCL 10 MG PO TABS
20.0000 mg | ORAL_TABLET | Freq: Every day | ORAL | Status: DC
  Administered 2018-03-06 – 2018-03-08 (×8): 20 mg via ORAL
  Filled 2018-03-06 (×8): qty 2

## 2018-03-06 MED ORDER — MIRTAZAPINE 15 MG PO TABS
15.0000 mg | ORAL_TABLET | Freq: Every day | ORAL | Status: DC
  Administered 2018-03-06 – 2018-03-07 (×2): 15 mg via ORAL
  Filled 2018-03-06 (×2): qty 1

## 2018-03-06 MED ORDER — HYDROCHLOROTHIAZIDE 12.5 MG PO CAPS
12.5000 mg | ORAL_CAPSULE | Freq: Every day | ORAL | Status: DC
  Administered 2018-03-07 – 2018-03-08 (×2): 12.5 mg via ORAL
  Filled 2018-03-06 (×2): qty 1

## 2018-03-06 MED ORDER — ALBUTEROL SULFATE (2.5 MG/3ML) 0.083% IN NEBU
2.5000 mg | INHALATION_SOLUTION | Freq: Four times a day (QID) | RESPIRATORY_TRACT | Status: DC | PRN
Start: 2018-03-06 — End: 2018-03-08
  Administered 2018-03-07: 2.5 mg via RESPIRATORY_TRACT
  Filled 2018-03-06 (×2): qty 3

## 2018-03-06 MED ORDER — METHYLPREDNISOLONE SODIUM SUCC 125 MG IJ SOLR
80.0000 mg | Freq: Three times a day (TID) | INTRAMUSCULAR | Status: DC
  Administered 2018-03-06 – 2018-03-08 (×5): 80 mg via INTRAVENOUS
  Filled 2018-03-06 (×5): qty 2

## 2018-03-06 MED ORDER — ALBUTEROL SULFATE (2.5 MG/3ML) 0.083% IN NEBU
2.5000 mg | INHALATION_SOLUTION | Freq: Once | RESPIRATORY_TRACT | Status: AC
  Administered 2018-03-06: 2.5 mg via RESPIRATORY_TRACT
  Filled 2018-03-06: qty 3

## 2018-03-06 MED ORDER — SODIUM CHLORIDE 0.9% FLUSH: 3.0000 mL | INTRAVENOUS | Status: DC | PRN

## 2018-03-06 MED ORDER — CHLORHEXIDINE GLUCONATE 0.12 % MT SOLN: 15.0000 mL | Freq: Two times a day (BID) | OROMUCOSAL | Status: DC

## 2018-03-06 MED ORDER — LACTULOSE 10 GM/15ML PO SOLN: 10.0000 g | Freq: Three times a day (TID) | ORAL | Status: DC | PRN

## 2018-03-06 MED ORDER — POLYVINYL ALCOHOL 1.4 % OP SOLN
2.0000 [drp] | Freq: Every day | OPHTHALMIC | Status: DC
  Administered 2018-03-06 – 2018-03-08 (×3): 2 [drp] via OPHTHALMIC
  Filled 2018-03-06 (×2): qty 15

## 2018-03-06 MED ORDER — CHLORHEXIDINE GLUCONATE 0.12 % MT SOLN
15.0000 mL | Freq: Two times a day (BID) | OROMUCOSAL | Status: DC
  Administered 2018-03-06 – 2018-03-08 (×4): 15 mL via OROMUCOSAL
  Filled 2018-03-06 (×3): qty 15

## 2018-03-06 MED ORDER — ALPRAZOLAM 1 MG PO TABS
1.0000 mg | ORAL_TABLET | Freq: Two times a day (BID) | ORAL | Status: DC | PRN
  Administered 2018-03-06 – 2018-03-07 (×2): 1 mg via ORAL
  Filled 2018-03-06 (×2): qty 1

## 2018-03-06 MED ORDER — PANTOPRAZOLE SODIUM 40 MG PO TBEC
40.0000 mg | DELAYED_RELEASE_TABLET | Freq: Two times a day (BID) | ORAL | Status: DC
  Administered 2018-03-06 – 2018-03-08 (×4): 40 mg via ORAL
  Filled 2018-03-06 (×4): qty 1

## 2018-03-06 MED ORDER — GABAPENTIN 300 MG PO CAPS
600.0000 mg | ORAL_CAPSULE | Freq: Two times a day (BID) | ORAL | Status: DC
  Administered 2018-03-06 – 2018-03-08 (×4): 600 mg via ORAL
  Filled 2018-03-06 (×4): qty 2

## 2018-03-06 MED ORDER — ACETAMINOPHEN 650 MG RE SUPP
650.0000 mg | Freq: Four times a day (QID) | RECTAL | Status: DC | PRN
Start: 2018-03-06 — End: 2018-03-08

## 2018-03-06 MED ORDER — SODIUM CHLORIDE 0.9 % IV SOLN: 250.0000 mL | INTRAVENOUS | Status: DC | PRN

## 2018-03-06 MED ORDER — ESCITALOPRAM OXALATE 10 MG PO TABS
20.0000 mg | ORAL_TABLET | Freq: Every day | ORAL | Status: DC
  Administered 2018-03-07 – 2018-03-08 (×2): 20 mg via ORAL
  Filled 2018-03-06 (×2): qty 2

## 2018-03-06 MED ORDER — AMLODIPINE BESYLATE 5 MG PO TABS
5.0000 mg | ORAL_TABLET | Freq: Every day | ORAL | Status: DC
  Administered 2018-03-07 – 2018-03-08 (×2): 5 mg via ORAL
  Filled 2018-03-06 (×2): qty 1

## 2018-03-06 MED ORDER — MAGNESIUM SULFATE 2 GM/50ML IV SOLN
2.0000 g | Freq: Once | INTRAVENOUS | Status: AC
  Administered 2018-03-06: 2 g via INTRAVENOUS
  Filled 2018-03-06: qty 50

## 2018-03-06 MED ORDER — ALBUTEROL SULFATE (2.5 MG/3ML) 0.083% IN NEBU
2.5000 mg | INHALATION_SOLUTION | Freq: Four times a day (QID) | RESPIRATORY_TRACT | Status: DC
  Administered 2018-03-06: 2.5 mg via RESPIRATORY_TRACT

## 2018-03-06 MED ORDER — SODIUM CHLORIDE 0.9 % IV SOLN
500.0000 mg | INTRAVENOUS | Status: DC
  Administered 2018-03-06 – 2018-03-07 (×2): 500 mg via INTRAVENOUS
  Filled 2018-03-06 (×4): qty 500

## 2018-03-06 MED ORDER — FLUTICASONE-UMECLIDIN-VILANT 100-62.5-25 MCG/INH IN AEPB
1.0000 | INHALATION_SPRAY | Freq: Every day | RESPIRATORY_TRACT | Status: DC
  Filled 2018-03-06 (×2): qty 1

## 2018-03-06 MED ORDER — GUAIFENESIN ER 600 MG PO TB12: 600.0000 mg | ORAL_TABLET | Freq: Two times a day (BID) | ORAL | Status: DC | PRN

## 2018-03-06 MED ORDER — TIZANIDINE HCL 4 MG PO TABS: 4.0000 mg | ORAL_TABLET | Freq: Three times a day (TID) | ORAL | Status: DC | PRN

## 2018-03-06 MED ORDER — ADULT MULTIVITAMIN W/MINERALS CH
1.0000 | ORAL_TABLET | Freq: Every day | ORAL | Status: DC
  Administered 2018-03-07 – 2018-03-08 (×2): 1 via ORAL
  Filled 2018-03-06 (×2): qty 1

## 2018-03-06 MED ORDER — ENSURE ENLIVE PO LIQD
237.0000 mL | Freq: Two times a day (BID) | ORAL | Status: DC
  Administered 2018-03-07 (×2): 237 mL via ORAL

## 2018-03-06 MED ORDER — VARENICLINE TARTRATE 1 MG PO TABS
1.0000 mg | ORAL_TABLET | Freq: Two times a day (BID) | ORAL | Status: DC
  Administered 2018-03-07 – 2018-03-08 (×3): 1 mg via ORAL
  Filled 2018-03-06: qty 1
  Filled 2018-03-06 (×3): qty 2
  Filled 2018-03-06 (×3): qty 1
  Filled 2018-03-06: qty 2

## 2018-03-06 NOTE — ED Notes (Signed)
ED Provider at bedside. 

## 2018-03-06 NOTE — ED Triage Notes (Signed)
Pt is complaining of SOB. Was hospitalized for pneumonia and states it hasn't gotten better. Started 4 weeks ago. Pt has a productive cough that is yellow. SOB at rest and exertion.

## 2018-03-06 NOTE — ED Provider Notes (Signed)
[facility] EMERGENCY DEPARTMENT Provider Note   CSN: [id number] Arrival date & time: 03/06/18  1403     History   Chief Complaint Chief Complaint  Patient presents with  . Shortness of Breath    HPI Erik [name] is a 57 y.o. male.  Patient complains of shortness of breath and cough with yellow sputum production.  Patient has a history of COPD and lung cancer  The history is provided by the patient. No language interpreter was used.  Shortness of Breath  This is a chronic problem. The current episode started 2 days ago. Associated symptoms include wheezing. Pertinent negatives include no fever, no headaches, no cough, no chest pain, no abdominal pain and no rash. It is unknown what precipitated the problem. He has tried ipratropium inhalers for the symptoms. The treatment provided mild relief. He has had prior hospitalizations. He has had prior ED visits. He has had prior ICU admissions.    Past Medical History:  Diagnosis Date  . Arnold-Chiari syndrome ([place])   . Arthritis   . Asthma   . Chronic back pain   . COPD (chronic obstructive pulmonary disease) ([place])   . Depression   . Dyspnea    with exertion   . GERD (gastroesophageal reflux disease)   . Hypertension   . Pneumonia   . Pulmonary embolus ([place])   . Seasonal allergies   . Spinal stenosis of lumbar region   . 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 Wheezing     Patient Active Problem List   Diagnosis Date Noted  . HCAP (healthcare-associated pneumonia) 01/28/2018  . Taking multiple medications for chronic disease 03/08/2017  . Radiation-induced esophagitis 02/02/2017  . Loss of weight 12/10/2016  . Encounter for screening colonoscopy 12/10/2016  . Esophageal dysphagia 12/10/2016  . Abdominal pain, epigastric 12/10/2016  . Adenocarcinoma of right lung ([place]) 07/23/2016  . GERD 01/13/2009  . ACTINIC KERATOSIS 01/13/2009  . Anxiety state 12/27/2008  . DEPRESSION 12/27/2008  . ASTHMA  12/27/2008  . OSTEOARTHRITIS 12/27/2008  . LOW BACK PAIN 12/27/2008    Past Surgical History:  Procedure Laterality Date  . BACK SURGERY     5 total  . BIOPSY  01/03/2017   Procedure: BIOPSY;  Surgeon: Daneil Dolin, MD;  [facility];  Service: Endoscopy;;  gastric  . COLONOSCOPY WITH PROPOFOL N/A 04/04/2017   Procedure: COLONOSCOPY WITH PROPOFOL;  Surgeon: Daneil Dolin, MD;  [facility];  Service: Endoscopy;  Laterality: N/A;  8:45am  . ESOPHAGOGASTRODUODENOSCOPY (EGD) WITH PROPOFOL N/A 01/03/2017   Procedure: ESOPHAGOGASTRODUODENOSCOPY (EGD) WITH PROPOFOL;  Surgeon: Daneil Dolin, MD;  [facility];  Service: Endoscopy;  Laterality: N/A;  . Venia Minks DILATION N/A 01/03/2017   Procedure: Venia Minks DILATION;  Surgeon: Daneil Dolin, MD;  [facility];  Service: Endoscopy;  Laterality: N/A;  . MULTIPLE EXTRACTIONS WITH ALVEOLOPLASTY N/A 07/08/2014   Procedure: MULTIPLE EXTRACION WITH ALVEOLOPLASTY with EXCISION LESION RIGHT SIDE OF TONGUE;  Surgeon: Gae Bon, DDS;  Location: [place];  Service: Oral Surgery;  Laterality: N/A;  . PORTACATH PLACEMENT Right 07/30/2016   Procedure: INSERTION PORT-A-CATH;  Surgeon: Vickie Epley, MD;  Location: [facility];  Service: Vascular;  Laterality: Right;  [name] VIDEO BRONCHOSCOPY WITH ENDOBRONCHIAL ULTRASOUND N/A 07/19/2016   Procedure: VIDEO BRONCHOSCOPY WITH ENDOBRONCHIAL ULTRASOUND;  Surgeon: [name], MD;  Location: [place];  Service: Thoracic;  Laterality: N/A;  Home Medications    Prior to Admission medications   Medication Sig Start Date End Date Taking? Authorizing Provider  albuterol (PROVENTIL HFA;VENTOLIN HFA) 108 (90 Base) MCG/ACT inhaler Inhale 2 puffs into the lungs every 6 (six) hours as needed for wheezing. 12/26/17  Yes Burns, Wandra Feinstein, NP  ALPRAZolam Duanne Moron) 1 MG tablet Take 1 mg by mouth 2 (two) times daily as needed for anxiety or sleep (takes 1 tablet and bedtime for sleep  and 1 dose during the day only if needed).  06/26/16  Yes [provider]  amLODipine (NORVASC) 5 MG tablet Take 5 mg by mouth daily.  07/07/16  Yes [provider]  apixaban (ELIQUIS) 5 MG TABS tablet Take 1 tablet (5 mg total) by mouth 2 (two) times daily. 01/23/18  Yes Jacquelin Hawking, NP  Artificial Tear Solution (SOOTHE XP) SOLN Apply 2 drops to eye daily.   Yes [provider]  Atezolizumab (TECENTRIQ IV) Inject into the vein. Every 3 weeks   Yes [provider]  escitalopram (LEXAPRO) 20 MG tablet Take 20 mg by mouth daily.  07/07/16  Yes [provider]  gabapentin (NEURONTIN) 600 MG tablet Take 600 mg by mouth 2 (two) times daily.  08/19/17  Yes [provider]  guaiFENesin (MUCINEX) 600 MG 12 hr tablet Take 600 mg by mouth 2 (two) times daily as needed (for congestion.).   Yes [provider]  hydrochlorothiazide (MICROZIDE) 12.5 MG capsule Take 12.5 mg by mouth daily.  11/15/17  Yes [provider]  lactulose (CHRONULAC) 10 GM/15ML solution Take 15 mLs (10 g total) by mouth 3 (three) times daily as needed for mild constipation or moderate constipation. 01/23/18  Yes Jacquelin Hawking, NP  methadone (DOLOPHINE) 10 MG tablet Take 20 mg by mouth 5 (five) times daily. Takes 2 tablets 5 times daily 06/26/16  Yes [provider]  mirtazapine (REMERON) 15 MG tablet Take 1 tablet by mouth at bedtime. 08/17/17  Yes [provider]  Multiple Vitamin (MULTIVITAMIN WITH MINERALS) TABS tablet Take 1 tablet by mouth daily. Centrum   Yes [provider]  ondansetron (ZOFRAN) 8 MG tablet TAKE 1 TABLET BY MOUTH EVERY 8 HOURS AS NEEDED FOR NAUSEA AND VOMITING. 03/02/18  Yes Holley Bouche, NP  pantoprazole (PROTONIX) 40 MG tablet TAKE (1) TABLET BY MOUTH TWICE DAILY. 02/03/18  Yes Holley Bouche, NP  tiZANidine (ZANAFLEX) 4 MG tablet Take 4 mg by mouth every 8 (eight) hours as needed for muscle spasms.  06/03/16   Yes [provider]  Donnal Debar 100-62.5-25 MCG/INH AEPB  01/11/18  Yes [provider]  varenicline (CHANTIX) 0.5 MG tablet TAKE (2) TABLETS BY MOUTH TWICE DAILY. 02/15/18  Yes Holley Bouche, NP    Family History Family History  Problem Relation Age of Onset  . Cancer Mother        breast cancer  . Heart failure Brother   . Colon cancer Neg Hx        not sure, ?grandfather and/or uncle    Social History Social History   Tobacco Use  . Smoking status: Current Every Day Smoker    Packs/day: 0.25    Years: 38.00    Pack years: 9.50    Types: Cigarettes  . Smokeless tobacco: Former Systems developer    Types: Chew  . Tobacco comment: using chantix- smoking once in a while  Substance Use Topics  . Alcohol use: Yes    Alcohol/week: 1.2 oz  Types: 2 Cans of beer per week    Comment: reports drinking 0-2 beers a week  . Drug use: Yes    Frequency: 7.0 times per week    Types: Marijuana    Comment: daily for cancer; pt denies any use at present time 08/23/17     Allergies   Demeclocycline and Tetracyclines & related   Review of Systems Review of Systems  Constitutional: Negative for appetite change, fatigue and fever.  HENT: Negative for congestion, ear discharge and sinus pressure.   Eyes: Negative for discharge.  Respiratory: Positive for shortness of breath and wheezing. Negative for cough.   Cardiovascular: Negative for chest pain.  Gastrointestinal: Negative for abdominal pain and diarrhea.  Genitourinary: Negative for frequency and hematuria.  Musculoskeletal: Negative for back pain.  Skin: Negative for rash.  Neurological: Negative for seizures and headaches.  Psychiatric/Behavioral: Negative for hallucinations.     Physical Exam Updated Vital Signs BP 112/65   Pulse (!) 104   Temp 97.7 F (36.5 C) (Oral)   Resp 11   Ht 6' (1.829 m)   Wt 101.2 kg (223 lb)   SpO2 97%   BMI 30.24 kg/m   Physical Exam  Constitutional: He is oriented to  person, place, and time. He appears well-developed.  HENT:  Head: Normocephalic.  Eyes: Conjunctivae and EOM are normal. No scleral icterus.  Neck: Neck supple. No thyromegaly present.  Cardiovascular: Normal rate and regular rhythm. Exam reveals no gallop and no friction rub.  No murmur heard. Pulmonary/Chest: No stridor. He has wheezes. He has no rales. He exhibits no tenderness.  Abdominal: He exhibits no distension. There is no tenderness. There is no rebound.  Musculoskeletal: Normal range of motion. He exhibits no edema.  Lymphadenopathy:    He has no cervical adenopathy.  Neurological: He is oriented to person, place, and time. He exhibits normal muscle tone. Coordination normal.  Skin: No rash noted. No erythema.  Psychiatric: He has a normal mood and affect. His behavior is normal.     ED Treatments / Results  Labs (all labs ordered are listed, but only abnormal results are displayed) Labs Reviewed  CBC - Abnormal; Notable for the following components:      Result Value   Hemoglobin 12.8 (*)    All other components within normal limits  BASIC METABOLIC PANEL - Abnormal; Notable for the following components:   Chloride 92 (*)    Glucose, Bld 116 (*)    Calcium 8.6 (*)    All other components within normal limits  HEPATIC FUNCTION PANEL - Abnormal; Notable for the following components:   Albumin 2.1 (*)    Alkaline Phosphatase 213 (*)    All other components within normal limits  BRAIN NATRIURETIC PEPTIDE - Abnormal; Notable for the following components:   B Natriuretic Peptide 143.0 (*)    All other components within normal limits  TROPONIN I  DIFFERENTIAL    EKG EKG Interpretation  Date/Time:  Monday March 06 2018 14:17:45 EDT Ventricular Rate:  103 PR Interval:    QRS Duration: 103 QT Interval:  380 QTC Calculation: 498 R Axis:   58 Text Interpretation:  Sinus tachycardia Probable left atrial enlargement Borderline prolonged QT interval Baseline wander in  lead(s) V2 Confirmed by Milton Ferguson [phone]) on 03/06/2018 5:04:36 PM   Radiology Dg Chest 2 View  Result Date: 03/06/2018 CLINICAL DATA:  Shortness of breath, pneumonia EXAM: CHEST - 2 VIEW COMPARISON:  CT chest dated 01/28/2018 FINDINGS: Left lung  is clear. Volume loss in the right hemithorax. Moderate right pleural effusion. Right lower lobe opacity, likely atelectasis. Prior multifocal right upper lung opacities are significantly improved. Radiation changes in the right perihilar/paramediastinal region. The heart is normal in size. Right chest port terminates the cavoatrial junction. IMPRESSION: Prior multifocal right upper lung opacities are significantly improved. Radiation changes in the right perihilar/paramediastinal region. Right lower lobe opacity, likely atelectasis. Moderate right pleural effusion. Electronically Signed   By: Julian Hy M.D.   On: 03/06/2018 14:43    Procedures Procedures (including critical care time)  Medications Ordered in ED Medications  ipratropium-albuterol (DUONEB) 0.5-2.5 (3) MG/3ML nebulizer solution 3 mL (3 mLs Nebulization Given 03/06/18 1509)  albuterol (PROVENTIL) (2.5 MG/3ML) 0.083% nebulizer solution 2.5 mg (2.5 mg Nebulization Given 03/06/18 1509)  magnesium sulfate IVPB 2 g 50 mL (0 g Intravenous Stopped 03/06/18 1757)  methylPREDNISolone sodium succinate (SOLU-MEDROL) 125 mg/2 mL injection 125 mg (125 mg Intravenous Given 03/06/18 1448)  ipratropium-albuterol (DUONEB) 0.5-2.5 (3) MG/3ML nebulizer solution 3 mL (3 mLs Nebulization Given 03/06/18 1621)  albuterol (PROVENTIL) (2.5 MG/3ML) 0.083% nebulizer solution 2.5 mg (2.5 mg Nebulization Given 03/06/18 1621)     Initial Impression / Assessment and Plan / ED Course  I have reviewed the triage vital signs and the nursing notes.  Pertinent labs & imaging results that were available during my care of the patient were reviewed by me and considered in my medical decision making (see chart for  details).     Patient with exacerbation of COPD.  He ambulated through the ER without oxygen and his sats dropped to 88%.  He will be admitted to medicine for COPD treatment  Final Clinical Impressions(s) / ED Diagnoses   Final diagnoses:  COPD exacerbation [facility])    ED Discharge Orders    None       Milton Ferguson, MD 03/06/18 1801

## 2018-03-06 NOTE — ED Notes (Signed)
Dr. Maudie Mercury in room

## 2018-03-06 NOTE — ED Notes (Signed)
Pt ambulated well, had SOB walking into the room returning from walking. O2 was 98% at beginning of walk and 88% at the end.

## 2018-03-06 NOTE — H&P (Signed)
TRH H&P   Patient Demographics:    Erik [name], is a 57 y.o. male  MRN: [id number]   DOB - 04/18/1961  Admit Date - 03/06/2018  Outpatient Primary MD for the patient is [name], MD  Referring MD/NP/PA: Denton Meek  Outpatient Specialists:   Patient coming from: home  Chief Complaint  Patient presents with  . Shortness of Breath      HPI:    Erik [name]  is a 57 y.o. 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  Denies fever, chills, cp, palp, n/v, diarrhea, brbpr, black stool.    In ED,   CXR IMPRESSION: Prior multifocal right upper lung opacities are significantly improved.  Radiation changes in the right perihilar/paramediastinal region.  Right lower lobe opacity, likely atelectasis. Moderate right pleural effusion.  Trop <0.03 Wbc 10.4, Hgb 12.8, Plt 280 Na 135, K 3.7, Bun 6, Creatinine 0.69 Glucose 116 Ast 40, Alt 26, alk phos 213, T. Bili 0.8 BNP 143  Pt will be admitted for Copd exacerbation    Review of systems:    In addition to the HPI above,  No Fever-chills, No Headache, No changes with Vision or hearing, No problems swallowing food or Liquids, No Chest pain,  No Abdominal pain, No Nausea or Vommitting, Bowel movements are regular, No Blood in stool or Urine, No dysuria, No new skin rashes or bruises, No new joints pains-aches,  No new weakness, tingling, numbness in any extremity, No recent weight gain or loss, No polyuria, polydypsia or polyphagia, No significant Mental Stressors.  A full 10 point Review of Systems was done, except as stated above, all other Review of Systems were negative.   With Past History of the following :    Past Medical History:  Diagnosis Date  . Arnold-Chiari syndrome ([place])   . Arthritis   . Asthma   .  Chronic back pain   . COPD (chronic obstructive pulmonary disease) ([place])   . Depression   . Dyspnea    with exertion   . GERD (gastroesophageal reflux disease)   . Hypertension   . Pneumonia   . Pulmonary embolus ([place])   . Seasonal allergies   . Spinal stenosis of lumbar region   . 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 Wheezing       Past Surgical History:  Procedure Laterality Date  . BACK SURGERY     5 total  . BIOPSY  01/03/2017   Procedure: BIOPSY;  Surgeon: Daneil Dolin, MD;  [facility];  Service: Endoscopy;;  gastric  . COLONOSCOPY WITH PROPOFOL N/A 04/04/2017   Procedure: COLONOSCOPY WITH PROPOFOL;  Surgeon: Daneil Dolin, MD;  [facility];  Service: Endoscopy;  Laterality: N/A;  8:45am  . ESOPHAGOGASTRODUODENOSCOPY (  EGD) WITH PROPOFOL N/A 01/03/2017   Procedure: ESOPHAGOGASTRODUODENOSCOPY (EGD) WITH PROPOFOL;  Surgeon: Daneil Dolin, MD;  [facility];  Service: Endoscopy;  Laterality: N/A;  . Venia Minks DILATION N/A 01/03/2017   Procedure: Venia Minks DILATION;  Surgeon: Daneil Dolin, MD;  [facility];  Service: Endoscopy;  Laterality: N/A;  . MULTIPLE EXTRACTIONS WITH ALVEOLOPLASTY N/A 07/08/2014   Procedure: MULTIPLE EXTRACION WITH ALVEOLOPLASTY with EXCISION LESION RIGHT SIDE OF TONGUE;  Surgeon: Gae Bon, DDS;  Location: [place];  Service: Oral Surgery;  Laterality: N/A;  . PORTACATH PLACEMENT Right 07/30/2016   Procedure: INSERTION PORT-A-CATH;  Surgeon: Vickie Epley, MD;  [facility];  Service: Vascular;  Laterality: Right;  [name] VIDEO BRONCHOSCOPY WITH ENDOBRONCHIAL ULTRASOUND N/A 07/19/2016   Procedure: VIDEO BRONCHOSCOPY WITH ENDOBRONCHIAL ULTRASOUND;  Surgeon: [name], MD;  Location: [place];  Service: Thoracic;  Laterality: N/A;      Social History:     Social History   Tobacco Use  . Smoking status: Current Every Day Smoker    Packs/day: 0.25    Years: 38.00    Pack years:  9.50    Types: Cigarettes  . Smokeless tobacco: Former Systems developer    Types: Chew  . Tobacco comment: using chantix- smoking once in a while  Substance Use Topics  . Alcohol use: Yes    Alcohol/week: 1.2 oz    Types: 2 Cans of beer per week    Comment: reports drinking 0-2 beers a week     Lives - at home  Mobility - walks by self   Family History :     Family History  Problem Relation Age of Onset  . Cancer Mother        breast cancer  . Heart failure Brother   . Colon cancer Neg Hx        not sure, ?grandfather and/or uncle      Home Medications:   Prior to Admission medications   Medication Sig Start Date End Date Taking? Authorizing Provider  albuterol (PROVENTIL HFA;VENTOLIN HFA) 108 (90 Base) MCG/ACT inhaler Inhale 2 puffs into the lungs every 6 (six) hours as needed for wheezing. 12/26/17  Yes Burns, Wandra Feinstein, NP  ALPRAZolam Duanne Moron) 1 MG tablet Take 1 mg by mouth 2 (two) times daily as needed for anxiety or sleep (takes 1 tablet and bedtime for sleep and 1 dose during the day only if needed).  06/26/16  Yes [provider]  amLODipine (NORVASC) 5 MG tablet Take 5 mg by mouth daily.  07/07/16  Yes [provider]  apixaban (ELIQUIS) 5 MG TABS tablet Take 1 tablet (5 mg total) by mouth 2 (two) times daily. 01/23/18  Yes Jacquelin Hawking, NP  Artificial Tear Solution (SOOTHE XP) SOLN Apply 2 drops to eye daily.   Yes [provider]  Atezolizumab (TECENTRIQ IV) Inject into the vein. Every 3 weeks   Yes [provider]  escitalopram (LEXAPRO) 20 MG tablet Take 20 mg by mouth daily.  07/07/16  Yes [provider]  gabapentin (NEURONTIN) 600 MG tablet Take 600 mg by mouth 2 (two) times daily.  08/19/17  Yes [provider]  guaiFENesin (MUCINEX) 600 MG 12 hr tablet Take 600 mg by mouth 2 (two) times daily as needed (for congestion.).   Yes [provider]  hydrochlorothiazide (MICROZIDE) 12.5 MG capsule Take 12.5 mg by  mouth daily.  11/15/17  Yes [provider]  lactulose ([place]) 10  GM/15ML solution Take 15 mLs (10 g total) by mouth 3 (three) times daily as needed for mild constipation or moderate constipation. 01/23/18  Yes Jacquelin Hawking, NP  methadone (DOLOPHINE) 10 MG tablet Take 20 mg by mouth 5 (five) times daily. Takes 2 tablets 5 times daily 06/26/16  Yes [provider]  mirtazapine (REMERON) 15 MG tablet Take 1 tablet by mouth at bedtime. 08/17/17  Yes [provider]  Multiple Vitamin (MULTIVITAMIN WITH MINERALS) TABS tablet Take 1 tablet by mouth daily. Centrum   Yes [provider]  ondansetron (ZOFRAN) 8 MG tablet TAKE 1 TABLET BY MOUTH EVERY 8 HOURS AS NEEDED FOR NAUSEA AND VOMITING. 03/02/18  Yes Holley Bouche, NP  pantoprazole (PROTONIX) 40 MG tablet TAKE (1) TABLET BY MOUTH TWICE DAILY. 02/03/18  Yes Holley Bouche, NP  tiZANidine (ZANAFLEX) 4 MG tablet Take 4 mg by mouth every 8 (eight) hours as needed for muscle spasms.  06/03/16  Yes [provider]  Donnal Debar 100-62.5-25 MCG/INH AEPB  01/11/18  Yes [provider]  varenicline (CHANTIX) 0.5 MG tablet TAKE (2) TABLETS BY MOUTH TWICE DAILY. 02/15/18  Yes Holley Bouche, NP     Allergies:     Allergies  Allergen Reactions  . Demeclocycline Other (See Comments) and Swelling  . Tetracyclines & Related Swelling and Other (See Comments)          Physical Exam:   Vitals  Blood pressure 112/65, pulse (!) 104, temperature 97.7 F (36.5 C), temperature source Oral, resp. rate 11, height 6' (1.829 m), weight 101.2 kg (223 lb), SpO2 97 %.   1. General  lying in bed in NAD,    2. Normal affect and insight, Not Suicidal or Homicidal, Awake Alert, Oriented X 3.  3. No F.N deficits, ALL C.Nerves Intact, Strength 5/5 all 4 extremities, Sensation intact all 4 extremities, Plantars down going.  4. Ears and Eyes appear Normal, Conjunctivae clear, PERRLA. Moist Oral  Mucosa.  5. Supple Neck, No JVD, No cervical lymphadenopathy appriciated, No Carotid Bruits.  6. Symmetrical Chest wall movement,,+ bilateral wheezing, no crackles  7. RRR, No Gallops, Rubs or Murmurs, No Parasternal Heave.  8. Positive Bowel Sounds, Abdomen Soft, No tenderness, No organomegaly appriciated,No rebound -guarding or rigidity.  9.  No Cyanosis, Normal Skin Turgor, No Skin Rash or Bruise.  10. Good muscle tone,  joints appear normal , no effusions, Normal ROM.  11. No Palpable Lymph Nodes in Neck or Axillae     Data Review:    CBC Recent Labs  Lab 03/06/18 1421 03/06/18 1424  WBC 10.4  --   HGB 12.8*  --   HCT 40.0  --   PLT 280  --   MCV 93.5  --   MCH 29.9  --   MCHC 32.0  --   RDW 15.5  --   LYMPHSABS  --  1.6  MONOABS  --  1.0  EOSABS  --  0.0  BASOSABS  --  0.0   ------------------------------------------------------------------------------------------------------------------  Chemistries  Recent Labs  Lab 03/06/18 1421 03/06/18 1424  NA 135  --   K 3.7  --   CL 92*  --   CO2 31  --   GLUCOSE 116*  --   BUN 6  --   CREATININE 0.69  --   CALCIUM 8.6*  --   AST  --  40  ALT  --  26  ALKPHOS  --  213*  BILITOT  --  0.8   ------------------------------------------------------------------------------------------------------------------ estimated creatinine clearance is 128.4 mL/min (by C-G formula based on SCr of 0.69 mg/dL). ------------------------------------------------------------------------------------------------------------------ No results for input(s): TSH, T4TOTAL, T3FREE, THYROIDAB in the last 72 hours.  Invalid input(s): FREET3  Coagulation profile No results for input(s): INR, PROTIME in the last 168 hours. ------------------------------------------------------------------------------------------------------------------- No results for input(s): DDIMER in the last 72  hours. -------------------------------------------------------------------------------------------------------------------  Cardiac Enzymes Recent Labs  Lab 03/06/18 1421  TROPONINI <0.03   ------------------------------------------------------------------------------------------------------------------    Component Value Date/Time   BNP 143.0 (H) 03/06/2018 1426     ---------------------------------------------------------------------------------------------------------------  Urinalysis No results found for: COLORURINE, APPEARANCEUR, LABSPEC, PHURINE, GLUCOSEU, HGBUR, BILIRUBINUR, KETONESUR, PROTEINUR, UROBILINOGEN, NITRITE, LEUKOCYTESUR  ----------------------------------------------------------------------------------------------------------------   Imaging Results:    Dg Chest 2 View  Result Date: 03/06/2018 CLINICAL DATA:  Shortness of breath, pneumonia EXAM: CHEST - 2 VIEW COMPARISON:  CT chest dated 01/28/2018 FINDINGS: Left lung is clear. Volume loss in the right hemithorax. Moderate right pleural effusion. Right lower lobe opacity, likely atelectasis. Prior multifocal right upper lung opacities are significantly improved. Radiation changes in the right perihilar/paramediastinal region. The heart is normal in size. Right chest port terminates the cavoatrial junction. IMPRESSION: Prior multifocal right upper lung opacities are significantly improved. Radiation changes in the right perihilar/paramediastinal region. Right lower lobe opacity, likely atelectasis. Moderate right pleural effusion. Electronically Signed   By: Julian Hy M.D.   On: 03/06/2018 14:43      Assessment & Plan:    Principal Problem:   COPD exacerbation (HCC)    Copd exacerbation Solumedrol 37m iv q6h  Cont Trelegy Albuterol 1 neb po q6h and q6h prn  Zithromax 50626miv qday  PE Cont Eliquis 26m54mo bid  Hypertension Cont hydrochylorothiazide Cont amlodipine  Anxiety Cont Xanax Cont  lexapro  Chronic pain Cont methadone      DVT Prophylaxis Eliquis  AM Labs Ordered, also please review Full Orders  Family Communication: Admission, patients condition and plan of care including tests being ordered have been discussed with the patient  who indicate understanding and agree with the plan and Code Status.  Code Status FULL CODE  Likely DC to  home  Condition GUARDED    Consults called: none  Admission status: inpatient   Time spent in minutes : 45   JamJani GravelD on 03/06/2018 at 6:05 PM  Between 7am to 7pm - Pager - [phone] After 7pm go to [email or web address] - password [email or web address] - Office  [phone]

## 2018-03-07 DIAGNOSIS — I10 Essential (primary) hypertension: Secondary | ICD-10-CM | POA: Diagnosis present

## 2018-03-07 DIAGNOSIS — R739 Hyperglycemia, unspecified: Secondary | ICD-10-CM | POA: Diagnosis present

## 2018-03-07 DIAGNOSIS — T380X5A Adverse effect of glucocorticoids and synthetic analogues, initial encounter: Secondary | ICD-10-CM

## 2018-03-07 LAB — COMPREHENSIVE METABOLIC PANEL
ALT: 23 U/L (ref 17–63)
AST: 30 U/L (ref 15–41)
Albumin: 2 g/dL — ABNORMAL LOW (ref 3.5–5.0)
Alkaline Phosphatase: 179 U/L — ABNORMAL HIGH (ref 38–126)
Anion gap: 8 (ref 5–15)
BILIRUBIN TOTAL: 0.7 mg/dL (ref 0.3–1.2)
BUN: 6 mg/dL (ref 6–20)
CHLORIDE: 98 mmol/L — AB (ref 101–111)
CO2: 32 mmol/L (ref 22–32)
Calcium: 8.3 mg/dL — ABNORMAL LOW (ref 8.9–10.3)
Creatinine, Ser: 0.63 mg/dL (ref 0.61–1.24)
Glucose, Bld: 260 mg/dL — ABNORMAL HIGH (ref 65–99)
Potassium: 3.5 mmol/L (ref 3.5–5.1)
Sodium: 138 mmol/L (ref 135–145)
TOTAL PROTEIN: 6.6 g/dL (ref 6.5–8.1)

## 2018-03-07 LAB — CBC
HCT: 37 % — ABNORMAL LOW (ref 39.0–52.0)
Hemoglobin: 11.5 g/dL — ABNORMAL LOW (ref 13.0–17.0)
MCH: 29.4 pg (ref 26.0–34.0)
MCHC: 31.1 g/dL (ref 30.0–36.0)
MCV: 94.6 fL (ref 78.0–100.0)
Platelets: 215 10*3/uL (ref 150–400)
RBC: 3.91 MIL/uL — ABNORMAL LOW (ref 4.22–5.81)
RDW: 15.7 % — ABNORMAL HIGH (ref 11.5–15.5)
WBC: 6.8 10*3/uL (ref 4.0–10.5)

## 2018-03-07 LAB — GLUCOSE, CAPILLARY
GLUCOSE-CAPILLARY: 197 mg/dL — AB (ref 65–99)
GLUCOSE-CAPILLARY: 325 mg/dL — AB (ref 65–99)
GLUCOSE-CAPILLARY: 241 mg/dL — AB (ref 65–99)
Glucose-Capillary: 154 mg/dL — ABNORMAL HIGH (ref 65–99)

## 2018-03-07 MED ORDER — UMECLIDINIUM BROMIDE 62.5 MCG/INH IN AEPB
1.0000 | INHALATION_SPRAY | Freq: Every day | RESPIRATORY_TRACT | Status: DC
  Administered 2018-03-07 – 2018-03-08 (×2): 1 via RESPIRATORY_TRACT
  Filled 2018-03-07: qty 7

## 2018-03-07 MED ORDER — ALBUTEROL SULFATE (2.5 MG/3ML) 0.083% IN NEBU
2.5000 mg | INHALATION_SOLUTION | Freq: Three times a day (TID) | RESPIRATORY_TRACT | Status: DC
  Administered 2018-03-07: 2.5 mg via RESPIRATORY_TRACT
  Filled 2018-03-07: qty 3

## 2018-03-07 MED ORDER — FLUTICASONE FUROATE-VILANTEROL 100-25 MCG/INH IN AEPB
1.0000 | INHALATION_SPRAY | Freq: Every day | RESPIRATORY_TRACT | Status: DC
  Administered 2018-03-07 – 2018-03-08 (×2): 1 via RESPIRATORY_TRACT
  Filled 2018-03-07: qty 28

## 2018-03-07 MED ORDER — INSULIN ASPART 100 UNIT/ML ~~LOC~~ SOLN
0.0000 [IU] | Freq: Three times a day (TID) | SUBCUTANEOUS | Status: DC
  Administered 2018-03-07: 2 [IU] via SUBCUTANEOUS
  Administered 2018-03-07: 7 [IU] via SUBCUTANEOUS
  Administered 2018-03-07 – 2018-03-08 (×2): 2 [IU] via SUBCUTANEOUS
  Administered 2018-03-08: 3 [IU] via SUBCUTANEOUS

## 2018-03-07 NOTE — Progress Notes (Signed)
Nutrition Brief Note  Patient reported on MST that he had lost weight unintentionally and has been eating poorly due to a decreased appetite  Wt Readings from Last 15 Encounters:  03/07/18 220 lb 7.4 oz (100 kg)  02/16/18 219 lb 3.2 oz (99.4 kg)  02/09/18 209 lb 8 oz (95 kg)  01/29/18 210 lb 12.2 oz (95.6 kg)  01/23/18 224 lb (101.6 kg)  01/02/18 221 lb (100.2 kg)  01/02/18 221 lb (100.2 kg)  12/26/17 212 lb 6.4 oz (96.3 kg)  12/12/17 222 lb 8 oz (100.9 kg)  11/21/17 228 lb 3.2 oz (103.5 kg)  10/31/17 223 lb 4.8 oz (101.3 kg)  10/06/17 232 lb 3.2 oz (105.3 kg)  09/15/17 224 lb (101.6 kg)  08/25/17 213 lb 9.6 oz (96.9 kg)  08/23/17 211 lb (95.7 kg)   On interview, patient refutes either of these statements. He says he eats well at baseline, but he has not been eating as well in the the hospital due to diet restrictions,s saying "I cant eat that food...why cant I have what I had last time I was here...cheeseburgers etc".   Weight wise, he says his weight is "unchanged". Per chart, his weight has fluctuated between 210-230 for the past year.   Current diet order is "Heart Healthy". Given he is a stage IV cancer patient, feel it is appropriate to liberalize diet. He does not have any hx of HF. Discussed w/ MD who was agreeable to diet liberalization  Patient eating well at baseline and weight is quite stable. No nutrition interventions warranted at this time. If nutrition issues arise, please consult RD.   Burtis Junes RD, LDN, CNSC Clinical Nutrition Available Tues-Sat via Pager: [phone] 03/07/2018 1:31 PM

## 2018-03-07 NOTE — Progress Notes (Signed)
PROGRESS NOTE                                                                                                                                                                                                             Patient Demographics:    Erik [name], is a 57 y.o. male, DOB - Sep 14, 1961, [id number]  Admit date - 03/06/2018   Admitting Physician [name], MD  Outpatient Primary MD for the patient is [name], MD  LOS - 1  Outpatient Specialists: [facility] oncology   Chief Complaint  Patient presents with  . Shortness of Breath       Brief Narrative 57 year old male with stage IV adenocarcinoma of the right lung currently on chemotherapy, left lower lobe PE in January 2019 on anticoagulation, COPD not on home O2 who was hospitalized 5 weeks back with lobar pneumonia and acute respiratory failure treated with 2 weeks of antibiotics, presented to the ED with acute COPD exacerbation.   Subjective:   Informs his breathing to be better since admission but still wheezy.   Assessment  & Plan :   Principal problem COPD with acute exacerbation ([place]) Has infrequent symptoms.  Switch to scheduled duo nebs and as needed albuterol neb.  Solu-Medrol 80 mg every 8 hours.  Empiric azithromycin.  Sats stable.  Encouraged on quitting smoking.   Stage IV adenocarcinoma of the lung On chemotherapy.     follows with [facility] oncology  Recently diagnosed pulmonary embolism (January 2019) Continue Eliquis.  Essential hypertension Stable.  Continue amlodipine and HCTZ.  Tobacco abuse Continue Chantix.    Patient reports he has quit smoking since last hospitalization  Chronic pain syndrome Continue methadone and Neurontin.  GERD Continue PPI  Depression Continue Lexapro and Remeron.  Hyperglycemia secondary to steroids Placed on sliding scale coverage.   Code Status : Full code  Family  Communication  : None at bedside  Disposition Plan  : Home once improved, possibly tomorrow  Barriers For Discharge : Active symptoms  Consults  : None  Procedures  : None  DVT Prophylaxis  : Eliquis  Lab Results  Component Value Date   PLT 215 03/07/2018    Antibiotics  :    Anti-infectives (From admission, onward)   Start     Dose/Rate Route Frequency Ordered Stop   03/06/18 2100  azithromycin (  ZITHROMAX) 500 mg in sodium chloride 0.9 % 250 mL IVPB     500 mg 250 mL/hr over 60 Minutes Intravenous Every 24 hours 03/06/18 2016          Objective:   Vitals:   03/06/18 2125 03/07/18 0500 03/07/18 0602 03/07/18 0757  BP:   91/65   Pulse: 92  82   Resp: 18  18   Temp:   98.3 F (36.8 C)   TempSrc:   Oral   SpO2: 94%  97% 97%  Weight:  100 kg (220 lb 7.4 oz)    Height:        Wt Readings from Last 3 Encounters:  03/07/18 100 kg (220 lb 7.4 oz)  02/16/18 99.4 kg (219 lb 3.2 oz)  02/09/18 95 kg (209 lb 8 oz)     Intake/Output Summary (Last 24 hours) at 03/07/2018 1328 Last data filed at 03/07/2018 0900 Gross per 24 hour  Intake 1023 ml  Output 850 ml  Net 173 ml     Physical Exam  Gen: not in distress HEENT:moist mucosa, supple neck Chest: Scattered wheezing and rhonchi bilaterally CVS: N S1&S2, no murmurs,  GI: soft, NT, ND,  Musculoskeletal: warm, no edema     Data Review:    CBC Recent Labs  Lab 03/06/18 1421 03/06/18 1424 03/07/18 0451  WBC 10.4  --  6.8  HGB 12.8*  --  11.5*  HCT 40.0  --  37.0*  PLT 280  --  215  MCV 93.5  --  94.6  MCH 29.9  --  29.4  MCHC 32.0  --  31.1  RDW 15.5  --  15.7*  LYMPHSABS  --  1.6  --   MONOABS  --  1.0  --   EOSABS  --  0.0  --   BASOSABS  --  0.0  --     Chemistries  Recent Labs  Lab 03/06/18 1421 03/06/18 1424 03/07/18 0451  NA 135  --  138  K 3.7  --  3.5  CL 92*  --  98*  CO2 31  --  32  GLUCOSE 116*  --  260*  BUN 6  --  6  CREATININE 0.69  --  0.63  CALCIUM 8.6*  --  8.3*  AST   --  40 30  ALT  --  26 23  ALKPHOS  --  213* 179*  BILITOT  --  0.8 0.7   ------------------------------------------------------------------------------------------------------------------ No results for input(s): CHOL, HDL, LDLCALC, TRIG, CHOLHDL, LDLDIRECT in the last 72 hours.  No results found for: HGBA1C ------------------------------------------------------------------------------------------------------------------ No results for input(s): TSH, T4TOTAL, T3FREE, THYROIDAB in the last 72 hours.  Invalid input(s): FREET3 ------------------------------------------------------------------------------------------------------------------ No results for input(s): VITAMINB12, FOLATE, FERRITIN, TIBC, IRON, RETICCTPCT in the last 72 hours.  Coagulation profile No results for input(s): INR, PROTIME in the last 168 hours.  No results for input(s): DDIMER in the last 72 hours.  Cardiac Enzymes Recent Labs  Lab 03/06/18 1421  TROPONINI <0.03   ------------------------------------------------------------------------------------------------------------------    Component Value Date/Time   BNP 143.0 (H) 03/06/2018 1426    Inpatient Medications  Scheduled Meds: . albuterol  2.5 mg Nebulization TID  . amLODipine  5 mg Oral Daily  . apixaban  5 mg Oral BID  . chlorhexidine  15 mL Mouth Rinse BID  . escitalopram  20 mg Oral Daily  . feeding supplement (ENSURE ENLIVE)  237 mL Oral BID BM  . fluticasone furoate-vilanterol  1 puff  Inhalation Daily   And  . umeclidinium bromide  1 puff Inhalation Daily  . gabapentin  600 mg Oral BID  . hydrochlorothiazide  12.5 mg Oral Daily  . insulin aspart  0-9 Units Subcutaneous TID WC  . mouth rinse  15 mL Mouth Rinse q12n4p  . methadone  20 mg Oral 5 X Daily  . methylPREDNISolone (SOLU-MEDROL) injection  80 mg Intravenous Q8H  . mirtazapine  15 mg Oral QHS  . multivitamin with minerals  1 tablet Oral Daily  . pantoprazole  40 mg Oral BID  .  polyvinyl alcohol  2 drop Both Eyes Daily  . sodium chloride flush  3 mL Intravenous Q12H  . varenicline  1 mg Oral BID   Continuous Infusions: . sodium chloride    . azithromycin Stopped (03/06/18 2301)   PRN Meds:.sodium chloride, acetaminophen **OR** acetaminophen, albuterol, ALPRAZolam, guaiFENesin, lactulose, sodium chloride flush, tiZANidine  Micro Results No results found for this or any previous visit (from the past 240 hour(s)).  Radiology Reports Dg Chest 2 View  Result Date: 03/06/2018 CLINICAL DATA:  Shortness of breath, pneumonia EXAM: CHEST - 2 VIEW COMPARISON:  CT chest dated 01/28/2018 FINDINGS: Left lung is clear. Volume loss in the right hemithorax. Moderate right pleural effusion. Right lower lobe opacity, likely atelectasis. Prior multifocal right upper lung opacities are significantly improved. Radiation changes in the right perihilar/paramediastinal region. The heart is normal in size. Right chest port terminates the cavoatrial junction. IMPRESSION: Prior multifocal right upper lung opacities are significantly improved. Radiation changes in the right perihilar/paramediastinal region. Right lower lobe opacity, likely atelectasis. Moderate right pleural effusion. Electronically Signed   By: Julian Hy M.D.   On: 03/06/2018 14:43    Time Spent in minutes  25   Keimari Angola M.D on 03/07/2018 at 1:28 PM  Between 7am to 7pm - Pager - [phone]  After 7pm go to [email or web address] - password [facility]  [facility] -  Office  [phone]

## 2018-03-08 DIAGNOSIS — J9621 Acute and chronic respiratory failure with hypoxia: Secondary | ICD-10-CM

## 2018-03-08 DIAGNOSIS — J9601 Acute respiratory failure with hypoxia: Secondary | ICD-10-CM

## 2018-03-08 DIAGNOSIS — K210 Gastro-esophageal reflux disease with esophagitis: Secondary | ICD-10-CM

## 2018-03-08 DIAGNOSIS — C3491 Malignant neoplasm of unspecified part of right bronchus or lung: Secondary | ICD-10-CM

## 2018-03-08 DIAGNOSIS — I10 Essential (primary) hypertension: Secondary | ICD-10-CM

## 2018-03-08 DIAGNOSIS — J441 Chronic obstructive pulmonary disease with (acute) exacerbation: Principal | ICD-10-CM

## 2018-03-08 DIAGNOSIS — J471 Bronchiectasis with (acute) exacerbation: Secondary | ICD-10-CM

## 2018-03-08 LAB — GLUCOSE, CAPILLARY
GLUCOSE-CAPILLARY: 157 mg/dL — AB (ref 65–99)
Glucose-Capillary: 230 mg/dL — ABNORMAL HIGH (ref 65–99)

## 2018-03-08 LAB — HIV ANTIBODY (ROUTINE TESTING W REFLEX): HIV SCREEN 4TH GENERATION: NONREACTIVE

## 2018-03-08 MED ORDER — TRELEGY ELLIPTA 100-62.5-25 MCG/INH IN AEPB
1.0000 | INHALATION_SPRAY | Freq: Two times a day (BID) | RESPIRATORY_TRACT | Status: DC
Start: 2018-03-08 — End: 2018-03-14

## 2018-03-08 MED ORDER — ALBUTEROL SULFATE (2.5 MG/3ML) 0.083% IN NEBU: 2.5000 mg | INHALATION_SOLUTION | Freq: Four times a day (QID) | RESPIRATORY_TRACT | 4 refills | Status: AC | PRN

## 2018-03-08 MED ORDER — IPRATROPIUM-ALBUTEROL 20-100 MCG/ACT IN AERS: 1.0000 | INHALATION_SPRAY | Freq: Four times a day (QID) | RESPIRATORY_TRACT | 3 refills | Status: AC | PRN

## 2018-03-08 MED ORDER — PREDNISONE 20 MG PO TABS: ORAL_TABLET | ORAL | 0 refills | Status: DC

## 2018-03-08 MED ORDER — CEFDINIR 300 MG PO CAPS: 300.0000 mg | ORAL_CAPSULE | Freq: Two times a day (BID) | ORAL | 0 refills | Status: AC

## 2018-03-08 NOTE — Progress Notes (Signed)
SATURATION QUALIFICATIONS: (This note is used to comply with regulatory documentation for home oxygen)  Patient Saturations on Room Air at Rest = 87   Patient Saturations on Room Air while Ambulating = 80%  Patient Saturations on 2 Liters of oxygen while Ambulating = 96%  Please briefly explain why patient needs home oxygen:

## 2018-03-08 NOTE — Discharge Summary (Signed)
Physician Discharge Summary  Erik [name] [id number] DOB: 04/20/1961 DOA: 03/06/2018  PCP: [name], MD  Admit date: 03/06/2018 Discharge date: 03/08/2018  Time spent: 35 minutes  Recommendations for Outpatient Follow-up:  1. Repeat basic metabolic panel to follow electrolytes and renal function 2. Reassess patient's CBGs once steroids completely tapered off (he was experiencing steroid-induced hyperglycemia while in the hospital). 3. Reassess patient oxygen supplementation needs and if possible discontinue home oxygen.   Discharge Diagnoses:  Principal Problem:   COPD exacerbation ([place]) Active Problems:   GERD   Adenocarcinoma of right lung ([place])   Essential hypertension   Steroid-induced hyperglycemia   Bronchiectasis with acute exacerbation ([place])   Acute respiratory failure with hypoxia ([place])   Discharge Condition: Stable and improved.  Patient discharged home with instruction to follow-up with PCP in 10 days.  Diet recommendation: Heart healthy diet  Filed Weights   03/06/18 2018 03/07/18 0500 03/08/18 0500  Weight: 100.6 kg (221 lb 12.5 oz) 100 kg (220 lb 7.4 oz) 100.1 kg (220 lb 10.9 oz)    History of present illness:  As per H&P written by Dr. Maudie Mercury on 4/100/19. Erik [name]  is a 57 y.o. 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  Denies fever, chills, cp, palp, n/v, diarrhea, brbpr, black stool.    In ED: CXR demonstrated (Prior multifocal right upper lung opacities are significantly Improved. Radiation changes in the right perihilar/paramediastinal region. Right lower lobe opacity, likely atelectasis. Moderate right pleural effusion).  Trop <0.03 Wbc 10.4, Hgb 12.8, Plt 280 Na 135, K 3.7, Bun 6, Creatinine 0.69 Glucose 116 Ast 40, Alt 26, alk phos 213, T. Bili 0.8 BNP 143  Pt was admitted for Copd exacerbation   Hospital Course:  1-COPD with acute exacerbation and  bronchiectasis -Significantly improved, even is still requiring oxygen supplementation at discharge to maintain O2 sats above 90%. -Patient was able to speak in full sentences, afebrile and looking to go home. -He was discharged on cefdinir to complete 5 more days of antibiotics, steroids tapering, resumption of Trelegy (now BID), combivent inhaler as needed and also rescue albuterol nebulizer. -Oxygen supplementation was arranged for patient to go home with -Will continue using Mucinex to assist with expectoration. -Patient will follow-up with PCP in 10 days and will also benefit him for the follow-up as an outpatient with pulmonologist.  2-stage IV adenocarcinoma of the lungs -Actively on chemotherapy -Continue current regimen -Continue Follow-up with rates below oncology after discharge  3-recently diagnosed pulmonary embolism -Continue Eliquis -Patient denies chest pain.  4-essential hypertension -Overall stable -Continue home antihypertensive regimen -Patient advised to follow heart healthy diet.  5-tobacco abuse -Patient reports that he has quit smoking since his last hospitalization -He has been congratulated and encouraged to continue to be tobacco free -Will recommend continue the use of Chantix -Patient advised to avoid secondhand smoking as well.  6-GERD -Continue PPI  7-depression/Anxiety -Continue Lexapro, Remeron and as needed Xanax. -Mood overall stable and currently without any active suicidal ideation or hallucinations.  8-hyperglycemia secondary to steroids use -Anticipate resolution of elevated sugar once steroids tapering terminated -Follow CVG as an outpatient.  Procedures:  See below for x-ray reports.  Consultations:  None  Discharge Exam: Vitals:   03/08/18 0601 03/08/18 0850  BP: 109/77   Pulse: 77   Resp:    Temp: 97.8 F (36.6 C)   SpO2: 100% (!) 87%  General: Afebrile, denies chest pain, able to speak in full sentences and  reporting improvement in his breathing.  Still with some wheezing and requiring oxygen supplementation, but asking to go home and feeling good. Cardiovascular: S1 and S2, no rubs, no gallops, no JVD. Respiratory: Mild expiratory wheezing bilaterally, positive scattered rhonchi, overall improved air movement, no crackles, no using accessory muscles. Abdomen: Soft, nontender, nondistended, positive bowel sounds Extremities: No edema, no cyanosis or clubbing.  Discharge Instructions   Discharge Instructions    Diet - low sodium heart healthy   Complete by:  As directed    Discharge instructions   Complete by:  As directed    Keep yourself well-hydrated Avoid secondhand smoking Minimize lawn outdoor exposure during active spring use oxygen supplementation as instructed Use steroids tapering, inhalers and nebulizer machine as prescribed.  Arrange follow-up with PCP in 10 days.     Allergies as of 03/08/2018      Reactions   Demeclocycline Other (See Comments), Swelling   Tetracyclines & Related Swelling, Other (See Comments)         Medication List    STOP taking these medications   albuterol 108 (90 Base) MCG/ACT inhaler Commonly known as:  PROVENTIL HFA;VENTOLIN HFA Replaced by:  albuterol (2.5 MG/3ML) 0.083% nebulizer solution     TAKE these medications   albuterol (2.5 MG/3ML) 0.083% nebulizer solution Commonly known as:  PROVENTIL Take 3 mLs (2.5 mg total) by nebulization every 6 (six) hours as needed for wheezing or shortness of breath (if unable to experience relief by using combivent inhaler). Replaces:  albuterol 108 (90 Base) MCG/ACT inhaler   ALPRAZolam 1 MG tablet Commonly known as:  XANAX Take 1 mg by mouth 2 (two) times daily as needed for anxiety or sleep (takes 1 tablet and bedtime for sleep and 1 dose during the day only if needed).   amLODipine 5 MG tablet Commonly known as:  NORVASC Take 5 mg by mouth daily.   apixaban 5 MG Tabs tablet Commonly known as:   ELIQUIS Take 1 tablet (5 mg total) by mouth 2 (two) times daily.   cefdinir 300 MG capsule Commonly known as:  OMNICEF Take 1 capsule (300 mg total) by mouth 2 (two) times daily for 5 days.   escitalopram 20 MG tablet Commonly known as:  LEXAPRO Take 20 mg by mouth daily.   gabapentin 600 MG tablet Commonly known as:  NEURONTIN Take 600 mg by mouth 2 (two) times daily.   guaiFENesin 600 MG 12 hr tablet Commonly known as:  MUCINEX Take 600 mg by mouth 2 (two) times daily as needed (for congestion.).   hydrochlorothiazide 12.5 MG capsule Commonly known as:  MICROZIDE Take 12.5 mg by mouth daily.   Ipratropium-Albuterol 20-100 MCG/ACT Aers respimat Commonly known as:  COMBIVENT Inhale 1 puff into the lungs every 6 (six) hours as needed for wheezing or shortness of breath.   lactulose 10 GM/15ML solution Commonly known as:  CHRONULAC Take 15 mLs (10 g total) by mouth 3 (three) times daily as needed for mild constipation or moderate constipation.   methadone 10 MG tablet Commonly known as:  DOLOPHINE Take 20 mg by mouth 5 (five) times daily. Takes 2 tablets 5 times daily   mirtazapine 15 MG tablet Commonly known as:  REMERON Take 1 tablet by mouth at bedtime.   multivitamin with minerals Tabs tablet Take 1 tablet by mouth daily. Centrum   ondansetron 8 MG tablet Commonly known as:  ZOFRAN TAKE 1  TABLET BY MOUTH EVERY 8 HOURS AS NEEDED FOR NAUSEA AND VOMITING.   pantoprazole 40 MG tablet Commonly known as:  PROTONIX TAKE (1) TABLET BY MOUTH TWICE DAILY.   predniSONE 20 MG tablet Commonly known as:  DELTASONE Take 3 tablets by mouth daily times 1 day; then 2 tablets by mouth daily times 2 days; then 1 tablet by mouth daily times 2 days; then half tablet by mouth daily times 3 days and stop prednisone.   SOOTHE XP Soln Apply 2 drops to eye daily.   TECENTRIQ IV Inject into the vein. Every 3 weeks   tiZANidine 4 MG tablet Commonly known as:  ZANAFLEX Take 4 mg by  mouth every 8 (eight) hours as needed for muscle spasms.   TRELEGY ELLIPTA 100-62.5-25 MCG/INH Aepb Generic drug:  Fluticasone-Umeclidin-Vilant Inhale 1 puff into the lungs 2 (two) times daily. What changed:    how much to take  how to take this  when to take this   varenicline 0.5 MG tablet Commonly known as:  CHANTIX TAKE (2) TABLETS BY MOUTH TWICE DAILY.            Durable Medical Equipment  (From admission, onward)        Start     Ordered   03/08/18 1244  For home use only DME oxygen  Once    Question Answer Comment  Mode or (Route) Nasal cannula   Liters per Minute 2   Frequency Continuous (stationary and portable oxygen unit needed)   Oxygen conserving device Yes   Oxygen delivery system Gas      03/08/18 1243   03/08/18 1243  For home use only DME Nebulizer machine  Once    Question:  Patient needs a nebulizer to treat with the following condition  Answer:  COPD (chronic obstructive pulmonary disease) ([place])   03/08/18 1243     Allergies  Allergen Reactions  . Demeclocycline Other (See Comments) and Swelling  . Tetracyclines & Related Swelling and Other (See Comments)        Follow-up Information    [name], MD. Schedule an appointment as soon as possible for a visit in 10 day(s).   Specialty:  Internal Medicine Contact information: [address] [place] [address] [phone]           The results of significant diagnostics from this hospitalization (including imaging, microbiology, ancillary and laboratory) are listed below for reference.    Significant Diagnostic Studies: Dg Chest 2 View  Result Date: 03/06/2018 CLINICAL DATA:  Shortness of breath, pneumonia EXAM: CHEST - 2 VIEW COMPARISON:  CT chest dated 01/28/2018 FINDINGS: Left lung is clear. Volume loss in the right hemithorax. Moderate right pleural effusion. Right lower lobe opacity, likely atelectasis. Prior multifocal right upper lung opacities are  significantly improved. Radiation changes in the right perihilar/paramediastinal region. The heart is normal in size. Right chest port terminates the cavoatrial junction. IMPRESSION: Prior multifocal right upper lung opacities are significantly improved. Radiation changes in the right perihilar/paramediastinal region. Right lower lobe opacity, likely atelectasis. Moderate right pleural effusion. Electronically Signed   By: Julian Hy M.D.   On: 03/06/2018 14:43   Labs: Basic Metabolic Panel: Recent Labs  Lab 03/06/18 1421 03/07/18 0451  NA 135 138  K 3.7 3.5  CL 92* 98*  CO2 31 32  GLUCOSE 116* 260*  BUN 6 6  CREATININE 0.69 0.63  CALCIUM 8.6* 8.3*   Liver Function Tests: Recent Labs  Lab 03/06/18  1424 03/07/18 0451  AST 40 30  ALT 26 23  ALKPHOS 213* 179*  BILITOT 0.8 0.7  PROT 6.7 6.6  ALBUMIN 2.1* 2.0*   CBC: Recent Labs  Lab 03/06/18 1421 03/06/18 1424 03/07/18 0451  WBC 10.4  --  6.8  NEUTROABS  --  7.6  --   HGB 12.8*  --  11.5*  HCT 40.0  --  37.0*  MCV 93.5  --  94.6  PLT 280  --  215   Cardiac Enzymes: Recent Labs  Lab 03/06/18 1421  TROPONINI <0.03   BNP: BNP (last 3 results) Recent Labs    03/06/18 1426  BNP 143.0*   CBG: Recent Labs  Lab 03/07/18 1120 03/07/18 1722 03/07/18 2114 03/08/18 0720 03/08/18 1145  GLUCAP 325* 154* 241* 157* 230*    Signed:  Barton Dubois MD.  [facility] 03/08/2018, 2:35 PM

## 2018-03-08 NOTE — Care Management (Signed)
Patient Information   Patient Name Erik [name], Erik [name] ([id number]) Sex Male DOB 1961/01/29  Room Bed  A338 A338-01  Patient Demographics   Address [place] [address] Phone [phone] [facility]) [phone] (Mobile) *Preferred* E-mail Address [email or web address]  Patient Ethnicity & Race   Ethnic Group Patient Race  Not Hispanic or Latino White or Caucasian  Emergency Contact(s)   Name Relation Home Work [place] Sister [phone]  [place] [phone]  [phone]  Documents on File    Status Date Received Description  Documents for the Patient  EMR Medication Summary Not Received    EMR Problem Summary Not Received    EMR Patient Summary Not Received    Release of Information Not Received    [place] Received 04/11/11   [place] E-Signature HIPAA Notice of Privacy Received 03/21/12   [place] E-Signature HIPAA Notice of Privacy Spanish Not Received    Driver's License Not Received  [place] drivers licence/ss/aped  Insurance Card Not Received  medicare/ss/aped  Advance Directives/Living Will/HCPOA/POA Not Received    [place] Not Received    Financial Application Not Received    HIM ROI Authorization Not Received    Release of Information Not Received    HIM ROI Authorization  07/11/13   Release of Information  07/12/13   HIM ROI Authorization (Expired) 08/01/13 Need [facility] records from 12/06/2008 to present.  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-[facility]  Other Photo ID Not Received    Insurance Card Received 12/10/16 medicaid  Insurance Card Received 12/10/16 MEDICARE  AMB Correspondence  07/05/16 REFERRING NOTE/ DR,MCINN[facility]  AMB Correspondence  07/02/16 OFFFICE NOTE/ DR [facility]  AMB Correspondence  06/21/16 [facility]   AMB Correspondence  08/13/16 5/17-7/17 OFFICE NOTE [facility]  AMB Correspondence  07/27/16 NTS SOAP NOTE/ [facility]  AMB Correspondence  07/27/16 OFFICE NOTE [facility]  AMB Correspondence  10/04/16 LETTER SILVERSCRIPT  Release of Information Received 12/10/16 [facility]  HIM ROI Authorization  12/10/16   Insurance Card Received 01/13/17   HIM ROI Authorization  02/24/17   AMB Correspondence  10/27/16 FOLLOWUP NOTE/ [facility]  AMB Correspondence  10/20/16 ENDOFTREATMENTNOTE/[facility].  HIM ROI Authorization  03/08/17   Consent Form   thoracentesis  HIM ROI Authorization (Expired) 10/07/17 Jenne Pane. [place]  AMB Correspondence Received 01/06/18 LETTER SILVERSCRIPT  AMB Outside Hospital Record Received 03/02/18 03/02/18 OFFICE [facility]  Patient Photo   Photo of Patient  HIM Release of Information Output  10/12/17 AMB Encounter Progress Notes and Labs, Flowsheets, Pathology Report, Orders/Results Individual  Documents for the Encounter  AOB (Assignment of Insurance Benefits) Not Received    E-signature AOB Signed 03/06/18   MEDICARE RIGHTS Not Received    E-signature Medicare Rights Signed 03/06/18   ED Patient Billing Extract   ED PB Billing Extract  Cardiac Monitoring Strip Shift Summary Received 03/06/18   Cardiac Monitoring Strip Received 03/06/18   EKG Received 03/07/18   Admission Information   Attending Provider Admitting Provider Admission Type Admission Date/Time  Barton Dubois, MD [name], MD Emergency 03/06/18 1409  Discharge Date Hospital Service Auth/Cert Status Service Area   Internal Medicine Incomplete [facility]  Unit Room/Bed Admission Status   AP-DEPT 300 A338/A338-01 Admission (Confirmed)   Admission   Complaint  SOB, LEGS AND  FEET [facility] OF LUNG [facility] Account   Name Acct ID Class Status Primary Coverage  Erik [name], Erik [name] [id number] Inpatient Open  MEDICARE - MEDICARE PART A AND B      Guarantor Account (for Hospital Account [id number])   Name Relation to Pt Service Area Active? Acct Type  Erik [name] Yes Personal/Family  Address Phone    [address] [place], [place] [address] [phone])        Coverage Information (for Hospital Account [id number])   1. MEDICARE/MEDICARE PART A AND B   F/O Payor/Plan Precert #  MEDICARE/MEDICARE PART A AND B   Subscriber Subscriber #  Erik [name], Erik [name] [id number]  Address Phone  [address] [place], [place] [address]   2. MEDICAID [place]/MEDICAID OF [place]   F/O Payor/Plan Precert #  MEDICAID [place]/MEDICAID OF [place]   Subscriber Subscriber #  Erik [name], Erik [name] [id number]  Address Phone  [address] [place], [place] [address] [phone]

## 2018-03-08 NOTE — Care Management Note (Signed)
Case Management Note  Patient Details  Name: Erik [name] MRN: [id number] Date of Birth: 1961-07-02  Subjective/Objective:    Admitted with COPD exacerbation. Pt from home, ind with ADL's has PCP, transportation and insurance with drug coverage.  This is his second admission in 6 months.              Action/Plan: DC home today with self care. Pt will need home oxygen and neb machine. He request [place] for DME needs. CM has faxed info, verified receipt via phone and pt will have portable tank delivered to hospital prior to DC.   Expected Discharge Date:       03/08/2018           Expected Discharge Plan:  Home/Self Care  In-House Referral:  NA  Discharge planning Services  CM Consult  Post Acute Care Choice:  Durable Medical Equipment Choice offered to:  Patient  DME Arranged:  Oxygen, Nebulizer machine DME Agency:  [facility].  Status of Service:  Completed, signed off  If discussed at [place] of Stay Meetings, dates discussed:    Additional Comments:  Sherald Barge, RN 03/08/2018, 2:08 PM

## 2018-03-08 NOTE — Care Management Important Message (Signed)
Important Message  Patient Details  Name: DESHAUN WEISINGER MRN: [id number] Date of Birth: 02/07/1961   Medicare Important Message Given:  Yes    Shelda Altes 03/08/2018, 11:33 AM

## 2018-03-08 NOTE — Progress Notes (Signed)
Inpatient Diabetes Program Recommendations  AACE/ADA: New Consensus Statement on Inpatient Glycemic Control (2015)  Target Ranges:  Prepandial:   less than 140 mg/dL      Peak postprandial:   less than 180 mg/dL (1-2 hours)      Critically ill patients:  140 - 180 mg/dL  Results for Erik [name], Erik [name] (MRN [id number]) as of 03/08/2018 08:03  Ref. Range 03/07/2018 08:00 03/07/2018 11:20 03/07/2018 17:22 03/07/2018 21:14 03/08/2018 07:20  Glucose-Capillary Latest Ref Range: 65 - 99 mg/dL 197 (H) 325 (H) 154 (H) 241 (H) 157 (H)    Review of Glycemic Control  Diabetes history: No Outpatient Diabetes medications: NA Current orders for Inpatient glycemic control: Novolog 0-9 units TID with meals   Inpatient Diabetes Program Recommendations:  Insulin - Meal Coverage: Post prandial glucose is consistently elevated. If steroids are continued may want to order Novolog 3 units TID with meals for meal coverage if patient eats at least 50% of meals.  Thanks, Barnie Alderman, RN, MSN, CDE Diabetes Coordinator Inpatient Diabetes Program [phone] (Team Pager from 8am to 5pm)

## 2018-03-09 ENCOUNTER — Ambulatory Visit (HOSPITAL_COMMUNITY): Payer: Medicare Other

## 2018-03-09 ENCOUNTER — Ambulatory Visit (HOSPITAL_COMMUNITY): Payer: Medicare Other | Admitting: Hematology

## 2018-03-09 ENCOUNTER — Other Ambulatory Visit (HOSPITAL_COMMUNITY): Payer: Medicare Other

## 2018-03-14 ENCOUNTER — Inpatient Hospital Stay (HOSPITAL_COMMUNITY): Payer: Medicare Other | Attending: Hematology and Oncology | Admitting: Hematology

## 2018-03-14 ENCOUNTER — Encounter (HOSPITAL_COMMUNITY): Payer: Self-pay | Admitting: Hematology

## 2018-03-14 VITALS — BP 131/91 | HR 113 | Temp 98.2°F | Resp 16 | Wt 219.6 lb

## 2018-03-14 DIAGNOSIS — Z86711 Personal history of pulmonary embolism: Secondary | ICD-10-CM

## 2018-03-14 DIAGNOSIS — K7460 Unspecified cirrhosis of liver: Secondary | ICD-10-CM | POA: Insufficient documentation

## 2018-03-14 DIAGNOSIS — F17210 Nicotine dependence, cigarettes, uncomplicated: Secondary | ICD-10-CM | POA: Diagnosis not present

## 2018-03-14 DIAGNOSIS — F329 Major depressive disorder, single episode, unspecified: Secondary | ICD-10-CM | POA: Diagnosis not present

## 2018-03-14 DIAGNOSIS — J90 Pleural effusion, not elsewhere classified: Secondary | ICD-10-CM | POA: Diagnosis not present

## 2018-03-14 DIAGNOSIS — C3431 Malignant neoplasm of lower lobe, right bronchus or lung: Secondary | ICD-10-CM | POA: Diagnosis present

## 2018-03-14 DIAGNOSIS — Z7901 Long term (current) use of anticoagulants: Secondary | ICD-10-CM | POA: Diagnosis not present

## 2018-03-14 DIAGNOSIS — Z79899 Other long term (current) drug therapy: Secondary | ICD-10-CM | POA: Diagnosis not present

## 2018-03-14 DIAGNOSIS — C3491 Malignant neoplasm of unspecified part of right bronchus or lung: Secondary | ICD-10-CM

## 2018-03-14 DIAGNOSIS — I10 Essential (primary) hypertension: Secondary | ICD-10-CM | POA: Diagnosis not present

## 2018-03-14 DIAGNOSIS — M48061 Spinal stenosis, lumbar region without neurogenic claudication: Secondary | ICD-10-CM | POA: Diagnosis not present

## 2018-03-14 DIAGNOSIS — Z9221 Personal history of antineoplastic chemotherapy: Secondary | ICD-10-CM

## 2018-03-14 DIAGNOSIS — K219 Gastro-esophageal reflux disease without esophagitis: Secondary | ICD-10-CM | POA: Insufficient documentation

## 2018-03-14 DIAGNOSIS — Z923 Personal history of irradiation: Secondary | ICD-10-CM

## 2018-03-14 DIAGNOSIS — M7989 Other specified soft tissue disorders: Secondary | ICD-10-CM | POA: Diagnosis not present

## 2018-03-14 MED ORDER — POTASSIUM CHLORIDE CRYS ER 20 MEQ PO TBCR: 20.0000 meq | EXTENDED_RELEASE_TABLET | Freq: Every day | ORAL | 1 refills | Status: DC

## 2018-03-14 MED ORDER — FUROSEMIDE 20 MG PO TABS: 20.0000 mg | ORAL_TABLET | Freq: Every day | ORAL | 1 refills | Status: DC

## 2018-03-14 NOTE — Addendum Note (Signed)
Addended by: Donnie Aho on: 03/14/2018 05:07 PM   Modules accepted: Orders

## 2018-03-14 NOTE — Assessment & Plan Note (Signed)
1.  Stage IV adenocarcinoma of the right lung: -Patient had recent hospitalization from COPD exacerbation and discharged on 03/08/2018.  Reports that he is still feeling weak.  He fell about 3 times after discharge, reports "out of balance".  Denies any loss of consciousness.  Denies any headaches or vision changes.  Denies any nausea or vomiting.  He is currently on Tecentriq once every 3 weeks.  I would hold off on treatment today.  I would obtain MRI of the brain with and without contrast.  We will see him back after the MRI.  His last scans were done towards the end of January 2019 did not show any progression.  The findings were not suggestive of any pneumonitis.  We will consider repeating staging scans end of this month.  2.  Lower extremity swelling: He has lower extremity swelling up to the level of knees.  I will start him on Lasix 20 mg daily, slowly titrated up to 40 mg once a day as needed.  As his potassium is borderline at 3.5, we will also start him on potassium 20 mEq daily.  The fluid loculation could be from his underlying cirrhosis.  We will obtain a 2D echocardiogram to evaluate his ejection fraction.  3.  Pulmonary embolism: He is currently on Eliquis which she is tolerating reasonably well.  He has occasional blood-tinged sputum when he coughs.

## 2018-03-14 NOTE — Patient Instructions (Signed)
[place] at [facility] Discharge Instructions  Seen by Dr. Raliegh Ip today. Will be setting up a brain scan and 2-D Echo.  Will start you on lasix(fluid pill) take 20 mg QD, if you are not urinating more with the fluid pill then take 2 pills a day till the swelling gets better.  Also take potassium 20 meq once a day.  Follow up with labs after scans.  Will send in scripts to [facility].    Thank you for choosing [place] at [facility] to provide your oncology and hematology care.  To afford each patient quality time with our provider, please arrive at least 15 minutes before your scheduled appointment time.   If you have a lab appointment with the [facility] please come in thru the  Main Entrance and check in at the main information desk  You need to re-schedule your appointment should you arrive 10 or more minutes late.  We strive to give you quality time with our providers, and arriving late affects you and other patients whose appointments are after yours.  Also, if you no show three or more times for appointments you may be dismissed from the clinic at the providers discretion.     Again, thank you for choosing [facility].  Our hope is that these requests will decrease the amount of time that you wait before being seen by our physicians.       _____________________________________________________________  Should you have questions after your visit to [facility], please contact our office at [phone] between the hours of 8:30 a.m. and 4:30 p.m.  Voicemails left after 4:30 p.m. will not be returned until the following business day.  For prescription refill requests, have your pharmacy contact our office.       Resources For Cancer Patients and their Caregivers ? American Cancer Society: Can assist with transportation, wigs, general needs, runs Look Good Feel Better.        386-303-9298 ? Cancer  Care: Provides financial assistance, online support groups, medication/co-pay assistance.  1-800-813-HOPE 734-819-3477) ? [facility] Assists [place] cancer patients and their families through emotional , educational and financial support.  (613)406-9888 ? [place] DSS Where to apply for food stamps, Medicaid and utility assistance. 667 092 7137 ? [facility]: Transportation to medical appointments. 938-552-8630 ? Social Security Administration: May apply for disability if have a Stage IV cancer. 949-856-2300 (212) 500-7764 ? [employer], Disability and Transit Services: Assists with nutrition, care and transit needs. [place] Support Programs:   > Cancer Support Group  2nd Tuesday of the month 1pm-2pm, Journey Room   > Creative Journey  3rd Tuesday of the month 1130am-1pm, Journey Room

## 2018-03-14 NOTE — Progress Notes (Signed)
[place], [place] [address]   CLINIC:  Medical Oncology/Hematology  PCP:  [name], MD [address] [place] [address] [phone]   REASON FOR VISIT:  Follow-up for lung cancer.  CURRENT THERAPY: Tecentriq every 3 weeks.  BRIEF ONCOLOGIC HISTORY:    Adenocarcinoma of right lung ([place])   07/01/2016 PET scan    6.3 x 3.7 cm right lower lobe mass, suspicious for primary bronchogenic neoplasm, as described above. Hypermetabolic thoracic nodal metastases, as above. Additional right perihilar hypermetabolism, indeterminate. Associated right middle lobe atelectasis/collapse.      07/19/2016 Procedure    Bronchoscopy with brushings and biopsies and endobronchial ultrasound with mediastinal lymph node aspirations by Dr. Roxan Hockey      07/21/2016 Pathology Results    Lung, biopsy, Right Middle Lobe - LUNG TISSUE WITH SQUAMOUS METAPLASIA. - NO MALIGNANCY IDENTIFIED.      07/21/2016 Pathology Results    FINE NEEDLE ASPIRATION, ENDOSCOPIC (A) LEVEL 7 (SPECIMEN 1 OF 3, COLLECTED ON 07/19/16): MALIGNANT CELLS CONSISTENT WITH ADENOCARCINOMA.      07/21/2016 Pathology Results    FINE NEEDLE ASPIRATION, EBUS, 4R, B (SPECIMEN 2 OF 3, COLLECTED 07/19/16): MALIGNANT CELLS CONSISTENT WITH ADENOCARCINOMA.      07/21/2016 Pathology Results    FINE NEEDLE ASPIRATION, EBUS, BRUSHING, RIGHT MIDDLE LOBE, D (SPECIMEN 3 OF 3, COLLECTED 07/19/16): MALIGNANT CELLS CONSISTENT WITH ADENOCARCINOMA.      07/22/2016 Imaging    MRI brain- No evidence of intracranial metastases.      08/17/2016 - 09/20/2016 Chemotherapy    The patient had palonosetron (ALOXI) injection 0.25 mg, 0.25 mg, Intravenous,  Once, 1 of 1 cycle  CISplatin (PLATINOL) 123 mg in sodium chloride 0.9 % 500 mL chemo infusion, 50 mg/m2 = 123 mg, Intravenous,  Once, 1 of 1 cycle  etoposide (VEPESID) 120 mg in sodium chloride 0.9 % 500 mL chemo infusion, 50 mg/m2 = 120 mg,  Intravenous,  Once, 1 of 1 cycle  fosaprepitant (EMEND) 150 mg, dexamethasone (DECADRON) 12 mg in sodium chloride 0.9 % 145 mL IVPB, , Intravenous,  Once, 1 of 1 cycle  ondansetron (ZOFRAN) 8 mg in sodium chloride 0.9 % 50 mL IVPB, , Intravenous,  Once, 2 of 6 cycles  for chemotherapy treatment.        08/17/2016 -  Radiation Therapy         11/15/2016 Imaging    CT CAP- Interval response to therapy. The previously demonstrated mediastinal and right hilar adenopathy and resulting right middle lobe atelectasis have all improved. 2. No discrete residual lung masses are identified. There is new multifocal ground-glass opacity within the right lower lobe, and to a lesser extent in the left upper lobe. These are probably inflammatory/treatment related. 3. No evidence of abdominopelvic metastatic disease. Stable prominent lymph nodes in the upper abdomen, likely reactive. 4. Decompressed mid SVC without specific signs of SVC occlusion.      12/02/2016 -  Chemotherapy    Imfinzi (durvalumab) immunotherapy every 2 weeks x up to 1 year      01/03/2017 Procedure    EGD by Dr. [name], colonoscopy aborted as "patient forgot to take other half of preparation). Esophagitis. likely radiation-induced ?Dilated.  Erythematous mucosa in the stomach. Biopsied. Normal duodenal bulb and second portion of the duodenum.      02/14/2017 Imaging    CT chest- 1. Development of a small to moderate right-sided pleural effusion with minimal loculation anteriorly. 2. Worsened right-sided aeration  with right middle and lower lobe consolidation. As this has a geographic distribution, this could be radiation induced or represent infection. Depending on clinical concern of progressive disease, thoracentesis and/or PET may be informative. 3. Development of mild right paratracheal adenopathy, most likely reactive. Recommend attention on follow-up. 4. Subtle findings which are highly suspicious for mild  cirrhosis. Upper abdominal adenopathy is similar and likely reactive. 5. Persistent right lower and improved left upper lobe ground-glass opacities are favored to be infectious or inflammatory.      06/01/2017 Imaging    CT chest  IMPRESSION: 1. Evolving radiation changes in the medial right hemithorax. 2. Stable moderate size right pleural effusion without definite nodular components. 3. No signs of local recurrence or progressive metastatic disease. No residual enlarged thoracic lymph nodes. 4. Stable appearance of the visualized upper abdomen with probable cirrhosis and reactive adenopathy in the porta hepatis.      07/15/2017 Procedure    Therapeutic thoracentesis performed today; 1.3 L removed.  Fluid sent for cytology.       07/15/2017 Pathology Results    Pleural fluid NEGATIVE for malignancy by cytology.       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 Paramediastinal radiation change and loculated right pleural effusion appear unchanged from 07/14/2017. 2. Borderline prevascular lymph node within the anterior mediastinum is mildly increased in size from previous exam. 3. There is a new 5 mm lung nodule within the left lower lobe. Nonspecific in appearance. Attention on follow-up imaging advise. Other small nonspecific nodules in the left lung are stable. 4. Morphologic features of the liver compatible with cirrhosis. Enlarged upper abdominal lymph nodes are nonspecific and likely reactive. 5. Stable indeterminate low-attenuation focus in the posterior right liver dome. Previously characterized on MRI from 07/29/2017 as reflecting post treatment changes from external beam radiation to the lung. 6.  Emphysema (ICD10-J43.9).        CANCER STAGING: Cancer Staging Adenocarcinoma of right lung [facility]) Staging form: Lung, AJCC 7th Edition - Clinical stage from 07/23/2016:  Stage IIIA (T2b, N2, M0) - Signed by Baird Cancer, PA-C on 07/23/2016 - Clinical stage from 07/28/2017: Stage IV (M1a) - Signed by Holley Bouche, NP on 07/28/2017    INTERVAL HISTORY:  Mr. Bilyeu 57 y.o. male returns for routine follow-up and possible immunotherapy.  His last Tecentriq infusion was on 02/16/2018.  After that he was in the hospital for COPD exacerbation.  He was discharged on 03/08/2018.  He reports falling 2-3 times after discharge with the balance problem.  He has some pain in his right chest wall and left lower ribs when he takes deep breath.  Pain is described as dull.  He is tolerating Eliquis reasonably well.  He rarely has blood-tinged sputum when he coughs.  He complains of bilateral lower extremity swelling for the past 2 weeks.  Swelling is extending up to the knees.  He denies any nausea or vomiting or diarrhea.  He reports decreased appetite.  There is no change in his cough.  REVIEW OF SYSTEMS:  Review of Systems  Constitutional: Positive for appetite change and fatigue.  HENT:  Negative.   Eyes: Negative.   Respiratory: Positive for shortness of breath.   Cardiovascular: Positive for chest pain.  Gastrointestinal: Negative.   Endocrine: Negative.   Genitourinary: Negative.    Musculoskeletal: Negative.   Skin: Negative.   Neurological:  Positive for dizziness.  Hematological: Negative.   Psychiatric/Behavioral: Negative.      PAST MEDICAL/SURGICAL HISTORY:  Past Medical History:  Diagnosis Date  . Arnold-Chiari syndrome ([place])   . Arthritis   . Asthma   . Chronic back pain   . COPD (chronic obstructive pulmonary disease) ([place])   . Depression   . Dyspnea    with exertion   . GERD (gastroesophageal reflux disease)   . Hypertension   . Pneumonia   . Pulmonary embolus ([place] [facility])   . Seasonal allergies   . Spinal stenosis of lumbar region   . 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 Wheezing    Past Surgical History:  Procedure  Laterality Date  . BACK SURGERY     5 total  . BIOPSY  01/03/2017   Procedure: BIOPSY;  Surgeon: Daneil Dolin, MD;  [facility];  Service: Endoscopy;;  gastric  . COLONOSCOPY WITH PROPOFOL N/A 04/04/2017   Procedure: COLONOSCOPY WITH PROPOFOL;  Surgeon: Daneil Dolin, MD;  [facility];  Service: Endoscopy;  Laterality: N/A;  8:45am  . ESOPHAGOGASTRODUODENOSCOPY (EGD) WITH PROPOFOL N/A 01/03/2017   Procedure: ESOPHAGOGASTRODUODENOSCOPY (EGD) WITH PROPOFOL;  Surgeon: Daneil Dolin, MD;  [facility];  Service: Endoscopy;  Laterality: N/A;  . Venia Minks DILATION N/A 01/03/2017   Procedure: Venia Minks DILATION;  Surgeon: Daneil Dolin, MD;  [facility];  Service: Endoscopy;  Laterality: N/A;  . MULTIPLE EXTRACTIONS WITH ALVEOLOPLASTY N/A 07/08/2014   Procedure: MULTIPLE EXTRACION WITH ALVEOLOPLASTY with EXCISION LESION RIGHT SIDE OF TONGUE;  Surgeon: Gae Bon, DDS;  Location: [facility];  Service: Oral Surgery;  Laterality: N/A;  . PORTACATH PLACEMENT Right 07/30/2016   Procedure: INSERTION PORT-A-CATH;  Surgeon: Vickie Epley, MD;  Location: [facility];  Service: Vascular;  Laterality: Right;  [name] VIDEO BRONCHOSCOPY WITH ENDOBRONCHIAL ULTRASOUND N/A 07/19/2016   Procedure: VIDEO BRONCHOSCOPY WITH ENDOBRONCHIAL ULTRASOUND;  Surgeon: [name], MD;  Location: [facility];  Service: Thoracic;  Laterality: N/A;     SOCIAL HISTORY:  Social History   Socioeconomic History  . Marital status: Divorced    Spouse name: Not on file  . Number of children: Not on file  . Years of education: Not on file  . Highest education level: Not on file  Occupational History  . Not on file  Social Needs  . Financial resource strain: Not on file  . Food insecurity:    Worry: Not on file    Inability: Not on file  . Transportation needs:    Medical: Not on file    Non-medical: Not on file  Tobacco Use  . Smoking status: Current Every Day Smoker    Packs/day: 0.25     Years: 38.00    Pack years: 9.50    Types: Cigarettes  . Smokeless tobacco: Former Systems developer    Types: Chew  . Tobacco comment: using chantix- smoking once in a while  Substance and Sexual Activity  . Alcohol use: Yes    Alcohol/week: 1.2 oz    Types: 2 Cans of beer per week    Comment: reports drinking 0-2 beers a week  . Drug use: Yes    Frequency: 7.0 times per week    Types: Marijuana    Comment: daily for cancer; pt denies any use at present time 08/23/17  . Sexual activity: Not Currently  Lifestyle  . Physical activity:    Days per week: Not on  file    Minutes per session: Not on file  . Stress: Not on file  Relationships  . Social connections:    Talks on phone: Not on file    Gets together: Not on file    Attends religious service: Not on file    Active member of club or organization: Not on file    Attends meetings of clubs or organizations: Not on file    Relationship status: Not on file  . Intimate partner violence:    Fear of current or ex partner: Not on file    Emotionally abused: Not on file    Physically abused: Not on file    Forced sexual activity: Not on file  Other Topics Concern  . Not on file  Social History Narrative  . Not on file    FAMILY HISTORY:  Family History  Problem Relation Age of Onset  . Cancer Mother        breast cancer  . Heart failure Brother   . Colon cancer Neg Hx        not sure, ?grandfather and/or uncle    CURRENT MEDICATIONS:  Outpatient Encounter Medications as of 03/14/2018  Medication Sig  . albuterol (PROVENTIL) (2.5 MG/3ML) 0.083% nebulizer solution Take 3 mLs (2.5 mg total) by nebulization every 6 (six) hours as needed for wheezing or shortness of breath (if unable to experience relief by using combivent inhaler).  . ALPRAZolam (XANAX) 1 MG tablet Take 1 mg by mouth 2 (two) times daily as needed for anxiety or sleep (takes 1 tablet and bedtime for sleep and 1 dose during the day only if needed).   [name] amLODipine  (NORVASC) 5 MG tablet Take 5 mg by mouth daily.   [name] apixaban (ELIQUIS) 5 MG TABS tablet Take 1 tablet (5 mg total) by mouth 2 (two) times daily.  . Artificial Tear Solution (SOOTHE XP) SOLN Apply 2 drops to eye daily.  Huey Bienenstock (TECENTRIQ IV) Inject into the vein. Every 3 weeks  . escitalopram (LEXAPRO) 20 MG tablet Take 20 mg by mouth daily.   [name] gabapentin (NEURONTIN) 600 MG tablet Take 600 mg by mouth 2 (two) times daily.   [name] guaiFENesin (MUCINEX) 600 MG 12 hr tablet Take 600 mg by mouth 2 (two) times daily as needed (for congestion.).  [name] hydrochlorothiazide (MICROZIDE) 12.5 MG capsule Take 12.5 mg by mouth daily.   . Ipratropium-Albuterol (COMBIVENT) 20-100 MCG/ACT AERS respimat Inhale 1 puff into the lungs every 6 (six) hours as needed for wheezing or shortness of breath.  . methadone (DOLOPHINE) 10 MG tablet Take 20 mg by mouth 5 (five) times daily. Takes 2 tablets 5 times daily  . mirtazapine (REMERON) 15 MG tablet Take 1 tablet by mouth at bedtime.  . Multiple Vitamin (MULTIVITAMIN WITH MINERALS) TABS tablet Take 1 tablet by mouth daily. Centrum  . ondansetron (ZOFRAN) 8 MG tablet TAKE 1 TABLET BY MOUTH EVERY 8 HOURS AS NEEDED FOR NAUSEA AND VOMITING.  . pantoprazole (PROTONIX) 40 MG tablet TAKE (1) TABLET BY MOUTH TWICE DAILY.  [name] tiZANidine (ZANAFLEX) 4 MG tablet Take 4 mg by mouth every 8 (eight) hours as needed for muscle spasms.   . varenicline (CHANTIX) 0.5 MG tablet TAKE (2) TABLETS BY MOUTH TWICE DAILY.  [name] lactulose (CHRONULAC) 10 GM/15ML solution Take 15 mLs (10 g total) by mouth 3 (three) times daily as needed for mild constipation or moderate constipation. (Patient not taking: Reported on 03/14/2018)  . [DISCONTINUED] predniSONE (DELTASONE) 20 MG  tablet Take 3 tablets by mouth daily times 1 day; then 2 tablets by mouth daily times 2 days; then 1 tablet by mouth daily times 2 days; then half tablet by mouth daily times 3 days and stop prednisone.  . [DISCONTINUED] TRELEGY  ELLIPTA 100-62.5-25 MCG/INH AEPB Inhale 1 puff into the lungs 2 (two) times daily.   Facility-Administered Encounter Medications as of 03/14/2018  Medication  . 0.9 %  sodium chloride infusion    ALLERGIES:  Allergies  Allergen Reactions  . Demeclocycline Other (See Comments) and Swelling  . Tetracyclines & Related Swelling and Other (See Comments)          PHYSICAL EXAM:  ECOG Performance status: 1  Vitals:   03/14/18 1400  BP: (!) 131/91  Pulse: (!) 113  Resp: 16  Temp: 98.2 F (36.8 C)  SpO2: 94%   Filed Weights   03/14/18 1400  Weight: 219 lb 9.6 oz (99.6 kg)      LABORATORY DATA:  I have reviewed the labs as listed.  CBC    Component Value Date/Time   WBC 6.8 03/07/2018 0451   RBC 3.91 (L) 03/07/2018 0451   HGB 11.5 (L) 03/07/2018 0451   HCT 37.0 (L) 03/07/2018 0451   PLT 215 03/07/2018 0451   MCV 94.6 03/07/2018 0451   MCH 29.4 03/07/2018 0451   MCHC 31.1 03/07/2018 0451   RDW 15.7 (H) 03/07/2018 0451   LYMPHSABS 1.6 03/06/2018 1424   MONOABS 1.0 03/06/2018 1424   EOSABS 0.0 03/06/2018 1424   BASOSABS 0.0 03/06/2018 1424   CMP Latest Ref Rng & Units 03/07/2018 03/06/2018 02/16/2018  Glucose 65 - 99 mg/dL 260(H) 116(H) 136(H)  BUN 6 - 20 mg/dL 6 6 8   Creatinine 0.61 - 1.24 mg/dL 0.63 0.69 0.68  Sodium 135 - 145 mmol/L 138 135 137  Potassium 3.5 - 5.1 mmol/L 3.5 3.7 3.5  Chloride 101 - 111 mmol/L 98(L) 92(L) 99(L)  CO2 22 - 32 mmol/L 32 31 29  Calcium 8.9 - 10.3 mg/dL 8.3(L) 8.6(L) 8.6(L)  Total Protein 6.5 - 8.1 g/dL 6.6 6.7 6.9  Total Bilirubin 0.3 - 1.2 mg/dL 0.7 0.8 0.7  Alkaline Phos 38 - 126 U/L 179(H) 213(H) 170(H)  AST 15 - 41 U/L 30 40 37  ALT 17 - 63 U/L 23 26 28        DIAGNOSTIC IMAGING:  I have reviewed his CT scan from recent hospitalization in February 2019.  There is consolidation which is new.     ASSESSMENT & PLAN:   Adenocarcinoma of right lung ([facility]) 1.  Stage IV adenocarcinoma of the right lung: -Patient had recent  hospitalization from COPD exacerbation and discharged on 03/08/2018.  Reports that he is still feeling weak.  He fell about 3 times after discharge, reports "out of balance".  Denies any loss of consciousness.  Denies any headaches or vision changes.  Denies any nausea or vomiting.  He is currently on Tecentriq once every 3 weeks.  I would hold off on treatment today.  I would obtain MRI of the brain with and without contrast.  We will see him back after the MRI.  His last scans were done towards the end of January 2019 did not show any progression.  The findings were not suggestive of any pneumonitis.  We will consider repeating staging scans end of this month.  2.  Lower extremity swelling: He has lower extremity swelling up to the level of knees.  I will start him on Lasix  20 mg daily, slowly titrated up to 40 mg once a day as needed.  As his potassium is borderline at 3.5, we will also start him on potassium 20 mEq daily.  The fluid loculation could be from his underlying cirrhosis.  We will obtain a 2D echocardiogram to evaluate his ejection fraction.  3.  Pulmonary embolism: He is currently on Eliquis which she is tolerating reasonably well.  He has occasional blood-tinged sputum when he coughs.      Derek Jack, MD [place] [phone]

## 2018-03-14 NOTE — Addendum Note (Signed)
Addended by: Donnie Aho on: 03/14/2018 02:57 PM   Modules accepted: Orders

## 2018-03-14 NOTE — Addendum Note (Signed)
Addended by: Derek Jack on: 03/14/2018 02:51 PM   Modules accepted: Orders

## 2018-03-17 ENCOUNTER — Telehealth (HOSPITAL_COMMUNITY): Payer: Self-pay

## 2018-03-17 ENCOUNTER — Ambulatory Visit (HOSPITAL_COMMUNITY)
Admission: RE | Admit: 2018-03-17 | Discharge: 2018-03-17 | Disposition: A | Discharge status: Home / Self Care | Payer: Medicare Other | Source: Ambulatory Visit | Attending: Hematology | Admitting: Hematology

## 2018-03-17 DIAGNOSIS — I119 Hypertensive heart disease without heart failure: Secondary | ICD-10-CM | POA: Insufficient documentation

## 2018-03-17 DIAGNOSIS — Z85118 Personal history of other malignant neoplasm of bronchus and lung: Secondary | ICD-10-CM | POA: Insufficient documentation

## 2018-03-17 DIAGNOSIS — Z720 Tobacco use: Secondary | ICD-10-CM | POA: Diagnosis not present

## 2018-03-17 DIAGNOSIS — J449 Chronic obstructive pulmonary disease, unspecified: Secondary | ICD-10-CM | POA: Diagnosis not present

## 2018-03-17 DIAGNOSIS — C3491 Malignant neoplasm of unspecified part of right bronchus or lung: Secondary | ICD-10-CM

## 2018-03-17 NOTE — Telephone Encounter (Signed)
Jarrett Soho from echo lab called because patients heartrate was 125. He states he feels fine, that this happens sometimes. Reviewed with Dr. Delton Coombes to be sure patient could go home. Dr. Raliegh Ip states it was fine for patient to be discharged home. [place] notified.

## 2018-03-17 NOTE — Progress Notes (Signed)
*  PRELIMINARY RESULTS* Echocardiogram 2D Echocardiogram has been performed.  Erik [name] 03/17/2018, 3:07 PM

## 2018-03-20 ENCOUNTER — Ambulatory Visit (HOSPITAL_COMMUNITY)
Admission: RE | Admit: 2018-03-20 | Discharge: 2018-03-20 | Disposition: A | Discharge status: Home / Self Care | Payer: Medicare Other | Source: Ambulatory Visit | Attending: Hematology | Admitting: Hematology

## 2018-03-20 DIAGNOSIS — I739 Peripheral vascular disease, unspecified: Secondary | ICD-10-CM | POA: Insufficient documentation

## 2018-03-20 DIAGNOSIS — G319 Degenerative disease of nervous system, unspecified: Secondary | ICD-10-CM

## 2018-03-20 DIAGNOSIS — C3491 Malignant neoplasm of unspecified part of right bronchus or lung: Secondary | ICD-10-CM | POA: Insufficient documentation

## 2018-03-20 DIAGNOSIS — J869 Pyothorax without fistula: Secondary | ICD-10-CM | POA: Diagnosis not present

## 2018-03-20 DIAGNOSIS — A419 Sepsis, unspecified organism: Secondary | ICD-10-CM | POA: Diagnosis not present

## 2018-03-20 MED ORDER — GADOBENATE DIMEGLUMINE 529 MG/ML IV SOLN
20.0000 mL | Freq: Once | INTRAVENOUS | Status: AC | PRN
  Administered 2018-03-20: 20 mL via INTRAVENOUS

## 2018-03-21 ENCOUNTER — Other Ambulatory Visit (HOSPITAL_COMMUNITY): Payer: Medicare Other

## 2018-03-21 ENCOUNTER — Ambulatory Visit (HOSPITAL_COMMUNITY): Payer: Medicare Other

## 2018-03-21 ENCOUNTER — Ambulatory Visit (HOSPITAL_COMMUNITY): Payer: Medicare Other | Admitting: Hematology

## 2018-03-21 ENCOUNTER — Telehealth (HOSPITAL_COMMUNITY): Payer: Self-pay

## 2018-03-21 NOTE — Telephone Encounter (Signed)
Patient called and stated he was not going to be able to make appointment this morning. It is just to early in the morning to come and he is having "breathing issues". Tried to get patient to come in to appt this morning but he refused. Reviewed with RN. Rescheduled for next week April 24th, Instructed patient if his breathing gets worse to go to the ER. Patient verbalized understanding.

## 2018-03-23 ENCOUNTER — Emergency Department (HOSPITAL_COMMUNITY): Payer: Medicare Other

## 2018-03-23 ENCOUNTER — Other Ambulatory Visit: Payer: Self-pay

## 2018-03-23 ENCOUNTER — Inpatient Hospital Stay (HOSPITAL_COMMUNITY)
Admission: EM | Admit: 2018-03-23 | Discharge: 2018-04-08 | DRG: 853 | Disposition: A | Discharge status: Home / Self Care | Payer: Medicare Other | Attending: Family Medicine | Admitting: Family Medicine

## 2018-03-23 ENCOUNTER — Encounter (HOSPITAL_COMMUNITY): Payer: Self-pay | Admitting: Emergency Medicine

## 2018-03-23 DIAGNOSIS — Z419 Encounter for procedure for purposes other than remedying health state, unspecified: Secondary | ICD-10-CM

## 2018-03-23 DIAGNOSIS — Z9689 Presence of other specified functional implants: Secondary | ICD-10-CM

## 2018-03-23 DIAGNOSIS — C771 Secondary and unspecified malignant neoplasm of intrathoracic lymph nodes: Secondary | ICD-10-CM | POA: Diagnosis present

## 2018-03-23 DIAGNOSIS — G894 Chronic pain syndrome: Secondary | ICD-10-CM | POA: Diagnosis present

## 2018-03-23 DIAGNOSIS — E871 Hypo-osmolality and hyponatremia: Secondary | ICD-10-CM | POA: Diagnosis present

## 2018-03-23 DIAGNOSIS — F1123 Opioid dependence with withdrawal: Secondary | ICD-10-CM | POA: Diagnosis not present

## 2018-03-23 DIAGNOSIS — Z6828 Body mass index (BMI) 28.0-28.9, adult: Secondary | ICD-10-CM

## 2018-03-23 DIAGNOSIS — C3491 Malignant neoplasm of unspecified part of right bronchus or lung: Secondary | ICD-10-CM | POA: Diagnosis present

## 2018-03-23 DIAGNOSIS — Z923 Personal history of irradiation: Secondary | ICD-10-CM

## 2018-03-23 DIAGNOSIS — Z86711 Personal history of pulmonary embolism: Secondary | ICD-10-CM

## 2018-03-23 DIAGNOSIS — I10 Essential (primary) hypertension: Secondary | ICD-10-CM | POA: Diagnosis present

## 2018-03-23 DIAGNOSIS — J449 Chronic obstructive pulmonary disease, unspecified: Secondary | ICD-10-CM | POA: Diagnosis present

## 2018-03-23 DIAGNOSIS — R600 Localized edema: Secondary | ICD-10-CM | POA: Diagnosis present

## 2018-03-23 DIAGNOSIS — E876 Hypokalemia: Secondary | ICD-10-CM | POA: Diagnosis not present

## 2018-03-23 DIAGNOSIS — E869 Volume depletion, unspecified: Secondary | ICD-10-CM | POA: Diagnosis not present

## 2018-03-23 DIAGNOSIS — J189 Pneumonia, unspecified organism: Secondary | ICD-10-CM | POA: Diagnosis present

## 2018-03-23 DIAGNOSIS — F17210 Nicotine dependence, cigarettes, uncomplicated: Secondary | ICD-10-CM | POA: Diagnosis not present

## 2018-03-23 DIAGNOSIS — D62 Acute posthemorrhagic anemia: Secondary | ICD-10-CM | POA: Diagnosis not present

## 2018-03-23 DIAGNOSIS — C3431 Malignant neoplasm of lower lobe, right bronchus or lung: Secondary | ICD-10-CM | POA: Diagnosis present

## 2018-03-23 DIAGNOSIS — G893 Neoplasm related pain (acute) (chronic): Secondary | ICD-10-CM | POA: Diagnosis present

## 2018-03-23 DIAGNOSIS — J869 Pyothorax without fistula: Secondary | ICD-10-CM

## 2018-03-23 DIAGNOSIS — Y95 Nosocomial condition: Secondary | ICD-10-CM | POA: Diagnosis not present

## 2018-03-23 DIAGNOSIS — J9382 Other air leak: Secondary | ICD-10-CM | POA: Diagnosis not present

## 2018-03-23 DIAGNOSIS — A419 Sepsis, unspecified organism: Secondary | ICD-10-CM | POA: Diagnosis present

## 2018-03-23 DIAGNOSIS — C3490 Malignant neoplasm of unspecified part of unspecified bronchus or lung: Secondary | ICD-10-CM | POA: Diagnosis not present

## 2018-03-23 DIAGNOSIS — N179 Acute kidney failure, unspecified: Secondary | ICD-10-CM | POA: Diagnosis not present

## 2018-03-23 DIAGNOSIS — D638 Anemia in other chronic diseases classified elsewhere: Secondary | ICD-10-CM | POA: Diagnosis not present

## 2018-03-23 DIAGNOSIS — J9621 Acute and chronic respiratory failure with hypoxia: Secondary | ICD-10-CM | POA: Diagnosis present

## 2018-03-23 DIAGNOSIS — J440 Chronic obstructive pulmonary disease with acute lower respiratory infection: Secondary | ICD-10-CM | POA: Diagnosis present

## 2018-03-23 DIAGNOSIS — E872 Acidosis: Secondary | ICD-10-CM | POA: Diagnosis present

## 2018-03-23 DIAGNOSIS — E440 Moderate protein-calorie malnutrition: Secondary | ICD-10-CM | POA: Diagnosis present

## 2018-03-23 DIAGNOSIS — K529 Noninfective gastroenteritis and colitis, unspecified: Secondary | ICD-10-CM | POA: Diagnosis not present

## 2018-03-23 DIAGNOSIS — M545 Low back pain: Secondary | ICD-10-CM | POA: Diagnosis present

## 2018-03-23 DIAGNOSIS — Z803 Family history of malignant neoplasm of breast: Secondary | ICD-10-CM

## 2018-03-23 DIAGNOSIS — K219 Gastro-esophageal reflux disease without esophagitis: Secondary | ICD-10-CM | POA: Diagnosis not present

## 2018-03-23 DIAGNOSIS — F418 Other specified anxiety disorders: Secondary | ICD-10-CM | POA: Diagnosis present

## 2018-03-23 DIAGNOSIS — Z9981 Dependence on supplemental oxygen: Secondary | ICD-10-CM

## 2018-03-23 DIAGNOSIS — R5381 Other malaise: Secondary | ICD-10-CM | POA: Diagnosis present

## 2018-03-23 DIAGNOSIS — Z09 Encounter for follow-up examination after completed treatment for conditions other than malignant neoplasm: Secondary | ICD-10-CM

## 2018-03-23 DIAGNOSIS — Z8249 Family history of ischemic heart disease and other diseases of the circulatory system: Secondary | ICD-10-CM

## 2018-03-23 DIAGNOSIS — Z7901 Long term (current) use of anticoagulants: Secondary | ICD-10-CM

## 2018-03-23 DIAGNOSIS — Z9221 Personal history of antineoplastic chemotherapy: Secondary | ICD-10-CM

## 2018-03-23 DIAGNOSIS — I959 Hypotension, unspecified: Secondary | ICD-10-CM | POA: Diagnosis present

## 2018-03-23 DIAGNOSIS — J918 Pleural effusion in other conditions classified elsewhere: Secondary | ICD-10-CM | POA: Diagnosis present

## 2018-03-23 LAB — CBC WITH DIFFERENTIAL/PLATELET
Basophils Absolute: 0 10*3/uL (ref 0.0–0.1)
Basophils Relative: 0 %
EOS PCT: 0 %
Eosinophils Absolute: 0 10*3/uL (ref 0.0–0.7)
HCT: 41.4 % (ref 39.0–52.0)
Hemoglobin: 13.4 g/dL (ref 13.0–17.0)
LYMPHS ABS: 0.7 10*3/uL (ref 0.7–4.0)
Lymphocytes Relative: 4 %
MCH: 29.6 pg (ref 26.0–34.0)
MCHC: 32.4 g/dL (ref 30.0–36.0)
MCV: 91.4 fL (ref 78.0–100.0)
MONO ABS: 1 10*3/uL (ref 0.1–1.0)
Monocytes Relative: 7 %
Neutro Abs: 13.7 10*3/uL — ABNORMAL HIGH (ref 1.7–7.7)
Neutrophils Relative %: 89 %
PLATELETS: 294 10*3/uL (ref 150–400)
RBC: 4.53 MIL/uL (ref 4.22–5.81)
RDW: 15.9 % — AB (ref 11.5–15.5)
WBC: 15.4 10*3/uL — AB (ref 4.0–10.5)

## 2018-03-23 LAB — URINALYSIS, ROUTINE W REFLEX MICROSCOPIC
BACTERIA UA: NONE SEEN
Bilirubin Urine: NEGATIVE
GLUCOSE, UA: NEGATIVE mg/dL
KETONES UR: NEGATIVE mg/dL
LEUKOCYTES UA: NEGATIVE
Nitrite: NEGATIVE
PROTEIN: NEGATIVE mg/dL
Specific Gravity, Urine: 1.003 — ABNORMAL LOW (ref 1.005–1.030)
WBC UA: NONE SEEN WBC/hpf (ref 0–5)
pH: 6 (ref 5.0–8.0)

## 2018-03-23 LAB — COMPREHENSIVE METABOLIC PANEL
ALBUMIN: 2.2 g/dL — AB (ref 3.5–5.0)
ALT: 35 U/L (ref 17–63)
AST: 45 U/L — ABNORMAL HIGH (ref 15–41)
Alkaline Phosphatase: 220 U/L — ABNORMAL HIGH (ref 38–126)
Anion gap: 14 (ref 5–15)
BILIRUBIN TOTAL: 1.2 mg/dL (ref 0.3–1.2)
BUN: 5 mg/dL — AB (ref 6–20)
CHLORIDE: 92 mmol/L — AB (ref 101–111)
CO2: 28 mmol/L (ref 22–32)
CREATININE: 0.77 mg/dL (ref 0.61–1.24)
Calcium: 8.3 mg/dL — ABNORMAL LOW (ref 8.9–10.3)
GFR calc Af Amer: >60 mL/min (ref >60)
Glucose, Bld: 137 mg/dL — ABNORMAL HIGH (ref 65–99)
POTASSIUM: 3.7 mmol/L (ref 3.5–5.1)
Sodium: 134 mmol/L — ABNORMAL LOW (ref 135–145)
Total Protein: 7.8 g/dL (ref 6.5–8.1)

## 2018-03-23 LAB — PROTIME-INR
INR: 1.6
Prothrombin Time: 18.9 seconds — ABNORMAL HIGH (ref 11.4–15.2)

## 2018-03-23 LAB — I-STAT CG4 LACTIC ACID, ED
LACTIC ACID, VENOUS: 2.68 mmol/L — AB (ref 0.5–1.9)
LACTIC ACID, VENOUS: 1.24 mmol/L (ref 0.5–1.9)

## 2018-03-23 LAB — LACTIC ACID, PLASMA: LACTIC ACID, VENOUS: 0.8 mmol/L (ref 0.5–1.9)

## 2018-03-23 MED ORDER — SODIUM CHLORIDE 0.9% FLUSH
3.0000 mL | Freq: Two times a day (BID) | INTRAVENOUS | Status: DC
  Administered 2018-03-23 – 2018-03-27 (×6): 3 mL via INTRAVENOUS

## 2018-03-23 MED ORDER — VANCOMYCIN HCL IN DEXTROSE 1-5 GM/200ML-% IV SOLN
1000.0000 mg | Freq: Three times a day (TID) | INTRAVENOUS | Status: DC
  Administered 2018-03-24 – 2018-03-25 (×5): 1000 mg via INTRAVENOUS
  Filled 2018-03-23 (×6): qty 200

## 2018-03-23 MED ORDER — APIXABAN 5 MG PO TABS
5.0000 mg | ORAL_TABLET | Freq: Two times a day (BID) | ORAL | Status: DC
  Administered 2018-03-24 – 2018-03-25 (×4): 5 mg via ORAL
  Filled 2018-03-23 (×11): qty 1

## 2018-03-23 MED ORDER — LACTULOSE 10 GM/15ML PO SOLN
10.0000 g | Freq: Three times a day (TID) | ORAL | Status: DC | PRN
  Administered 2018-03-28: 10 g via ORAL
  Filled 2018-03-23: qty 15

## 2018-03-23 MED ORDER — VANCOMYCIN HCL IN DEXTROSE 1-5 GM/200ML-% IV SOLN
1000.0000 mg | Freq: Once | INTRAVENOUS | Status: AC
  Administered 2018-03-23: 1000 mg via INTRAVENOUS
  Filled 2018-03-23: qty 200

## 2018-03-23 MED ORDER — ONDANSETRON HCL 4 MG/2ML IJ SOLN
4.0000 mg | Freq: Four times a day (QID) | INTRAMUSCULAR | Status: DC | PRN
  Administered 2018-03-26: 4 mg via INTRAVENOUS
  Filled 2018-03-23: qty 2

## 2018-03-23 MED ORDER — SODIUM CHLORIDE 0.9 % IV SOLN
INTRAVENOUS | Status: AC
  Administered 2018-03-24: via INTRAVENOUS

## 2018-03-23 MED ORDER — VANCOMYCIN HCL IN DEXTROSE 1-5 GM/200ML-% IV SOLN: 1000.0000 mg | Freq: Once | INTRAVENOUS | Status: DC

## 2018-03-23 MED ORDER — PIPERACILLIN-TAZOBACTAM 3.375 G IVPB 30 MIN
3.3750 g | Freq: Once | INTRAVENOUS | Status: AC
  Administered 2018-03-23: 3.375 g via INTRAVENOUS
  Filled 2018-03-23: qty 50

## 2018-03-23 MED ORDER — ACETAMINOPHEN 650 MG RE SUPP: 650.0000 mg | Freq: Four times a day (QID) | RECTAL | Status: DC | PRN

## 2018-03-23 MED ORDER — IOPAMIDOL (ISOVUE-300) INJECTION 61%
75.0000 mL | Freq: Once | INTRAVENOUS | Status: AC | PRN
  Administered 2018-03-23: 22:00:00 via INTRAVENOUS

## 2018-03-23 MED ORDER — SODIUM CHLORIDE 0.9 % IV SOLN
1000.0000 mL | INTRAVENOUS | Status: DC
  Administered 2018-03-23: 1000 mL via INTRAVENOUS

## 2018-03-23 MED ORDER — GUAIFENESIN ER 600 MG PO TB12
600.0000 mg | ORAL_TABLET | Freq: Two times a day (BID) | ORAL | Status: DC | PRN
Start: 2018-03-23 — End: 2018-03-28
  Administered 2018-03-26: 600 mg via ORAL
  Filled 2018-03-23 (×2): qty 1

## 2018-03-23 MED ORDER — AMLODIPINE BESYLATE 5 MG PO TABS
5.0000 mg | ORAL_TABLET | Freq: Every day | ORAL | Status: DC
  Administered 2018-03-24 – 2018-03-27 (×4): 5 mg via ORAL
  Filled 2018-03-23 (×4): qty 1

## 2018-03-23 MED ORDER — PIPERACILLIN-TAZOBACTAM 3.375 G IVPB
3.3750 g | Freq: Three times a day (TID) | INTRAVENOUS | Status: DC
  Administered 2018-03-24 – 2018-04-04 (×33): 3.375 g via INTRAVENOUS
  Filled 2018-03-23 (×34): qty 50

## 2018-03-23 MED ORDER — ACETAMINOPHEN 325 MG PO TABS: 650.0000 mg | ORAL_TABLET | Freq: Four times a day (QID) | ORAL | Status: DC | PRN

## 2018-03-23 MED ORDER — METHADONE HCL 10 MG PO TABS
20.0000 mg | ORAL_TABLET | Freq: Every day | ORAL | Status: DC
  Administered 2018-03-24 – 2018-03-28 (×22): 20 mg via ORAL
  Filled 2018-03-23 (×22): qty 2

## 2018-03-23 MED ORDER — GABAPENTIN 300 MG PO CAPS
600.0000 mg | ORAL_CAPSULE | Freq: Two times a day (BID) | ORAL | Status: DC
  Administered 2018-03-24 – 2018-03-27 (×9): 600 mg via ORAL
  Filled 2018-03-23 (×11): qty 2

## 2018-03-23 MED ORDER — ADULT MULTIVITAMIN W/MINERALS CH
1.0000 | ORAL_TABLET | Freq: Every day | ORAL | Status: DC
  Administered 2018-03-24 – 2018-03-27 (×4): 1 via ORAL
  Filled 2018-03-23 (×4): qty 1

## 2018-03-23 MED ORDER — POLYVINYL ALCOHOL 1.4 % OP SOLN
2.0000 [drp] | Freq: Every day | OPHTHALMIC | Status: DC
  Administered 2018-03-24 – 2018-04-02 (×5): 2 [drp] via OPHTHALMIC
  Filled 2018-03-23 (×2): qty 15

## 2018-03-23 MED ORDER — ONDANSETRON HCL 4 MG PO TABS: 4.0000 mg | ORAL_TABLET | Freq: Four times a day (QID) | ORAL | Status: DC | PRN

## 2018-03-23 MED ORDER — PANTOPRAZOLE SODIUM 40 MG PO TBEC
40.0000 mg | DELAYED_RELEASE_TABLET | Freq: Two times a day (BID) | ORAL | Status: DC
  Administered 2018-03-24 – 2018-03-27 (×9): 40 mg via ORAL
  Filled 2018-03-23 (×9): qty 1
  Filled 2018-03-23: qty 2

## 2018-03-23 MED ORDER — TIZANIDINE HCL 4 MG PO TABS
4.0000 mg | ORAL_TABLET | Freq: Three times a day (TID) | ORAL | Status: DC | PRN
  Filled 2018-03-23: qty 1

## 2018-03-23 MED ORDER — MIRTAZAPINE 15 MG PO TABS
15.0000 mg | ORAL_TABLET | Freq: Every day | ORAL | Status: DC
  Administered 2018-03-24 – 2018-03-27 (×5): 15 mg via ORAL
  Filled 2018-03-23 (×5): qty 1

## 2018-03-23 MED ORDER — IPRATROPIUM-ALBUTEROL 0.5-2.5 (3) MG/3ML IN SOLN
3.0000 mL | Freq: Two times a day (BID) | RESPIRATORY_TRACT | Status: DC
  Administered 2018-03-24 – 2018-03-27 (×7): 3 mL via RESPIRATORY_TRACT
  Filled 2018-03-23 (×7): qty 3

## 2018-03-23 MED ORDER — SODIUM CHLORIDE 0.9 % IV BOLUS
1000.0000 mL | Freq: Once | INTRAVENOUS | Status: AC
  Administered 2018-03-23: 1000 mL via INTRAVENOUS

## 2018-03-23 MED ORDER — ESCITALOPRAM OXALATE 10 MG PO TABS
20.0000 mg | ORAL_TABLET | Freq: Every day | ORAL | Status: DC
  Administered 2018-03-24 – 2018-03-27 (×4): 20 mg via ORAL
  Filled 2018-03-23: qty 2
  Filled 2018-03-23: qty 1
  Filled 2018-03-23 (×2): qty 2
  Filled 2018-03-23 (×3): qty 1

## 2018-03-23 MED ORDER — ALBUTEROL SULFATE (2.5 MG/3ML) 0.083% IN NEBU: 2.5000 mg | INHALATION_SOLUTION | RESPIRATORY_TRACT | Status: DC | PRN

## 2018-03-23 MED ORDER — POTASSIUM CHLORIDE CRYS ER 20 MEQ PO TBCR
20.0000 meq | EXTENDED_RELEASE_TABLET | Freq: Every day | ORAL | Status: DC
  Administered 2018-03-24 – 2018-03-27 (×4): 20 meq via ORAL
  Filled 2018-03-23 (×4): qty 1

## 2018-03-23 MED ORDER — ACETAMINOPHEN 500 MG PO TABS
1000.0000 mg | ORAL_TABLET | Freq: Once | ORAL | Status: AC
  Administered 2018-03-23: 1000 mg via ORAL
  Filled 2018-03-23: qty 2

## 2018-03-23 MED ORDER — ALPRAZOLAM 0.5 MG PO TABS
1.0000 mg | ORAL_TABLET | Freq: Two times a day (BID) | ORAL | Status: DC | PRN
  Administered 2018-03-25 – 2018-03-27 (×3): 1 mg via ORAL
  Filled 2018-03-23 (×3): qty 2

## 2018-03-23 NOTE — ED Provider Notes (Addendum)
[facility] EMERGENCY DEPARTMENT Provider Note   CSN: [id number] Arrival date & time: 03/23/18  1850     History   Chief Complaint Chief Complaint  Patient presents with  . Shortness of Breath    HPI Erik [name] is a 57 y.o. male.  Patient with a history of lung cancer.  Followed by the hematology oncology clinic here.  Is currently undergoing chemotherapy but had disruptions with admissions for pneumonia and COPD.  Just resumed treatments.  Patient today around 3:00 in the afternoon started not to feel well increased shortness of breath.  Recently started on oxygen 2 L at a time.  Started with a cough this evening.  As mentioned patient has a history of lung CA and COPD.  Upon arrival here her heart rate was 126 consistent with a sinus tach on monitor.  Blood pressure was 111/79.  Oxygen saturation is was in the low 90s on 3 L.  Normally is on 2 L.  Patient was not aware that he had a fever but his temp here upon arrival was 102.5.  Tachycardic to 139 initially blood pressures have been fine no hypotension.  Blood pressure was 122/79.     Past Medical History:  Diagnosis Date  . 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 Arthritis   . Asthma   . Chronic back pain   . COPD (chronic obstructive pulmonary disease) ([place])   . Depression   . Dyspnea    with exertion   . GERD (gastroesophageal reflux disease)   . Hypertension   . Pneumonia   . Pulmonary embolus ([place])   . Seasonal allergies   . Spinal stenosis of lumbar region   . 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 Wheezing     Patient Active Problem List   Diagnosis Date Noted  . Bronchiectasis with acute exacerbation ([place])   . Acute respiratory failure with hypoxia ([place])   . Essential hypertension 03/07/2018  . Steroid-induced hyperglycemia 03/07/2018  . COPD exacerbation ([place]) 03/06/2018  . HCAP (healthcare-associated pneumonia) 01/28/2018  . Taking multiple medications for chronic disease 03/08/2017  .  Radiation-induced esophagitis 02/02/2017  . Loss of weight 12/10/2016  . Encounter for screening colonoscopy 12/10/2016  . Esophageal dysphagia 12/10/2016  . Abdominal pain, epigastric 12/10/2016  . 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 GERD 01/13/2009  . ACTINIC KERATOSIS 01/13/2009  . Anxiety state 12/27/2008  . DEPRESSION 12/27/2008  . ASTHMA 12/27/2008  . OSTEOARTHRITIS 12/27/2008  . LOW BACK PAIN 12/27/2008    Past Surgical History:  Procedure Laterality Date  . BACK SURGERY     5 total  . BIOPSY  01/03/2017   Procedure: BIOPSY;  Surgeon: Daneil Dolin, MD;  Location: [facility];  Service: Endoscopy;;  gastric  . COLONOSCOPY WITH PROPOFOL N/A 04/04/2017   Procedure: COLONOSCOPY WITH PROPOFOL;  Surgeon: Daneil Dolin, MD;  Location: [facility];  Service: Endoscopy;  Laterality: N/A;  8:45am  . ESOPHAGOGASTRODUODENOSCOPY (EGD) WITH PROPOFOL N/A 01/03/2017   Procedure: ESOPHAGOGASTRODUODENOSCOPY (EGD) WITH PROPOFOL;  Surgeon: Daneil Dolin, MD;  Location: [facility];  Service: Endoscopy;  Laterality: N/A;  . Venia Minks DILATION N/A 01/03/2017   Procedure: Venia Minks DILATION;  Surgeon: Daneil Dolin, MD;  [facility];  Service: Endoscopy;  Laterality: N/A;  . MULTIPLE EXTRACTIONS WITH ALVEOLOPLASTY N/A 07/08/2014   Procedure: MULTIPLE EXTRACION WITH ALVEOLOPLASTY with EXCISION LESION RIGHT SIDE OF TONGUE;  Surgeon: Gae Bon, DDS;  Location:  [facility];  Service: Oral Surgery;  Laterality: N/A;  . PORTACATH PLACEMENT Right 07/30/2016   Procedure: INSERTION PORT-A-CATH;  Surgeon: Vickie Epley, MD;  Location: [facility];  Service: Vascular;  Laterality: Right;  [name] VIDEO BRONCHOSCOPY WITH ENDOBRONCHIAL ULTRASOUND N/A 07/19/2016   Procedure: VIDEO BRONCHOSCOPY WITH ENDOBRONCHIAL ULTRASOUND;  Surgeon: [name], MD;  Location: [place];  Service: Thoracic;  Laterality: N/A;        Home Medications    Prior to Admission medications   Medication Sig  Start Date End Date Taking? Authorizing Provider  albuterol (PROVENTIL) (2.5 MG/3ML) 0.083% nebulizer solution Take 3 mLs (2.5 mg total) by nebulization every 6 (six) hours as needed for wheezing or shortness of breath (if unable to experience relief by using combivent inhaler). 03/08/18  Yes Barton Dubois, MD  ALPRAZolam Duanne Moron) 1 MG tablet Take 1 mg by mouth 2 (two) times daily as needed for anxiety or sleep (takes 1 tablet and bedtime for sleep and 1 dose during the day only if needed).  06/26/16  Yes [provider]  amLODipine (NORVASC) 5 MG tablet Take 5 mg by mouth daily.  07/07/16  Yes [provider]  apixaban (ELIQUIS) 5 MG TABS tablet Take 1 tablet (5 mg total) by mouth 2 (two) times daily. 01/23/18  Yes Jacquelin Hawking, NP  Artificial Tear Solution (SOOTHE XP) SOLN Apply 2 drops to eye daily.   Yes [provider]  Atezolizumab (TECENTRIQ IV) Inject into the vein. Every 3 weeks   Yes [provider]  escitalopram (LEXAPRO) 20 MG tablet Take 20 mg by mouth daily.  07/07/16  Yes [provider]  furosemide (LASIX) 20 MG tablet Take 1 tablet (20 mg total) by mouth daily. If you do not start urinating more by tomorrow then increase it to twice a day. 03/14/18  Yes Derek Jack, MD  gabapentin (NEURONTIN) 600 MG tablet Take 600 mg by mouth 2 (two) times daily.  08/19/17  Yes [provider]  guaiFENesin (MUCINEX) 600 MG 12 hr tablet Take 600 mg by mouth 2 (two) times daily as needed (for congestion.).   Yes [provider]  hydrochlorothiazide (MICROZIDE) 12.5 MG capsule Take 12.5 mg by mouth daily.  11/15/17  Yes [provider]  Ipratropium-Albuterol (COMBIVENT) 20-100 MCG/ACT AERS respimat Inhale 1 puff into the lungs every 6 (six) hours as needed for wheezing or shortness of breath. 03/08/18  Yes Barton Dubois, MD  lactulose (CHRONULAC) 10 GM/15ML solution Take 15 mLs (10 g total) by mouth 3 (three) times daily as needed  for mild constipation or moderate constipation. 01/23/18  Yes Jacquelin Hawking, NP  methadone (DOLOPHINE) 10 MG tablet Take 20 mg by mouth 5 (five) times daily. Takes 2 tablets 5 times daily 06/26/16  Yes [provider]  mirtazapine (REMERON) 15 MG tablet Take 1 tablet by mouth at bedtime. 08/17/17  Yes [provider]  Multiple Vitamin (MULTIVITAMIN WITH MINERALS) TABS tablet Take 1 tablet by mouth daily. Centrum   Yes [provider]  ondansetron (ZOFRAN) 8 MG tablet TAKE 1 TABLET BY MOUTH EVERY 8 HOURS AS NEEDED FOR NAUSEA AND VOMITING. 03/02/18  Yes Holley Bouche, NP  pantoprazole (PROTONIX) 40 MG tablet TAKE (1) TABLET BY MOUTH TWICE DAILY. 02/03/18  Yes Holley Bouche, NP  potassium chloride SA (K-DUR,KLOR-CON) 20 MEQ tablet Take 1 tablet (20 mEq total) by mouth daily. 03/14/18  Yes Derek Jack, MD  tiZANidine (ZANAFLEX) 4 MG tablet Take 4 mg  by mouth every 8 (eight) hours as needed for muscle spasms.  06/03/16  Yes [provider]  varenicline (CHANTIX) 0.5 MG tablet TAKE (2) TABLETS BY MOUTH TWICE DAILY. 02/15/18  Yes Holley Bouche, NP    Family History Family History  Problem Relation Age of Onset  . Cancer Mother        breast cancer  . Heart failure Brother   . Colon cancer Neg Hx        not sure, ?grandfather and/or uncle    Social History Social History   Tobacco Use  . Smoking status: Current Every Day Smoker    Packs/day: 0.25    Years: 38.00    Pack years: 9.50    Types: Cigarettes  . Smokeless tobacco: Former Systems developer    Types: Chew  . Tobacco comment: using chantix- smoking once in a while  Substance Use Topics  . Alcohol use: Yes    Alcohol/week: 1.2 oz    Types: 2 Cans of beer per week    Comment: reports drinking 0-2 beers a week  . Drug use: Yes    Frequency: 7.0 times per week    Types: Marijuana    Comment: daily for cancer; pt denies any use at present time 08/23/17     Allergies   Demeclocycline and  Tetracyclines & related   Review of Systems Review of Systems  Constitutional: Positive for fever. Negative for diaphoresis.  HENT: Negative for congestion.   Eyes: Negative for redness.  Respiratory: Positive for cough and shortness of breath.   Cardiovascular: Negative for chest pain.  Gastrointestinal: Negative for abdominal pain.  Genitourinary: Negative for dysuria.  Musculoskeletal: Positive for myalgias. Negative for back pain.  Skin: Negative for rash.  Neurological: Negative for headaches.  Hematological: Does not bruise/bleed easily.  Psychiatric/Behavioral: Negative for confusion.     Physical Exam Updated Vital Signs BP 111/71   Pulse (!) 109   Temp (!) 102.5 F (39.2 C) (Oral)   Resp 19   SpO2 94%   Physical Exam  Constitutional: He is oriented to person, place, and time. He appears well-developed and well-nourished. No distress.  HENT:  Head: Normocephalic and atraumatic.  Mouth/Throat: Oropharynx is clear and moist.  Eyes: Pupils are equal, round, and reactive to light. Conjunctivae and EOM are normal.  Neck: Neck supple.  Cardiovascular: Regular rhythm.  Tachycardic  Pulmonary/Chest: Effort normal. No respiratory distress. He has no wheezes. He has rales.  Port-A-Cath right upper chest area.  Skin around it looks healthy.  Abdominal: Soft. Bowel sounds are normal. There is no tenderness.  Musculoskeletal: Normal range of motion. He exhibits edema.  Neurological: He is alert and oriented to person, place, and time. No cranial nerve deficit. He exhibits normal muscle tone. Coordination normal.  Skin: Skin is warm.  Nursing note and vitals reviewed.    ED Treatments / Results  Labs (all labs ordered are listed, but only abnormal results are displayed) Labs Reviewed  COMPREHENSIVE METABOLIC PANEL - Abnormal; Notable for the following components:      Result Value   Sodium 134 (*)    Chloride 92 (*)    Glucose, Bld 137 (*)    BUN 5 (*)    Calcium  8.3 (*)    Albumin 2.2 (*)    AST 45 (*)    Alkaline Phosphatase 220 (*)    All other components within normal limits  PROTIME-INR - Abnormal; Notable for the following components:   Prothrombin Time  18.9 (*)    All other components within normal limits  CBC WITH DIFFERENTIAL/PLATELET - Abnormal; Notable for the following components:   WBC 15.4 (*)    RDW 15.9 (*)    Neutro Abs 13.7 (*)    All other components within normal limits  I-STAT CG4 LACTIC ACID, ED - Abnormal; Notable for the following components:   Lactic Acid, Venous 2.68 (*)    All other components within normal limits  CULTURE, BLOOD (ROUTINE X 2)  CULTURE, BLOOD (ROUTINE X 2)  URINALYSIS, ROUTINE W REFLEX MICROSCOPIC  I-STAT CG4 LACTIC ACID, ED  I-STAT CG4 LACTIC ACID, ED  I-STAT CG4 LACTIC ACID, ED    EKG EKG Interpretation  Date/Time:  Thursday March 23 2018 19:07:41 EDT Ventricular Rate:  139 PR Interval:    QRS Duration: 86 QT Interval:  356 QTC Calculation: 541 R Axis:   42 Text Interpretation:  Sinus tachycardia with short PR Possible Anterior infarct , age undetermined Abnormal ECG Confirmed by Fredia Sorrow [phone]) on 03/23/2018 9:18:17 PM   Radiology Dg Chest 2 View  Result Date: 03/23/2018 CLINICAL DATA:  RIGHT chest pain and shortness of breath. History of lung cancer. EXAM: CHEST - 2 VIEW COMPARISON:  03/06/2018 chest radiograph, 01/28/2018 CT and prior studies FINDINGS: RIGHT hemithorax volume loss again noted. Apparent air-fluid level overlying the LOWER RIGHT hemithorax is best identified on the LATERAL view and may represent a loculated pleural or pulmonary collection. There is apparent increase in airspace opacities within the visualized UPPER RIGHT lung. Mild LEFT basilar opacities noted. There is no evidence of pneumothorax. RIGHT Port-A-Cath is again noted with tip overlying the SUPERIOR cavoatrial junction. IMPRESSION: Apparent air-fluid level overlying the LOWER RIGHT hemithorax and may  represent a loculated pleural or pulmonary collection. Apparent increase and UPPER RIGHT lung airspace opacities and new mild LEFT basilar opacities noted. Chest CT with contrast is recommended for further evaluation. Electronically Signed   By: Margarette Canada M.D.   On: 03/23/2018 20:03   Ct Chest W Contrast  Result Date: 03/23/2018 CLINICAL DATA:  Acute onset of shortness of breath and right-sided chest pain. Dyspnea. EXAM: CT CHEST WITH CONTRAST TECHNIQUE: Multidetector CT imaging of the chest was performed during intravenous contrast administration. CONTRAST:  75 mL ISOVUE-300 IOPAMIDOL (ISOVUE-300) INJECTION 61% COMPARISON:  Chest radiograph performed earlier today at 7:38 p.m., and CTA of the chest performed 01/28/2018 FINDINGS: Cardiovascular: The heart is normal in size. A right-sided chest port is noted ending about the proximal right atrium. Minimal calcification is noted along the aortic arch. The great vessels are grossly unremarkable in appearance. Mediastinum/Nodes: Prominent mediastinal nodes are seen, measuring up to 1.8 cm in size at the subcarinal region. A mildly enlarged 1.2 cm node is also noted adjacent to the distal esophagus. Diffuse inflammation tracks about the esophagus and distal trachea, likely reflecting extension of the patient's severe lung infection. A small pericardial effusion is noted. The thyroid gland is unremarkable. No axillary lymphadenopathy is seen. Lungs/Pleura: There is a new large complex collection of air and fluid at the right pleural space, with underlying pleural thickening. This is concerning for new empyema secondary to the patient's necrotizing pneumonia. Given the amount of air within the collection, a bronchopleural fistula is a concern. There is underlying diffuse consolidation of much of the right lung, worsened from the prior study. Cavitary components are again noted. Additional pneumonia is noted at the left lung apex and left lung base, new from the prior  study. There appears  to be opacification of the bronchus to the right lower lobe; underlying aspiration cannot be excluded. No pneumothorax is seen. Upper Abdomen: The visualized portions of the liver are unremarkable. The spleen is enlarged, measuring 14.6 cm in length. The visualized portions of the pancreas and adrenal glands are unremarkable. Musculoskeletal: No acute osseous abnormalities are identified. The visualized musculature is unremarkable in appearance. IMPRESSION: 1. New large empyema noted at the right pleural space, with a complex collection of air and fluid and underlying pleural thickening. Given the amount of air within the collection, a bronchopleural fistula is a concern. 2. Worsening diffuse right-sided necrotizing pneumonia, and new left apical and left basilar pneumonia. 3. Mediastinal lymphadenopathy likely reflects the acute infection. Diffuse inflammation tracks about the esophagus and distal trachea, likely reflecting extension of the severe lung infection. 4. Opacification of the bronchus to the right lower lung lobe. Underlying aspiration cannot be excluded. 5. Small pericardial effusion. 6. Splenomegaly. Electronically Signed   By: Garald Balding M.D.   On: 03/23/2018 22:12    Procedures Procedures (including critical care time)  CRITICAL CARE Performed by: Fredia Sorrow Total critical care time: 30 minutes Critical care time was exclusive of separately billable procedures and treating other patients. Critical care was necessary to treat or prevent imminent or life-threatening deterioration. Critical care was time spent personally by me on the following activities: development of treatment plan with patient and/or surrogate as well as nursing, discussions with consultants, evaluation of patient's response to treatment, examination of patient, obtaining history from patient or surrogate, ordering and performing treatments and interventions, ordering and review of laboratory  studies, ordering and review of radiographic studies, pulse oximetry and re-evaluation of patient's condition.  Medications Ordered in ED Medications  0.9 %  sodium chloride infusion (1,000 mLs Intravenous New Bag/Given 03/23/18 2036)  vancomycin (VANCOCIN) IVPB 1000 mg/200 mL premix (1,000 mg Intravenous Not Given 03/23/18 2130)  piperacillin-tazobactam (ZOSYN) IVPB 3.375 g (has no administration in time range)  vancomycin (VANCOCIN) IVPB 1000 mg/200 mL premix (has no administration in time range)  acetaminophen (TYLENOL) tablet 1,000 mg (1,000 mg Oral Given 03/23/18 1920)  piperacillin-tazobactam (ZOSYN) IVPB 3.375 g (0 g Intravenous Stopped 03/23/18 2049)  vancomycin (VANCOCIN) IVPB 1000 mg/200 mL premix (0 mg Intravenous Stopped 03/23/18 2124)  sodium chloride 0.9 % bolus 1,000 mL (0 mLs Intravenous Stopped 03/23/18 2106)  iopamidol (ISOVUE-300) 61 % injection 75 mL ( Intravenous Contrast Given 03/23/18 2138)     Initial Impression / Assessment and Plan / ED Course  I have reviewed the triage vital signs and the nursing notes.  Pertinent labs & imaging results that were available during my care of the patient were reviewed by me and considered in my medical decision making (see chart for details).    Patient's vital signs on presentation with the tachycardia and fever increased respiratory rate consistent with sepsis protocol which was initiated.  Not hypotensive.  The patient did not receive the 30 cc/kg.  Was started on broad-spectrum antibiotics initial chest x-ray shows concerns for multiple opacities possibly pneumonia also some of this could be neoplastic process.  Patient was not aware that he was febrile.  Received Tylenol for the fever.  Will require admission has not been hypotensive.  Initial lactic acid was less than 3 but greater than 2.  Patient with recent admission beginning of April clearly any pneumonia would be hospital-acquired patient is currently undergoing chemotherapy for  long-standing lung cancer.  Labs show a leukocytosis of 15,000.  Electrolytes  not normal but no significant abnormalities.   CT chest with contrast ordered for delineate the chest x-ray findings in more detail as per radiologist request.  CT findings listed above.  Significant abnormalities including concerns for empyema most likely will require transfer to [facility].  Will probably have to go hospital service to hospital service cardiothoracic surgery may very well need to be involved and notified tonight as well.  Final Clinical Impressions(s) / ED Diagnoses   Final diagnoses:  Sepsis, due to unspecified organism ([place])  HCAP (healthcare-associated pneumonia)  Malignant neoplasm of lung, unspecified laterality, unspecified part of lung ([place])  Empyema lung [facility])    ED Discharge Orders    None       Fredia Sorrow, MD 03/23/18 2216    Fredia Sorrow, MD 03/23/18 2218    Fredia Sorrow, MD 03/23/18 2219    Fredia Sorrow, MD 03/23/18 [id number]    Fredia Sorrow, MD 03/23/18 2222

## 2018-03-23 NOTE — ED Triage Notes (Signed)
Pt C/O SOB that started around 1500 today. Pt states he is having some right sided pain. Pt has HX of COPD and lund cancer.

## 2018-03-23 NOTE — H&P (Signed)
History and Physical    Erik [name] KLK:[id number] DOB: [birth year] DOA: 03/23/2018  PCP: [name], MD   Patient coming from: Home  Chief Complaint: Increased SOB, right-sided pleuritic pain   HPI: Erik [name] is a 57 y.o. male with medical history significant for adenocarcinoma of the right lung, COPD with chronic hypoxic respiratory failure, hypertension, depression with anxiety, and chronic cancer-related pain, now presenting to the emergency department for evaluation of increased shortness of breath and right-sided pleuritic pain.  The patient reports that he had been doing fairly well since a recent admission for COPD exacerbation and until approximately 2 days ago when he noted the development of pleuritic pain at the right lateral chest wall.  He has also developed worsening shortness of breath since that time.  He has had a productive cough that is been persistent and not significantly changed.  He has not felt febrile.  Denies abdominal pain, headache, change in vision or hearing, or focal numbness or weakness.  Reports mild chronic bilateral lower extremity swelling that is unchanged, may be improved.  Denies lower leg tenderness.  Reports continued adherence with Eliquis.  ED Course: Upon arrival to the ED, patient is found to be febrile to 39.2 C, tachycardic in the 130s, saturating 85% on room air, and with stable blood pressure.  EKG features a sinus tachycardia with rate 139 and chest x-ray is concerning for apparent air-fluid levels overlying the right hemithorax and increased right upper airspace opacity, as well as new left lung base opacity.  This was followed by CT chest that demonstrates a new large empyema of the right pleural space with complex collection of air and fluid, worsening diffuse right sided necrotizing pneumonia.  Chemistry panel features a slight hyponatremia, mild alkaline phosphatase elevation, and slight elevation in AST.  CBC features a leukocytosis to  15,400 and lactic acid is elevated to 2.68.  Blood cultures were collected, 1 L of normal saline given, and the patient was treated with vancomycin and Zosyn.  Blood pressure remained stable, tachycardia has improved,  and the patient will be admitted to [facility] for ongoing evaluation and management of sepsis secondary to necrotizing pneumonia/empyema.  Review of Systems:  All other systems reviewed and apart from HPI, are negative.  Past Medical History:  Diagnosis Date  . Arnold-Chiari syndrome ([place])   . Arthritis   . Asthma   . Chronic back pain   . COPD (chronic obstructive pulmonary disease) ([place])   . Depression   . Dyspnea    with exertion   . GERD (gastroesophageal reflux disease)   . Hypertension   . Pneumonia   . Pulmonary embolus ([place])   . Seasonal allergies   . Spinal stenosis of lumbar region   . 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 Wheezing     Past Surgical History:  Procedure Laterality Date  . BACK SURGERY     5 total  . BIOPSY  01/03/2017   Procedure: BIOPSY;  Surgeon: Daneil Dolin, MD;  [facility];  Service: Endoscopy;;  gastric  . COLONOSCOPY WITH PROPOFOL N/A 04/04/2017   Procedure: COLONOSCOPY WITH PROPOFOL;  Surgeon: Daneil Dolin, MD;  [facility];  Service: Endoscopy;  Laterality: N/A;  8:45am  . ESOPHAGOGASTRODUODENOSCOPY (EGD) WITH PROPOFOL N/A 01/03/2017   Procedure: ESOPHAGOGASTRODUODENOSCOPY (EGD) WITH PROPOFOL;  Surgeon: Daneil Dolin, MD;  [facility];  Service: Endoscopy;  Laterality: N/A;  . MALONEY DILATION  N/A 01/03/2017   Procedure: Venia Minks DILATION;  Surgeon: Daneil Dolin, MD;  [facility];  Service: Endoscopy;  Laterality: N/A;  . MULTIPLE EXTRACTIONS WITH ALVEOLOPLASTY N/A 07/08/2014   Procedure: MULTIPLE EXTRACION WITH ALVEOLOPLASTY with EXCISION LESION RIGHT SIDE OF TONGUE;  Surgeon: Gae Bon, DDS;  Location: [place];  Service: Oral Surgery;  Laterality:  N/A;  . PORTACATH PLACEMENT Right 07/30/2016   Procedure: INSERTION PORT-A-CATH;  Surgeon: Vickie Epley, MD;  Location: [facility];  Service: Vascular;  Laterality: Right;  [name] VIDEO BRONCHOSCOPY WITH ENDOBRONCHIAL ULTRASOUND N/A 07/19/2016   Procedure: VIDEO BRONCHOSCOPY WITH ENDOBRONCHIAL ULTRASOUND;  Surgeon: [name], MD;  Location: [place];  Service: Thoracic;  Laterality: N/A;     reports that he has been smoking cigarettes.  He has a 9.50 pack-year smoking history. He has quit using smokeless tobacco. His smokeless tobacco use included chew. He reports that he drinks about 1.2 oz of alcohol per week. He reports that he has current or past drug history. Drug: Marijuana. Frequency: 7.00 times per week.  Allergies  Allergen Reactions  . Demeclocycline Other (See Comments) and Swelling  . Tetracyclines & Related Swelling and Other (See Comments)         Family History  Problem Relation Age of Onset  . Cancer Mother        breast cancer  . Heart failure Brother   . Colon cancer Neg Hx        not sure, ?grandfather and/or uncle     Prior to Admission medications   Medication Sig Start Date End Date Taking? Authorizing Provider  albuterol (PROVENTIL) (2.5 MG/3ML) 0.083% nebulizer solution Take 3 mLs (2.5 mg total) by nebulization every 6 (six) hours as needed for wheezing or shortness of breath (if unable to experience relief by using combivent inhaler). 03/08/18  Yes Barton Dubois, MD  ALPRAZolam Duanne Moron) 1 MG tablet Take 1 mg by mouth 2 (two) times daily as needed for anxiety or sleep (takes 1 tablet and bedtime for sleep and 1 dose during the day only if needed).  06/26/16  Yes [provider]  amLODipine (NORVASC) 5 MG tablet Take 5 mg by mouth daily.  07/07/16  Yes [provider]  apixaban (ELIQUIS) 5 MG TABS tablet Take 1 tablet (5 mg total) by mouth 2 (two) times daily. 01/23/18  Yes Jacquelin Hawking, NP  Artificial Tear Solution (SOOTHE XP) SOLN Apply 2  drops to eye daily.   Yes [provider]  Atezolizumab (TECENTRIQ IV) Inject into the vein. Every 3 weeks   Yes [provider]  escitalopram (LEXAPRO) 20 MG tablet Take 20 mg by mouth daily.  07/07/16  Yes [provider]  furosemide (LASIX) 20 MG tablet Take 1 tablet (20 mg total) by mouth daily. If you do not start urinating more by tomorrow then increase it to twice a day. 03/14/18  Yes Derek Jack, MD  gabapentin (NEURONTIN) 600 MG tablet Take 600 mg by mouth 2 (two) times daily.  08/19/17  Yes [provider]  guaiFENesin (MUCINEX) 600 MG 12 hr tablet Take 600 mg by mouth 2 (two) times daily as needed (for congestion.).   Yes [provider]  hydrochlorothiazide (MICROZIDE) 12.5 MG capsule Take 12.5 mg by mouth daily.  11/15/17  Yes [provider]  Ipratropium-Albuterol (COMBIVENT) 20-100 MCG/ACT AERS respimat Inhale 1 puff into the lungs every 6 (six) hours as needed for wheezing or shortness of breath. 03/08/18  Yes  Barton Dubois, MD  lactulose (CHRONULAC) 10 GM/15ML solution Take 15 mLs (10 g total) by mouth 3 (three) times daily as needed for mild constipation or moderate constipation. 01/23/18  Yes Jacquelin Hawking, NP  methadone (DOLOPHINE) 10 MG tablet Take 20 mg by mouth 5 (five) times daily. Takes 2 tablets 5 times daily 06/26/16  Yes [provider]  mirtazapine (REMERON) 15 MG tablet Take 1 tablet by mouth at bedtime. 08/17/17  Yes [provider]  Multiple Vitamin (MULTIVITAMIN WITH MINERALS) TABS tablet Take 1 tablet by mouth daily. Centrum   Yes [provider]  ondansetron (ZOFRAN) 8 MG tablet TAKE 1 TABLET BY MOUTH EVERY 8 HOURS AS NEEDED FOR NAUSEA AND VOMITING. 03/02/18  Yes Holley Bouche, NP  pantoprazole (PROTONIX) 40 MG tablet TAKE (1) TABLET BY MOUTH TWICE DAILY. 02/03/18  Yes Holley Bouche, NP  potassium chloride SA (K-DUR,KLOR-CON) 20 MEQ tablet Take 1 tablet (20 mEq total) by mouth  daily. 03/14/18  Yes Derek Jack, MD  tiZANidine (ZANAFLEX) 4 MG tablet Take 4 mg by mouth every 8 (eight) hours as needed for muscle spasms.  06/03/16  Yes [provider]  varenicline (CHANTIX) 0.5 MG tablet TAKE (2) TABLETS BY MOUTH TWICE DAILY. 02/15/18  Yes Holley Bouche, NP    Physical Exam: Vitals:   03/23/18 2115 03/23/18 2130 03/23/18 2200 03/23/18 2230  BP:  107/77 111/71 116/76  Pulse: (!) 116 (!) 111 (!) 109 (!) 107  Resp:  17 19 18   Temp:      TempSrc:      SpO2: 90% 90% 94% 92%      Constitutional: NAD, calm  Eyes: PERTLA, lids and conjunctivae normal ENMT: Mucous membranes are moist. Posterior pharynx clear of any exudate or lesions.   Neck: normal, supple, no masses, no thyromegaly Respiratory: Rhonchi bilaterally, diminished on right. Normal respiratory effort. No accessory muscle use.  Cardiovascular: Rate ~110 and regular. Trace pedal edema bilaterally. No significant JVD. Abdomen: No distension, no tenderness, soft. Bowel sounds normal.  Musculoskeletal: no clubbing / cyanosis. No joint deformity upper and lower extremities.   Skin: no significant rashes, lesions, ulcers. Warm, dry, well-perfused. Neurologic: CN 2-12 grossly intact. Sensation intact. Strength 5/5 in all 4 limbs.  Psychiatric:  Alert and oriented x 3. Calm, cooperative.     Labs on Admission: I have personally reviewed following labs and imaging studies  CBC: Recent Labs  Lab 03/23/18 2016  WBC 15.4*  NEUTROABS 13.7*  HGB 13.4  HCT 41.4  MCV 91.4  PLT 326   Basic Metabolic Panel: Recent Labs  Lab 03/23/18 1922  NA 134*  K 3.7  CL 92*  CO2 28  GLUCOSE 137*  BUN 5*  CREATININE 0.77  CALCIUM 8.3*   GFR: Estimated Creatinine Clearance: 127.5 mL/min (by C-G formula based on SCr of 0.77 mg/dL). Liver Function Tests: Recent Labs  Lab 03/23/18 1922  AST 45*  ALT 35  ALKPHOS 220*  BILITOT 1.2  PROT 7.8  ALBUMIN 2.2*   No results for input(s): LIPASE,  AMYLASE in the last 168 hours. No results for input(s): AMMONIA in the last 168 hours. Coagulation Profile: Recent Labs  Lab 03/23/18 1922  INR 1.60   Cardiac Enzymes: No results for input(s): CKTOTAL, CKMB, CKMBINDEX, TROPONINI in the last 168 hours. BNP (last 3 results) No results for input(s): PROBNP in the last 8760 hours. HbA1C: No results for input(s): HGBA1C in the last 72 hours. CBG: No results for input(s): GLUCAP  in the last 168 hours. Lipid Profile: No results for input(s): CHOL, HDL, LDLCALC, TRIG, CHOLHDL, LDLDIRECT in the last 72 hours. Thyroid Function Tests: No results for input(s): TSH, T4TOTAL, FREET4, T3FREE, THYROIDAB in the last 72 hours. Anemia Panel: No results for input(s): VITAMINB12, FOLATE, FERRITIN, TIBC, IRON, RETICCTPCT in the last 72 hours. Urine analysis: No results found for: COLORURINE, APPEARANCEUR, LABSPEC, [place], GLUCOSEU, HGBUR, BILIRUBINUR, [place], [place], [place], NITRITE, LEUKOCYTESUR Sepsis Labs: @LABRCNTIP (procalcitonin:4,lacticidven:4) ) Recent Results (from the past 240 hour(s))  Culture, blood (Routine x 2)     Status: None (Preliminary result)   Collection Time: 03/23/18  7:23 PM  Result Value Ref Range Status   Specimen Description RIGHT ANTECUBITAL  Final   Special Requests   Final    BOTTLES DRAWN AEROBIC AND ANAEROBIC Blood Culture adequate volume Performed at [facility], [address], [place], [place] [address]    Culture PENDING  Incomplete   Report Status PENDING  Incomplete  Culture, blood (Routine x 2)     Status: None (Preliminary result)   Collection Time: 03/23/18  7:23 PM  Result Value Ref Range Status   Specimen Description BLOOD RIGHT WRIST  Final   Special Requests   Final    BOTTLES DRAWN AEROBIC AND ANAEROBIC Blood Culture adequate volume Performed at [facility], [address] [place], [place] [address]    Culture PENDING  Incomplete   Report Status PENDING  Incomplete      Radiological Exams on Admission: Dg Chest 2 View  Result Date: 03/23/2018 CLINICAL DATA:  RIGHT chest pain and shortness of breath. History of lung cancer. EXAM: CHEST - 2 VIEW COMPARISON:  03/06/2018 chest radiograph, 01/28/2018 CT and prior studies FINDINGS: RIGHT hemithorax volume loss again noted. Apparent air-fluid level overlying the LOWER RIGHT hemithorax is best identified on the LATERAL view and may represent a loculated pleural or pulmonary collection. There is apparent increase in airspace opacities within the visualized UPPER RIGHT lung. Mild LEFT basilar opacities noted. There is no evidence of pneumothorax. RIGHT Port-A-Cath is again noted with tip overlying the SUPERIOR cavoatrial junction. IMPRESSION: Apparent air-fluid level overlying the LOWER RIGHT hemithorax and may represent a loculated pleural or pulmonary collection. Apparent increase and UPPER RIGHT lung airspace opacities and new mild LEFT basilar opacities noted. Chest CT with contrast is recommended for further evaluation. Electronically Signed   By: Margarette Canada M.D.   On: 03/23/2018 20:03   Ct Chest W Contrast  Result Date: 03/23/2018 CLINICAL DATA:  Acute onset of shortness of breath and right-sided chest pain. Dyspnea. EXAM: CT CHEST WITH CONTRAST TECHNIQUE: Multidetector CT imaging of the chest was performed during intravenous contrast administration. CONTRAST:  75 mL ISOVUE-300 IOPAMIDOL (ISOVUE-300) INJECTION 61% COMPARISON:  Chest radiograph performed earlier today at 7:38 p.m., and CTA of the chest performed 01/28/2018 FINDINGS: Cardiovascular: The heart is normal in size. A right-sided chest port is noted ending about the proximal right atrium. Minimal calcification is noted along the aortic arch. The great vessels are grossly unremarkable in appearance. Mediastinum/Nodes: Prominent mediastinal nodes are seen, measuring up to 1.8 cm in size at the subcarinal region. A mildly enlarged 1.2 cm node is also noted adjacent  to the distal esophagus. Diffuse inflammation tracks about the esophagus and distal trachea, likely reflecting extension of the patient's severe lung infection. A small pericardial effusion is noted. The thyroid gland is unremarkable. No axillary lymphadenopathy is seen. Lungs/Pleura: There is a new large complex collection of air and fluid at the right  pleural space, with underlying pleural thickening. This is concerning for new empyema secondary to the patient's necrotizing pneumonia. Given the amount of air within the collection, a bronchopleural fistula is a concern. There is underlying diffuse consolidation of much of the right lung, worsened from the prior study. Cavitary components are again noted. Additional pneumonia is noted at the left lung apex and left lung base, new from the prior study. There appears to be opacification of the bronchus to the right lower lobe; underlying aspiration cannot be excluded. No pneumothorax is seen. Upper Abdomen: The visualized portions of the liver are unremarkable. The spleen is enlarged, measuring 14.6 cm in length. The visualized portions of the pancreas and adrenal glands are unremarkable. Musculoskeletal: No acute osseous abnormalities are identified. The visualized musculature is unremarkable in appearance. IMPRESSION: 1. New large empyema noted at the right pleural space, with a complex collection of air and fluid and underlying pleural thickening. Given the amount of air within the collection, a bronchopleural fistula is a concern. 2. Worsening diffuse right-sided necrotizing pneumonia, and new left apical and left basilar pneumonia. 3. Mediastinal lymphadenopathy likely reflects the acute infection. Diffuse inflammation tracks about the esophagus and distal trachea, likely reflecting extension of the severe lung infection. 4. Opacification of the bronchus to the right lower lung lobe. Underlying aspiration cannot be excluded. 5. Small pericardial effusion. 6.  Splenomegaly. Electronically Signed   By: Garald Balding M.D.   On: 03/23/2018 22:12    EKG: Independently reviewed. Sinus tachycardia (rate 139).   Assessment/Plan  1. Sepsis d/t pneumonia; empyema  - Presents with increased SOB and right-sided pleuritic pain  - Found to have new large empyema at right pleural space with significant air concerning for possible bronchopleural fistula  - Blood cultures collected in ED and empiric vancomycin and Zosyn were started  - Check sputum culture and strep pneumo antigen, continue current abx, admit to [facility] for likely CTS consultation   2. Adenocarcinoma right lung  - Follows with oncology, treated with radiation and chemotherapy  - Currently receiving Tecentriq q3w  - Brain MRI earlier this week negative for met or any acute findings    3. COPD with chronic hypoxic respiratory failure  - Not wheezing on admission  - Continue Combivent, prn albuterol, supplemental O2    4. Hx of PE  - Continue Eliquis    5. Depression with anxiety  - Stable  - Continue Lexapro, Remeron, and prn Xanax    6. Hypertension  - BP at goal  - Continue Norvasc as tolerated, hold HCTZ while hydrating    DVT prophylaxis: Eliquis  Code Status: Full  Family Communication: Discussed with patient Consults called: none Admission status: Inpatient    Vianne Bulls, MD [facility] Pager [phone]  If 7PM-7AM, please contact night-coverage [email or web address] Password [facility]  03/23/2018, 10:53 PM

## 2018-03-23 NOTE — Progress Notes (Signed)
Pharmacy Antibiotic Note  Erik [name] is a 57 y.o. male admitted on 03/23/2018 with sepsis.  Pharmacy has been consulted for Vancomycin dosing.  Plan: Vancomcyin 2000 mg IV x 1 dose. Vancomycin 1000 mg IV every 8 hours.  Goal trough 15-20 mcg/mL. Zosyn 3.375g IV q8h (4 hour infusion).  Monitor labs, c/s, and vanco trough as indicated.      Temp (24hrs), Avg:102.5 F (39.2 C), Min:102.5 F (39.2 C), Max:102.5 F (39.2 C)  Recent Labs  Lab 03/23/18 1922 03/23/18 1944 03/23/18 2016  WBC  --   --  15.4*  CREATININE 0.77  --   --   LATICACIDVEN  --  2.68*  --     Estimated Creatinine Clearance: 127.5 mL/min (by C-G formula based on SCr of 0.77 mg/dL).    Allergies  Allergen Reactions  . Demeclocycline Other (See Comments) and Swelling  . Tetracyclines & Related Swelling and Other (See Comments)         Antimicrobials this admission: Vanco 4/18 >>  Zosyn 4/18 >>   Dose adjustments this admission: N/A  Microbiology results: 4/18 BCx: pending   Thank you for allowing pharmacy to be a part of this patient's care.  Ramond Craver 03/23/2018 9:55 PM

## 2018-03-24 LAB — CBC WITH DIFFERENTIAL/PLATELET
BASOS ABS: 0 10*3/uL (ref 0.0–0.1)
Basophils Relative: 0 %
EOS ABS: 0 10*3/uL (ref 0.0–0.7)
Eosinophils Relative: 0 %
HCT: 36.6 % — ABNORMAL LOW (ref 39.0–52.0)
HEMOGLOBIN: 11.7 g/dL — AB (ref 13.0–17.0)
LYMPHS ABS: 0.4 10*3/uL — AB (ref 0.7–4.0)
LYMPHS PCT: 6 %
MCH: 29.5 pg (ref 26.0–34.0)
MCHC: 32 g/dL (ref 30.0–36.0)
MCV: 92.2 fL (ref 78.0–100.0)
Monocytes Absolute: 0.7 10*3/uL (ref 0.1–1.0)
Monocytes Relative: 9 %
NEUTROS PCT: 85 %
Neutro Abs: 6.5 10*3/uL (ref 1.7–7.7)
Platelets: 233 10*3/uL (ref 150–400)
RBC: 3.97 MIL/uL — AB (ref 4.22–5.81)
RDW: 15.8 % — ABNORMAL HIGH (ref 11.5–15.5)
WBC: 7.6 10*3/uL (ref 4.0–10.5)

## 2018-03-24 LAB — BASIC METABOLIC PANEL
Anion gap: 10 (ref 5–15)
BUN: 5 mg/dL — ABNORMAL LOW (ref 6–20)
CHLORIDE: 97 mmol/L — AB (ref 101–111)
CO2: 29 mmol/L (ref 22–32)
Calcium: 7.9 mg/dL — ABNORMAL LOW (ref 8.9–10.3)
Creatinine, Ser: 0.58 mg/dL — ABNORMAL LOW (ref 0.61–1.24)
GFR calc non Af Amer: >60 mL/min (ref >60)
Glucose, Bld: 91 mg/dL (ref 65–99)
POTASSIUM: 3.5 mmol/L (ref 3.5–5.1)
SODIUM: 136 mmol/L (ref 135–145)

## 2018-03-24 LAB — LACTIC ACID, PLASMA: LACTIC ACID, VENOUS: 1 mmol/L (ref 0.5–1.9)

## 2018-03-24 LAB — MRSA PCR SCREENING: MRSA BY PCR: NEGATIVE

## 2018-03-24 LAB — STREP PNEUMONIAE URINARY ANTIGEN: Strep Pneumo Urinary Antigen: NEGATIVE

## 2018-03-24 MED ORDER — ALUM & MAG HYDROXIDE-SIMETH 200-200-20 MG/5ML PO SUSP
30.0000 mL | ORAL | Status: DC | PRN
  Administered 2018-03-24: 30 mL via ORAL
  Filled 2018-03-24: qty 30

## 2018-03-24 NOTE — ED Notes (Signed)
Pt aware of bed assignment and that transportation may be delayed due to weather. Pt verbalized understanding.

## 2018-03-24 NOTE — ED Notes (Signed)
[facility] at bedside 

## 2018-03-24 NOTE — ED Notes (Signed)
Pt has been receiving home medications and PO fluids per report when this RN assumed care at 1400. Current order states for pt to be NPO. Consulted Dr. Jerilee Hoh and reported pt can eat and to place heart healthy diet order.

## 2018-03-24 NOTE — Progress Notes (Signed)
PROGRESS NOTE    Erik [name]  ZOX:[id number] DOB: [birth year] DOA: 03/23/2018 PCP: [name], MD     Brief Narrative:  Patient admitted from home on 4/17 due to shortness of breath and right-sided pleuritic chest pain.  He has a history significant for right lung adenocarcinoma, COPD and cancer related pain.  He has been having a productive cough, denies fever.  In the ED he was noted to be febrile to 39.2 C, tachycardic and saturating 85% on room air.  Chest CT showed a new large empyema of the right pleural space with complex collection of air and fluid and worsening diffuse right sided necrotizing pneumonia.  EDP discussed case with CVT S who recommended transfer to [facility] for further evaluation.  Currently awaiting transfer at the [facility] emergency department.   Assessment & Plan:   Principal Problem:   Empyema of lung ([place]) Active Problems:   Depression with anxiety   Adenocarcinoma of right lung (HCC)   COPD (chronic obstructive pulmonary disease) ([place])   Essential hypertension   Acute on chronic respiratory failure with hypoxia (HCC)   Sepsis due to pneumonia (HCC)   History of pulmonary embolism   Empyema lung (HCC)   Right lung empyema -There is concern for a possible bronchopleural fistula given large amount of air in the right pleural space. -Agree with continued broad-spectrum antibiotic therapy with vancomycin and Zosyn for now.  Will check MRSA PCR and consider discontinuation of vancomycin if negative. -Culture data has been requested and is pending. -Please notify CVT S upon arrival to count.  Adenocarcinoma of the right lung -Follows with oncology, treated with radiation chemotherapy. -Currently receiving Tecentriq every 3 weeks.  Acute hypoxemic respiratory failure -Due to lung empyema/necrotizing pneumonia. -Continue to provide supplemental oxygen as needed to keep sats above 90%.  History of PE -Continue Eliquis  Depression with  anxiety -Mood is stable, continue Lexapro, Remeron, as needed Xanax.  Hypertension -Blood pressure is at goal. -Continue Norvasc, hold hydrochlorothiazide.  Chronic pain syndrome -Continue methadone. -If ends up needing surgery may need to consider transient escalation of narcotics.   DVT prophylaxis: Eliquis Code Status: Full code Family Communication: Patient only Disposition Plan: Transfer to [facility] once bed available  Consultants:   CV TS pending  Procedures:   None    Antimicrobials:  Anti-infectives (From admission, onward)   Start     Dose/Rate Route Frequency Ordered Stop   03/24/18 0600  vancomycin (VANCOCIN) IVPB 1000 mg/200 mL premix     1,000 mg 200 mL/hr over 60 Minutes Intravenous Every 8 hours 03/23/18 2155     03/24/18 0400  piperacillin-tazobactam (ZOSYN) IVPB 3.375 g     3.375 g 12.5 mL/hr over 240 Minutes Intravenous Every 8 hours 03/23/18 2152     03/23/18 2130  vancomycin (VANCOCIN) IVPB 1000 mg/200 mL premix     1,000 mg 200 mL/hr over 60 Minutes Intravenous  Once 03/23/18 2058     03/23/18 2015  piperacillin-tazobactam (ZOSYN) IVPB 3.375 g     3.375 g 100 mL/hr over 30 Minutes Intravenous  Once 03/23/18 2010 03/23/18 2049   03/23/18 2015  vancomycin (VANCOCIN) IVPB 1000 mg/200 mL premix     1,000 mg 200 mL/hr over 60 Minutes Intravenous  Once 03/23/18 2010 03/23/18 2124       Subjective: In stretcher in the ED, with increased respirations.  Complains of mild to moderate shortness of breath and some right-sided chest pain with deep inspiration.  Objective: Vitals:  03/24/18 1030 03/24/18 1045 03/24/18 1100 03/24/18 1115  BP: 102/69  111/74   Pulse: (!) 116 (!) 115 (!) 120 (!) 115  Resp: (!) 21 (!) 21 18 (!) 21  Temp:      TempSrc:      SpO2: 97% 98% (!) 89% (!) 87%    Intake/Output Summary (Last 24 hours) at 03/24/2018 1412 Last data filed at 03/24/2018 0828 Gross per 24 hour  Intake 1915.83 ml  Output 600 ml  Net 1315.83 ml    There were no vitals filed for this visit.  Examination:  General exam: Alert, awake, oriented x 3 Respiratory system: Markedly decreased breath sounds to right base Cardiovascular system: Tachycardic, regular, no murmurs on auscultation Gastrointestinal system: Abdomen is nondistended, soft and nontender. No organomegaly or masses felt. Normal bowel sounds heard. Central nervous system: Alert and oriented. No focal neurological deficits. Extremities: No C/C/E, +pedal pulses Skin: No rashes, lesions or ulcers Psychiatry: Judgement and insight appear normal. Mood & affect appropriate.     Data Reviewed: I have personally reviewed following labs and imaging studies  CBC: Recent Labs  Lab 03/23/18 2016 03/24/18 0528  WBC 15.4* 7.6  NEUTROABS 13.7* 6.5  HGB 13.4 11.7*  HCT 41.4 36.6*  MCV 91.4 92.2  PLT 294 235   Basic Metabolic Panel: Recent Labs  Lab 03/23/18 1922 03/24/18 0528  NA 134* 136  K 3.7 3.5  CL 92* 97*  CO2 28 29  GLUCOSE 137* 91  BUN 5* 5*  CREATININE 0.77 0.58*  CALCIUM 8.3* 7.9*   GFR: Estimated Creatinine Clearance: 127.5 mL/min (A) (by C-G formula based on SCr of 0.58 mg/dL (L)). Liver Function Tests: Recent Labs  Lab 03/23/18 1922  AST 45*  ALT 35  ALKPHOS 220*  BILITOT 1.2  PROT 7.8  ALBUMIN 2.2*   No results for input(s): LIPASE, AMYLASE in the last 168 hours. No results for input(s): AMMONIA in the last 168 hours. Coagulation Profile: Recent Labs  Lab 03/23/18 1922  INR 1.60   Cardiac Enzymes: No results for input(s): CKTOTAL, CKMB, CKMBINDEX, TROPONINI in the last 168 hours. BNP (last 3 results) No results for input(s): PROBNP in the last 8760 hours. HbA1C: No results for input(s): HGBA1C in the last 72 hours. CBG: No results for input(s): GLUCAP in the last 168 hours. Lipid Profile: No results for input(s): CHOL, HDL, LDLCALC, TRIG, CHOLHDL, LDLDIRECT in the last 72 hours. Thyroid Function Tests: No results for  input(s): TSH, T4TOTAL, FREET4, T3FREE, THYROIDAB in the last 72 hours. Anemia Panel: No results for input(s): VITAMINB12, FOLATE, FERRITIN, TIBC, IRON, RETICCTPCT in the last 72 hours. Urine analysis:    Component Value Date/Time   COLORURINE STRAW (A) 03/23/2018 1905   APPEARANCEUR CLEAR 03/23/2018 1905   LABSPEC 1.003 (L) 03/23/2018 1905   PHURINE 6.0 03/23/2018 1905   GLUCOSEU NEGATIVE 03/23/2018 1905   HGBUR SMALL (A) 03/23/2018 1905   BILIRUBINUR NEGATIVE 03/23/2018 1905   KETONESUR NEGATIVE 03/23/2018 1905   PROTEINUR NEGATIVE 03/23/2018 1905   NITRITE NEGATIVE 03/23/2018 1905   LEUKOCYTESUR NEGATIVE 03/23/2018 1905   Sepsis Labs: @LABRCNTIP (procalcitonin:4,lacticidven:4)  ) Recent Results (from the past 240 hour(s))  Culture, blood (Routine x 2)     Status: None (Preliminary result)   Collection Time: 03/23/18  7:23 PM  Result Value Ref Range Status   Specimen Description RIGHT ANTECUBITAL  Final   Special Requests   Final    BOTTLES DRAWN AEROBIC AND ANAEROBIC Blood Culture adequate volume   Culture  Final    NO GROWTH < 12 HOURS Performed at [facility], [address], [place], [place] [address]    Report Status PENDING  Incomplete  Culture, blood (Routine x 2)     Status: None (Preliminary result)   Collection Time: 03/23/18  7:23 PM  Result Value Ref Range Status   Specimen Description BLOOD RIGHT WRIST  Final   Special Requests   Final    BOTTLES DRAWN AEROBIC AND ANAEROBIC Blood Culture adequate volume   Culture   Final    NO GROWTH < 12 HOURS Performed at [facility], [address] [place], [place] [address]    Report Status PENDING  Incomplete         Radiology Studies: Dg Chest 2 View  Result Date: 03/23/2018 CLINICAL DATA:  RIGHT chest pain and shortness of breath. History of lung cancer. EXAM: CHEST - 2 VIEW COMPARISON:  03/06/2018 chest radiograph, 01/28/2018 CT and prior studies FINDINGS: RIGHT hemithorax volume loss again noted. Apparent  air-fluid level overlying the LOWER RIGHT hemithorax is best identified on the LATERAL view and may represent a loculated pleural or pulmonary collection. There is apparent increase in airspace opacities within the visualized UPPER RIGHT lung. Mild LEFT basilar opacities noted. There is no evidence of pneumothorax. RIGHT Port-A-Cath is again noted with tip overlying the SUPERIOR cavoatrial junction. IMPRESSION: Apparent air-fluid level overlying the LOWER RIGHT hemithorax and may represent a loculated pleural or pulmonary collection. Apparent increase and UPPER RIGHT lung airspace opacities and new mild LEFT basilar opacities noted. Chest CT with contrast is recommended for further evaluation. Electronically Signed   By: Margarette Canada M.D.   On: 03/23/2018 20:03   Ct Chest W Contrast  Result Date: 03/23/2018 CLINICAL DATA:  Acute onset of shortness of breath and right-sided chest pain. Dyspnea. EXAM: CT CHEST WITH CONTRAST TECHNIQUE: Multidetector CT imaging of the chest was performed during intravenous contrast administration. CONTRAST:  75 mL ISOVUE-300 IOPAMIDOL (ISOVUE-300) INJECTION 61% COMPARISON:  Chest radiograph performed earlier today at 7:38 p.m., and CTA of the chest performed 01/28/2018 FINDINGS: Cardiovascular: The heart is normal in size. A right-sided chest port is noted ending about the proximal right atrium. Minimal calcification is noted along the aortic arch. The great vessels are grossly unremarkable in appearance. Mediastinum/Nodes: Prominent mediastinal nodes are seen, measuring up to 1.8 cm in size at the subcarinal region. A mildly enlarged 1.2 cm node is also noted adjacent to the distal esophagus. Diffuse inflammation tracks about the esophagus and distal trachea, likely reflecting extension of the patient's severe lung infection. A small pericardial effusion is noted. The thyroid gland is unremarkable. No axillary lymphadenopathy is seen. Lungs/Pleura: There is a new large complex  collection of air and fluid at the right pleural space, with underlying pleural thickening. This is concerning for new empyema secondary to the patient's necrotizing pneumonia. Given the amount of air within the collection, a bronchopleural fistula is a concern. There is underlying diffuse consolidation of much of the right lung, worsened from the prior study. Cavitary components are again noted. Additional pneumonia is noted at the left lung apex and left lung base, new from the prior study. There appears to be opacification of the bronchus to the right lower lobe; underlying aspiration cannot be excluded. No pneumothorax is seen. Upper Abdomen: The visualized portions of the liver are unremarkable. The spleen is enlarged, measuring 14.6 cm in length. The visualized portions of the pancreas and adrenal glands are unremarkable. Musculoskeletal: No acute osseous  abnormalities are identified. The visualized musculature is unremarkable in appearance. IMPRESSION: 1. New large empyema noted at the right pleural space, with a complex collection of air and fluid and underlying pleural thickening. Given the amount of air within the collection, a bronchopleural fistula is a concern. 2. Worsening diffuse right-sided necrotizing pneumonia, and new left apical and left basilar pneumonia. 3. Mediastinal lymphadenopathy likely reflects the acute infection. Diffuse inflammation tracks about the esophagus and distal trachea, likely reflecting extension of the severe lung infection. 4. Opacification of the bronchus to the right lower lung lobe. Underlying aspiration cannot be excluded. 5. Small pericardial effusion. 6. Splenomegaly. Electronically Signed   By: Garald Balding M.D.   On: 03/23/2018 22:12        Scheduled Meds: . amLODipine  5 mg Oral Daily  . apixaban  5 mg Oral BID  . escitalopram  20 mg Oral Daily  . gabapentin  600 mg Oral BID  . ipratropium-albuterol  3 mL Inhalation BID  . methadone  20 mg Oral 5 X  Daily  . mirtazapine  15 mg Oral QHS  . multivitamin with minerals  1 tablet Oral Daily  . pantoprazole  40 mg Oral BID  . polyvinyl alcohol  2 drop Both Eyes Daily  . potassium chloride SA  20 mEq Oral Daily  . sodium chloride flush  3 mL Intravenous Q12H   Continuous Infusions: . piperacillin-tazobactam (ZOSYN)  IV Stopped (03/24/18 0723)  . vancomycin    . vancomycin Stopped (03/24/18 0828)     LOS: 1 day    Time spent: 25 minutes.     Lelon Frohlich, MD [facility] Pager [phone]  If 7PM-7AM, please contact night-coverage [email or web address] Password [email or web address] 03/24/2018, 2:12 PM

## 2018-03-24 NOTE — ED Notes (Addendum)
Urinary antigen specimen collected via urine sample per Lab. This sample and MRSA PCR clicked off at same time. Source incorrectly documented as nasal mucosa in Epic. Lab aware.

## 2018-03-24 NOTE — ED Notes (Addendum)
Pt left with [facility]. Called receiving unit to notify pt en route to [facility].

## 2018-03-24 NOTE — ED Notes (Signed)
Pt black t-shirt and brown moccasins placed in patient belongings bag and placed on pt bed awaiting transport to [facility].

## 2018-03-24 NOTE — ED Notes (Signed)
[facility] reported would be to pick up pt in approximately 20 minutes. Pt aware.

## 2018-03-25 DIAGNOSIS — J869 Pyothorax without fistula: Secondary | ICD-10-CM

## 2018-03-25 DIAGNOSIS — C3490 Malignant neoplasm of unspecified part of unspecified bronchus or lung: Secondary | ICD-10-CM

## 2018-03-25 LAB — VANCOMYCIN, TROUGH: Vancomycin Tr: 22 ug/mL (ref 15–20)

## 2018-03-25 MED ORDER — VANCOMYCIN HCL IN DEXTROSE 750-5 MG/150ML-% IV SOLN
750.0000 mg | Freq: Three times a day (TID) | INTRAVENOUS | Status: DC
  Administered 2018-03-26 – 2018-03-27 (×4): 750 mg via INTRAVENOUS
  Filled 2018-03-25 (×5): qty 150

## 2018-03-25 MED ORDER — HEPARIN BOLUS VIA INFUSION
4000.0000 [IU] | Freq: Once | INTRAVENOUS | Status: AC
  Administered 2018-03-25: 4000 [IU] via INTRAVENOUS
  Filled 2018-03-25: qty 4000

## 2018-03-25 MED ORDER — BOOST PLUS PO LIQD
237.0000 mL | Freq: Two times a day (BID) | ORAL | Status: DC
  Administered 2018-03-25: 237 mL via ORAL
  Filled 2018-03-25 (×8): qty 237

## 2018-03-25 MED ORDER — BACITRACIN-NEOMYCIN-POLYMYXIN OINTMENT TUBE
TOPICAL_OINTMENT | Freq: Two times a day (BID) | CUTANEOUS | Status: DC
  Administered 2018-03-25 – 2018-03-27 (×6): via TOPICAL
  Filled 2018-03-25: qty 14.17

## 2018-03-25 MED ORDER — HEPARIN (PORCINE) IN NACL 100-0.45 UNIT/ML-% IJ SOLN
1600.0000 [IU]/h | INTRAMUSCULAR | Status: DC
  Administered 2018-03-25: 1400 [IU]/h via INTRAVENOUS
  Administered 2018-03-26: 1500 [IU]/h via INTRAVENOUS
  Administered 2018-03-27 (×2): 1600 [IU]/h via INTRAVENOUS
  Filled 2018-03-25 (×4): qty 250

## 2018-03-25 NOTE — Progress Notes (Signed)
CRITICAL VALUE ALERT  Critical Value:  Vanc trough  Date & Time Notified:  03/25/18 1418  Provider Notified: n/a;pharmacist adjusted  Orders Received/Actions taken: Vancomycin dosage decreased

## 2018-03-25 NOTE — Progress Notes (Deleted)
Pharmacy Antibiotic Note  Erik [name] is a 57 y.o. male admitted on 03/23/2018 with sepsis.  Pharmacy has been consulted for Vancomycin dosing.  VT today 22 (drawn appropriately). CrCl~110 ml/min. Pt afebrile, WBC wnl.   Plan: Decrease vancomcyin gtt to 750 mg IV Q8h  Monitor labs, c/s, and vanco trough as indicated   Height: 6' (182.9 cm) Weight: 207 lb 14.3 oz (94.3 kg) IBW/kg (Calculated) : 77.6  Temp (24hrs), Avg:99.5 F (37.5 C), Min:98.2 F (36.8 C), Max:100.6 F (38.1 C)  Recent Labs  Lab 03/23/18 1922 03/23/18 1944 03/23/18 2016 03/23/18 2250 03/23/18 2317 03/24/18 0204 03/24/18 0528 03/25/18 1319  WBC  --   --  15.4*  --   --   --  7.6  --   CREATININE 0.77  --   --   --   --   --  0.58*  --   LATICACIDVEN  --  2.68*  --  1.24 0.8 1.0  --   --   VANCOTROUGH  --   --   --   --   --   --   --  22*    Estimated Creatinine Clearance: 124.4 mL/min (A) (by C-G formula based on SCr of 0.58 mg/dL (L)).    Allergies  Allergen Reactions  . Demeclocycline Other (See Comments) and Swelling  . Tetracyclines & Related Swelling and Other (See Comments)         Antimicrobials this admission: Vanco 4/18 >>  Zosyn 4/18 >>   Dose adjustments this admission: 4/20 Vancomycin 1000 mg Q8h>>Vancomycin 750 mg Q8h 4/20 VT 22  Microbiology results: 4/18 BCx: pending  Leroy Libman, PharmD Pharmacy Resident Pager: [phone]

## 2018-03-25 NOTE — H&P (View-Only) (Signed)
[address]       [place] [address]             [phone]        Erik [name] [facility] Medical Record #[id number] Date of Birth: [birth year]  Referring: Mem[place] Primary Care: [name], MD Primary Cardiologist:No primary care provider on file.  Chief Complaint:    Chief Complaint  Patient presents with  . 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  He presented to the ED on 03/23/2018 with complaints of increased shortness of breath and right sided pleuritic chest pain.  The patient had been in his normal state of health until a recent admission for a COPD exacerbation approximately 2 days ago.  He admits to a persistent productive cough which remains unchanged.  He denies fever, abdominal pain, headache or visually changes.  Workup in the ED found patient to be febrile, oxygen sats were decreased at 85%, CXR showed evidence of air fluid levels overlying the right hemithorax and an increased right upper airspace opacity and a new left lung base opacity.  Follow up CT scan shows a new large empyema of the right pleural space with complex collection of air and fluid with a worsening right sided necrotizing pneumonia.  It was felt he should be admitted and he was started on Broad spectrum antibiotics.  Thoracic surgery consult has been requested for possible drainage of empyema.  Patient is a poor historian.  Currently he is doing okay.  He denies shortness of breath and discomfort.  He does remember undergoing a EBUS by Dr. Roxan Hockey in 2017.  He was diagnosed with Stage IIIA Adenocarcinoma of the RLL at that time.  He underwent Chemotherapy which was completed 09/20/2016 he also completed radiation.  He was started on  maintenance chemotherapy every 2 weeks starting 12/02/16.  His initial follow up CT scan in 05/2017 was free from disease.  However follow up scan performed in 07/2017 showed new adenopathy which was suspicious for nodal metastasis.  Left supraclavicular node biopsy was performed and confirmed metastatic adenocarcinoma of the lung.  He was restarted on Chemotherapy regimen for this and has continued this regimen until recent events.  After review of oncology notes its looks like patient originally developed shortness of breath complaints in January.  CT scan obtained at that time showed possible pneumonitis vs COPD exacerbation.  There was also evidence of small pulmonary emboli and patient was started on Eliquis at that time. He presented to the ED on 2/23 and was treated for pneumonia.  However, he states he felt like he never really got any better since that time.  He was again admitted earlier this month for COPD exacerbation and later discharged home.  He underwent MRI of the brain at follow up Oncology visit which showed no evidence of metastasis.  Finally he presented to the ED for current admission.   Current Activity/ Functional Status: Patient is independent with mobility/ambulation, transfers, ADL's, IADL's.   Past Medical History:  Diagnosis Date  . Arnold-Chiari syndrome ([place])   . Arthritis   . Asthma   . Chronic back pain   . COPD (chronic obstructive pulmonary disease) ([place])   .  Depression   . Dyspnea    with exertion   . GERD (gastroesophageal reflux disease)   . Hypertension   . Pneumonia   . Pulmonary embolus ([place])   . Seasonal allergies   . Spinal stenosis of lumbar region   . 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 Wheezing     Past Surgical History:  Procedure Laterality Date  . BACK SURGERY     5 total  . BIOPSY  01/03/2017   Procedure: BIOPSY;  Surgeon: Daneil Dolin, MD;  [facility];  Service: Endoscopy;;  gastric  . COLONOSCOPY WITH  PROPOFOL N/A 04/04/2017   Procedure: COLONOSCOPY WITH PROPOFOL;  Surgeon: Daneil Dolin, MD;  [facility];  Service: Endoscopy;  Laterality: N/A;  8:45am  . ESOPHAGOGASTRODUODENOSCOPY (EGD) WITH PROPOFOL N/A 01/03/2017   Procedure: ESOPHAGOGASTRODUODENOSCOPY (EGD) WITH PROPOFOL;  Surgeon: Daneil Dolin, MD;  [facility];  Service: Endoscopy;  Laterality: N/A;  . Venia Minks DILATION N/A 01/03/2017   Procedure: Venia Minks DILATION;  Surgeon: Daneil Dolin, MD;  [facility];  Service: Endoscopy;  Laterality: N/A;  . MULTIPLE EXTRACTIONS WITH ALVEOLOPLASTY N/A 07/08/2014   Procedure: MULTIPLE EXTRACION WITH ALVEOLOPLASTY with EXCISION LESION RIGHT SIDE OF TONGUE;  Surgeon: Gae Bon, DDS;  Location: [facility];  Service: Oral Surgery;  Laterality: N/A;  . PORTACATH PLACEMENT Right 07/30/2016   Procedure: INSERTION PORT-A-CATH;  Surgeon: Vickie Epley, MD;  Location: [facility];  Service: Vascular;  Laterality: Right;  [name] VIDEO BRONCHOSCOPY WITH ENDOBRONCHIAL ULTRASOUND N/A 07/19/2016   Procedure: VIDEO BRONCHOSCOPY WITH ENDOBRONCHIAL ULTRASOUND;  Surgeon: [name], MD;  Location: [facility];  Service: Thoracic;  Laterality: N/A;    Social History   Tobacco Use  Smoking Status Current Every Day Smoker  . Packs/day: 0.25  . Years: 38.00  . Pack years: 9.50  . Types: Cigarettes  Smokeless Tobacco Former Systems developer  . Types: Chew  Tobacco Comment   using chantix- smoking once in a while    Social History   Substance and Sexual Activity  Alcohol Use Yes  . Alcohol/week: 1.2 oz  . Types: 2 Cans of beer per week   Comment: reports drinking 0-2 beers a week    Social History   Socioeconomic History  . Marital status: Divorced    Spouse name: Not on file  . Number of children: Not on file  . Years of education: Not on file  . Highest education level: Not on file  Occupational History  . Not on file  Social Needs  . Financial resource strain: Not on file  .  Food insecurity:    Worry: Not on file    Inability: Not on file  . Transportation needs:    Medical: Not on file    Non-medical: Not on file  Tobacco Use  . Smoking status: Current Every Day Smoker    Packs/day: 0.25    Years: 38.00    Pack years: 9.50    Types: Cigarettes  . Smokeless tobacco: Former Systems developer    Types: Chew  . Tobacco comment: using chantix- smoking once in a while  Substance and Sexual Activity  . Alcohol use: Yes    Alcohol/week: 1.2 oz    Types: 2 Cans of beer per week    Comment: reports drinking 0-2 beers a week  . Drug use: Yes    Frequency: 7.0 times per week    Types: Marijuana  Comment: daily for cancer; pt denies any use at present time 08/23/17  . Sexual activity: Not Currently  Lifestyle  . Physical activity:    Days per week: Not on file    Minutes per session: Not on file  . Stress: Not on file  Relationships  . Social connections:    Talks on phone: Not on file    Gets together: Not on file    Attends religious service: Not on file    Active member of club or organization: Not on file    Attends meetings of clubs or organizations: Not on file    Relationship status: Not on file  . Intimate partner violence:    Fear of current or ex partner: Not on file    Emotionally abused: Not on file    Physically abused: Not on file    Forced sexual activity: Not on file  Other Topics Concern  . Not on file  Social History Narrative  . Not on file    Allergies  Allergen Reactions  . Demeclocycline Other (See Comments) and Swelling  . Tetracyclines & Related Swelling and Other (See Comments)         Current Facility-Administered Medications  Medication Dose Route Frequency Provider Last Rate Last Dose  . acetaminophen (TYLENOL) tablet 650 mg  650 mg Oral Q6H PRN Opyd, Ilene Qua, MD       Or  . acetaminophen (TYLENOL) suppository 650 mg  650 mg Rectal Q6H PRN Opyd, Ilene Qua, MD      . albuterol (PROVENTIL) (2.5 MG/3ML) 0.083% nebulizer  solution 2.5 mg  2.5 mg Nebulization Q4H PRN Opyd, Ilene Qua, MD      . ALPRAZolam Duanne Moron) tablet 1 mg  1 mg Oral BID PRN Opyd, Ilene Qua, MD      . alum & mag hydroxide-simeth (MAALOX/MYLANTA) 200-200-20 MG/5ML suspension 30 mL  30 mL Oral Q4H PRN Isaac Bliss, Rayford Halsted, MD   30 mL at 03/24/18 2145  . amLODipine (NORVASC) tablet 5 mg  5 mg Oral Daily Opyd, Ilene Qua, MD   5 mg at 03/25/18 0913  . escitalopram (LEXAPRO) tablet 20 mg  20 mg Oral Daily Opyd, Ilene Qua, MD   20 mg at 03/25/18 0913  . gabapentin (NEURONTIN) capsule 600 mg  600 mg Oral BID Opyd, Ilene Qua, MD   600 mg at 03/25/18 0913  . guaiFENesin (MUCINEX) 12 hr tablet 600 mg  600 mg Oral BID PRN Opyd, Ilene Qua, MD      . ipratropium-albuterol (DUONEB) 0.5-2.5 (3) MG/3ML nebulizer solution 3 mL  3 mL Inhalation BID Opyd, Ilene Qua, MD   3 mL at 03/25/18 0934  . lactose free nutrition (BOOST PLUS) liquid 237 mL  237 mL Oral BID BM Memon, Jolaine Artist, MD      . lactulose (CHRONULAC) 10 GM/15ML solution 10 g  10 g Oral TID PRN Opyd, Ilene Qua, MD      . methadone (DOLOPHINE) tablet 20 mg  20 mg Oral 5 X Daily Opyd, Ilene Qua, MD   20 mg at 03/25/18 0913  . mirtazapine (REMERON) tablet 15 mg  15 mg Oral QHS Opyd, Ilene Qua, MD   15 mg at 03/24/18 2147  . multivitamin with minerals tablet 1 tablet  1 tablet Oral Daily Opyd, Ilene Qua, MD   1 tablet at 03/25/18 0913  . neomycin-bacitracin-polymyxin (NEOSPORIN) ointment   Topical BID Kathie Dike, MD      . ondansetron (ZOFRAN) tablet 4 mg  4 mg Oral Q6H PRN Opyd, Ilene Qua, MD       Or  . ondansetron (ZOFRAN) injection 4 mg  4 mg Intravenous Q6H PRN Opyd, Ilene Qua, MD      . pantoprazole (PROTONIX) EC tablet 40 mg  40 mg Oral BID Opyd, Ilene Qua, MD   40 mg at 03/25/18 0914  . piperacillin-tazobactam (ZOSYN) IVPB 3.375 g  3.375 g Intravenous Q8H Opyd, Ilene Qua, MD   Stopped at 03/25/18 1000  . polyvinyl alcohol (LIQUIFILM TEARS) 1.4 % ophthalmic solution 2 drop  2 drop Both Eyes  Daily Opyd, Ilene Qua, MD   2 drop at 03/24/18 0944  . potassium chloride SA (K-DUR,KLOR-CON) CR tablet 20 mEq  20 mEq Oral Daily Opyd, Ilene Qua, MD   20 mEq at 03/25/18 0913  . sodium chloride flush (NS) 0.9 % injection 3 mL  3 mL Intravenous Q12H Opyd, Ilene Qua, MD   3 mL at 03/25/18 0915  . tiZANidine (ZANAFLEX) tablet 4 mg  4 mg Oral Q8H PRN Opyd, Ilene Qua, MD      . vancomycin (VANCOCIN) IVPB 1000 mg/200 mL premix  1,000 mg Intravenous Once Opyd, Ilene Qua, MD   Stopped at 03/25/18 0700  . vancomycin (VANCOCIN) IVPB 1000 mg/200 mL premix  1,000 mg Intravenous Q8H Opyd, Ilene Qua, MD   Stopped at 03/25/18 0700   Facility-Administered Medications Ordered in Other Encounters  Medication Dose Route Frequency Provider Last Rate Last Dose  . 0.9 %  sodium chloride infusion   Intravenous Continuous Baird Cancer, PA-C 10 mL/hr at 01/13/17 1330      Medications Prior to Admission  Medication Sig Dispense Refill Last Dose  . albuterol (PROVENTIL) (2.5 MG/3ML) 0.083% nebulizer solution Take 3 mLs (2.5 mg total) by nebulization every 6 (six) hours as needed for wheezing or shortness of breath (if unable to experience relief by using combivent inhaler). 75 mL 4 03/22/2018 at Unknown time  . ALPRAZolam (XANAX) 1 MG tablet Take 1 mg by mouth 2 (two) times daily as needed for anxiety or sleep (takes 1 tablet and bedtime for sleep and 1 dose during the day only if needed).    03/22/2018 at Unknown time  . amLODipine (NORVASC) 5 MG tablet Take 5 mg by mouth daily.    03/23/2018 at Unknown time  . apixaban (ELIQUIS) 5 MG TABS tablet Take 1 tablet (5 mg total) by mouth 2 (two) times daily. 60 tablet 2 03/23/2018 at 8-900a  . Artificial Tear Solution (SOOTHE XP) SOLN Apply 2 drops to eye daily.   03/23/2018 at Unknown time  . Atezolizumab (TECENTRIQ IV) Inject into the vein. Every 3 weeks   Past Month at Unknown time  . escitalopram (LEXAPRO) 20 MG tablet Take 20 mg by mouth daily.    03/23/2018 at Unknown  time  . furosemide (LASIX) 20 MG tablet Take 1 tablet (20 mg total) by mouth daily. If you do not start urinating more by tomorrow then increase it to twice a day. 60 tablet 1 03/23/2018 at Unknown time  . gabapentin (NEURONTIN) 600 MG tablet Take 600 mg by mouth 2 (two) times daily.    03/23/2018 at Unknown time  . guaiFENesin (MUCINEX) 600 MG 12 hr tablet Take 600 mg by mouth 2 (two) times daily as needed (for congestion.).   unknown  . hydrochlorothiazide (MICROZIDE) 12.5 MG capsule Take 12.5 mg by mouth daily.    03/23/2018 at Unknown time  . Ipratropium-Albuterol (COMBIVENT) 20-100 MCG/ACT AERS  respimat Inhale 1 puff into the lungs every 6 (six) hours as needed for wheezing or shortness of breath. 1 Inhaler 3 03/20/2018 at Unknown time  . lactulose (CHRONULAC) 10 GM/15ML solution Take 15 mLs (10 g total) by mouth 3 (three) times daily as needed for mild constipation or moderate constipation. 240 mL 0 Past Month at Unknown time  . methadone (DOLOPHINE) 10 MG tablet Take 20 mg by mouth 5 (five) times daily. Takes 2 tablets 5 times daily   03/23/2018 at Unknown time  . mirtazapine (REMERON) 15 MG tablet Take 1 tablet by mouth at bedtime.   03/22/2018 at Unknown time  . Multiple Vitamin (MULTIVITAMIN WITH MINERALS) TABS tablet Take 1 tablet by mouth daily. Centrum   03/23/2018 at Unknown time  . ondansetron (ZOFRAN) 8 MG tablet TAKE 1 TABLET BY MOUTH EVERY 8 HOURS AS NEEDED FOR NAUSEA AND VOMITING. 60 tablet 1 03/23/2018 at Unknown time  . pantoprazole (PROTONIX) 40 MG tablet TAKE (1) TABLET BY MOUTH TWICE DAILY. 60 tablet 0 03/23/2018 at Unknown time  . potassium chloride SA (K-DUR,KLOR-CON) 20 MEQ tablet Take 1 tablet (20 mEq total) by mouth daily. 60 tablet 1 03/23/2018 at Unknown time  . tiZANidine (ZANAFLEX) 4 MG tablet Take 4 mg by mouth every 8 (eight) hours as needed for muscle spasms.    03/22/2018 at Unknown time  . varenicline (CHANTIX) 0.5 MG tablet TAKE (2) TABLETS BY MOUTH TWICE DAILY. 112 tablet 0  Past Week at Unknown time    Family History  Problem Relation Age of Onset  . Cancer Mother        breast cancer  . Heart failure Brother   . Colon cancer Neg Hx        not sure, ?grandfather and/or uncle   Review of Systems:   ROS Pertinent items are noted in HPI.     Cardiac Review of Systems: Y or  [    ]= no  Chest Pain [ y, pleuritic  ]  Resting SOB Blue.Reese  ] Exertional SOB  [  ]  Orthopnea [  ]   Pedal Edema [   ]    Palpitations [  ] Syncope  [  ]   Presyncope [   ]  General Review of Systems: [Y] = yes [  ]=no Constitional: recent weight change [  ]; anorexia Blue.Reese  ]; fatigue Blue.Reese  ]; nausea [ y ]; night sweats [  ]; fever [  ]; or chills [  ]                                                               Dental: poor dentition[  ]; Last Dentist visit:   Eye : blurred vision [  ]; diplopia [   ]; vision changes [  ];  Amaurosis fugax[  ]; Resp: cough [  ];  wheezing[ y ];  hemoptysis[  ]; shortness of breath[  ]; paroxysmal nocturnal dyspnea[  ]; dyspnea on exertion[ y ]; or orthopnea[  ];  GI:  gallstones[  ], vomiting[  ];  dysphagia[ y, radiation, chemotherapy induced ]; melena[  ];  hematochezia [  ]; heartburn[  ];   Hx of  Colonoscopy[  ]; GU: kidney stones [  ]; hematuria[  ];  dysuria [  ];  nocturia[  ];  history of     obstruction [  ]; urinary frequency [  ]             Skin: rash, swelling[  ];, hair loss[  ];  peripheral edema[  ];  or itching[  ]; Musculosketetal: myalgias[  ];  joint swelling[  ];  joint erythema[  ];  joint pain[  ];  back pain[  ];  Heme/Lymph: bruising[  ];  bleeding[  ];  anemia[  ];  Neuro: TIA[  ];  headaches[  ];  stroke[  ];  vertigo[  ];  seizures[  ];   paresthesias[  ];  difficulty walking[  ];  Psych:depression[  ]; anxiety[  ];  Endocrine: diabetes[  ];  thyroid dysfunction[  ];  Immunizations: Flu [  ]; Pneumococcal[  ];    Physical Exam: BP 103/74 (BP Location: Left Arm)   Pulse (!) 101   Temp 98.2 F (36.8 C) (Oral)   Resp 20    Ht 6' (1.829 m)   Wt 207 lb 14.3 oz (94.3 kg)   SpO2 91%   BMI 28.20 kg/m    General appearance: alert, cooperative and no distress Head: Normocephalic, without obvious abnormality, atraumatic Resp: rhonchi bilaterally and wheezes bilaterally Cardio: regular rate and rhythm GI: soft, non-tender; bowel sounds normal; no masses,  no organomegaly Extremities: extremities normal, atraumatic, no cyanosis or edema Neurologic: Grossly normal  Diagnostic Studies & Laboratory data:     Recent Radiology Findings:   Dg Chest 2 View  Result Date: 03/23/2018 CLINICAL DATA:  RIGHT chest pain and shortness of breath. History of lung cancer. EXAM: CHEST - 2 VIEW COMPARISON:  03/06/2018 chest radiograph, 01/28/2018 CT and prior studies FINDINGS: RIGHT hemithorax volume loss again noted. Apparent air-fluid level overlying the LOWER RIGHT hemithorax is best identified on the LATERAL view and may represent a loculated pleural or pulmonary collection. There is apparent increase in airspace opacities within the visualized UPPER RIGHT lung. Mild LEFT basilar opacities noted. There is no evidence of pneumothorax. RIGHT Port-A-Cath is again noted with tip overlying the SUPERIOR cavoatrial junction. IMPRESSION: Apparent air-fluid level overlying the LOWER RIGHT hemithorax and may represent a loculated pleural or pulmonary collection. Apparent increase and UPPER RIGHT lung airspace opacities and new mild LEFT basilar opacities noted. Chest CT with contrast is recommended for further evaluation. Electronically Signed   By: Margarette Canada M.D.   On: 03/23/2018 20:03   Ct Chest W Contrast  Result Date: 03/23/2018 CLINICAL DATA:  Acute onset of shortness of breath and right-sided chest pain. Dyspnea. EXAM: CT CHEST WITH CONTRAST TECHNIQUE: Multidetector CT imaging of the chest was performed during intravenous contrast administration. CONTRAST:  75 mL ISOVUE-300 IOPAMIDOL (ISOVUE-300) INJECTION 61% COMPARISON:  Chest  radiograph performed earlier today at 7:38 p.m., and CTA of the chest performed 01/28/2018 FINDINGS: Cardiovascular: The heart is normal in size. A right-sided chest port is noted ending about the proximal right atrium. Minimal calcification is noted along the aortic arch. The great vessels are grossly unremarkable in appearance. Mediastinum/Nodes: Prominent mediastinal nodes are seen, measuring up to 1.8 cm in size at the subcarinal region. A mildly enlarged 1.2 cm node is also noted adjacent to the distal esophagus. Diffuse inflammation tracks about the esophagus and distal trachea, likely reflecting extension of the patient's severe lung infection. A small pericardial effusion is noted. The thyroid gland is unremarkable. No axillary lymphadenopathy is seen. Lungs/Pleura: There is a  new large complex collection of air and fluid at the right pleural space, with underlying pleural thickening. This is concerning for new empyema secondary to the patient's necrotizing pneumonia. Given the amount of air within the collection, a bronchopleural fistula is a concern. There is underlying diffuse consolidation of much of the right lung, worsened from the prior study. Cavitary components are again noted. Additional pneumonia is noted at the left lung apex and left lung base, new from the prior study. There appears to be opacification of the bronchus to the right lower lobe; underlying aspiration cannot be excluded. No pneumothorax is seen. Upper Abdomen: The visualized portions of the liver are unremarkable. The spleen is enlarged, measuring 14.6 cm in length. The visualized portions of the pancreas and adrenal glands are unremarkable. Musculoskeletal: No acute osseous abnormalities are identified. The visualized musculature is unremarkable in appearance. IMPRESSION: 1. New large empyema noted at the right pleural space, with a complex collection of air and fluid and underlying pleural thickening. Given the amount of air  within the collection, a bronchopleural fistula is a concern. 2. Worsening diffuse right-sided necrotizing pneumonia, and new left apical and left basilar pneumonia. 3. Mediastinal lymphadenopathy likely reflects the acute infection. Diffuse inflammation tracks about the esophagus and distal trachea, likely reflecting extension of the severe lung infection. 4. Opacification of the bronchus to the right lower lung lobe. Underlying aspiration cannot be excluded. 5. Small pericardial effusion. 6. Splenomegaly. Electronically Signed   By: Garald Balding M.D.   On: 03/23/2018 22:12     I have independently reviewed the above radiologic studies.  Recent Lab Findings: Lab Results  Component Value Date   WBC 7.6 03/24/2018   HGB 11.7 (L) 03/24/2018   HCT 36.6 (L) 03/24/2018   PLT 233 03/24/2018   GLUCOSE 91 03/24/2018   CHOL 152 05/06/2009   TRIG 62 05/06/2009   HDL 68 05/06/2009   LDLCALC 72 05/06/2009   ALT 35 03/23/2018   AST 45 (H) 03/23/2018   NA 136 03/24/2018   K 3.5 03/24/2018   CL 97 (L) 03/24/2018   CREATININE 0.58 (L) 03/24/2018   BUN 5 (L) 03/24/2018   CO2 29 03/24/2018   TSH 1.044 12/12/2017   INR 1.60 03/23/2018    Assessment / Plan:      1. Likely empyema versus urther progression of Stage IV Lung Cancer- patient is currently afebrile, no leukocytosis, stable on oxygen in room air 2. H/O PE- on Eliquis which has been administered during hospitalization.. Currently on hold 3. Dispo- patient currently stable but will likely need drainage.  Currently on eliquis for h/o PE.  We will observe for now and discuss case with Dr. Roxan Hockey on Monday to consider chest tube placement versus VATS    Erin Barrett, PA-C  03/25/2018 11:13 AM   I have seen and examined the patient and agree with the assessment and plan as outlined above by Ellwood Handler.  Patient is a 58 year old male with COPD and long-standing tobacco abuse who originally presented in 2017 with right hilar mass,  right lower lobe mass, and extensive mediastinal adenopathy.  He underwent bronchoscopy with EBUS by Dr. Roxan Hockey in August 2017 and was diagnosed with unresectable adenocarcinoma of the lung, stage IIIA (T2b, N2, M0).  He was treated with chemotherapy and radiation therapy but progressed with evidence of likely intrapulmonary metastases on follow-up CT scanning in August 2018.  He has been treated with more chemotherapy and immunotherapy despite the fact that he continues to smoke  cigarettes.  More recently he was diagnosed with pulmonary embolus and has been anticoagulated using Eliquis.  He was hospitalized in early April with shortness of breath and generalized weakness and was treated for COPD exacerbation.  He was seen in follow-up in the cancer center and MRI of the brain obtained to rule out intracranial metastasis.  He was readmitted to the hospital yesterday with worsening shortness of breath, productive cough, generalized weakness and malaise.  He was found to be febrile although white blood count was normal.  Chest CT scan reveals worsening consolidation of the right lung with likely empyema.  The patient currently reports stable breathing with no worsening of shortness of breath.  He denies any fevers although he states that he has had night sweats.  He has mild discomfort in the right chest with deep inspiration and cough.  He is not tachycardic nor tachypneic.  He was febrile to 102 F on admission but has defervesced.  White blood count is normal.  Patient will need drainage of right pleural effusion for likely empyema.  Options include simple chest tube placement with or without radiographic guidance versus video-assisted thoracoscopy and possible thoracotomy.  Given that the patient remains clinically stable at this time I would not recommend surgical intervention until the effects of Eliquis have been allowed to dissipate.  I have discussed matters at length with the patient at the  bedside.  All questions answered.  We will continue to follow with plans that Dr. Roxan Hockey will follow up on Monday.  Please call during the interim should specific problems or questions arise.   I spent in excess of 60 minutes during the conduct of this hospital encounter and >50% of this time involved direct face-to-face encounter with the patient for counseling and/or coordination of their care.   Rexene Alberts, MD 03/25/2018 1:10 PM

## 2018-03-25 NOTE — Plan of Care (Signed)
Patient actively involved with treatment plan. Able to verbalize needs and pain. Questions answered. Understanding verbalized.

## 2018-03-25 NOTE — Progress Notes (Signed)
ANTICOAGULATION CONSULT NOTE - Initial Consult  Pharmacy Consult for Heparin while Eliquis on hold Indication: pulmonary embolus history  Allergies  Allergen Reactions  . Demeclocycline Other (See Comments) and Swelling  . Tetracyclines & Related Swelling and Other (See Comments)         Patient Measurements: Height: 6' (182.9 cm) Weight: 207 lb 14.3 oz (94.3 kg) IBW/kg (Calculated) : 77.6  Vital Signs: Temp: 98.3 F (36.8 C) (04/20 1705) Temp Source: Oral (04/20 1705) BP: 118/74 (04/20 1705) Pulse Rate: 107 (04/20 1705)  Labs: Recent Labs    03/23/18 1922 03/23/18 2016 03/24/18 0528  HGB  --  13.4 11.7*  HCT  --  41.4 36.6*  PLT  --  294 233  LABPROT 18.9*  --   --   INR 1.60  --   --   CREATININE 0.77  --  0.58*    Estimated Creatinine Clearance: 124.4 mL/min (A) (by C-G formula based on SCr of 0.58 mg/dL (L)).   Medical History: Past Medical History:  Diagnosis Date  . Arnold-Chiari syndrome ([place])   . Arthritis   . Asthma   . Chronic back pain   . COPD (chronic obstructive pulmonary disease) ([place])   . Depression   . Dyspnea    with exertion   . GERD (gastroesophageal reflux disease)   . Hypertension   . Pneumonia   . Pulmonary embolus ([place])   . Seasonal allergies   . Spinal stenosis of lumbar region   . 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 Wheezing     Assessment: 57 year old male on Eliquis prior to admission for PE history, now to begin heparin while awaiting surgery and Eliquis held Last dose of Eliquis 4/18  Goal of Therapy:  Heparin level 0.3-0.7 units/ml Monitor platelets by anticoagulation protocol: Yes  PTT = 66 to 102 seconds   Plan:  Heparin 4000 units iv bolus x 1  Heparin drip at 1400 units / hr Heparin level and PTT 6 hours after heparin starts Daily heparin level, CBC, PTT  Thank you Anette Guarneri, PharmD [phone]  03/25/2018,6:41 PM

## 2018-03-25 NOTE — Progress Notes (Signed)
Initial Nutrition Assessment  DOCUMENTATION CODES:  Not applicable  INTERVENTION:  Boost Nutritional Supplement BID  Verbal Order to liberalize diet to Regular  NUTRITION DIAGNOSIS:  Increased nutrient needs related to cancer and cancer related treatments, acute illness([place]) as evidenced by estimated nutritional requirements for these condtitions  GOAL:  Patient will meet greater than or equal to 90% of their needs  MONITOR:  PO intake, Supplement acceptance, Labs, Diet advancement, I & O's  REASON FOR ASSESSMENT:  Malnutrition Screening Tool    ASSESSMENT:  57 y/o male PMHx Stage IV adenocarcinoma of R lung, undergoing chemotherapy, COPD, Chronic Resp failure, HTN, Depression/Anxiety, chronic cancer related pain. Presented w/ SOB and R side pleuritic pain x2 days. Work up shows R side large empyema. Transferred to [facility] for management.   Patient known to RD from outpatient cancer center. Historically, patient has done excellent maintaining his appetite and weight despite advanced cancer. Last tx was 3/14.  Today, patient reports that he really has not had a change in his appetite recently. He denies his chest pai and acute empyema  having much, if any, impact on his intake. At baseline, he drinks supplements when he can afford them. He has received free supplements from cancer center in past. He prefers Boost. Will order while admitted  He typically eats well during his admissions, provided that he is on a Regular diet; this is his most frequent complaint. He says he will always eat well "if the fiood is decent". Given his advanced cancer and apparent weight loss, feel this is appropriate. Will ask MD if can liberalize diet.   Pt's weight fluctuates often. He HAD been 210-230 for the past year. He is below this now. Likely related to increased energy expenditure in setting of recent acute illnesses.   Physical Exam: Mild fat wasting of orbitals. Mild muscle wasting of lower  extremities and temporalis.   Insignificant criteria to dx malnutrition.   Labs: Albumin on admit: 2.2, Bgs 90-140. Renal fx stable.  Meds: Methadone, Remeron, MVI, PPI, KCL, IVF, IV abx,   Recent Labs  Lab 03/23/18 1922 03/24/18 0528  NA 134* 136  K 3.7 3.5  CL 92* 97*  CO2 28 29  BUN 5* 5*  CREATININE 0.77 0.58*  CALCIUM 8.3* 7.9*  GLUCOSE 137* 91   NUTRITION - FOCUSED PHYSICAL EXAM:   Most Recent Value  Orbital Region  Mild depletion  Upper Arm Region  No depletion  Thoracic and Lumbar Region  No depletion  Buccal Region  No depletion  Temple Region  Mild depletion  Clavicle Bone Region  No depletion  Clavicle and Acromion Bone Region  No depletion  Scapular Bone Region  No depletion  Dorsal Hand  No depletion  Patellar Region  No depletion  Anterior Thigh Region  Mild depletion  Posterior Calf Region  Mild depletion     Diet Order:  Diet regular Room service appropriate? Yes; Fluid consistency: Thin  EDUCATION NEEDS:  No education needs have been identified at this time  Skin:  Skin Assessment: Reviewed RN Assessment  Last BM:  4/18  Height:  Ht Readings from Last 1 Encounters:  03/24/18 6' (1.829 m)   Weight:  Wt Readings from Last 1 Encounters:  03/25/18 207 lb 14.3 oz (94.3 kg)   Wt Readings from Last 10 Encounters:  03/25/18 207 lb 14.3 oz (94.3 kg)  03/14/18 219 lb 9.6 oz (99.6 kg)  03/08/18 220 lb 10.9 oz (100.1 kg)  02/16/18 219 lb 3.2 oz (  99.4 kg)  02/09/18 209 lb 8 oz (95 kg)  01/29/18 210 lb 12.2 oz (95.6 kg)  01/23/18 224 lb (101.6 kg)  01/02/18 221 lb (100.2 kg)  01/02/18 221 lb (100.2 kg)  12/26/17 212 lb 6.4 oz (96.3 kg)   Ideal Body Weight:  80.91 kg  BMI:  Body mass index is 28.2 kg/m.  Estimated Nutritional Needs:  Kcal:  5396-7289 (23-25 kcal/kg bw) Protein:  105-120g Pro (1.1-1.3g/kg bw) Fluid:  2.2-2.4 L fluid (1 ml/kcal)  Burtis Junes RD, LDN, CNSC Clinical Nutrition Available Tues-Sat via Pager: [phone] 03/25/2018  10:21 AM

## 2018-03-25 NOTE — Consult Note (Addendum)
[address]       [place] [address]             [phone]        Erik B Calvario [place] Medical Record #[id number] Date of Birth: 04-26-1961  Referring: Memon Primary Care: [name], MD Primary Cardiologist:No primary care provider on file.  Chief Complaint:    Chief Complaint  Patient presents with  . 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  He presented to the ED on 03/23/2018 with complaints of increased shortness of breath and right sided pleuritic chest pain.  The patient had been in his normal state of health until a recent admission for a COPD exacerbation approximately 2 days ago.  He admits to a persistent productive cough which remains unchanged.  He denies fever, abdominal pain, headache or visually changes.  Workup in the ED found patient to be febrile, oxygen sats were decreased at 85%, CXR showed evidence of air fluid levels overlying the right hemithorax and an increased right upper airspace opacity and a new left lung base opacity.  Follow up CT scan shows a new large empyema of the right pleural space with complex collection of air and fluid with a worsening right sided necrotizing pneumonia.  It was felt he should be admitted and he was started on Broad spectrum antibiotics.  Thoracic surgery consult has been requested for possible drainage of empyema.  Patient is a poor historian.  Currently he is doing okay.  He denies shortness of breath and discomfort.  He does remember undergoing a EBUS by Dr. Roxan Hockey in 2017.  He was diagnosed with Stage IIIA Adenocarcinoma of the RLL at that time.  He underwent Chemotherapy which was completed 09/20/2016 he also completed radiation.  He was started on  maintenance chemotherapy every 2 weeks starting 12/02/16.  His initial follow up CT scan in 05/2017 was free from disease.  However follow up scan performed in 07/2017 showed new adenopathy which was suspicious for nodal metastasis.  Left supraclavicular node biopsy was performed and confirmed metastatic adenocarcinoma of the lung.  He was restarted on Chemotherapy regimen for this and has continued this regimen until recent events.  After review of oncology notes its looks like patient originally developed shortness of breath complaints in January.  CT scan obtained at that time showed possible pneumonitis vs COPD exacerbation.  There was also evidence of small pulmonary emboli and patient was started on Eliquis at that time. He presented to the ED on 2/23 and was treated for pneumonia.  However, he states he felt like he never really got any better since that time.  He was again admitted earlier this month for COPD exacerbation and later discharged home.  He underwent MRI of the brain at follow up Oncology visit which showed no evidence of metastasis.  Finally he presented to the ED for current admission.   Current Activity/ Functional Status: Patient is independent with mobility/ambulation, transfers, ADL's, IADL's.   Past Medical History:  Diagnosis Date  . Arnold-Chiari syndrome ([place])   . Arthritis   . Asthma   . Chronic back pain   . COPD (chronic obstructive pulmonary disease) ([place])   .  Depression   . Dyspnea    with exertion   . GERD (gastroesophageal reflux disease)   . Hypertension   . Pneumonia   . Pulmonary embolus ([place])   . Seasonal allergies   . Spinal stenosis of lumbar region   . 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 Wheezing     Past Surgical History:  Procedure Laterality Date  . BACK SURGERY     5 total  . BIOPSY  01/03/2017   Procedure: BIOPSY;  Surgeon: Daneil Dolin, MD;  [facility];  Service: Endoscopy;;  gastric  . COLONOSCOPY WITH  PROPOFOL N/A 04/04/2017   Procedure: COLONOSCOPY WITH PROPOFOL;  Surgeon: Daneil Dolin, MD;  [facility];  Service: Endoscopy;  Laterality: N/A;  8:45am  . ESOPHAGOGASTRODUODENOSCOPY (EGD) WITH PROPOFOL N/A 01/03/2017   Procedure: ESOPHAGOGASTRODUODENOSCOPY (EGD) WITH PROPOFOL;  Surgeon: Daneil Dolin, MD;  [facility];  Service: Endoscopy;  Laterality: N/A;  . Venia Minks DILATION N/A 01/03/2017   Procedure: Venia Minks DILATION;  Surgeon: Daneil Dolin, MD;  [facility];  Service: Endoscopy;  Laterality: N/A;  . MULTIPLE EXTRACTIONS WITH ALVEOLOPLASTY N/A 07/08/2014   Procedure: MULTIPLE EXTRACION WITH ALVEOLOPLASTY with EXCISION LESION RIGHT SIDE OF TONGUE;  Surgeon: Gae Bon, DDS;  Location: [facility];  Service: Oral Surgery;  Laterality: N/A;  . PORTACATH PLACEMENT Right 07/30/2016   Procedure: INSERTION PORT-A-CATH;  Surgeon: Vickie Epley, MD;  Location: [facility];  Service: Vascular;  Laterality: Right;  [name] VIDEO BRONCHOSCOPY WITH ENDOBRONCHIAL ULTRASOUND N/A 07/19/2016   Procedure: VIDEO BRONCHOSCOPY WITH ENDOBRONCHIAL ULTRASOUND;  Surgeon: [name], MD;  Location: [facility];  Service: Thoracic;  Laterality: N/A;    Social History   Tobacco Use  Smoking Status Current Every Day Smoker  . Packs/day: 0.25  . Years: 38.00  . Pack years: 9.50  . Types: Cigarettes  Smokeless Tobacco Former Systems developer  . Types: Chew  Tobacco Comment   using chantix- smoking once in a while    Social History   Substance and Sexual Activity  Alcohol Use Yes  . Alcohol/week: 1.2 oz  . Types: 2 Cans of beer per week   Comment: reports drinking 0-2 beers a week    Social History   Socioeconomic History  . Marital status: Divorced    Spouse name: Not on file  . Number of children: Not on file  . Years of education: Not on file  . Highest education level: Not on file  Occupational History  . Not on file  Social Needs  . Financial resource strain: Not on file  .  Food insecurity:    Worry: Not on file    Inability: Not on file  . Transportation needs:    Medical: Not on file    Non-medical: Not on file  Tobacco Use  . Smoking status: Current Every Day Smoker    Packs/day: 0.25    Years: 38.00    Pack years: 9.50    Types: Cigarettes  . Smokeless tobacco: Former Systems developer    Types: Chew  . Tobacco comment: using chantix- smoking once in a while  Substance and Sexual Activity  . Alcohol use: Yes    Alcohol/week: 1.2 oz    Types: 2 Cans of beer per week    Comment: reports drinking 0-2 beers a week  . Drug use: Yes    Frequency: 7.0 times per week    Types: Marijuana  Comment: daily for cancer; pt denies any use at present time 08/23/17  . Sexual activity: Not Currently  Lifestyle  . Physical activity:    Days per week: Not on file    Minutes per session: Not on file  . Stress: Not on file  Relationships  . Social connections:    Talks on phone: Not on file    Gets together: Not on file    Attends religious service: Not on file    Active member of club or organization: Not on file    Attends meetings of clubs or organizations: Not on file    Relationship status: Not on file  . Intimate partner violence:    Fear of current or ex partner: Not on file    Emotionally abused: Not on file    Physically abused: Not on file    Forced sexual activity: Not on file  Other Topics Concern  . Not on file  Social History Narrative  . Not on file    Allergies  Allergen Reactions  . Demeclocycline Other (See Comments) and Swelling  . Tetracyclines & Related Swelling and Other (See Comments)         Current Facility-Administered Medications  Medication Dose Route Frequency Provider Last Rate Last Dose  . acetaminophen (TYLENOL) tablet 650 mg  650 mg Oral Q6H PRN Opyd, Ilene Qua, MD       Or  . acetaminophen (TYLENOL) suppository 650 mg  650 mg Rectal Q6H PRN Opyd, Ilene Qua, MD      . albuterol (PROVENTIL) (2.5 MG/3ML) 0.083% nebulizer  solution 2.5 mg  2.5 mg Nebulization Q4H PRN Opyd, Ilene Qua, MD      . ALPRAZolam Duanne Moron) tablet 1 mg  1 mg Oral BID PRN Opyd, Ilene Qua, MD      . alum & mag hydroxide-simeth (MAALOX/MYLANTA) 200-200-20 MG/5ML suspension 30 mL  30 mL Oral Q4H PRN Isaac Bliss, Rayford Halsted, MD   30 mL at 03/24/18 2145  . amLODipine (NORVASC) tablet 5 mg  5 mg Oral Daily Opyd, Ilene Qua, MD   5 mg at 03/25/18 0913  . escitalopram (LEXAPRO) tablet 20 mg  20 mg Oral Daily Opyd, Ilene Qua, MD   20 mg at 03/25/18 0913  . gabapentin (NEURONTIN) capsule 600 mg  600 mg Oral BID Opyd, Ilene Qua, MD   600 mg at 03/25/18 0913  . guaiFENesin (MUCINEX) 12 hr tablet 600 mg  600 mg Oral BID PRN Opyd, Ilene Qua, MD      . ipratropium-albuterol (DUONEB) 0.5-2.5 (3) MG/3ML nebulizer solution 3 mL  3 mL Inhalation BID Opyd, Ilene Qua, MD   3 mL at 03/25/18 0934  . lactose free nutrition (BOOST PLUS) liquid 237 mL  237 mL Oral BID BM Memon, Jolaine Artist, MD      . lactulose (CHRONULAC) 10 GM/15ML solution 10 g  10 g Oral TID PRN Opyd, Ilene Qua, MD      . methadone (DOLOPHINE) tablet 20 mg  20 mg Oral 5 X Daily Opyd, Ilene Qua, MD   20 mg at 03/25/18 0913  . mirtazapine (REMERON) tablet 15 mg  15 mg Oral QHS Opyd, Ilene Qua, MD   15 mg at 03/24/18 2147  . multivitamin with minerals tablet 1 tablet  1 tablet Oral Daily Opyd, Ilene Qua, MD   1 tablet at 03/25/18 0913  . neomycin-bacitracin-polymyxin (NEOSPORIN) ointment   Topical BID Kathie Dike, MD      . ondansetron (ZOFRAN) tablet 4 mg  4 mg Oral Q6H PRN Opyd, Ilene Qua, MD       Or  . ondansetron (ZOFRAN) injection 4 mg  4 mg Intravenous Q6H PRN Opyd, Ilene Qua, MD      . pantoprazole (PROTONIX) EC tablet 40 mg  40 mg Oral BID Opyd, Ilene Qua, MD   40 mg at 03/25/18 0914  . piperacillin-tazobactam (ZOSYN) IVPB 3.375 g  3.375 g Intravenous Q8H Opyd, Ilene Qua, MD   Stopped at 03/25/18 1000  . polyvinyl alcohol (LIQUIFILM TEARS) 1.4 % ophthalmic solution 2 drop  2 drop Both Eyes  Daily Opyd, Ilene Qua, MD   2 drop at 03/24/18 0944  . potassium chloride SA (K-DUR,KLOR-CON) CR tablet 20 mEq  20 mEq Oral Daily Opyd, Ilene Qua, MD   20 mEq at 03/25/18 0913  . sodium chloride flush (NS) 0.9 % injection 3 mL  3 mL Intravenous Q12H Opyd, Ilene Qua, MD   3 mL at 03/25/18 0915  . tiZANidine (ZANAFLEX) tablet 4 mg  4 mg Oral Q8H PRN Opyd, Ilene Qua, MD      . vancomycin (VANCOCIN) IVPB 1000 mg/200 mL premix  1,000 mg Intravenous Once Opyd, Ilene Qua, MD   Stopped at 03/25/18 0700  . vancomycin (VANCOCIN) IVPB 1000 mg/200 mL premix  1,000 mg Intravenous Q8H Opyd, Ilene Qua, MD   Stopped at 03/25/18 0700   Facility-Administered Medications Ordered in Other Encounters  Medication Dose Route Frequency Provider Last Rate Last Dose  . 0.9 %  sodium chloride infusion   Intravenous Continuous Baird Cancer, PA-C 10 mL/hr at 01/13/17 1330      Medications Prior to Admission  Medication Sig Dispense Refill Last Dose  . albuterol (PROVENTIL) (2.5 MG/3ML) 0.083% nebulizer solution Take 3 mLs (2.5 mg total) by nebulization every 6 (six) hours as needed for wheezing or shortness of breath (if unable to experience relief by using combivent inhaler). 75 mL 4 03/22/2018 at Unknown time  . ALPRAZolam (XANAX) 1 MG tablet Take 1 mg by mouth 2 (two) times daily as needed for anxiety or sleep (takes 1 tablet and bedtime for sleep and 1 dose during the day only if needed).    03/22/2018 at Unknown time  . amLODipine (NORVASC) 5 MG tablet Take 5 mg by mouth daily.    03/23/2018 at Unknown time  . apixaban (ELIQUIS) 5 MG TABS tablet Take 1 tablet (5 mg total) by mouth 2 (two) times daily. 60 tablet 2 03/23/2018 at 8-900a  . Artificial Tear Solution (SOOTHE XP) SOLN Apply 2 drops to eye daily.   03/23/2018 at Unknown time  . Atezolizumab (TECENTRIQ IV) Inject into the vein. Every 3 weeks   Past Month at Unknown time  . escitalopram (LEXAPRO) 20 MG tablet Take 20 mg by mouth daily.    03/23/2018 at Unknown  time  . furosemide (LASIX) 20 MG tablet Take 1 tablet (20 mg total) by mouth daily. If you do not start urinating more by tomorrow then increase it to twice a day. 60 tablet 1 03/23/2018 at Unknown time  . gabapentin (NEURONTIN) 600 MG tablet Take 600 mg by mouth 2 (two) times daily.    03/23/2018 at Unknown time  . guaiFENesin (MUCINEX) 600 MG 12 hr tablet Take 600 mg by mouth 2 (two) times daily as needed (for congestion.).   unknown  . hydrochlorothiazide (MICROZIDE) 12.5 MG capsule Take 12.5 mg by mouth daily.    03/23/2018 at Unknown time  . Ipratropium-Albuterol (COMBIVENT) 20-100 MCG/ACT AERS  respimat Inhale 1 puff into the lungs every 6 (six) hours as needed for wheezing or shortness of breath. 1 Inhaler 3 03/20/2018 at Unknown time  . lactulose (CHRONULAC) 10 GM/15ML solution Take 15 mLs (10 g total) by mouth 3 (three) times daily as needed for mild constipation or moderate constipation. 240 mL 0 Past Month at Unknown time  . methadone (DOLOPHINE) 10 MG tablet Take 20 mg by mouth 5 (five) times daily. Takes 2 tablets 5 times daily   03/23/2018 at Unknown time  . mirtazapine (REMERON) 15 MG tablet Take 1 tablet by mouth at bedtime.   03/22/2018 at Unknown time  . Multiple Vitamin (MULTIVITAMIN WITH MINERALS) TABS tablet Take 1 tablet by mouth daily. Centrum   03/23/2018 at Unknown time  . ondansetron (ZOFRAN) 8 MG tablet TAKE 1 TABLET BY MOUTH EVERY 8 HOURS AS NEEDED FOR NAUSEA AND VOMITING. 60 tablet 1 03/23/2018 at Unknown time  . pantoprazole (PROTONIX) 40 MG tablet TAKE (1) TABLET BY MOUTH TWICE DAILY. 60 tablet 0 03/23/2018 at Unknown time  . potassium chloride SA (K-DUR,KLOR-CON) 20 MEQ tablet Take 1 tablet (20 mEq total) by mouth daily. 60 tablet 1 03/23/2018 at Unknown time  . tiZANidine (ZANAFLEX) 4 MG tablet Take 4 mg by mouth every 8 (eight) hours as needed for muscle spasms.    03/22/2018 at Unknown time  . varenicline (CHANTIX) 0.5 MG tablet TAKE (2) TABLETS BY MOUTH TWICE DAILY. 112 tablet 0  Past Week at Unknown time    Family History  Problem Relation Age of Onset  . Cancer Mother        breast cancer  . Heart failure Brother   . Colon cancer Neg Hx        not sure, ?grandfather and/or uncle   Review of Systems:   ROS Pertinent items are noted in HPI.     Cardiac Review of Systems: Y or  [    ]= no  Chest Pain [ y, pleuritic  ]  Resting SOB Blue.Reese  ] Exertional SOB  [  ]  Orthopnea [  ]   Pedal Edema [   ]    Palpitations [  ] Syncope  [  ]   Presyncope [   ]  General Review of Systems: [Y] = yes [  ]=no Constitional: recent weight change [  ]; anorexia Blue.Reese  ]; fatigue Blue.Reese  ]; nausea [ y ]; night sweats [  ]; fever [  ]; or chills [  ]                                                               Dental: poor dentition[  ]; Last Dentist visit:   Eye : blurred vision [  ]; diplopia [   ]; vision changes [  ];  Amaurosis fugax[  ]; Resp: cough [  ];  wheezing[ y ];  hemoptysis[  ]; shortness of breath[  ]; paroxysmal nocturnal dyspnea[  ]; dyspnea on exertion[ y ]; or orthopnea[  ];  GI:  gallstones[  ], vomiting[  ];  dysphagia[ y, radiation, chemotherapy induced ]; melena[  ];  hematochezia [  ]; heartburn[  ];   Hx of  Colonoscopy[  ]; GU: kidney stones [  ]; hematuria[  ];  dysuria [  ];  nocturia[  ];  history of     obstruction [  ]; urinary frequency [  ]             Skin: rash, swelling[  ];, hair loss[  ];  peripheral edema[  ];  or itching[  ]; Musculosketetal: myalgias[  ];  joint swelling[  ];  joint erythema[  ];  joint pain[  ];  back pain[  ];  Heme/Lymph: bruising[  ];  bleeding[  ];  anemia[  ];  Neuro: TIA[  ];  headaches[  ];  stroke[  ];  vertigo[  ];  seizures[  ];   paresthesias[  ];  difficulty walking[  ];  Psych:depression[  ]; anxiety[  ];  Endocrine: diabetes[  ];  thyroid dysfunction[  ];  Immunizations: Flu [  ]; Pneumococcal[  ];    Physical Exam: BP 103/74 (BP Location: Left Arm)   Pulse (!) 101   Temp 98.2 F (36.8 C) (Oral)   Resp 20    Ht 6' (1.829 m)   Wt 207 lb 14.3 oz (94.3 kg)   SpO2 91%   BMI 28.20 kg/m    General appearance: alert, cooperative and no distress Head: Normocephalic, without obvious abnormality, atraumatic Resp: rhonchi bilaterally and wheezes bilaterally Cardio: regular rate and rhythm GI: soft, non-tender; bowel sounds normal; no masses,  no organomegaly Extremities: extremities normal, atraumatic, no cyanosis or edema Neurologic: Grossly normal  Diagnostic Studies & Laboratory data:     Recent Radiology Findings:   Dg Chest 2 View  Result Date: 03/23/2018 CLINICAL DATA:  RIGHT chest pain and shortness of breath. History of lung cancer. EXAM: CHEST - 2 VIEW COMPARISON:  03/06/2018 chest radiograph, 01/28/2018 CT and prior studies FINDINGS: RIGHT hemithorax volume loss again noted. Apparent air-fluid level overlying the LOWER RIGHT hemithorax is best identified on the LATERAL view and may represent a loculated pleural or pulmonary collection. There is apparent increase in airspace opacities within the visualized UPPER RIGHT lung. Mild LEFT basilar opacities noted. There is no evidence of pneumothorax. RIGHT Port-A-Cath is again noted with tip overlying the SUPERIOR cavoatrial junction. IMPRESSION: Apparent air-fluid level overlying the LOWER RIGHT hemithorax and may represent a loculated pleural or pulmonary collection. Apparent increase and UPPER RIGHT lung airspace opacities and new mild LEFT basilar opacities noted. Chest CT with contrast is recommended for further evaluation. Electronically Signed   By: Margarette Canada M.D.   On: 03/23/2018 20:03   Ct Chest W Contrast  Result Date: 03/23/2018 CLINICAL DATA:  Acute onset of shortness of breath and right-sided chest pain. Dyspnea. EXAM: CT CHEST WITH CONTRAST TECHNIQUE: Multidetector CT imaging of the chest was performed during intravenous contrast administration. CONTRAST:  75 mL ISOVUE-300 IOPAMIDOL (ISOVUE-300) INJECTION 61% COMPARISON:  Chest  radiograph performed earlier today at 7:38 p.m., and CTA of the chest performed 01/28/2018 FINDINGS: Cardiovascular: The heart is normal in size. A right-sided chest port is noted ending about the proximal right atrium. Minimal calcification is noted along the aortic arch. The great vessels are grossly unremarkable in appearance. Mediastinum/Nodes: Prominent mediastinal nodes are seen, measuring up to 1.8 cm in size at the subcarinal region. A mildly enlarged 1.2 cm node is also noted adjacent to the distal esophagus. Diffuse inflammation tracks about the esophagus and distal trachea, likely reflecting extension of the patient's severe lung infection. A small pericardial effusion is noted. The thyroid gland is unremarkable. No axillary lymphadenopathy is seen. Lungs/Pleura: There is a  new large complex collection of air and fluid at the right pleural space, with underlying pleural thickening. This is concerning for new empyema secondary to the patient's necrotizing pneumonia. Given the amount of air within the collection, a bronchopleural fistula is a concern. There is underlying diffuse consolidation of much of the right lung, worsened from the prior study. Cavitary components are again noted. Additional pneumonia is noted at the left lung apex and left lung base, new from the prior study. There appears to be opacification of the bronchus to the right lower lobe; underlying aspiration cannot be excluded. No pneumothorax is seen. Upper Abdomen: The visualized portions of the liver are unremarkable. The spleen is enlarged, measuring 14.6 cm in length. The visualized portions of the pancreas and adrenal glands are unremarkable. Musculoskeletal: No acute osseous abnormalities are identified. The visualized musculature is unremarkable in appearance. IMPRESSION: 1. New large empyema noted at the right pleural space, with a complex collection of air and fluid and underlying pleural thickening. Given the amount of air  within the collection, a bronchopleural fistula is a concern. 2. Worsening diffuse right-sided necrotizing pneumonia, and new left apical and left basilar pneumonia. 3. Mediastinal lymphadenopathy likely reflects the acute infection. Diffuse inflammation tracks about the esophagus and distal trachea, likely reflecting extension of the severe lung infection. 4. Opacification of the bronchus to the right lower lung lobe. Underlying aspiration cannot be excluded. 5. Small pericardial effusion. 6. Splenomegaly. Electronically Signed   By: Garald Balding M.D.   On: 03/23/2018 22:12     I have independently reviewed the above radiologic studies.  Recent Lab Findings: Lab Results  Component Value Date   WBC 7.6 03/24/2018   HGB 11.7 (L) 03/24/2018   HCT 36.6 (L) 03/24/2018   PLT 233 03/24/2018   GLUCOSE 91 03/24/2018   CHOL 152 05/06/2009   TRIG 62 05/06/2009   HDL 68 05/06/2009   LDLCALC 72 05/06/2009   ALT 35 03/23/2018   AST 45 (H) 03/23/2018   NA 136 03/24/2018   K 3.5 03/24/2018   CL 97 (L) 03/24/2018   CREATININE 0.58 (L) 03/24/2018   BUN 5 (L) 03/24/2018   CO2 29 03/24/2018   TSH 1.044 12/12/2017   INR 1.60 03/23/2018    Assessment / Plan:      1. Likely empyema versus urther progression of Stage IV Lung Cancer- patient is currently afebrile, no leukocytosis, stable on oxygen in room air 2. H/O PE- on Eliquis which has been administered during hospitalization.. Currently on hold 3. Dispo- patient currently stable but will likely need drainage.  Currently on eliquis for h/o PE.  We will observe for now and discuss case with Dr. Roxan Hockey on Monday to consider chest tube placement versus VATS    Erin Barrett, PA-C  03/25/2018 11:13 AM   I have seen and examined the patient and agree with the assessment and plan as outlined above by Ellwood Handler.  Patient is a 57 year old male with COPD and long-standing tobacco abuse who originally presented in 2017 with right hilar mass,  right lower lobe mass, and extensive mediastinal adenopathy.  He underwent bronchoscopy with EBUS by Dr. Roxan Hockey in August 2017 and was diagnosed with unresectable adenocarcinoma of the lung, stage IIIA (T2b, N2, M0).  He was treated with chemotherapy and radiation therapy but progressed with evidence of likely intrapulmonary metastases on follow-up CT scanning in August 2018.  He has been treated with more chemotherapy and immunotherapy despite the fact that he continues to smoke  cigarettes.  More recently he was diagnosed with pulmonary embolus and has been anticoagulated using Eliquis.  He was hospitalized in early April with shortness of breath and generalized weakness and was treated for COPD exacerbation.  He was seen in follow-up in the cancer center and MRI of the brain obtained to rule out intracranial metastasis.  He was readmitted to the hospital yesterday with worsening shortness of breath, productive cough, generalized weakness and malaise.  He was found to be febrile although white blood count was normal.  Chest CT scan reveals worsening consolidation of the right lung with likely empyema.  The patient currently reports stable breathing with no worsening of shortness of breath.  He denies any fevers although he states that he has had night sweats.  He has mild discomfort in the right chest with deep inspiration and cough.  He is not tachycardic nor tachypneic.  He was febrile to 102 F on admission but has defervesced.  White blood count is normal.  Patient will need drainage of right pleural effusion for likely empyema.  Options include simple chest tube placement with or without radiographic guidance versus video-assisted thoracoscopy and possible thoracotomy.  Given that the patient remains clinically stable at this time I would not recommend surgical intervention until the effects of Eliquis have been allowed to dissipate.  I have discussed matters at length with the patient at the  bedside.  All questions answered.  We will continue to follow with plans that Dr. Roxan Hockey will follow up on Monday.  Please call during the interim should specific problems or questions arise.   I spent in excess of 60 minutes during the conduct of this hospital encounter and >50% of this time involved direct face-to-face encounter with the patient for counseling and/or coordination of their care.   Rexene Alberts, MD 03/25/2018 1:10 PM

## 2018-03-25 NOTE — Progress Notes (Signed)
Pharmacy Antibiotic Note  Erik [name] is a 57 y.o. male admitted on 03/23/2018 with sepsis.  Pharmacy has been consulted for Vancomycin dosing.  VT today 22 (drawn appropriately). CrCl~110 ml/min. Pt afebrile, WBC wnl.   Plan: Decrease vancomcyin gtt to 750 mg IV Q8h  Zosyn 3.375g IV q8h (4 hour infusion) Monitor labs, c/s, and vanco trough as indicated   Height: 6' (182.9 cm) Weight: 207 lb 14.3 oz (94.3 kg) IBW/kg (Calculated) : 77.6  Temp (24hrs), Avg:99.5 F (37.5 C), Min:98.2 F (36.8 C), Max:100.6 F (38.1 C)  Recent Labs  Lab 03/23/18 1922 03/23/18 1944 03/23/18 2016 03/23/18 2250 03/23/18 2317 03/24/18 0204 03/24/18 0528 03/25/18 1319  WBC  --   --  15.4*  --   --   --  7.6  --   CREATININE 0.77  --   --   --   --   --  0.58*  --   LATICACIDVEN  --  2.68*  --  1.24 0.8 1.0  --   --   VANCOTROUGH  --   --   --   --   --   --   --  22*    Estimated Creatinine Clearance: 124.4 mL/min (A) (by C-G formula based on SCr of 0.58 mg/dL (L)).    Allergies  Allergen Reactions  . Demeclocycline Other (See Comments) and Swelling  . Tetracyclines & Related Swelling and Other (See Comments)         Antimicrobials this admission: Vanco 4/18 >>  Zosyn 4/18 >>   Dose adjustments this admission: 4/20 Vancomycin 1000 mg Q8h>>Vancomycin 750 mg Q8h 4/20 VT 22  Microbiology results: 4/18 BCx: pending  Leroy Libman, PharmD Pharmacy Resident Pager: [phone]

## 2018-03-25 NOTE — Progress Notes (Addendum)
PROGRESS NOTE    CLARNCE HOMAN  WNI:[id number] DOB: May 04, 1961 DOA: 03/23/2018 PCP: [name], MD    Brief Narrative:  57 year old male with a history of COPD on 2 L oxygen, lung cancer, presented to [facility] with shortness of breath and fevers.  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  He was transferred to [name] for further treatments.  On intravenous antibiotics.  Cardiothoracic surgery consult has been requested.  He is chronically on anticoagulation for pulmonary embolus diagnosed in 12/2017.  Anticoagulation currently on hold.   Assessment & Plan:   Principal Problem:   Empyema of lung ([place]) Active Problems:   Depression with anxiety   Adenocarcinoma of right lung (HCC)   COPD (chronic obstructive pulmonary disease) ([place])   Essential hypertension   Acute on chronic respiratory failure with hypoxia (HCC)   Sepsis due to pneumonia ([place])   History of pulmonary embolism   Empyema lung ([place])   1. Acute on chronic respiratory failure with hypoxia.  Patient is chronically on 2 L of oxygen.  Currently requiring 4 L of oxygen.  Related to underlying pneumonia/empyema. 2. Right lung empyema.  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  Currently on intravenous antibiotics.  Respiratory cultures in process.  Discussed case with Dr. Roxy Manns, on-call for cardiothoracic surgery who will see the patient. 3. Adenocarcinoma of the right lung.  Patient is currently undergoing chemotherapy every 3 weeks.  He completed radiation therapy approximately a year ago. 4. COPD.  Has mild wheeze bilaterally, but does not appear to be significantly short of breath.  Continue bronchodilators as needed. 5. Previous history of pulmonary embolism.  Patient was diagnosed with a small pulmonary embolus in 12/2017 and has been on anticoagulation.  Last dose of Eliquis was this morning.  This is been discontinued since he will likely need further surgical  management.  Will likely start intravenous heparin after evaluation by cardiothoracic surgery which can be discontinued prior to any surgical procedure. 6. Hypertension.  Continue Norvasc.  Blood pressure stable. 7. Chronic pain syndrome.  Continue on methadone.   DVT prophylaxis: SCDs Code Status: Full code Family Communication: No family present Disposition Plan: Pending hospital course, likely discharge home when improved   Consultants:   Cardiothoracic surgery  Procedures:     Antimicrobials:   Vancomycin 4/18>  Zosyn 4/18 >   Subjective: Overall is feeling better since admission.  Has productive cough.  Still short of breath, but better than he was on admission.  Does not feel feverish.  Objective: Vitals:   03/24/18 2053 03/25/18 0058 03/25/18 0500 03/25/18 0727  BP:  123/70  103/74  Pulse:  (!) 108  (!) 101  Resp:  14  20  Temp:  98.7 F (37.1 C)  98.2 F (36.8 C)  TempSrc:  Oral  Oral  SpO2: 94% 91%  90%  Weight:   94.3 kg (207 lb 14.3 oz)   Height:        Intake/Output Summary (Last 24 hours) at 03/25/2018 3818 Last data filed at 03/25/2018 0900 Gross per 24 hour  Intake 1233 ml  Output 895 ml  Net 338 ml   Filed Weights   03/24/18 1900 03/25/18 0500  Weight: 99.6 kg (219 lb 9.6 oz) 94.3 kg (207 lb 14.3 oz)    Examination:  General exam: Appears calm and comfortable  Respiratory system: diminished breath sounds at right base, mild wheeze bilaterally. Respiratory effort normal. Cardiovascular system: S1 & S2 heard, RRR.  No JVD, murmurs, rubs, gallops or clicks.  Trace pedal edema bilaterally. Gastrointestinal system: Abdomen is nondistended, soft and nontender. No organomegaly or masses felt. Normal bowel sounds heard. Central nervous system: Alert and oriented. No focal neurological deficits. Extremities: Symmetric 5 x 5 power. Skin: No rashes, lesions or ulcers Psychiatry: Judgement and insight appear normal. Mood & affect appropriate.      Data Reviewed: I have personally reviewed following labs and imaging studies  CBC: Recent Labs  Lab 03/23/18 2016 03/24/18 0528  WBC 15.4* 7.6  NEUTROABS 13.7* 6.5  HGB 13.4 11.7*  HCT 41.4 36.6*  MCV 91.4 92.2  PLT 294 702   Basic Metabolic Panel: Recent Labs  Lab 03/23/18 1922 03/24/18 0528  NA 134* 136  K 3.7 3.5  CL 92* 97*  CO2 28 29  GLUCOSE 137* 91  BUN 5* 5*  CREATININE 0.77 0.58*  CALCIUM 8.3* 7.9*   GFR: Estimated Creatinine Clearance: 124.4 mL/min (A) (by C-G formula based on SCr of 0.58 mg/dL (L)). Liver Function Tests: Recent Labs  Lab 03/23/18 1922  AST 45*  ALT 35  ALKPHOS 220*  BILITOT 1.2  PROT 7.8  ALBUMIN 2.2*   No results for input(s): LIPASE, AMYLASE in the last 168 hours. No results for input(s): AMMONIA in the last 168 hours. Coagulation Profile: Recent Labs  Lab 03/23/18 1922  INR 1.60   Cardiac Enzymes: No results for input(s): CKTOTAL, CKMB, CKMBINDEX, TROPONINI in the last 168 hours. BNP (last 3 results) No results for input(s): PROBNP in the last 8760 hours. HbA1C: No results for input(s): HGBA1C in the last 72 hours. CBG: No results for input(s): GLUCAP in the last 168 hours. Lipid Profile: No results for input(s): CHOL, HDL, LDLCALC, TRIG, CHOLHDL, LDLDIRECT in the last 72 hours. Thyroid Function Tests: No results for input(s): TSH, T4TOTAL, FREET4, T3FREE, THYROIDAB in the last 72 hours. Anemia Panel: No results for input(s): VITAMINB12, FOLATE, FERRITIN, TIBC, IRON, RETICCTPCT in the last 72 hours. Sepsis Labs: Recent Labs  Lab 03/23/18 1944 03/23/18 2250 03/23/18 2317 03/24/18 0204  LATICACIDVEN 2.68* 1.24 0.8 1.0    Recent Results (from the past 240 hour(s))  Culture, blood (Routine x 2)     Status: None (Preliminary result)   Collection Time: 03/23/18  7:23 PM  Result Value Ref Range Status   Specimen Description RIGHT ANTECUBITAL  Final   Special Requests   Final    BOTTLES DRAWN AEROBIC AND  ANAEROBIC Blood Culture adequate volume   Culture   Final    NO GROWTH 2 DAYS Performed at [facility], [address], [place], [place] [address]    Report Status PENDING  Incomplete  Culture, blood (Routine x 2)     Status: None (Preliminary result)   Collection Time: 03/23/18  7:23 PM  Result Value Ref Range Status   Specimen Description BLOOD RIGHT WRIST  Final   Special Requests   Final    BOTTLES DRAWN AEROBIC AND ANAEROBIC Blood Culture adequate volume   Culture   Final    NO GROWTH 2 DAYS Performed at [facility], [address], [place], [place] [address]    Report Status PENDING  Incomplete  MRSA PCR Screening     Status: None   Collection Time: 03/24/18  3:36 PM  Result Value Ref Range Status   MRSA by PCR NEGATIVE NEGATIVE Final    Comment:        The GeneXpert MRSA Assay (FDA approved for NASAL specimens only), is  one component of a comprehensive MRSA colonization surveillance program. It is not intended to diagnose MRSA infection nor to guide or monitor treatment for MRSA infections. Performed at [facility], [address] [place], [place] [address]   Culture, respiratory (NON-Expectorated)     Status: None (Preliminary result)   Collection Time: 03/24/18  3:49 PM  Result Value Ref Range Status   Specimen Description   Final    EXPECTORATED SPUTUM Performed at [facility], [address], [place], [place] [address]    Special Requests   Final    NONE Performed at [facility], [address], [place], [place] [address]    Gram Stain   Final    ABUNDANT WBC PRESENT,BOTH PMN AND MONONUCLEAR RARE GRAM POSITIVE COCCI Performed at [facility], [place] [address] [place], [place] [address]    Culture PENDING  Incomplete   Report Status PENDING  Incomplete         Radiology Studies: Dg Chest 2 View  Result Date: 03/23/2018 CLINICAL DATA:  RIGHT chest pain and shortness of breath. History of lung cancer. EXAM: CHEST - 2 VIEW COMPARISON:   03/06/2018 chest radiograph, 01/28/2018 CT and prior studies FINDINGS: RIGHT hemithorax volume loss again noted. Apparent air-fluid level overlying the LOWER RIGHT hemithorax is best identified on the LATERAL view and may represent a loculated pleural or pulmonary collection. There is apparent increase in airspace opacities within the visualized UPPER RIGHT lung. Mild LEFT basilar opacities noted. There is no evidence of pneumothorax. RIGHT Port-A-Cath is again noted with tip overlying the SUPERIOR cavoatrial junction. IMPRESSION: Apparent air-fluid level overlying the LOWER RIGHT hemithorax and may represent a loculated pleural or pulmonary collection. Apparent increase and UPPER RIGHT lung airspace opacities and new mild LEFT basilar opacities noted. Chest CT with contrast is recommended for further evaluation. Electronically Signed   By: Margarette Canada M.D.   On: 03/23/2018 20:03   Ct Chest W Contrast  Result Date: 03/23/2018 CLINICAL DATA:  Acute onset of shortness of breath and right-sided chest pain. Dyspnea. EXAM: CT CHEST WITH CONTRAST TECHNIQUE: Multidetector CT imaging of the chest was performed during intravenous contrast administration. CONTRAST:  75 mL ISOVUE-300 IOPAMIDOL (ISOVUE-300) INJECTION 61% COMPARISON:  Chest radiograph performed earlier today at 7:38 p.m., and CTA of the chest performed 01/28/2018 FINDINGS: Cardiovascular: The heart is normal in size. A right-sided chest port is noted ending about the proximal right atrium. Minimal calcification is noted along the aortic arch. The great vessels are grossly unremarkable in appearance. Mediastinum/Nodes: Prominent mediastinal nodes are seen, measuring up to 1.8 cm in size at the subcarinal region. A mildly enlarged 1.2 cm node is also noted adjacent to the distal esophagus. Diffuse inflammation tracks about the esophagus and distal trachea, likely reflecting extension of the patient's severe lung infection. A small pericardial effusion is  noted. The thyroid gland is unremarkable. No axillary lymphadenopathy is seen. Lungs/Pleura: There is a new large complex collection of air and fluid at the right pleural space, with underlying pleural thickening. This is concerning for new empyema secondary to the patient's necrotizing pneumonia. Given the amount of air within the collection, a bronchopleural fistula is a concern. There is underlying diffuse consolidation of much of the right lung, worsened from the prior study. Cavitary components are again noted. Additional pneumonia is noted at the left lung apex and left lung base, new from the prior study. There appears to be opacification of the bronchus to the right lower lobe; underlying aspiration  cannot be excluded. No pneumothorax is seen. Upper Abdomen: The visualized portions of the liver are unremarkable. The spleen is enlarged, measuring 14.6 cm in length. The visualized portions of the pancreas and adrenal glands are unremarkable. Musculoskeletal: No acute osseous abnormalities are identified. The visualized musculature is unremarkable in appearance. IMPRESSION: 1. New large empyema noted at the right pleural space, with a complex collection of air and fluid and underlying pleural thickening. Given the amount of air within the collection, a bronchopleural fistula is a concern. 2. Worsening diffuse right-sided necrotizing pneumonia, and new left apical and left basilar pneumonia. 3. Mediastinal lymphadenopathy likely reflects the acute infection. Diffuse inflammation tracks about the esophagus and distal trachea, likely reflecting extension of the severe lung infection. 4. Opacification of the bronchus to the right lower lung lobe. Underlying aspiration cannot be excluded. 5. Small pericardial effusion. 6. Splenomegaly. Electronically Signed   By: Garald Balding M.D.   On: 03/23/2018 22:12        Scheduled Meds: . amLODipine  5 mg Oral Daily  . escitalopram  20 mg Oral Daily  . gabapentin   600 mg Oral BID  . ipratropium-albuterol  3 mL Inhalation BID  . methadone  20 mg Oral 5 X Daily  . mirtazapine  15 mg Oral QHS  . multivitamin with minerals  1 tablet Oral Daily  . pantoprazole  40 mg Oral BID  . polyvinyl alcohol  2 drop Both Eyes Daily  . potassium chloride SA  20 mEq Oral Daily  . sodium chloride flush  3 mL Intravenous Q12H   Continuous Infusions: . piperacillin-tazobactam (ZOSYN)  IV 3.375 g (03/25/18 0528)  . vancomycin Stopped (03/25/18 0700)  . vancomycin 1,000 mg (03/25/18 0529)     LOS: 2 days    Time spent: 79mins    Kathie Dike, MD [facility] Pager [phone]  If 7PM-7AM, please contact night-coverage [email or web address] Password [email or web address] 03/25/2018, 9:22 AM    Addendum 18:00  CT surgery note reviewed. Since patient is clinically stable at this time, no plans for urgent surgical intervention today. This may occur on Monday. Continue to hold eliquis. Since patient had recent PE, will anticoagulate with IV heparin for now. This can be held before any anticipated procedures.  [employer]

## 2018-03-26 LAB — CBC
HCT: 36.8 % — ABNORMAL LOW (ref 39.0–52.0)
Hemoglobin: 11.6 g/dL — ABNORMAL LOW (ref 13.0–17.0)
MCH: 29.5 pg (ref 26.0–34.0)
MCHC: 31.5 g/dL (ref 30.0–36.0)
MCV: 93.6 fL (ref 78.0–100.0)
PLATELETS: 216 10*3/uL (ref 150–400)
RBC: 3.93 MIL/uL — ABNORMAL LOW (ref 4.22–5.81)
RDW: 16.2 % — AB (ref 11.5–15.5)
WBC: 6 10*3/uL (ref 4.0–10.5)

## 2018-03-26 LAB — APTT
APTT: 62 s — AB (ref 24–36)
APTT: 53 s — AB (ref 24–36)
aPTT: 93 seconds — ABNORMAL HIGH (ref 24–36)

## 2018-03-26 LAB — HEPARIN LEVEL (UNFRACTIONATED)
HEPARIN UNFRACTIONATED: 0.39 [IU]/mL (ref 0.30–0.70)
HEPARIN UNFRACTIONATED: 0.21 [IU]/mL — AB (ref 0.30–0.70)
Heparin Unfractionated: 0.18 IU/mL — ABNORMAL LOW (ref 0.30–0.70)

## 2018-03-26 LAB — CULTURE, RESPIRATORY: CULTURE: NORMAL

## 2018-03-26 LAB — CULTURE, RESPIRATORY W GRAM STAIN

## 2018-03-26 NOTE — Progress Notes (Signed)
[place] for Heparin while Eliquis on hold Indication: pulmonary embolus history  Allergies  Allergen Reactions  . Demeclocycline Other (See Comments) and Swelling  . Tetracyclines & Related Swelling and Other (See Comments)         Patient Measurements: Height: 6' (182.9 cm) Weight: 212 lb 4.9 oz (96.3 kg) IBW/kg (Calculated) : 77.6  Vital Signs: Temp: 97.7 F (36.5 C) (04/21 1631) Temp Source: Oral (04/21 1631) BP: 104/71 (04/21 1631) Pulse Rate: 100 (04/21 1631)  Labs: Recent Labs    03/24/18 0528 03/26/18 0440 03/26/18 1230 03/26/18 2110 03/26/18 2111  HGB 11.7* 11.6*  --   --   --   HCT 36.6* 36.8*  --   --   --   PLT 233 216  --   --   --   APTT  --  62* 53*  --  93*  HEPARINUNFRC  --  0.39 0.21* 0.18*  --   CREATININE 0.58*  --   --   --   --     Estimated Creatinine Clearance: 125.6 mL/min (A) (by C-G formula based on SCr of 0.58 mg/dL (L)).   Medical History: Past Medical History:  Diagnosis Date  . Arnold-Chiari syndrome ([place])   . Arthritis   . Asthma   . Chronic back pain   . COPD (chronic obstructive pulmonary disease) ([place])   . Depression   . Dyspnea    with exertion   . GERD (gastroesophageal reflux disease)   . Hypertension   . Pneumonia   . Pulmonary embolus ([place])   . Seasonal allergies   . Spinal stenosis of lumbar region   . 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 Wheezing     Assessment: 57 year old male on Eliquis prior to admission for PE history. Eliquis being held and on heparin while awaiting surgery. Last dose of Eliquis 4/18.  PM PTT = 93 seconds  Goal of Therapy:  Heparin level 0.3-0.7 units/ml Monitor platelets by anticoagulation protocol: Yes  aPTT = 66 to 102 seconds   Plan:  Continue heparin at 1600 units / hr Continue to monitor aPTT and heparin level until they correlate, then d/c aPTT  Thank you Anette Guarneri, PharmD [phone]

## 2018-03-26 NOTE — Progress Notes (Signed)
PROGRESS NOTE    Erik [name]  TMH:[id number] DOB: [birth year] DOA: 03/23/2018 PCP: [name], MD    Brief Narrative:  57 year old male with a history of COPD on 2 L oxygen, lung cancer, presented to [facility] with shortness of breath and fevers.  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  He was transferred to [facility] for further treatments.  On intravenous antibiotics.  Cardiothoracic surgery following. May need chest tube vs. VATS.  He is chronically on anticoagulation for pulmonary embolus diagnosed in 12/2017.  Currently anticoagulated with IV heparin that can be discontinued prior to any procedure.   Assessment & Plan:   Principal Problem:   Empyema of lung ([place]) Active Problems:   Depression with anxiety   Adenocarcinoma of right lung (HCC)   COPD (chronic obstructive pulmonary disease) ([place])   Essential hypertension   Acute on chronic respiratory failure with hypoxia (HCC)   Sepsis due to pneumonia ([place])   History of pulmonary embolism   Empyema lung ([place])   1. Acute on chronic respiratory failure with hypoxia.  Patient is chronically on 2 L of oxygen.  Currently requiring 4 L of oxygen.  Related to underlying pneumonia/empyema. 2. Right lung empyema.  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  Currently on intravenous antibiotics.  Respiratory cultures unrevealing. Blood cultures show no growth.  CVTS following. Dr. Roxan Hockey to see in AM. May need chest tube vs. VATS 3. Adenocarcinoma of the right lung.  Patient is currently undergoing chemotherapy/immunotherapy every 3 weeks.  He completed radiation therapy approximately a year ago. 4. COPD.  Has mild wheeze bilaterally, but does not appear to be significantly short of breath.  Continue bronchodilators as needed. 5. Previous history of pulmonary embolism.  Patient was diagnosed with a small pulmonary embolus in 12/2017 and has been on anticoagulation.  Last dose of  Eliquis was morning of 4/20.  This has been discontinued and he is currently on IV heparin, which can be discontinued prior to any surgical procedures 6. Hypertension.  Continue Norvasc.  Blood pressure stable. 7. Chronic pain syndrome.  Continue on methadone.   DVT prophylaxis: SCDs Code Status: Full code Family Communication: No family present Disposition Plan: Pending hospital course, likely discharge home when improved   Consultants:   Cardiothoracic surgery  Procedures:     Antimicrobials:   Vancomycin 4/18>  Zosyn 4/18 >   Subjective: Continues to have cough. Still feels short of breath. Possibly slightly more short of breath when compared to yesterday. Oxygen weaned down to 3L. Says he was more comfortable on 4L.  Objective: Vitals:   03/25/18 2307 03/26/18 0500 03/26/18 0814 03/26/18 0909  BP: 112/70  119/79   Pulse: (!) 102  91   Resp: 18  20   Temp: 98.1 F (36.7 C)  (!) 97.5 F (36.4 C)   TempSrc: Oral  Oral   SpO2: 91%  90% 94%  Weight:  96.3 kg (212 lb 4.9 oz)    Height:        Intake/Output Summary (Last 24 hours) at 03/26/2018 1437 Last data filed at 03/26/2018 1050 Gross per 24 hour  Intake 1074.23 ml  Output 1350 ml  Net -275.77 ml   Filed Weights   03/24/18 1900 03/25/18 0500 03/26/18 0500  Weight: 99.6 kg (219 lb 9.6 oz) 94.3 kg (207 lb 14.3 oz) 96.3 kg (212 lb 4.9 oz)    Examination:  General exam: Alert, awake, oriented x 3 Respiratory system: diminished breath  sounds at right base. Respiratory effort normal. Cardiovascular system:RRR. No murmurs, rubs, gallops. Gastrointestinal system: Abdomen is nondistended, soft and nontender. No organomegaly or masses felt. Normal bowel sounds heard. Central nervous system: Alert and oriented. No focal neurological deficits. Extremities: trace edema bilaterally Skin: No rashes, lesions or ulcers Psychiatry: Judgement and insight appear normal. Mood & affect appropriate.   Data Reviewed: I have  personally reviewed following labs and imaging studies  CBC: Recent Labs  Lab 03/23/18 2016 03/24/18 0528 03/26/18 0440  WBC 15.4* 7.6 6.0  NEUTROABS 13.7* 6.5  --   HGB 13.4 11.7* 11.6*  HCT 41.4 36.6* 36.8*  MCV 91.4 92.2 93.6  PLT 294 233 102   Basic Metabolic Panel: Recent Labs  Lab 03/23/18 1922 03/24/18 0528  NA 134* 136  K 3.7 3.5  CL 92* 97*  CO2 28 29  GLUCOSE 137* 91  BUN 5* 5*  CREATININE 0.77 0.58*  CALCIUM 8.3* 7.9*   GFR: Estimated Creatinine Clearance: 125.6 mL/min (A) (by C-G formula based on SCr of 0.58 mg/dL (L)). Liver Function Tests: Recent Labs  Lab 03/23/18 1922  AST 45*  ALT 35  ALKPHOS 220*  BILITOT 1.2  PROT 7.8  ALBUMIN 2.2*   No results for input(s): LIPASE, AMYLASE in the last 168 hours. No results for input(s): AMMONIA in the last 168 hours. Coagulation Profile: Recent Labs  Lab 03/23/18 1922  INR 1.60   Cardiac Enzymes: No results for input(s): CKTOTAL, CKMB, CKMBINDEX, TROPONINI in the last 168 hours. BNP (last 3 results) No results for input(s): PROBNP in the last 8760 hours. HbA1C: No results for input(s): HGBA1C in the last 72 hours. CBG: No results for input(s): GLUCAP in the last 168 hours. Lipid Profile: No results for input(s): CHOL, HDL, LDLCALC, TRIG, CHOLHDL, LDLDIRECT in the last 72 hours. Thyroid Function Tests: No results for input(s): TSH, T4TOTAL, FREET4, T3FREE, THYROIDAB in the last 72 hours. Anemia Panel: No results for input(s): VITAMINB12, FOLATE, FERRITIN, TIBC, IRON, RETICCTPCT in the last 72 hours. Sepsis Labs: Recent Labs  Lab 03/23/18 1944 03/23/18 2250 03/23/18 2317 03/24/18 0204  LATICACIDVEN 2.68* 1.24 0.8 1.0    Recent Results (from the past 240 hour(s))  Culture, blood (Routine x 2)     Status: None (Preliminary result)   Collection Time: 03/23/18  7:23 PM  Result Value Ref Range Status   Specimen Description RIGHT ANTECUBITAL  Final   Special Requests   Final    BOTTLES DRAWN  AEROBIC AND ANAEROBIC Blood Culture adequate volume   Culture   Final    NO GROWTH 3 DAYS Performed at [facility], [address] [place], [place] [address]    Report Status PENDING  Incomplete  Culture, blood (Routine x 2)     Status: None (Preliminary result)   Collection Time: 03/23/18  7:23 PM  Result Value Ref Range Status   Specimen Description BLOOD RIGHT WRIST  Final   Special Requests   Final    BOTTLES DRAWN AEROBIC AND ANAEROBIC Blood Culture adequate volume   Culture   Final    NO GROWTH 3 DAYS Performed at [facility], [address], [place], [place] [address]    Report Status PENDING  Incomplete  MRSA PCR Screening     Status: None   Collection Time: 03/24/18  3:36 PM  Result Value Ref Range Status   MRSA by PCR NEGATIVE NEGATIVE Final    Comment:        The GeneXpert MRSA Assay (  FDA approved for NASAL specimens only), is one component of a comprehensive MRSA colonization surveillance program. It is not intended to diagnose MRSA infection nor to guide or monitor treatment for MRSA infections. Performed at [facility], [address] [place], [place] [address]   Culture, respiratory (NON-Expectorated)     Status: None   Collection Time: 03/24/18  3:49 PM  Result Value Ref Range Status   Specimen Description   Final    EXPECTORATED SPUTUM Performed at [facility] [address], [place], [place] [address]    Special Requests   Final    NONE Performed at [facility], [address], [place], [place] [address]    Gram Stain   Final    ABUNDANT WBC PRESENT,BOTH PMN AND MONONUCLEAR RARE GRAM POSITIVE COCCI    Culture   Final    FEW Consistent with normal respiratory flora. Performed at [facility], [place] [address], [place],  [address]    Report Status 03/26/2018 FINAL  Final         Radiology Studies: No results found.      Scheduled Meds: . amLODipine  5 mg Oral Daily  . escitalopram  20 mg Oral Daily  . gabapentin  600  mg Oral BID  . ipratropium-albuterol  3 mL Inhalation BID  . lactose free nutrition  237 mL Oral BID BM  . methadone  20 mg Oral 5 X Daily  . mirtazapine  15 mg Oral QHS  . multivitamin with minerals  1 tablet Oral Daily  . neomycin-bacitracin-polymyxin   Topical BID  . pantoprazole  40 mg Oral BID  . polyvinyl alcohol  2 drop Both Eyes Daily  . potassium chloride SA  20 mEq Oral Daily  . sodium chloride flush  3 mL Intravenous Q12H   Continuous Infusions: . heparin 1,500 Units/hr (03/26/18 0817)  . piperacillin-tazobactam (ZOSYN)  IV 3.375 g (03/26/18 1417)  . vancomycin Stopped (03/26/18 0917)     LOS: 3 days    Time spent: 48mins    Kathie Dike, MD [facility] Pager [phone]  If 7PM-7AM, please contact night-coverage [email or web address] Password [facility] 03/26/2018, 2:37 PM

## 2018-03-26 NOTE — Progress Notes (Signed)
[place] for Heparin while Eliquis on hold Indication: pulmonary embolus history  Allergies  Allergen Reactions  . Demeclocycline Other (See Comments) and Swelling  . Tetracyclines & Related Swelling and Other (See Comments)         Patient Measurements: Height: 6' (182.9 cm) Weight: 207 lb 14.3 oz (94.3 kg) IBW/kg (Calculated) : 77.6  Vital Signs: Temp: 98.1 F (36.7 C) (04/20 2307) Temp Source: Oral (04/20 2307) BP: 112/70 (04/20 2307) Pulse Rate: 102 (04/20 2307)  Labs: Recent Labs    03/23/18 1922  03/23/18 2016 03/24/18 0528 03/26/18 0440  HGB  --    < > 13.4 11.7* 11.6*  HCT  --   --  41.4 36.6* 36.8*  PLT  --   --  294 233 216  APTT  --   --   --   --  62*  LABPROT 18.9*  --   --   --   --   INR 1.60  --   --   --   --   HEPARINUNFRC  --   --   --   --  0.39  CREATININE 0.77  --   --  0.58*  --    < > = values in this interval not displayed.    Estimated Creatinine Clearance: 124.4 mL/min (A) (by C-G formula based on SCr of 0.58 mg/dL (L)).   Medical History: Past Medical History:  Diagnosis Date  . 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 Arthritis   . Asthma   . Chronic back pain   . COPD (chronic obstructive pulmonary disease) ([place])   . Depression   . Dyspnea    with exertion   . GERD (gastroesophageal reflux disease)   . Hypertension   . Pneumonia   . Pulmonary embolus ([place])   . Seasonal allergies   . Spinal stenosis of lumbar region   . 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 Wheezing     Assessment: 57 year old male on Eliquis prior to admission for PE history, now to begin heparin while awaiting surgery and Eliquis held Last dose of Eliquis 4/18 Heparin level this am 0.39 units/ml, aPTT 62 sec  Goal of Therapy:  Heparin level 0.3-0.7 units/ml Monitor platelets by anticoagulation protocol: Yes  PTT = 66 to 102 seconds   Plan:  Heparin drip at 1500 units / hr Heparin level and  PTT 6 hours after rate increase  Thank you Excell Seltzer, PharmD [phone] 03/26/2018,5:55 AM

## 2018-03-26 NOTE — Progress Notes (Addendum)
[place] for Heparin while Eliquis on hold Indication: pulmonary embolus history  Allergies  Allergen Reactions  . Demeclocycline Other (See Comments) and Swelling  . Tetracyclines & Related Swelling and Other (See Comments)         Patient Measurements: Height: 6' (182.9 cm) Weight: 212 lb 4.9 oz (96.3 kg) IBW/kg (Calculated) : 77.6  Vital Signs: Temp: 97.5 F (36.4 C) (04/21 0814) Temp Source: Oral (04/21 0814) BP: 119/79 (04/21 0814) Pulse Rate: 91 (04/21 0814)  Labs: Recent Labs    03/23/18 1922  03/23/18 2016 03/24/18 0528 03/26/18 0440 03/26/18 1230  HGB  --    < > 13.4 11.7* 11.6*  --   HCT  --   --  41.4 36.6* 36.8*  --   PLT  --   --  294 233 216  --   APTT  --   --   --   --  62* 53*  LABPROT 18.9*  --   --   --   --   --   INR 1.60  --   --   --   --   --   HEPARINUNFRC  --   --   --   --  0.39 0.21*  CREATININE 0.77  --   --  0.58*  --   --    < > = values in this interval not displayed.    Estimated Creatinine Clearance: 125.6 mL/min (A) (by C-G formula based on SCr of 0.58 mg/dL (L)).   Medical History: Past Medical History:  Diagnosis Date  . 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 Arthritis   . Asthma   . Chronic back pain   . COPD (chronic obstructive pulmonary disease) ([place])   . Depression   . Dyspnea    with exertion   . GERD (gastroesophageal reflux disease)   . Hypertension   . Pneumonia   . Pulmonary embolus ([place])   . Seasonal allergies   . Spinal stenosis of lumbar region   . 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 Wheezing     Assessment: 57 year old male on Eliquis prior to admission for PE history. Eliquis being held and on heparin while awaiting surgery. Last dose of Eliquis 4/18.  Heparin level 0.21 units/ml, aPTT 53 sec. RN confirms no interruptions to therapy or issues with IV line.   Goal of Therapy:  Heparin level 0.3-0.7 units/ml Monitor platelets by  anticoagulation protocol: Yes  aPTT = 66 to 102 seconds   Plan:  Increase Heparin drip to 1600 units/hr Heparin level and aPTT 6 hours after rate increase Continue to monitor aPTT and heparin level until they correlate, then d/c aPTT  Leroy Libman, PharmD Pharmacy Resident Pager: [phone]

## 2018-03-27 LAB — COMPREHENSIVE METABOLIC PANEL
ALK PHOS: 216 U/L — AB (ref 38–126)
ALT: 28 U/L (ref 17–63)
AST: 49 U/L — AB (ref 15–41)
Albumin: 1.7 g/dL — ABNORMAL LOW (ref 3.5–5.0)
Anion gap: 8 (ref 5–15)
BUN: 5 mg/dL — ABNORMAL LOW (ref 6–20)
CALCIUM: 8 mg/dL — AB (ref 8.9–10.3)
CHLORIDE: 98 mmol/L — AB (ref 101–111)
CO2: 31 mmol/L (ref 22–32)
CREATININE: 0.7 mg/dL (ref 0.61–1.24)
GFR calc Af Amer: >60 mL/min (ref >60)
Glucose, Bld: 130 mg/dL — ABNORMAL HIGH (ref 65–99)
Potassium: 4 mmol/L (ref 3.5–5.1)
SODIUM: 137 mmol/L (ref 135–145)
Total Bilirubin: 0.4 mg/dL (ref 0.3–1.2)
Total Protein: 6.6 g/dL (ref 6.5–8.1)

## 2018-03-27 LAB — BASIC METABOLIC PANEL
Anion gap: 6 (ref 5–15)
CALCIUM: 7.9 mg/dL — AB (ref 8.9–10.3)
CO2: 32 mmol/L (ref 22–32)
CREATININE: 0.55 mg/dL — AB (ref 0.61–1.24)
Chloride: 99 mmol/L — ABNORMAL LOW (ref 101–111)
GFR calc Af Amer: >60 mL/min (ref >60)
GLUCOSE: 88 mg/dL (ref 65–99)
Potassium: 3.6 mmol/L (ref 3.5–5.1)
Sodium: 137 mmol/L (ref 135–145)

## 2018-03-27 LAB — URINALYSIS, ROUTINE W REFLEX MICROSCOPIC
Bilirubin Urine: NEGATIVE
Glucose, UA: NEGATIVE mg/dL
HGB URINE DIPSTICK: NEGATIVE
Ketones, ur: NEGATIVE mg/dL
LEUKOCYTES UA: NEGATIVE
Nitrite: NEGATIVE
PROTEIN: NEGATIVE mg/dL
Specific Gravity, Urine: 1.015 (ref 1.005–1.030)
pH: 5 (ref 5.0–8.0)

## 2018-03-27 LAB — CBC
HCT: 37.2 % — ABNORMAL LOW (ref 39.0–52.0)
HEMATOCRIT: 37.7 % — AB (ref 39.0–52.0)
HEMOGLOBIN: 11.4 g/dL — AB (ref 13.0–17.0)
Hemoglobin: 11.7 g/dL — ABNORMAL LOW (ref 13.0–17.0)
MCH: 29.6 pg (ref 26.0–34.0)
MCH: 29.5 pg (ref 26.0–34.0)
MCHC: 31 g/dL (ref 30.0–36.0)
MCHC: 30.6 g/dL (ref 30.0–36.0)
MCV: 95.4 fL (ref 78.0–100.0)
MCV: 96.4 fL (ref 78.0–100.0)
PLATELETS: 226 10*3/uL (ref 150–400)
PLATELETS: 239 10*3/uL (ref 150–400)
RBC: 3.95 MIL/uL — ABNORMAL LOW (ref 4.22–5.81)
RBC: 3.86 MIL/uL — AB (ref 4.22–5.81)
RDW: 16.4 % — ABNORMAL HIGH (ref 11.5–15.5)
RDW: 16.5 % — ABNORMAL HIGH (ref 11.5–15.5)
WBC: 5.5 10*3/uL (ref 4.0–10.5)
WBC: 5.8 10*3/uL (ref 4.0–10.5)

## 2018-03-27 LAB — BLOOD GAS, ARTERIAL
Acid-Base Excess: 8.9 mmol/L — ABNORMAL HIGH (ref 0.0–2.0)
BICARBONATE: 34 mmol/L — AB (ref 20.0–28.0)
Drawn by: 275531
O2 Content: 4 L/min
O2 Saturation: 92.9 %
PCO2 ART: 57.1 mmHg — AB (ref 32.0–48.0)
PO2 ART: 67.5 mmHg — AB (ref 83.0–108.0)
Patient temperature: 98.6
pH, Arterial: 7.392 (ref 7.350–7.450)

## 2018-03-27 LAB — APTT
APTT: 49 s — AB (ref 24–36)
aPTT: 91 seconds — ABNORMAL HIGH (ref 24–36)

## 2018-03-27 LAB — VANCOMYCIN, TROUGH: Vancomycin Tr: 11 ug/mL — ABNORMAL LOW (ref 15–20)

## 2018-03-27 LAB — PROTIME-INR
INR: 1.19
Prothrombin Time: 15 seconds (ref 11.4–15.2)

## 2018-03-27 LAB — ABO/RH: ABO/RH(D): O NEG

## 2018-03-27 LAB — HEPARIN LEVEL (UNFRACTIONATED): HEPARIN UNFRACTIONATED: 0.26 [IU]/mL — AB (ref 0.30–0.70)

## 2018-03-27 MED ORDER — VANCOMYCIN HCL IN DEXTROSE 1-5 GM/200ML-% IV SOLN
1000.0000 mg | Freq: Three times a day (TID) | INTRAVENOUS | Status: DC
  Administered 2018-03-27 – 2018-04-04 (×24): 1000 mg via INTRAVENOUS
  Filled 2018-03-27 (×25): qty 200

## 2018-03-27 MED ORDER — IPRATROPIUM-ALBUTEROL 0.5-2.5 (3) MG/3ML IN SOLN
3.0000 mL | Freq: Four times a day (QID) | RESPIRATORY_TRACT | Status: DC
  Administered 2018-03-27 – 2018-03-28 (×3): 3 mL via RESPIRATORY_TRACT
  Filled 2018-03-27 (×4): qty 3

## 2018-03-27 NOTE — Care Management Important Message (Signed)
Important Message  Patient Details  Name: Erik [name] MRN: [id number] Date of Birth: 21-Jun-1961   Medicare Important Message Given:  Yes    [name] 03/27/2018, 1:33 PM

## 2018-03-27 NOTE — Progress Notes (Signed)
Procedure(s) (LRB): VIDEO ASSISTED THORACOSCOPY (VATS)/EMPYEMA (Right) DECORTICATION (Right) Subjective: Patient seen and examined, chart reviewed.   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 He was treated with chemotherapy and radiation. He was then treated with durvalumab. Noted to have a right pleural effusion in 02/2017. Cytology of pleural effusion negative in August 2018. Has recently been on Tecentriq. On Eliquis PTA for a DVT.  He was hospitalized for a COPD exacerbation in early April. He never really improved during that admission. Presented back to ED on 4/19 with fever of 102 F, productive cough, wheezing and right sided pleuritic CP. A CXR showed a complicated effusion with air fluid levels. A CT of the chest showed a loculated empyema. He was started on Vanco and Zosyn.   Seen in consultation by Dr. Roxy Manns. Recommended decortication but needed to be off Eliquis for 48 hours prior to surgery. Symptoms have improved since hospitalized but have not resolved completely.   Objective: Vital signs in last 24 hours: Temp:  [97.6 F (36.4 C)-98.6 F (37 C)] 98.6 F (37 C) (04/22 1616) Pulse Rate:  [101-106] 101 (04/22 1616) Resp:  [18-21] 21 (04/22 1616) BP: (105-119)/(68-79) 105/68 (04/22 1616) SpO2:  [92 %-94 %] 93 % (04/22 1616) Weight:  [212 lb 4.8 oz (96.3 kg)] 212 lb 4.8 oz (96.3 kg) (04/22 0500)  Hemodynamic parameters for last 24 hours:    Intake/Output from previous day: 04/21 0701 - 04/22 0700 In: 990.1 [P.O.:240; I.V.:150.1; IV Piggyback:600] Out: 1100 [Urine:1100] Intake/Output this shift: No intake/output data recorded.  General appearance: alert, cooperative and mild distress Neurologic: intact Heart: regular rate and rhythm Lungs: rhonchi bilaterally, wheezes bilaterally and transmitted heard on right Abdomen: normal findings: soft, non-tender Extremities: well perused  Lab Results: Recent Labs     03/26/18 0440 03/27/18 0620  WBC 6.0 5.5  HGB 11.6* 11.7*  HCT 36.8* 37.7*  PLT 216 226   BMET:  Recent Labs    03/27/18 0620  NA 137  K 3.6  CL 99*  CO2 32  GLUCOSE 88  BUN <5*  CREATININE 0.55*  CALCIUM 7.9*    PT/INR: No results for input(s): LABPROT, INR in the last 72 hours. ABG    Component Value Date/Time   PHART 7.290 (L) 07/19/2016 1511   HCO3 27.4 (H) 07/19/2016 1511   TCO2 29.2 07/19/2016 1511   ACIDBASEDEF 4.0 (H) 07/19/2016 1407   O2SAT 95.4 07/19/2016 1511   CBG (last 3)  No results for input(s): GLUCAP in the last 72 hours.  Assessment/Plan: S/P Procedure(s) (LRB): VIDEO ASSISTED THORACOSCOPY (VATS)/EMPYEMA (Right) DECORTICATION (Right) =  57 yo man with stage IV adenocarcinoma of the lung treated with chemoradiation as well as targeted agents. Has had a long standing right pleural effusion. Now presents with pneumonia complicated by a large right empyema.  I discussed the options of chest tube drainage v VATS for attempted decortication with Mr. [name]. He understands that we may not be able to get the lung to reexpand in this setting. I do not think drainage with or without thrombolytics will be effective in this setting. I think the best hope for reexpansion of the lung is with VATS for drainage of the empyema and decortication. If unable to get the lung to reexpand, we may have to leave the tubes in long term. I discussed the general nature of the operation with Mr. [name] including the need for general anesthesia, the incisions  to be used and the use of drainage tubes postoperatively. We reviewed the indications, risks, benefits and alternatives. He understands the risks include but are not limited to death, MI, DVT, PE, bleeding, possible need for transfusion, infection, prolonged air leak, incomplete reexpansion of the lung, as well as the possibility of other unforeseeable complications. He accepts the risks and agrees to proceed.  Plan OR in  Am  Stop heparin at 0200 tomorrow. Will resume Eliquis when safe postoperatively   LOS: 4 days    [name] 03/27/2018

## 2018-03-27 NOTE — Progress Notes (Signed)
[facility] PROGRESS NOTE  Erik [name] EUM:[id number] DOB: 12-29-1960 DOA: 03/23/2018 PCP: [name], MD  Brief summary   57 year old male with a history of COPD on 2 L oxygen, lung cancer, presented to [facility] with shortness of breath and fevers.  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  He was transferred to [facility] for further treatments.  On intravenous antibiotics.  Cardiothoracic surgery following. May need chest tube vs. VATS.  He is chronically on anticoagulation for pulmonary embolus diagnosed in 12/2017.  Currently anticoagulated with IV heparin that can be discontinued prior to any procedure.   Assessment/Plan:  1. Acute on chronic respiratory failure with hypoxia.  Patient is chronically on 2 L of oxygen.  Currently requiring 4 L of oxygen.  Related to underlying pneumonia/empyema. Cont antibiotics, bronchodilators, oxygen. Awaiting ct surgery eval  2. Right lung empyema.  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  cont intravenous antibiotics.  Respiratory cultures unrevealing. Blood cultures show no growth.  CVTS following. Dr. Roxan Hockey to see today. May need chest tube vs. VATS 3. Adenocarcinoma of the right lung.  Patient is currently undergoing chemotherapy/immunotherapy every 3 weeks.  He completed radiation therapy approximately a year ago. 4. COPD.  Has mild wheeze bilaterally, does not appear to be significantly short of breath.  Continue bronchodilators scheduled/as needed. 5. Previous history of pulmonary embolism.  Patient was diagnosed with a small pulmonary embolus in 12/2017 and has been on anticoagulation.  Last dose of Eliquis was morning of 4/20.  This has been discontinued and he is currently on IV heparin, which can be discontinued prior to any surgical procedures 6. Hypertension.  Continue Norvasc.  Blood pressure stable. 7. Chronic pain syndrome.  Continue on methadone.    Code Status:  full Family Communication: d/w patient, RN (indicate person spoken with, relationship, and if by phone, the number) Disposition Plan: remains inpatient    Consultants:  CT surgery   Procedures:  none  Antibiotics: Anti-infectives (From admission, onward)   Start     Dose/Rate Route Frequency Ordered Stop   03/27/18 0600  vancomycin (VANCOCIN) IVPB 1000 mg/200 mL premix     1,000 mg 200 mL/hr over 60 Minutes Intravenous Every 8 hours 03/27/18 0105     03/26/18 0000  vancomycin (VANCOCIN) IVPB 750 mg/150 ml premix  Status:  Discontinued     750 mg 150 mL/hr over 60 Minutes Intravenous Every 8 hours 03/25/18 1504 03/27/18 0105   03/24/18 0600  vancomycin (VANCOCIN) IVPB 1000 mg/200 mL premix  Status:  Discontinued     1,000 mg 200 mL/hr over 60 Minutes Intravenous Every 8 hours 03/23/18 2155 03/25/18 1504   03/24/18 0400  piperacillin-tazobactam (ZOSYN) IVPB 3.375 g     3.375 g 12.5 mL/hr over 240 Minutes Intravenous Every 8 hours 03/23/18 2152     03/23/18 2130  vancomycin (VANCOCIN) IVPB 1000 mg/200 mL premix  Status:  Discontinued     1,000 mg 200 mL/hr over 60 Minutes Intravenous  Once 03/23/18 2058 03/25/18 1503   03/23/18 2015  piperacillin-tazobactam (ZOSYN) IVPB 3.375 g     3.375 g 100 mL/hr over 30 Minutes Intravenous  Once 03/23/18 2010 03/23/18 2049   03/23/18 2015  vancomycin (VANCOCIN) IVPB 1000 mg/200 mL premix     1,000 mg 200 mL/hr over 60 Minutes Intravenous  Once 03/23/18 2010 03/23/18 2124        (indicate start date, and stop date if known)  HPI/Subjective: Reports dyspnea. Few  wheezing. No acute chest pains. Will schedule bronchodilators. Awaiting CT surgery eval    Objective: Vitals:   03/27/18 0121 03/27/18 0819  BP: 119/79   Pulse: (!) 101   Resp:    Temp: 97.6 F (36.4 C)   SpO2: 92% 93%    Intake/Output Summary (Last 24 hours) at 03/27/2018 0851 Last data filed at 03/27/2018 0653 Gross per 24 hour  Intake 840.05 ml  Output 850 ml  Net  -9.95 ml   Filed Weights   03/25/18 0500 03/26/18 0500 03/27/18 0500  Weight: 94.3 kg (207 lb 14.3 oz) 96.3 kg (212 lb 4.9 oz) 96.3 kg (212 lb 4.8 oz)    Exam:   General:  No distress   Cardiovascular: s1,s2 rrr  Respiratory: diminished LL, few wheezing   Abdomen: NT, ND  Musculoskeletal:  No leg edema   Data Reviewed: Basic Metabolic Panel: Recent Labs  Lab 03/23/18 1922 03/24/18 0528 03/27/18 0620  NA 134* 136 137  K 3.7 3.5 3.6  CL 92* 97* 99*  CO2 28 29 32  GLUCOSE 137* 91 88  BUN 5* 5* <5*  CREATININE 0.77 0.58* 0.55*  CALCIUM 8.3* 7.9* 7.9*   Liver Function Tests: Recent Labs  Lab 03/23/18 1922  AST 45*  ALT 35  ALKPHOS 220*  BILITOT 1.2  PROT 7.8  ALBUMIN 2.2*   No results for input(s): LIPASE, AMYLASE in the last 168 hours. No results for input(s): AMMONIA in the last 168 hours. CBC: Recent Labs  Lab 03/23/18 2016 03/24/18 0528 03/26/18 0440 03/27/18 0620  WBC 15.4* 7.6 6.0 5.5  NEUTROABS 13.7* 6.5  --   --   HGB 13.4 11.7* 11.6* 11.7*  HCT 41.4 36.6* 36.8* 37.7*  MCV 91.4 92.2 93.6 95.4  PLT 294 233 216 226   Cardiac Enzymes: No results for input(s): CKTOTAL, CKMB, CKMBINDEX, TROPONINI in the last 168 hours. BNP (last 3 results) Recent Labs    03/06/18 1426  BNP 143.0*    ProBNP (last 3 results) No results for input(s): PROBNP in the last 8760 hours.  CBG: No results for input(s): GLUCAP in the last 168 hours.  Recent Results (from the past 240 hour(s))  Culture, blood (Routine x 2)     Status: None (Preliminary result)   Collection Time: 03/23/18  7:23 PM  Result Value Ref Range Status   Specimen Description RIGHT ANTECUBITAL  Final   Special Requests   Final    BOTTLES DRAWN AEROBIC AND ANAEROBIC Blood Culture adequate volume   Culture   Final    NO GROWTH 4 DAYS Performed at [facility], [address] [place], [place] [address]    Report Status PENDING  Incomplete  Culture, blood (Routine x 2)     Status: None  (Preliminary result)   Collection Time: 03/23/18  7:23 PM  Result Value Ref Range Status   Specimen Description BLOOD RIGHT WRIST  Final   Special Requests   Final    BOTTLES DRAWN AEROBIC AND ANAEROBIC Blood Culture adequate volume   Culture   Final    NO GROWTH 4 DAYS Performed at [facility], [address], [place], [place] [address]    Report Status PENDING  Incomplete  MRSA PCR Screening     Status: None   Collection Time: 03/24/18  3:36 PM  Result Value Ref Range Status   MRSA by PCR NEGATIVE NEGATIVE Final    Comment:        The GeneXpert MRSA Assay (FDA approved  for NASAL specimens only), is one component of a comprehensive MRSA colonization surveillance program. It is not intended to diagnose MRSA infection nor to guide or monitor treatment for MRSA infections. Performed at [facility], [address] [place], [place] [address]   Culture, respiratory (NON-Expectorated)     Status: None   Collection Time: 03/24/18  3:49 PM  Result Value Ref Range Status   Specimen Description   Final    EXPECTORATED SPUTUM Performed at [facility], [address] [place], [place] [address]    Special Requests   Final    NONE Performed at [facility], [address], [place], [place] [address]    Gram Stain   Final    ABUNDANT WBC PRESENT,BOTH PMN AND MONONUCLEAR RARE GRAM POSITIVE COCCI    Culture   Final    FEW Consistent with normal respiratory flora. Performed at [facility], [place] [address], [place], [place] [address]    Report Status 03/26/2018 FINAL  Final     Studies: No results found.  Scheduled Meds: . amLODipine  5 mg Oral Daily  . escitalopram  20 mg Oral Daily  . gabapentin  600 mg Oral BID  . ipratropium-albuterol  3 mL Inhalation BID  . lactose free nutrition  237 mL Oral BID BM  . methadone  20 mg Oral 5 X Daily  . mirtazapine  15 mg Oral QHS  . multivitamin with minerals  1 tablet Oral Daily  . neomycin-bacitracin-polymyxin   Topical BID   . pantoprazole  40 mg Oral BID  . polyvinyl alcohol  2 drop Both Eyes Daily  . potassium chloride SA  20 mEq Oral Daily  . sodium chloride flush  3 mL Intravenous Q12H   Continuous Infusions: . heparin 1,600 Units/hr (03/27/18 0057)  . piperacillin-tazobactam (ZOSYN)  IV 3.375 g (03/27/18 0602)  . vancomycin Stopped (03/27/18 0715)    Principal Problem:   Empyema of lung ([place]) Active Problems:   Depression with anxiety   Adenocarcinoma of right lung (HCC)   COPD (chronic obstructive pulmonary disease) (HCC)   Essential hypertension   Acute on chronic respiratory failure with hypoxia (HCC)   Sepsis due to pneumonia ([place])   History of pulmonary embolism   Empyema lung ([place])    Time spent: >35 minutes     Kinnie Feil  [facility] Pager [phone]. If 7PM-7AM, please contact night-coverage at [email or web address], password [facility] 03/27/2018, 8:51 AM  LOS: 4 days

## 2018-03-27 NOTE — Anesthesia Preprocedure Evaluation (Addendum)
Anesthesia Evaluation  Patient identified by MRN, date of birth, ID band Patient awake    Reviewed: Allergy & Precautions, NPO status , Patient's Chart, lab work & pertinent test results  Airway Mallampati: II  TM Distance: >3 FB Neck ROM: Full    Dental  (+) Dental Advisory Given, Edentulous Upper, Missing   Pulmonary shortness of breath, asthma , pneumonia, COPD, Current Smoker,  Pneumonia with empyema and lung CA and chronic pleural effusion   breath sounds clear to auscultation       Cardiovascular hypertension, Pt. on medications  Rhythm:Regular Rate:Normal  03/2018: Left ventricle: Poor acousitic windows No apical window Overall  LVEF is probably mildly depressed Recomm limited echo with  contrast to see if helps in evaluation. The cavity size was  normal. Wall thickness was increased in a pattern of mild LVH. - Left atrium: The atrium was mildly dilated.   Neuro/Psych Anxiety Depression negative neurological ROS     GI/Hepatic Neg liver ROS, GERD  Medicated,  Endo/Other  negative endocrine ROS  Renal/GU negative Renal ROS     Musculoskeletal  (+) Arthritis ,   Abdominal   Peds  Hematology  (+) anemia ,   Anesthesia Other Findings   Reproductive/Obstetrics                            Lab Results  Component Value Date   WBC 5.8 03/27/2018   HGB 11.4 (L) 03/27/2018   HCT 37.2 (L) 03/27/2018   MCV 96.4 03/27/2018   PLT 239 03/27/2018   Lab Results  Component Value Date   CREATININE 0.70 03/27/2018   BUN 5 (L) 03/27/2018   NA 137 03/27/2018   K 4.0 03/27/2018   CL 98 (L) 03/27/2018   CO2 31 03/27/2018    Anesthesia Physical Anesthesia Plan  ASA: III  Anesthesia Plan: General   Post-op Pain Management:    Induction: Intravenous  PONV Risk Score and Plan: 1 and Ondansetron, Dexamethasone and Treatment may vary due to age or medical condition  Airway Management Planned:  Oral ETT  Additional Equipment: Arterial line  Intra-op Plan:   Post-operative Plan: Possible Post-op intubation/ventilation  Informed Consent: I have reviewed the patients History and Physical, chart, labs and discussed the procedure including the risks, benefits and alternatives for the proposed anesthesia with the patient or authorized representative who has indicated his/her understanding and acceptance.   Dental advisory given  Plan Discussed with: CRNA  Anesthesia Plan Comments:        Anesthesia Quick Evaluation

## 2018-03-27 NOTE — Progress Notes (Signed)
[place] for Heparin while Eliquis on hold Indication: pulmonary embolus history  Allergies  Allergen Reactions  . Demeclocycline Other (See Comments) and Swelling  . Tetracyclines & Related Swelling and Other (See Comments)         Patient Measurements: Height: 6' (182.9 cm) Weight: 212 lb 4.8 oz (96.3 kg) IBW/kg (Calculated) : 77.6  Vital Signs: Temp: 97.6 F (36.4 C) (04/22 0121) Temp Source: Oral (04/22 0121) BP: 119/79 (04/22 0121) Pulse Rate: 101 (04/22 0121)  Labs: Recent Labs    03/26/18 0440 03/26/18 1230 03/26/18 2110 03/26/18 2111 03/27/18 0620  HGB 11.6*  --   --   --  11.7*  HCT 36.8*  --   --   --  37.7*  PLT 216  --   --   --  226  APTT 62* 53*  --  93* 91*  HEPARINUNFRC 0.39 0.21* 0.18*  --  0.26*  CREATININE  --   --   --   --  0.55*    Estimated Creatinine Clearance: 125.6 mL/min (A) (by C-G formula based on SCr of 0.55 mg/dL (L)).   Medical History: Past Medical History:  Diagnosis Date  . Arnold-Chiari syndrome ([place])   . Arthritis   . Asthma   . Chronic back pain   . COPD (chronic obstructive pulmonary disease) ([place])   . Depression   . Dyspnea    with exertion   . GERD (gastroesophageal reflux disease)   . Hypertension   . Pneumonia   . Pulmonary embolus ([place])   . Seasonal allergies   . Spinal stenosis of lumbar region   . 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 Wheezing     Assessment: 57 year old male on Eliquis PTA for hx of PE. Holding for possible surgery Monday. Heparin level low at 0.26 but aPTT was therapeutic at 91. Hgb 11.7, plts wnl.  Goal of Therapy:  Heparin level 0.3-0.7 units/ml Monitor platelets by anticoagulation protocol: Yes  aPTT = 66 to 102 seconds   Plan:  Continue heparin drip at 1,600 units/hr Monitor daily aPTT and heparin level, CBC, s/s of bleed  Elenor Quinones, PharmD,  [facility] Clinical Pharmacist Pager [phone] 03/27/2018 8:06  AM

## 2018-03-27 NOTE — Progress Notes (Signed)
Pharmacy Antibiotic Note  Erik [name] is a 57 y.o. male admitted on 03/23/2018 with empyema/pneumonia.  Pharmacy has been consulted for Vancocin and Zosyn dosing.  Plan: Change vancomycin to 1000mg  IV every 8 hours for calculated trough ~15.  Goal trough 15-20 mcg/mL. Continue Zosyn 3.375g IV every 8 hours (4-hour infusion).  Height: 6' (182.9 cm) Weight: 212 lb 4.9 oz (96.3 kg) IBW/kg (Calculated) : 77.6  Temp (24hrs), Avg:97.6 F (36.4 C), Min:97.5 F (36.4 C), Max:97.7 F (36.5 C)  Recent Labs  Lab 03/23/18 1922 03/23/18 1944 03/23/18 2016 03/23/18 2250 03/23/18 2317 03/24/18 0204 03/24/18 0528 03/25/18 1319 03/26/18 0440 03/26/18 2330  WBC  --   --  15.4*  --   --   --  7.6  --  6.0  --   CREATININE 0.77  --   --   --   --   --  0.58*  --   --   --   LATICACIDVEN  --  2.68*  --  1.24 0.8 1.0  --   --   --   --   VANCOTROUGH  --   --   --   --   --   --   --  22*  --  11*    Estimated Creatinine Clearance: 125.6 mL/min (A) (by C-G formula based on SCr of 0.58 mg/dL (L)).    Allergies  Allergen Reactions  . Demeclocycline Other (See Comments) and Swelling  . Tetracyclines & Related Swelling and Other (See Comments)         Antimicrobials this admission: Vanc 4/18>> Zosyn 4/18>>  Dose adjustments this admission: Vanc 750 Q8 > trough 11 > vanc 1g Q8  Microbiology results: 4/18BCx: ngtd MRSA PCR neg  Thank you for allowing pharmacy to be a part of this patient's care.  Rogue Bussing 03/27/2018 1:08 AM

## 2018-03-27 NOTE — Care Management Important Message (Signed)
Important Message  Patient Details  Name: Erik [name] MRN: [id number] Date of Birth: 1961-03-10   Medicare Important Message Given:  Yes    [name] 03/27/2018, 1:24 PM

## 2018-03-28 ENCOUNTER — Inpatient Hospital Stay (HOSPITAL_COMMUNITY): Payer: Medicare Other | Admitting: Anesthesiology

## 2018-03-28 ENCOUNTER — Inpatient Hospital Stay (HOSPITAL_COMMUNITY): Payer: Medicare Other

## 2018-03-28 ENCOUNTER — Encounter (HOSPITAL_COMMUNITY)
Admission: EM | Disposition: A | Discharge status: Home / Self Care | Payer: Self-pay | Source: Home / Self Care | Attending: Internal Medicine

## 2018-03-28 HISTORY — PX: DECORTICATION: SHX5101

## 2018-03-28 HISTORY — PX: VIDEO ASSISTED THORACOSCOPY (VATS)/EMPYEMA: SHX6172

## 2018-03-28 LAB — APTT: APTT: 39 s — AB (ref 24–36)

## 2018-03-28 LAB — CBC
HCT: 39.1 % (ref 39.0–52.0)
HEMOGLOBIN: 12 g/dL — AB (ref 13.0–17.0)
MCH: 29.7 pg (ref 26.0–34.0)
MCHC: 30.7 g/dL (ref 30.0–36.0)
MCV: 96.8 fL (ref 78.0–100.0)
PLATELETS: 257 10*3/uL (ref 150–400)
RBC: 4.04 MIL/uL — AB (ref 4.22–5.81)
RDW: 16.3 % — ABNORMAL HIGH (ref 11.5–15.5)
WBC: 5.4 10*3/uL (ref 4.0–10.5)

## 2018-03-28 LAB — CULTURE, BLOOD (ROUTINE X 2)
Culture: NO GROWTH
Culture: NO GROWTH
Special Requests: ADEQUATE
Special Requests: ADEQUATE

## 2018-03-28 LAB — HEPARIN LEVEL (UNFRACTIONATED)

## 2018-03-28 LAB — GLUCOSE, CAPILLARY
GLUCOSE-CAPILLARY: 144 mg/dL — AB (ref 65–99)
Glucose-Capillary: 130 mg/dL — ABNORMAL HIGH (ref 65–99)

## 2018-03-28 LAB — PREPARE RBC (CROSSMATCH)

## 2018-03-28 SURGERY — VIDEO ASSISTED THORACOSCOPY (VATS)/EMPYEMA
Anesthesia: General | Site: Chest | Laterality: Right

## 2018-03-28 MED ORDER — HYDROMORPHONE HCL 2 MG/ML IJ SOLN
0.2500 mg | INTRAMUSCULAR | Status: DC | PRN
  Administered 2018-03-28 (×2): 1 mg via INTRAVENOUS

## 2018-03-28 MED ORDER — LACTATED RINGERS IV SOLN
INTRAVENOUS | Status: DC | PRN
  Administered 2018-03-28: 07:00:00 via INTRAVENOUS

## 2018-03-28 MED ORDER — FENTANYL 40 MCG/ML IV SOLN
INTRAVENOUS | Status: DC
  Administered 2018-03-28: 1000 ug via INTRAVENOUS
  Filled 2018-03-28: qty 25

## 2018-03-28 MED ORDER — SODIUM CHLORIDE 0.9 % IJ SOLN: INTRAMUSCULAR | Status: DC | PRN

## 2018-03-28 MED ORDER — MIDAZOLAM HCL 2 MG/2ML IJ SOLN
INTRAMUSCULAR | Status: AC
  Filled 2018-03-28: qty 2

## 2018-03-28 MED ORDER — LIDOCAINE 2% (20 MG/ML) 5 ML SYRINGE
INTRAMUSCULAR | Status: AC
  Filled 2018-03-28: qty 5

## 2018-03-28 MED ORDER — IPRATROPIUM-ALBUTEROL 0.5-2.5 (3) MG/3ML IN SOLN
3.0000 mL | Freq: Four times a day (QID) | RESPIRATORY_TRACT | Status: DC | PRN
  Administered 2018-03-28: 3 mL via RESPIRATORY_TRACT

## 2018-03-28 MED ORDER — POTASSIUM CHLORIDE 10 MEQ/50ML IV SOLN
10.0000 meq | Freq: Every day | INTRAVENOUS | Status: DC | PRN
  Administered 2018-03-31: 10 meq via INTRAVENOUS
  Filled 2018-03-28 (×2): qty 50

## 2018-03-28 MED ORDER — HYDROMORPHONE HCL 2 MG/ML IJ SOLN
INTRAMUSCULAR | Status: AC
  Filled 2018-03-28: qty 1

## 2018-03-28 MED ORDER — FENTANYL CITRATE (PF) 250 MCG/5ML IJ SOLN
INTRAMUSCULAR | Status: AC
  Filled 2018-03-28: qty 5

## 2018-03-28 MED ORDER — ROCURONIUM BROMIDE 100 MG/10ML IV SOLN
INTRAVENOUS | Status: DC | PRN
  Administered 2018-03-28 (×2): 20 mg via INTRAVENOUS
  Administered 2018-03-28: 30 mg via INTRAVENOUS
  Administered 2018-03-28: 50 mg via INTRAVENOUS
  Administered 2018-03-28: 30 mg via INTRAVENOUS
  Administered 2018-03-28 (×3): 50 mg via INTRAVENOUS
  Administered 2018-03-28: 20 mg via INTRAVENOUS

## 2018-03-28 MED ORDER — METOPROLOL TARTRATE 5 MG/5ML IV SOLN
1.0000 mg | Freq: Once | INTRAVENOUS | Status: AC
  Administered 2018-03-28: 1 mg via INTRAVENOUS

## 2018-03-28 MED ORDER — ONDANSETRON HCL 4 MG/2ML IJ SOLN
INTRAMUSCULAR | Status: AC
  Filled 2018-03-28: qty 2

## 2018-03-28 MED ORDER — ONDANSETRON HCL 4 MG/2ML IJ SOLN
INTRAMUSCULAR | Status: DC | PRN
  Administered 2018-03-28: 4 mg via INTRAVENOUS

## 2018-03-28 MED ORDER — INSULIN ASPART 100 UNIT/ML ~~LOC~~ SOLN
0.0000 [IU] | SUBCUTANEOUS | Status: DC
  Administered 2018-03-28 – 2018-03-29 (×7): 2 [IU] via SUBCUTANEOUS

## 2018-03-28 MED ORDER — SODIUM CHLORIDE 0.9 % IV SOLN
INTRAVENOUS | Status: DC
  Administered 2018-03-28 – 2018-03-29 (×2): via INTRAVENOUS

## 2018-03-28 MED ORDER — KETAMINE HCL 10 MG/ML IJ SOLN
INTRAMUSCULAR | Status: AC
  Filled 2018-03-28: qty 1

## 2018-03-28 MED ORDER — SODIUM CHLORIDE 0.9 % IV SOLN: Freq: Once | INTRAVENOUS | Status: DC

## 2018-03-28 MED ORDER — ONDANSETRON HCL 4 MG/2ML IJ SOLN
4.0000 mg | Freq: Four times a day (QID) | INTRAMUSCULAR | Status: DC | PRN
  Administered 2018-03-28: 4 mg via INTRAVENOUS
  Filled 2018-03-28: qty 2

## 2018-03-28 MED ORDER — KETAMINE HCL 10 MG/ML IJ SOLN
INTRAMUSCULAR | Status: DC | PRN
  Administered 2018-03-28: 10 mg via INTRAVENOUS
  Administered 2018-03-28: 30 mg via INTRAVENOUS
  Administered 2018-03-28: 10 mg via INTRAVENOUS

## 2018-03-28 MED ORDER — SUGAMMADEX SODIUM 500 MG/5ML IV SOLN
INTRAVENOUS | Status: DC | PRN
  Administered 2018-03-28: 300 mg via INTRAVENOUS

## 2018-03-28 MED ORDER — MIDAZOLAM HCL 5 MG/5ML IJ SOLN
INTRAMUSCULAR | Status: DC | PRN
  Administered 2018-03-28 (×2): 1 mg via INTRAVENOUS

## 2018-03-28 MED ORDER — FENTANYL CITRATE (PF) 100 MCG/2ML IJ SOLN
INTRAMUSCULAR | Status: AC
  Filled 2018-03-28: qty 2

## 2018-03-28 MED ORDER — IPRATROPIUM-ALBUTEROL 0.5-2.5 (3) MG/3ML IN SOLN
RESPIRATORY_TRACT | Status: AC
Start: 2018-03-28 — End: 2018-03-29
  Filled 2018-03-28: qty 6

## 2018-03-28 MED ORDER — DEXMEDETOMIDINE HCL 200 MCG/2ML IV SOLN
INTRAVENOUS | Status: DC | PRN
  Administered 2018-03-28: 8 ug via INTRAVENOUS
  Administered 2018-03-28: 4 ug via INTRAVENOUS
  Administered 2018-03-28 (×2): 8 ug via INTRAVENOUS

## 2018-03-28 MED ORDER — DIPHENHYDRAMINE HCL 50 MG/ML IJ SOLN: 12.5000 mg | Freq: Four times a day (QID) | INTRAMUSCULAR | Status: DC | PRN

## 2018-03-28 MED ORDER — ESMOLOL HCL 100 MG/10ML IV SOLN
INTRAVENOUS | Status: AC
Start: 2018-03-28 — End: 2018-03-28
  Filled 2018-03-28: qty 10

## 2018-03-28 MED ORDER — NALOXONE HCL 0.4 MG/ML IJ SOLN: 0.4000 mg | INTRAMUSCULAR | Status: DC | PRN

## 2018-03-28 MED ORDER — PROPOFOL 10 MG/ML IV BOLUS
INTRAVENOUS | Status: AC
  Filled 2018-03-28: qty 20

## 2018-03-28 MED ORDER — TRAMADOL HCL 50 MG PO TABS: 50.0000 mg | ORAL_TABLET | Freq: Four times a day (QID) | ORAL | Status: DC | PRN

## 2018-03-28 MED ORDER — BUPIVACAINE LIPOSOME 1.3 % IJ SUSP
20.0000 mL | INTRAMUSCULAR | Status: DC
  Filled 2018-03-28 (×2): qty 20

## 2018-03-28 MED ORDER — ALPRAZOLAM 0.5 MG PO TABS
1.0000 mg | ORAL_TABLET | Freq: Two times a day (BID) | ORAL | Status: DC | PRN
  Administered 2018-03-29 – 2018-04-07 (×13): 1 mg via ORAL
  Filled 2018-03-28 (×13): qty 2

## 2018-03-28 MED ORDER — SUGAMMADEX SODIUM 500 MG/5ML IV SOLN
INTRAVENOUS | Status: AC
  Filled 2018-03-28: qty 5

## 2018-03-28 MED ORDER — 0.9 % SODIUM CHLORIDE (POUR BTL) OPTIME
TOPICAL | Status: DC | PRN
  Administered 2018-03-28: 2000 mL

## 2018-03-28 MED ORDER — ESCITALOPRAM OXALATE 10 MG PO TABS
20.0000 mg | ORAL_TABLET | Freq: Every day | ORAL | Status: DC
  Administered 2018-03-29 – 2018-04-08 (×11): 20 mg via ORAL
  Filled 2018-03-28: qty 2
  Filled 2018-03-28: qty 1
  Filled 2018-03-28 (×2): qty 2
  Filled 2018-03-28: qty 1
  Filled 2018-03-28 (×8): qty 2

## 2018-03-28 MED ORDER — FENTANYL 40 MCG/ML IV SOLN
INTRAVENOUS | Status: DC
  Administered 2018-03-28: 30 ug via INTRAVENOUS
  Administered 2018-03-28: 90 ug via INTRAVENOUS
  Administered 2018-03-28: 150 ug via INTRAVENOUS
  Administered 2018-03-29: 165 ug via INTRAVENOUS
  Administered 2018-03-29 – 2018-03-30 (×3): 1000 ug via INTRAVENOUS
  Administered 2018-03-30: 135 ug via INTRAVENOUS
  Administered 2018-03-30: 60 ug via INTRAVENOUS
  Administered 2018-03-30: 195 ug via INTRAVENOUS
  Administered 2018-03-31: 135 ug via INTRAVENOUS
  Administered 2018-03-31: 75 ug via INTRAVENOUS
  Filled 2018-03-28: qty 25
  Filled 2018-03-28: qty 1000

## 2018-03-28 MED ORDER — ESMOLOL HCL 100 MG/10ML IV SOLN
INTRAVENOUS | Status: DC | PRN
  Administered 2018-03-28: 10 mg via INTRAVENOUS
  Administered 2018-03-28: 5 mg via INTRAVENOUS
  Administered 2018-03-28: 10 mg via INTRAVENOUS

## 2018-03-28 MED ORDER — OXYCODONE HCL 5 MG PO TABS
5.0000 mg | ORAL_TABLET | ORAL | Status: DC | PRN
  Administered 2018-03-28 – 2018-03-29 (×3): 10 mg via ORAL
  Administered 2018-03-29: 5 mg via ORAL
  Administered 2018-03-30 – 2018-04-03 (×8): 10 mg via ORAL
  Filled 2018-03-28 (×11): qty 2

## 2018-03-28 MED ORDER — ACETAMINOPHEN 160 MG/5ML PO SOLN: 1000.0000 mg | Freq: Four times a day (QID) | ORAL | Status: AC

## 2018-03-28 MED ORDER — SODIUM CHLORIDE 0.9% FLUSH: 9.0000 mL | INTRAVENOUS | Status: DC | PRN

## 2018-03-28 MED ORDER — ENOXAPARIN SODIUM 40 MG/0.4ML ~~LOC~~ SOLN
40.0000 mg | Freq: Every day | SUBCUTANEOUS | Status: DC
  Administered 2018-03-29 – 2018-03-30 (×2): 40 mg via SUBCUTANEOUS
  Filled 2018-03-28 (×2): qty 0.4

## 2018-03-28 MED ORDER — DIPHENHYDRAMINE HCL 12.5 MG/5ML PO ELIX
12.5000 mg | ORAL_SOLUTION | Freq: Four times a day (QID) | ORAL | Status: DC | PRN
  Filled 2018-03-28: qty 5

## 2018-03-28 MED ORDER — OXYCODONE HCL 5 MG PO TABS
ORAL_TABLET | ORAL | Status: AC
  Filled 2018-03-28: qty 2

## 2018-03-28 MED ORDER — FENTANYL CITRATE (PF) 100 MCG/2ML IJ SOLN
25.0000 ug | INTRAMUSCULAR | Status: DC | PRN
  Administered 2018-03-28: 50 ug via INTRAVENOUS

## 2018-03-28 MED ORDER — HEMOSTATIC AGENTS (NO CHARGE) OPTIME
TOPICAL | Status: DC | PRN
Start: 2018-03-28 — End: 2018-03-28
  Administered 2018-03-28: 1 via TOPICAL

## 2018-03-28 MED ORDER — FENTANYL CITRATE (PF) 100 MCG/2ML IJ SOLN
INTRAMUSCULAR | Status: DC | PRN
  Administered 2018-03-28: 25 ug via INTRAVENOUS
  Administered 2018-03-28: 100 ug via INTRAVENOUS
  Administered 2018-03-28 (×2): 50 ug via INTRAVENOUS
  Administered 2018-03-28: 75 ug via INTRAVENOUS
  Administered 2018-03-28 (×3): 50 ug via INTRAVENOUS

## 2018-03-28 MED ORDER — PROPOFOL 10 MG/ML IV BOLUS
INTRAVENOUS | Status: DC | PRN
  Administered 2018-03-28: 160 mg via INTRAVENOUS

## 2018-03-28 MED ORDER — MIRTAZAPINE 7.5 MG PO TABS
15.0000 mg | ORAL_TABLET | Freq: Every day | ORAL | Status: DC
  Administered 2018-03-29 – 2018-04-07 (×10): 15 mg via ORAL
  Filled 2018-03-28 (×2): qty 2
  Filled 2018-03-28: qty 1
  Filled 2018-03-28 (×6): qty 2
  Filled 2018-03-28: qty 1

## 2018-03-28 MED ORDER — METOPROLOL TARTRATE 5 MG/5ML IV SOLN
INTRAVENOUS | Status: AC
  Filled 2018-03-28: qty 5

## 2018-03-28 MED ORDER — SENNOSIDES-DOCUSATE SODIUM 8.6-50 MG PO TABS
1.0000 | ORAL_TABLET | Freq: Every day | ORAL | Status: DC
  Administered 2018-03-28 – 2018-03-29 (×2): 1 via ORAL
  Filled 2018-03-28 (×2): qty 1

## 2018-03-28 MED ORDER — LACTATED RINGERS IV SOLN
INTRAVENOUS | Status: DC | PRN
  Administered 2018-03-28 (×2): via INTRAVENOUS

## 2018-03-28 MED ORDER — BISACODYL 5 MG PO TBEC
10.0000 mg | DELAYED_RELEASE_TABLET | Freq: Every day | ORAL | Status: DC
  Administered 2018-03-29 – 2018-03-30 (×2): 10 mg via ORAL
  Filled 2018-03-28 (×2): qty 2

## 2018-03-28 MED ORDER — GABAPENTIN 300 MG PO CAPS
600.0000 mg | ORAL_CAPSULE | Freq: Two times a day (BID) | ORAL | Status: DC
  Administered 2018-03-29 – 2018-04-08 (×21): 600 mg via ORAL
  Filled 2018-03-28 (×21): qty 2

## 2018-03-28 MED ORDER — ACETAMINOPHEN 500 MG PO TABS
1000.0000 mg | ORAL_TABLET | Freq: Four times a day (QID) | ORAL | Status: AC
  Administered 2018-03-28 – 2018-04-02 (×19): 1000 mg via ORAL
  Filled 2018-03-28 (×19): qty 2

## 2018-03-28 MED ORDER — LIDOCAINE HCL (CARDIAC) PF 100 MG/5ML IV SOSY
PREFILLED_SYRINGE | INTRAVENOUS | Status: DC | PRN
  Administered 2018-03-28: 80 mg via INTRAVENOUS

## 2018-03-28 MED ORDER — DEXTROSE 5 % IV SOLN
INTRAVENOUS | Status: DC | PRN
  Administered 2018-03-28: 20 ug/min via INTRAVENOUS

## 2018-03-28 MED ORDER — PROMETHAZINE HCL 25 MG/ML IJ SOLN: 6.2500 mg | INTRAMUSCULAR | Status: DC | PRN

## 2018-03-28 MED ORDER — ROCURONIUM BROMIDE 10 MG/ML (PF) SYRINGE
PREFILLED_SYRINGE | INTRAVENOUS | Status: AC
  Filled 2018-03-28: qty 15

## 2018-03-28 SURGICAL SUPPLY — 100 items
APPLICATOR COTTON TIP 6 STRL (MISCELLANEOUS) IMPLANT
APPLICATOR COTTON TIP 6IN STRL (MISCELLANEOUS) ×32 IMPLANT
CANISTER SUCT 3000ML PPV (MISCELLANEOUS) ×6 IMPLANT
CATH THORACIC 28FR (CATHETERS) ×2 IMPLANT
CATH THORACIC 36FR (CATHETERS) IMPLANT
CATH THORACIC 36FR RT ANG (CATHETERS) IMPLANT
CLIP VESOCCLUDE MED 6/CT (CLIP) ×2 IMPLANT
CONN ST 1/4X3/8  BEN (MISCELLANEOUS) ×4
CONN ST 1/4X3/8 BEN (MISCELLANEOUS) IMPLANT
CONN Y 3/8X3/8X3/8  BEN (MISCELLANEOUS)
CONN Y 3/8X3/8X3/8 BEN (MISCELLANEOUS) IMPLANT
CONT SPEC 4OZ CLIKSEAL STRL BL (MISCELLANEOUS) ×18 IMPLANT
CUTTER ECHEON FLEX ENDO 45 340 (ENDOMECHANICALS) ×2 IMPLANT
DERMABOND ADVANCED (GAUZE/BANDAGES/DRESSINGS) ×2
DERMABOND ADVANCED .7 DNX12 (GAUZE/BANDAGES/DRESSINGS) IMPLANT
DRAIN CHANNEL 28F RND 3/8 FF (WOUND CARE) IMPLANT
DRAIN CHANNEL 32F RND 10.7 FF (WOUND CARE) ×4 IMPLANT
DRAPE LAPAROSCOPIC ABDOMINAL (DRAPES) ×4 IMPLANT
DRAPE SLUSH/WARMER DISC (DRAPES) ×2 IMPLANT
DRAPE WARM FLUID 44X44 (DRAPE) ×4 IMPLANT
ELECT BLADE 6.5 EXT (BLADE) ×4 IMPLANT
ELECT CAUTERY BLADE 6.4 (BLADE) ×2 IMPLANT
ELECT REM PT RETURN 9FT ADLT (ELECTROSURGICAL) ×4
ELECTRODE REM PT RTRN 9FT ADLT (ELECTROSURGICAL) ×2 IMPLANT
GAUZE SPONGE 4X4 12PLY STRL (GAUZE/BANDAGES/DRESSINGS) ×4 IMPLANT
GAUZE SPONGE 4X4 12PLY STRL LF (GAUZE/BANDAGES/DRESSINGS) ×2 IMPLANT
GLOVE BIO SURGEON STRL SZ 6 (GLOVE) ×6 IMPLANT
GLOVE BIO SURGEON STRL SZ 6.5 (GLOVE) ×2 IMPLANT
GLOVE BIO SURGEONS STRL SZ 6.5 (GLOVE) ×2
GLOVE BIOGEL PI IND STRL 6 (GLOVE) IMPLANT
GLOVE BIOGEL PI IND STRL 6.5 (GLOVE) IMPLANT
GLOVE BIOGEL PI INDICATOR 6 (GLOVE) ×6
GLOVE BIOGEL PI INDICATOR 6.5 (GLOVE) ×6
GLOVE SURG SIGNA 7.5 PF LTX (GLOVE) ×8 IMPLANT
GLOVE SURG SS PI 6.0 STRL IVOR (GLOVE) ×2 IMPLANT
GOWN STRL REUS W/ TWL LRG LVL3 (GOWN DISPOSABLE) ×4 IMPLANT
GOWN STRL REUS W/ TWL XL LVL3 (GOWN DISPOSABLE) ×4 IMPLANT
GOWN STRL REUS W/TWL LRG LVL3 (GOWN DISPOSABLE) ×6
GOWN STRL REUS W/TWL XL LVL3 (GOWN DISPOSABLE) ×4
KIT BASIN OR (CUSTOM PROCEDURE TRAY) ×4 IMPLANT
KIT SUCTION CATH 14FR (SUCTIONS) ×4 IMPLANT
KIT TURNOVER KIT B (KITS) ×4 IMPLANT
NDL HYPO 25GX1X1/2 BEV (NEEDLE) ×2 IMPLANT
NDL SPNL 18GX3.5 QUINCKE PK (NEEDLE) IMPLANT
NDL SPNL 22GX3.5 QUINCKE BK (NEEDLE) ×2 IMPLANT
NEEDLE HYPO 25GX1X1/2 BEV (NEEDLE) ×4 IMPLANT
NEEDLE SPNL 18GX3.5 QUINCKE PK (NEEDLE) IMPLANT
NEEDLE SPNL 22GX3.5 QUINCKE BK (NEEDLE) ×4 IMPLANT
NS IRRIG 1000ML POUR BTL (IV SOLUTION) ×10 IMPLANT
PACK CHEST (CUSTOM PROCEDURE TRAY) ×4 IMPLANT
PAD ARMBOARD 7.5X6 YLW CONV (MISCELLANEOUS) ×8 IMPLANT
PENCIL BUTTON HOLSTER BLD 10FT (ELECTRODE) ×4 IMPLANT
POUCH ENDO CATCH II 15MM (MISCELLANEOUS) IMPLANT
POUCH SPECIMEN RETRIEVAL 10MM (ENDOMECHANICALS) IMPLANT
PROGEL SPRAY TIP 11IN (MISCELLANEOUS) ×4
RELOAD STAPLE 45 GOLD REG/THCK (STAPLE) IMPLANT
SCISSORS LAP 5X35 DISP (ENDOMECHANICALS) ×2 IMPLANT
SEALANT PROGEL (MISCELLANEOUS) ×2 IMPLANT
SEALANT SURG COSEAL 4ML (VASCULAR PRODUCTS) IMPLANT
SEALANT SURG COSEAL 8ML (VASCULAR PRODUCTS) IMPLANT
SOLUTION ANTI FOG 6CC (MISCELLANEOUS) ×6 IMPLANT
SPECIMEN JAR MEDIUM (MISCELLANEOUS) ×2 IMPLANT
SPONGE INTESTINAL PEANUT (DISPOSABLE) ×16 IMPLANT
SPONGE LAP 18X18 X RAY DECT (DISPOSABLE) ×4 IMPLANT
SPONGE TONSIL 1 RF SGL (DISPOSABLE) ×2 IMPLANT
STAPLE RELOAD 45MM GOLD (STAPLE) ×4 IMPLANT
STAPLER VISISTAT 35W (STAPLE) ×2 IMPLANT
SUT PROLENE 4 0 RB 1 (SUTURE)
SUT PROLENE 4-0 RB1 .5 CRCL 36 (SUTURE) IMPLANT
SUT SILK  1 MH (SUTURE) ×6
SUT SILK 1 MH (SUTURE) ×2 IMPLANT
SUT SILK 1 TIES 10X30 (SUTURE) ×2 IMPLANT
SUT SILK 2 0SH CR/8 30 (SUTURE) IMPLANT
SUT SILK 3 0SH CR/8 30 (SUTURE) IMPLANT
SUT VIC AB 0 CTX 27 (SUTURE) IMPLANT
SUT VIC AB 1 CTX 27 (SUTURE) ×2 IMPLANT
SUT VIC AB 2 TP1 27 (SUTURE) ×2 IMPLANT
SUT VIC AB 2-0 CT1 27 (SUTURE) ×4
SUT VIC AB 2-0 CT1 TAPERPNT 27 (SUTURE) IMPLANT
SUT VIC AB 2-0 CTX 36 (SUTURE) IMPLANT
SUT VIC AB 3-0 MH 27 (SUTURE) IMPLANT
SUT VIC AB 3-0 SH 27 (SUTURE)
SUT VIC AB 3-0 SH 27X BRD (SUTURE) IMPLANT
SUT VIC AB 3-0 X1 27 (SUTURE) ×6 IMPLANT
SUT VICRYL 0 UR6 27IN ABS (SUTURE) ×8 IMPLANT
SUT VICRYL 2 TP 1 (SUTURE) ×2 IMPLANT
SWAB COLLECTION DEVICE MRSA (MISCELLANEOUS) IMPLANT
SWAB CULTURE ESWAB REG 1ML (MISCELLANEOUS) IMPLANT
SYR 10ML LL (SYRINGE) IMPLANT
SYR 30ML LL (SYRINGE) ×4 IMPLANT
SYSTEM SAHARA CHEST DRAIN ATS (WOUND CARE) ×4 IMPLANT
TAPE CLOTH SURG 4X10 WHT LF (GAUZE/BANDAGES/DRESSINGS) ×2 IMPLANT
TIP APPLICATOR SPRAY EXTEND 16 (VASCULAR PRODUCTS) ×2 IMPLANT
TIP SPRAY PROGEL 11IN (MISCELLANEOUS) IMPLANT
TOWEL GREEN STERILE (TOWEL DISPOSABLE) ×4 IMPLANT
TOWEL GREEN STERILE FF (TOWEL DISPOSABLE) ×6 IMPLANT
TRAP SPECIMEN MUCOUS 40CC (MISCELLANEOUS) ×4 IMPLANT
TRAY FOLEY W/METER SILVER 16FR (SET/KITS/TRAYS/PACK) ×4 IMPLANT
TROCAR XCEL BLADELESS 5X75MML (TROCAR) ×4 IMPLANT
WATER STERILE IRR 1000ML POUR (IV SOLUTION) ×6 IMPLANT

## 2018-03-28 NOTE — Progress Notes (Signed)
Patient from home, has home oxygen, presents with R lung empyema, CVTS consulted, plan for VATS, he also has hx of PE was on eliquis at home pta.  Discussed in LOS. Per medical director see if patient would be a LTACH candidate after surgery.

## 2018-03-28 NOTE — Progress Notes (Signed)
[facility] PROGRESS NOTE  Erik [name] PNT:[id number] DOB: October 18, 1961 DOA: 03/23/2018 PCP: [name], MD  Brief summary   57 year old male with a history of COPD on 2 L oxygen, lung cancer, presented to [facility] with shortness of breath and fevers.  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  He was transferred to [facility] for further treatments.  On intravenous antibiotics.  Cardiothoracic surgery team proceeded with video-assisted thoracoscopy (VATS) and decortication, indication being right empyema today 03/28/2018.    Patient is chronically on anticoagulation for pulmonary embolus diagnosed in 12/2017.    Patient was on IV heparin prior to surgery.  Heparin was discontinued prior to surgery.  Assessment/Plan:  1. Acute on chronic respiratory failure with hypoxia.  Patient is chronically on 2 L of oxygen.  Currently requiring 4 L of oxygen.  Related to underlying pneumonia/empyema. Cont antibiotics, bronchodilators, oxygen.  Patient seen postop.  Patient remains stable.  2. Right lung empyema.  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  cont intravenous antibiotics.  Respiratory cultures unrevealing. Blood cultures show no growth.  CVTS following. Dr. Roxan Hockey to see today.       -03/28/2018: Patient underwent VATS and right-sided decortication  3. Adenocarcinoma of the right lung.  Patient is currently undergoing chemotherapy/immunotherapy every 3 weeks.  He completed radiation therapy approximately a year ago.  4. COPD.          -Stable.  Previous history of pulmonary embolism.  Patient was diagnosed with a small pulmonary embolus in 12/2017 and has been on anticoagulation.  Last dose of Eliquis was morning of 4/20.    Eliquis was discontinued, and patient started on heparin as patient was going to going for surgery.  Heparin was discontinued prior to surgery.  Kindly liaise with the cardiothoracic surgery team and decide  when to restart anticoagulation.    5. Hypertension.  Continue Norvasc.  Blood pressure stable.  6. Chronic pain syndrome.    Patient was on methadone prior to surgery.    Code Status: full Family Communication: d Disposition Plan: remains inpatient    Consultants:  CT surgery   Procedures: VATS right-sided decortication (right-sided empyema)  Antibiotics: Anti-infectives (From admission, onward)   Start     Dose/Rate Route Frequency Ordered Stop   03/27/18 0600  vancomycin (VANCOCIN) IVPB 1000 mg/200 mL premix     1,000 mg 200 mL/hr over 60 Minutes Intravenous Every 8 hours 03/27/18 0105     03/26/18 0000  vancomycin (VANCOCIN) IVPB 750 mg/150 ml premix  Status:  Discontinued     750 mg 150 mL/hr over 60 Minutes Intravenous Every 8 hours 03/25/18 1504 03/27/18 0105   03/24/18 0600  vancomycin (VANCOCIN) IVPB 1000 mg/200 mL premix  Status:  Discontinued     1,000 mg 200 mL/hr over 60 Minutes Intravenous Every 8 hours 03/23/18 2155 03/25/18 1504   03/24/18 0400  piperacillin-tazobactam (ZOSYN) IVPB 3.375 g     3.375 g 12.5 mL/hr over 240 Minutes Intravenous Every 8 hours 03/23/18 2152     03/23/18 2130  vancomycin (VANCOCIN) IVPB 1000 mg/200 mL premix  Status:  Discontinued     1,000 mg 200 mL/hr over 60 Minutes Intravenous  Once 03/23/18 2058 03/25/18 1503   03/23/18 2015  piperacillin-tazobactam (ZOSYN) IVPB 3.375 g     3.375 g 100 mL/hr over 30 Minutes Intravenous  Once 03/23/18 2010 03/23/18 2049   03/23/18 2015  vancomycin (VANCOCIN) IVPB 1000 mg/200 mL premix  1,000 mg 200 mL/hr over 60 Minutes Intravenous  Once 03/23/18 2010 03/23/18 2124       (indicate start date, and stop date if known)  HPI/Subjective: Reports dyspnea. Few wheezing. No acute chest pains. Will schedule bronchodilators. Awaiting CT surgery eval    Objective: Vitals:   03/28/18 1447 03/28/18 1500  BP:  123/87  Pulse:    Resp: (!) 23 (!) 24  Temp: 97.9 F (36.6 C) 97.8 F (36.6 C)   SpO2:  91%    Intake/Output Summary (Last 24 hours) at 03/28/2018 1537 Last data filed at 03/28/2018 1500 Gross per 24 hour  Intake 2640.93 ml  Output 2120 ml  Net 520.93 ml   Filed Weights   03/25/18 0500 03/26/18 0500 03/27/18 0500  Weight: 94.3 kg (207 lb 14.3 oz) 96.3 kg (212 lb 4.9 oz) 96.3 kg (212 lb 4.8 oz)    Exam:   General: Sleepy.  Apparently, patient was given pain medication postop.  Cardiovascular: s1,s2 rrr  Respiratory: Decreased air entry at right lung base posteriorly, with expiratory wheeze.  D   Abdomen: NT, ND  Musculoskeletal:  No leg edema   Data Reviewed: Basic Metabolic Panel: Recent Labs  Lab 03/23/18 1922 03/24/18 0528 03/27/18 0620 03/27/18 1848  NA 134* 136 137 137  K 3.7 3.5 3.6 4.0  CL 92* 97* 99* 98*  CO2 28 29 32 31  GLUCOSE 137* 91 88 130*  BUN 5* 5* <5* 5*  CREATININE 0.77 0.58* 0.55* 0.70  CALCIUM 8.3* 7.9* 7.9* 8.0*   Liver Function Tests: Recent Labs  Lab 03/23/18 1922 03/27/18 1848  AST 45* 49*  ALT 35 28  ALKPHOS 220* 216*  BILITOT 1.2 0.4  PROT 7.8 6.6  ALBUMIN 2.2* 1.7*   No results for input(s): LIPASE, AMYLASE in the last 168 hours. No results for input(s): AMMONIA in the last 168 hours. CBC: Recent Labs  Lab 03/23/18 2016 03/24/18 0528 03/26/18 0440 03/27/18 0620 03/27/18 1848 03/28/18 0612  WBC 15.4* 7.6 6.0 5.5 5.8 5.4  NEUTROABS 13.7* 6.5  --   --   --   --   HGB 13.4 11.7* 11.6* 11.7* 11.4* 12.0*  HCT 41.4 36.6* 36.8* 37.7* 37.2* 39.1  MCV 91.4 92.2 93.6 95.4 96.4 96.8  PLT 294 233 216 226 239 257   Cardiac Enzymes: No results for input(s): CKTOTAL, CKMB, CKMBINDEX, TROPONINI in the last 168 hours. BNP (last 3 results) Recent Labs    03/06/18 1426  BNP 143.0*    ProBNP (last 3 results) No results for input(s): PROBNP in the last 8760 hours.  CBG: No results for input(s): GLUCAP in the last 168 hours.  Recent Results (from the past 240 hour(s))  Culture, blood (Routine x 2)      Status: None   Collection Time: 03/23/18  7:23 PM  Result Value Ref Range Status   Specimen Description RIGHT ANTECUBITAL  Final   Special Requests   Final    BOTTLES DRAWN AEROBIC AND ANAEROBIC Blood Culture adequate volume   Culture   Final    NO GROWTH 5 DAYS Performed at [facility], [address], [place], [place] [address]    Report Status 03/28/2018 FINAL  Final  Culture, blood (Routine x 2)     Status: None   Collection Time: 03/23/18  7:23 PM  Result Value Ref Range Status   Specimen Description BLOOD RIGHT WRIST  Final   Special Requests   Final    BOTTLES DRAWN AEROBIC AND  ANAEROBIC Blood Culture adequate volume   Culture   Final    NO GROWTH 5 DAYS Performed at [facility], [address] [place], [place] [address]    Report Status 03/28/2018 FINAL  Final  MRSA PCR Screening     Status: None   Collection Time: 03/24/18  3:36 PM  Result Value Ref Range Status   MRSA by PCR NEGATIVE NEGATIVE Final    Comment:        The GeneXpert MRSA Assay (FDA approved for NASAL specimens only), is one component of a comprehensive MRSA colonization surveillance program. It is not intended to diagnose MRSA infection nor to guide or monitor treatment for MRSA infections. Performed at [facility], [address], [place], [place] [address]   Culture, respiratory (NON-Expectorated)     Status: None   Collection Time: 03/24/18  3:49 PM  Result Value Ref Range Status   Specimen Description   Final    EXPECTORATED SPUTUM Performed at [facility], [address] [place], [place] [address]    Special Requests   Final    NONE Performed at [facility], [address], [place], [place] [address]    Gram Stain   Final    ABUNDANT WBC PRESENT,BOTH PMN AND MONONUCLEAR RARE GRAM POSITIVE COCCI    Culture   Final    FEW Consistent with normal respiratory flora. Performed at [facility], [place] [address] [place], [place] [address]    Report Status 03/26/2018 FINAL  Final      Studies: Dg Chest Portable 1 View  Result Date: 03/28/2018 CLINICAL DATA:  Check for retained instruments or surgical objects EXAM: PORTABLE CHEST 1 VIEW COMPARISON:  Preoperative chest x-ray of March 23, 2017 FINDINGS: Patient has apparently undergone right sided surgery surgery for drainage of a large empyema. There are chest tubes present. A small amount of pleural space air is noted inferiorly. There is shift of the mediastinum toward the right. The left lung is well-expanded. There is infiltrate or atelectasis at the left lung base. No abnormal surgical instruments are retained. A power port is present is tip projects over the cavoatrial junction. There is a left internal jugular venous catheter tip projects over the proximal SVC. IMPRESSION: No retained metallic surgical instruments are observed. There are postsurgical changes in the right hemithorax. The 2 right-sided chest tubes as well as the power port catheter and the left internal jugular venous catheter are in position as described. There is some pleural space air in the inferior aspect of the right hemithorax likely introduced during drainage of the suspected empyema. New increased density at the left lung base which may reflect atelectasis or developing pneumonia. This report was called to OR 17's nurse, Crystal, at 1:03 p.m. on 28 March 2018. Electronically Signed   By: David  Martinique M.D.   On: 03/28/2018 13:06    Scheduled Meds: . acetaminophen  1,000 mg Oral Q6H   Or  . acetaminophen (TYLENOL) oral liquid 160 mg/5 mL  1,000 mg Oral Q6H  . bisacodyl  10 mg Oral Daily  . [START ON 03/29/2018] enoxaparin (LOVENOX) injection  40 mg Subcutaneous Daily  . [START ON 03/29/2018] escitalopram  20 mg Oral Daily  . fentaNYL      . fentaNYL   Intravenous Q4H  . [START ON 03/29/2018] gabapentin  600 mg Oral BID  . HYDROmorphone      . insulin aspart  0-24 Units Subcutaneous Q4H  . ipratropium-albuterol      .  metoprolol tartrate      .  [START ON 03/29/2018] mirtazapine  15 mg Oral QHS  . oxyCODONE      . polyvinyl alcohol  2 drop Both Eyes Daily  . senna-docusate  1 tablet Oral QHS   Continuous Infusions: . sodium chloride 100 mL/hr at 03/28/18 1520  . piperacillin-tazobactam (ZOSYN)  IV 3.375 g (03/28/18 0540)  . potassium chloride    . vancomycin 1,000 mg (03/28/18 0540)    Principal Problem:   Empyema of lung ([place]) Active Problems:   Depression with anxiety   Adenocarcinoma of right lung (HCC)   COPD (chronic obstructive pulmonary disease) ([place])   Essential hypertension   Acute on chronic respiratory failure with hypoxia (HCC)   Sepsis due to pneumonia ([place])   History of pulmonary embolism   Empyema lung ([place])    Time spent: >35 minutes     [facility]  [facility] Pager [phone]. If 7PM-7AM, please contact night-coverage at [email or web address], password [facility] 03/28/2018, 3:37 PM  LOS: 5 days

## 2018-03-28 NOTE — OR Nursing (Signed)
[id number] Radiology called with results of chest X-Ray. No instrumentation left behind per Dr. Doug Sou reading.

## 2018-03-28 NOTE — Anesthesia Procedure Notes (Addendum)
Central Venous Catheter Insertion Performed by: Belinda Block, MD, anesthesiologist Start/End4/23/2019 6:50 AM, 03/28/2018 7:10 AM Preanesthetic checklist: patient identified, IV checked, site marked, risks and benefits discussed, surgical consent, monitors and equipment checked, pre-op evaluation and timeout performed Position: Trendelenburg Lidocaine 1% used for infiltration and patient sedated Hand hygiene performed , maximum sterile barriers used  and Seldinger technique used Central line was placed.Double lumen Procedure performed using ultrasound guided technique. Ultrasound Notes:anatomy identified Attempts: 2 Following insertion, line sutured, dressing applied and Biopatch. Post procedure assessment: blood return through all ports  Patient tolerated the procedure well with no immediate complications. Additional procedure comments: Patient tolerated procedure well. Right IJ attempt unsuccessful . LIJ unsuccessful.  CG  .

## 2018-03-28 NOTE — Transfer of Care (Signed)
Immediate Anesthesia Transfer of Care Note  Patient: Erik [name]  Procedure(s) Performed: VIDEO ASSISTED THORACOSCOPY (VATS)/EMPYEMA (Right Chest) DECORTICATION (Right )  Patient Location: PACU  Anesthesia Type:General  Level of Consciousness: drowsy and patient cooperative  Airway & Oxygen Therapy: Patient Spontanous Breathing and Patient connected to face mask oxygen  Post-op Assessment: Report given to RN, Post -op Vital signs reviewed and stable and Patient moving all extremities  Post vital signs: Reviewed and stable  Last Vitals:  Vitals Value Taken Time  BP 136/105 03/28/2018  1:26 PM  Temp    Pulse 118 03/28/2018  1:37 PM  Resp 26 03/28/2018  1:37 PM  SpO2 94 % 03/28/2018  1:37 PM  Vitals shown include unvalidated device data.  Last Pain:  Vitals:   03/27/18 2319  TempSrc:   PainSc: 0-No pain      Patients Stated Pain Goal: 0 (20/60/15 6153)  Complications: No apparent anesthesia complications

## 2018-03-28 NOTE — Plan of Care (Signed)
  Problem: Education: Goal: Knowledge of disease or condition will improve Outcome: Progressing Goal: Knowledge of the prescribed therapeutic regimen will improve Outcome: Progressing   Problem: Activity: Goal: Risk for activity intolerance will decrease Outcome: Progressing   Problem: Cardiac: Goal: Hemodynamic stability will improve Outcome: Progressing   Problem: Respiratory: Goal: Respiratory status will improve Outcome: Progressing   Problem: Pain Management: Goal: Pain level will decrease Outcome: Progressing

## 2018-03-28 NOTE — Interval H&P Note (Signed)
History and Physical Interval Note: See my note form yesterday also 03/28/2018 7:18 AM  Erik [name]  has presented today for surgery, with the diagnosis of right empyema  The various methods of treatment have been discussed with the patient and family. After consideration of risks, benefits and other options for treatment, the patient has consented to  Procedure(s): VIDEO ASSISTED THORACOSCOPY (VATS)/EMPYEMA (Right) DECORTICATION (Right) as a surgical intervention .  The patient's history has been reviewed, patient examined, no change in status, stable for surgery.  I have reviewed the patient's chart and labs.  Questions were answered to the patient's satisfaction.     [name]

## 2018-03-28 NOTE — Progress Notes (Addendum)
Pt off to sent to pre-op via stetrecher and OR tech. All belongings given to sister and brother at the bedside. Pt received CHG bath and was clipped. Pt unable to void prior to leaving the unit. Report called to the anesthesiologist.  Patient safety maintained.

## 2018-03-28 NOTE — Anesthesia Procedure Notes (Signed)
Arterial Line Insertion Start/End4/23/2019 7:00 AM, 03/28/2018 7:01 AM Performed by: Kyung Rudd, CRNA, CRNA  Patient location: Pre-op. Preanesthetic checklist: patient identified, IV checked, site marked, risks and benefits discussed, surgical consent, monitors and equipment checked, pre-op evaluation, timeout performed and anesthesia consent Lidocaine 1% used for infiltration and patient sedated Left, radial was placed Catheter size: 20 G Hand hygiene performed , maximum sterile barriers used  and Seldinger technique used Allen's test indicative of satisfactory collateral circulation Attempts: 1 Procedure performed without using ultrasound guided technique. Following insertion, dressing applied and Biopatch. Post procedure assessment: normal  Patient tolerated the procedure well with no immediate complications. Additional procedure comments: Inserted by Vickii Penna, SRNA.
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
## 2018-03-28 NOTE — Progress Notes (Signed)
Orders for metoprolol for hr 130's per dr r fitzgerald/mda

## 2018-03-28 NOTE — Brief Op Note (Signed)
03/23/2018 - 03/28/2018  12:36 PM  PATIENT:  Erik [name]  58 y.o. male  PRE-OPERATIVE DIAGNOSIS:  right empyema  POST-OPERATIVE DIAGNOSIS:  right empyema  PROCEDURE:  Procedure(s): VIDEO ASSISTED THORACOSCOPY (VATS)/EMPYEMA (Right) DECORTICATION (Right)  SURGEON:  Surgeon(s) and Role:    * [name], MD - Primary  PHYSICIAN ASSISTANT:  Nicholes Rough, PA-C   ANESTHESIA:   general  EBL:  400 mL   BLOOD ADMINISTERED:none  DRAINS: TWO BLAKE DRAINS   LOCAL MEDICATIONS USED:  NONE  SPECIMEN:  Source of Specimen:  PLEURAL PEEL  DISPOSITION OF SPECIMEN:  PATHOLOGY  COUNTS:  YES  DICTATION: .Dragon Dictation  PLAN OF CARE: Admit to inpatient   PATIENT DISPOSITION:  ICU - intubated and hemodynamically stable.   Delay start of Pharmacological VTE agent (>24hrs) due to surgical blood loss or risk of bleeding: no
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
## 2018-03-28 NOTE — Anesthesia Procedure Notes (Addendum)
Procedure Name: Intubation Date/Time: 03/28/2018 7:48 AM Performed by: Kyung Rudd, CRNA Pre-anesthesia Checklist: Patient identified, Emergency Drugs available, Suction available and Patient being monitored Patient Re-evaluated:Patient Re-evaluated prior to induction Oxygen Delivery Method: Circle System Utilized Preoxygenation: Pre-oxygenation with 100% oxygen Induction Type: IV induction Ventilation: Two handed mask ventilation required Laryngoscope Size: Mac and 4 Grade View: Grade I Tube type: Oral Endobronchial tube: Double lumen EBT, Left, EBT position confirmed by auscultation and EBT position confirmed by fiberoptic bronchoscope and 41 Fr Number of attempts: 1 Airway Equipment and Method: Stylet Placement Confirmation: ETT inserted through vocal cords under direct vision,  positive ETCO2 and breath sounds checked- equal and bilateral Tube secured with: Tape Dental Injury: Teeth and Oropharynx as per pre-operative assessment  Comments: AOI per Regino Schultze, SRNA with Dr. Deatra Canter supervising.

## 2018-03-29 ENCOUNTER — Ambulatory Visit (HOSPITAL_COMMUNITY): Payer: Medicare Other

## 2018-03-29 ENCOUNTER — Other Ambulatory Visit (HOSPITAL_COMMUNITY): Payer: Medicare Other

## 2018-03-29 ENCOUNTER — Inpatient Hospital Stay (HOSPITAL_COMMUNITY): Payer: Medicare Other

## 2018-03-29 ENCOUNTER — Ambulatory Visit (HOSPITAL_COMMUNITY): Payer: Medicare Other | Admitting: Hematology

## 2018-03-29 ENCOUNTER — Encounter (HOSPITAL_COMMUNITY): Payer: Self-pay | Admitting: Thoracic Surgery (Cardiothoracic Vascular Surgery)

## 2018-03-29 LAB — BASIC METABOLIC PANEL
Anion gap: 8 (ref 5–15)
BUN: 6 mg/dL (ref 6–20)
CHLORIDE: 101 mmol/L (ref 101–111)
CO2: 30 mmol/L (ref 22–32)
Calcium: 7.9 mg/dL — ABNORMAL LOW (ref 8.9–10.3)
Creatinine, Ser: 0.58 mg/dL — ABNORMAL LOW (ref 0.61–1.24)
Glucose, Bld: 122 mg/dL — ABNORMAL HIGH (ref 65–99)
Potassium: 3.8 mmol/L (ref 3.5–5.1)
SODIUM: 139 mmol/L (ref 135–145)

## 2018-03-29 LAB — POCT I-STAT 7, (LYTES, BLD GAS, ICA,H+H)
Acid-Base Excess: 7 mmol/L — ABNORMAL HIGH (ref 0.0–2.0)
BICARBONATE: 36.6 mmol/L — AB (ref 20.0–28.0)
CALCIUM ION: 1.13 mmol/L — AB (ref 1.15–1.40)
HCT: 35 % — ABNORMAL LOW (ref 39.0–52.0)
Hemoglobin: 11.9 g/dL — ABNORMAL LOW (ref 13.0–17.0)
O2 SAT: 100 %
PCO2 ART: 84.6 mmHg — AB (ref 32.0–48.0)
Patient temperature: 37.2
Potassium: 4.8 mmol/L (ref 3.5–5.1)
Sodium: 139 mmol/L (ref 135–145)
TCO2: 39 mmol/L — AB (ref 22–32)
pH, Arterial: 7.245 — ABNORMAL LOW (ref 7.350–7.450)
pO2, Arterial: 315 mmHg — ABNORMAL HIGH (ref 83.0–108.0)

## 2018-03-29 LAB — POCT I-STAT 3, ART BLOOD GAS (G3+)
Acid-Base Excess: 9 mmol/L — ABNORMAL HIGH (ref 0.0–2.0)
Bicarbonate: 34.1 mmol/L — ABNORMAL HIGH (ref 20.0–28.0)
O2 SAT: 97 %
PCO2 ART: 48 mmHg (ref 32.0–48.0)
TCO2: 36 mmol/L — ABNORMAL HIGH (ref 22–32)
pH, Arterial: 7.46 — ABNORMAL HIGH (ref 7.350–7.450)
pO2, Arterial: 88 mmHg (ref 83.0–108.0)

## 2018-03-29 LAB — CBC
HCT: 34.5 % — ABNORMAL LOW (ref 39.0–52.0)
Hemoglobin: 10.7 g/dL — ABNORMAL LOW (ref 13.0–17.0)
MCH: 28.8 pg (ref 26.0–34.0)
MCHC: 31 g/dL (ref 30.0–36.0)
MCV: 93 fL (ref 78.0–100.0)
PLATELETS: 299 10*3/uL (ref 150–400)
RBC: 3.71 MIL/uL — AB (ref 4.22–5.81)
RDW: 16 % — ABNORMAL HIGH (ref 11.5–15.5)
WBC: 9.9 10*3/uL (ref 4.0–10.5)

## 2018-03-29 LAB — GLUCOSE, CAPILLARY
GLUCOSE-CAPILLARY: 100 mg/dL — AB (ref 65–99)
GLUCOSE-CAPILLARY: 122 mg/dL — AB (ref 65–99)
GLUCOSE-CAPILLARY: 138 mg/dL — AB (ref 65–99)
GLUCOSE-CAPILLARY: 158 mg/dL — AB (ref 65–99)
Glucose-Capillary: 150 mg/dL — ABNORMAL HIGH (ref 65–99)
Glucose-Capillary: 123 mg/dL — ABNORMAL HIGH (ref 65–99)

## 2018-03-29 MED ORDER — TIZANIDINE HCL 4 MG PO TABS
4.0000 mg | ORAL_TABLET | Freq: Three times a day (TID) | ORAL | Status: DC | PRN
  Administered 2018-03-29 – 2018-04-02 (×3): 4 mg via ORAL
  Filled 2018-03-29 (×3): qty 1

## 2018-03-29 MED ORDER — VARENICLINE TARTRATE 0.5 MG PO TABS
0.5000 mg | ORAL_TABLET | Freq: Two times a day (BID) | ORAL | Status: DC
  Administered 2018-03-29 – 2018-04-08 (×20): 0.5 mg via ORAL
  Filled 2018-03-29 (×23): qty 1

## 2018-03-29 NOTE — Progress Notes (Signed)
1 Day Post-Op Procedure(s) (LRB): VIDEO ASSISTED THORACOSCOPY (VATS)/EMPYEMA (Right) DECORTICATION (Right) Subjective: C/o incisional pain, denies nausea  Objective: Vital signs in last 24 hours: Temp:  [97.8 F (36.6 C)-99.3 F (37.4 C)] 98 F (36.7 C) (04/24 0745) Pulse Rate:  [94-133] 116 (04/24 0700) Cardiac Rhythm: Sinus tachycardia (04/23 2000) Resp:  [12-27] 20 (04/24 0700) BP: (109-143)/(66-105) 113/91 (04/24 0600) SpO2:  [90 %-100 %] 91 % (04/24 0700) Arterial Line BP: (104-152)/(61-101) 118/66 (04/24 0700)  Hemodynamic parameters for last 24 hours:    Intake/Output from previous day: 04/23 0701 - 04/24 0700 In: 4366.7 [P.O.:100; I.V.:3766.7; IV Piggyback:500] Out: 3009 [QZRAQ:7622; Blood:500; Chest Tube:590] Intake/Output this shift: No intake/output data recorded.  General appearance: alert, cooperative and mild distress Neurologic: intact Heart: regular rate and rhythm Lungs: clear on left, CT sounds on right Wound: intact + air leak, serosanguinous CT output  Lab Results: Recent Labs    03/28/18 0612 03/28/18 1121 03/29/18 0310  WBC 5.4  --  9.9  HGB 12.0* 11.9* 10.7*  HCT 39.1 35.0* 34.5*  PLT 257  --  299   BMET:  Recent Labs    03/27/18 1848 03/28/18 1121 03/29/18 0310  NA 137 139 139  K 4.0 4.8 3.8  CL 98*  --  101  CO2 31  --  30  GLUCOSE 130*  --  122*  BUN 5*  --  6  CREATININE 0.70  --  0.58*  CALCIUM 8.0*  --  7.9*    PT/INR:  Recent Labs    03/27/18 1848  LABPROT 15.0  INR 1.19   ABG    Component Value Date/Time   PHART 7.460 (H) 03/29/2018 0308   HCO3 34.1 (H) 03/29/2018 0308   TCO2 36 (H) 03/29/2018 0308   ACIDBASEDEF 4.0 (H) 07/19/2016 1407   O2SAT 97.0 03/29/2018 0308   CBG (last 3)  Recent Labs    03/28/18 1931 03/29/18 0000 03/29/18 0315  GLUCAP 144* 158* 122*    Assessment/Plan: S/P Procedure(s) (LRB): VIDEO ASSISTED THORACOSCOPY (VATS)/EMPYEMA (Right) DECORTICATION (Right) - Empyema in setting of  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  Lung incomplete reexpansion Mobilize SCD + enoxaparin for now Resume anticoagulation tomorrow CBG well controlled PCA for pain control Advance diet Mobilize     LOS: 6 days    [name] 03/29/2018

## 2018-03-29 NOTE — Progress Notes (Signed)
CT surgery p.m. Rounds  Resting quietly in bed, O2 saturation 94% Mild air leak from chest tube Stable after drainage of empyema and decortication

## 2018-03-29 NOTE — Progress Notes (Signed)
PROGRESS NOTE  Erik [name] WNU:[id number] DOB: September 17, 1961 DOA: 03/23/2018 PCP: [name], MD  HPI/Recap of past 41 hours: 57 year old male with a history of COPD on 2 L oxygen, lung cancer, presented to [facility] with shortness of breath and fevers. 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 He was transferred to [facility] for further treatments. On intravenous antibiotics. Cardiothoracic surgery team proceeded with video-assisted thoracoscopy (VATS) and decortication, indication being right empyema today 03/28/2018.   Patient is chronically on anticoagulation for pulmonary embolus diagnosed in 12/2017.   Patient was on IV heparin prior to surgery.  Heparin was discontinued prior to surgery.  03/29/18: Patient seen and examined at his bedside.  He reports pain on his right side at the site of chest tube insertion.  Has 600 cc of sanguinous fluid in the collector.  Assessment/Plan: Principal Problem:   Empyema of lung (HCC) Active Problems:   Depression with anxiety   Adenocarcinoma of right lung (HCC)   COPD (chronic obstructive pulmonary disease) (HCC)   Essential hypertension   Acute on chronic respiratory failure with hypoxia (HCC)   Sepsis due to pneumonia ([place])   History of pulmonary embolism   Empyema lung (HCC)  Acute on chronic hypoxic respiratory failure secondary to empyema in the setting of prior radiation Cardio thoracic surgery following.  Highly appreciated Continue O2 supplementation to maintain O2 saturation 92% or greater  Empyema in the setting of chronic effusion with prior radiation Continue IV vancomycin IV Zosyn Fluid culture pending.  Awaiting results.  Continue SCDs and subcu Lovenox. Pain management in place Continue Dulcolax and Senokot daily for bowel regimen Ambulate OOb to chair every shift/fall precaution  Tobacco use disorder Continue Chantix for tobacco cessation  Anemia of chronic  disease Hemoglobin stable at 10.7 Serosanguineous fluid in the collector from chest tube Repeat CBC in the morning  History of pulmonary embolism CTS, Dr Roxan Hockey, recommends restarting oral anticoagulation tomorrow Continue monitoring on telemetry Continue O2 supplementation to maintain O2 saturation 92% or greater.   Code Status: Full code  Family Communication: None at bedside  Disposition Plan: Will stay another midnight.  Awaiting culture results.  Continue IV Vanco and Zosyn.   Consultants:  Cardiothoracic surgery  Procedures:  VATS right-sided decortication from right-sided empyema  Antimicrobials:  IV vancomycin and IV Zosyn  DVT prophylaxis: SCDs, subcu Lovenox.   Objective: Vitals:   03/29/18 0700 03/29/18 0745 03/29/18 0758 03/29/18 0800  BP:    (!) 136/92  Pulse: (!) 116   (!) 102  Resp: 20  16 15   Temp:  98 F (36.7 C)    TempSrc:  Oral    SpO2: 91%  91% 90%  Weight:      Height:        Intake/Output Summary (Last 24 hours) at 03/29/2018 0911 Last data filed at 03/29/2018 0809 Gross per 24 hour  Intake 4606.67 ml  Output 3275 ml  Net 1331.67 ml   Filed Weights   03/25/18 0500 03/26/18 0500 03/27/18 0500  Weight: 94.3 kg (207 lb 14.3 oz) 96.3 kg (212 lb 4.9 oz) 96.3 kg (212 lb 4.8 oz)    Exam:   General: 57 year old Caucasian male well-developed well-nourished in no acute distress.  Alert and oriented x3.  Cardiovascular: Rate and rhythm with no rubs or gallops.  No JVD no thyromegaly present.  Respiratory: Diffuse rales bilaterally.  Right side of chest with chest tube in place. 600 cc of serosanguineous fluid  in the collector.  Abdomen: Soft nontender nondistended with normal bowel sounds x4 quadrants.  Musculoskeletal: Nonfocal moves all 4 extremities.  Trace edema lower extremity's bilaterally.  Skin: No ulcerative lesions or rashes.  Psychiatry: Mood is appropriate for condition and setting.   Data Reviewed: CBC: Recent  Labs  Lab 03/23/18 2016 03/24/18 0528 03/26/18 0440 03/27/18 0620 03/27/18 1848 03/28/18 0612 03/28/18 1121 03/29/18 0310  WBC 15.4* 7.6 6.0 5.5 5.8 5.4  --  9.9  NEUTROABS 13.7* 6.5  --   --   --   --   --   --   HGB 13.4 11.7* 11.6* 11.7* 11.4* 12.0* 11.9* 10.7*  HCT 41.4 36.6* 36.8* 37.7* 37.2* 39.1 35.0* 34.5*  MCV 91.4 92.2 93.6 95.4 96.4 96.8  --  93.0  PLT 294 233 216 226 239 257  --  431   Basic Metabolic Panel: Recent Labs  Lab 03/23/18 1922 03/24/18 0528 03/27/18 0620 03/27/18 1848 03/28/18 1121 03/29/18 0310  NA 134* 136 137 137 139 139  K 3.7 3.5 3.6 4.0 4.8 3.8  CL 92* 97* 99* 98*  --  101  CO2 28 29 32 31  --  30  GLUCOSE 137* 91 88 130*  --  122*  BUN 5* 5* <5* 5*  --  6  CREATININE 0.77 0.58* 0.55* 0.70  --  0.58*  CALCIUM 8.3* 7.9* 7.9* 8.0*  --  7.9*   GFR: Estimated Creatinine Clearance: 125.6 mL/min (A) (by C-G formula based on SCr of 0.58 mg/dL (L)). Liver Function Tests: Recent Labs  Lab 03/23/18 1922 03/27/18 1848  AST 45* 49*  ALT 35 28  ALKPHOS 220* 216*  BILITOT 1.2 0.4  PROT 7.8 6.6  ALBUMIN 2.2* 1.7*   No results for input(s): LIPASE, AMYLASE in the last 168 hours. No results for input(s): AMMONIA in the last 168 hours. Coagulation Profile: Recent Labs  Lab 03/23/18 1922 03/27/18 1848  INR 1.60 1.19   Cardiac Enzymes: No results for input(s): CKTOTAL, CKMB, CKMBINDEX, TROPONINI in the last 168 hours. BNP (last 3 results) No results for input(s): PROBNP in the last 8760 hours. HbA1C: No results for input(s): HGBA1C in the last 72 hours. CBG: Recent Labs  Lab 03/28/18 1547 03/28/18 1931 03/29/18 0000 03/29/18 0315 03/29/18 0740  GLUCAP 130* 144* 158* 122* 150*   Lipid Profile: No results for input(s): CHOL, HDL, LDLCALC, TRIG, CHOLHDL, LDLDIRECT in the last 72 hours. Thyroid Function Tests: No results for input(s): TSH, T4TOTAL, FREET4, T3FREE, THYROIDAB in the last 72 hours. Anemia Panel: No results for  input(s): VITAMINB12, FOLATE, FERRITIN, TIBC, IRON, RETICCTPCT in the last 72 hours. Urine analysis:    Component Value Date/Time   COLORURINE YELLOW 03/27/2018 1952   APPEARANCEUR CLEAR 03/27/2018 1952   LABSPEC 1.015 03/27/2018 1952   PHURINE 5.0 03/27/2018 1952   GLUCOSEU NEGATIVE 03/27/2018 1952   HGBUR NEGATIVE 03/27/2018 1952   BILIRUBINUR NEGATIVE 03/27/2018 [place] NEGATIVE 03/27/2018 1952   PROTEINUR NEGATIVE 03/27/2018 1952   NITRITE NEGATIVE 03/27/2018 1952   LEUKOCYTESUR NEGATIVE 03/27/2018 1952   Sepsis Labs: @LABRCNTIP (procalcitonin:4,lacticidven:4)  ) Recent Results (from the past 240 hour(s))  Culture, blood (Routine x 2)     Status: None   Collection Time: 03/23/18  7:23 PM  Result Value Ref Range Status   Specimen Description RIGHT ANTECUBITAL  Final   Special Requests   Final    BOTTLES DRAWN AEROBIC AND ANAEROBIC Blood Culture adequate volume   Culture   Final  NO GROWTH 5 DAYS Performed at [facility], [address], [place], [place] [address]    Report Status 03/28/2018 FINAL  Final  Culture, blood (Routine x 2)     Status: None   Collection Time: 03/23/18  7:23 PM  Result Value Ref Range Status   Specimen Description BLOOD RIGHT WRIST  Final   Special Requests   Final    BOTTLES DRAWN AEROBIC AND ANAEROBIC Blood Culture adequate volume   Culture   Final    NO GROWTH 5 DAYS Performed at [facility], [address], [place], [place] [address]    Report Status 03/28/2018 FINAL  Final  MRSA PCR Screening     Status: None   Collection Time: 03/24/18  3:36 PM  Result Value Ref Range Status   MRSA by PCR NEGATIVE NEGATIVE Final    Comment:        The GeneXpert MRSA Assay (FDA approved for NASAL specimens only), is one component of a comprehensive MRSA colonization surveillance program. It is not intended to diagnose MRSA infection nor to guide or monitor treatment for MRSA infections. Performed at [facility], [address],  [place], [place] [address]   Culture, respiratory (NON-Expectorated)     Status: None   Collection Time: 03/24/18  3:49 PM  Result Value Ref Range Status   Specimen Description   Final    EXPECTORATED SPUTUM Performed at [facility], [address], [place], [place] [address]    Special Requests   Final    NONE Performed at [facility], [address], [place], [place] [address]    Gram Stain   Final    ABUNDANT WBC PRESENT,BOTH PMN AND MONONUCLEAR RARE GRAM POSITIVE COCCI    Culture   Final    FEW Consistent with normal respiratory flora. Performed at [facility], [place] [address] [place], [place] [address]    Report Status 03/26/2018 FINAL  Final  Body fluid culture     Status: None (Preliminary result)   Collection Time: 03/28/18  8:59 AM  Result Value Ref Range Status   Specimen Description PLEURAL  Final   Special Requests RIGHT  Final   Gram Stain   Final    MODERATE WBC PRESENT, PREDOMINANTLY PMN NO ORGANISMS SEEN Performed at [facility], [place] [address] [place], [place] [address]    Culture PENDING  Incomplete   Report Status PENDING  Incomplete  Aerobic/Anaerobic Culture (surgical/deep wound)     Status: None (Preliminary result)   Collection Time: 03/28/18  9:23 AM  Result Value Ref Range Status   Specimen Description LUNG RIGHT  Final   Special Requests PLEURAL PEEL  Final   Gram Stain   Final    FEW WBC PRESENT,BOTH PMN AND MONONUCLEAR NO ORGANISMS SEEN Performed at [facility], [address], [place], [place] [address]    Culture PENDING  Incomplete   Report Status PENDING  Incomplete      Studies: Dg Chest Portable 1 View  Result Date: 03/28/2018 CLINICAL DATA:  Check for retained instruments or surgical objects EXAM: PORTABLE CHEST 1 VIEW COMPARISON:  Preoperative chest x-ray of March 23, 2017 FINDINGS: Patient has apparently undergone right sided surgery surgery for drainage of a large empyema. There are chest tubes present. A small  amount of pleural space air is noted inferiorly. There is shift of the mediastinum toward the right. The left lung is well-expanded. There is infiltrate or atelectasis at the left lung  base. No abnormal surgical instruments are retained. A power port is present is tip projects over the cavoatrial junction. There is a left internal jugular venous catheter tip projects over the proximal SVC. IMPRESSION: No retained metallic surgical instruments are observed. There are postsurgical changes in the right hemithorax. The 2 right-sided chest tubes as well as the power port catheter and the left internal jugular venous catheter are in position as described. There is some pleural space air in the inferior aspect of the right hemithorax likely introduced during drainage of the suspected empyema. New increased density at the left lung base which may reflect atelectasis or developing pneumonia. This report was called to OR 17's nurse, Crystal, at 1:03 p.m. on 28 March 2018. Electronically Signed   By: David  Martinique M.D.   On: 03/28/2018 13:06    Scheduled Meds: . acetaminophen  1,000 mg Oral Q6H   Or  . acetaminophen (TYLENOL) oral liquid 160 mg/5 mL  1,000 mg Oral Q6H  . bisacodyl  10 mg Oral Daily  . enoxaparin (LOVENOX) injection  40 mg Subcutaneous Daily  . escitalopram  20 mg Oral Daily  . fentaNYL   Intravenous Q4H  . gabapentin  600 mg Oral BID  . insulin aspart  0-24 Units Subcutaneous Q4H  . mirtazapine  15 mg Oral QHS  . polyvinyl alcohol  2 drop Both Eyes Daily  . senna-docusate  1 tablet Oral QHS  . varenicline  0.5 mg Oral BID    Continuous Infusions: . sodium chloride 10 mL/hr at 03/29/18 0757  . piperacillin-tazobactam (ZOSYN)  IV Stopped (03/29/18 0809)  . potassium chloride    . vancomycin Stopped (03/29/18 6415)     LOS: 6 days     Kayleen Memos, MD [facility] Pager [phone]  If 7PM-7AM, please contact night-coverage [email or web address] Password [facility] 03/29/2018, 9:11  AM

## 2018-03-29 NOTE — Care Management Note (Signed)
Case Management Note Marvetta Gibbons RN, BSN Unit 4E-Case Manager-- [place] coverage [phone]  Patient Details  Name: Erik [name] MRN: [id number] Date of Birth: Dec 04, 1961  Subjective/Objective:  Pt admitted with empyema of lung-(hx of COPD, acute on chronic resp. Failure, lung cancer) tx to 2H from 2W s/p VATS on 03/28/18                  Action/Plan: PTA pt lived at home-independent , has home 02, CM to follow post op for transition of care needs.   Expected Discharge Date:                  Expected Discharge Plan:  [place]  In-House Referral:     Discharge planning Services  CM Consult  Post Acute Care Choice:    Choice offered to:  Patient  DME Arranged:    DME Agency:     HH Arranged:    [place] Agency:     Status of Service:  In process, will continue to follow  If discussed at Long Length of Stay Meetings, dates discussed:    Discharge Disposition:   Additional Comments:  Dawayne Patricia, RN 03/29/2018, 11:02 AM

## 2018-03-30 ENCOUNTER — Inpatient Hospital Stay (HOSPITAL_COMMUNITY): Payer: Medicare Other

## 2018-03-30 DIAGNOSIS — E440 Moderate protein-calorie malnutrition: Secondary | ICD-10-CM

## 2018-03-30 LAB — TYPE AND SCREEN
ABO/RH(D): O NEG
ANTIBODY SCREEN: NEGATIVE
UNIT DIVISION: 0
Unit division: 0

## 2018-03-30 LAB — GLUCOSE, CAPILLARY
GLUCOSE-CAPILLARY: 124 mg/dL — AB (ref 65–99)
GLUCOSE-CAPILLARY: 85 mg/dL (ref 65–99)
Glucose-Capillary: 109 mg/dL — ABNORMAL HIGH (ref 65–99)

## 2018-03-30 LAB — COMPREHENSIVE METABOLIC PANEL
ALT: 22 U/L (ref 17–63)
ANION GAP: 8 (ref 5–15)
AST: 30 U/L (ref 15–41)
Albumin: 1.7 g/dL — ABNORMAL LOW (ref 3.5–5.0)
Alkaline Phosphatase: 156 U/L — ABNORMAL HIGH (ref 38–126)
BILIRUBIN TOTAL: 0.7 mg/dL (ref 0.3–1.2)
BUN: 6 mg/dL (ref 6–20)
CO2: 30 mmol/L (ref 22–32)
Calcium: 8.2 mg/dL — ABNORMAL LOW (ref 8.9–10.3)
Chloride: 103 mmol/L (ref 101–111)
Creatinine, Ser: 0.54 mg/dL — ABNORMAL LOW (ref 0.61–1.24)
GFR calc Af Amer: >60 mL/min (ref >60)
GFR calc non Af Amer: >60 mL/min (ref >60)
GLUCOSE: 100 mg/dL — AB (ref 65–99)
POTASSIUM: 3.5 mmol/L (ref 3.5–5.1)
SODIUM: 141 mmol/L (ref 135–145)
Total Protein: 6.3 g/dL — ABNORMAL LOW (ref 6.5–8.1)

## 2018-03-30 LAB — CBC
HEMATOCRIT: 34.9 % — AB (ref 39.0–52.0)
Hemoglobin: 11 g/dL — ABNORMAL LOW (ref 13.0–17.0)
MCH: 29.2 pg (ref 26.0–34.0)
MCHC: 31.5 g/dL (ref 30.0–36.0)
MCV: 92.6 fL (ref 78.0–100.0)
Platelets: 311 10*3/uL (ref 150–400)
RBC: 3.77 MIL/uL — ABNORMAL LOW (ref 4.22–5.81)
RDW: 16.2 % — AB (ref 11.5–15.5)
WBC: 9.7 10*3/uL (ref 4.0–10.5)

## 2018-03-30 LAB — BPAM RBC
Blood Product Expiration Date: 201905242359
Blood Product Expiration Date: 201905242359
UNIT TYPE AND RH: 9500
UNIT TYPE AND RH: 9500

## 2018-03-30 LAB — VANCOMYCIN, TROUGH: Vancomycin Tr: 16 ug/mL (ref 15–20)

## 2018-03-30 MED ORDER — BOOST PLUS PO LIQD
237.0000 mL | Freq: Two times a day (BID) | ORAL | Status: DC
  Administered 2018-03-30 – 2018-04-08 (×13): 237 mL via ORAL
  Filled 2018-03-30 (×23): qty 237

## 2018-03-30 MED ORDER — APIXABAN 5 MG PO TABS
5.0000 mg | ORAL_TABLET | Freq: Two times a day (BID) | ORAL | Status: DC
  Administered 2018-03-30 – 2018-04-08 (×18): 5 mg via ORAL
  Filled 2018-03-30 (×18): qty 1

## 2018-03-30 MED ORDER — CHLORHEXIDINE GLUCONATE CLOTH 2 % EX PADS
6.0000 | MEDICATED_PAD | Freq: Every day | CUTANEOUS | Status: DC
  Administered 2018-03-30 – 2018-04-03 (×4): 6 via TOPICAL

## 2018-03-30 MED ORDER — SODIUM CHLORIDE 0.9% FLUSH: 10.0000 mL | INTRAVENOUS | Status: DC | PRN

## 2018-03-30 MED ORDER — POTASSIUM CHLORIDE 10 MEQ/50ML IV SOLN
10.0000 meq | INTRAVENOUS | Status: AC | PRN
  Administered 2018-03-30 (×3): 10 meq via INTRAVENOUS
  Filled 2018-03-30 (×3): qty 50

## 2018-03-30 MED ORDER — SODIUM CHLORIDE 0.9% FLUSH
10.0000 mL | Freq: Two times a day (BID) | INTRAVENOUS | Status: DC
  Administered 2018-03-30: 10 mL
  Administered 2018-04-01: 20 mL
  Administered 2018-04-01: 10 mL
  Administered 2018-04-02: 20 mL
  Administered 2018-04-02 – 2018-04-07 (×9): 10 mL

## 2018-03-30 MED ORDER — APIXABAN 5 MG PO TABS: 5.0000 mg | ORAL_TABLET | Freq: Two times a day (BID) | ORAL | Status: DC

## 2018-03-30 NOTE — Op Note (Signed)
NAME:  DINO, BORNTREGER NO.:  MEDICAL RECORD NO.:  [id number]  LOCATION:                                 FACILITY:  PHYSICIAN:  Revonda Standard. Roxan Hockey, M.D.DATE OF BIRTH:  08-30-1961  DATE OF PROCEDURE:  03/28/2018 DATE OF DISCHARGE:                              OPERATIVE REPORT   PREOPERATIVE DIAGNOSIS:  Right empyema.  POSTOPERATIVE DIAGNOSIS:  Right empyema.  PROCEDURES:   Right video-assisted thoracoscopy, Drainage of empyema, Decortication.  SURGEON:  Revonda Standard. Roxan Hockey, M.D.  ASSISTANT:  Nicholes Rough, PA.  ANESTHESIA:  General.  FINDINGS:  Multiloculated collection of murky pleural fluid.  Thick fibrous peel on the lower and middle lobes. Upper lobe densely adherent to the lateral and anterior chest wall.  Incomplete re- expansion.  Frozen section of peel showed no malignancy.  CLINICAL NOTE:  Mr. Newmark is a 57 year old man with a history of stage III lung cancer, treated with chemotherapy and radiation.  He has had a persistent right pleural effusion for over a year now.  He presented with shortness of breath and right-sided pleuritic chest pain.  A chest x-ray and CT of the chest showed air-fluid levels within his pleural fluid collection.  The fluid appeared to be loculated, consistent with an empyema.  He was treated with broad-spectrum antibiotics.  He was offered the option of surgical drainage and possible decortication.  The indications, risks, benefits, and alternatives were discussed in detail with the patient.  He understood and accepted the risks and agreed to proceed.  OPERATIVE NOTE:  Mr. Conroy was brought to the operating room on March 28, 2018.  He had an induction of general anesthesia and was intubated with a double-lumen endotracheal tube.  He was already receiving intravenous antibiotics and no additional antibiotics were administered.  A Foley catheter was placed.  Sequential compression devices were placed on  the calves for DVT prophylaxis.  He was placed in a left lateral decubitus position and the right chest was prepped and draped in usual sterile fashion.  Single lung ventilation of the left lung was initiated and it was tolerated well throughout the procedure.  After performing a time- out, a port incision was made in the 7th intercostal space in the midaxillary line.  The chest was entered bluntly using a hemostat.  A finger was placed in the chest and some adhesions were taken down.  A port was placed through the incision and the thoracoscope was advanced into the chest where there was limited visualization.  A 5-cm working incision was made in the fifth interspace anterolaterally.  After entering the chest there, blunt dissection allowed creation of a space and better visualization with the scope.  The lung was densely adherent to the interspace above the thoracotomy.  A sucker was advanced into the chest.  There were multiple loculated areas of fluid collection, which were broken up.  There was some frankly purulent fluid and fibrinous debris as well.  This was all evacuated.  This tissue was very friable and bled easily.  The lower and middle lobes were encased in a thick fibrous pleural peel.  The upper lobe was densely  adherent to the chest wall laterally and anteriorly above the level of the incision.  Using the scope for visualization, an attempt was made to create a plane and remove the fibrous peel from the lung and initially this was appeared to be very successful.  A piece of the peel was sent for frozen section, which returned with no malignant cells seen.  Decision was made to attempt to decortication, but to do so, a thoracotomy would be necessary.  The inferior rib at the incision was cut and a retractor was placed.  The incision was extended, but there was still relatively limited access because of the patient's body habitus.  The peel then was stripped off the lower and  middle lobes and the fissures were freed up as much as possible.  The lung was periodically reinflated to assess progress.  Significant progress was made, but there were areas that would not re-expand even after essentially all of the peel had been removed.  Finally, in some areas, the pleura was incised with electrocautery in a cross hatch fashion to allow additional expansion of the lung.  The chest was copiously irrigated with warm saline on multiple occasions.  Two 32-French Blake drains were placed.  One was placed through the original port incision.  The other was placed through a second port-type incision.  These were secured to skin with #1 silk sutures.  The ribs were reapproximated with a #2 Vicryl pericostal suture.  The fascia was closed with a running #1 Vicryl suture.  The subcutaneous tissue and skin were closed in a standard fashion.  All sponge, needle, and instrument counts were correct at the end of the procedure.  The patient was taken from the operating room to the postanesthetic care unit, extubated and in good condition.     Revonda Standard Roxan Hockey, M.D.     SCH/MEDQ  D:  03/29/2018  T:  03/29/2018  Job:  [id number]

## 2018-03-30 NOTE — Progress Notes (Signed)
2 Days Post-Op Procedure(s) (LRB): VIDEO ASSISTED THORACOSCOPY (VATS)/EMPYEMA (Right) DECORTICATION (Right) Subjective: C/o pain from incisions/ chest tubes  Objective: Vital signs in last 24 hours: Temp:  [97.8 F (36.6 C)-98.4 F (36.9 C)] 98.2 F (36.8 C) (04/25 0400) Pulse Rate:  [83-114] 110 (04/25 0700) Cardiac Rhythm: Sinus tachycardia (04/25 0400) Resp:  [5-22] 17 (04/25 0747) BP: (93-142)/(62-99) 142/89 (04/25 0700) SpO2:  [89 %-97 %] 97 % (04/25 0747) Arterial Line BP: (122-153)/(68-73) 122/69 (04/24 1100)  Hemodynamic parameters for last 24 hours:    Intake/Output from previous day: 04/24 0701 - 04/25 0700 In: 1885.5 [P.O.:720; I.V.:365.5; IV Piggyback:800] Out: 2775 [Urine:2145; Chest Tube:630] Intake/Output this shift: Total I/O In: -  Out: 145 [Urine:145]  General appearance: alert, cooperative and mild distress Neurologic: intact Heart: regular rate and rhythm Lungs: clear on left, minimal air movement on right Abdomen: normal findings: soft, non-tender + air leak  Lab Results: Recent Labs    03/29/18 0310 03/30/18 0223  WBC 9.9 9.7  HGB 10.7* 11.0*  HCT 34.5* 34.9*  PLT 299 311   BMET:  Recent Labs    03/29/18 0310 03/30/18 0223  NA 139 141  K 3.8 3.5  CL 101 103  CO2 30 30  GLUCOSE 122* 100*  BUN 6 6  CREATININE 0.58* 0.54*  CALCIUM 7.9* 8.2*    PT/INR:  Recent Labs    03/27/18 1848  LABPROT 15.0  INR 1.19   ABG    Component Value Date/Time   PHART 7.460 (H) 03/29/2018 0308   HCO3 34.1 (H) 03/29/2018 0308   TCO2 36 (H) 03/29/2018 0308   ACIDBASEDEF 4.0 (H) 07/19/2016 1407   O2SAT 97.0 03/29/2018 0308   CBG (last 3)  Recent Labs    03/29/18 1610 03/29/18 1950 03/30/18 0004  GLUCAP 100* 123* 85    Assessment/Plan: S/P Procedure(s) (LRB): VIDEO ASSISTED THORACOSCOPY (VATS)/EMPYEMA (Right) DECORTICATION (Right) Plan for transfer to step-down: see transfer orders  CV- stable  RESP- empyema, CT in place,  incomplete reexpansion of lung with decortication  Will try CT to water seal  RENAL- creatinine and lytes OK  GI- tolerating POs  ENDO- CBG well controlled- dc SSI  SCD + enoxaparin for DVT prophylaxis  Mobilize as tolerated   LOS: 7 days    Erik [name] 03/30/2018

## 2018-03-30 NOTE — Anesthesia Postprocedure Evaluation (Signed)
Anesthesia Post Note  Patient: Erik [name]  Procedure(s) Performed: VIDEO ASSISTED THORACOSCOPY (VATS)/EMPYEMA (Right Chest) DECORTICATION (Right )     Patient location during evaluation: PACU Anesthesia Type: General Level of consciousness: awake and alert Pain management: pain level controlled Vital Signs Assessment: post-procedure vital signs reviewed and stable Respiratory status: spontaneous breathing, nonlabored ventilation, respiratory function stable and patient connected to nasal cannula oxygen Cardiovascular status: blood pressure returned to baseline and stable Postop Assessment: no apparent nausea or vomiting Anesthetic complications: no    Last Vitals:  Vitals:   03/30/18 0600 03/30/18 0700  BP: (!) 136/99 (!) 142/89  Pulse: (!) 107 (!) 110  Resp: 14 17  Temp:    SpO2: 95% 94%    Last Pain:  Vitals:   03/30/18 0400  TempSrc: Axillary  PainSc: Asleep   Pain Goal: Patients Stated Pain Goal: 3 (03/29/18 1715)               Tiajuana Amass

## 2018-03-30 NOTE — Progress Notes (Signed)
Pharmacy Antibiotic Note  Erik [name] is a 57 y.o. male admitted on 03/23/2018 with empyema/pneumonia. Pharmacy has been consulted for vancomycin and Zosyn dosing. WBC WNL, currently AF. Scr has been stable < 1, estimated CrCl > 100 mL/min. VT this afternoon is within goal range at 16.  Plan: Continue vancomycin 1000mg  IV q8h Continue Zosyn 3.375g IV every 8 hours (4-hour infusion) F/u C&S, clinical status, renal function, vancomycin levels as indicated, de-escalation, and LOT  Height: 6' (182.9 cm) Weight: 212 lb 4.8 oz (96.3 kg) IBW/kg (Calculated) : 77.6  Temp (24hrs), Avg:98.2 F (36.8 C), Min:97.8 F (36.6 C), Max:98.4 F (36.9 C)  Recent Labs  Lab 03/23/18 1944  03/23/18 2250 03/23/18 2317 03/24/18 0204 03/24/18 0528 03/25/18 1319  03/26/18 2330 03/27/18 0620 03/27/18 1848 03/28/18 0612 03/29/18 0310 03/30/18 0223  WBC  --    < >  --   --   --  7.6  --    < >  --  5.5 5.8 5.4 9.9 9.7  CREATININE  --   --   --   --   --  0.58*  --   --   --  0.55* 0.70  --  0.58* 0.54*  LATICACIDVEN 2.68*  --  1.24 0.8 1.0  --   --   --   --   --   --   --   --   --   VANCOTROUGH  --   --   --   --   --   --  22*  --  11*  --   --   --   --   --    < > = values in this interval not displayed.    Estimated Creatinine Clearance: 125.6 mL/min (A) (by C-G formula based on SCr of 0.54 mg/dL (L)).    Antimicrobials this admission: Vanc 4/18>> Zosyn 4/18>>  Dose adjustments this admission: 4/21 VT = 11 on 750mg  q8h >> inc to 1g q8h 4/25 VT = 16 on 1g q8h >> no changes  Microbiology results: 4/18 BCx: NG final 4/19 MRSA PCR: neg 4/23 pleural fluid cx: pending 4/23 pleural cx: pending  Thank you for allowing pharmacy to be a part of this patient's care.  Mila Merry Gerarda Fraction, PharmD PGY1 Pharmacy Resident Pager: [phone] 03/30/2018 10:37 AM

## 2018-03-30 NOTE — Progress Notes (Signed)
      [address]       [place] [address]             [phone]      C/o feeling hot  BP 136/82 (BP Location: Right Arm)   Pulse (!) 112   Temp 97.7 F (36.5 C) (Oral)   Resp 18   Ht 6' (1.829 m)   Wt 212 lb 4.8 oz (96.3 kg)   SpO2 95%   BMI 28.79 kg/m   Intake/Output Summary (Last 24 hours) at 03/30/2018 1745 Last data filed at 03/30/2018 1600 Gross per 24 hour  Intake 1735 ml  Output 2730 ml  Net -995 ml   Air leak unchanged SQ emphysema unchanged Awaiting step down bed  Remo Lipps C. Roxan Hockey, MD [facility] [phone]

## 2018-03-30 NOTE — Progress Notes (Signed)
Nutrition Follow-up  DOCUMENTATION CODES:   Non-severe (moderate) malnutrition in context of chronic illness  INTERVENTION:   Add Boost Plus BID between meals   NUTRITION DIAGNOSIS:   Moderate Malnutrition related to chronic illness, cancer and cancer related treatments as evidenced by mild fat depletion, mild muscle depletion, moderate muscle depletion, edema.  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 Pt with hx of COPD on 2L oxygen, lung cancer, HTN, depression/anxiety   4/23 VATS with drainage of empyema and decortication  Pt reports appetite is "not good". Pt ate 50% at breakfast this AM, drinking fluids well, likes jello. Recorded po intake 25-100% of meals. Pt drinks Boost at home; RD to order Boost Plus BID  Pt reports weight loss of 15 pounds in 2 months; 6.8% wt loss for time frame. Pt reports his UBW prior to getting sick was 270 pounds (pt reports he last weight this 2-3 years ago). Pt weight of 207 pounds likely closer to dry weight, current wt 212 pounds  NUTRITION - FOCUSED PHYSICAL EXAM:    Most Recent Value  Orbital Region  Mild depletion  Upper Arm Region  Mild depletion  Thoracic and Lumbar Region  No depletion  Buccal Region  No depletion  Temple Region  Moderate depletion  Clavicle Bone Region  Mild depletion  Clavicle and Acromion Bone Region  Mild depletion  Scapular Bone Region  Mild depletion  Dorsal Hand  No depletion  Patellar Region  No depletion  Anterior Thigh Region  No depletion  Posterior Calf Region  No depletion  Edema (RD Assessment)  Mild       Diet Order:  Diet Heart Room service appropriate? Yes; Fluid consistency:  Thin  EDUCATION NEEDS:   No education needs have been identified at this time  Skin:  Skin Assessment: Reviewed RN Assessment  Last BM:  4/18  Height:   Ht Readings from Last 1 Encounters:  03/24/18 6' (1.829 m)    Weight:   Wt Readings from Last 1 Encounters:  03/27/18 212 lb 4.8 oz (96.3 kg)    Ideal Body Weight:  80.91 kg  BMI:  Body mass index is 28.79 kg/m.  Estimated Nutritional Needs:   Kcal:  2150-2350 (23-25 kcal/kg bw)  Protein:  105-120g Pro (1.1-1.3g/kg bw)  Fluid:  2.2-2.4 L fluid (1 ml/kcal)   Kerman Passey MS, RD, LDN, CNSC [phone] Pager  [phone] Weekend/On-Call Pager

## 2018-03-30 NOTE — Progress Notes (Addendum)
PROGRESS NOTE  JENO CALLEROS RKY:[id number] DOB: Jul 27, 1961 DOA: 03/23/2018 PCP: [name], MD  HPI/Recap of past 44 hours: 57 year old male with a history of COPD on 2 L oxygen, lung cancer, presented to [facility] with shortness of breath and fevers. 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 He was transferred to [facility] for further treatments. On intravenous antibiotics. Cardiothoracic surgery team proceeded with video-assisted thoracoscopy (VATS) with drainage of empyema and decortication on 4/23.   Patient is chronically on anticoagulation for pulmonary embolus diagnosed in 12/2017.   Patient was on IV heparin prior to surgery.  Heparin was discontinued prior to surgery.  Since decortication, patient doing okay, still feels rough.  He is not somewhat short of breath as it is painful to take deep breaths.  Fatigue.  Denies any chest pain.  Assessment/Plan: Principal Problem:   Empyema of lung (HCC) Active Problems:   Depression with anxiety   Adenocarcinoma of right lung (HCC)   COPD (chronic obstructive pulmonary disease) (HCC)   Essential hypertension   Acute on chronic respiratory failure with hypoxia (HCC)   Sepsis due to pneumonia ([place])   History of pulmonary embolism   Empyema lung (HCC)   Empyema in the setting of chronic effusion with prior radiation Continue broad-spectrum IV antibiotics.  Appreciate cardiothoracic surgery help.  Oxygen saturation stable.  Encouraging and spirometer use. Fluid culture pending.  Awaiting results.   Pain management in place Continue Dulcolax and Senokot daily for bowel regimen Ambulate OOb to chair every shift/fall precaution  Essential hypertension: Elevated blood pressures secondary to pain, tachycardic.  Will add low-dose IV Lopressor.  Tobacco use disorder Continue Chantix for tobacco cessation  Anemia of chronic disease Hemoglobin remained stable. Serosanguineous  fluid in the collector from chest tube  History of pulmonary embolism Restarting Eliquis as okay by CVTS Continue monitoring on telemetry Continue O2 supplementation to maintain O2 saturation 92% or greater.   Code Status: Full code  Family Communication: Left message for sister  Disposition Plan: Will stay another midnight.  Awaiting culture results.  Continue IV Vanco and Zosyn.   Consultants:  Cardiothoracic surgery  Procedures:  VATS right-sided decortication from right-sided empyema  Antimicrobials:  IV vancomycin and IV Zosyn 4/18-present  DVT prophylaxis: SCDs, subcu Lovenox.   Objective: Vitals:   03/30/18 1100 03/30/18 1137 03/30/18 1145 03/30/18 1148  BP: (!) 135/102     Pulse: (!) 123     Resp: (!) 22  17 20   Temp:  98.4 F (36.9 C)    TempSrc:  Oral    SpO2: 92%  94% 95%  Weight:      Height:        Intake/Output Summary (Last 24 hours) at 03/30/2018 1234 Last data filed at 03/30/2018 1130 Gross per 24 hour  Intake 1755 ml  Output 2690 ml  Net -935 ml   Filed Weights   03/25/18 0500 03/26/18 0500 03/27/18 0500  Weight: 94.3 kg (207 lb 14.3 oz) 96.3 kg (212 lb 4.9 oz) 96.3 kg (212 lb 4.8 oz)    Exam:   General: Alert and oriented x3, mild distress secondary to pain  HEENT: Normocephalic atraumatic, mucous membranes are slightly dry  Neck: Supple, no JVD  Cardiovascular: Regular rhythm, tachycardic  Respiratory: Moderate inspiratory effort, decreased breath sounds on right at site of chest tube placement  Abdomen: Soft, nontender, nondistended, positive bowel sounds  Musculoskeletal: No clubbing or cyanosis, trace pitting edema  Neuro: No focal  deficits  Skin: No skin breaks, tears or lesions  Psychiatry: Appropriate, no evidence of psychosis   Data Reviewed: CBC: Recent Labs  Lab 03/23/18 2016 03/24/18 0528  03/27/18 0620 03/27/18 1848 03/28/18 0612 03/28/18 1121 03/29/18 0310 03/30/18 0223  WBC 15.4* 7.6   < > 5.5  5.8 5.4  --  9.9 9.7  NEUTROABS 13.7* 6.5  --   --   --   --   --   --   --   HGB 13.4 11.7*   < > 11.7* 11.4* 12.0* 11.9* 10.7* 11.0*  HCT 41.4 36.6*   < > 37.7* 37.2* 39.1 35.0* 34.5* 34.9*  MCV 91.4 92.2   < > 95.4 96.4 96.8  --  93.0 92.6  PLT 294 233   < > 226 239 257  --  299 311   < > = values in this interval not displayed.   Basic Metabolic Panel: Recent Labs  Lab 03/24/18 0528 03/27/18 0620 03/27/18 1848 03/28/18 1121 03/29/18 0310 03/30/18 0223  NA 136 137 137 139 139 141  K 3.5 3.6 4.0 4.8 3.8 3.5  CL 97* 99* 98*  --  101 103  CO2 29 32 31  --  30 30  GLUCOSE 91 88 130*  --  122* 100*  BUN 5* <5* 5*  --  6 6  CREATININE 0.58* 0.55* 0.70  --  0.58* 0.54*  CALCIUM 7.9* 7.9* 8.0*  --  7.9* 8.2*   GFR: Estimated Creatinine Clearance: 125.6 mL/min (A) (by C-G formula based on SCr of 0.54 mg/dL (L)). Liver Function Tests: Recent Labs  Lab 03/23/18 1922 03/27/18 1848 03/30/18 0223  AST 45* 49* 30  ALT 35 28 22  ALKPHOS 220* 216* 156*  BILITOT 1.2 0.4 0.7  PROT 7.8 6.6 6.3*  ALBUMIN 2.2* 1.7* 1.7*   No results for input(s): LIPASE, AMYLASE in the last 168 hours. No results for input(s): AMMONIA in the last 168 hours. Coagulation Profile: Recent Labs  Lab 03/23/18 1922 03/27/18 1848  INR 1.60 1.19   Cardiac Enzymes: No results for input(s): CKTOTAL, CKMB, CKMBINDEX, TROPONINI in the last 168 hours. BNP (last 3 results) No results for input(s): PROBNP in the last 8760 hours. HbA1C: No results for input(s): HGBA1C in the last 72 hours. CBG: Recent Labs  Lab 03/29/18 1610 03/29/18 1950 03/30/18 0004 03/30/18 0319 03/30/18 0748  GLUCAP 100* 123* 85 109* 124*   Lipid Profile: No results for input(s): CHOL, HDL, LDLCALC, TRIG, CHOLHDL, LDLDIRECT in the last 72 hours. Thyroid Function Tests: No results for input(s): TSH, T4TOTAL, FREET4, T3FREE, THYROIDAB in the last 72 hours. Anemia Panel: No results for input(s): VITAMINB12, FOLATE, FERRITIN, TIBC,  IRON, RETICCTPCT in the last 72 hours. Urine analysis:    Component Value Date/Time   COLORURINE YELLOW 03/27/2018 1952   APPEARANCEUR CLEAR 03/27/2018 1952   LABSPEC 1.015 03/27/2018 1952   PHURINE 5.0 03/27/2018 1952   GLUCOSEU NEGATIVE 03/27/2018 1952   HGBUR NEGATIVE 03/27/2018 1952   BILIRUBINUR NEGATIVE 03/27/2018 [place] NEGATIVE 03/27/2018 1952   PROTEINUR NEGATIVE 03/27/2018 1952   NITRITE NEGATIVE 03/27/2018 1952   LEUKOCYTESUR NEGATIVE 03/27/2018 1952   Sepsis Labs: @LABRCNTIP (procalcitonin:4,lacticidven:4)  ) Recent Results (from the past 240 hour(s))  Culture, blood (Routine x 2)     Status: None   Collection Time: 03/23/18  7:23 PM  Result Value Ref Range Status   Specimen Description RIGHT ANTECUBITAL  Final   Special Requests   Final  BOTTLES DRAWN AEROBIC AND ANAEROBIC Blood Culture adequate volume   Culture   Final    NO GROWTH 5 DAYS Performed at [facility], [address], [place], [place] [address]    Report Status 03/28/2018 FINAL  Final  Culture, blood (Routine x 2)     Status: None   Collection Time: 03/23/18  7:23 PM  Result Value Ref Range Status   Specimen Description BLOOD RIGHT WRIST  Final   Special Requests   Final    BOTTLES DRAWN AEROBIC AND ANAEROBIC Blood Culture adequate volume   Culture   Final    NO GROWTH 5 DAYS Performed at [facility], [address] [place], [place] [address]    Report Status 03/28/2018 FINAL  Final  MRSA PCR Screening     Status: None   Collection Time: 03/24/18  3:36 PM  Result Value Ref Range Status   MRSA by PCR NEGATIVE NEGATIVE Final    Comment:        The GeneXpert MRSA Assay (FDA approved for NASAL specimens only), is one component of a comprehensive MRSA colonization surveillance program. It is not intended to diagnose MRSA infection nor to guide or monitor treatment for MRSA infections. Performed at [facility], [address] [place], [place] [address]   Culture, respiratory  (NON-Expectorated)     Status: None   Collection Time: 03/24/18  3:49 PM  Result Value Ref Range Status   Specimen Description   Final    EXPECTORATED SPUTUM Performed at [facility], [address] [place], [place] [address]    Special Requests   Final    NONE Performed at [facility], [address], [place], [place] [address]    Gram Stain   Final    ABUNDANT WBC PRESENT,BOTH PMN AND MONONUCLEAR RARE GRAM POSITIVE COCCI    Culture   Final    FEW Consistent with normal respiratory flora. Performed at [facility], [place] [address], [place], [place] [address]    Report Status 03/26/2018 FINAL  Final  Body fluid culture     Status: None (Preliminary result)   Collection Time: 03/28/18  8:59 AM  Result Value Ref Range Status   Specimen Description PLEURAL  Final   Special Requests RIGHT  Final   Gram Stain   Final    MODERATE WBC PRESENT, PREDOMINANTLY PMN NO ORGANISMS SEEN    Culture   Final    NO GROWTH 2 DAYS Performed at [facility], [place] [address] [place], [place] [address]    Report Status PENDING  Incomplete  Aerobic/Anaerobic Culture (surgical/deep wound)     Status: None (Preliminary result)   Collection Time: 03/28/18  9:23 AM  Result Value Ref Range Status   Specimen Description LUNG RIGHT  Final   Special Requests PLEURAL PEEL  Final   Gram Stain   Final    FEW WBC PRESENT,BOTH PMN AND MONONUCLEAR NO ORGANISMS SEEN    Culture   Final    NO GROWTH 1 DAY Performed at [facility], [place] [address] [place] [address], [place], [place] [address]    Report Status PENDING  Incomplete      Studies: Dg Chest Port 1 View  Result Date: 03/30/2018 CLINICAL DATA:  Empyema, chest tube EXAM: PORTABLE CHEST 1 VIEW COMPARISON:  03/29/2018 FINDINGS: Left central line, right Port-A-Cath and right chest tubes remain in place, unchanged. There is a moderate right basilar and lateral pneumothorax, slightly increased in size since prior study.  Diffuse right lung airspace  disease. Increasing subcutaneous emphysema within the right chest wall. Left base opacity, likely atelectasis. Cardiomegaly. IMPRESSION: Increasing size of the right basilar and lateral pneumothorax with right chest tubes in stable position. Increasing subcutaneous emphysema in the right chest wall. Diffuse right lung airspace disease.  Left base atelectasis. Electronically Signed   By: Rolm Baptise M.D.   On: 03/30/2018 09:58    Scheduled Meds: . acetaminophen  1,000 mg Oral Q6H   Or  . acetaminophen (TYLENOL) oral liquid 160 mg/5 mL  1,000 mg Oral Q6H  . bisacodyl  10 mg Oral Daily  . enoxaparin (LOVENOX) injection  40 mg Subcutaneous Daily  . escitalopram  20 mg Oral Daily  . fentaNYL   Intravenous Q4H  . gabapentin  600 mg Oral BID  . mirtazapine  15 mg Oral QHS  . polyvinyl alcohol  2 drop Both Eyes Daily  . senna-docusate  1 tablet Oral QHS  . varenicline  0.5 mg Oral BID    Continuous Infusions: . sodium chloride 10 mL/hr at 03/29/18 2000  . piperacillin-tazobactam (ZOSYN)  IV Stopped (03/30/18 1105)  . potassium chloride    . vancomycin Stopped (03/30/18 0743)     LOS: 7 days     Annita Brod, MD [facility]  [email or web address] Password [facility] 03/30/2018, 12:34 PM

## 2018-03-31 ENCOUNTER — Inpatient Hospital Stay (HOSPITAL_COMMUNITY): Payer: Medicare Other

## 2018-03-31 LAB — BASIC METABOLIC PANEL
Anion gap: 7 (ref 5–15)
CALCIUM: 8 mg/dL — AB (ref 8.9–10.3)
CHLORIDE: 103 mmol/L (ref 101–111)
CO2: 30 mmol/L (ref 22–32)
Creatinine, Ser: 0.66 mg/dL (ref 0.61–1.24)
GFR calc non Af Amer: >60 mL/min (ref >60)
Glucose, Bld: 94 mg/dL (ref 65–99)
Potassium: 3.3 mmol/L — ABNORMAL LOW (ref 3.5–5.1)
SODIUM: 140 mmol/L (ref 135–145)

## 2018-03-31 LAB — CBC
HCT: 32.8 % — ABNORMAL LOW (ref 39.0–52.0)
HEMOGLOBIN: 10.3 g/dL — AB (ref 13.0–17.0)
MCH: 28.9 pg (ref 26.0–34.0)
MCHC: 31.4 g/dL (ref 30.0–36.0)
MCV: 92.1 fL (ref 78.0–100.0)
Platelets: 313 10*3/uL (ref 150–400)
RBC: 3.56 MIL/uL — ABNORMAL LOW (ref 4.22–5.81)
RDW: 16.5 % — ABNORMAL HIGH (ref 11.5–15.5)
WBC: 8.3 10*3/uL (ref 4.0–10.5)

## 2018-03-31 LAB — BODY FLUID CULTURE: CULTURE: NO GROWTH

## 2018-03-31 MED ORDER — POTASSIUM CHLORIDE 10 MEQ/50ML IV SOLN
10.0000 meq | INTRAVENOUS | Status: AC
  Administered 2018-03-31 (×2): 10 meq via INTRAVENOUS
  Filled 2018-03-31: qty 50

## 2018-03-31 MED ORDER — METHADONE HCL 10 MG PO TABS
20.0000 mg | ORAL_TABLET | Freq: Every day | ORAL | Status: DC
  Administered 2018-03-31 – 2018-04-08 (×39): 20 mg via ORAL
  Filled 2018-03-31 (×40): qty 2

## 2018-03-31 NOTE — Care Management Note (Signed)
Case Management Note Previous Note Created by  Marvetta Gibbons RN, BSN Unit 4E-Case Manager-- [place] coverage [phone]  Patient Details  Name: Erik [name] MRN: [id number] Date of Birth: [birth year]  Subjective/Objective:  Pt admitted with empyema of lung-(hx of COPD, acute on chronic resp. Failure, lung cancer) tx to 2H from 2W s/p VATS on 03/28/18                  Action/Plan: PTA pt lived at home-independent , has home 02, CM to follow post op for transition of care needs.   Expected Discharge Date:                  Expected Discharge Plan:  [place]  In-House Referral:     Discharge planning Services  CM Consult  Post Acute Care Choice:    Choice offered to:  Patient  DME Arranged:    DME Agency:     HH Arranged:    [place] Agency:     Status of Service:  In process, will continue to follow  If discussed at Long Length of Stay Meetings, dates discussed:    Discharge Disposition:   Additional Comments: 03/31/2018 Pt now on SD unit.  CM was informed by 2H unit CM that pt may be appropriate for [facility] referral next week depending upon progression - CM will continue to follow  Maryclare Labrador, RN 03/31/2018, 2:54 PM

## 2018-03-31 NOTE — Progress Notes (Signed)
D/C PCA pump and waste Fentanyl 6 ml, witness by [employer]. Pt has SOB after using BSC. Pt refused to ambulate today. Encouraged pt to walk tomorrow. HS [employer]

## 2018-03-31 NOTE — Progress Notes (Signed)
3 Days Post-Op Procedure(s) (LRB): VIDEO ASSISTED THORACOSCOPY (VATS)/EMPYEMA (Right) DECORTICATION (Right) Subjective: C/o back pain. Says fentanyl is ineffective  Objective: Vital signs in last 24 hours: Temp:  [97.4 F (36.3 C)-98.4 F (36.9 C)] 97.5 F (36.4 C) (04/26 0800) Pulse Rate:  [101-126] 110 (04/26 0800) Cardiac Rhythm: Sinus tachycardia (04/26 0748) Resp:  [9-25] 18 (04/26 0805) BP: (116-157)/(78-110) 142/96 (04/26 0800) SpO2:  [92 %-100 %] 99 % (04/26 0805)  Hemodynamic parameters for last 24 hours:    Intake/Output from previous day: 04/25 0701 - 04/26 0700 In: 1220 [P.O.:465; I.V.:255; IV Piggyback:500] Out: 2640 [Urine:2200; Chest Tube:440] Intake/Output this shift: No intake/output data recorded.  General appearance: alert, cooperative and mild distress Neurologic: intact Heart: tachy, regular Lungs: diminished breath sounds on right with CT sounds audible Wound: clean extreme tidal variation, no definite air leak  Lab Results: Recent Labs    03/30/18 0223 03/31/18 0544  WBC 9.7 8.3  HGB 11.0* 10.3*  HCT 34.9* 32.8*  PLT 311 313   BMET:  Recent Labs    03/30/18 0223 03/31/18 0544  NA 141 140  K 3.5 3.3*  CL 103 103  CO2 30 30  GLUCOSE 100* 94  BUN 6 <5*  CREATININE 0.54* 0.66  CALCIUM 8.2* 8.0*    PT/INR: No results for input(s): LABPROT, INR in the last 72 hours. ABG    Component Value Date/Time   PHART 7.460 (H) 03/29/2018 0308   HCO3 34.1 (H) 03/29/2018 0308   TCO2 36 (H) 03/29/2018 0308   ACIDBASEDEF 4.0 (H) 07/19/2016 1407   O2SAT 97.0 03/29/2018 0308   CBG (last 3)  Recent Labs    03/30/18 0004 03/30/18 0319 03/30/18 0748  GLUCAP 85 109* 124*    Assessment/Plan: S/P Procedure(s) (LRB): VIDEO ASSISTED THORACOSCOPY (VATS)/EMPYEMA (Right) DECORTICATION (Right) -CXR looks slightly better today- still has a significant space Pain issues- has been on methadone for 25 years and requesting to be on it instead of PCA-  will change accordingly Mobilize Hypokalemia- supplement Day 9 Vanco and zosyn, 3 days postop- will continue for 7 days postop   LOS: 8 days    [name] 03/31/2018

## 2018-03-31 NOTE — Progress Notes (Addendum)
PROGRESS NOTE  Erik [name] YFR:[id number] DOB: 10-26-1961 DOA: 03/23/2018 PCP: [name], MD  HPI/Recap of past 47 hours: 57 year old male with a history of COPD on 2 L oxygen, lung cancer, presented to [facility] with shortness of breath and fevers. 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 He was transferred to [facility] for further treatments. On intravenous antibiotics. Cardiothoracic surgery team proceeded with video-assisted thoracoscopy (VATS) with drainage of empyema and decortication on 4/23.   Patient is chronically on anticoagulation for pulmonary embolus diagnosed in 12/2017.    On IV heparin during procedure.  Eliquis restarted on 4/26.  No events overnight.  Patient still complains of pain, mostly in his lower back which is chronic.  States that breathing is a little bit easier.  Assessment/Plan: Principal Problem:   Empyema of lung (HCC) Active Problems:   Depression with anxiety   Adenocarcinoma of right lung (HCC)   COPD (chronic obstructive pulmonary disease) (HCC)   Essential hypertension   Acute on chronic respiratory failure with hypoxia (HCC)   Sepsis due to pneumonia (HCC)   History of pulmonary embolism   Empyema lung (HCC)   Malnutrition of moderate degree   Sepsis secondary to empyema in the setting of chronic effusion with prior radiation Met criteria for sepsis on admission given lactic acidosis, leukocytosis, tachycardia and hypotension.  Sepsis resolved.  Status post decortication with chest tube placement.  Chest tube continues to have good drainage.  Continue broad-spectrum IV antibiotics (at least for 4 more days as per CVTS).  Oxygen saturation stable.  Encouraging and spirometer use. Fluid culture no growth to date.   Continue Dulcolax and Senokot daily for bowel regimen-this has been working well with patient  Essential hypertension: Elevated blood pressures secondary to pain, tachycardic.   On low-dose Lopressor.  He may have had some mild withdrawals from methadone which has been restarted.  Chronic low back pain: Methadone restarted  Tobacco use disorder Continue Chantix for tobacco cessation  Anemia of chronic disease Hemoglobin remained stable. Serosanguineous fluid in the collector from chest tube  History of pulmonary embolism Restarted Eliquis on 4/26 morning Continue monitoring on telemetry Continue O2 supplementation to maintain O2 saturation 92% or greater.  Protein calorie malnutrition moderate: Patient meets criteria as related to chronic illness, cancer and cancer related treatments as evidenced by mild fat depletion, mild and moderate muscle depletion and edema.  Seen by nutrition.  On supplements and boost twice daily.   Code Status: Full code  Family Communication: Left message for sister  Disposition Plan: Continue in stepdown.  Monitor closely, slow improvement   Consultants:  Cardiothoracic surgery  Procedures:  VATS right-sided decortication from right-sided empyema  Antimicrobials:  IV vancomycin and IV Zosyn 4/18-present  DVT prophylaxis: SCDs, subcu Lovenox.   Objective: Vitals:   03/31/18 0500 03/31/18 0600 03/31/18 0800 03/31/18 0805  BP: 130/88 133/89 (!) 142/96   Pulse:  (!) 109 (!) 110   Resp: 15 15 14 18   Temp:   (!) 97.5 F (36.4 C)   TempSrc:   Oral   SpO2: 92% 92% 96% 99%  Weight:      Height:        Intake/Output Summary (Last 24 hours) at 03/31/2018 1041 Last data filed at 03/31/2018 0928 Gross per 24 hour  Intake 795 ml  Output 2370 ml  Net -1575 ml   Filed Weights   03/25/18 0500 03/26/18 0500 03/27/18 0500  Weight: 94.3 kg (207  lb 14.3 oz) 96.3 kg (212 lb 4.9 oz) 96.3 kg (212 lb 4.8 oz)    Exam:   General: Alert and oriented x3, mild distress secondary to pain  HEENT: Normocephalic atraumatic, mucous membranes slightly dry  Neck: Supple, no JVD  Cardiovascular: Regular rhythm,  tachycardic  Respiratory: Moderate inspiratory effort, decreased breath sounds on right at site of chest tube placement  Abdomen: Soft, nontender, nondistended, positive bowel sounds  Musculoskeletal: No clubbing or cyanosis, trace pitting edema  Neuro: No focal deficits  Skin: No skin breaks, tears or lesions  Psychiatry: Appropriate, no evidence of psychosis   Data Reviewed: CBC: Recent Labs  Lab 03/27/18 1848 03/28/18 0612 03/28/18 1121 03/29/18 0310 03/30/18 0223 03/31/18 0544  WBC 5.8 5.4  --  9.9 9.7 8.3  HGB 11.4* 12.0* 11.9* 10.7* 11.0* 10.3*  HCT 37.2* 39.1 35.0* 34.5* 34.9* 32.8*  MCV 96.4 96.8  --  93.0 92.6 92.1  PLT 239 257  --  299 311 409   Basic Metabolic Panel: Recent Labs  Lab 03/27/18 0620 03/27/18 1848 03/28/18 1121 03/29/18 0310 03/30/18 0223 03/31/18 0544  NA 137 137 139 139 141 140  K 3.6 4.0 4.8 3.8 3.5 3.3*  CL 99* 98*  --  101 103 103  CO2 32 31  --  30 30 30   GLUCOSE 88 130*  --  122* 100* 94  BUN <5* 5*  --  6 6 <5*  CREATININE 0.55* 0.70  --  0.58* 0.54* 0.66  CALCIUM 7.9* 8.0*  --  7.9* 8.2* 8.0*   GFR: Estimated Creatinine Clearance: 125.6 mL/min (by C-G formula based on SCr of 0.66 mg/dL). Liver Function Tests: Recent Labs  Lab 03/27/18 1848 03/30/18 0223  AST 49* 30  ALT 28 22  ALKPHOS 216* 156*  BILITOT 0.4 0.7  PROT 6.6 6.3*  ALBUMIN 1.7* 1.7*   No results for input(s): LIPASE, AMYLASE in the last 168 hours. No results for input(s): AMMONIA in the last 168 hours. Coagulation Profile: Recent Labs  Lab 03/27/18 1848  INR 1.19   Cardiac Enzymes: No results for input(s): CKTOTAL, CKMB, CKMBINDEX, TROPONINI in the last 168 hours. BNP (last 3 results) No results for input(s): PROBNP in the last 8760 hours. HbA1C: No results for input(s): HGBA1C in the last 72 hours. CBG: Recent Labs  Lab 03/29/18 1610 03/29/18 1950 03/30/18 0004 03/30/18 0319 03/30/18 0748  GLUCAP 100* 123* 85 109* 124*   Lipid  Profile: No results for input(s): CHOL, HDL, LDLCALC, TRIG, CHOLHDL, LDLDIRECT in the last 72 hours. Thyroid Function Tests: No results for input(s): TSH, T4TOTAL, FREET4, T3FREE, THYROIDAB in the last 72 hours. Anemia Panel: No results for input(s): VITAMINB12, FOLATE, FERRITIN, TIBC, IRON, RETICCTPCT in the last 72 hours. Urine analysis:    Component Value Date/Time   COLORURINE YELLOW 03/27/2018 1952   APPEARANCEUR CLEAR 03/27/2018 1952   LABSPEC 1.015 03/27/2018 1952   PHURINE 5.0 03/27/2018 1952   GLUCOSEU NEGATIVE 03/27/2018 1952   HGBUR NEGATIVE 03/27/2018 1952   BILIRUBINUR NEGATIVE 03/27/2018 [place] NEGATIVE 03/27/2018 1952   PROTEINUR NEGATIVE 03/27/2018 1952   NITRITE NEGATIVE 03/27/2018 1952   LEUKOCYTESUR NEGATIVE 03/27/2018 1952   Sepsis Labs: @LABRCNTIP (procalcitonin:4,lacticidven:4)  ) Recent Results (from the past 240 hour(s))  Culture, blood (Routine x 2)     Status: None   Collection Time: 03/23/18  7:23 PM  Result Value Ref Range Status   Specimen Description RIGHT ANTECUBITAL  Final   Special Requests   Final  BOTTLES DRAWN AEROBIC AND ANAEROBIC Blood Culture adequate volume   Culture   Final    NO GROWTH 5 DAYS Performed at [facility], [address] [place], [place] [address]    Report Status 03/28/2018 FINAL  Final  Culture, blood (Routine x 2)     Status: None   Collection Time: 03/23/18  7:23 PM  Result Value Ref Range Status   Specimen Description BLOOD RIGHT WRIST  Final   Special Requests   Final    BOTTLES DRAWN AEROBIC AND ANAEROBIC Blood Culture adequate volume   Culture   Final    NO GROWTH 5 DAYS Performed at [facility], [address] [place], [place] [address]    Report Status 03/28/2018 FINAL  Final  MRSA PCR Screening     Status: None   Collection Time: 03/24/18  3:36 PM  Result Value Ref Range Status   MRSA by PCR NEGATIVE NEGATIVE Final    Comment:        The GeneXpert MRSA Assay (FDA approved for NASAL  specimens only), is one component of a comprehensive MRSA colonization surveillance program. It is not intended to diagnose MRSA infection nor to guide or monitor treatment for MRSA infections. Performed at [facility], [address] [place], [place] [address]   Culture, respiratory (NON-Expectorated)     Status: None   Collection Time: 03/24/18  3:49 PM  Result Value Ref Range Status   Specimen Description   Final    EXPECTORATED SPUTUM Performed at [facility], [address], [place], [place] [address]    Special Requests   Final    NONE Performed at [facility], [address], [place], [place] [address]    Gram Stain   Final    ABUNDANT WBC PRESENT,BOTH PMN AND MONONUCLEAR RARE GRAM POSITIVE COCCI    Culture   Final    FEW Consistent with normal respiratory flora. Performed at [facility], [place] [address], [place], [place] [address]    Report Status 03/26/2018 FINAL  Final  Body fluid culture     Status: None   Collection Time: 03/28/18  8:59 AM  Result Value Ref Range Status   Specimen Description PLEURAL  Final   Special Requests RIGHT  Final   Gram Stain   Final    MODERATE WBC PRESENT, PREDOMINANTLY PMN NO ORGANISMS SEEN    Culture   Final    NO GROWTH 3 DAYS Performed at [facility], [place] [address] [place], [place] [address]    Report Status 03/31/2018 FINAL  Final  Aerobic/Anaerobic Culture (surgical/deep wound)     Status: None (Preliminary result)   Collection Time: 03/28/18  9:23 AM  Result Value Ref Range Status   Specimen Description LUNG RIGHT  Final   Special Requests PLEURAL PEEL  Final   Gram Stain   Final    FEW WBC PRESENT,BOTH PMN AND MONONUCLEAR NO ORGANISMS SEEN    Culture   Final    NO GROWTH 2 DAYS NO ANAEROBES ISOLATED; CULTURE IN PROGRESS FOR 5 DAYS Performed at [facility], [place] [address], [place],  [address]    Report Status PENDING  Incomplete      Studies: Dg Chest Port 1 View  Result Date:  03/31/2018 CLINICAL DATA:  Empyema, chest tube EXAM: PORTABLE CHEST 1 VIEW COMPARISON:  03/30/2018 FINDINGS: Right chest tube remains in place with small right effusion. Right basilar pneumothorax with mild improvement. Continued volume loss and  airspace disease throughout the right lung. Subcutaneous emphysema in the right chest wall unchanged. Mild left lower lobe atelectasis.  No effusion on the left. Right jugular Port-A-Cath tip in the right atrium unchanged. Left jugular central venous catheter tip proximal SVC unchanged. IMPRESSION: Right chest tube remains in place with mild improvement in right basilar pneumothorax. No change in right pleural and parenchymal disease Electronically Signed   By: Franchot Gallo M.D.   On: 03/31/2018 09:06    Scheduled Meds: . acetaminophen  1,000 mg Oral Q6H   Or  . acetaminophen (TYLENOL) oral liquid 160 mg/5 mL  1,000 mg Oral Q6H  . apixaban  5 mg Oral BID  . bisacodyl  10 mg Oral Daily  . Chlorhexidine Gluconate Cloth  6 each Topical Daily  . escitalopram  20 mg Oral Daily  . gabapentin  600 mg Oral BID  . lactose free nutrition  237 mL Oral BID BM  . methadone  20 mg Oral 5 X Daily  . mirtazapine  15 mg Oral QHS  . polyvinyl alcohol  2 drop Both Eyes Daily  . senna-docusate  1 tablet Oral QHS  . sodium chloride flush  10-40 mL Intracatheter Q12H  . varenicline  0.5 mg Oral BID    Continuous Infusions: . sodium chloride 10 mL/hr at 03/29/18 2000  . piperacillin-tazobactam (ZOSYN)  IV Stopped (03/31/18 0149)  . potassium chloride Stopped (03/31/18 0914)  . potassium chloride 10 mEq (03/31/18 0948)  . vancomycin 1,000 mg (03/31/18 0527)     LOS: 8 days     Annita Brod, MD [facility]  [email or web address] Password [facility] 03/31/2018, 10:41 AM

## 2018-04-01 ENCOUNTER — Inpatient Hospital Stay (HOSPITAL_COMMUNITY): Payer: Medicare Other

## 2018-04-01 LAB — CBC
HEMATOCRIT: 34.2 % — AB (ref 39.0–52.0)
HEMOGLOBIN: 10.5 g/dL — AB (ref 13.0–17.0)
MCH: 28.8 pg (ref 26.0–34.0)
MCHC: 30.7 g/dL (ref 30.0–36.0)
MCV: 93.7 fL (ref 78.0–100.0)
Platelets: 312 10*3/uL (ref 150–400)
RBC: 3.65 MIL/uL — ABNORMAL LOW (ref 4.22–5.81)
RDW: 16.8 % — ABNORMAL HIGH (ref 11.5–15.5)
WBC: 7.7 10*3/uL (ref 4.0–10.5)

## 2018-04-01 LAB — BASIC METABOLIC PANEL
ANION GAP: 4 — AB (ref 5–15)
BUN: 6 mg/dL (ref 6–20)
CHLORIDE: 105 mmol/L (ref 101–111)
CO2: 32 mmol/L (ref 22–32)
Calcium: 8.2 mg/dL — ABNORMAL LOW (ref 8.9–10.3)
Creatinine, Ser: 0.69 mg/dL (ref 0.61–1.24)
GFR calc non Af Amer: >60 mL/min (ref >60)
Glucose, Bld: 77 mg/dL (ref 65–99)
Potassium: 3.8 mmol/L (ref 3.5–5.1)
Sodium: 141 mmol/L (ref 135–145)

## 2018-04-01 MED ORDER — SALINE SPRAY 0.65 % NA SOLN
1.0000 | NASAL | Status: DC | PRN
  Administered 2018-04-01 – 2018-04-03 (×2): 1 via NASAL
  Filled 2018-04-01: qty 44

## 2018-04-01 MED ORDER — GUAIFENESIN ER 600 MG PO TB12
1200.0000 mg | ORAL_TABLET | Freq: Two times a day (BID) | ORAL | Status: DC
  Administered 2018-04-01 – 2018-04-08 (×15): 1200 mg via ORAL
  Filled 2018-04-01 (×15): qty 2

## 2018-04-01 NOTE — Evaluation (Signed)
Physical Therapy Evaluation Patient Details Name: Erik [name] MRN: [id number] DOB: Jan 13, 1961 Today's Date: 04/01/2018   History of Present Illness  57 yo admitted with SOB and Rt empyema s/p VATS 4/23 with sepsis acutely. PMhx: COPD on home O2, lung CA, HTN  Clinical Impression  Pt pleasant, sitting in chair and agreeable to gait. Pt on 2L O2 at home, on 4L acutely with SpO2 maintaining 94-97% with activity, HR 110-126. Pt with decreased activity tolerance and functional mobility who will benefit from acute therapy to increase independence and gait to maximize function. Pt encouraged to walk daily with staff.      Follow Up Recommendations No PT follow up    Equipment Recommendations  None recommended by PT    Recommendations for Other Services       Precautions / Restrictions Precautions Precautions: Fall      Mobility  Bed Mobility               General bed mobility comments: in chair on arrival  Transfers Overall transfer level: Modified independent                  Ambulation/Gait Ambulation/Gait assistance: Min guard Ambulation Distance (Feet): 200 Feet Assistive device: None Gait Pattern/deviations: Step-through pattern;Decreased stride length   Gait velocity interpretation: <1.8 ft/sec, indicate of risk for recurrent falls General Gait Details: pt with 2 partial LOB with tactile cues to correct and limited by fatigue with gait, slow gait. One standing rest to recover during gait  Stairs            Wheelchair Mobility    Modified Rankin (Stroke Patients Only)       Balance Overall balance assessment: Needs assistance   Sitting balance-Leahy Scale: Good       Standing balance-Leahy Scale: Good                               Pertinent Vitals/Pain Pain Assessment: No/denies pain    Home Living Family/patient expects to be discharged to:: Private residence Living Arrangements: Parent Available Help at Discharge:  Family;Available PRN/intermittently Type of Home: House Home Access: Stairs to enter   Entrance Stairs-Number of Steps: 3 Home Layout: One level Home Equipment: Walker - 2 wheels;Cane - single point      Prior Function Level of Independence: Needs assistance   Gait / Transfers Assistance Needed: uses cane periodically  ADL's / Homemaking Assistance Needed: pt states he performs bathing and dressing, eats out or sister cooks, sister does the house work        Journalist, newspaper        Extremity/Trunk Assessment   Upper Extremity Assessment Upper Extremity Assessment: Overall WFL for tasks assessed    Lower Extremity Assessment Lower Extremity Assessment: Overall WFL for tasks assessed    Cervical / Trunk Assessment Cervical / Trunk Assessment: Normal  Communication   Communication: No difficulties  Cognition Arousal/Alertness: Awake/alert Behavior During Therapy: WFL for tasks assessed/performed Overall Cognitive Status: Within Functional Limits for tasks assessed                                        General Comments      Exercises     Assessment/Plan    PT Assessment Patient needs continued PT services  PT Problem List Decreased mobility;Decreased activity tolerance;Decreased balance  PT Treatment Interventions Gait training;Patient/family education;Therapeutic exercise;Functional mobility training;Balance training;Neuromuscular re-education;Therapeutic activities    PT Goals (Current goals can be found in the Care Plan section)  Acute Rehab PT Goals Patient Stated Goal: return home PT Goal Formulation: With patient Time For Goal Achievement: 04/15/18 Potential to Achieve Goals: Good    Frequency Min 3X/week   Barriers to discharge Decreased caregiver support      Co-evaluation               AM-PAC PT "6 Clicks" Daily Activity  Outcome Measure Difficulty turning over in bed (including adjusting bedclothes, sheets and  blankets)?: A Little Difficulty moving from lying on back to sitting on the side of the bed? : A Little Difficulty sitting down on and standing up from a chair with arms (e.g., wheelchair, bedside commode, etc,.)?: A Little Help needed moving to and from a bed to chair (including a wheelchair)?: None Help needed walking in hospital room?: A Little Help needed climbing 3-5 steps with a railing? : A Little 6 Click Score: 19    End of Session Equipment Utilized During Treatment: Oxygen Activity Tolerance: Patient tolerated treatment well Patient left: in chair;with call bell/phone within reach Nurse Communication: Mobility status PT Visit Diagnosis: Other abnormalities of gait and mobility (R26.89)    Time: 1410-3013 PT Time Calculation (min) (ACUTE ONLY): 18 min   Charges:   PT Evaluation $PT Eval Moderate Complexity: 1 Mod     PT G Codes:        Elwyn Reach, PT [phone]   Shaya Altamura B Macaila Tahir 04/01/2018, 2:00 PM

## 2018-04-01 NOTE — Progress Notes (Signed)
PROGRESS NOTE    Erik [name]  JSR:[id number] DOB: 12-Nov-1961 DOA: 03/23/2018 PCP: [name], MD  Brief Narrative: 57 year old male with a history of COPD on 2 L oxygen, lung cancer, presented to [facility] with shortness of breath and fevers. 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 He was transferred to [facility] for further treatments. On intravenous antibiotics. Cardiothoracic surgeryteam proceeded with video-assisted thoracoscopy (VATS) with drainage of empyema and decortication on 4/23.Patientis chronically on anticoagulation for pulmonary embolus diagnosed in 12/2017.   On IV heparin during procedure.  Eliquis restarted on 4/26.  No events overnight.  Patient still complains of pain, mostly in his lower back which is chronic.  States that breathing is a little bit easier.     Assessment & Plan:   Principal Problem:   Empyema of lung ([place]) Active Problems:   Depression with anxiety   Adenocarcinoma of right lung (HCC)   COPD (chronic obstructive pulmonary disease) ([place])   Essential hypertension   Acute on chronic respiratory failure with hypoxia (HCC)   Sepsis due to pneumonia (HCC)   History of pulmonary embolism   Empyema lung (HCC)   Malnutrition of moderate degree Sepsis secondary to empyema in the setting of chronic effusion with prior radiation Met criteria for sepsis on admission given lactic acidosis, leukocytosis, tachycardia and hypotension.  Sepsis resolved.  Status post decortication with chest tube placement.  Chest tube continues to have good drainage.  Continue broad-spectrum IV antibiotics (at least for 3 more days as per CVTS).  Oxygen saturation stable.  Encouraging and spirometer use. Fluid culture no growth to date.   Continue Dulcolax and Senokot daily for bowel regimen-this has been working well with patient PT Consult  Essential hypertension: Elevated blood pressures secondary to pain,  tachycardic.  On low-dose Lopressor.  He may have had some mild withdrawals from methadone which has been restarted.  Chronic low back pain: Methadone restarted  Tobacco use disorder Continue Chantix for tobacco cessation  Anemia of chronic disease Hemoglobin remained stable. Serosanguineous fluid in the collector from chest tube    History of pulmonary embolism Restarted Eliquis on 4/26 morning Continue monitoring on telemetry Continue O2 supplementation to maintain O2 saturation 92% or greater.  Protein calorie malnutrition moderate: Patient meets criteria as related to chronic illness, cancer and cancer related treatments as evidenced by mild fat depletion, mild and moderate muscle depletion and edema.  Seen by nutrition.  On supplements and boost twice daily.       DVT prophylaxis: Eliquis and SCD Code Status: Full code Family Communication: No family available Disposition Plan: To be determined  Consultants:  Cardiothoracic surgery Procedures:Procedures:  VATS right-sided decortication from right-sided empyema   Antimicrobials Vanco and Zosyn started 418 Subjective:  Doing better denies any new complaints breathing better chest tube in place. Objective: Vitals:   03/31/18 2100 03/31/18 2200 03/31/18 2328 04/01/18 0312  BP: 126/76 126/88 118/79 109/78  Pulse: (!) 109 (!) 105 (!) 104 (!) 106  Resp: 13 12 13 12   Temp:   (!) 97.4 F (36.3 C) 97.8 F (36.6 C)  TempSrc:   Oral Oral  SpO2: 97% 97% 97% 93%  Weight:      Height:        Intake/Output Summary (Last 24 hours) at 04/01/2018 1109 Last data filed at 04/01/2018 0910 Gross per 24 hour  Intake 1644 ml  Output 1225 ml  Net 419 ml   Filed Weights   03/25/18  0500 03/26/18 0500 03/27/18 0500  Weight: 94.3 kg (207 lb 14.3 oz) 96.3 kg (212 lb 4.9 oz) 96.3 kg (212 lb 4.8 oz)    Examination:  General exam: Appears calm and comfortable  Respiratory system: Scattered rhonchi to auscultation.  Respiratory effort normal. Cardiovascular system: S1 & S2 heard, RRR. No JVD, murmurs, rubs, gallops or clicks. No pedal edema. Gastrointestinal system: Abdomen is nondistended, soft and nontender. No organomegaly or masses felt. Normal bowel sounds heard. Central nervous system: Alert and oriented. No focal neurological deficits. Extremities: Symmetric 5 x 5 power. Skin: No rashes, lesions or ulcers Psychiatry: Judgement and insight appear normal. Mood & affect appropriate.     Data Reviewed: I have personally reviewed following labs and imaging studies  CBC: Recent Labs  Lab 03/28/18 0612 03/28/18 1121 03/29/18 0310 03/30/18 0223 03/31/18 0544 04/01/18 0320  WBC 5.4  --  9.9 9.7 8.3 7.7  HGB 12.0* 11.9* 10.7* 11.0* 10.3* 10.5*  HCT 39.1 35.0* 34.5* 34.9* 32.8* 34.2*  MCV 96.8  --  93.0 92.6 92.1 93.7  PLT 257  --  299 311 313 390   Basic Metabolic Panel: Recent Labs  Lab 03/27/18 1848 03/28/18 1121 03/29/18 0310 03/30/18 0223 03/31/18 0544 04/01/18 0320  NA 137 139 139 141 140 141  K 4.0 4.8 3.8 3.5 3.3* 3.8  CL 98*  --  101 103 103 105  CO2 31  --  30 30 30  32  GLUCOSE 130*  --  122* 100* 94 77  BUN 5*  --  6 6 <5* 6  CREATININE 0.70  --  0.58* 0.54* 0.66 0.69  CALCIUM 8.0*  --  7.9* 8.2* 8.0* 8.2*   GFR: Estimated Creatinine Clearance: 125.6 mL/min (by C-G formula based on SCr of 0.69 mg/dL). Liver Function Tests: Recent Labs  Lab 03/27/18 1848 03/30/18 0223  AST 49* 30  ALT 28 22  ALKPHOS 216* 156*  BILITOT 0.4 0.7  PROT 6.6 6.3*  ALBUMIN 1.7* 1.7*   No results for input(s): LIPASE, AMYLASE in the last 168 hours. No results for input(s): AMMONIA in the last 168 hours. Coagulation Profile: Recent Labs  Lab 03/27/18 1848  INR 1.19   Cardiac Enzymes: No results for input(s): CKTOTAL, CKMB, CKMBINDEX, TROPONINI in the last 168 hours. BNP (last 3 results) No results for input(s): PROBNP in the last 8760 hours. HbA1C: No results for input(s):  HGBA1C in the last 72 hours. CBG: Recent Labs  Lab 03/29/18 1610 03/29/18 1950 03/30/18 0004 03/30/18 0319 03/30/18 0748  GLUCAP 100* 123* 85 109* 124*   Lipid Profile: No results for input(s): CHOL, HDL, LDLCALC, TRIG, CHOLHDL, LDLDIRECT in the last 72 hours. Thyroid Function Tests: No results for input(s): TSH, T4TOTAL, FREET4, T3FREE, THYROIDAB in the last 72 hours. Anemia Panel: No results for input(s): VITAMINB12, FOLATE, FERRITIN, TIBC, IRON, RETICCTPCT in the last 72 hours. Sepsis Labs: No results for input(s): PROCALCITON, LATICACIDVEN in the last 168 hours.  Recent Results (from the past 240 hour(s))  Culture, blood (Routine x 2)     Status: None   Collection Time: 03/23/18  7:23 PM  Result Value Ref Range Status   Specimen Description RIGHT ANTECUBITAL  Final   Special Requests   Final    BOTTLES DRAWN AEROBIC AND ANAEROBIC Blood Culture adequate volume   Culture   Final    NO GROWTH 5 DAYS Performed at [facility], [address] [place],  [address]    Report Status 03/28/2018 FINAL  Final  Culture, blood (Routine x 2)     Status: None   Collection Time: 03/23/18  7:23 PM  Result Value Ref Range Status   Specimen Description BLOOD RIGHT WRIST  Final   Special Requests   Final    BOTTLES DRAWN AEROBIC AND ANAEROBIC Blood Culture adequate volume   Culture   Final    NO GROWTH 5 DAYS Performed at [facility], [address] [place], [place] [address]    Report Status 03/28/2018 FINAL  Final  MRSA PCR Screening     Status: None   Collection Time: 03/24/18  3:36 PM  Result Value Ref Range Status   MRSA by PCR NEGATIVE NEGATIVE Final    Comment:        The GeneXpert MRSA Assay (FDA approved for NASAL specimens only), is one component of a comprehensive MRSA colonization surveillance program. It is not intended to diagnose MRSA infection nor to guide or monitor treatment for MRSA infections. Performed at [facility], [address]  [place], [place] [address]   Culture, respiratory (NON-Expectorated)     Status: None   Collection Time: 03/24/18  3:49 PM  Result Value Ref Range Status   Specimen Description   Final    EXPECTORATED SPUTUM Performed at [facility], [address] [place], [place] [address]    Special Requests   Final    NONE Performed at [facility], [address] [place], [place] [address]    Gram Stain   Final    ABUNDANT WBC PRESENT,BOTH PMN AND MONONUCLEAR RARE GRAM POSITIVE COCCI    Culture   Final    FEW Consistent with normal respiratory flora. Performed at [facility], [place] [address] [place], [place] [address]    Report Status 03/26/2018 FINAL  Final  Body fluid culture     Status: None   Collection Time: 03/28/18  8:59 AM  Result Value Ref Range Status   Specimen Description PLEURAL  Final   Special Requests RIGHT  Final   Gram Stain   Final    MODERATE WBC PRESENT, PREDOMINANTLY PMN NO ORGANISMS SEEN    Culture   Final    NO GROWTH 3 DAYS Performed at [facility], [place] [address] [place], [place] [address]    Report Status 03/31/2018 FINAL  Final  Aerobic/Anaerobic Culture (surgical/deep wound)     Status: None (Preliminary result)   Collection Time: 03/28/18  9:23 AM  Result Value Ref Range Status   Specimen Description LUNG RIGHT  Final   Special Requests PLEURAL PEEL  Final   Gram Stain   Final    FEW WBC PRESENT,BOTH PMN AND MONONUCLEAR NO ORGANISMS SEEN    Culture   Final    NO GROWTH 3 DAYS NO ANAEROBES ISOLATED; CULTURE IN PROGRESS FOR 5 DAYS Performed at [facility], [place] [address], [place], [place] [address]    Report Status PENDING  Incomplete         Radiology Studies: Dg Chest Port 1 View  Result Date: 03/31/2018 CLINICAL DATA:  Empyema, chest tube EXAM: PORTABLE CHEST 1 VIEW COMPARISON:  03/30/2018 FINDINGS: Right chest tube remains in place with small right effusion. Right basilar pneumothorax with mild improvement. Continued  volume loss and airspace disease throughout the right lung. Subcutaneous emphysema in the right chest wall unchanged. Mild left lower lobe atelectasis.  No effusion on the left. Right jugular Port-A-Cath tip in the right atrium unchanged. Left jugular central venous catheter  tip proximal SVC unchanged. IMPRESSION: Right chest tube remains in place with mild improvement in right basilar pneumothorax. No change in right pleural and parenchymal disease Electronically Signed   By: Franchot Gallo M.D.   On: 03/31/2018 09:06        Scheduled Meds: . acetaminophen  1,000 mg Oral Q6H   Or  . acetaminophen (TYLENOL) oral liquid 160 mg/5 mL  1,000 mg Oral Q6H  . apixaban  5 mg Oral BID  . Chlorhexidine Gluconate Cloth  6 each Topical Daily  . escitalopram  20 mg Oral Daily  . gabapentin  600 mg Oral BID  . guaiFENesin  1,200 mg Oral BID  . lactose free nutrition  237 mL Oral BID BM  . methadone  20 mg Oral 5 X Daily  . mirtazapine  15 mg Oral QHS  . polyvinyl alcohol  2 drop Both Eyes Daily  . sodium chloride flush  10-40 mL Intracatheter Q12H  . varenicline  0.5 mg Oral BID   Continuous Infusions: . sodium chloride 10 mL/hr at 03/29/18 2000  . piperacillin-tazobactam (ZOSYN)  IV 3.375 g (04/01/18 0506)  . potassium chloride Stopped (03/31/18 0914)  . vancomycin 1,000 mg (04/01/18 0506)     LOS: 9 days   Georgette Shell, MD [facility] If 7PM-7AM, please contact night-coverage [email or web address] Password [facility] 04/01/2018, 11:09 AM

## 2018-04-01 NOTE — Progress Notes (Addendum)
      [address]       [place] [address]             [phone]      4 Days Post-Op Procedure(s) (LRB): VIDEO ASSISTED THORACOSCOPY (VATS)/EMPYEMA (Right) DECORTICATION (Right) Subjective: Feels okay. Nose is stuffy so I ordered ocean nasal spray. On the toilet and having diarrhea.   Objective: Vital signs in last 24 hours: Temp:  [97.2 F (36.2 C)-98.1 F (36.7 C)] 97.8 F (36.6 C) (04/27 0312) Pulse Rate:  [96-131] 106 (04/27 0312) Cardiac Rhythm: Sinus tachycardia (04/27 0700) Resp:  [12-29] 12 (04/27 0312) BP: (109-145)/(76-103) 109/78 (04/27 0312) SpO2:  [92 %-99 %] 93 % (04/27 0312)     Intake/Output from previous day: 04/26 0701 - 04/27 0700 In: 1794 [P.O.:944; IV Piggyback:850] Out: 1050 [Urine:750; Chest Tube:300] Intake/Output this shift: No intake/output data recorded.  General appearance: alert, cooperative and no distress Heart: sinus tachycardia Lungs: rhonchi in all fields Abdomen: soft, non-tender; bowel sounds normal; no masses,  no organomegaly Extremities: extremities normal, atraumatic, no cyanosis or edema Wound: clean and dry, staples in place  Lab Results: Recent Labs    03/31/18 0544 04/01/18 0320  WBC 8.3 7.7  HGB 10.3* 10.5*  HCT 32.8* 34.2*  PLT 313 312   BMET:  Recent Labs    03/31/18 0544 04/01/18 0320  NA 140 141  K 3.3* 3.8  CL 103 105  CO2 30 32  GLUCOSE 94 77  BUN <5* 6  CREATININE 0.66 0.69  CALCIUM 8.0* 8.2*    PT/INR: No results for input(s): LABPROT, INR in the last 72 hours. ABG    Component Value Date/Time   PHART 7.460 (H) 03/29/2018 0308   HCO3 34.1 (H) 03/29/2018 0308   TCO2 36 (H) 03/29/2018 0308   ACIDBASEDEF 4.0 (H) 07/19/2016 1407   O2SAT 97.0 03/29/2018 0308   CBG (last 3)  Recent Labs    03/30/18 0004 03/30/18 0319 03/30/18 0748  GLUCAP 85 109* 124*    Assessment/Plan: S/P Procedure(s) (LRB): VIDEO ASSISTED THORACOSCOPY (VATS)/EMPYEMA (Right) DECORTICATION  (Right)  1. CV-ST rate is low 100s. BP well controlled. Continue Eliquis 2. Pulm-CXR looks stable this morning. On 4 L . Will await the official read. Continue chest tubes. 350ml/24 hours drainage. ++ air leak and ++ fluid wave. On Water seal.  3. Renal-creatinine 0.69, electrolytes okay.  4. H and H stable. 10.5/34.2, platelets stable 5. Remains on Vanco and Zosyn day 10, 4 days post-op. Continue for 3 more days.  6. Chronic pain. On Methadone chronically.  7. Diarrhea-stop stool softeners.  8. Cough- congestion difficult to cough up, will start mucinex   Plan: Continue ambulation. Encouraged incentive spirometer. Try to wean oxygen as tolerated. Continue chest tubes. CXR Tomorrow.    LOS: 9 days    Erik [name] 04/01/2018  No results found. Large  clear, movement, with strong cough chest tube to stay chest xray without report yet unchanged from yesterday  I have seen and examined Erik [name] and agree with the above assessment  and plan.  Erik Isaac MD Beeper [phone] Office [phone] 04/01/2018 10:48 AM

## 2018-04-02 ENCOUNTER — Inpatient Hospital Stay (HOSPITAL_COMMUNITY): Payer: Medicare Other

## 2018-04-02 LAB — C DIFFICILE QUICK SCREEN W PCR REFLEX
C DIFFICILE (CDIFF) INTERP: NOT DETECTED
C DIFFICLE (CDIFF) ANTIGEN: NEGATIVE
C Diff toxin: NEGATIVE

## 2018-04-02 LAB — AEROBIC/ANAEROBIC CULTURE W GRAM STAIN (SURGICAL/DEEP WOUND)

## 2018-04-02 LAB — AEROBIC/ANAEROBIC CULTURE (SURGICAL/DEEP WOUND): CULTURE: NO GROWTH

## 2018-04-02 MED ORDER — LOPERAMIDE HCL 2 MG PO CAPS
2.0000 mg | ORAL_CAPSULE | ORAL | Status: DC | PRN
  Administered 2018-04-03 – 2018-04-06 (×5): 2 mg via ORAL
  Filled 2018-04-02 (×5): qty 1

## 2018-04-02 NOTE — Progress Notes (Addendum)
      [address]       [place] [address]             [phone]      5 Days Post-Op Procedure(s) (LRB): VIDEO ASSISTED THORACOSCOPY (VATS)/EMPYEMA (Right) DECORTICATION (Right) Subjective: On the commode this morning again. Still having lots of diarrhea despite discontinuing stool softeners.     Objective: Vital signs in last 24 hours: Temp:  [97.7 F (36.5 C)-98.7 F (37.1 C)] 97.8 F (36.6 C) (04/28 0825) Pulse Rate:  [103-120] 103 (04/28 0825) Cardiac Rhythm: Sinus tachycardia (04/28 0832) Resp:  [12-20] 14 (04/28 0825) BP: (113-140)/(79-94) 140/85 (04/28 0825) SpO2:  [95 %-99 %] 98 % (04/28 0825)     Intake/Output from previous day: 04/27 0701 - 04/28 0700 In: 850 [P.O.:350; IV Piggyback:500] Out: 1150 [Urine:750; Chest Tube:400] Intake/Output this shift: Total I/O In: 222 [P.O.:222] Out: 770 [Urine:650; Chest Tube:120]  General appearance: alert, cooperative and no distress Heart: regular rate and rhythm, S1, S2 normal, no murmur, click, rub or gallop Lungs: clear to auscultation bilaterally Abdomen: soft, non-tender; bowel sounds normal; no masses,  no organomegaly Extremities: 1-2+ pitting pedal edema Wound: clean and dry  Lab Results: Recent Labs    03/31/18 0544 04/01/18 0320  WBC 8.3 7.7  HGB 10.3* 10.5*  HCT 32.8* 34.2*  PLT 313 312   BMET:  Recent Labs    03/31/18 0544 04/01/18 0320  NA 140 141  K 3.3* 3.8  CL 103 105  CO2 30 32  GLUCOSE 94 77  BUN <5* 6  CREATININE 0.66 0.69  CALCIUM 8.0* 8.2*    PT/INR: No results for input(s): LABPROT, INR in the last 72 hours. ABG    Component Value Date/Time   PHART 7.460 (H) 03/29/2018 0308   HCO3 34.1 (H) 03/29/2018 0308   TCO2 36 (H) 03/29/2018 0308   ACIDBASEDEF 4.0 (H) 07/19/2016 1407   O2SAT 97.0 03/29/2018 0308   CBG (last 3)  No results for input(s): GLUCAP in the last 72 hours.  Assessment/Plan: S/P Procedure(s) (LRB): VIDEO ASSISTED THORACOSCOPY  (VATS)/EMPYEMA (Right) DECORTICATION (Right)  1. CV-ST rate is low 100s. BP well controlled. Continue Eliquis 2. Pulm-CXR looks stable this morning, diffuse right lung volume loss with small hydropneumothorax, no acute findings. On 5 L [place]. Continue chest tubes. 114ml/24 hours drainage. ++ air leak and ++ fluid wave. On Water seal.  3. Renal-creatinine 0.69, electrolytes okay.  4. H and H stable. 10.5/34.2, platelets stable 5. Remains on Vanco and Zosyn day 11, 5 days post-op. Continue for 2 more days.  6. Chronic pain. On Methadone chronically.  7. Diarrhea-stop stool softeners. Possibly from the Vanco.  8. Cough- congestion difficult to cough up, on mucinex, which is helping  Plan: Test for CDiff. Imodium for diarrhea. Continue incentive spirometer. Ambulate in the halls. Remains on water seal with large air leak. CXR stable.    LOS: 10 days    [name] 04/02/2018  Off stool softeners now I have seen and examined Lazarus Gowda and agree with the above assessment  and plan.  Grace Isaac MD Beeper [phone] Office [phone] 04/02/2018 11:42 AM

## 2018-04-02 NOTE — Plan of Care (Signed)
Patient continues to progress toward care goals; activity tolerance is also improving as patient ambulates approx 200 ft w/SBA x 1.  O2 remains at 4-5L/[place] to maintain sats.  Pain is controlled w/scheduled Methadone and PRN Oxy.  CT to right lateral chest wall remains intact w/mild to moderate air leak - physicians aware.  Continue to monitor.

## 2018-04-02 NOTE — Progress Notes (Signed)
PROGRESS NOTE    Erik [name]  ZOX:[id number] DOB: 1961/08/19 DOA: 03/23/2018 PCP: [name], MD  Brief Narrative:  57 year old male with a history of COPD on 2 L oxygen, lung cancer, presented to [facility] with shortness of breath and fevers. 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 He was transferred to [facility] for further treatments. On intravenous antibiotics. Cardiothoracic surgeryteam proceeded with video-assisted thoracoscopy (VATS) with drainage of empyema and decortication on 4/23.Patientis chronically on anticoagulation for pulmonary embolus diagnosed in 12/2017. On IV heparin during procedure. Eliquis restarted on 4/26.  No events overnight. Patient still complains of pain, mostly in his lower back which is chronic. States that breathing is a little bit easier.     Assessment & Plan:   Principal Problem:   Empyema of lung ([place]) Active Problems:   Depression with anxiety   Adenocarcinoma of right lung (HCC)   COPD (chronic obstructive pulmonary disease) ([place])   Essential hypertension   Acute on chronic respiratory failure with hypoxia (HCC)   Sepsis due to pneumonia (HCC)   History of pulmonary embolism   Empyema lung (HCC)   Malnutrition of moderate degree  Sepsis secondary to empyema in the setting of chronic effusion with prior radiation Met criteria for sepsis on admission given lactic acidosis, leukocytosis, tachycardia and hypotension. Sepsis resolved. Status post decortication with chest tube placement. Chest tube continues to have good drainage. Continue broad-spectrum IV antibiotics(at least for 2 more days as per CVTS). Oxygen saturation stable. Encouraging  spirometer use.CXR done results pending. Fluid cultureno growth to date.  PT Consult noted.  Essential hypertension: Elevated blood pressures secondary to pain, tachycardic.On low-dose Lopressor. He may have had some mild  withdrawals from methadone which has been restarted.  Chronic low back pain: Methadone restarted  Tobacco use disorder Continue Chantix for tobacco cessation  Anemia of chronic disease Hemoglobin remained stable. Serosanguineous fluid in the collector from chest tube  History of pulmonary embolism Restarted Eliquis on 4/26 morning Continue monitoring on telemetry Continue O2 supplementation to maintain O2 saturation 92% or greater.  Protein calorie malnutrition moderate:Patient meets criteria as related to chronic illness, cancer and cancer related treatments as evidenced by mild fat depletion, mild and moderate muscle depletion and edema. Seen by nutrition. On supplements and boost twice daily.       DVT prophylaxis:eliquis scd Code Status:full Family Communication:none Disposition Plan:tbd Consultants: ct surgery   Procedures:VATS right-sided decortication from right-sided empyema    Antimicrobials:Vanco and Zosyn started 418    Subjective no further diarrhea.  Objective: Vitals:   04/01/18 2010 04/01/18 2251 04/02/18 0338 04/02/18 0825  BP: 120/90 120/88 113/79 140/85  Pulse: (!) 114 (!) 108 (!) 106 (!) 103  Resp: _0 Temp: 97.8 F (36.6 C) 98.4 F (36.9 C) 98.3 F (36.8 C) 97.8 F (36.6 C)  TempSrc: Oral Oral Oral Oral  SpO2: 99% 98% 98% 98%  Weight:      Height:        Intake/Output Summary (Last 24 hours) at 04/02/2018 0950 Last data filed at 04/02/2018 8119 Gross per 24 hour  Intake 722 ml  Output 1545 ml  Net -823 ml   Filed Weights   03/25/18 0500 03/26/18 0500 03/27/18 0500  Weight: 94.3 kg (207 lb 14.3 oz) 96.3 kg (212 lb 4.9 oz) 96.3 kg (212 lb 4.8 oz)    Examination:  General exam: Appears calm and comfortable  Respiratory system: diminished breath sounds to  auscultation. Respiratory effort normal. Cardiovascular system: S1 & S2 heard, RRR. No JVD, murmurs, rubs, gallops or clicks. No pedal  edema. Gastrointestinal system: Abdomen is nondistended, soft and nontender. No organomegaly or masses felt. Normal bowel sounds heard. Central nervous system: Alert and oriented. No focal neurological deficits. Extremities: Symmetric 5 x 5 power. Skin: No rashes, lesions or ulcers Psychiatry: Judgement and insight appear normal. Mood & affect appropriate.     Data Reviewed: I have personally reviewed following labs and imaging studies  CBC: Recent Labs  Lab 03/28/18 0612 03/28/18 1121 03/29/18 0310 03/30/18 0223 03/31/18 0544 04/01/18 0320  WBC 5.4  --  9.9 9.7 8.3 7.7  HGB 12.0* 11.9* 10.7* 11.0* 10.3* 10.5*  HCT 39.1 35.0* 34.5* 34.9* 32.8* 34.2*  MCV 96.8  --  93.0 92.6 92.1 93.7  PLT 257  --  299 311 313 062   Basic Metabolic Panel: Recent Labs  Lab 03/27/18 1848 03/28/18 1121 03/29/18 0310 03/30/18 0223 03/31/18 0544 04/01/18 0320  NA 137 139 139 141 140 141  K 4.0 4.8 3.8 3.5 3.3* 3.8  CL 98*  --  101 103 103 105  CO2 31  --  _0 32  GLUCOSE 130*  --  122* 100* 94 77  BUN 5*  --  6 6 <5* 6  CREATININE 0.70  --  0.58* 0.54* 0.66 0.69  CALCIUM 8.0*  --  7.9* 8.2* 8.0* 8.2*   GFR: Estimated Creatinine Clearance: 125.6 mL/min (by C-G formula based on SCr of 0.69 mg/dL). Liver Function Tests: Recent Labs  Lab 03/27/18 1848 03/30/18 0223  AST 49* 30  ALT 28 22  ALKPHOS 216* 156*  BILITOT 0.4 0.7  PROT 6.6 6.3*  ALBUMIN 1.7* 1.7*   No results for input(s): LIPASE, AMYLASE in the last 168 hours. No results for input(s): AMMONIA in the last 168 hours. Coagulation Profile: Recent Labs  Lab 03/27/18 1848  INR 1.19   Cardiac Enzymes: No results for input(s): CKTOTAL, CKMB, CKMBINDEX, TROPONINI in the last 168 hours. BNP (last 3 results) No results for input(s): PROBNP in the last 8760 hours. HbA1C: No results for input(s): HGBA1C in the last 72 hours. CBG: Recent Labs  Lab 03/29/18 1610 03/29/18 1950 03/30/18 0004 03/30/18 0319  03/30/18 0748  GLUCAP 100* 123* 85 109* 124*   Lipid Profile: No results for input(s): CHOL, HDL, LDLCALC, TRIG, CHOLHDL, LDLDIRECT in the last 72 hours. Thyroid Function Tests: No results for input(s): TSH, T4TOTAL, FREET4, T3FREE, THYROIDAB in the last 72 hours. Anemia Panel: No results for input(s): VITAMINB12, FOLATE, FERRITIN, TIBC, IRON, RETICCTPCT in the last 72 hours. Sepsis Labs: No results for input(s): PROCALCITON, LATICACIDVEN in the last 168 hours.  Recent Results (from the past 240 hour(s))  Culture, blood (Routine x 2)     Status: None   Collection Time: 03/23/18  7:23 PM  Result Value Ref Range Status   Specimen Description RIGHT ANTECUBITAL  Final   Special Requests   Final    BOTTLES DRAWN AEROBIC AND ANAEROBIC Blood Culture adequate volume   Culture   Final    NO GROWTH 5 DAYS Performed at [facility], [address] [place], [place] [address]    Report Status 03/28/2018 FINAL  Final  Culture, blood (Routine x 2)     Status: None   Collection Time: 03/23/18  7:23 PM  Result Value Ref Range Status   Specimen Description BLOOD RIGHT WRIST  Final   Special Requests   Final  BOTTLES DRAWN AEROBIC AND ANAEROBIC Blood Culture adequate volume   Culture   Final    NO GROWTH 5 DAYS Performed at [facility], [address], [place], [place] [address]    Report Status 03/28/2018 FINAL  Final  MRSA PCR Screening     Status: None   Collection Time: 03/24/18  3:36 PM  Result Value Ref Range Status   MRSA by PCR NEGATIVE NEGATIVE Final    Comment:        The GeneXpert MRSA Assay (FDA approved for NASAL specimens only), is one component of a comprehensive MRSA colonization surveillance program. It is not intended to diagnose MRSA infection nor to guide or monitor treatment for MRSA infections. Performed at [facility] [address], [place], [place] [address]   Culture, respiratory (NON-Expectorated)     Status: None   Collection Time: 03/24/18  3:49 PM   Result Value Ref Range Status   Specimen Description   Final    EXPECTORATED SPUTUM Performed at [facility], [address], [place], [place] [address]    Special Requests   Final    NONE Performed at [facility], [address] [place], [place] [address]    Gram Stain   Final    ABUNDANT WBC PRESENT,BOTH PMN AND MONONUCLEAR RARE GRAM POSITIVE COCCI    Culture   Final    FEW Consistent with normal respiratory flora. Performed at [facility], [place] [address] [place], [place] [address]    Report Status 03/26/2018 FINAL  Final  Body fluid culture     Status: None   Collection Time: 03/28/18  8:59 AM  Result Value Ref Range Status   Specimen Description PLEURAL  Final   Special Requests RIGHT  Final   Gram Stain   Final    MODERATE WBC PRESENT, PREDOMINANTLY PMN NO ORGANISMS SEEN    Culture   Final    NO GROWTH 3 DAYS Performed at [facility], [place] [address] [place], [place] [address]    Report Status 03/31/2018 FINAL  Final  Aerobic/Anaerobic Culture (surgical/deep wound)     Status: None (Preliminary result)   Collection Time: 03/28/18  9:23 AM  Result Value Ref Range Status   Specimen Description LUNG RIGHT  Final   Special Requests PLEURAL PEEL  Final   Gram Stain   Final    FEW WBC PRESENT,BOTH PMN AND MONONUCLEAR NO ORGANISMS SEEN    Culture   Final    NO GROWTH 4 DAYS NO ANAEROBES ISOLATED; CULTURE IN PROGRESS FOR 5 DAYS Performed at [facility], [place] [address], [place], [place] [address]    Report Status PENDING  Incomplete         Radiology Studies: Dg Chest Port 1 View  Result Date: 04/01/2018 CLINICAL DATA:  Empyema EXAM: PORTABLE CHEST 1 VIEW COMPARISON:  03/31/2018 FINDINGS: Left jugular central venous catheter and right jugular Port-A-Cath are stable. Right chest tubes are stable. Right loculated pleural effusion is stable. Heterogeneous opacities in the right lung are stable. Extensive emphysema over the right chest wall and  right neck is stable. Left lung is clear. IMPRESSION: Stable right chest tubes and right empyema. Electronically Signed   By: Marybelle Killings M.D.   On: 04/01/2018 12:37        Scheduled Meds: . acetaminophen  1,000 mg Oral Q6H   Or  . acetaminophen (TYLENOL) oral liquid 160 mg/5 mL  1,000 mg Oral Q6H  . apixaban  5 mg Oral BID  . Chlorhexidine Gluconate Cloth  6 each Topical Daily  . escitalopram  20 mg Oral Daily  . gabapentin  600 mg Oral BID  . guaiFENesin  1,200 mg Oral BID  . lactose free nutrition  237 mL Oral BID BM  . methadone  20 mg Oral 5 X Daily  . mirtazapine  15 mg Oral QHS  . polyvinyl alcohol  2 drop Both Eyes Daily  . sodium chloride flush  10-40 mL Intracatheter Q12H  . varenicline  0.5 mg Oral BID   Continuous Infusions: . sodium chloride 10 mL/hr at 03/29/18 2000  . piperacillin-tazobactam (ZOSYN)  IV 3.375 g (04/02/18 0514)  . potassium chloride Stopped (03/31/18 0914)  . vancomycin Stopped (04/02/18 0630)     LOS: 10 days    Georgette Shell, MD [facility]  If 7PM-7AM, please contact night-coverage [email or web address] Password [facility] 04/02/2018, 9:50 AM

## 2018-04-02 NOTE — Progress Notes (Signed)
Pharmacy Antibiotic Note  Erik [name] is a 57 y.o. male admitted on 03/23/2018 with empyema/pneumonia. Pharmacy has been consulted for vancomycin and Zosyn dosing. WBC WNL, currently AF. Scr has been stable < 1, estimated CrCl > 100 mL/min. Last VT is within goal range at 16 on vancomycin 1g, q8h.  Plan: Continue vancomycin 1000mg  IV q8h - stop 4/30 in computer Continue Zosyn 3.375g IV every 8h EI - stop 4/30 in computer   Height: 6' (182.9 cm) Weight: 212 lb 4.8 oz (96.3 kg) IBW/kg (Calculated) : 77.6  Temp (24hrs), Avg:98.2 F (36.8 C), Min:97.8 F (36.6 C), Max:98.7 F (37.1 C)  Recent Labs  Lab 03/26/18 2330  03/27/18 1848 03/28/18 0612 03/29/18 0310 03/30/18 0223 03/30/18 1309 03/31/18 0544 04/01/18 0320  WBC  --    < > 5.8 5.4 9.9 9.7  --  8.3 7.7  CREATININE  --    < > 0.70  --  0.58* 0.54*  --  0.66 0.69  VANCOTROUGH 11*  --   --   --   --   --  16  --   --    < > = values in this interval not displayed.    Estimated Creatinine Clearance: 125.6 mL/min (by C-G formula based on SCr of 0.69 mg/dL).    Antimicrobials this admission: Vanc 4/18>> Zosyn 4/18>>  Dose adjustments this admission: 4/21 VT = 11 on 750mg  q8h >> inc to 1g q8h 4/25 VT = 16 on 1g q8h >> no changes  Microbiology results: 4/18 BCx: NG final 4/19 MRSA PCR: neg 4/23 pleural fluid cx: pending 4/23 pleural cx: pending   Bonnita Nasuti Pharm.D. CPP, BCPS Clinical Pharmacist [phone] 04/02/2018 2:44 PM

## 2018-04-03 ENCOUNTER — Inpatient Hospital Stay (HOSPITAL_COMMUNITY): Payer: Medicare Other

## 2018-04-03 NOTE — Progress Notes (Signed)
      [address]       [place] [address]             [phone]      6 Days Post-Op Procedure(s) (LRB): VIDEO ASSISTED THORACOSCOPY (VATS)/EMPYEMA (Right) DECORTICATION (Right) Subjective: Feels better this morning. Diarrhea has slowed down but not slopped.   Objective: Vital signs in last 24 hours: Temp:  [97.6 F (36.4 C)-98.8 F (37.1 C)] 98.8 F (37.1 C) (04/29 0731) Pulse Rate:  [87-106] 99 (04/29 0731) Cardiac Rhythm: Normal sinus rhythm (04/29 0800) Resp:  [16-22] 18 (04/29 0731) BP: (95-120)/(65-85) 117/85 (04/29 0731) SpO2:  [93 %-97 %] 93 % (04/29 0731)     Intake/Output from previous day: 04/28 0701 - 04/29 0700 In: 222 [P.O.:222] Out: 1300 [Urine:950; Chest Tube:350] Intake/Output this shift: No intake/output data recorded.  General appearance: alert, cooperative and no distress Heart: sinus tachycardia Lungs: clear to auscultation bilaterally and some rhonchi Abdomen: soft, non-tender; bowel sounds normal; no masses,  no organomegaly Extremities: 1+ pedal edema Wound: clean and dry  Lab Results: Recent Labs    04/01/18 0320  WBC 7.7  HGB 10.5*  HCT 34.2*  PLT 312   BMET:  Recent Labs    04/01/18 0320  NA 141  K 3.8  CL 105  CO2 32  GLUCOSE 77  BUN 6  CREATININE 0.69  CALCIUM 8.2*    PT/INR: No results for input(s): LABPROT, INR in the last 72 hours. ABG    Component Value Date/Time   PHART 7.460 (H) 03/29/2018 0308   HCO3 34.1 (H) 03/29/2018 0308   TCO2 36 (H) 03/29/2018 0308   ACIDBASEDEF 4.0 (H) 07/19/2016 1407   O2SAT 97.0 03/29/2018 0308   CBG (last 3)  No results for input(s): GLUCAP in the last 72 hours.  Assessment/Plan: S/P Procedure(s) (LRB): VIDEO ASSISTED THORACOSCOPY (VATS)/EMPYEMA (Right) DECORTICATION (Right)  1. CV-ST rate is low 100s. BP well controlled. Continue Eliquis 2. Pulm-CXR looks stable this morning, diffuse right lung volume loss with small hydropneumothorax, no acute  findings. On 5 L [place]. 374ml/24 hours drainage.++ air leak and ++ fluid wave. On Water seal. 3. Renal-creatinine 0.69, electrolytes okay.  4. H and H stable. 10.5/34.2, platelets stable 5. Remains on Vanco and Zosyn day 12, 6 days post-op. Continue for 1 more days.  6. Chronic pain. On Methadone chronically. 7. Diarrhea-stop stool softeners. Possibly from the [place] ordered. CDiff Neg 8. Cough- congestion difficult to cough up, on mucinex, which is helping  Plan: Discontinue posterior chest tube today. CXR in the morning. Imodium for diarrhea.    LOS: 11 days    Erik [name] 04/03/2018

## 2018-04-03 NOTE — Progress Notes (Signed)
Removed posterior chest tube at 1200. Pt was tolerated well, and anterior chest tube is still in place. Encouraged pt to ambulate today. HS [employer]

## 2018-04-03 NOTE — Progress Notes (Signed)
PROGRESS NOTE    Erik [name]  YYT:[id number] DOB: 11/03/1961 DOA: 03/23/2018 PCP: [name], MD   Brief Narrative: 56 year old male with a history of COPD on 2 L oxygen, lung cancer, presented to [facility] with shortness of breath and fevers. 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 He was transferred to [facility] for further treatments. On intravenous antibiotics. Cardiothoracic surgeryteam proceeded with video-assisted thoracoscopy (VATS) with drainage of empyema and decortication on 4/23.Patientis chronically on anticoagulation for pulmonary embolus diagnosed in 12/2017. On IV heparin during procedure. Eliquis restarted on 4/26.  No events overnight. Patient still complains of pain, mostly in his lower back which is chronic. States that breathing is a little bit easier.       Assessment & Plan:   Principal Problem:   Empyema of lung ([place]) Active Problems:   Depression with anxiety   Adenocarcinoma of right lung (HCC)   COPD (chronic obstructive pulmonary disease) ([place])   Essential hypertension   Acute on chronic respiratory failure with hypoxia (HCC)   Sepsis due to pneumonia (HCC)   History of pulmonary embolism   Empyema lung (HCC)   Malnutrition of moderate degree  Sepsis secondary to empyema in the setting of chronic effusion with prior radiation Met criteria for sepsis on admission given lactic acidosis, leukocytosis, tachycardia and hypotension. Sepsis resolved. Status post decortication with chest tube placement. Chest tube continues to have good drainage. Continue broad-spectrum IV antibiotics(at least for10moe days as per CVTS). Oxygen saturation stable. Encouraging  spirometer use.Fluid cultureno growth to date.  PT Consult noted.  Essential hypertension: Elevated blood pressures secondary to pain, tachycardic.On low-dose Lopressor. He may have had some mild withdrawals from  methadone which has been restarted.  Chronic low back pain: Methadone restarted  Tobacco use disorder Continue Chantix for tobacco cessation  Anemia of chronic disease Hemoglobin remained stable. Serosanguineous fluid in the collector from chest tube  History of pulmonary embolism Restarted Eliquis on 4/26 morning Continue monitoring on telemetry Continue O2 supplementation to maintain O2 saturation 92% or greater.  Protein calorie malnutrition moderate:Patient meets criteria as related to chronic illness, cancer and cancer related treatments as evidenced by mild fat depletion, mild and moderate muscle depletion and edema. Seen by nutrition. On supplements and boost twice daily.          DVT prophylaxis: eliquis Code Status: full Family Communication:none Disposition Plan:tbd Consultants:  CTs    Procedures:VATS right-sided decortication from right-sided empyema    Antimicrobials:Vanco and Zosyn started 418     Subjective: Denies having any diarrhea since last night.   Objective: Vitals:   04/02/18 1916 04/03/18 0020 04/03/18 0400 04/03/18 0731  BP: 110/81 103/65 107/70 117/85  Pulse: (!) 103 95 97 99  Resp: 18 18 (!) 22 18  Temp: 98.1 F (36.7 C) 98 F (36.7 C) 98 F (36.7 C) 98.8 F (37.1 C)  TempSrc: Oral Oral Oral Oral  SpO2: 96% 96% 96% 93%  Weight:      Height:        Intake/Output Summary (Last 24 hours) at 04/03/2018 1028 Last data filed at 04/03/2018 0900 Gross per 24 hour  Intake 240 ml  Output 1105 ml  Net -865 ml   Filed Weights   03/25/18 0500 03/26/18 0500 03/27/18 0500  Weight: 94.3 kg (207 lb 14.3 oz) 96.3 kg (212 lb 4.9 oz) 96.3 kg (212 lb 4.8 oz)    Examination:  General exam: Appears calm and comfortable  Respiratory  system: DECREASED breath sounds to auscultation. Respiratory effort normal.CT right  Cardiovascular system: S1 & S2 heard, RRR. No JVD, murmurs, rubs, gallops or clicks. No pedal  edema. Gastrointestinal system: Abdomen is nondistended, soft and nontender. No organomegaly or masses felt. Normal bowel sounds heard. Central nervous system: Alert and oriented. No focal neurological deficits. Extremities: Symmetric 5 x 5 power. Skin: No rashes, lesions or ulcers Psychiatry: Judgement and insight appear normal. Mood & affect appropriate.     Data Reviewed: I have personally reviewed following labs and imaging studies  CBC: Recent Labs  Lab 03/28/18 0612 03/28/18 1121 03/29/18 0310 03/30/18 0223 03/31/18 0544 04/01/18 0320  WBC 5.4  --  9.9 9.7 8.3 7.7  HGB 12.0* 11.9* 10.7* 11.0* 10.3* 10.5*  HCT 39.1 35.0* 34.5* 34.9* 32.8* 34.2*  MCV 96.8  --  93.0 92.6 92.1 93.7  PLT 257  --  299 311 313 924   Basic Metabolic Panel: Recent Labs  Lab 03/27/18 1848 03/28/18 1121 03/29/18 0310 03/30/18 0223 03/31/18 0544 04/01/18 0320  NA 137 139 139 141 140 141  K 4.0 4.8 3.8 3.5 3.3* 3.8  CL 98*  --  101 103 103 105  CO2 31  --  30 30 30  32  GLUCOSE 130*  --  122* 100* 94 77  BUN 5*  --  6 6 <5* 6  CREATININE 0.70  --  0.58* 0.54* 0.66 0.69  CALCIUM 8.0*  --  7.9* 8.2* 8.0* 8.2*   GFR: Estimated Creatinine Clearance: 125.6 mL/min (by C-G formula based on SCr of 0.69 mg/dL). Liver Function Tests: Recent Labs  Lab 03/27/18 1848 03/30/18 0223  AST 49* 30  ALT 28 22  ALKPHOS 216* 156*  BILITOT 0.4 0.7  PROT 6.6 6.3*  ALBUMIN 1.7* 1.7*   No results for input(s): LIPASE, AMYLASE in the last 168 hours. No results for input(s): AMMONIA in the last 168 hours. Coagulation Profile: Recent Labs  Lab 03/27/18 1848  INR 1.19   Cardiac Enzymes: No results for input(s): CKTOTAL, CKMB, CKMBINDEX, TROPONINI in the last 168 hours. BNP (last 3 results) No results for input(s): PROBNP in the last 8760 hours. HbA1C: No results for input(s): HGBA1C in the last 72 hours. CBG: Recent Labs  Lab 03/29/18 1610 03/29/18 1950 03/30/18 0004 03/30/18 0319  03/30/18 0748  GLUCAP 100* 123* 85 109* 124*   Lipid Profile: No results for input(s): CHOL, HDL, LDLCALC, TRIG, CHOLHDL, LDLDIRECT in the last 72 hours. Thyroid Function Tests: No results for input(s): TSH, T4TOTAL, FREET4, T3FREE, THYROIDAB in the last 72 hours. Anemia Panel: No results for input(s): VITAMINB12, FOLATE, FERRITIN, TIBC, IRON, RETICCTPCT in the last 72 hours. Sepsis Labs: No results for input(s): PROCALCITON, LATICACIDVEN in the last 168 hours.  Recent Results (from the past 240 hour(s))  MRSA PCR Screening     Status: None   Collection Time: 03/24/18  3:36 PM  Result Value Ref Range Status   MRSA by PCR NEGATIVE NEGATIVE Final    Comment:        The GeneXpert MRSA Assay (FDA approved for NASAL specimens only), is one component of a comprehensive MRSA colonization surveillance program. It is not intended to diagnose MRSA infection nor to guide or monitor treatment for MRSA infections. Performed at [facility], [address] [place], [place] [address]   Culture, respiratory (NON-Expectorated)     Status: None   Collection Time: 03/24/18  3:49 PM  Result Value Ref Range Status   Specimen Description  Final    EXPECTORATED SPUTUM Performed at [facility], [address] [place], [place] [address]    Special Requests   Final    NONE Performed at [facility], [address], [place], [place] [address]    Gram Stain   Final    ABUNDANT WBC PRESENT,BOTH PMN AND MONONUCLEAR RARE GRAM POSITIVE COCCI    Culture   Final    FEW Consistent with normal respiratory flora. Performed at [facility], [place] [address] [place], [place] [address]    Report Status 03/26/2018 FINAL  Final  Body fluid culture     Status: None   Collection Time: 03/28/18  8:59 AM  Result Value Ref Range Status   Specimen Description PLEURAL  Final   Special Requests RIGHT  Final   Gram Stain   Final    MODERATE WBC PRESENT, PREDOMINANTLY PMN NO ORGANISMS SEEN    Culture    Final    NO GROWTH 3 DAYS Performed at [facility], [place] [address] [place], [place] [address]    Report Status 03/31/2018 FINAL  Final  Aerobic/Anaerobic Culture (surgical/deep wound)     Status: None   Collection Time: 03/28/18  9:23 AM  Result Value Ref Range Status   Specimen Description LUNG RIGHT  Final   Special Requests PLEURAL PEEL  Final   Gram Stain   Final    FEW WBC PRESENT,BOTH PMN AND MONONUCLEAR NO ORGANISMS SEEN    Culture   Final    No growth aerobically or anaerobically. Performed at [facility], [place] [address], [place], [place] [address]    Report Status 04/02/2018 FINAL  Final  C difficile quick scan w PCR reflex     Status: None   Collection Time: 04/02/18  5:25 PM  Result Value Ref Range Status   C Diff antigen NEGATIVE NEGATIVE Final   C Diff toxin NEGATIVE NEGATIVE Final   C Diff interpretation No C. difficile detected.  Final    Comment: Performed at [facility], [place] [address] [place], [place] [address]         Radiology Studies: Dg Chest Port 1 View  Result Date: 04/03/2018 CLINICAL DATA:  Shortness of breath, chest tube EXAM: PORTABLE CHEST 1 VIEW COMPARISON:  04/02/2018 FINDINGS: Extensive subcutaneous emphysema again noted throughout the right chest wall. Right chest tubes, left central line and right Port-A-Cath remain in place, unchanged. Diffuse lung volumes and airspace disease throughout the right lung. Continued right hydropneumothorax, stable. No confluent opacity on the left. Mild cardiomegaly. IMPRESSION: No significant change in the right hydropneumothorax and extensive right subcutaneous emphysema. Right chest tubes are in stable position. Volume loss with diffuse airspace disease throughout the right lung, stable. Electronically Signed   By: Rolm Baptise M.D.   On: 04/03/2018 07:57   Dg Chest Port 1 View  Result Date: 04/02/2018 CLINICAL DATA:  Status post VATS for empyema.  Chest tube in place. EXAM: PORTABLE  CHEST 1 VIEW COMPARISON:  04/01/2018 FINDINGS: Right chest tubes remain in place. Diffuse right lung volume loss again seen. Small right hydropneumothorax remains stable. Extensive subcutaneous emphysema again seen throughout the right chest wall. Compensatory hyperinflation of left lung is again seen, which remains clear. Bilateral jugular central venous catheters remain in appropriate position. Heart size is within normal limits. IMPRESSION: Stable diffuse right lung volume loss with small hydropneumothorax. No acute findings. Electronically Signed   By: Earle Gell M.D.   On:  04/02/2018 10:09        Scheduled Meds: . apixaban  5 mg Oral BID  . Chlorhexidine Gluconate Cloth  6 each Topical Daily  . escitalopram  20 mg Oral Daily  . gabapentin  600 mg Oral BID  . guaiFENesin  1,200 mg Oral BID  . lactose free nutrition  237 mL Oral BID BM  . methadone  20 mg Oral 5 X Daily  . mirtazapine  15 mg Oral QHS  . polyvinyl alcohol  2 drop Both Eyes Daily  . sodium chloride flush  10-40 mL Intracatheter Q12H  . varenicline  0.5 mg Oral BID   Continuous Infusions: . sodium chloride 10 mL/hr at 03/29/18 2000  . piperacillin-tazobactam (ZOSYN)  IV Stopped (04/03/18 8138)  . potassium chloride Stopped (03/31/18 0914)  . vancomycin 1,000 mg (04/03/18 0520)     LOS: 11 days        Georgette Shell, MD [facility] If 7PM-7AM, please contact night-coverage [email or web address] Password [facility] 04/03/2018, 10:28 AM

## 2018-04-04 ENCOUNTER — Inpatient Hospital Stay (HOSPITAL_COMMUNITY): Payer: Medicare Other

## 2018-04-04 LAB — BASIC METABOLIC PANEL
Anion gap: 7 (ref 5–15)
BUN: 15 mg/dL (ref 6–20)
CO2: 29 mmol/L (ref 22–32)
Calcium: 8 mg/dL — ABNORMAL LOW (ref 8.9–10.3)
Chloride: 104 mmol/L (ref 101–111)
Creatinine, Ser: 1.55 mg/dL — ABNORMAL HIGH (ref 0.61–1.24)
GFR calc Af Amer: 57 mL/min — ABNORMAL LOW (ref >60)
GFR, EST NON AFRICAN AMERICAN: 49 mL/min — AB (ref >60)
Glucose, Bld: 81 mg/dL (ref 65–99)
POTASSIUM: 4 mmol/L (ref 3.5–5.1)
SODIUM: 140 mmol/L (ref 135–145)

## 2018-04-04 LAB — C DIFFICILE QUICK SCREEN W PCR REFLEX
C DIFFICILE (CDIFF) TOXIN: NEGATIVE
C DIFFICLE (CDIFF) ANTIGEN: NEGATIVE
C Diff interpretation: NOT DETECTED

## 2018-04-04 MED ORDER — AMOXICILLIN-POT CLAVULANATE 500-125 MG PO TABS
1.0000 | ORAL_TABLET | Freq: Three times a day (TID) | ORAL | Status: DC
  Administered 2018-04-04 – 2018-04-08 (×12): 500 mg via ORAL
  Filled 2018-04-04 (×12): qty 1

## 2018-04-04 MED ORDER — SODIUM CHLORIDE 0.9 % IV SOLN
Freq: Once | INTRAVENOUS | Status: AC
  Administered 2018-04-04: 13:00:00 via INTRAVENOUS

## 2018-04-04 MED ORDER — SACCHAROMYCES BOULARDII 250 MG PO CAPS
250.0000 mg | ORAL_CAPSULE | Freq: Two times a day (BID) | ORAL | Status: DC
  Administered 2018-04-04 – 2018-04-08 (×9): 250 mg via ORAL
  Filled 2018-04-04 (×9): qty 1

## 2018-04-04 NOTE — Plan of Care (Signed)
Continue with plan of care. Patient is progressing toward goals.

## 2018-04-04 NOTE — Discharge Instructions (Signed)
Thoracoscopy, Care After Refer to this sheet in the next few weeks. These instructions provide you with information about caring for yourself after your procedure. Your health care provider may also give you more specific instructions. Your treatment has been planned according to current medical practices, but problems sometimes occur. Call your health care provider if you have any problems or questions after your procedure. What can I expect after the procedure? After your procedure, it is common to feel sore for up to two weeks. Follow these instructions at home:  There are many different ways to close and cover an incision, including stitches (sutures), skin glue, and adhesive strips. Follow your health care provider's instructions about: ? Incision care. ? Bandage (dressing) changes and removal. ? Incision closure removal.  Check your incision area every day for signs of infection. Watch for: ? Redness, swelling, or pain. ? Fluid, blood, or pus.  Take medicines only as directed by your health care provider.  Try to cough often. Coughing helps to protect against lung infection (pneumonia). It may hurt to cough. If this happens, hold a pillow against your chest when you cough.  Take deep breaths. This also helps to protect against pneumonia.  If you were given an incentive spirometer, use it as directed by your health care provider.  Do not take baths, swim, or use a hot tub until your health care provider approves. You may take showers.  Avoid lifting until your health care provider approves.  Avoid driving until your health care provider approves.  Do not travel by airplane after the chest tube is removed until your health care provider approves. Contact a health care provider if:  You have a fever.  Pain medicines do not ease your pain.  You have redness, swelling, or increasing pain in your incision area.  You develop a cough that does not go away, or you are coughing up  mucus that is yellow or green. Get help right away if:  You have fluid, blood, or pus coming from your incision.  There is a bad smell coming from your incision or dressing.  You develop a rash.  You have difficulty breathing.  You cough up blood.  You develop light-headedness or you feel faint.  You develop chest pain.  Your heartbeat feels irregular or very fast. This information is not intended to replace advice given to you by your health care provider. Make sure you discuss any questions you have with your health care provider. Document Released: 06/11/2005 Document Revised: 07/25/2016 Document Reviewed: 08/07/2014 Elsevier Interactive Patient Education  2018 [facility] Stay Proper nutrition can help your body recover from illness and injury.   Foods and beverages high in protein, vitamins, and minerals help rebuild muscle loss, promote healing, & reduce fall risk.   In addition to eating healthy foods, a nutrition shake is an easy, delicious way to get the nutrition you need during and after your hospital stay  It is recommended that you continue to drink 2 bottles per day of:       Ensure Enlive/Boost Plus for at least 1 month (30 days) after your hospital stay   Tips for adding a nutrition shake into your routine: As allowed, drink one with vitamins or medications instead of water or juice Enjoy one as a tasty mid-morning or afternoon snack Drink cold or make a milkshake out of it Drink one instead of milk with cereal or snacks Use as a coffee creamer  Available at the following grocery stores and pharmacies:           * [employer] [phone]            For COUPONS visit: www.ensure.com/join or http://dawson-may.com/   Suggested Substitutions Ensure Plus = Boost  Plus = Carnation Breakfast Essentials = Boost Compact Ensure Active Clear = Boost Breeze Glucerna Shake = Boost Glucose Control = Carnation Breakfast Essentials SUGAR FREE

## 2018-04-04 NOTE — Progress Notes (Signed)
Physical Therapy Treatment Patient Details Name: Erik [name] MRN: [id number] DOB: 10/17/1961 Today's Date: 04/04/2018    History of Present Illness 57 yo admitted with SOB and Rt empyema s/p VATS 4/23 with sepsis acutely. PMhx: COPD on home O2, lung CA, HTN    PT Comments    Pt demonstrates decreased exercise endurance as evidenced by need for O2 supplementation during exercise. Pt would benefit from continued PT to address endurance impairment to improve functional mobility and level of independence. PT will progress ambulation with the goal of weaning pt from IV pole and supplemental O2. Gait training and LE TherEx performed today  Follow Up Recommendations  No PT follow up     Equipment Recommendations  None recommended by PT    Recommendations for Other Services       Precautions / Restrictions Precautions Precautions: Fall Precaution Comments: chest tube    Mobility  Bed Mobility               General bed mobility comments: in chair on arrival  Transfers Overall transfer level: Modified independent                  Ambulation/Gait Ambulation/Gait assistance: Min guard Ambulation Distance (Feet): 200 Feet Assistive device: (holding IV pole) Gait Pattern/deviations: Step-through pattern;Decreased stride length   Gait velocity interpretation: 1.31 - 2.62 ft/sec, indicative of limited community ambulator General Gait Details: Pt held IV pole with RUE during gait with no LOB and reported improved endurance as he maintained pursed lip breathing through activity. 2 short standing rest breaks of ~ 30 seconds due to inconsistent SpO2 readings. Pt asymptomatic and SpO2 levels were WNL when in pt room post ambulation   Stairs             Wheelchair Mobility    Modified Rankin (Stroke Patients Only)       Balance                                            Cognition Arousal/Alertness: Awake/alert Behavior During Therapy: WFL  for tasks assessed/performed Overall Cognitive Status: Within Functional Limits for tasks assessed                                        Exercises General Exercises - Lower Extremity Long Arc Quad: AROM;10 reps;Seated;Both Hip Flexion/Marching: Both;AROM;Other reps (comment)(30 reps)    General Comments        Pertinent Vitals/Pain Pain Assessment: No/denies pain    Home Living                      Prior Function            PT Goals (current goals can now be found in the care plan section) Progress towards PT goals: Progressing toward goals    Frequency           PT Plan Current plan remains appropriate    Co-evaluation              AM-PAC PT "6 Clicks" Daily Activity  Outcome Measure  Difficulty turning over in bed (including adjusting bedclothes, sheets and blankets)?: A Little Difficulty moving from lying on back to sitting on the side of the bed? : A Little Difficulty sitting down  on and standing up from a chair with arms (e.g., wheelchair, bedside commode, etc,.)?: None Help needed moving to and from a bed to chair (including a wheelchair)?: None Help needed walking in hospital room?: A Little Help needed climbing 3-5 steps with a railing? : A Little 6 Click Score: 20    End of Session Equipment Utilized During Treatment: Oxygen;Gait belt Activity Tolerance: Patient tolerated treatment well Patient left: in chair;with call bell/phone within reach Nurse Communication: Mobility status PT Visit Diagnosis: Other abnormalities of gait and mobility (R26.89)     Time: 1246-1310 PT Time Calculation (min) (ACUTE ONLY): 24 min  Charges:  $Gait Training: 8-22 mins $Therapeutic Exercise: 8-22 mins                    G Codes:       Gabe Sydnee Lamour SPT   [employer] 04/04/2018, 1:19 PM

## 2018-04-04 NOTE — Progress Notes (Signed)
Physician notified: Johann Capers, PA At: 1604  Regarding: OK to [place]? Pt has PIV and R chest portacath accessed. Awaiting return response.   Returned Response at: 1606  Order(s): DC L IJ central line.

## 2018-04-04 NOTE — Progress Notes (Signed)
PROGRESS NOTE    Erik [name]  YKZ:[id number] DOB: 1961/11/22 DOA: 03/23/2018 PCP: [name], MD  Brief Narrative: 57 year old male with a history of COPD on 2 L oxygen, lung cancer, presented to [facility] with shortness of breath and fevers. 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 He was transferred to [facility] for further treatments. On intravenous antibiotics. Cardiothoracic surgeryteam proceeded with video-assisted thoracoscopy (VATS) with drainage of empyema and decortication on 4/23.Patientis chronically on anticoagulation for pulmonary embolus diagnosed in 12/2017. On IV heparin during procedure. Eliquis restarted on 4/26.  No events overnight. Patient still complains of pain, mostly in his lower back which is chronic. States that breathing is a little bit easier.  04/04/2018 I seen the patient prior to having a bowel movement. Reviewed his CT notes.  Apparently he had diarrhea this morning.    Assessment & Plan:   Principal Problem:   Empyema of lung ([place]) Active Problems:   Depression with anxiety   Adenocarcinoma of right lung (HCC)   COPD (chronic obstructive pulmonary disease) ([place])   Essential hypertension   Acute on chronic respiratory failure with hypoxia (HCC)   Sepsis due to pneumonia (HCC)   History of pulmonary embolism   Empyema lung (HCC)   Malnutrition of moderate degree  Sepsis secondary to empyema in the setting of chronic effusion with prior radiation Met criteria for sepsis on admission given lactic acidosis, leukocytosis, tachycardia and hypotension. Sepsis resolved. Status post decortication with chest tube placement. Chest tube continues to have good drainage.  Augmentin started 04/04/2018. Oxygen saturation stable. Encouraging spirometer use.Fluid cultureno growth to date.  PT Consultrecommends home with PT.  Essential hypertension: Elevated blood pressures secondary to  pain, tachycardic.On low-dose Lopressor. He may have had some mild withdrawals from methadone which has been restarted.  Chronic low back pain: Methadone restarted  Tobacco use disorder Continue Chantix for tobacco cessation  Anemia of chronic disease Hemoglobin remained stable. Serosanguineous fluid in the collector from chest tube  History of pulmonary embolism Restarted Eliquis on 4/26 morning Continue monitoring on telemetry  Diarrhea-patient started having diarrhea after taking stool softeners and laxatives.  These have been stopped at this time.  Noted patient placed on probiotics and as needed Imodium.  Elevated creatinine secondary to fluid loss from diarrhea we will give a bag of IV fluids.  Low up levels tomorrow.     DVT prophylaxis: Eliquis Code Status: Full code Family Communication: No family available Disposition Plan:  Plan is to discharge home with home PT once the chest tube is out. Consultants: CT surgery   Procedures:VATS right-sided decortication from right-sided empyema    Antimicrobials: Vanco and Zosyn stopped 04/04/2018 Augmentin started 04/04/2018.  Subjective:feels okay    Objective: Vitals:   04/04/18 0300 04/04/18 0700 04/04/18 0750 04/04/18 1100  BP: 104/72 124/79 124/79 117/78  Pulse: (!) 102 (!) 108 (!) 103 (!) 108  Resp: 13 15 (!) 23 14  Temp:  98.4 F (36.9 C)  98.4 F (36.9 C)  TempSrc:  Oral  Oral  SpO2: 94% 93% 96% 95%  Weight:      Height:        Intake/Output Summary (Last 24 hours) at 04/04/2018 1225 Last data filed at 04/04/2018 0900 Gross per 24 hour  Intake 1930 ml  Output 2215 ml  Net -285 ml   Filed Weights   03/25/18 0500 03/26/18 0500 03/27/18 0500  Weight: 94.3 kg (207 lb 14.3 oz) 96.3 kg (212  lb 4.9 oz) 96.3 kg (212 lb 4.8 oz)    Examination:  General exam: Appears calm and comfortable  Respiratory system: RIGHT CHEST TUBE decreased breath sounds auscultation. Respiratory effort  normal. Cardiovascular system: S1 & S2 heard, RRR. No JVD, murmurs, rubs, gallops or clicks. No pedal edema. Gastrointestinal system: Abdomen is nondistended, soft and nontender. No organomegaly or masses felt. Normal bowel sounds heard. Central nervous system: Alert and oriented. No focal neurological deficits. Extremities: Symmetric 5 x 5 power. Skin: No rashes, lesions or ulcers Psychiatry: Judgement and insight appear normal. Mood & affect appropriate.     Data Reviewed: I have personally reviewed following labs and imaging studies  CBC: Recent Labs  Lab 03/29/18 0310 03/30/18 0223 03/31/18 0544 04/01/18 0320  WBC 9.9 9.7 8.3 7.7  HGB 10.7* 11.0* 10.3* 10.5*  HCT 34.5* 34.9* 32.8* 34.2*  MCV 93.0 92.6 92.1 93.7  PLT 299 311 313 734   Basic Metabolic Panel: Recent Labs  Lab 03/29/18 0310 03/30/18 0223 03/31/18 0544 04/01/18 0320 04/04/18 0403  NA 139 141 140 141 140  K 3.8 3.5 3.3* 3.8 4.0  CL 101 103 103 105 104  CO2 30 30 30  32 29  GLUCOSE 122* 100* 94 77 81  BUN 6 6 <5* 6 15  CREATININE 0.58* 0.54* 0.66 0.69 1.55*  CALCIUM 7.9* 8.2* 8.0* 8.2* 8.0*   GFR: Estimated Creatinine Clearance: 64.8 mL/min (A) (by C-G formula based on SCr of 1.55 mg/dL (H)). Liver Function Tests: Recent Labs  Lab 03/30/18 0223  AST 30  ALT 22  ALKPHOS 156*  BILITOT 0.7  PROT 6.3*  ALBUMIN 1.7*   No results for input(s): LIPASE, AMYLASE in the last 168 hours. No results for input(s): AMMONIA in the last 168 hours. Coagulation Profile: No results for input(s): INR, PROTIME in the last 168 hours. Cardiac Enzymes: No results for input(s): CKTOTAL, CKMB, CKMBINDEX, TROPONINI in the last 168 hours. BNP (last 3 results) No results for input(s): PROBNP in the last 8760 hours. HbA1C: No results for input(s): HGBA1C in the last 72 hours. CBG: Recent Labs  Lab 03/29/18 1610 03/29/18 1950 03/30/18 0004 03/30/18 0319 03/30/18 0748  GLUCAP 100* 123* 85 109* 124*   Lipid  Profile: No results for input(s): CHOL, HDL, LDLCALC, TRIG, CHOLHDL, LDLDIRECT in the last 72 hours. Thyroid Function Tests: No results for input(s): TSH, T4TOTAL, FREET4, T3FREE, THYROIDAB in the last 72 hours. Anemia Panel: No results for input(s): VITAMINB12, FOLATE, FERRITIN, TIBC, IRON, RETICCTPCT in the last 72 hours. Sepsis Labs: No results for input(s): PROCALCITON, LATICACIDVEN in the last 168 hours.  Recent Results (from the past 240 hour(s))  Body fluid culture     Status: None   Collection Time: 03/28/18  8:59 AM  Result Value Ref Range Status   Specimen Description PLEURAL  Final   Special Requests RIGHT  Final   Gram Stain   Final    MODERATE WBC PRESENT, PREDOMINANTLY PMN NO ORGANISMS SEEN    Culture   Final    NO GROWTH 3 DAYS Performed at [facility], [address], [place], [place] [address]    Report Status 03/31/2018 FINAL  Final  Aerobic/Anaerobic Culture (surgical/deep wound)     Status: None   Collection Time: 03/28/18  9:23 AM  Result Value Ref Range Status   Specimen Description LUNG RIGHT  Final   Special Requests PLEURAL PEEL  Final   Gram Stain   Final    FEW WBC PRESENT,BOTH PMN  AND MONONUCLEAR NO ORGANISMS SEEN    Culture   Final    No growth aerobically or anaerobically. Performed at [facility], [place] [address], [place], [place] [address]    Report Status 04/02/2018 FINAL  Final  C difficile quick scan w PCR reflex     Status: None   Collection Time: 04/02/18  5:25 PM  Result Value Ref Range Status   C Diff antigen NEGATIVE NEGATIVE Final   C Diff toxin NEGATIVE NEGATIVE Final   C Diff interpretation No C. difficile detected.  Final    Comment: Performed at [facility], [place] [address], [place], [place] [address]  C difficile quick scan w PCR reflex     Status: None   Collection Time: 04/04/18  9:43 AM  Result Value Ref Range Status   C Diff antigen NEGATIVE NEGATIVE Final   C Diff toxin NEGATIVE NEGATIVE Final   C  Diff interpretation No C. difficile detected.  Final         Radiology Studies: Dg Chest Port 1 View  Result Date: 04/04/2018 CLINICAL DATA:  Right-sided empyema with chest tube drainage. EXAM: PORTABLE CHEST 1 VIEW COMPARISON:  Portable chest x-ray of April 03, 2018 FINDINGS: There is chronic shift of the mediastinum toward the right. Only a small amount of aerated lung is visible on the right. The there appears to have been removal of the medial right chest tube. The lateral chest tube is in stable position over the lower hemithorax. There is no pneumothorax. There is considerable pleural thickening. The left lung is well-expanded. There is patchy density at the left lung base worrisome for atelectasis. The left heart border is normal. The pulmonary vascularity is not engorged. There is considerable subcutaneous emphysema on the right. The power port catheter tip projects over the distal third of the SVC. The left internal jugular venous catheter tip projects at the junction of the right and left brachiocephalic veins. IMPRESSION: Persistent right-sided volume loss with parenchymal consolidation and pleural thickening. The amount of aerated right lung appears slightly decreased today. Increased density at the left lung base worrisome for atelectasis or developing pneumonia. The support tubes are in reasonable position. Large amount of subcutaneous emphysema in the right axillary region and base of the right neck. Electronically Signed   By: David  Martinique M.D.   On: 04/04/2018 09:38   Dg Chest Port 1 View  Result Date: 04/03/2018 CLINICAL DATA:  Shortness of breath, chest tube EXAM: PORTABLE CHEST 1 VIEW COMPARISON:  04/02/2018 FINDINGS: Extensive subcutaneous emphysema again noted throughout the right chest wall. Right chest tubes, left central line and right Port-A-Cath remain in place, unchanged. Diffuse lung volumes and airspace disease throughout the right lung. Continued right hydropneumothorax,  stable. No confluent opacity on the left. Mild cardiomegaly. IMPRESSION: No significant change in the right hydropneumothorax and extensive right subcutaneous emphysema. Right chest tubes are in stable position. Volume loss with diffuse airspace disease throughout the right lung, stable. Electronically Signed   By: Rolm Baptise M.D.   On: 04/03/2018 07:57        Scheduled Meds: . amoxicillin-clavulanate  1 tablet Oral Q8H  . apixaban  5 mg Oral BID  . Chlorhexidine Gluconate Cloth  6 each Topical Daily  . escitalopram  20 mg Oral Daily  . gabapentin  600 mg Oral BID  . guaiFENesin  1,200 mg Oral BID  . lactose free nutrition  237 mL Oral BID BM  . methadone  20 mg Oral 5 X Daily  . mirtazapine  15 mg Oral QHS  . polyvinyl alcohol  2 drop Both Eyes Daily  . saccharomyces boulardii  250 mg Oral BID  . sodium chloride flush  10-40 mL Intracatheter Q12H  . varenicline  0.5 mg Oral BID   Continuous Infusions: . sodium chloride 10 mL/hr at 04/04/18 0300  . potassium chloride Stopped (03/31/18 0914)     LOS: 12 days      Georgette Shell, MD [facility]  If 7PM-7AM, please contact night-coverage [email or web address] Password [facility] 04/04/2018, 12:25 PM

## 2018-04-04 NOTE — Progress Notes (Signed)
      [address]       [place] [address]             [phone]      7 Days Post-Op Procedure(s) (LRB): VIDEO ASSISTED THORACOSCOPY (VATS)/EMPYEMA (Right) DECORTICATION (Right) Subjective: Feels okay from a surgical standpoint. He is still having copious amounts of diarrhea.   Objective: Vital signs in last 24 hours: Temp:  [98 F (36.7 C)-98.9 F (37.2 C)] 98.4 F (36.9 C) (04/30 0700) Pulse Rate:  [102-120] 108 (04/30 0700) Cardiac Rhythm: Sinus tachycardia (04/30 0700) Resp:  [13-32] 15 (04/30 0700) BP: (98-124)/(72-84) 124/79 (04/30 0700) SpO2:  [88 %-97 %] 93 % (04/30 0700)     Intake/Output from previous day: 04/29 0701 - 04/30 0700 In: 2200 [P.O.:1200; I.V.:950; IV Piggyback:50] Out: 2600 [Urine:2600] Intake/Output this shift: No intake/output data recorded.  General appearance: alert, cooperative and no distress Heart: regular rate and rhythm, S1, S2 normal, no murmur, click, rub or gallop Lungs: clear to auscultation bilaterally Abdomen: soft, non-tender; bowel sounds normal; no masses,  no organomegaly Extremities: extremities normal, atraumatic, no cyanosis or edema Wound: clean and dry, redressed remaining chest tube with vasoline gauze, 4 x 4s, and tape  Lab Results: No results for input(s): WBC, HGB, HCT, PLT in the last 72 hours. BMET:  Recent Labs    04/04/18 0403  NA 140  K 4.0  CL 104  CO2 29  GLUCOSE 81  BUN 15  CREATININE 1.55*  CALCIUM 8.0*    PT/INR: No results for input(s): LABPROT, INR in the last 72 hours. ABG    Component Value Date/Time   PHART 7.460 (H) 03/29/2018 0308   HCO3 34.1 (H) 03/29/2018 0308   TCO2 36 (H) 03/29/2018 0308   ACIDBASEDEF 4.0 (H) 07/19/2016 1407   O2SAT 97.0 03/29/2018 0308   CBG (last 3)  No results for input(s): GLUCAP in the last 72 hours.  Assessment/Plan: S/P Procedure(s) (LRB): VIDEO ASSISTED THORACOSCOPY (VATS)/EMPYEMA (Right) DECORTICATION (Right)   1. CV-ST rate  is low 100s. BP well controlled. Continue Eliquis 2. Pulm-CXR this morning appears rotated. Will await official read.  On4L [place]. 120ml/24 hours per nursing. + air leak and ++ fluid wave. On Water seal. 3. Renal-creatinine 1.55, electrolytes okay. Discontinue nephrotoxic medications. Not on Lasix.  4. H and H stable. 10.5/34.2, platelets stable 5. Augmentin PO.  6. Chronic pain. On Methadone chronically. 7. Diarrhea-stop stool softeners.Possibly from the Vanco.Imodium ordered. CDiff Neg 8. Cough- congestion difficult to cough up,onmucinex, which is helping  Plan: Ordered Probiotic for diarrhea. Reordered CDiff to re-evaluate since he is continuing to have diarrhea 3-4 times a day. Watch creatinine closely since there was a bump. Encouraged water intake. Wean oxygen as tolerated-he was on 3L at home. Continues to make slow and steady progress.    LOS: 12 days    Erik [name] 04/04/2018

## 2018-04-04 NOTE — Progress Notes (Signed)
Nutrition Follow-up  DOCUMENTATION CODES:   Non-severe (moderate) malnutrition in context of chronic illness  INTERVENTION:   -Continue Boost Plus BID, each supplement provides 360 kcals and 14 grams protein  NUTRITION DIAGNOSIS:   Moderate Malnutrition related to chronic illness, cancer and cancer related treatments as evidenced by mild fat depletion, mild muscle depletion, moderate muscle depletion, edema.  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 Pt with hx of COPD on 2L oxygen, lung cancer, HTN, depression/anxiety  4/23- s/p VATS 4/29- posterior chest tube d/c, imodium ordered for diarrhea 4/30- re-check c-diff due to diarrhea, probiotic added  Pt sleeping soundly at time of visit; did not arouse when name was called.   Pt continues to have a good appetite; noted meal completion 50-100% (averaging around 80%). Pt is also consuming Boost Plus supplements per MAR.   Labs reviewed.   Diet Order:   Diet Order           Diet Heart Room service appropriate? Yes; Fluid consistency: Thin  Diet effective now          EDUCATION NEEDS:   No education needs have been identified at this time  Skin:  Skin Assessment: Reviewed RN Assessment  Last BM:  04/03/18  Height:   Ht Readings from Last 1 Encounters:  03/24/18 6' (1.829 m)    Weight:   Wt Readings from Last 1 Encounters:  03/27/18 212 lb 4.8 oz (96.3 kg)    Ideal Body Weight:  80.91 kg  BMI:  Body mass index is 28.79 kg/m.  Estimated Nutritional Needs:   Kcal:  2150-2350 (23-25 kcal/kg bw)  Protein:  105-120g Pro (1.1-1.3g/kg bw)  Fluid:  2.2-2.4 L fluid (1 ml/kcal)    Floella Ensz A. Jimmye Norman,  RD, LDN, CDE Pager: [phone] After hours Pager: [phone]

## 2018-04-05 ENCOUNTER — Inpatient Hospital Stay (HOSPITAL_COMMUNITY): Payer: Medicare Other

## 2018-04-05 DIAGNOSIS — J9621 Acute and chronic respiratory failure with hypoxia: Secondary | ICD-10-CM

## 2018-04-05 DIAGNOSIS — Z86711 Personal history of pulmonary embolism: Secondary | ICD-10-CM

## 2018-04-05 DIAGNOSIS — I10 Essential (primary) hypertension: Secondary | ICD-10-CM

## 2018-04-05 DIAGNOSIS — C3491 Malignant neoplasm of unspecified part of right bronchus or lung: Secondary | ICD-10-CM

## 2018-04-05 DIAGNOSIS — J869 Pyothorax without fistula: Secondary | ICD-10-CM

## 2018-04-05 DIAGNOSIS — F418 Other specified anxiety disorders: Secondary | ICD-10-CM

## 2018-04-05 DIAGNOSIS — J449 Chronic obstructive pulmonary disease, unspecified: Secondary | ICD-10-CM

## 2018-04-05 LAB — CBC
HEMATOCRIT: 30.9 % — AB (ref 39.0–52.0)
HEMOGLOBIN: 9.6 g/dL — AB (ref 13.0–17.0)
MCH: 29 pg (ref 26.0–34.0)
MCHC: 31.1 g/dL (ref 30.0–36.0)
MCV: 93.4 fL (ref 78.0–100.0)
Platelets: 239 10*3/uL (ref 150–400)
RBC: 3.31 MIL/uL — ABNORMAL LOW (ref 4.22–5.81)
RDW: 16.6 % — AB (ref 11.5–15.5)
WBC: 8.2 10*3/uL (ref 4.0–10.5)

## 2018-04-05 LAB — BASIC METABOLIC PANEL
ANION GAP: 6 (ref 5–15)
BUN: 14 mg/dL (ref 6–20)
CHLORIDE: 105 mmol/L (ref 101–111)
CO2: 29 mmol/L (ref 22–32)
Calcium: 8.1 mg/dL — ABNORMAL LOW (ref 8.9–10.3)
Creatinine, Ser: 1.36 mg/dL — ABNORMAL HIGH (ref 0.61–1.24)
GFR calc Af Amer: >60 mL/min (ref >60)
GFR calc non Af Amer: 57 mL/min — ABNORMAL LOW (ref >60)
Glucose, Bld: 84 mg/dL (ref 65–99)
POTASSIUM: 3.6 mmol/L (ref 3.5–5.1)
Sodium: 140 mmol/L (ref 135–145)

## 2018-04-05 MED ORDER — SODIUM CHLORIDE 0.9 % IV SOLN
INTRAVENOUS | Status: AC
  Administered 2018-04-05: 18:00:00 via INTRAVENOUS

## 2018-04-05 NOTE — Progress Notes (Signed)
PROGRESS NOTE  Erik [name]  ZOX:[id number] DOB: 08-09-1961 DOA: 03/23/2018 PCP: [name], MD  Brief Narrative: 57 year old male with a history of COPD on 2 L oxygen, lung cancer, presented to [facility] with shortness of breath and fevers. 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 He was transferred to [facility] for further treatments. On intravenous antibiotics. Cardiothoracic surgeryteam proceeded with video-assisted thoracoscopy (VATS) with drainage of empyema and decortication on 4/23.Patientis chronically on anticoagulation for pulmonary embolus diagnosed in 12/2017. On IV heparin during procedure. Eliquis restarted on 4/26.  Assessment & Plan: Principal Problem:   Empyema of lung ([place]) Active Problems:   Depression with anxiety   Adenocarcinoma of right lung (HCC)   COPD (chronic obstructive pulmonary disease) ([place])   Essential hypertension   Acute on chronic respiratory failure with hypoxia (HCC)   Sepsis due to pneumonia (HCC)   History of pulmonary embolism   Empyema lung (HCC)   Malnutrition of moderate degree  Sepsis secondary to empyema in the setting of chronic effusion with prior radiation: Sepsis resolved.  - S/p decortication with chest tube placement. CT management per CT surgery - Continue augmentin to complete total of 4 weeks of therapy (had vanc/zosyn initially). Cx no growth.  - Mobilize, IS. Monitoring CXR.  AKI: Due to GI losses. Baseline Cr ~0.5, now up to 1.55 - 1.36. And becoming tachycardic. - Start IVF, recheck BMP in AM - Avoid nephrotoxins as able.   Diarrhea: Noninfectious. CDiff negative x2. Possibly antibiotic-related. ?Due to methadone withdrawal - Ok to continue prn imodium.  - Probiotic started given that he will continue augmentin for weeks.  HTN: Elevated blood pressures secondary to pain, tachycardic.On low-dose Lopressor. He may have had some mild withdrawals from  methadone which has been restarted.  Chronic low back pain:  - Methadone restarted  Tobacco use disorder - Continue chantix, no significant side effects - Brief cessation counseling provided  Anemia of chronic disease: Stable.   History of PE:  - Restarted eliquis on 4/26  DVT prophylaxis: Eliquis Code Status: Full Family Communication: None at bedside this AM Disposition Plan: Home w/HH services once stable per CT surgery  Consultants:   Cardiac and Thoracic Surgery  Procedures:   VATS right-sided decortication from right-sided empyema  Antimicrobials:  Vancomycin, zosyn - 4/30  Augmentin 4/30 >>    Subjective: Stable pain near CT site controlled. Dyspnea minimal.  Objective: Vitals:   04/04/18 2321 04/05/18 0314 04/05/18 0851 04/05/18 1135  BP: 117/68  120/67 115/75  Pulse: (!) 103  (!) 111 (!) 108  Resp: 18  18 (!) 27  Temp: 98.5 F (36.9 C) 98.3 F (36.8 C) 98.8 F (37.1 C) 98.9 F (37.2 C)  TempSrc: Oral Oral Oral Oral  SpO2: 94%  100% 92%  Weight:      Height:        Intake/Output Summary (Last 24 hours) at 04/05/2018 1436 Last data filed at 04/05/2018 1359 Gross per 24 hour  Intake 980 ml  Output 1730 ml  Net -750 ml   Filed Weights   03/25/18 0500 03/26/18 0500 03/27/18 0500  Weight: 94.3 kg (207 lb 14.3 oz) 96.3 kg (212 lb 4.9 oz) 96.3 kg (212 lb 4.8 oz)    Gen: Chronically ill-appearing male in no distress.  Pulm: Non-labored breathing room air. Diminished at bases, expiratory rhonchi on right. CV: Regular rate and rhythm. No murmur, rub, or gallop. No JVD, no pedal edema. GI: Abdomen soft, non-tender,  non-distended, with normoactive bowel sounds. No organomegaly or masses felt. Ext: Warm, no deformities Skin: Chest tube site appears without discharge, erythema, fluctuance.  Neuro: Alert and oriented. No focal neurological deficits. Psych: Judgement and insight appear normal. Mood & affect appropriate.   Data Reviewed: I have  personally reviewed following labs and imaging studies  CBC: Recent Labs  Lab 03/30/18 0223 03/31/18 0544 04/01/18 0320 04/05/18 0530  WBC 9.7 8.3 7.7 8.2  HGB 11.0* 10.3* 10.5* 9.6*  HCT 34.9* 32.8* 34.2* 30.9*  MCV 92.6 92.1 93.7 93.4  PLT 311 313 312 620   Basic Metabolic Panel: Recent Labs  Lab 03/30/18 0223 03/31/18 0544 04/01/18 0320 04/04/18 0403 04/05/18 0530  NA 141 140 141 140 140  K 3.5 3.3* 3.8 4.0 3.6  CL 103 103 105 104 105  CO2 30 30 32 29 29  GLUCOSE 100* 94 77 81 84  BUN 6 <5* 6 15 14   CREATININE 0.54* 0.66 0.69 1.55* 1.36*  CALCIUM 8.2* 8.0* 8.2* 8.0* 8.1*   GFR: Estimated Creatinine Clearance: 73.9 mL/min (A) (by C-G formula based on SCr of 1.36 mg/dL (H)). Liver Function Tests: Recent Labs  Lab 03/30/18 0223  AST 30  ALT 22  ALKPHOS 156*  BILITOT 0.7  PROT 6.3*  ALBUMIN 1.7*   No results for input(s): LIPASE, AMYLASE in the last 168 hours. No results for input(s): AMMONIA in the last 168 hours. Coagulation Profile: No results for input(s): INR, PROTIME in the last 168 hours. Cardiac Enzymes: No results for input(s): CKTOTAL, CKMB, CKMBINDEX, TROPONINI in the last 168 hours. BNP (last 3 results) No results for input(s): PROBNP in the last 8760 hours. HbA1C: No results for input(s): HGBA1C in the last 72 hours. CBG: Recent Labs  Lab 03/29/18 1610 03/29/18 1950 03/30/18 0004 03/30/18 0319 03/30/18 0748  GLUCAP 100* 123* 85 109* 124*   Lipid Profile: No results for input(s): CHOL, HDL, LDLCALC, TRIG, CHOLHDL, LDLDIRECT in the last 72 hours. Thyroid Function Tests: No results for input(s): TSH, T4TOTAL, FREET4, T3FREE, THYROIDAB in the last 72 hours. Anemia Panel: No results for input(s): VITAMINB12, FOLATE, FERRITIN, TIBC, IRON, RETICCTPCT in the last 72 hours. Urine analysis:    Component Value Date/Time   COLORURINE YELLOW 03/27/2018 1952   APPEARANCEUR CLEAR 03/27/2018 1952   LABSPEC 1.015 03/27/2018 [place] 5.0  03/27/2018 1952   GLUCOSEU NEGATIVE 03/27/2018 1952   HGBUR NEGATIVE 03/27/2018 [place] NEGATIVE 03/27/2018 [place] NEGATIVE 03/27/2018 1952   PROTEINUR NEGATIVE 03/27/2018 1952   NITRITE NEGATIVE 03/27/2018 1952   LEUKOCYTESUR NEGATIVE 03/27/2018 1952   Recent Results (from the past 240 hour(s))  Body fluid culture     Status: None   Collection Time: 03/28/18  8:59 AM  Result Value Ref Range Status   Specimen Description PLEURAL  Final   Special Requests RIGHT  Final   Gram Stain   Final    MODERATE WBC PRESENT, PREDOMINANTLY PMN NO ORGANISMS SEEN    Culture   Final    NO GROWTH 3 DAYS Performed at [facility], [address] [place], [place] [address]    Report Status 03/31/2018 FINAL  Final  Aerobic/Anaerobic Culture (surgical/deep wound)     Status: None   Collection Time: 03/28/18  9:23 AM  Result Value Ref Range Status   Specimen Description LUNG RIGHT  Final   Special Requests PLEURAL PEEL  Final   Gram Stain   Final    FEW  WBC PRESENT,BOTH PMN AND MONONUCLEAR NO ORGANISMS SEEN    Culture   Final    No growth aerobically or anaerobically. Performed at [facility], [place] [address] [place], [place] [address]    Report Status 04/02/2018 FINAL  Final  C difficile quick scan w PCR reflex     Status: None   Collection Time: 04/02/18  5:25 PM  Result Value Ref Range Status   C Diff antigen NEGATIVE NEGATIVE Final   C Diff toxin NEGATIVE NEGATIVE Final   C Diff interpretation No C. difficile detected.  Final    Comment: Performed at [facility], [place] [address] [place], [place] [address]  C difficile quick scan w PCR reflex     Status: None   Collection Time: 04/04/18  9:43 AM  Result Value Ref Range Status   C Diff antigen NEGATIVE NEGATIVE Final   C Diff toxin NEGATIVE NEGATIVE Final   C Diff interpretation No C. difficile detected.  Final      Radiology Studies: Dg Chest Port 1 View  Result Date: 04/05/2018 CLINICAL  DATA:  Chest tube, shortness of breath EXAM: PORTABLE CHEST 1 VIEW COMPARISON:  04/04/2018 FINDINGS: Right basilar chest tube remains in place with extensive subcutaneous emphysema. Persistent right volume loss in diffuse airspace disease throughout the right lung with pleural effusion and/or pleural thickening. No visible pneumothorax. Cardiomegaly. Left base atelectasis or early infiltrate, stable. IMPRESSION: Stable appearance with volume loss on the right. Pleural effusion or pleural thickening, and diffuse airspace disease throughout the aerated right lung. Extensive subcutaneous emphysema on the right is stable. No visible pneumothorax. Left base atelectasis or early infiltrate, stable. Stable cardiomegaly. Electronically Signed   By: Rolm Baptise M.D.   On: 04/05/2018 10:28   Dg Chest Port 1 View  Result Date: 04/04/2018 CLINICAL DATA:  Right-sided empyema with chest tube drainage. EXAM: PORTABLE CHEST 1 VIEW COMPARISON:  Portable chest x-ray of April 03, 2018 FINDINGS: There is chronic shift of the mediastinum toward the right. Only a small amount of aerated lung is visible on the right. The there appears to have been removal of the medial right chest tube. The lateral chest tube is in stable position over the lower hemithorax. There is no pneumothorax. There is considerable pleural thickening. The left lung is well-expanded. There is patchy density at the left lung base worrisome for atelectasis. The left heart border is normal. The pulmonary vascularity is not engorged. There is considerable subcutaneous emphysema on the right. The power port catheter tip projects over the distal third of the SVC. The left internal jugular venous catheter tip projects at the junction of the right and left brachiocephalic veins. IMPRESSION: Persistent right-sided volume loss with parenchymal consolidation and pleural thickening. The amount of aerated right lung appears slightly decreased today. Increased density at the  left lung base worrisome for atelectasis or developing pneumonia. The support tubes are in reasonable position. Large amount of subcutaneous emphysema in the right axillary region and base of the right neck. Electronically Signed   By: David  Martinique M.D.   On: 04/04/2018 09:38    Scheduled Meds: . amoxicillin-clavulanate  1 tablet Oral Q8H  . apixaban  5 mg Oral BID  . escitalopram  20 mg Oral Daily  . gabapentin  600 mg Oral BID  . guaiFENesin  1,200 mg Oral BID  . lactose free nutrition  237 mL Oral BID BM  . methadone  20 mg Oral 5 X Daily  .  mirtazapine  15 mg Oral QHS  . polyvinyl alcohol  2 drop Both Eyes Daily  . saccharomyces boulardii  250 mg Oral BID  . sodium chloride flush  10-40 mL Intracatheter Q12H  . varenicline  0.5 mg Oral BID   Continuous Infusions: . sodium chloride 10 mL/hr at 04/04/18 0300  . potassium chloride Stopped (03/31/18 0914)     LOS: 13 days   Time spent: 25 minutes.  Patrecia Pour, MD [facility] Pager [phone]  If 7PM-7AM, please contact night-coverage [email or web address] Password [email or web address] 04/05/2018, 2:36 PM

## 2018-04-05 NOTE — Progress Notes (Addendum)
      [address]       [place] [address]             [phone]      8 Days Post-Op Procedure(s) (LRB): VIDEO ASSISTED THORACOSCOPY (VATS)/EMPYEMA (Right) DECORTICATION (Right)   Subjective:  No new complaints.  Continues to have diarrhea.  Objective: Vital signs in last 24 hours: Temp:  [98.3 F (36.8 C)-98.5 F (36.9 C)] 98.3 F (36.8 C) (05/01 0314) Pulse Rate:  [103-116] 103 (04/30 2321) Cardiac Rhythm: Normal sinus rhythm (05/01 0731) Resp:  [14-18] 18 (04/30 2321) BP: (116-118)/(68-78) 117/68 (04/30 2321) SpO2:  [94 %-96 %] 94 % (04/30 2321)  Intake/Output from previous day: 04/30 0701 - 05/01 0700 In: 1000 [P.O.:480; I.V.:520] Out: 2020 [Urine:1650; Chest Tube:370]  General appearance: cooperative and no distress Heart: regular rate and rhythm Lungs: diminished breath sounds bibasilar Abdomen: soft, non-tender; bowel sounds normal; no masses,  no organomegaly Extremities: extremities normal, atraumatic, no cyanosis or edema Wound: clean and dry  Lab Results: Recent Labs    04/05/18 0530  WBC 8.2  HGB 9.6*  HCT 30.9*  PLT 239   BMET:  Recent Labs    04/04/18 0403 04/05/18 0530  NA 140 140  K 4.0 3.6  CL 104 105  CO2 29 29  GLUCOSE 81 84  BUN 15 14  CREATININE 1.55* 1.36*  CALCIUM 8.0* 8.1*    PT/INR: No results for input(s): LABPROT, INR in the last 72 hours. ABG    Component Value Date/Time   PHART 7.460 (H) 03/29/2018 0308   HCO3 34.1 (H) 03/29/2018 0308   TCO2 36 (H) 03/29/2018 0308   ACIDBASEDEF 4.0 (H) 07/19/2016 1407   O2SAT 97.0 03/29/2018 0308   CBG (last 3)  No results for input(s): GLUCAP in the last 72 hours.  Assessment/Plan: S/P Procedure(s) (LRB): VIDEO ASSISTED THORACOSCOPY (VATS)/EMPYEMA (Right) DECORTICATION (Right)  1. Chest tube- output remains stable, questionable 1+ air leak this morning, however the air leak chamber isn't completely filled, leave chest tube on water seal for now.  He may  need a new pleurovac vs. Possibly transition to a Mini Express 2. CV- hemodynamically stable, continue on Eliquis 3. Renal- creatinine stable, at 1.36 today 4. GI- diarrhea, C. Diff is negative on Immodium 5. Dispo- patient stable, leave chest tube to water seal for now.. There does not appear to be an air leak today, however chamber is not completely full... May need to transition to new pleurovac vs. Mini Express, will defer to Dr. Roxan Hockey.. Care per primary   LOS: 13 days    Erin Barrett 04/05/2018 Patient seen and examined, agree with above No air leak, but still a fair amount of drainage- will change to mini express Creatinine better On PO antibiotics will need 4 weeks total Wean O2 as tolerated  Remo Lipps C. Roxan Hockey, MD [facility] [phone]

## 2018-04-06 ENCOUNTER — Inpatient Hospital Stay (HOSPITAL_COMMUNITY): Payer: Medicare Other

## 2018-04-06 LAB — BASIC METABOLIC PANEL
Anion gap: 8 (ref 5–15)
BUN: 12 mg/dL (ref 6–20)
CALCIUM: 8.1 mg/dL — AB (ref 8.9–10.3)
CO2: 28 mmol/L (ref 22–32)
CREATININE: 1.3 mg/dL — AB (ref 0.61–1.24)
Chloride: 105 mmol/L (ref 101–111)
GFR calc non Af Amer: >60 mL/min (ref >60)
Glucose, Bld: 89 mg/dL (ref 65–99)
Potassium: 3.8 mmol/L (ref 3.5–5.1)
Sodium: 141 mmol/L (ref 135–145)

## 2018-04-06 LAB — CBC
HCT: 29.6 % — ABNORMAL LOW (ref 39.0–52.0)
Hemoglobin: 9.2 g/dL — ABNORMAL LOW (ref 13.0–17.0)
MCH: 29.1 pg (ref 26.0–34.0)
MCHC: 31.1 g/dL (ref 30.0–36.0)
MCV: 93.7 fL (ref 78.0–100.0)
Platelets: 215 10*3/uL (ref 150–400)
RBC: 3.16 MIL/uL — ABNORMAL LOW (ref 4.22–5.81)
RDW: 16.5 % — AB (ref 11.5–15.5)
WBC: 9.3 10*3/uL (ref 4.0–10.5)

## 2018-04-06 MED ORDER — SODIUM CHLORIDE 0.9 % IV SOLN
INTRAVENOUS | Status: DC
  Administered 2018-04-06: 18:00:00 via INTRAVENOUS

## 2018-04-06 NOTE — Progress Notes (Signed)
RT note: Instructed patient with use of flutter valve using the teach back method. Patient had great effort with good sputum production.

## 2018-04-06 NOTE — Progress Notes (Signed)
PROGRESS NOTE  Erik [name]  ZCH:[id number] DOB: 01-15-1961 DOA: 03/23/2018 PCP: [name], MD  Brief Narrative: Erik [name] is a 57 year old male with a history of COPD on 2 L oxygen, lung cancer, presented to [facility] with shortness of breath and fevers. 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 He was transferred to [facility] for further treatments. On intravenous antibiotics. Cardiothoracic surgeryteam proceeded with video-assisted thoracoscopy (VATS) with drainage of empyema and decortication on 4/23.Patientis chronically on anticoagulation for pulmonary embolus diagnosed in 12/2017. On IV heparin during procedure. Eliquis restarted on 4/26.  Assessment & Plan: Principal Problem:   Empyema of lung ([place]) Active Problems:   Depression with anxiety   Adenocarcinoma of right lung (HCC)   COPD (chronic obstructive pulmonary disease) ([place])   Essential hypertension   Acute on chronic respiratory failure with hypoxia (HCC)   Sepsis due to pneumonia (HCC)   History of pulmonary embolism   Empyema lung (HCC)   Malnutrition of moderate degree  Sepsis secondary to empyema in the setting of chronic effusion with prior radiation: Sepsis resolved.  - S/p decortication with chest tube placement. CT management per CT surgery - Continue augmentin to complete total of 4 weeks of therapy (had vanc/zosyn initially). Cx no growth.  - Mobilize, IS. Monitoring CXR.  AKI: Due to GI losses. Baseline Cr ~0.5, now up to 1.55 - 1.36. And becoming tachycardic. - Slight improvement. Continue IVF, recheck BMP again in AM - Avoid nephrotoxins as able.   Diarrhea: Noninfectious. CDiff negative x2. Possibly antibiotic-related. ?Due to methadone withdrawal. Improving some. - Ok to continue prn imodium.  - Probiotic started given that he will continue augmentin for weeks.  HTN: Elevated blood pressures secondary to pain, tachycardic.On  low-dose Lopressor. He may have had some mild withdrawals from methadone which has been restarted.  Chronic low back pain:  - Methadone restarted  Tobacco use disorder - Continue chantix, no significant side effects - Brief cessation counseling provided  Anemia of chronic disease: Has been trending downward, likely blood loss from chest tube.  - Check CBC in AM and will need repeat at follow up if stable in AM  History of PE:  - Restarted eliquis on 4/26  DVT prophylaxis: Eliquis Code Status: Full Family Communication: None at bedside this AM Disposition Plan: Home w/HH services once stable per CT surgery  Consultants:   Cardiac and Thoracic Surgery  Procedures:   VATS right-sided decortication from right-sided empyema  Antimicrobials:  Vancomycin, zosyn - 4/30  Augmentin 4/30 >>    Subjective: No changes, sputum output decreased, but feels like he needs to cough it up. Dyspnea minimal. Diarrhea improving.  Objective: Vitals:   04/06/18 0758 04/06/18 0813 04/06/18 1201 04/06/18 1543  BP: 132/89  137/75 131/78  Pulse: (!) 106 (!) 115 (!) 107 (!) 107  Resp: 14  18 (!) 22  Temp: 98.5 F (36.9 C)  98.7 F (37.1 C) 99.7 F (37.6 C)  TempSrc: Oral  Oral Oral  SpO2: 97% 94% 96% 95%  Weight:      Height:        Intake/Output Summary (Last 24 hours) at 04/06/2018 1640 Last data filed at 04/06/2018 1544 Gross per 24 hour  Intake 2122 ml  Output 3085 ml  Net -963 ml   Filed Weights   03/25/18 0500 03/26/18 0500 03/27/18 0500  Weight: 94.3 kg (207 lb 14.3 oz) 96.3 kg (212 lb 4.9 oz) 96.3 kg (212 lb 4.8  oz)    Gen: Chronically ill-appearing male in no distress.  Pulm: Non-labored breathing room air. Diminished at bases, expiratory rhonchi on right. CV: Regular rate and rhythm. No murmur, rub, or gallop. No JVD, no pedal edema. GI: Abdomen soft, non-tender, non-distended, with normoactive bowel sounds. No organomegaly or masses felt. Ext: Warm, no  deformities Skin: Chest tube site appears without discharge, erythema, fluctuance.  Neuro: Alert and oriented. No focal neurological deficits. Psych: Judgement and insight appear normal. Mood & affect appropriate.   Data Reviewed: I have personally reviewed following labs and imaging studies  CBC: Recent Labs  Lab 03/31/18 0544 04/01/18 0320 04/05/18 0530 04/06/18 0254  WBC 8.3 7.7 8.2 9.3  HGB 10.3* 10.5* 9.6* 9.2*  HCT 32.8* 34.2* 30.9* 29.6*  MCV 92.1 93.7 93.4 93.7  PLT 313 312 239 599   Basic Metabolic Panel: Recent Labs  Lab 03/31/18 0544 04/01/18 0320 04/04/18 0403 04/05/18 0530 04/06/18 0254  NA 140 141 140 140 141  K 3.3* 3.8 4.0 3.6 3.8  CL 103 105 104 105 105  CO2 30 32 29 29 28   GLUCOSE 94 77 81 84 89  BUN <5* 6 15 14 12   CREATININE 0.66 0.69 1.55* 1.36* 1.30*  CALCIUM 8.0* 8.2* 8.0* 8.1* 8.1*   GFR: Estimated Creatinine Clearance: 77.3 mL/min (A) (by C-G formula based on SCr of 1.3 mg/dL (H)). Liver Function Tests: No results for input(s): AST, ALT, ALKPHOS, BILITOT, PROT, ALBUMIN in the last 168 hours. No results for input(s): LIPASE, AMYLASE in the last 168 hours. No results for input(s): AMMONIA in the last 168 hours. Coagulation Profile: No results for input(s): INR, PROTIME in the last 168 hours. Cardiac Enzymes: No results for input(s): CKTOTAL, CKMB, CKMBINDEX, TROPONINI in the last 168 hours. BNP (last 3 results) No results for input(s): PROBNP in the last 8760 hours. HbA1C: No results for input(s): HGBA1C in the last 72 hours. CBG: No results for input(s): GLUCAP in the last 168 hours. Lipid Profile: No results for input(s): CHOL, HDL, LDLCALC, TRIG, CHOLHDL, LDLDIRECT in the last 72 hours. Thyroid Function Tests: No results for input(s): TSH, T4TOTAL, FREET4, T3FREE, THYROIDAB in the last 72 hours. Anemia Panel: No results for input(s): VITAMINB12, FOLATE, FERRITIN, TIBC, IRON, RETICCTPCT in the last 72 hours. Urine analysis:     Component Value Date/Time   COLORURINE YELLOW 03/27/2018 1952   APPEARANCEUR CLEAR 03/27/2018 1952   LABSPEC 1.015 03/27/2018 [place] 5.0 03/27/2018 1952   GLUCOSEU NEGATIVE 03/27/2018 1952   HGBUR NEGATIVE 03/27/2018 [place] NEGATIVE 03/27/2018 [place] NEGATIVE 03/27/2018 1952   PROTEINUR NEGATIVE 03/27/2018 1952   NITRITE NEGATIVE 03/27/2018 1952   LEUKOCYTESUR NEGATIVE 03/27/2018 1952   Recent Results (from the past 240 hour(s))  Body fluid culture     Status: None   Collection Time: 03/28/18  8:59 AM  Result Value Ref Range Status   Specimen Description PLEURAL  Final   Special Requests RIGHT  Final   Gram Stain   Final    MODERATE WBC PRESENT, PREDOMINANTLY PMN NO ORGANISMS SEEN    Culture   Final    NO GROWTH 3 DAYS Performed at [facility], [address], [place], [place] [address]    Report Status 03/31/2018 FINAL  Final  Aerobic/Anaerobic Culture (surgical/deep wound)     Status: None   Collection Time: 03/28/18  9:23 AM  Result Value Ref Range Status   Specimen Description LUNG RIGHT  Final  Special Requests PLEURAL PEEL  Final   Gram Stain   Final    FEW WBC PRESENT,BOTH PMN AND MONONUCLEAR NO ORGANISMS SEEN    Culture   Final    No growth aerobically or anaerobically. Performed at [facility], [place] [address], [place], [place] [address]    Report Status 04/02/2018 FINAL  Final  C difficile quick scan w PCR reflex     Status: None   Collection Time: 04/02/18  5:25 PM  Result Value Ref Range Status   C Diff antigen NEGATIVE NEGATIVE Final   C Diff toxin NEGATIVE NEGATIVE Final   C Diff interpretation No C. difficile detected.  Final    Comment: Performed at [facility], [place] [address] [place], [place] [address]  C difficile quick scan w PCR reflex     Status: None   Collection Time: 04/04/18  9:43 AM  Result Value Ref Range Status   C Diff antigen NEGATIVE NEGATIVE Final   C Diff toxin NEGATIVE NEGATIVE  Final   C Diff interpretation No C. difficile detected.  Final      Radiology Studies: Dg Chest Port 1 View  Result Date: 04/06/2018 CLINICAL DATA:  Short of breath, history of empyema EXAM: PORTABLE CHEST 1 VIEW COMPARISON:  Portable chest x-ray of 04/05/2018 and CT chest of 03/23/2017 FINDINGS: There is little change in considerable volume loss on the right with opacity throughout the right hemithorax consistent with effusion, possible empyema as noted previously as well as atelectasis. A right chest tube remains. The left lung is clear, with only minimal markings at the left lung base possibly due to atelectasis. Cardiomegaly is stable. Right chest wall subcutaneous air is again noted. Right central venous line tip overlies expected SVC-RA junction. IMPRESSION: 1. Little change in considerable volume loss of the right lung with opacity throughout the right hemithorax consistent with right pleural effusion, perhaps empyema, and pneumonia cannot be excluded. Right chest tube remains. 2. Minimally prominent markings at the left lung base most likely due atelectasis. 3. Right chest tube remains. Electronically Signed   By: Ivar Drape M.D.   On: 04/06/2018 09:24   Dg Chest Port 1 View  Result Date: 04/05/2018 CLINICAL DATA:  Chest tube, shortness of breath EXAM: PORTABLE CHEST 1 VIEW COMPARISON:  04/04/2018 FINDINGS: Right basilar chest tube remains in place with extensive subcutaneous emphysema. Persistent right volume loss in diffuse airspace disease throughout the right lung with pleural effusion and/or pleural thickening. No visible pneumothorax. Cardiomegaly. Left base atelectasis or early infiltrate, stable. IMPRESSION: Stable appearance with volume loss on the right. Pleural effusion or pleural thickening, and diffuse airspace disease throughout the aerated right lung. Extensive subcutaneous emphysema on the right is stable. No visible pneumothorax. Left base atelectasis or early infiltrate, stable.  Stable cardiomegaly. Electronically Signed   By: Rolm Baptise M.D.   On: 04/05/2018 10:28    Scheduled Meds: . amoxicillin-clavulanate  1 tablet Oral Q8H  . apixaban  5 mg Oral BID  . escitalopram  20 mg Oral Daily  . gabapentin  600 mg Oral BID  . guaiFENesin  1,200 mg Oral BID  . lactose free nutrition  237 mL Oral BID BM  . methadone  20 mg Oral 5 X Daily  . mirtazapine  15 mg Oral QHS  . polyvinyl alcohol  2 drop Both Eyes Daily  . saccharomyces boulardii  250 mg Oral BID  . sodium chloride flush  10-40 mL Intracatheter Q12H  .  varenicline  0.5 mg Oral BID   Continuous Infusions: . sodium chloride 10 mL/hr at 04/04/18 0300  . potassium chloride Stopped (03/31/18 0914)     LOS: 14 days   Time spent: 25 minutes.  Patrecia Pour, MD [facility] Pager [phone]  If 7PM-7AM, please contact night-coverage [email or web address] Password [email or web address] 04/06/2018, 4:40 PM

## 2018-04-06 NOTE — Care Management Note (Addendum)
Case Management Note Previous Note Created by  Marvetta Gibbons RN, BSN Unit 4E-Case Manager-- [place] coverage [phone]  Patient Details  Name: Erik [name] MRN: [id number] Date of Birth: May 01, 1961  Subjective/Objective:  Pt admitted with empyema of lung-(hx of COPD, acute on chronic resp. Failure, lung cancer) tx to 2H from 2W s/p VATS on 03/28/18                  Action/Plan: PTA pt lived at home-independent , has home 02, CM to follow post op for transition of care needs.   Expected Discharge Date:                  Expected Discharge Plan:  [place]  In-House Referral:     Discharge planning Services  CM Consult  Post Acute Care Choice:    Choice offered to:  Patient  DME Arranged:    DME Agency:     HH Arranged:    [place] Agency:     Status of Service:  In process, will continue to follow  If discussed at Long Length of Stay Meetings, dates discussed:    Discharge Disposition:   Additional Comments: 04/06/2018  Pt now has mini express.  Pt given HH choice - pt chose [facility] - CM requested [place] orders so referral can be given to [facility].  Pt states he is on 3 liters home oxygen supplied by [employer] - pt informed CM that he has portable oxygen tank for transport home.  Pt plans to transport home via private vehicle  04/05/18 Pt now on SD unit.  CM was informed by 2H unit CM that pt may be appropriate for [facility] referral next week depending upon progression - CM will continue to follow  Maryclare Labrador, RN 04/06/2018, 3:13 PM

## 2018-04-06 NOTE — Progress Notes (Signed)
Physical Therapy Treatment Patient Details Name: Erik [name] MRN: [id number] DOB: September 19, 1961 Today's Date: 04/06/2018    History of Present Illness 57 yo admitted with SOB and Rt empyema s/p VATS 4/23 with sepsis acutely. PMhx: COPD on home O2, lung CA, HTN    PT Comments    Pt pleasant and able to walk without holding onto IV pole, gait limited by toileting needs but able to complete stairs and maintain SpO2>90% on 3L throughout gait. Will continue to follow and recommended continued gait with nursing and for pt to perform HEP on his own.   Follow Up Recommendations  No PT follow up     Equipment Recommendations  None recommended by PT    Recommendations for Other Services       Precautions / Restrictions Precautions Precaution Comments: chest tube    Mobility  Bed Mobility Overal bed mobility: Modified Independent                Transfers Overall transfer level: Modified independent                  Ambulation/Gait Ambulation/Gait assistance: Supervision Ambulation Distance (Feet): 200 Feet Assistive device: None Gait Pattern/deviations: Step-through pattern;Decreased stride length   Gait velocity interpretation: 1.31 - 2.62 ft/sec, indicative of limited community ambulator General Gait Details: pt with steady gait without use of IV pole with one partial LOB with directional change. SpO2 90-95% on 3L. Limited by need to void   Stairs Stairs: Yes Stairs assistance: Modified independent (Device/Increase time) Stair Management: Alternating pattern;One rail Left;Forwards Number of Stairs: 3 General stair comments: pt able to complete with partial use of rail and no LOB   Wheelchair Mobility    Modified Rankin (Stroke Patients Only)       Balance Overall balance assessment: Needs assistance   Sitting balance-Leahy Scale: Good       Standing balance-Leahy Scale: Good Standing balance comment: one partial LOB during gait with turning                             Cognition Arousal/Alertness: Awake/alert Behavior During Therapy: WFL for tasks assessed/performed Overall Cognitive Status: Within Functional Limits for tasks assessed                                        Exercises      General Comments        Pertinent Vitals/Pain Pain Assessment: No/denies pain    Home Living                      Prior Function            PT Goals (current goals can now be found in the care plan section) Progress towards PT goals: Progressing toward goals    Frequency           PT Plan Current plan remains appropriate    Co-evaluation              AM-PAC PT "6 Clicks" Daily Activity  Outcome Measure  Difficulty turning over in bed (including adjusting bedclothes, sheets and blankets)?: None Difficulty moving from lying on back to sitting on the side of the bed? : None Difficulty sitting down on and standing up from a chair with arms (e.g., wheelchair, bedside commode, etc,.)?: None Help needed moving  to and from a bed to chair (including a wheelchair)?: None Help needed walking in hospital room?: A Little Help needed climbing 3-5 steps with a railing? : None 6 Click Score: 23    End of Session Equipment Utilized During Treatment: Oxygen Activity Tolerance: Patient tolerated treatment well Patient left: in chair;with call bell/phone within reach Nurse Communication: Mobility status PT Visit Diagnosis: Other abnormalities of gait and mobility (R26.89)     Time: 1245-8099 PT Time Calculation (min) (ACUTE ONLY): 18 min  Charges:  $Gait Training: 8-22 mins                    G Codes:       Elwyn Reach, [place]    [place] 04/06/2018, 8:17 AM

## 2018-04-06 NOTE — Progress Notes (Addendum)
      [address]       [place] [address]             [phone]      9 Days Post-Op Procedure(s) (LRB): VIDEO ASSISTED THORACOSCOPY (VATS)/EMPYEMA (Right) DECORTICATION (Right)   Subjective:   Difficulty coughing up phlegm this morning.  Objective: Vital signs in last 24 hours: Temp:  [98.3 F (36.8 C)-98.9 F (37.2 C)] 98.5 F (36.9 C) (05/02 0758) Pulse Rate:  [101-121] 115 (05/02 0813) Cardiac Rhythm: Sinus tachycardia (05/02 0700) Resp:  [14-27] 14 (05/02 0758) BP: (105-135)/(67-89) 132/89 (05/02 0758) SpO2:  [90 %-100 %] 94 % (05/02 0813)  Intake/Output from previous day: 05/01 0701 - 05/02 0700 In: 1747.5 [P.O.:360; I.V.:1387.5] Out: 2760 [Urine:2600; Chest Tube:160]  General appearance: alert, cooperative and no distress Heart: regular rate and rhythm Lungs: rhonchi bilaterally Abdomen: soft, non-tender; bowel sounds normal; no masses,  no organomegaly Wound: clean, some serous drainge present  Lab Results: Recent Labs    04/05/18 0530 04/06/18 0254  WBC 8.2 9.3  HGB 9.6* 9.2*  HCT 30.9* 29.6*  PLT 239 215   BMET:  Recent Labs    04/05/18 0530 04/06/18 0254  NA 140 141  K 3.6 3.8  CL 105 105  CO2 29 28  GLUCOSE 84 89  BUN 14 12  CREATININE 1.36* 1.30*  CALCIUM 8.1* 8.1*    PT/INR: No results for input(s): LABPROT, INR in the last 72 hours. ABG    Component Value Date/Time   PHART 7.460 (H) 03/29/2018 0308   HCO3 34.1 (H) 03/29/2018 0308   TCO2 36 (H) 03/29/2018 0308   ACIDBASEDEF 4.0 (H) 07/19/2016 1407   O2SAT 97.0 03/29/2018 0308   CBG (last 3)  No results for input(s): GLUCAP in the last 72 hours.  Assessment/Plan: S/P Procedure(s) (LRB): VIDEO ASSISTED THORACOSCOPY (VATS)/EMPYEMA (Right) DECORTICATION (Right)  1. Chest tube- transitioned to Mini Express yesterday, low output, no air leak appreciated-- CXR shows worsening aeration, continued sub q emphysema, post surgical changes 2. Surgical incision-  healing well, there are staples in place.. Tomorrow they have been in place for 10 days, may be able to start removing soon 3. Dispo- care per primary   LOS: 14 days    Erin Barrett 04/06/2018 Patient seen and examined, agree with above His drainage has decreased, if < 200 tomorrow will consider dc'ing CT Agree that CXR looks a little worse today- will add flutter valve  Remo Lipps C. Roxan Hockey, MD [facility] [phone]

## 2018-04-07 ENCOUNTER — Inpatient Hospital Stay (HOSPITAL_COMMUNITY): Payer: Medicare Other

## 2018-04-07 LAB — BASIC METABOLIC PANEL
ANION GAP: 5 (ref 5–15)
BUN: 9 mg/dL (ref 6–20)
CALCIUM: 8.1 mg/dL — AB (ref 8.9–10.3)
CO2: 30 mmol/L (ref 22–32)
Chloride: 108 mmol/L (ref 101–111)
Creatinine, Ser: 1.22 mg/dL (ref 0.61–1.24)
GFR calc Af Amer: >60 mL/min (ref >60)
GLUCOSE: 81 mg/dL (ref 65–99)
Potassium: 3.7 mmol/L (ref 3.5–5.1)
Sodium: 143 mmol/L (ref 135–145)

## 2018-04-07 LAB — CBC
HCT: 30.4 % — ABNORMAL LOW (ref 39.0–52.0)
HEMOGLOBIN: 9.2 g/dL — AB (ref 13.0–17.0)
MCH: 28.8 pg (ref 26.0–34.0)
MCHC: 30.3 g/dL (ref 30.0–36.0)
MCV: 95 fL (ref 78.0–100.0)
Platelets: 225 10*3/uL (ref 150–400)
RBC: 3.2 MIL/uL — ABNORMAL LOW (ref 4.22–5.81)
RDW: 16.7 % — AB (ref 11.5–15.5)
WBC: 8.5 10*3/uL (ref 4.0–10.5)

## 2018-04-07 NOTE — Progress Notes (Signed)
Physical Therapy Treatment Patient Details Name: Erik [name] MRN: [id number] DOB: 11/20/1961 Today's Date: 04/07/2018    History of Present Illness 57 yo admitted with SOB and Rt empyema s/p VATS 4/23 with sepsis acutely. PMhx: COPD on home O2, lung CA, HTN    PT Comments    Pt remains very pleasant and happy for hopeful D/C home tomorrow. Pt able to increase gait distance on 3L and perform all necessary transfers for return home. Pt encouraged to continue walking with staff acutely and maintain HEP at home.     Follow Up Recommendations  No PT follow up     Equipment Recommendations  None recommended by PT    Recommendations for Other Services       Precautions / Restrictions Precautions Precautions: Fall Precaution Comments: chest tube Restrictions Weight Bearing Restrictions: No    Mobility  Bed Mobility Overal bed mobility: Modified Independent                Transfers Overall transfer level: Modified independent                  Ambulation/Gait Ambulation/Gait assistance: Supervision Ambulation Distance (Feet): 350 Feet Assistive device: None Gait Pattern/deviations: Step-through pattern;Decreased stride length   Gait velocity interpretation: 1.31 - 2.62 ft/sec, indicative of limited community ambulator General Gait Details: pt with steady gait with clear hall ambulation, one partial LOB with head turns with gait with pt able to self correct. 2 standing rest breaks with pt able to self regulate and decrease speed for respiratory function. SpO2 90-94% on 3L with gait   Stairs             Wheelchair Mobility    Modified Rankin (Stroke Patients Only)       Balance Overall balance assessment: Needs assistance   Sitting balance-Leahy Scale: Good       Standing balance-Leahy Scale: Good                              Cognition Arousal/Alertness: Awake/alert Behavior During Therapy: WFL for tasks  assessed/performed Overall Cognitive Status: Within Functional Limits for tasks assessed                                        Exercises General Exercises - Lower Extremity Long Arc Quad: AROM;Seated;Both;15 reps Hip Flexion/Marching: Both;AROM;15 reps;Seated    General Comments        Pertinent Vitals/Pain Pain Assessment: No/denies pain    Home Living                      Prior Function            PT Goals (current goals can now be found in the care plan section) Progress towards PT goals: Progressing toward goals    Frequency           PT Plan Current plan remains appropriate    Co-evaluation              AM-PAC PT "6 Clicks" Daily Activity  Outcome Measure  Difficulty turning over in bed (including adjusting bedclothes, sheets and blankets)?: None Difficulty moving from lying on back to sitting on the side of the bed? : None Difficulty sitting down on and standing up from a chair with arms (e.g., wheelchair, bedside commode, etc,.)?: None Help  needed moving to and from a bed to chair (including a wheelchair)?: None Help needed walking in hospital room?: A Little Help needed climbing 3-5 steps with a railing? : None 6 Click Score: 23    End of Session Equipment Utilized During Treatment: Oxygen Activity Tolerance: Patient tolerated treatment well Patient left: in chair;with call bell/phone within reach Nurse Communication: Mobility status PT Visit Diagnosis: Other abnormalities of gait and mobility (R26.89)     Time: 6438-3779 PT Time Calculation (min) (ACUTE ONLY): 28 min  Charges:  $Gait Training: 8-22 mins $Therapeutic Activity: 8-22 mins                    G Codes:       Elwyn Reach, PT [phone]    [place] 04/07/2018, 9:02 AM

## 2018-04-07 NOTE — Progress Notes (Signed)
PROGRESS NOTE  Erik [name]  XBM:[id number] DOB: 07-Feb-1961 DOA: 03/23/2018 PCP: [name], MD  Brief Narrative: Erik [name] is a 57 year old male with a history of COPD on 2 L oxygen, lung cancer, presented to [facility] with shortness of breath and fevers. 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 He was transferred to [facility] for further treatments. On intravenous antibiotics. Cardiothoracic surgeryteam proceeded with video-assisted thoracoscopy (VATS) with drainage of empyema and decortication on 4/23.Patientis chronically on anticoagulation for pulmonary embolus diagnosed in 12/2017. On IV heparin during procedure. Eliquis restarted on 4/26. Final chest tube removed 5/3.   Assessment & Plan: Principal Problem:   Empyema of lung ([place]) Active Problems:   Depression with anxiety   Adenocarcinoma of right lung (HCC)   COPD (chronic obstructive pulmonary disease) ([place])   Essential hypertension   Acute on chronic respiratory failure with hypoxia (HCC)   Sepsis due to pneumonia (HCC)   History of pulmonary embolism   Empyema lung (HCC)   Malnutrition of moderate degree  Sepsis secondary to empyema in the setting of chronic effusion with prior radiation: Sepsis resolved.  - S/p decortication with chest tube placement. CT surgery managed chest tubes, with final removal 5/3.  - Continue augmentin through 5/15 to complete total of 4 weeks of therapy (had vanc/zosyn 4/18-4/29 initially). Cx no growth.  - Mobilize, IS. Monitoring CXR.  AKI: Due to GI losses. Baseline Cr ~0.5, now up to 1.55 - 1.36. And becoming tachycardic. - Improved with IV fluids. Plan to monitor at follow up, push po intake. - Avoid nephrotoxins as able.   Diarrhea: Noninfectious. CDiff negative x2. Possibly antibiotic-related. ?Due to methadone withdrawal. Now resolved. - Ok to continue prn imodium.  - Probiotic started and will be continued given that  he will continue augmentin for weeks.  HTN:  - Improved control. Continue home medications  Chronic low back pain:  - Methadone restarted  Tobacco use disorder - Continue chantix, no significant side effects - Brief cessation counseling provided  Anemia of chronic disease: Initially trended downward, now stable, likely blood loss from chest tube.  - Check CBC at follow up  History of PE:  - Restarted eliquis on 4/26  DVT prophylaxis: Eliquis Code Status: Full Family Communication: None at bedside this AM Disposition Plan: Home w/HH services once stable per CT surgery, likely 5/4.  Consultants:   Cardiac and Thoracic Surgery  Procedures:   VATS right-sided decortication from right-sided empyema by Dr. Roxan Hockey 4/23.  Antimicrobials:  Vancomycin, zosyn 4/18 - 4/29  Augmentin 4/30 - 5/15  Subjective: Has been using flutter valve, getting up without issues with PT. Dyspnea improving. Taking plenty of PO and having formed stools.   Objective: Vitals:   04/06/18 2333 04/07/18 0347 04/07/18 0824 04/07/18 0900  BP: 126/72 (!) 141/85 124/80   Pulse: (!) 101 (!) 107 (!) 102 (!) 112  Resp: 15 17 (!) 22   Temp: 98.5 F (36.9 C) 98.1 F (36.7 C) 98.3 F (36.8 C)   TempSrc: Oral Oral Oral   SpO2: 96% 96% 100% 94%  Weight:      Height:        Intake/Output Summary (Last 24 hours) at 04/07/2018 0936 Last data filed at 04/07/2018 0829 Gross per 24 hour  Intake 1810.42 ml  Output 3150 ml  Net -1339.58 ml   Filed Weights   03/25/18 0500 03/26/18 0500 03/27/18 0500  Weight: 94.3 kg (207 lb 14.3 oz) 96.3 kg (  212 lb 4.9 oz) 96.3 kg (212 lb 4.8 oz)    Gen: Chronically ill-appearing pleasant male in no distress.  Pulm: Non-labored breathing room air. Diminished at bases, expiratory rhonchi stable R > L. No crackles. CV: Regular rate and rhythm. No murmur, rub, or gallop. No JVD, no pedal edema. GI: Abdomen soft, non-tender, non-distended, with normoactive bowel  sounds. No organomegaly or masses felt. Ext: Warm, no deformities Skin: Chest tube site appears without discharge, erythema, fluctuance.  Neuro: Alert and oriented. No focal neurological deficits. Psych: Judgement and insight appear normal. Mood & affect appropriate.   Data Reviewed: I have personally reviewed following labs and imaging studies  CBC: Recent Labs  Lab 04/01/18 0320 04/05/18 0530 04/06/18 0254 04/07/18 0530  WBC 7.7 8.2 9.3 8.5  HGB 10.5* 9.6* 9.2* 9.2*  HCT 34.2* 30.9* 29.6* 30.4*  MCV 93.7 93.4 93.7 95.0  PLT 312 239 215 332   Basic Metabolic Panel: Recent Labs  Lab 04/01/18 0320 04/04/18 0403 04/05/18 0530 04/06/18 0254 04/07/18 0530  NA 141 140 140 141 143  K 3.8 4.0 3.6 3.8 3.7  CL 105 104 105 105 108  CO2 32 29 29 28 30   GLUCOSE 77 81 84 89 81  BUN 6 15 14 12 9   CREATININE 0.69 1.55* 1.36* 1.30* 1.22  CALCIUM 8.2* 8.0* 8.1* 8.1* 8.1*   GFR: Estimated Creatinine Clearance: 82.3 mL/min (by C-G formula based on SCr of 1.22 mg/dL). Liver Function Tests: No results for input(s): AST, ALT, ALKPHOS, BILITOT, PROT, ALBUMIN in the last 168 hours. No results for input(s): LIPASE, AMYLASE in the last 168 hours. No results for input(s): AMMONIA in the last 168 hours. Coagulation Profile: No results for input(s): INR, PROTIME in the last 168 hours. Cardiac Enzymes: No results for input(s): CKTOTAL, CKMB, CKMBINDEX, TROPONINI in the last 168 hours. BNP (last 3 results) No results for input(s): PROBNP in the last 8760 hours. HbA1C: No results for input(s): HGBA1C in the last 72 hours. CBG: No results for input(s): GLUCAP in the last 168 hours. Lipid Profile: No results for input(s): CHOL, HDL, LDLCALC, TRIG, CHOLHDL, LDLDIRECT in the last 72 hours. Thyroid Function Tests: No results for input(s): TSH, T4TOTAL, FREET4, T3FREE, THYROIDAB in the last 72 hours. Anemia Panel: No results for input(s): VITAMINB12, FOLATE, FERRITIN, TIBC, IRON, RETICCTPCT in  the last 72 hours. Urine analysis:    Component Value Date/Time   COLORURINE YELLOW 03/27/2018 1952   APPEARANCEUR CLEAR 03/27/2018 1952   LABSPEC 1.015 03/27/2018 [place] 5.0 03/27/2018 1952   GLUCOSEU NEGATIVE 03/27/2018 1952   HGBUR NEGATIVE 03/27/2018 [place] NEGATIVE 03/27/2018 [place] NEGATIVE 03/27/2018 1952   PROTEINUR NEGATIVE 03/27/2018 1952   NITRITE NEGATIVE 03/27/2018 1952   LEUKOCYTESUR NEGATIVE 03/27/2018 1952   Recent Results (from the past 240 hour(s))  C difficile quick scan w PCR reflex     Status: None   Collection Time: 04/02/18  5:25 PM  Result Value Ref Range Status   C Diff antigen NEGATIVE NEGATIVE Final   C Diff toxin NEGATIVE NEGATIVE Final   C Diff interpretation No C. difficile detected.  Final    Comment: Performed at [facility], [place] [address] [place], [place] [address]  C difficile quick scan w PCR reflex     Status: None   Collection Time: 04/04/18  9:43 AM  Result Value Ref Range Status   C Diff antigen NEGATIVE NEGATIVE Final   C Diff toxin  NEGATIVE NEGATIVE Final   C Diff interpretation No C. difficile detected.  Final      Radiology Studies: Dg Chest Port 1 View  Result Date: 04/07/2018 CLINICAL DATA:  Chest tube, empyema, shortness of Breath EXAM: PORTABLE CHEST 1 VIEW COMPARISON:  04/06/2018 FINDINGS: Right Port-A-Cath and chest tube remain in place, unchanged. Pleural thickening and effusion on the right with diffuse right lung airspace disease and volume loss again noted, unchanged. Stable left base atelectasis or infiltrate. Cardiomegaly. Stable right chest wall subcutaneous emphysema. IMPRESSION: No significant change since prior study. Electronically Signed   By: Rolm Baptise M.D.   On: 04/07/2018 09:19   Dg Chest Port 1 View  Result Date: 04/06/2018 CLINICAL DATA:  Short of breath, history of empyema EXAM: PORTABLE CHEST 1 VIEW COMPARISON:  Portable chest x-ray of 04/05/2018 and CT chest of  03/23/2017 FINDINGS: There is little change in considerable volume loss on the right with opacity throughout the right hemithorax consistent with effusion, possible empyema as noted previously as well as atelectasis. A right chest tube remains. The left lung is clear, with only minimal markings at the left lung base possibly due to atelectasis. Cardiomegaly is stable. Right chest wall subcutaneous air is again noted. Right central venous line tip overlies expected SVC-RA junction. IMPRESSION: 1. Little change in considerable volume loss of the right lung with opacity throughout the right hemithorax consistent with right pleural effusion, perhaps empyema, and pneumonia cannot be excluded. Right chest tube remains. 2. Minimally prominent markings at the left lung base most likely due atelectasis. 3. Right chest tube remains. Electronically Signed   By: Ivar Drape M.D.   On: 04/06/2018 09:24    Scheduled Meds: . amoxicillin-clavulanate  1 tablet Oral Q8H  . apixaban  5 mg Oral BID  . escitalopram  20 mg Oral Daily  . gabapentin  600 mg Oral BID  . guaiFENesin  1,200 mg Oral BID  . lactose free nutrition  237 mL Oral BID BM  . methadone  20 mg Oral 5 X Daily  . mirtazapine  15 mg Oral QHS  . polyvinyl alcohol  2 drop Both Eyes Daily  . saccharomyces boulardii  250 mg Oral BID  . sodium chloride flush  10-40 mL Intracatheter Q12H  . varenicline  0.5 mg Oral BID   Continuous Infusions: . sodium chloride 10 mL/hr at 04/04/18 0300  . potassium chloride Stopped (03/31/18 0914)     LOS: 15 days   Time spent: 25 minutes.  Patrecia Pour, MD [facility] Pager [phone]  If 7PM-7AM, please contact night-coverage [email or web address] Password [facility] 04/07/2018, 9:36 AM

## 2018-04-07 NOTE — Care Management Important Message (Signed)
Important Message  Patient Details  Name: Erik [name] MRN: [id number] Date of Birth: 05-22-1961   Medicare Important Message Given:  Yes    Vergia Chea P Latesha Chesney 04/07/2018, 2:49 PM

## 2018-04-07 NOTE — Progress Notes (Signed)
DC chest tube. Suture pre-knotted, unable to close gap. Placed steri strip on open site, covered with gauze and tape. VSS. Rad called to perform same day CXR.   Deaccessed R chest portacath. No complications. Bandaid placed over site. VSS.

## 2018-04-07 NOTE — Progress Notes (Signed)
Patient educated on importance of ambulation during recovery, continues to refuse walking this evening. Will continue to attempt.

## 2018-04-07 NOTE — Progress Notes (Signed)
10 Days Post-Op Procedure(s) (LRB): VIDEO ASSISTED THORACOSCOPY (VATS)/EMPYEMA (Right) DECORTICATION (Right) Subjective: Feels better. Formed BM yesterday PM, none since   Objective: Vital signs in last 24 hours: Temp:  [98.1 F (36.7 C)-99.7 F (37.6 C)] 98.1 F (36.7 C) (05/03 0347) Pulse Rate:  [101-112] 107 (05/03 0347) Cardiac Rhythm: Sinus tachycardia (05/03 0400) Resp:  [14-22] 17 (05/03 0347) BP: (126-141)/(72-85) 141/85 (05/03 0347) SpO2:  [95 %-96 %] 96 % (05/03 0347)  Hemodynamic parameters for last 24 hours:    Intake/Output from previous day: 05/02 0701 - 05/03 0700 In: 1824.5 [I.V.:1824.5] Out: 3300 [Urine:3250; Chest Tube:50] Intake/Output this shift: No intake/output data recorded.  General appearance: alert, cooperative and no distress Neurologic: intact Heart: regular rate and rhythm Lungs: diminished breath sounds on right Wound: clean and dry no air leak  Lab Results: Recent Labs    04/06/18 0254 04/07/18 0530  WBC 9.3 8.5  HGB 9.2* 9.2*  HCT 29.6* 30.4*  PLT 215 225   BMET:  Recent Labs    04/06/18 0254 04/07/18 0530  NA 141 143  K 3.8 3.7  CL 105 108  CO2 28 30  GLUCOSE 89 81  BUN 12 9  CREATININE 1.30* 1.22  CALCIUM 8.1* 8.1*    PT/INR: No results for input(s): LABPROT, INR in the last 72 hours. ABG    Component Value Date/Time   PHART 7.460 (H) 03/29/2018 0308   HCO3 34.1 (H) 03/29/2018 0308   TCO2 36 (H) 03/29/2018 0308   ACIDBASEDEF 4.0 (H) 07/19/2016 1407   O2SAT 97.0 03/29/2018 0308   CBG (last 3)  No results for input(s): GLUCAP in the last 72 hours.  Assessment/Plan: S/P Procedure(s) (LRB): VIDEO ASSISTED THORACOSCOPY (VATS)/EMPYEMA (Right) DECORTICATION (Right) -CV- stable  RESP- no air leak, CT output down- will dc chest tube today  CXR shows significant volume loss on right, some residual pleural effusion, but tolerating well  ID- continue Augmentin for now  RENAL- creatinine normal  GI- diarrhea  resolved  Pain control- good  SCD + enoxaparin for DVT prophylaxis    LOS: 15 days    [name] 04/07/2018

## 2018-04-08 ENCOUNTER — Inpatient Hospital Stay (HOSPITAL_COMMUNITY): Payer: Medicare Other

## 2018-04-08 DIAGNOSIS — J189 Pneumonia, unspecified organism: Secondary | ICD-10-CM

## 2018-04-08 DIAGNOSIS — E440 Moderate protein-calorie malnutrition: Secondary | ICD-10-CM

## 2018-04-08 DIAGNOSIS — A419 Sepsis, unspecified organism: Principal | ICD-10-CM

## 2018-04-08 MED ORDER — LOPERAMIDE HCL 2 MG PO CAPS: 2.0000 mg | ORAL_CAPSULE | ORAL | 0 refills | Status: DC | PRN

## 2018-04-08 MED ORDER — SACCHAROMYCES BOULARDII 250 MG PO CAPS: 250.0000 mg | ORAL_CAPSULE | Freq: Two times a day (BID) | ORAL | 0 refills | Status: AC

## 2018-04-08 MED ORDER — AMOXICILLIN-POT CLAVULANATE 500-125 MG PO TABS: 1.0000 | ORAL_TABLET | Freq: Three times a day (TID) | ORAL | 0 refills | Status: DC

## 2018-04-08 NOTE — Progress Notes (Signed)
Discharge instructions and follow-up instructions reviewed with patient, IV removed and personal belongings packed. Patient wheeled to entrance with oxygen, transitioned to home O2 in car.

## 2018-04-08 NOTE — Progress Notes (Signed)
Assisted patient to sister's vehicle, where we attempted to switch him to home O2 canister. Lock missing from top of canister, RN used hemostat to turn valve and open O2 flow. Tank read FULL and was dialed to correct settings, yet RN and nurse tech did not hear O2 flowing from cannula. Pt and sister were advised that pt sister should return home and replace canister so that RN could ensure pt was getting O2. Pt refused to have sister return home after multiple requests, said he did not want to wait for her to bring a new tank. Patient advised to switch to another tank and call [facility] agency when home.

## 2018-04-08 NOTE — Progress Notes (Addendum)
[address]       [place] [address]             [phone]      11 Days Post-Op Procedure(s) (LRB): VIDEO ASSISTED THORACOSCOPY (VATS)/EMPYEMA (Right) DECORTICATION (Right) Subjective: Feels well  Objective: Vital signs in last 24 hours: Temp:  [98.1 F (36.7 C)-98.8 F (37.1 C)] 98.1 F (36.7 C) (05/04 0700) Pulse Rate:  [101-115] 104 (05/04 0700) Cardiac Rhythm: Sinus tachycardia (05/04 0707) Resp:  [16-25] 21 (05/04 0700) BP: (107-134)/(75-87) 126/80 (05/04 0700) SpO2:  [92 %-97 %] 94 % (05/04 0700)  Hemodynamic parameters for last 24 hours:    Intake/Output from previous day: 05/03 0701 - 05/04 0700 In: 1120.4 [P.O.:760; I.V.:360.4] Out: 2050 [Urine:2000; Chest Tube:50] Intake/Output this shift: Total I/O In: 300 [P.O.:300] Out: -   General appearance: alert, cooperative and no distress Heart: regular rate and rhythm and tachy Lungs: tubular and diminishedin right base Abdomen: benign Extremities: no edema or calf tenderness Wound: incis mild erethma without drainage  Lab Results: Recent Labs    04/06/18 0254 04/07/18 0530  WBC 9.3 8.5  HGB 9.2* 9.2*  HCT 29.6* 30.4*  PLT 215 225   BMET:  Recent Labs    04/06/18 0254 04/07/18 0530  NA 141 143  K 3.8 3.7  CL 105 108  CO2 28 30  GLUCOSE 89 81  BUN 12 9  CREATININE 1.30* 1.22  CALCIUM 8.1* 8.1*    PT/INR: No results for input(s): LABPROT, INR in the last 72 hours. ABG    Component Value Date/Time   PHART 7.460 (H) 03/29/2018 0308   HCO3 34.1 (H) 03/29/2018 0308   TCO2 36 (H) 03/29/2018 0308   ACIDBASEDEF 4.0 (H) 07/19/2016 1407   O2SAT 97.0 03/29/2018 0308   CBG (last 3)  No results for input(s): GLUCAP in the last 72 hours.  Meds Scheduled Meds: . amoxicillin-clavulanate  1 tablet Oral Q8H  . apixaban  5 mg Oral BID  . escitalopram  20 mg Oral Daily  . gabapentin  600 mg Oral BID  . guaiFENesin  1,200 mg Oral BID  . lactose free nutrition  237 mL Oral  BID BM  . methadone  20 mg Oral 5 X Daily  . mirtazapine  15 mg Oral QHS  . polyvinyl alcohol  2 drop Both Eyes Daily  . saccharomyces boulardii  250 mg Oral BID  . sodium chloride flush  10-40 mL Intracatheter Q12H  . varenicline  0.5 mg Oral BID   Continuous Infusions: . sodium chloride 10 mL/hr at 04/04/18 0300  . potassium chloride Stopped (03/31/18 0914)   PRN Meds:.ALPRAZolam, ipratropium-albuterol, loperamide, oxyCODONE, potassium chloride, sodium chloride, sodium chloride flush, tiZANidine, traMADol  Xrays Dg Chest 2 View  Result Date: 04/08/2018 CLINICAL DATA:  Empyema. EXAM: CHEST - 2 VIEW COMPARISON:  Chest x-ray from yesterday. FINDINGS: Unchanged right chest wall port catheter with tip in the proximal right atrium. Stable cardiomediastinal silhouette. Unchanged right hemithorax volume loss with loculated pleural effusion and diffuse airspace disease, greater in the lower lobe. The left lung is clear. No pneumothorax. Subcutaneous emphysema in the right chest wall is unchanged. IMPRESSION: 1. Unchanged loculated right pleural effusion with volume loss and airspace disease in the right lung. Electronically Signed   By: Titus Dubin M.D.   On: 04/08/2018 08:41   Dg Chest 1v Repeat Same Day  Result Date: 04/07/2018 CLINICAL DATA:  Chest tube removed. EXAM: CHEST - 1 VIEW  SAME DAY COMPARISON:  Earlier today. FINDINGS: The right chest tube has been removed. No pneumothorax. Stable right subcutaneous emphysema and right hemithorax volume loss with pleural fluid and airspace opacity. Stable mild patchy opacity at the left lung base with stable prominence of the interstitial markings. Thoracic spine degenerative changes. IMPRESSION: 1. No pneumothorax following right chest tube removal. 2. Stable mild left basilar pneumonia or patchy atelectasis. 3. Stable right pleural thickening, pleural fluid and airspace opacity. Electronically Signed   By: Claudie Revering M.D.   On: 04/07/2018 12:02    Dg Chest Port 1 View  Result Date: 04/07/2018 CLINICAL DATA:  Chest tube, empyema, shortness of Breath EXAM: PORTABLE CHEST 1 VIEW COMPARISON:  04/06/2018 FINDINGS: Right Port-A-Cath and chest tube remain in place, unchanged. Pleural thickening and effusion on the right with diffuse right lung airspace disease and volume loss again noted, unchanged. Stable left base atelectasis or infiltrate. Cardiomegaly. Stable right chest wall subcutaneous emphysema. IMPRESSION: No significant change since prior study. Electronically Signed   By: Rolm Baptise M.D.   On: 04/07/2018 09:19    Assessment/Plan: S/P Procedure(s) (LRB): VIDEO ASSISTED THORACOSCOPY (VATS)/EMPYEMA (Right) DECORTICATION (Right)   1 doing well from surgical perspective, min erethema of incis, on augmentin 2 no diarrhea 3 no new labs 4 home when mediacally stable per primary  LOS: 16 days    John Giovanni 04/08/2018 Patient seen and examined, agree with above Wound is intact but I do not think staples are ready to come out yet- will remove in office OK for dc from our standpoint  Remo Lipps C. Roxan Hockey, MD [facility] [phone]

## 2018-04-08 NOTE — Care Management (Signed)
CHest tube was removed and patient will f/u with CT surgery for incision assessment and staple removal.  No HH necessary at this time.

## 2018-04-08 NOTE — Discharge Summary (Signed)
Physician Discharge Summary  Erik [name] SWF:[id number] DOB: 09/28/1961 DOA: 03/23/2018  PCP: [name], MD  Admit date: 03/23/2018 Discharge date: 04/08/2018  Admitted From: Home Disposition: Home   Recommendations for Outpatient Follow-up:  1. Follow up with CT surgery for staple removal/wound reevaluation.  2. Follow up with PCP in 1-2 weeks with repeat BMP and CBC. Monitor BP which was normotensive despite holding home antihypertensives and will be held at discharge.   Home Health: None Equipment/Devices: Continue 2L O2 Discharge Condition: Stable CODE STATUS: Full Diet recommendation: Heart healthy  Brief/Interim Summary: Erik [name] is a 57 year old male with a history of COPD on 2 L oxygen, lung cancer, presented to [facility] with shortness of breath and fevers. 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 He was transferred to [facility] for further treatments. On intravenous antibiotics. Cardiothoracic surgeryteam proceeded with video-assisted thoracoscopy (VATS) with drainage of empyema and decortication on 4/23.Patientis chronically on anticoagulation for pulmonary embolus diagnosed in 12/2017. On IV heparin during procedure and eliquis restarted on 4/26. Post op course complicated by noninfectious diarrhea which resolved prior to discharge. Was associated with AKI which are improved. Final chest tube removed 5/3.   Discharge Diagnoses:  Principal Problem:   Empyema of lung ([place]) Active Problems:   Depression with anxiety   Adenocarcinoma of right lung (HCC)   COPD (chronic obstructive pulmonary disease) ([place])   Essential hypertension   Acute on chronic respiratory failure with hypoxia (HCC)   Sepsis due to pneumonia (HCC)   History of pulmonary embolism   Empyema lung (HCC)   Malnutrition of moderate degree  Sepsis secondary to empyema in the setting of chronic effusion with prior radiation: Sepsis resolved.   - S/p decortication with chest tube placement. CT surgery managed chest tubes, with final removal 5/3. They will follow up in office for ongoing wound care at follow up. - Continue augmentin through 5/15 to complete total of 4 weeks of therapy (had vanc/zosyn 4/18-4/29 initially). Cx no growth.  - Mobilize, IS. Monitoring CXR.  AKI: Due to GI losses. Baseline Cr ~0.5, now up to 1.55 - 1.36. And becoming tachycardic. - Improved with IV fluids. Plan to monitor at follow up, push po intake. - Avoid nephrotoxins as able.   Diarrhea: Noninfectious. CDiff negative x2. Possibly antibiotic-related. ?Due to methadone withdrawal. Now resolved. - Ok to continue prn imodium.  - Probiotic started and will be continued given that he will continue augmentin for weeks.  HTN:  - Has been at goal despite stopping home antihypertensives. These will be held at discharge and will need to be considered at PCP follow up. Pt has BP cuff at home and will check BP daily, recommended to restart norvasc, HCTZ if BP becomes elevated.   Chronic low back pain:  - Methadone restarted  Tobacco use disorder - Continue chantix, no significant side effects - Brief cessation counseling provided  Anemia of chronic disease: Initially trended downward, now stable, likely blood loss from chest tube.  - Check CBC at follow up  History of PE:  - Restarted eliquis on 4/26  Discharge Instructions Discharge Instructions    Diet - low sodium heart healthy   Complete by:  As directed    Discharge instructions   Complete by:  As directed    Follow up with the surgeon as scheduled and call to schedule a PCP follow up appointment in the next 1 -2 weeks for repeat blood work and vital signs.  -  Your blood pressure has been at goal despite holding your blood pressure medications, so you should hold these until you follow up with your PCP. Check your blood pressure intermittently and if it is >140/90, restart norvasc and  HCTZ.  - Continue taking augmentin through 5/15. To avoid antibiotic-associated diarrhea, you should take florastor as well for a longer course.   Increase activity slowly   Complete by:  As directed      Allergies as of 04/08/2018      Reactions   Demeclocycline Other (See Comments), Swelling   Tetracyclines & Related Swelling, Other (See Comments)         Medication List    STOP taking these medications   amLODipine 5 MG tablet Commonly known as:  NORVASC   furosemide 20 MG tablet Commonly known as:  LASIX   hydrochlorothiazide 12.5 MG capsule Commonly known as:  MICROZIDE     TAKE these medications   albuterol (2.5 MG/3ML) 0.083% nebulizer solution Commonly known as:  PROVENTIL Take 3 mLs (2.5 mg total) by nebulization every 6 (six) hours as needed for wheezing or shortness of breath (if unable to experience relief by using combivent inhaler).   ALPRAZolam 1 MG tablet Commonly known as:  XANAX Take 1 mg by mouth 2 (two) times daily as needed for anxiety or sleep (takes 1 tablet and bedtime for sleep and 1 dose during the day only if needed).   amoxicillin-clavulanate 500-125 MG tablet Commonly known as:  AUGMENTIN Take 1 tablet (500 mg total) by mouth every 8 (eight) hours for 11 days.   apixaban 5 MG Tabs tablet Commonly known as:  ELIQUIS Take 1 tablet (5 mg total) by mouth 2 (two) times daily.   escitalopram 20 MG tablet Commonly known as:  LEXAPRO Take 20 mg by mouth daily.   gabapentin 600 MG tablet Commonly known as:  NEURONTIN Take 600 mg by mouth 2 (two) times daily.   guaiFENesin 600 MG 12 hr tablet Commonly known as:  MUCINEX Take 600 mg by mouth 2 (two) times daily as needed (for congestion.).   Ipratropium-Albuterol 20-100 MCG/ACT Aers respimat Commonly known as:  COMBIVENT Inhale 1 puff into the lungs every 6 (six) hours as needed for wheezing or shortness of breath.   lactulose 10 GM/15ML solution Commonly known as:  CHRONULAC Take 15 mLs  (10 g total) by mouth 3 (three) times daily as needed for mild constipation or moderate constipation.   loperamide 2 MG capsule Commonly known as:  IMODIUM Take 1 capsule (2 mg total) by mouth as needed for diarrhea or loose stools.   methadone 10 MG tablet Commonly known as:  DOLOPHINE Take 20 mg by mouth 5 (five) times daily. Takes 2 tablets 5 times daily   mirtazapine 15 MG tablet Commonly known as:  REMERON Take 1 tablet by mouth at bedtime.   multivitamin with minerals Tabs tablet Take 1 tablet by mouth daily. Centrum   ondansetron 8 MG tablet Commonly known as:  ZOFRAN TAKE 1 TABLET BY MOUTH EVERY 8 HOURS AS NEEDED FOR NAUSEA AND VOMITING.   pantoprazole 40 MG tablet Commonly known as:  PROTONIX TAKE (1) TABLET BY MOUTH TWICE DAILY.   potassium chloride SA 20 MEQ tablet Commonly known as:  K-DUR,KLOR-CON Take 1 tablet (20 mEq total) by mouth daily.   saccharomyces boulardii 250 MG capsule Commonly known as:  FLORASTOR Take 1 capsule (250 mg total) by mouth 2 (two) times daily for 24 days.   SOOTHE XP  Soln Apply 2 drops to eye daily.   TECENTRIQ IV Inject into the vein. Every 3 weeks   tiZANidine 4 MG tablet Commonly known as:  ZANAFLEX Take 4 mg by mouth every 8 (eight) hours as needed for muscle spasms.   varenicline 0.5 MG tablet Commonly known as:  CHANTIX TAKE (2) TABLETS BY MOUTH TWICE DAILY.      Follow-up Information    [name], MD Follow up on 04/18/2018.   Specialty:  Cardiothoracic Surgery Why:  Appointment is at 4:45, please get CXR at 4:15 at [facility] imaging located on first floor of our office building Contact information: [place] [address] [phone]        [name], MD. Call in 1 day(s).   Specialty:  Internal Medicine Contact information: [address] [place] [address] [phone]          Allergies  Allergen Reactions  . Demeclocycline Other (See  Comments) and Swelling  . Tetracyclines & Related Swelling and Other (See Comments)         Consultations:  CT Surgery  Procedures/Studies: Dg Chest 2 View  Result Date: 04/08/2018 CLINICAL DATA:  Empyema. EXAM: CHEST - 2 VIEW COMPARISON:  Chest x-ray from yesterday. FINDINGS: Unchanged right chest wall port catheter with tip in the proximal right atrium. Stable cardiomediastinal silhouette. Unchanged right hemithorax volume loss with loculated pleural effusion and diffuse airspace disease, greater in the lower lobe. The left lung is clear. No pneumothorax. Subcutaneous emphysema in the right chest wall is unchanged. IMPRESSION: 1. Unchanged loculated right pleural effusion with volume loss and airspace disease in the right lung. Electronically Signed   By: Titus Dubin M.D.   On: 04/08/2018 08:41   Dg Chest 2 View  Result Date: 03/23/2018 CLINICAL DATA:  RIGHT chest pain and shortness of breath. History of lung cancer. EXAM: CHEST - 2 VIEW COMPARISON:  03/06/2018 chest radiograph, 01/28/2018 CT and prior studies FINDINGS: RIGHT hemithorax volume loss again noted. Apparent air-fluid level overlying the LOWER RIGHT hemithorax is best identified on the LATERAL view and may represent a loculated pleural or pulmonary collection. There is apparent increase in airspace opacities within the visualized UPPER RIGHT lung. Mild LEFT basilar opacities noted. There is no evidence of pneumothorax. RIGHT Port-A-Cath is again noted with tip overlying the SUPERIOR cavoatrial junction. IMPRESSION: Apparent air-fluid level overlying the LOWER RIGHT hemithorax and may represent a loculated pleural or pulmonary collection. Apparent increase and UPPER RIGHT lung airspace opacities and new mild LEFT basilar opacities noted. Chest CT with contrast is recommended for further evaluation. Electronically Signed   By: Margarette Canada M.D.   On: 03/23/2018 20:03   Ct Chest W Contrast  Result Date: 03/23/2018 CLINICAL DATA:   Acute onset of shortness of breath and right-sided chest pain. Dyspnea. EXAM: CT CHEST WITH CONTRAST TECHNIQUE: Multidetector CT imaging of the chest was performed during intravenous contrast administration. CONTRAST:  75 mL ISOVUE-300 IOPAMIDOL (ISOVUE-300) INJECTION 61% COMPARISON:  Chest radiograph performed earlier today at 7:38 p.m., and CTA of the chest performed 01/28/2018 FINDINGS: Cardiovascular: The heart is normal in size. A right-sided chest port is noted ending about the proximal right atrium. Minimal calcification is noted along the aortic arch. The great vessels are grossly unremarkable in appearance. Mediastinum/Nodes: Prominent mediastinal nodes are seen, measuring up to 1.8 cm in size at the subcarinal region. A mildly enlarged 1.2 cm node is also noted adjacent to the distal esophagus.  Diffuse inflammation tracks about the esophagus and distal trachea, likely reflecting extension of the patient's severe lung infection. A small pericardial effusion is noted. The thyroid gland is unremarkable. No axillary lymphadenopathy is seen. Lungs/Pleura: There is a new large complex collection of air and fluid at the right pleural space, with underlying pleural thickening. This is concerning for new empyema secondary to the patient's necrotizing pneumonia. Given the amount of air within the collection, a bronchopleural fistula is a concern. There is underlying diffuse consolidation of much of the right lung, worsened from the prior study. Cavitary components are again noted. Additional pneumonia is noted at the left lung apex and left lung base, new from the prior study. There appears to be opacification of the bronchus to the right lower lobe; underlying aspiration cannot be excluded. No pneumothorax is seen. Upper Abdomen: The visualized portions of the liver are unremarkable. The spleen is enlarged, measuring 14.6 cm in length. The visualized portions of the pancreas and adrenal glands are unremarkable.  Musculoskeletal: No acute osseous abnormalities are identified. The visualized musculature is unremarkable in appearance. IMPRESSION: 1. New large empyema noted at the right pleural space, with a complex collection of air and fluid and underlying pleural thickening. Given the amount of air within the collection, a bronchopleural fistula is a concern. 2. Worsening diffuse right-sided necrotizing pneumonia, and new left apical and left basilar pneumonia. 3. Mediastinal lymphadenopathy likely reflects the acute infection. Diffuse inflammation tracks about the esophagus and distal trachea, likely reflecting extension of the severe lung infection. 4. Opacification of the bronchus to the right lower lung lobe. Underlying aspiration cannot be excluded. 5. Small pericardial effusion. 6. Splenomegaly. Electronically Signed   By: Garald Balding M.D.   On: 03/23/2018 22:12   Mr Jeri Cos WE Contrast  Result Date: 03/20/2018 CLINICAL DATA:  Follow-up lung cancer. EXAM: MRI HEAD WITHOUT AND WITH CONTRAST TECHNIQUE: Multiplanar, multiecho pulse sequences of the brain and surrounding structures were obtained without and with intravenous contrast. CONTRAST:  63mL MULTIHANCE GADOBENATE DIMEGLUMINE 529 MG/ML IV SOLN COMPARISON:  07/22/2017. FINDINGS: The patient was unable to remain motionless for the exam. Small or subtle lesions could be overlooked. Brain: No evidence for acute infarction, hemorrhage, mass lesion, hydrocephalus, or extra-axial fluid. Generalized atrophy. Mild subcortical and periventricular T2 and FLAIR hyperintensities, likely chronic microvascular ischemic change. Post infusion, no abnormal enhancement of the brain or meninges. Vascular: Flow voids are maintained throughout the carotid, basilar, and vertebral arteries. There are no areas of chronic hemorrhage. Skull and upper cervical spine: Unremarkable visualized calvarium, skullbase, and cervical vertebrae. Pituitary, pineal, cerebellar tonsils unremarkable.  No upper cervical cord lesions. Sinuses/Orbits: No orbital masses or proptosis. Globes appear symmetric. Sinuses appear well aerated, without evidence for air-fluid level. Other: None. IMPRESSION: Motion degraded examination demonstrating atrophy and small vessel disease, similar to priors. No evidence for intracranial metastatic disease or acute intracranial abnormality. Electronically Signed   By: Staci Righter M.D.   On: 03/20/2018 14:48   Dg Chest 1v Repeat Same Day  Result Date: 04/07/2018 CLINICAL DATA:  Chest tube removed. EXAM: CHEST - 1 VIEW SAME DAY COMPARISON:  Earlier today. FINDINGS: The right chest tube has been removed. No pneumothorax. Stable right subcutaneous emphysema and right hemithorax volume loss with pleural fluid and airspace opacity. Stable mild patchy opacity at the left lung base with stable prominence of the interstitial markings. Thoracic spine degenerative changes. IMPRESSION: 1. No pneumothorax following right chest tube removal. 2. Stable mild left basilar pneumonia or patchy  atelectasis. 3. Stable right pleural thickening, pleural fluid and airspace opacity. Electronically Signed   By: Claudie Revering M.D.   On: 04/07/2018 12:02   Dg Chest Port 1 View  Result Date: 04/07/2018 CLINICAL DATA:  Chest tube, empyema, shortness of Breath EXAM: PORTABLE CHEST 1 VIEW COMPARISON:  04/06/2018 FINDINGS: Right Port-A-Cath and chest tube remain in place, unchanged. Pleural thickening and effusion on the right with diffuse right lung airspace disease and volume loss again noted, unchanged. Stable left base atelectasis or infiltrate. Cardiomegaly. Stable right chest wall subcutaneous emphysema. IMPRESSION: No significant change since prior study. Electronically Signed   By: Rolm Baptise M.D.   On: 04/07/2018 09:19   Dg Chest Port 1 View  Result Date: 04/06/2018 CLINICAL DATA:  Short of breath, history of empyema EXAM: PORTABLE CHEST 1 VIEW COMPARISON:  Portable chest x-ray of 04/05/2018 and  CT chest of 03/23/2017 FINDINGS: There is little change in considerable volume loss on the right with opacity throughout the right hemithorax consistent with effusion, possible empyema as noted previously as well as atelectasis. A right chest tube remains. The left lung is clear, with only minimal markings at the left lung base possibly due to atelectasis. Cardiomegaly is stable. Right chest wall subcutaneous air is again noted. Right central venous line tip overlies expected SVC-RA junction. IMPRESSION: 1. Little change in considerable volume loss of the right lung with opacity throughout the right hemithorax consistent with right pleural effusion, perhaps empyema, and pneumonia cannot be excluded. Right chest tube remains. 2. Minimally prominent markings at the left lung base most likely due atelectasis. 3. Right chest tube remains. Electronically Signed   By: Ivar Drape M.D.   On: 04/06/2018 09:24   Dg Chest Port 1 View  Result Date: 04/05/2018 CLINICAL DATA:  Chest tube, shortness of breath EXAM: PORTABLE CHEST 1 VIEW COMPARISON:  04/04/2018 FINDINGS: Right basilar chest tube remains in place with extensive subcutaneous emphysema. Persistent right volume loss in diffuse airspace disease throughout the right lung with pleural effusion and/or pleural thickening. No visible pneumothorax. Cardiomegaly. Left base atelectasis or early infiltrate, stable. IMPRESSION: Stable appearance with volume loss on the right. Pleural effusion or pleural thickening, and diffuse airspace disease throughout the aerated right lung. Extensive subcutaneous emphysema on the right is stable. No visible pneumothorax. Left base atelectasis or early infiltrate, stable. Stable cardiomegaly. Electronically Signed   By: Rolm Baptise M.D.   On: 04/05/2018 10:28   Dg Chest Port 1 View  Result Date: 04/04/2018 CLINICAL DATA:  Right-sided empyema with chest tube drainage. EXAM: PORTABLE CHEST 1 VIEW COMPARISON:  Portable chest x-ray of  April 03, 2018 FINDINGS: There is chronic shift of the mediastinum toward the right. Only a small amount of aerated lung is visible on the right. The there appears to have been removal of the medial right chest tube. The lateral chest tube is in stable position over the lower hemithorax. There is no pneumothorax. There is considerable pleural thickening. The left lung is well-expanded. There is patchy density at the left lung base worrisome for atelectasis. The left heart border is normal. The pulmonary vascularity is not engorged. There is considerable subcutaneous emphysema on the right. The power port catheter tip projects over the distal third of the SVC. The left internal jugular venous catheter tip projects at the junction of the right and left brachiocephalic veins. IMPRESSION: Persistent right-sided volume loss with parenchymal consolidation and pleural thickening. The amount of aerated right lung appears slightly decreased today. Increased  density at the left lung base worrisome for atelectasis or developing pneumonia. The support tubes are in reasonable position. Large amount of subcutaneous emphysema in the right axillary region and base of the right neck. Electronically Signed   By: David  Martinique M.D.   On: 04/04/2018 09:38   Dg Chest Port 1 View  Result Date: 04/03/2018 CLINICAL DATA:  Shortness of breath, chest tube EXAM: PORTABLE CHEST 1 VIEW COMPARISON:  04/02/2018 FINDINGS: Extensive subcutaneous emphysema again noted throughout the right chest wall. Right chest tubes, left central line and right Port-A-Cath remain in place, unchanged. Diffuse lung volumes and airspace disease throughout the right lung. Continued right hydropneumothorax, stable. No confluent opacity on the left. Mild cardiomegaly. IMPRESSION: No significant change in the right hydropneumothorax and extensive right subcutaneous emphysema. Right chest tubes are in stable position. Volume loss with diffuse airspace disease  throughout the right lung, stable. Electronically Signed   By: Rolm Baptise M.D.   On: 04/03/2018 07:57   Dg Chest Port 1 View  Result Date: 04/02/2018 CLINICAL DATA:  Status post VATS for empyema.  Chest tube in place. EXAM: PORTABLE CHEST 1 VIEW COMPARISON:  04/01/2018 FINDINGS: Right chest tubes remain in place. Diffuse right lung volume loss again seen. Small right hydropneumothorax remains stable. Extensive subcutaneous emphysema again seen throughout the right chest wall. Compensatory hyperinflation of left lung is again seen, which remains clear. Bilateral jugular central venous catheters remain in appropriate position. Heart size is within normal limits. IMPRESSION: Stable diffuse right lung volume loss with small hydropneumothorax. No acute findings. Electronically Signed   By: Earle Gell M.D.   On: 04/02/2018 10:09   Dg Chest Port 1 View  Result Date: 04/01/2018 CLINICAL DATA:  Empyema EXAM: PORTABLE CHEST 1 VIEW COMPARISON:  03/31/2018 FINDINGS: Left jugular central venous catheter and right jugular Port-A-Cath are stable. Right chest tubes are stable. Right loculated pleural effusion is stable. Heterogeneous opacities in the right lung are stable. Extensive emphysema over the right chest wall and right neck is stable. Left lung is clear. IMPRESSION: Stable right chest tubes and right empyema. Electronically Signed   By: Marybelle Killings M.D.   On: 04/01/2018 12:37   Dg Chest Port 1 View  Result Date: 03/31/2018 CLINICAL DATA:  Empyema, chest tube EXAM: PORTABLE CHEST 1 VIEW COMPARISON:  03/30/2018 FINDINGS: Right chest tube remains in place with small right effusion. Right basilar pneumothorax with mild improvement. Continued volume loss and airspace disease throughout the right lung. Subcutaneous emphysema in the right chest wall unchanged. Mild left lower lobe atelectasis.  No effusion on the left. Right jugular Port-A-Cath tip in the right atrium unchanged. Left jugular central venous  catheter tip proximal SVC unchanged. IMPRESSION: Right chest tube remains in place with mild improvement in right basilar pneumothorax. No change in right pleural and parenchymal disease Electronically Signed   By: Franchot Gallo M.D.   On: 03/31/2018 09:06   Dg Chest Port 1 View  Result Date: 03/30/2018 CLINICAL DATA:  Empyema, chest tube EXAM: PORTABLE CHEST 1 VIEW COMPARISON:  03/29/2018 FINDINGS: Left central line, right Port-A-Cath and right chest tubes remain in place, unchanged. There is a moderate right basilar and lateral pneumothorax, slightly increased in size since prior study. Diffuse right lung airspace disease. Increasing subcutaneous emphysema within the right chest wall. Left base opacity, likely atelectasis. Cardiomegaly. IMPRESSION: Increasing size of the right basilar and lateral pneumothorax with right chest tubes in stable position. Increasing subcutaneous emphysema in the right chest wall. Diffuse  right lung airspace disease.  Left base atelectasis. Electronically Signed   By: Rolm Baptise M.D.   On: 03/30/2018 09:58   Dg Chest Port 1 View  Result Date: 03/29/2018 CLINICAL DATA:  Chest tube present.  Lung surgery. EXAM: PORTABLE CHEST 1 VIEW COMPARISON:  Yesterday FINDINGS: Two right base chest tubes are in similar position. Right base pneumothorax is likely stable when allowing for differences in positioning. There may be increased right pleural fluid. Right chest wall emphysema is new. There is pleural based thickening and extensive lung opacification on the right with volume loss. Mild airspace disease in the left lower lobe, stable. Left IJ line with tip at the brachiocephalic SVC confluence. Right porta catheter with tip at the right atrium. IMPRESSION: 1. Chest wall emphysema has developed on the right since intraoperative film yesterday. 2. Loculated appearing right base pneumothorax that is similar to yesterday. 3. No detected change in pleuroparenchymal opacity. Electronically  Signed   By: Monte Fantasia M.D.   On: 03/29/2018 11:06   Dg Chest Portable 1 View  Result Date: 03/28/2018 CLINICAL DATA:  Check for retained instruments or surgical objects EXAM: PORTABLE CHEST 1 VIEW COMPARISON:  Preoperative chest x-ray of March 23, 2017 FINDINGS: Patient has apparently undergone right sided surgery surgery for drainage of a large empyema. There are chest tubes present. A small amount of pleural space air is noted inferiorly. There is shift of the mediastinum toward the right. The left lung is well-expanded. There is infiltrate or atelectasis at the left lung base. No abnormal surgical instruments are retained. A power port is present is tip projects over the cavoatrial junction. There is a left internal jugular venous catheter tip projects over the proximal SVC. IMPRESSION: No retained metallic surgical instruments are observed. There are postsurgical changes in the right hemithorax. The 2 right-sided chest tubes as well as the power port catheter and the left internal jugular venous catheter are in position as described. There is some pleural space air in the inferior aspect of the right hemithorax likely introduced during drainage of the suspected empyema. New increased density at the left lung base which may reflect atelectasis or developing pneumonia. This report was called to OR 17's nurse, Crystal, at 1:03 p.m. on 28 March 2018. Electronically Signed   By: David  Martinique M.D.   On: 03/28/2018 13:06     VATS right-sided decortication from right-sided empyema by Dr. Roxan Hockey 4/23.  Antimicrobials:  Vancomycin, zosyn 4/18 - 4/29  Augmentin 4/30 - 5/15  Subjective: Packing up belongings as I enter the room, feels much better every day. Pain is minimal and dyspnea is back near baseline.   Discharge Exam: Vitals:   04/08/18 0700 04/08/18 1124  BP: 126/80 121/77  Pulse: (!) 104 (!) 113  Resp: (!) 21 (!) 34  Temp: 98.1 F (36.7 C) 98.5 F (36.9 C)  SpO2: 94% 94%    Gen: Chronically ill-appearing pleasant male in no distress.  Pulm: Non-labored. Diminished at right base, no rhonchi today. No crackles or wheezing. CV: Regular borderline tachycardia. No murmur, rub, or gallop. No JVD, trace pedal edema. GI: Abdomen soft, non-tender, non-distended, with normoactive bowel sounds. No organomegaly or masses felt. Ext: Warm, no deformities Skin: Chest tube site with staples, minimal erythema, mild tenderness. No discharge, induration, dehiscence, or fluctuance   Labs: BNP (last 3 results) Recent Labs    03/06/18 1426  BNP 188.4*   Basic Metabolic Panel: Recent Labs  Lab 04/04/18 0403 04/05/18 0530 04/06/18 0254  04/07/18 0530  NA 140 140 141 143  K 4.0 3.6 3.8 3.7  CL 104 105 105 108  CO2 29 29 28 30   GLUCOSE 81 84 89 81  BUN 15 14 12 9   CREATININE 1.55* 1.36* 1.30* 1.22  CALCIUM 8.0* 8.1* 8.1* 8.1*   Liver Function Tests: No results for input(s): AST, ALT, ALKPHOS, BILITOT, PROT, ALBUMIN in the last 168 hours. No results for input(s): LIPASE, AMYLASE in the last 168 hours. No results for input(s): AMMONIA in the last 168 hours. CBC: Recent Labs  Lab 04/05/18 0530 04/06/18 0254 04/07/18 0530  WBC 8.2 9.3 8.5  HGB 9.6* 9.2* 9.2*  HCT 30.9* 29.6* 30.4*  MCV 93.4 93.7 95.0  PLT 239 215 225   Cardiac Enzymes: No results for input(s): CKTOTAL, CKMB, CKMBINDEX, TROPONINI in the last 168 hours. BNP: Invalid input(s): POCBNP CBG: No results for input(s): GLUCAP in the last 168 hours. D-Dimer No results for input(s): DDIMER in the last 72 hours. Hgb A1c No results for input(s): HGBA1C in the last 72 hours. Lipid Profile No results for input(s): CHOL, HDL, LDLCALC, TRIG, CHOLHDL, LDLDIRECT in the last 72 hours. Thyroid function studies No results for input(s): TSH, T4TOTAL, T3FREE, THYROIDAB in the last 72 hours.  Invalid input(s): FREET3 Anemia work up No results for input(s): VITAMINB12, FOLATE, FERRITIN, TIBC, IRON, RETICCTPCT  in the last 72 hours. Urinalysis    Component Value Date/Time   COLORURINE YELLOW 03/27/2018 1952   APPEARANCEUR CLEAR 03/27/2018 1952   LABSPEC 1.015 03/27/2018 [name] 5.0 03/27/2018 1952   GLUCOSEU NEGATIVE 03/27/2018 [place] NEGATIVE 03/27/2018 [place] NEGATIVE 03/27/2018 [place] NEGATIVE 03/27/2018 [place] NEGATIVE 03/27/2018 1952   NITRITE NEGATIVE 03/27/2018 1952   LEUKOCYTESUR NEGATIVE 03/27/2018 1952    Microbiology Recent Results (from the past 240 hour(s))  C difficile quick scan w PCR reflex     Status: None   Collection Time: 04/02/18  5:25 PM  Result Value Ref Range Status   C Diff antigen NEGATIVE NEGATIVE Final   C Diff toxin NEGATIVE NEGATIVE Final   C Diff interpretation No C. difficile detected.  Final    Comment: Performed at [facility], [place] [address], [place], [place] [address]  C difficile quick scan w PCR reflex     Status: None   Collection Time: 04/04/18  9:43 AM  Result Value Ref Range Status   C Diff antigen NEGATIVE NEGATIVE Final   C Diff toxin NEGATIVE NEGATIVE Final   C Diff interpretation No C. difficile detected.  Final    Time coordinating discharge: Approximately 40 minutes  Patrecia Pour, MD  [facility] 04/08/2018, 11:37 AM Pager [phone]

## 2018-04-10 ENCOUNTER — Other Ambulatory Visit (HOSPITAL_COMMUNITY): Payer: Self-pay | Admitting: Adult Health

## 2018-04-17 ENCOUNTER — Inpatient Hospital Stay (HOSPITAL_COMMUNITY): Payer: Medicare Other | Attending: Internal Medicine

## 2018-04-17 ENCOUNTER — Encounter (HOSPITAL_COMMUNITY): Payer: Self-pay | Admitting: Internal Medicine

## 2018-04-17 ENCOUNTER — Inpatient Hospital Stay (HOSPITAL_COMMUNITY): Payer: Medicare Other | Attending: Hematology and Oncology | Admitting: Internal Medicine

## 2018-04-17 VITALS — BP 157/86 | HR 120 | Temp 97.8°F | Resp 20

## 2018-04-17 DIAGNOSIS — Z7901 Long term (current) use of anticoagulants: Secondary | ICD-10-CM | POA: Insufficient documentation

## 2018-04-17 DIAGNOSIS — Z79899 Other long term (current) drug therapy: Secondary | ICD-10-CM | POA: Insufficient documentation

## 2018-04-17 DIAGNOSIS — Z923 Personal history of irradiation: Secondary | ICD-10-CM | POA: Insufficient documentation

## 2018-04-17 DIAGNOSIS — Z86711 Personal history of pulmonary embolism: Secondary | ICD-10-CM | POA: Diagnosis not present

## 2018-04-17 DIAGNOSIS — I10 Essential (primary) hypertension: Secondary | ICD-10-CM | POA: Insufficient documentation

## 2018-04-17 DIAGNOSIS — K7460 Unspecified cirrhosis of liver: Secondary | ICD-10-CM | POA: Diagnosis not present

## 2018-04-17 DIAGNOSIS — C3431 Malignant neoplasm of lower lobe, right bronchus or lung: Secondary | ICD-10-CM | POA: Insufficient documentation

## 2018-04-17 DIAGNOSIS — F17210 Nicotine dependence, cigarettes, uncomplicated: Secondary | ICD-10-CM | POA: Diagnosis not present

## 2018-04-17 DIAGNOSIS — I313 Pericardial effusion (noninflammatory): Secondary | ICD-10-CM | POA: Insufficient documentation

## 2018-04-17 DIAGNOSIS — K219 Gastro-esophageal reflux disease without esophagitis: Secondary | ICD-10-CM | POA: Insufficient documentation

## 2018-04-17 DIAGNOSIS — J90 Pleural effusion, not elsewhere classified: Secondary | ICD-10-CM | POA: Insufficient documentation

## 2018-04-17 DIAGNOSIS — M7989 Other specified soft tissue disorders: Secondary | ICD-10-CM | POA: Insufficient documentation

## 2018-04-17 DIAGNOSIS — J189 Pneumonia, unspecified organism: Secondary | ICD-10-CM | POA: Insufficient documentation

## 2018-04-17 DIAGNOSIS — M48061 Spinal stenosis, lumbar region without neurogenic claudication: Secondary | ICD-10-CM | POA: Insufficient documentation

## 2018-04-17 DIAGNOSIS — R161 Splenomegaly, not elsewhere classified: Secondary | ICD-10-CM | POA: Diagnosis not present

## 2018-04-17 DIAGNOSIS — J869 Pyothorax without fistula: Secondary | ICD-10-CM | POA: Diagnosis not present

## 2018-04-17 DIAGNOSIS — F329 Major depressive disorder, single episode, unspecified: Secondary | ICD-10-CM | POA: Insufficient documentation

## 2018-04-17 DIAGNOSIS — Z9221 Personal history of antineoplastic chemotherapy: Secondary | ICD-10-CM | POA: Insufficient documentation

## 2018-04-17 DIAGNOSIS — C3491 Malignant neoplasm of unspecified part of right bronchus or lung: Secondary | ICD-10-CM

## 2018-04-17 LAB — CBC WITH DIFFERENTIAL/PLATELET
BASOS PCT: 0 %
Basophils Absolute: 0 10*3/uL (ref 0.0–0.1)
EOS ABS: 0.5 10*3/uL (ref 0.0–0.7)
EOS PCT: 5 %
HCT: 33.2 % — ABNORMAL LOW (ref 39.0–52.0)
HEMOGLOBIN: 10.3 g/dL — AB (ref 13.0–17.0)
Lymphocytes Relative: 13 %
Lymphs Abs: 1.2 10*3/uL (ref 0.7–4.0)
MCH: 28.5 pg (ref 26.0–34.0)
MCHC: 31 g/dL (ref 30.0–36.0)
MCV: 92 fL (ref 78.0–100.0)
Monocytes Absolute: 0.8 10*3/uL (ref 0.1–1.0)
Monocytes Relative: 9 %
NEUTROS PCT: 73 %
Neutro Abs: 6.7 10*3/uL (ref 1.7–7.7)
PLATELETS: 306 10*3/uL (ref 150–400)
RBC: 3.61 MIL/uL — AB (ref 4.22–5.81)
RDW: 15.7 % — ABNORMAL HIGH (ref 11.5–15.5)
WBC: 9.2 10*3/uL (ref 4.0–10.5)

## 2018-04-17 LAB — COMPREHENSIVE METABOLIC PANEL
ALBUMIN: 2 g/dL — AB (ref 3.5–5.0)
ALK PHOS: 267 U/L — AB (ref 38–126)
ALT: 36 U/L (ref 17–63)
AST: 70 U/L — ABNORMAL HIGH (ref 15–41)
Anion gap: 8 (ref 5–15)
BUN: 15 mg/dL (ref 6–20)
CALCIUM: 8.3 mg/dL — AB (ref 8.9–10.3)
CHLORIDE: 93 mmol/L — AB (ref 101–111)
CO2: 35 mmol/L — AB (ref 22–32)
Creatinine, Ser: 1.47 mg/dL — ABNORMAL HIGH (ref 0.61–1.24)
GFR calc Af Amer: >60 mL/min (ref >60)
GFR calc non Af Amer: 52 mL/min — ABNORMAL LOW (ref >60)
GLUCOSE: 119 mg/dL — AB (ref 65–99)
Potassium: 3.3 mmol/L — ABNORMAL LOW (ref 3.5–5.1)
SODIUM: 136 mmol/L (ref 135–145)
Total Bilirubin: 0.4 mg/dL (ref 0.3–1.2)
Total Protein: 7.9 g/dL (ref 6.5–8.1)

## 2018-04-17 LAB — LACTATE DEHYDROGENASE: LDH: 206 U/L — ABNORMAL HIGH (ref 98–192)

## 2018-04-17 NOTE — Patient Instructions (Signed)
[place] [facility]  Discharge Instructions:  You were seen by dr. Martinton Lions today _______________________________________________________________  Thank you for choosing [place] [facility] to provide your oncology and hematology care.  To afford each patient quality time with our providers, please arrive at least 15 minutes before your scheduled appointment.  You need to re-schedule your appointment if you arrive 10 or more minutes late.  We strive to give you quality time with our providers, and arriving late affects you and other patients whose appointments are after yours.  Also, if you no show three or more times for appointments you may be dismissed from the clinic.  Again, thank you for choosing [facility] hope is that these requests will allow you access to exceptional care and in a timely manner. _______________________________________________________________  If you have questions after your visit, please contact our office at [phone] between the hours of 8:30 a.m. and 5:00 p.m. Voicemails left after 4:30 p.m. will not be returned until the following business day. _______________________________________________________________  For prescription refill requests, have your pharmacy contact our office. _______________________________________________________________  Recommendations made by the consultant and any test results will be sent to your referring physician. _______________________________________________________________

## 2018-04-17 NOTE — Progress Notes (Signed)
Diagnosis Adenocarcinoma of right lung (Erik [name]) - Plan: CBC with Differential/Platelet, Comprehensive metabolic panel, Lactate dehydrogenase, Hepatitis B surface antibody, Hepatitis B surface antigen, Hepatitis B core antibody, total, Hepatitis panel, acute, Hepatitis C RNA quantitative  Staging Cancer Staging Adenocarcinoma of right lung [facility]) Staging form: Lung, AJCC 7th Edition - Clinical stage from 07/23/2016: Stage IIIA (T2b, N2, M0) - Signed by Baird Cancer, PA-C on 07/23/2016 - Clinical stage from 07/28/2017: Stage IV (M1a) - Signed by Holley Bouche, NP on 07/28/2017   Assessment and Plan:  1. Stage IV (Q4X2K2K) adenocarcinoma of right lung, initially diagnosed with Stage IIIA (T3N2M0) disease on RML biopsy by Dr. Roxan Hockey on 07/21/2016.  He was previously followed by Dr. Talbert Cage and Dr. Lebron Conners.  He is S/P concomitant chemoXRT with Cisplatin/Etoposide (08/17/2016- 09/20/2016).  He was then transitioned to consolidative immunotherapy with Imfinzi, but then developed progression of disease on imaging in 07/2017.  His treatment was therefore transitioned to Tecentriq beginning on 08/04/2017.  Left supraclavicular biopsy on 08/23/17 demonstrated metastatic adenocarcinoma of lung. Not enough tissue on supraclavicular bx to send for Foundation One, therefore we ordered Liquid Foundation One from peripheral blood which genomid findins of MAP2K1 and TP53 mutation, MSI status undetermined.  Pt had CT Angio of the chest that was done 01/28/2018 showed consolidation but no PE.  CT angio of the chest that was done on 12/26/2017 shows small PE in the left lower lobe with emphysematous changes.  There is also a right pleural effusion.  CT of the abdomen and pelvis done 12/28/2017 shows no evidence of metastatic disease.  There is evidence of liver cirrhosis.    Pleural biopsy done 03/28/2018 showed no malignancy.  He had recent Vats and drainage of empyema in 03/2018.  He will see Dr. Roxan Hockey tomorrow.  Will  await CT surgery follow-up and pt will RTC in 2 weeks.  Will plan on repeat imaging in 05/2018.    2.  Empyema.  Pt was last treated with Tecentirq in 02/2018 due to problems with SOB.  He has been treated with abx.  He was seen by Dr. Delton Coombes 03/14/2018 and he ordered CT of chest which was done on 03/23/2018 and showed   IMPRESSION: 1. New large empyema noted at the right pleural space, with a complex collection of air and fluid and underlying pleural thickening. Given the amount of air within the collection, a bronchopleural fistula is a concern. 2. Worsening diffuse right-sided necrotizing pneumonia, and new left apical and left basilar pneumonia. 3. Mediastinal lymphadenopathy likely reflects the acute infection. Diffuse inflammation tracks about the esophagus and distal trachea, likely reflecting extension of the severe lung infection. 4. Opacification of the bronchus to the right lower lung lobe. Underlying aspiration cannot be excluded. 5. Small pericardial effusion. 6. Splenomegaly  He was admitted on 03/23/2018 and discharged on 04/07/2018 after VATS and drainage of empyema.  Chest tube was removed on 04/07/2018.  He is scheduled to see Dr. Roxan Hockey tomorrow.  Will await CT surgery input due to recent procedures and pt will RTC in 2 weeks for follow-up.  Continue dressing changes as recommended by surgery.    Pt is afebrile.  Labs today show normal WBC count of 9.2.    3.  PE.  He is currently on Eliquis.  He had a CT angio the chest on 01/28/2018 that showed no PE.  Will continue therapy and will set up for repeat CTA in 3-6 months for initial evaluation.   Pulse ox is 93%  on Oxygen.    4.  Emphysema.  This was noted on recent imaging.  He should continue to follow with Dr. Luan Pulling as recommended.  5.  Splenomegaly.  Pt was noted to have evidence of liver cirrhosis on prior imaging done 12/2017.  Plts are 306,000.  Will repeat labs on RTC in 2 weeks.  Will check Hepatitis panel.     Interval History:  57 y.o. presenting to the [facility] for continued clinical monitoring while receiving palliative systemic immunotherapy with Erik [name] which was started in August 2018 for diagnosis of adenocarcinoma of the right lung, stage IV (T3 N2 M1a).    Current Status: Patient is here today for follow-up after hospital discharge.  He is scheduled to see Dr. Roxan Hockey tomorrow.      Adenocarcinoma of right lung ([place])   07/01/2016 PET scan    6.3 x 3.7 cm right lower lobe mass, suspicious for primary bronchogenic neoplasm, as described above. Hypermetabolic thoracic nodal metastases, as above. Additional right perihilar hypermetabolism, indeterminate. Associated right middle lobe atelectasis/collapse.      07/19/2016 Procedure    Bronchoscopy with brushings and biopsies and endobronchial ultrasound with mediastinal lymph node aspirations by Dr. Roxan Hockey      07/21/2016 Pathology Results    Lung, biopsy, Right Middle Lobe - LUNG TISSUE WITH SQUAMOUS METAPLASIA. - NO MALIGNANCY IDENTIFIED.      07/21/2016 Pathology Results    FINE NEEDLE ASPIRATION, ENDOSCOPIC (A) LEVEL 7 (SPECIMEN 1 OF 3, COLLECTED ON 07/19/16): MALIGNANT CELLS CONSISTENT WITH ADENOCARCINOMA.      07/21/2016 Pathology Results    FINE NEEDLE ASPIRATION, EBUS, 4R, B (SPECIMEN 2 OF 3, COLLECTED 07/19/16): MALIGNANT CELLS CONSISTENT WITH ADENOCARCINOMA.      07/21/2016 Pathology Results    FINE NEEDLE ASPIRATION, EBUS, BRUSHING, RIGHT MIDDLE LOBE, D (SPECIMEN 3 OF 3, COLLECTED 07/19/16): MALIGNANT CELLS CONSISTENT WITH ADENOCARCINOMA.      07/22/2016 Imaging    MRI brain- No evidence of intracranial metastases.      08/17/2016 - 09/20/2016 Chemotherapy    The patient had palonosetron (ALOXI) injection 0.25 mg, 0.25 mg, Intravenous,  Once, 1 of 1 cycle  CISplatin (PLATINOL) 123 mg in sodium chloride 0.9 % 500 mL chemo infusion, 50 mg/m2 = 123 mg, Intravenous,  Once, 1 of 1 cycle  etoposide (VEPESID) 120  mg in sodium chloride 0.9 % 500 mL chemo infusion, 50 mg/m2 = 120 mg, Intravenous,  Once, 1 of 1 cycle  fosaprepitant (EMEND) 150 mg, dexamethasone (DECADRON) 12 mg in sodium chloride 0.9 % 145 mL IVPB, , Intravenous,  Once, 1 of 1 cycle  ondansetron (ZOFRAN) 8 mg in sodium chloride 0.9 % 50 mL IVPB, , Intravenous,  Once, 2 of 6 cycles  for chemotherapy treatment.        08/17/2016 -  Radiation Therapy         11/15/2016 Imaging    CT CAP- Interval response to therapy. The previously demonstrated mediastinal and right hilar adenopathy and resulting right middle lobe atelectasis have all improved. 2. No discrete residual lung masses are identified. There is new multifocal ground-glass opacity within the right lower lobe, and to a lesser extent in the left upper lobe. These are probably inflammatory/treatment related. 3. No evidence of abdominopelvic metastatic disease. Stable prominent lymph nodes in the upper abdomen, likely reactive. 4. Decompressed mid SVC without specific signs of SVC occlusion.      12/02/2016 -  Chemotherapy    Imfinzi (durvalumab) immunotherapy every 2 weeks x up to  1 year      01/03/2017 Procedure    EGD by Dr. [name], colonoscopy aborted as "patient forgot to take other half of preparation). Esophagitis. likely radiation-induced ?Dilated.  Erythematous mucosa in the stomach. Biopsied. Normal duodenal bulb and second portion of the duodenum.      02/14/2017 Imaging    CT chest- 1. Development of a small to moderate right-sided pleural effusion with minimal loculation anteriorly. 2. Worsened right-sided aeration with right middle and lower lobe consolidation. As this has a geographic distribution, this could be radiation induced or represent infection. Depending on clinical concern of progressive disease, thoracentesis and/or PET may be informative. 3. Development of mild right paratracheal adenopathy, most likely reactive. Recommend attention on  follow-up. 4. Subtle findings which are highly suspicious for mild cirrhosis. Upper abdominal adenopathy is similar and likely reactive. 5. Persistent right lower and improved left upper lobe ground-glass opacities are favored to be infectious or inflammatory.      06/01/2017 Imaging    CT chest  IMPRESSION: 1. Evolving radiation changes in the medial right hemithorax. 2. Stable moderate size right pleural effusion without definite nodular components. 3. No signs of local recurrence or progressive metastatic disease. No residual enlarged thoracic lymph nodes. 4. Stable appearance of the visualized upper abdomen with probable cirrhosis and reactive adenopathy in the porta hepatis.      07/15/2017 Procedure    Therapeutic thoracentesis performed today; 1.3 L removed.  Fluid sent for cytology.       07/15/2017 Pathology Results    Pleural fluid NEGATIVE for malignancy by cytology.       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 Paramediastinal radiation change and loculated right pleural effusion appear unchanged from 07/14/2017. 2. Borderline prevascular lymph node within the anterior mediastinum is mildly increased in size from previous exam. 3. There is a new 5 mm lung nodule within the left lower lobe. Nonspecific in appearance. Attention on follow-up imaging advise. Other small nonspecific nodules in the left lung are stable. 4. Morphologic features of the liver compatible with cirrhosis. Enlarged upper abdominal lymph nodes are nonspecific and likely reactive. 5. Stable indeterminate low-attenuation focus in the posterior right liver dome. Previously characterized on MRI from 07/29/2017 as reflecting post treatment changes from external beam radiation to the lung. 6.  Emphysema (ICD10-J43.9).        Problem List Patient Active Problem List   Diagnosis Date Noted  .  Malnutrition of moderate degree [E44.0] 03/30/2018  . Sepsis due to pneumonia ([place]) [J18.9, A41.9] 03/23/2018  . Empyema of lung ([place]) [J86.9] 03/23/2018  . History of pulmonary embolism [Z86.711] 03/23/2018  . Empyema lung ([place]) [J86.9] 03/23/2018  . Bronchiectasis with acute exacerbation ([place]) [J47.1]   . Acute on chronic respiratory failure with hypoxia ([facility]) [J96.21]   . Essential hypertension [I10] 03/07/2018  . Steroid-induced hyperglycemia [R73.9, T38.0X5A] 03/07/2018  . COPD (chronic obstructive pulmonary disease) ([place]) [J44.9] 03/06/2018  . HCAP (healthcare-associated pneumonia) [J18.9] 01/28/2018  . Taking multiple medications for chronic disease [R69] 03/08/2017  . 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 Loss of weight [R63.4] 12/10/2016  . Encounter for screening colonoscopy [Z12.11] 12/10/2016  . Esophageal dysphagia [R13.10] 12/10/2016  . Abdominal pain, epigastric [R10.13] 12/10/2016  . Adenocarcinoma of right lung ([place]) [C34.91] 07/23/2016  . GERD [K21.9] 01/13/2009  . ACTINIC KERATOSIS [L57.0] 01/13/2009  . Anxiety state [F41.1] 12/27/2008  .  Depression with anxiety [F41.8] 12/27/2008  . ASTHMA [J45.909] 12/27/2008  . OSTEOARTHRITIS [M19.90] 12/27/2008  . LOW BACK PAIN [M54.5] 12/27/2008    Past Medical History Past Medical History:  Diagnosis Date  . Arnold-Chiari syndrome ([place])   . Arthritis   . Asthma   . Chronic back pain   . COPD (chronic obstructive pulmonary disease) ([place])   . Depression   . Dyspnea    with exertion   . GERD (gastroesophageal reflux disease)   . Hypertension   . Pneumonia   . Pulmonary embolus ([place])   . Seasonal allergies   . Spinal stenosis of lumbar region   . 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 Wheezing     Past Surgical History Past Surgical History:  Procedure Laterality Date  . BACK SURGERY     5 total  . BIOPSY  01/03/2017   Procedure: BIOPSY;  Surgeon: Daneil Dolin, MD;  [facility];  Service: Endoscopy;;  gastric  . COLONOSCOPY WITH PROPOFOL N/A 04/04/2017   Procedure: COLONOSCOPY WITH PROPOFOL;  Surgeon: Daneil Dolin, MD;  [facility];  Service: Endoscopy;  Laterality: N/A;  8:45am  . DECORTICATION Right 03/28/2018   Procedure: DECORTICATION;  Surgeon: [name], MD;  Location[facility] OR;  Service: Thoracic;  Laterality: Right;  . ESOPHAGOGASTRODUODENOSCOPY (EGD) WITH PROPOFOL N/A 01/03/2017   Procedure: ESOPHAGOGASTRODUODENOSCOPY (EGD) WITH PROPOFOL;  Surgeon: Daneil Dolin, MD;  [facility];  Service: Endoscopy;  Laterality: N/A;  . Venia Minks DILATION N/A 01/03/2017   Procedure: Venia Minks DILATION;  Surgeon: Daneil Dolin, MD;  [facility];  Service: Endoscopy;  Laterality: N/A;  . MULTIPLE EXTRACTIONS WITH ALVEOLOPLASTY N/A 07/08/2014   Procedure: MULTIPLE EXTRACION WITH ALVEOLOPLASTY with EXCISION LESION RIGHT SIDE OF TONGUE;  Surgeon: Gae Bon, DDS;  Location: [place];  Service: Oral Surgery;  Laterality: N/A;  . PORTACATH PLACEMENT Right 07/30/2016   Procedure: INSERTION PORT-A-CATH;  Surgeon: Vickie Epley, MD;  Location[facility];  Service: Vascular;  Laterality: Right;  [name] VIDEO ASSISTED THORACOSCOPY (VATS)/EMPYEMA Right 03/28/2018   Procedure: VIDEO ASSISTED THORACOSCOPY (VATS)/EMPYEMA;  Surgeon: [name], MD;  Location: [place];  Service: Thoracic;  Laterality: Right;  [name] VIDEO BRONCHOSCOPY WITH ENDOBRONCHIAL ULTRASOUND N/A 07/19/2016   Procedure: VIDEO BRONCHOSCOPY WITH ENDOBRONCHIAL ULTRASOUND;  Surgeon: [name], MD;  Location[facility] OR;  Service: Thoracic;  Laterality: N/A;    Family History Family History  Problem Relation Age of Onset  . Cancer Mother        breast cancer  . Heart failure Brother   . Colon cancer Neg Hx        not sure, ?grandfather and/or uncle     Social History  reports that he has been smoking cigarettes.  He has a 9.50 pack-year smoking history. He has  quit using smokeless tobacco. His smokeless tobacco use included chew. He reports that he drinks about 1.2 oz of alcohol per week. He reports that he has current or past drug history. Drug: Marijuana. Frequency: 7.00 times per week.  Medications  Current Outpatient Medications:  .  albuterol (PROVENTIL) (2.5 MG/3ML) 0.083% nebulizer solution, Take 3 mLs (2.5 mg total) by nebulization every 6 (six) hours as needed for wheezing or shortness of breath (if unable to experience relief by using combivent inhaler)., Disp: 75 mL, Rfl: 4 .  ALPRAZolam (XANAX) 1 MG tablet, Take 1 mg by mouth 2 (two) times daily  as needed for anxiety or sleep (takes 1 tablet and bedtime for sleep and 1 dose during the day only if needed). , Disp: , Rfl:  .  amoxicillin-clavulanate (AUGMENTIN) 500-125 MG tablet, Take 1 tablet (500 mg total) by mouth every 8 (eight) hours for 11 days., Disp: 33 tablet, Rfl: 0 .  apixaban (ELIQUIS) 5 MG TABS tablet, Take 1 tablet (5 mg total) by mouth 2 (two) times daily., Disp: 60 tablet, Rfl: 2 .  Artificial Tear Solution (SOOTHE XP) SOLN, Apply 2 drops to eye daily., Disp: , Rfl:  .  Atezolizumab (TECENTRIQ IV), Inject into the vein. Every 3 weeks, Disp: , Rfl:  .  escitalopram (LEXAPRO) 20 MG tablet, Take 20 mg by mouth daily. , Disp: , Rfl:  .  gabapentin (NEURONTIN) 600 MG tablet, Take 600 mg by mouth 2 (two) times daily. , Disp: , Rfl:  .  guaiFENesin (MUCINEX) 600 MG 12 hr tablet, Take 600 mg by mouth 2 (two) times daily as needed (for congestion.)., Disp: , Rfl:  .  Ipratropium-Albuterol (COMBIVENT) 20-100 MCG/ACT AERS respimat, Inhale 1 puff into the lungs every 6 (six) hours as needed for wheezing or shortness of breath., Disp: 1 Inhaler, Rfl: 3 .  lactulose (CHRONULAC) 10 GM/15ML solution, Take 15 mLs (10 g total) by mouth 3 (three) times daily as needed for mild constipation or moderate constipation., Disp: 240 mL, Rfl: 0 .  loperamide (IMODIUM) 2 MG capsule, Take 1 capsule (2 mg  total) by mouth as needed for diarrhea or loose stools., Disp: 20 capsule, Rfl: 0 .  methadone (DOLOPHINE) 10 MG tablet, Take 20 mg by mouth 5 (five) times daily. Takes 2 tablets 5 times daily, Disp: , Rfl:  .  mirtazapine (REMERON) 15 MG tablet, Take 1 tablet by mouth at bedtime., Disp: , Rfl:  .  Multiple Vitamin (MULTIVITAMIN WITH MINERALS) TABS tablet, Take 1 tablet by mouth daily. Centrum, Disp: , Rfl:  .  ondansetron (ZOFRAN) 8 MG tablet, TAKE 1 TABLET BY MOUTH EVERY 8 HOURS AS NEEDED FOR NAUSEA AND VOMITING., Disp: 60 tablet, Rfl: 1 .  pantoprazole (PROTONIX) 40 MG tablet, TAKE (1) TABLET BY MOUTH TWICE DAILY., Disp: 60 tablet, Rfl: 0 .  potassium chloride SA (K-DUR,KLOR-CON) 20 MEQ tablet, Take 1 tablet (20 mEq total) by mouth daily., Disp: 60 tablet, Rfl: 1 .  saccharomyces boulardii (FLORASTOR) 250 MG capsule, Take 1 capsule (250 mg total) by mouth 2 (two) times daily for 24 days., Disp: 48 capsule, Rfl: 0 .  tiZANidine (ZANAFLEX) 4 MG tablet, Take 4 mg by mouth every 8 (eight) hours as needed for muscle spasms. , Disp: , Rfl:  .  varenicline (CHANTIX) 0.5 MG tablet, TAKE (2) TABLETS BY MOUTH TWICE DAILY., Disp: 112 tablet, Rfl: 0 No current facility-administered medications for this visit.   Facility-Administered Medications Ordered in Other Visits:  .  0.9 %  sodium chloride infusion, , Intravenous, Continuous, Kef[place]as, Manon Hilding, PA-C, Last Rate: 10 mL/hr at 01/13/17 1330  Allergies Demeclocycline and Tetracyclines & related  Review of Systems Review of Systems - Oncology ROS as per HPI otherwise 12 point ROS is negative.   Physical Exam  Vitals Wt Readings from Last 3 Encounters:  03/27/18 212 lb 4.8 oz (96.3 kg)  03/14/18 219 lb 9.6 oz (99.6 kg)  03/08/18 220 lb 10.9 oz (100.1 kg)   Temp Readings from Last 3 Encounters:  04/17/18 97.8 F (36.6 C) (Oral)  04/08/18 98.5 F (36.9 C) (Oral)  03/14/18 98.2 F (  36.8 C) (Oral)   BP Readings from Last 3 Encounters:   04/17/18 (!) 157/86  04/08/18 121/77  03/14/18 (!) 131/91   Pulse Readings from Last 3 Encounters:  04/17/18 (!) 120  04/08/18 (!) 113  03/14/18 (!) 113   Constitutional: Well-developed, well-nourished, and in no distress.   HENT: Head: Normocephalic and atraumatic.  Mouth/Throat: No oropharyngeal exudate. Mucosa moist. Eyes: Pupils are equal, round, and reactive to light. Conjunctivae are normal. No scleral icterus.  Neck: Normal range of motion. Neck supple. No JVD present.  Cardiovascular: Normal rate, regular rhythm and normal heart sounds.  Exam reveals no gallop and no friction rub.   No murmur heard. Pulmonary/Chest: Right chest wall covered with bandages.  He has some Subcutaneous emphysema.  He and some minimally decreased BS on right .   Abdominal: Soft. Bowel sounds are normal. No distension. There is no tenderness. There is no guarding.  Musculoskeletal: No edema or tenderness.  Lymphadenopathy: No cervical, axillary or supraclavicular adenopathy.  Neurological: Alert and oriented to person, place, and time. No cranial nerve deficit.  Skin: Skin is warm and dry. No rash noted. No erythema. No pallor.  Psychiatric: Affect and judgment normal.   Labs Appointment on 04/17/2018  Component Date Value Ref Range Status  . WBC 04/17/2018 9.2  4.0 - 10.5 K/uL Final  . RBC 04/17/2018 3.61* 4.22 - 5.81 MIL/uL Final  . Hemoglobin 04/17/2018 10.3* 13.0 - 17.0 g/dL Final  . HCT 04/17/2018 33.2* 39.0 - 52.0 % Final  . MCV 04/17/2018 92.0  78.0 - 100.0 fL Final  . MCH 04/17/2018 28.5  26.0 - 34.0 pg Final  . MCHC 04/17/2018 31.0  30.0 - 36.0 g/dL Final  . RDW 04/17/2018 15.7* 11.5 - 15.5 % Final  . Platelets 04/17/2018 306  150 - 400 K/uL Final  . Neutrophils Relative % 04/17/2018 73  % Final  . Neutro Abs 04/17/2018 6.7  1.7 - 7.7 K/uL Final  . Lymphocytes Relative 04/17/2018 13  % Final  . Lymphs Abs 04/17/2018 1.2  0.7 - 4.0 K/uL Final  . Monocytes Relative 04/17/2018 9  %  Final  . Monocytes Absolute 04/17/2018 0.8  0.1 - 1.0 K/uL Final  . Eosinophils Relative 04/17/2018 5  % Final  . Eosinophils Absolute 04/17/2018 0.5  0.0 - 0.7 K/uL Final  . Basophils Relative 04/17/2018 0  % Final  . Basophils Absolute 04/17/2018 0.0  0.0 - 0.1 K/uL Final   Performed at [facility], [address], [place], [place] [address]  . Sodium 04/17/2018 136  135 - 145 mmol/L Final  . Potassium 04/17/2018 3.3* 3.5 - 5.1 mmol/L Final  . Chloride 04/17/2018 93* 101 - 111 mmol/L Final  . CO2 04/17/2018 35* 22 - 32 mmol/L Final  . Glucose, Bld 04/17/2018 119* 65 - 99 mg/dL Final  . BUN 04/17/2018 15  6 - 20 mg/dL Final  . Creatinine, Ser 04/17/2018 1.47* 0.61 - 1.24 mg/dL Final  . Calcium 04/17/2018 8.3* 8.9 - 10.3 mg/dL Final  . Total Protein 04/17/2018 7.9  6.5 - 8.1 g/dL Final  . Albumin 04/17/2018 2.0* 3.5 - 5.0 g/dL Final  . AST 04/17/2018 70* 15 - 41 U/L Final  . ALT 04/17/2018 36  17 - 63 U/L Final  . Alkaline Phosphatase 04/17/2018 267* 38 - 126 U/L Final  . Total Bilirubin 04/17/2018 0.4  0.3 - 1.2 mg/dL Final  . GFR calc non Af Amer 04/17/2018 52* >60 mL/min Final  . GFR calc Af  Amer 04/17/2018 >60  >60 mL/min Final   Comment: (NOTE) The eGFR has been calculated using the CKD EPI equation. This calculation has not been validated in all clinical situations. eGFR's persistently <60 mL/min signify possible Chronic Kidney Disease.   Georgiann Hahn gap 04/17/2018 8  5 - 15 Final   Performed at [facility], [address], [place], [place] [address]  . LDH 04/17/2018 206* 98 - 192 U/L Final   Performed at [facility], [address], [place], [place] [address]     Pathology Orders Placed This Encounter  Procedures  . CBC with Differential/Platelet    Standing Status:   Future    Number of Occurrences:   1    Standing Expiration Date:   04/18/2019  . Comprehensive metabolic panel    Standing Status:   Future    Number of Occurrences:   1    Standing Expiration Date:    04/18/2019  . Lactate dehydrogenase    Standing Status:   Future    Number of Occurrences:   1    Standing Expiration Date:   04/18/2019  . Hepatitis B surface antibody    Standing Status:   Future    Number of Occurrences:   1    Standing Expiration Date:   04/17/2019  . Hepatitis B surface antigen    Standing Status:   Future    Number of Occurrences:   1    Standing Expiration Date:   04/17/2019  . Hepatitis B core antibody, total  . Hepatitis panel, acute    Standing Status:   Future    Number of Occurrences:   1    Standing Expiration Date:   04/17/2019  . Hepatitis C RNA quantitative    Standing Status:   Future    Number of Occurrences:   1    Standing Expiration Date:   04/17/2019       Zoila Shutter MD

## 2018-04-18 ENCOUNTER — Inpatient Hospital Stay (HOSPITAL_COMMUNITY)
Admission: AD | Admit: 2018-04-18 | Discharge: 2018-05-01 | DRG: 166 | Disposition: A | Discharge status: Home Health | Payer: Medicare Other | Attending: Thoracic Surgery (Cardiothoracic Vascular Surgery) | Admitting: Thoracic Surgery (Cardiothoracic Vascular Surgery)

## 2018-04-18 ENCOUNTER — Other Ambulatory Visit: Payer: Self-pay | Admitting: Thoracic Surgery (Cardiothoracic Vascular Surgery)

## 2018-04-18 ENCOUNTER — Ambulatory Visit
Admission: RE | Admit: 2018-04-18 | Discharge: 2018-04-18 | Disposition: A | Discharge status: Home / Self Care | Payer: Medicare Other | Source: Ambulatory Visit | Attending: Thoracic Surgery (Cardiothoracic Vascular Surgery) | Admitting: Thoracic Surgery (Cardiothoracic Vascular Surgery)

## 2018-04-18 ENCOUNTER — Encounter: Payer: Self-pay | Admitting: Thoracic Surgery (Cardiothoracic Vascular Surgery)

## 2018-04-18 ENCOUNTER — Ambulatory Visit (INDEPENDENT_AMBULATORY_CARE_PROVIDER_SITE_OTHER): Payer: Self-pay | Admitting: Thoracic Surgery (Cardiothoracic Vascular Surgery)

## 2018-04-18 VITALS — BP 108/70 | HR 122 | Resp 18 | Ht 72.0 in | Wt 210.0 lb

## 2018-04-18 DIAGNOSIS — T8141XA Infection following a procedure, superficial incisional surgical site, initial encounter: Secondary | ICD-10-CM | POA: Diagnosis present

## 2018-04-18 DIAGNOSIS — Q0700 Arnold-Chiari syndrome without spina bifida or hydrocephalus: Secondary | ICD-10-CM | POA: Diagnosis not present

## 2018-04-18 DIAGNOSIS — Z09 Encounter for follow-up examination after completed treatment for conditions other than malignant neoplasm: Secondary | ICD-10-CM

## 2018-04-18 DIAGNOSIS — Z515 Encounter for palliative care: Secondary | ICD-10-CM | POA: Diagnosis not present

## 2018-04-18 DIAGNOSIS — I96 Gangrene, not elsewhere classified: Secondary | ICD-10-CM | POA: Diagnosis present

## 2018-04-18 DIAGNOSIS — T148XXA Other injury of unspecified body region, initial encounter: Secondary | ICD-10-CM

## 2018-04-18 DIAGNOSIS — Z1621 Resistance to vancomycin: Secondary | ICD-10-CM | POA: Diagnosis present

## 2018-04-18 DIAGNOSIS — Z881 Allergy status to other antibiotic agents status: Secondary | ICD-10-CM

## 2018-04-18 DIAGNOSIS — Z86718 Personal history of other venous thrombosis and embolism: Secondary | ICD-10-CM | POA: Diagnosis not present

## 2018-04-18 DIAGNOSIS — B952 Enterococcus as the cause of diseases classified elsewhere: Secondary | ICD-10-CM | POA: Diagnosis present

## 2018-04-18 DIAGNOSIS — I10 Essential (primary) hypertension: Secondary | ICD-10-CM | POA: Diagnosis present

## 2018-04-18 DIAGNOSIS — J869 Pyothorax without fistula: Secondary | ICD-10-CM

## 2018-04-18 DIAGNOSIS — M1990 Unspecified osteoarthritis, unspecified site: Secondary | ICD-10-CM | POA: Diagnosis present

## 2018-04-18 DIAGNOSIS — Z86711 Personal history of pulmonary embolism: Secondary | ICD-10-CM

## 2018-04-18 DIAGNOSIS — Z7189 Other specified counseling: Secondary | ICD-10-CM

## 2018-04-18 DIAGNOSIS — Z79899 Other long term (current) drug therapy: Secondary | ICD-10-CM

## 2018-04-18 DIAGNOSIS — J9621 Acute and chronic respiratory failure with hypoxia: Secondary | ICD-10-CM | POA: Diagnosis present

## 2018-04-18 DIAGNOSIS — E669 Obesity, unspecified: Secondary | ICD-10-CM | POA: Diagnosis present

## 2018-04-18 DIAGNOSIS — Z803 Family history of malignant neoplasm of breast: Secondary | ICD-10-CM

## 2018-04-18 DIAGNOSIS — K219 Gastro-esophageal reflux disease without esophagitis: Secondary | ICD-10-CM | POA: Diagnosis present

## 2018-04-18 DIAGNOSIS — F419 Anxiety disorder, unspecified: Secondary | ICD-10-CM | POA: Diagnosis present

## 2018-04-18 DIAGNOSIS — Z87891 Personal history of nicotine dependence: Secondary | ICD-10-CM

## 2018-04-18 DIAGNOSIS — G893 Neoplasm related pain (acute) (chronic): Secondary | ICD-10-CM | POA: Diagnosis present

## 2018-04-18 DIAGNOSIS — Z8249 Family history of ischemic heart disease and other diseases of the circulatory system: Secondary | ICD-10-CM

## 2018-04-18 DIAGNOSIS — M545 Low back pain: Secondary | ICD-10-CM | POA: Diagnosis not present

## 2018-04-18 DIAGNOSIS — Z923 Personal history of irradiation: Secondary | ICD-10-CM

## 2018-04-18 DIAGNOSIS — Y838 Other surgical procedures as the cause of abnormal reaction of the patient, or of later complication, without mention of misadventure at the time of the procedure: Secondary | ICD-10-CM | POA: Diagnosis present

## 2018-04-18 DIAGNOSIS — T888XXA Other specified complications of surgical and medical care, not elsewhere classified, initial encounter: Secondary | ICD-10-CM | POA: Diagnosis present

## 2018-04-18 DIAGNOSIS — A491 Streptococcal infection, unspecified site: Secondary | ICD-10-CM

## 2018-04-18 DIAGNOSIS — J449 Chronic obstructive pulmonary disease, unspecified: Secondary | ICD-10-CM | POA: Diagnosis present

## 2018-04-18 DIAGNOSIS — R000 Tachycardia, unspecified: Secondary | ICD-10-CM | POA: Diagnosis present

## 2018-04-18 DIAGNOSIS — T797XXA Traumatic subcutaneous emphysema, initial encounter: Secondary | ICD-10-CM | POA: Diagnosis present

## 2018-04-18 DIAGNOSIS — Z8701 Personal history of pneumonia (recurrent): Secondary | ICD-10-CM

## 2018-04-18 DIAGNOSIS — Z7901 Long term (current) use of anticoagulants: Secondary | ICD-10-CM

## 2018-04-18 DIAGNOSIS — L089 Local infection of the skin and subcutaneous tissue, unspecified: Secondary | ICD-10-CM | POA: Diagnosis present

## 2018-04-18 DIAGNOSIS — Z6829 Body mass index (BMI) 29.0-29.9, adult: Secondary | ICD-10-CM | POA: Diagnosis not present

## 2018-04-18 DIAGNOSIS — C3491 Malignant neoplasm of unspecified part of right bronchus or lung: Secondary | ICD-10-CM | POA: Diagnosis present

## 2018-04-18 DIAGNOSIS — F329 Major depressive disorder, single episode, unspecified: Secondary | ICD-10-CM | POA: Diagnosis present

## 2018-04-18 DIAGNOSIS — G8929 Other chronic pain: Secondary | ICD-10-CM | POA: Diagnosis not present

## 2018-04-18 DIAGNOSIS — I459 Conduction disorder, unspecified: Secondary | ICD-10-CM | POA: Diagnosis present

## 2018-04-18 DIAGNOSIS — I9789 Other postprocedural complications and disorders of the circulatory system, not elsewhere classified: Secondary | ICD-10-CM | POA: Diagnosis not present

## 2018-04-18 DIAGNOSIS — R509 Fever, unspecified: Secondary | ICD-10-CM | POA: Diagnosis present

## 2018-04-18 LAB — CBC
HEMATOCRIT: 31.5 % — AB (ref 39.0–52.0)
HEMOGLOBIN: 9.8 g/dL — AB (ref 13.0–17.0)
MCH: 28.5 pg (ref 26.0–34.0)
MCHC: 31.1 g/dL (ref 30.0–36.0)
MCV: 91.6 fL (ref 78.0–100.0)
Platelets: 274 10*3/uL (ref 150–400)
RBC: 3.44 MIL/uL — ABNORMAL LOW (ref 4.22–5.81)
RDW: 15.7 % — AB (ref 11.5–15.5)
WBC: 10.2 10*3/uL (ref 4.0–10.5)

## 2018-04-18 LAB — BASIC METABOLIC PANEL
ANION GAP: 10 (ref 5–15)
BUN: 13 mg/dL (ref 6–20)
CALCIUM: 8.2 mg/dL — AB (ref 8.9–10.3)
CO2: 32 mmol/L (ref 22–32)
Chloride: 94 mmol/L — ABNORMAL LOW (ref 101–111)
Creatinine, Ser: 1.37 mg/dL — ABNORMAL HIGH (ref 0.61–1.24)
GFR calc Af Amer: >60 mL/min (ref >60)
GFR calc non Af Amer: 57 mL/min — ABNORMAL LOW (ref >60)
Glucose, Bld: 106 mg/dL — ABNORMAL HIGH (ref 65–99)
POTASSIUM: 3.4 mmol/L — AB (ref 3.5–5.1)
Sodium: 136 mmol/L (ref 135–145)

## 2018-04-18 LAB — HEPATITIS B SURFACE ANTIGEN: HEP B S AG: NEGATIVE

## 2018-04-18 LAB — URINALYSIS, ROUTINE W REFLEX MICROSCOPIC
Bacteria, UA: NONE SEEN
Bilirubin Urine: NEGATIVE
Glucose, UA: NEGATIVE mg/dL
KETONES UR: NEGATIVE mg/dL
LEUKOCYTES UA: NEGATIVE
Nitrite: NEGATIVE
PROTEIN: NEGATIVE mg/dL
SPECIFIC GRAVITY, URINE: 1.003 — AB (ref 1.005–1.030)
pH: 7 (ref 5.0–8.0)

## 2018-04-18 LAB — HEPATITIS PANEL, ACUTE
HCV Ab: 0.2 s/co ratio (ref 0.0–0.9)
HEP A IGM: NEGATIVE
HEP B C IGM: NEGATIVE
Hepatitis B Surface Ag: NEGATIVE

## 2018-04-18 LAB — HEPATITIS B SURFACE ANTIBODY,QUALITATIVE: Hep B S Ab: NONREACTIVE

## 2018-04-18 LAB — PROTIME-INR
INR: 1.37
Prothrombin Time: 16.7 s — ABNORMAL HIGH (ref 11.4–15.2)

## 2018-04-18 LAB — HEPATITIS B CORE ANTIBODY, TOTAL: Hep B Core Total Ab: NEGATIVE

## 2018-04-18 MED ORDER — SODIUM CHLORIDE 0.9% FLUSH
3.0000 mL | Freq: Two times a day (BID) | INTRAVENOUS | Status: DC
  Administered 2018-04-18 – 2018-04-30 (×13): 3 mL via INTRAVENOUS

## 2018-04-18 MED ORDER — MIRTAZAPINE 15 MG PO TABS
15.0000 mg | ORAL_TABLET | Freq: Every day | ORAL | Status: DC
  Administered 2018-04-18 – 2018-04-30 (×13): 15 mg via ORAL
  Filled 2018-04-18 (×14): qty 1

## 2018-04-18 MED ORDER — ACETAMINOPHEN 650 MG RE SUPP: 650.0000 mg | Freq: Four times a day (QID) | RECTAL | Status: DC | PRN

## 2018-04-18 MED ORDER — VANCOMYCIN HCL 10 G IV SOLR
1250.0000 mg | Freq: Two times a day (BID) | INTRAVENOUS | Status: DC
  Administered 2018-04-19 – 2018-04-20 (×4): 1250 mg via INTRAVENOUS
  Filled 2018-04-18 (×5): qty 1250

## 2018-04-18 MED ORDER — HYPROMELLOSE (GONIOSCOPIC) 2.5 % OP SOLN: 1.0000 [drp] | Freq: Every day | OPHTHALMIC | Status: DC

## 2018-04-18 MED ORDER — POLYVINYL ALCOHOL 1.4 % OP SOLN
1.0000 [drp] | Freq: Every day | OPHTHALMIC | Status: DC
  Administered 2018-04-20 – 2018-05-01 (×6): 1 [drp] via OPHTHALMIC
  Filled 2018-04-18: qty 15

## 2018-04-18 MED ORDER — ALPRAZOLAM 0.5 MG PO TABS
1.0000 mg | ORAL_TABLET | Freq: Two times a day (BID) | ORAL | Status: DC | PRN
  Administered 2018-04-18 – 2018-04-30 (×11): 1 mg via ORAL
  Filled 2018-04-18 (×11): qty 2

## 2018-04-18 MED ORDER — VARENICLINE TARTRATE 0.5 MG PO TABS
0.5000 mg | ORAL_TABLET | Freq: Every day | ORAL | Status: DC
  Administered 2018-04-18 – 2018-05-01 (×12): 0.5 mg via ORAL
  Filled 2018-04-18 (×15): qty 1

## 2018-04-18 MED ORDER — ONDANSETRON HCL 4 MG/2ML IJ SOLN: 4.0000 mg | Freq: Four times a day (QID) | INTRAMUSCULAR | Status: DC | PRN

## 2018-04-18 MED ORDER — PANTOPRAZOLE SODIUM 40 MG PO TBEC
40.0000 mg | DELAYED_RELEASE_TABLET | Freq: Two times a day (BID) | ORAL | Status: DC
  Administered 2018-04-18 – 2018-04-30 (×23): 40 mg via ORAL
  Filled 2018-04-18 (×24): qty 1

## 2018-04-18 MED ORDER — GABAPENTIN 300 MG PO CAPS
600.0000 mg | ORAL_CAPSULE | Freq: Two times a day (BID) | ORAL | Status: DC
  Administered 2018-04-18 – 2018-05-01 (×24): 600 mg via ORAL
  Filled 2018-04-18 (×25): qty 2

## 2018-04-18 MED ORDER — LEVALBUTEROL HCL 0.63 MG/3ML IN NEBU
0.6300 mg | INHALATION_SOLUTION | Freq: Three times a day (TID) | RESPIRATORY_TRACT | Status: DC | PRN
Start: 2018-04-18 — End: 2018-05-01

## 2018-04-18 MED ORDER — TRAMADOL HCL 50 MG PO TABS
50.0000 mg | ORAL_TABLET | Freq: Four times a day (QID) | ORAL | Status: DC | PRN
  Administered 2018-04-24: 50 mg via ORAL
  Filled 2018-04-18: qty 1

## 2018-04-18 MED ORDER — ACETAMINOPHEN 325 MG PO TABS: 650.0000 mg | ORAL_TABLET | Freq: Four times a day (QID) | ORAL | Status: DC | PRN

## 2018-04-18 MED ORDER — ALPRAZOLAM 0.5 MG PO TABS: 1.0000 mg | ORAL_TABLET | Freq: Two times a day (BID) | ORAL | Status: DC

## 2018-04-18 MED ORDER — SODIUM CHLORIDE 0.9 % IV SOLN
1750.0000 mg | Freq: Once | INTRAVENOUS | Status: AC
  Administered 2018-04-18: 1750 mg via INTRAVENOUS
  Filled 2018-04-18: qty 1750

## 2018-04-18 MED ORDER — ESCITALOPRAM OXALATE 20 MG PO TABS
20.0000 mg | ORAL_TABLET | Freq: Every day | ORAL | Status: DC
  Administered 2018-04-18 – 2018-04-25 (×6): 20 mg via ORAL
  Filled 2018-04-18 (×6): qty 1

## 2018-04-18 MED ORDER — SODIUM CHLORIDE 0.9 % IV SOLN: 250.0000 mL | INTRAVENOUS | Status: DC | PRN

## 2018-04-18 MED ORDER — PIPERACILLIN-TAZOBACTAM 3.375 G IVPB
3.3750 g | Freq: Three times a day (TID) | INTRAVENOUS | Status: DC
  Administered 2018-04-18 – 2018-04-23 (×15): 3.375 g via INTRAVENOUS
  Filled 2018-04-18 (×15): qty 50

## 2018-04-18 MED ORDER — METHADONE HCL 10 MG PO TABS
20.0000 mg | ORAL_TABLET | Freq: Every day | ORAL | Status: DC
  Administered 2018-04-18 – 2018-04-26 (×35): 20 mg via ORAL
  Filled 2018-04-18 (×5): qty 2
  Filled 2018-04-18: qty 4
  Filled 2018-04-18 (×29): qty 2

## 2018-04-18 MED ORDER — SODIUM CHLORIDE 0.9% FLUSH: 3.0000 mL | INTRAVENOUS | Status: DC | PRN

## 2018-04-18 MED ORDER — ONDANSETRON HCL 4 MG PO TABS: 4.0000 mg | ORAL_TABLET | Freq: Four times a day (QID) | ORAL | Status: DC | PRN

## 2018-04-18 NOTE — Progress Notes (Signed)
Pharmacy Antibiotic Note  Erik [name] is a 57 y.o. male admitted on 04/18/2018 with R chest wall infection going for VATS.  Pharmacy has been consulted for vancomycin and Zosyn dosing.  Patient with recent admission in April during which he had chest tubes placed for hemithorax and empyema. Chest tubes were removed and he was discharged on 04/08/18 with Augmentin. Returns after being seen in the office today with a fever and tachypnea. His CXR shows increasing air collection in lungs > admitted for IV antibiotics and surgical exploration of R chest wound.  He was on vancomycin during his last admission- a vancomycin trough on 4/25 was 83mcg/mL on a regimen of 1g IV q8h, however SCr on that day was 0.54g/dL. SCr today is 1.47 (slightly increased from SCr on discharge).  Plan: Vancomycin 1750mg  IV x1, then start 1250mg  IV q12h Zosyn 3.375g IV q8h EI Follow c/s, clinical progression, renal function, level PRN  Height: 6' (182.9 cm) Weight: 208 lb 8.9 oz (94.6 kg) IBW/kg (Calculated) : 77.6  Temp (24hrs), Avg:98.6 F (37 C), Min:98.6 F (37 C), Max:98.6 F (37 C)  Recent Labs  Lab 04/17/18 1549  WBC 9.2  CREATININE 1.47*    Estimated Creatinine Clearance: 67.8 mL/min (A) (by C-G formula based on SCr of 1.47 mg/dL (H)).    Allergies  Allergen Reactions  . Demeclocycline Other (See Comments) and Swelling  . Tetracyclines & Related Swelling and Other (See Comments)         Antimicrobials this admission: Vancomycin 5/14 >>  Zosyn 5/14 >>   Dose adjustments this admission: n/a  Microbiology results: None drawn  Thank you for allowing pharmacy to be a part of this patient's care.  Marycatherine Maniscalco D. Breyden Jeudy, PharmD, [place] Clinical Pharmacist [phone] 04/18/2018 7:26 PM

## 2018-04-18 NOTE — Progress Notes (Signed)
04/18/2018 1730 Received pt to room 4E-22 a direct admit from Dr. Leonarda Salon office with a R sided empyema.  Pt is A&O, no c/o pain.  Tele monitor applied and CCMD notified.  CHG bath completed.  Oriented to room, call light and bed.  Call bell in reach, family at bedside. Carney Corners

## 2018-04-18 NOTE — H&P (Addendum)
[address]            [place] [address]          [phone]     Erik [name] is an 57 y.o. male. 1961-05-27 [id number]   Chief Complaint: Right chest wall infection  History of Presenting Illness:  This is a 57 year old gentleman with a history of stage III lung cancer diagnosed in 2017.  He also has a history of COPD with hypoxic respiratory failure, DVT, chronic cancer related pain, depression, anxiety, hypertension.  He was admitted to the emergency room in April with shortness of breath and right-sided pleuritic chest pain.  He was febrile and hypoxic.  Chest x-ray showed air-fluid levels in the right hemithorax.  CT showed a large empyema in the right pleural space.  Dr. Roxan Hockey did a right VATS and decortication on 03/28/2018.  He did have an air leak post operatively, which did resolve. We were able to get his chest tubes out and he was eventually discharged on 04/08/2018.  He was discharged on Augmentin.  He says that he is feeling well.  He denies fevers chills and shortness of breath.  He has some incisional discomfort.  He has noticed redness around the incision.  He was seen by Dr. Roxan Hockey in the office this afternoon. He had a fever (101.1)and was tachycardic. His chest x ray showed an increasing collection of air at the right lung base, associated air fluid levels, and increasing subcutaneous emphysema of the right chest wall. Dr. Roxan Hockey discussed with the patient the need for admission, administration of IV antibiotics, and surgical exploration of the right chest wound. Patient agreed. He is tachycardic but in no acute distress.   Past Medical History: Past Medical History:  Diagnosis Date  . Arnold-Chiari syndrome ([place])   . Arthritis   . Asthma   . Chronic back pain   . COPD (chronic obstructive pulmonary disease) ([place])   . Depression   . Dyspnea    with exertion   . GERD (gastroesophageal reflux disease)   .  Hypertension   . Pneumonia   . Pulmonary embolus ([place])   . Seasonal allergies   . Spinal stenosis of lumbar region   . 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 Wheezing     Past Surgical History: Past Surgical History:  Procedure Laterality Date  . BACK SURGERY     5 total  . BIOPSY  01/03/2017   Procedure: BIOPSY;  Surgeon: Daneil Dolin, MD;  [facility];  Service: Endoscopy;;  gastric  . COLONOSCOPY WITH PROPOFOL N/A 04/04/2017   Procedure: COLONOSCOPY WITH PROPOFOL;  Surgeon: Daneil Dolin, MD;  [facility];  Service: Endoscopy;  Laterality: N/A;  8:45am  . DECORTICATION Right 03/28/2018   Procedure: DECORTICATION;  Surgeon: [name], MD;  Location: [facility] OR;  Service: Thoracic;  Laterality: Right;  . ESOPHAGOGASTRODUODENOSCOPY (EGD) WITH PROPOFOL N/A 01/03/2017   Procedure: ESOPHAGOGASTRODUODENOSCOPY (EGD) WITH PROPOFOL;  Surgeon: Daneil Dolin, MD;  [facility];  Service: Endoscopy;  Laterality: N/A;  . Venia Minks DILATION N/A 01/03/2017   Procedure: Venia Minks DILATION;  Surgeon: Daneil Dolin, MD;  [facility];  Service: Endoscopy;  Laterality: N/A;  . MULTIPLE EXTRACTIONS WITH ALVEOLOPLASTY N/A 07/08/2014   Procedure: MULTIPLE EXTRACION WITH ALVEOLOPLASTY with EXCISION LESION RIGHT SIDE OF TONGUE;  Surgeon: Gae Bon, DDS;  Location: [facility];  Service: Oral Surgery;  Laterality: N/A;  . PORTACATH PLACEMENT Right 07/30/2016   Procedure: INSERTION PORT-A-CATH;  Surgeon: Vickie Epley, MD;  Location: [facility];  Service: Vascular;  Laterality: Right;  [name] VIDEO ASSISTED THORACOSCOPY (VATS)/EMPYEMA Right 03/28/2018   Procedure: VIDEO ASSISTED THORACOSCOPY (VATS)/EMPYEMA;  Surgeon: [name], MD;  Location: [facility];  Service: Thoracic;  Laterality: Right;  [name] VIDEO BRONCHOSCOPY WITH ENDOBRONCHIAL ULTRASOUND N/A 07/19/2016   Procedure: VIDEO BRONCHOSCOPY WITH ENDOBRONCHIAL ULTRASOUND;  Surgeon: [name], MD;  Location: [facility] OR;  Service: Thoracic;  Laterality: N/A;    Family History: Family History  Problem Relation Age of Onset  . Cancer Mother        breast cancer  . Heart failure Brother   . Colon cancer Neg Hx        not sure, ?grandfather and/or uncle    Social History: He reports that he quit smoking about 2 months ago. His smoking use included cigarettes. He has a 9.50 pack-year smoking history. He has quit using smokeless tobacco. His smokeless tobacco use included chew. He reports that he drinks about 1.2 oz of alcohol per week. He reports that he has current or past drug history. Drug: Marijuana. Frequency: 7.00 times per week.  Allergies:  Allergies  Allergen Reactions  . Demeclocycline Other (See Comments) and Swelling  . Tetracyclines & Related Swelling and Other (See Comments)         Medications Prior to Admission  Medication Sig Dispense Refill  . albuterol (PROVENTIL) (2.5 MG/3ML) 0.083% nebulizer solution Take 3 mLs (2.5 mg total) by nebulization every 6 (six) hours as needed for wheezing or shortness of breath (if unable to experience relief by using combivent inhaler). 75 mL 4  . ALPRAZolam (XANAX) 1 MG tablet Take 1 mg by mouth 2 (two) times daily as needed for anxiety or sleep (takes 1 tablet and bedtime for sleep and 1 dose during the day only if needed).     [name] amoxicillin-clavulanate (AUGMENTIN) 500-125 MG tablet Take 1 tablet (500 mg total) by mouth every 8 (eight) hours for 11 days. 33 tablet 0  . apixaban (ELIQUIS) 5 MG TABS tablet Take 1 tablet (5 mg total) by mouth 2 (two) times daily. 60 tablet 2  . Artificial Tear Solution (SOOTHE XP) SOLN Apply 2 drops to eye daily.    Huey Bienenstock (TECENTRIQ IV) Inject into the vein. Every 3 weeks    . escitalopram (LEXAPRO) 20 MG tablet Take 20 mg by mouth daily.     [name] gabapentin (NEURONTIN) 600 MG tablet Take 600 mg by mouth 2 (two) times daily.     [name] guaiFENesin (MUCINEX) 600 MG 12 hr tablet Take 600  mg by mouth 2 (two) times daily as needed (for congestion.).    [name] Ipratropium-Albuterol (COMBIVENT) 20-100 MCG/ACT AERS respimat Inhale 1 puff into the lungs every 6 (six) hours as needed for wheezing or shortness of breath. 1 Inhaler 3  . lactulose (CHRONULAC) 10 GM/15ML solution Take 15 mLs (10 g total) by mouth 3 (three) times daily as needed for mild constipation or moderate constipation. 240 mL 0  . loperamide (IMODIUM) 2 MG capsule Take 1 capsule (2 mg total) by mouth as needed for diarrhea or loose stools. 20 capsule 0  . methadone (DOLOPHINE) 10 MG tablet Take 20 mg by mouth 5 (five) times daily. Takes 2 tablets 5 times daily    . mirtazapine (REMERON)  15 MG tablet Take 1 tablet by mouth at bedtime.    . Multiple Vitamin (MULTIVITAMIN WITH MINERALS) TABS tablet Take 1 tablet by mouth daily. Centrum    . ondansetron (ZOFRAN) 8 MG tablet TAKE 1 TABLET BY MOUTH EVERY 8 HOURS AS NEEDED FOR NAUSEA AND VOMITING. 60 tablet 1  . pantoprazole (PROTONIX) 40 MG tablet TAKE (1) TABLET BY MOUTH TWICE DAILY. 60 tablet 0  . potassium chloride SA (K-DUR,KLOR-CON) 20 MEQ tablet Take 1 tablet (20 mEq total) by mouth daily. 60 tablet 1  . saccharomyces boulardii (FLORASTOR) 250 MG capsule Take 1 capsule (250 mg total) by mouth 2 (two) times daily for 24 days. 48 capsule 0  . tiZANidine (ZANAFLEX) 4 MG tablet Take 4 mg by mouth every 8 (eight) hours as needed for muscle spasms.     . varenicline (CHANTIX) 0.5 MG tablet TAKE (2) TABLETS BY MOUTH TWICE DAILY. 112 tablet 0   Review of Systems:  Cardiac Review of Systems: Y or N  Chest Pain [ N ]  SOB [ N ] Pedal Edema [Y ] Syncope [ N] Presyncope [ N ]  General Review of Systems: [Y] = yes [N ]=no  Constitional: nausea Aqua.Slicker ]; night sweats Aqua.Slicker ]; fever [Y-at office ]; or chills Aqua.Slicker ];  Resp: cough Aqua.Slicker ]; wheezing[N ]; hemoptysis[N ];   BP (!) 125/92 (BP Location: Right Arm)   Pulse (!) 119   Temp 98.6 F (37 C) (Oral)   Resp 18   Ht 6' (1.829 m)   Wt 208 lb  8.9 oz (94.6 kg)   SpO2 92%   BMI 28.29 kg/m   Physical Exam: General appearance: alert, cooperative and tachycardic Heart: Tachycardic  Lungs: Clear on the left and diminished on the right. There is subcutaneous emphysema right chest wall, neck, and right arm Abdomen: Soft, obese, non tender, bowel sounds present Extremities: ++ pitting edema bilateral LEs Wound: Right anterior chest wound with erythema, swelling, no drainage   Diagnostic Studies and Laboratory Results: Results for orders placed or performed in visit on 04/17/18 (from the past 48 hour(s))  CBC with Differential/Platelet     Status: Abnormal   Collection Time: 04/17/18  3:49 PM  Result Value Ref Range   WBC 9.2 4.0 - 10.5 K/uL   RBC 3.61 (L) 4.22 - 5.81 MIL/uL   Hemoglobin 10.3 (L) 13.0 - 17.0 g/dL   HCT 33.2 (L) 39.0 - 52.0 %   MCV 92.0 78.0 - 100.0 fL   MCH 28.5 26.0 - 34.0 pg   MCHC 31.0 30.0 - 36.0 g/dL   RDW 15.7 (H) 11.5 - 15.5 %   Platelets 306 150 - 400 K/uL   Neutrophils Relative % 73 %   Neutro Abs 6.7 1.7 - 7.7 K/uL   Lymphocytes Relative 13 %   Lymphs Abs 1.2 0.7 - 4.0 K/uL   Monocytes Relative 9 %   Monocytes Absolute 0.8 0.1 - 1.0 K/uL   Eosinophils Relative 5 %   Eosinophils Absolute 0.5 0.0 - 0.7 K/uL   Basophils Relative 0 %   Basophils Absolute 0.0 0.0 - 0.1 K/uL    Comment: Performed at [facility], [address] [place], [place] [address]  Comprehensive metabolic panel     Status: Abnormal   Collection Time: 04/17/18  3:49 PM  Result Value Ref Range   Sodium 136 135 - 145 mmol/L   Potassium 3.3 (L) 3.5 - 5.1 mmol/L   Chloride 93 (L) 101 - 111 mmol/L  CO2 35 (H) 22 - 32 mmol/L   Glucose, Bld 119 (H) 65 - 99 mg/dL   BUN 15 6 - 20 mg/dL   Creatinine, Ser 1.47 (H) 0.61 - 1.24 mg/dL   Calcium 8.3 (L) 8.9 - 10.3 mg/dL   Total Protein 7.9 6.5 - 8.1 g/dL   Albumin 2.0 (L) 3.5 - 5.0 g/dL   AST 70 (H) 15 - 41 U/L   ALT 36 17 - 63 U/L   Alkaline Phosphatase 267 (H) 38 - 126 U/L    Total Bilirubin 0.4 0.3 - 1.2 mg/dL   GFR calc non Af Amer 52 (L) >60 mL/min   GFR calc Af Amer >60 >60 mL/min    Comment: (NOTE) The eGFR has been calculated using the CKD EPI equation. This calculation has not been validated in all clinical situations. eGFR's persistently <60 mL/min signify possible Chronic Kidney Disease.    Anion gap 8 5 - 15    Comment: Performed at [facility], [address], [place], [place] [address]  Lactate dehydrogenase     Status: Abnormal   Collection Time: 04/17/18  3:49 PM  Result Value Ref Range   LDH 206 (H) 98 - 192 U/L    Comment: Performed at [facility], [address] [name], [place] [address]  Hepatitis B surface antibody     Status: None   Collection Time: 04/17/18  3:49 PM  Result Value Ref Range   Hep B S Ab Non Reactive     Comment: (NOTE)              Non Reactive: Inconsistent with immunity,                            less than 10 mIU/mL              Reactive:     Consistent with immunity,                            greater than 9.9 mIU/mL Performed At: [facility] [place], [place] [id number] Rush Farmer MD [id number] Performed at [facility], [address], [place], [place] [address]   Hepatitis B surface antigen     Status: None   Collection Time: 04/17/18  3:49 PM  Result Value Ref Range   Hepatitis B Surface Ag Negative Negative    Comment: (NOTE) Performed At: [facility] [place], [place] [id number] Rush Farmer MD [id number] Performed at [facility], [address] [place], [place] [address]   Hepatitis panel, acute     Status: None   Collection Time: 04/17/18  3:49 PM  Result Value Ref Range   Hepatitis B Surface Ag Negative Negative   HCV Ab 0.2 0.0 - 0.9 s/co ratio    Comment: (NOTE)                                  Negative:     < 0.8                             Indeterminate: 0.8 - 0.9  Positive:     > 0.9 The CDC  recommends that a positive HCV antibody result be followed up with a HCV Nucleic Acid Amplification test ([id number]). Performed At: [facility] [place], [place] [id number] Rush Farmer MD [id number]    Hep A IgM Negative Negative   Hep B C IgM Negative Negative    Comment: Performed at [facility], [address] [place], [place] [address]   Dg Chest 2 View  Addendum Date: 04/18/2018   ADDENDUM REPORT: 04/18/2018 17:20 ADDENDUM: I called these results by telephone on 04/18/2018 at 5:19 pm to Dr. Servando Snare , who verbally acknowledged these results and will relay the information to Dr. Roxan Hockey. Electronically Signed   By: Franki Cabot M.D.   On: 04/18/2018 17:20   Result Date: 04/18/2018 CLINICAL DATA:  Pt c/o empyema of right lung since 03/28/18. Pt complains of tiredness. Hx of Arnold-Chiari syndrome, asthma, COPD, HTN, pneumonia, pulmonary embolus, right side lung caner 2017. EXAM: CHEST - 2 VIEW COMPARISON:  Chest x-rays dated 04/08/2018 and 04/07/2018. Chest CT dated 03/23/2018. FINDINGS: Compared to the recent chest x-rays, there is an increasing collection of air at the RIGHT lung base, with associated air-fluid levels compatible with the given history of empyema, presumably reaccumulating. There is also increasing subcutaneous emphysema over the RIGHT lateral chest wall, which may indicate associated fistulous connection. LEFT lung is clear. Heart size and mediastinal contours are grossly stable. RIGHT chest wall Port-A-Cath is stable in position. IMPRESSION: 1. Increased collection of air at the RIGHT lung base compared to most recent chest x-ray of 04/08/2018, now measuring approximately 8.6 cm greatest dimension, with associated air-fluid level, compatible with the given history of empyema, presumably interval worsening/reaccumulation. Consider follow-up chest CT for further characterization. 2. Also, increased subcutaneous emphysema over the RIGHT lateral chest  wall. If no recent interventional procedure, this raises the possibility of underlying fistulous connection. Again, consider chest CT for further characterization. These results will be called to the ordering clinician or representative by the Radiologist Assistant, and communication documented in the PACS or zVision Dashboard. Electronically Signed: By: Franki Cabot M.D. On: 04/18/2018 16:58     Assessment/Plan Right chest wall wound infection (s/p right VATS, drain empyema, and decortication). He will be placed on Vancomycin and Zosyn. He will be taken to OR in am for incision and drainage of right chest wound. Eliquis to be held until further notice  Nani Skillern, Hershal Coria 04/18/2018, 6:26 PM  Patient seen and examined in office. Agree with above  Remo Lipps C. Roxan Hockey, MD [facility] [phone]

## 2018-04-18 NOTE — Progress Notes (Signed)
[address]       [place] [address]             [phone]       HPI: [name] returns for a scheduled follow-up visit  Erik [name] is a 57 year old gentleman with a history of stage III lung cancer diagnosed in 2017.  He also has a history of COPD with hypoxic rest tori failure, DVT, chronic cancer related pain, depression, anxiety, hypertension.  He was admitted to the emergency room in April with shortness of breath and right-sided pleuritic chest pain.  He was febrile and hypoxic.  Chest x-ray showed air-fluid levels in the right hemithorax.  CT showed a large empyema in the right pleural space.  I did a right VATS decortication on 03/28/2018.  We were able to get his chest tubes out and he was eventually discharged on 04/08/2018.  He was discharged on Augmentin.  He says that he is feeling well.  He denies fevers chills and shortness of breath.  He has some incisional discomfort.  He has noticed redness around the incision.  Past Medical History:  Diagnosis Date  . Arnold-Chiari syndrome ([place])   . Arthritis   . Asthma   . Chronic back pain   . COPD (chronic obstructive pulmonary disease) ([place])   . Depression   . Dyspnea    with exertion   . GERD (gastroesophageal reflux disease)   . Hypertension   . Pneumonia   . Pulmonary embolus ([place])   . Seasonal allergies   . Spinal stenosis of lumbar region   . 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 Wheezing    Past Surgical History:  Procedure Laterality Date  . BACK SURGERY     5 total  . BIOPSY  01/03/2017   Procedure: BIOPSY;  Surgeon: Daneil Dolin, MD;  [facility];  Service: Endoscopy;;  gastric  . COLONOSCOPY WITH PROPOFOL N/A 04/04/2017   Procedure: COLONOSCOPY WITH PROPOFOL;  Surgeon: Daneil Dolin, MD;  [facility];  Service: Endoscopy;  Laterality: N/A;  8:45am  . DECORTICATION Right 03/28/2018   Procedure: DECORTICATION;  Surgeon: [name],  MD;  Location: [facility] OR;  Service: Thoracic;  Laterality: Right;  . ESOPHAGOGASTRODUODENOSCOPY (EGD) WITH PROPOFOL N/A 01/03/2017   Procedure: ESOPHAGOGASTRODUODENOSCOPY (EGD) WITH PROPOFOL;  Surgeon: Daneil Dolin, MD;  [facility];  Service: Endoscopy;  Laterality: N/A;  . Venia Minks DILATION N/A 01/03/2017   Procedure: Venia Minks DILATION;  Surgeon: Daneil Dolin, MD;  [facility];  Service: Endoscopy;  Laterality: N/A;  . MULTIPLE EXTRACTIONS WITH ALVEOLOPLASTY N/A 07/08/2014   Procedure: MULTIPLE EXTRACION WITH ALVEOLOPLASTY with EXCISION LESION RIGHT SIDE OF TONGUE;  Surgeon: Gae Bon, DDS;  Location: [facility];  Service: Oral Surgery;  Laterality: N/A;  . PORTACATH PLACEMENT Right 07/30/2016   Procedure: INSERTION PORT-A-CATH;  Surgeon: Vickie Epley, MD;  Location: [facility];  Service: Vascular;  Laterality: Right;  [name] VIDEO ASSISTED THORACOSCOPY (VATS)/EMPYEMA Right 03/28/2018   Procedure: VIDEO ASSISTED THORACOSCOPY (VATS)/EMPYEMA;  Surgeon: [name], MD;  Location: [place];  Service: Thoracic;  Laterality: Right;  [name] VIDEO BRONCHOSCOPY WITH ENDOBRONCHIAL ULTRASOUND N/A 07/19/2016   Procedure: VIDEO BRONCHOSCOPY WITH ENDOBRONCHIAL ULTRASOUND;  Surgeon: [name], MD;  Location: [facility] [facility];  Service: Thoracic;  Laterality: N/A;   Current Outpatient Medications  Medication Sig Dispense Refill  . albuterol (PROVENTIL) (2.5 MG/3ML) 0.083% nebulizer  solution Take 3 mLs (2.5 mg total) by nebulization every 6 (six) hours as needed for wheezing or shortness of breath (if unable to experience relief by using combivent inhaler). 75 mL 4  . ALPRAZolam (XANAX) 1 MG tablet Take 1 mg by mouth 2 (two) times daily as needed for anxiety or sleep (takes 1 tablet and bedtime for sleep and 1 dose during the day only if needed).     [name] amoxicillin-clavulanate (AUGMENTIN) 500-125 MG tablet Take 1 tablet (500 mg total) by mouth every 8 (eight) hours for 11 days. 33 tablet 0  .  apixaban (ELIQUIS) 5 MG TABS tablet Take 1 tablet (5 mg total) by mouth 2 (two) times daily. 60 tablet 2  . Artificial Tear Solution (SOOTHE XP) SOLN Apply 2 drops to eye daily.    Huey Bienenstock (TECENTRIQ IV) Inject into the vein. Every 3 weeks    . escitalopram (LEXAPRO) 20 MG tablet Take 20 mg by mouth daily.     [name] gabapentin (NEURONTIN) 600 MG tablet Take 600 mg by mouth 2 (two) times daily.     [name] guaiFENesin (MUCINEX) 600 MG 12 hr tablet Take 600 mg by mouth 2 (two) times daily as needed (for congestion.).    [name] Ipratropium-Albuterol (COMBIVENT) 20-100 MCG/ACT AERS respimat Inhale 1 puff into the lungs every 6 (six) hours as needed for wheezing or shortness of breath. 1 Inhaler 3  . lactulose (CHRONULAC) 10 GM/15ML solution Take 15 mLs (10 g total) by mouth 3 (three) times daily as needed for mild constipation or moderate constipation. 240 mL 0  . loperamide (IMODIUM) 2 MG capsule Take 1 capsule (2 mg total) by mouth as needed for diarrhea or loose stools. 20 capsule 0  . methadone (DOLOPHINE) 10 MG tablet Take 20 mg by mouth 5 (five) times daily. Takes 2 tablets 5 times daily    . mirtazapine (REMERON) 15 MG tablet Take 1 tablet by mouth at bedtime.    . Multiple Vitamin (MULTIVITAMIN WITH MINERALS) TABS tablet Take 1 tablet by mouth daily. Centrum    . ondansetron (ZOFRAN) 8 MG tablet TAKE 1 TABLET BY MOUTH EVERY 8 HOURS AS NEEDED FOR NAUSEA AND VOMITING. 60 tablet 1  . pantoprazole (PROTONIX) 40 MG tablet TAKE (1) TABLET BY MOUTH TWICE DAILY. 60 tablet 0  . potassium chloride SA (K-DUR,KLOR-CON) 20 MEQ tablet Take 1 tablet (20 mEq total) by mouth daily. 60 tablet 1  . saccharomyces boulardii (FLORASTOR) 250 MG capsule Take 1 capsule (250 mg total) by mouth 2 (two) times daily for 24 days. 48 capsule 0  . tiZANidine (ZANAFLEX) 4 MG tablet Take 4 mg by mouth every 8 (eight) hours as needed for muscle spasms.     . varenicline (CHANTIX) 0.5 MG tablet TAKE (2) TABLETS BY MOUTH TWICE DAILY. 112  tablet 0   No current facility-administered medications for this visit.    Facility-Administered Medications Ordered in Other Visits  Medication Dose Route Frequency Provider Last Rate Last Dose  . 0.9 %  sodium chloride infusion   Intravenous Continuous Baird Cancer, PA-C 10 mL/hr at 01/13/17 1330      Physical Exam BP 108/70 (BP Location: Right Arm, Patient Position: Sitting, Cuff Size: Normal)   Pulse (!) 122   Resp 18   Ht 6' (1.829 m)   Wt 210 lb (95.3 kg)   SpO2 96% Comment: ON 3L 02  BMI 28.48 kg/m  Temp 60.52 57 year old man appears ill but no acute distress Alert and oriented  x3 with no focal deficits No cervical or supra clavicular adenopathy Cardiac tachycardic Lungs diminished breath sounds on right Incision with large area of erythema surrounding incisions, staples still in place, no drainage, large seroma.  Diagnostic Tests: CHEST - 2 VIEW  COMPARISON:  Chest x-rays dated 04/08/2018 and 04/07/2018. Chest CT dated 03/23/2018.  FINDINGS: Compared to the recent chest x-rays, there is an increasing collection of air at the RIGHT lung base, with associated air-fluid levels compatible with the given history of empyema, presumably reaccumulating. There is also increasing subcutaneous emphysema over the RIGHT lateral chest wall, which may indicate associated fistulous connection.  LEFT lung is clear. Heart size and mediastinal contours are grossly stable. RIGHT chest wall Port-A-Cath is stable in position.  IMPRESSION: 1. Increased collection of air at the RIGHT lung base compared to most recent chest x-ray of 04/08/2018, now measuring approximately 8.6 cm greatest dimension, with associated air-fluid level, compatible with the given history of empyema, presumably interval worsening/reaccumulation. Consider follow-up chest CT for further characterization. 2. Also, increased subcutaneous emphysema over the RIGHT lateral chest wall. If no recent  interventional procedure, this raises the possibility of underlying fistulous connection. Again, consider chest CT for further characterization.  These results will be called to the ordering clinician or representative by the Radiologist Assistant, and communication documented in the PACS or zVision Dashboard.   Electronically Signed   By: Franki Cabot M.D.   On: 04/18/2018 16:58 I personally reviewed the chest x-ray images and concur with the findings noted above  Impression: [name] is a 57 year old gentleman with a history of advanced lung cancer treated with chemotherapy and radiation.  He recently had pneumonia with empyema.  He underwent right VATS for decortication about 3 weeks ago.  He initially had an air leak but that resolved.  We were finally able to get his tubes out and discharge him a week ago.  He returns today with fever and tachycardia although he is not symptomatic from either.  He has a wound infection.  His chest x-ray shows a hydropneumothorax at the right base and there also is evidence of an air-fluid level in his incision.  He needs readmission to the hospital and initiation of intravenous antibiotics.  He will need surgical reexploration either tomorrow afternoon or Thursday.  Will need to open the chest wound and place new chest tubes.  He may ultimately require a [place] procedure.  I discussed the proposed procedure of incision and drainage of right chest incision and chest tube placement with Mr. Parish.  He understands the indications, risks, benefits, and alternatives.  He understands the risks include, but not limited to death, MI, DVT, PE, bleeding, possible need for transfusion, infection, as well as possibility of other unforeseeable complications.  He also understands that multiple procedures may be necessary.  He may require prolonged hospitalization.  We will stop his Eliquis.  It would be ideal to be able to wait until Thursday for Eliquis to be out  of his system but I am not sure we will be able to clinically.  I will reassess that tomorrow  Plan: Admit Telemetry IV antibiotics with vancomycin and Zosyn Stop Eliquis IV hydration   [name], MD [facility] [phone]

## 2018-04-19 ENCOUNTER — Inpatient Hospital Stay (HOSPITAL_COMMUNITY): Payer: Medicare Other | Admitting: Certified Registered"

## 2018-04-19 ENCOUNTER — Inpatient Hospital Stay (HOSPITAL_COMMUNITY): Payer: Medicare Other

## 2018-04-19 ENCOUNTER — Encounter (HOSPITAL_COMMUNITY)
Admission: AD | Disposition: A | Discharge status: Home Health | Payer: Self-pay | Source: Home / Self Care | Attending: Thoracic Surgery (Cardiothoracic Vascular Surgery)

## 2018-04-19 ENCOUNTER — Inpatient Hospital Stay (HOSPITAL_COMMUNITY)
Admission: RE | Admit: 2018-04-19 | Payer: Medicare Other | Source: Ambulatory Visit | Admitting: Thoracic Surgery (Cardiothoracic Vascular Surgery)

## 2018-04-19 ENCOUNTER — Encounter (HOSPITAL_COMMUNITY): Payer: Self-pay | Admitting: Certified Registered"

## 2018-04-19 DIAGNOSIS — I9789 Other postprocedural complications and disorders of the circulatory system, not elsewhere classified: Secondary | ICD-10-CM

## 2018-04-19 HISTORY — PX: I&D EXTREMITY: SHX5045

## 2018-04-19 HISTORY — PX: CHEST TUBE INSERTION: SHX231

## 2018-04-19 HISTORY — PX: THORACOTOMY: SHX5074

## 2018-04-19 HISTORY — PX: APPLICATION OF WOUND VAC: SHX5189

## 2018-04-19 SURGERY — IRRIGATION AND DEBRIDEMENT EXTREMITY
Anesthesia: General | Site: Chest | Laterality: Right

## 2018-04-19 MED ORDER — LACTATED RINGERS IV SOLN
INTRAVENOUS | Status: DC | PRN
  Administered 2018-04-19 (×2): via INTRAVENOUS

## 2018-04-19 MED ORDER — SODIUM CHLORIDE 0.9% FLUSH: 9.0000 mL | INTRAVENOUS | Status: DC | PRN

## 2018-04-19 MED ORDER — PROMETHAZINE HCL 25 MG/ML IJ SOLN: 6.2500 mg | INTRAMUSCULAR | Status: DC | PRN

## 2018-04-19 MED ORDER — FENTANYL 40 MCG/ML IV SOLN
INTRAVENOUS | Status: DC
  Administered 2018-04-19: 1000 ug via INTRAVENOUS
  Administered 2018-04-20: 40 ug via INTRAVENOUS
  Administered 2018-04-20: 130 ug via INTRAVENOUS
  Administered 2018-04-20: 140 ug via INTRAVENOUS
  Administered 2018-04-21: 70 ug via INTRAVENOUS
  Administered 2018-04-21: 10 ug via INTRAVENOUS
  Administered 2018-04-21: 1000 ug via INTRAVENOUS
  Administered 2018-04-21: 10 ug via INTRAVENOUS
  Administered 2018-04-21: 0 ug via INTRAVENOUS
  Administered 2018-04-22: 30 ug via INTRAVENOUS
  Administered 2018-04-22: 0 ug via INTRAVENOUS
  Administered 2018-04-22: 90 ug via INTRAVENOUS
  Administered 2018-04-23: 80 ug via INTRAVENOUS
  Administered 2018-04-23: 100 ug via INTRAVENOUS
  Administered 2018-04-23: 50 ug via INTRAVENOUS
  Administered 2018-04-23: 1000 ug via INTRAVENOUS
  Administered 2018-04-23: 80 ug via INTRAVENOUS
  Administered 2018-04-24: 10 ug via INTRAVENOUS
  Administered 2018-04-24: 1000 ug via INTRAVENOUS
  Administered 2018-04-24: 70 ug via INTRAVENOUS
  Administered 2018-04-24: 40 ug via INTRAVENOUS
  Administered 2018-04-25: 20 ug via INTRAVENOUS
  Administered 2018-04-25: 10 ug via INTRAVENOUS
  Administered 2018-04-25: 90 ug via INTRAVENOUS
  Administered 2018-04-25: 80 ug via INTRAVENOUS
  Administered 2018-04-25: 40 ug via INTRAVENOUS
  Administered 2018-04-25 – 2018-04-26 (×3): 30 ug via INTRAVENOUS
  Filled 2018-04-19 (×5): qty 25

## 2018-04-19 MED ORDER — DEXAMETHASONE SODIUM PHOSPHATE 10 MG/ML IJ SOLN
INTRAMUSCULAR | Status: DC | PRN
  Administered 2018-04-19: 10 mg via INTRAVENOUS

## 2018-04-19 MED ORDER — DIPHENHYDRAMINE HCL 50 MG/ML IJ SOLN
12.5000 mg | Freq: Four times a day (QID) | INTRAMUSCULAR | Status: DC | PRN
Start: 2018-04-19 — End: 2018-04-26

## 2018-04-19 MED ORDER — DIPHENHYDRAMINE HCL 12.5 MG/5ML PO ELIX
12.5000 mg | ORAL_SOLUTION | Freq: Four times a day (QID) | ORAL | Status: DC | PRN
  Filled 2018-04-19: qty 5

## 2018-04-19 MED ORDER — PROPOFOL 10 MG/ML IV BOLUS
INTRAVENOUS | Status: AC
  Filled 2018-04-19: qty 20

## 2018-04-19 MED ORDER — FENTANYL CITRATE (PF) 250 MCG/5ML IJ SOLN
INTRAMUSCULAR | Status: AC
  Filled 2018-04-19: qty 5

## 2018-04-19 MED ORDER — LIDOCAINE 2% (20 MG/ML) 5 ML SYRINGE
INTRAMUSCULAR | Status: AC
  Filled 2018-04-19: qty 5

## 2018-04-19 MED ORDER — BUPIVACAINE LIPOSOME 1.3 % IJ SUSP
20.0000 mL | INTRAMUSCULAR | Status: DC
  Filled 2018-04-19: qty 20

## 2018-04-19 MED ORDER — ONDANSETRON HCL 4 MG/2ML IJ SOLN
INTRAMUSCULAR | Status: DC | PRN
  Administered 2018-04-19: 4 mg via INTRAVENOUS

## 2018-04-19 MED ORDER — LIDOCAINE 2% (20 MG/ML) 5 ML SYRINGE
INTRAMUSCULAR | Status: DC | PRN
  Administered 2018-04-19: 80 mg via INTRAVENOUS

## 2018-04-19 MED ORDER — NALOXONE HCL 0.4 MG/ML IJ SOLN: 0.4000 mg | INTRAMUSCULAR | Status: DC | PRN

## 2018-04-19 MED ORDER — ROCURONIUM BROMIDE 50 MG/5ML IV SOLN
INTRAVENOUS | Status: AC
  Filled 2018-04-19: qty 1

## 2018-04-19 MED ORDER — MIDAZOLAM HCL 2 MG/2ML IJ SOLN
INTRAMUSCULAR | Status: DC | PRN
  Administered 2018-04-19: 2 mg via INTRAVENOUS

## 2018-04-19 MED ORDER — PANTOPRAZOLE SODIUM 40 MG PO TBEC: 40.0000 mg | DELAYED_RELEASE_TABLET | Freq: Every day | ORAL | Status: DC

## 2018-04-19 MED ORDER — ROCURONIUM BROMIDE 10 MG/ML (PF) SYRINGE
PREFILLED_SYRINGE | INTRAVENOUS | Status: DC | PRN
  Administered 2018-04-19: 50 mg via INTRAVENOUS
  Administered 2018-04-19: 40 mg via INTRAVENOUS
  Administered 2018-04-19: 10 mg via INTRAVENOUS

## 2018-04-19 MED ORDER — FENTANYL CITRATE (PF) 250 MCG/5ML IJ SOLN
INTRAMUSCULAR | Status: DC | PRN
  Administered 2018-04-19: 150 ug via INTRAVENOUS

## 2018-04-19 MED ORDER — PHENYLEPHRINE 40 MCG/ML (10ML) SYRINGE FOR IV PUSH (FOR BLOOD PRESSURE SUPPORT)
PREFILLED_SYRINGE | INTRAVENOUS | Status: DC | PRN
  Administered 2018-04-19 (×4): 80 ug via INTRAVENOUS

## 2018-04-19 MED ORDER — MIDAZOLAM HCL 2 MG/2ML IJ SOLN
INTRAMUSCULAR | Status: AC
  Filled 2018-04-19: qty 2

## 2018-04-19 MED ORDER — SUGAMMADEX SODIUM 200 MG/2ML IV SOLN
INTRAVENOUS | Status: DC | PRN
  Administered 2018-04-19: 500 mg via INTRAVENOUS

## 2018-04-19 MED ORDER — HYDROMORPHONE HCL 2 MG/ML IJ SOLN: 0.2500 mg | INTRAMUSCULAR | Status: DC | PRN

## 2018-04-19 MED ORDER — PROPOFOL 10 MG/ML IV BOLUS
INTRAVENOUS | Status: DC | PRN
  Administered 2018-04-19: 200 mg via INTRAVENOUS

## 2018-04-19 MED ORDER — ONDANSETRON HCL 4 MG/2ML IJ SOLN: 4.0000 mg | Freq: Four times a day (QID) | INTRAMUSCULAR | Status: DC | PRN

## 2018-04-19 MED ORDER — KCL IN DEXTROSE-NACL 20-5-0.45 MEQ/L-%-% IV SOLN
INTRAVENOUS | Status: DC
  Administered 2018-04-19 – 2018-04-20 (×3): via INTRAVENOUS
  Filled 2018-04-19 (×3): qty 1000

## 2018-04-19 SURGICAL SUPPLY — 46 items
BANDAGE ACE 4X5 VEL STRL LF (GAUZE/BANDAGES/DRESSINGS) IMPLANT
BANDAGE ACE 6X5 VEL STRL LF (GAUZE/BANDAGES/DRESSINGS) IMPLANT
BLADE SURG 15 STRL LF DISP TIS (BLADE) ×3 IMPLANT
BLADE SURG 15 STRL SS (BLADE) ×2
BNDG GAUZE ELAST 4 BULKY (GAUZE/BANDAGES/DRESSINGS) IMPLANT
CANISTER SUCT 3000ML PPV (MISCELLANEOUS) ×5 IMPLANT
CLIP VESOCCLUDE SM WIDE 24/CT (CLIP) ×5 IMPLANT
CONT SPEC 4OZ CLIKSEAL STRL BL (MISCELLANEOUS) ×10 IMPLANT
COVER SURGICAL LIGHT HANDLE (MISCELLANEOUS) ×10 IMPLANT
DRSG COVADERM 4X14 (GAUZE/BANDAGES/DRESSINGS) ×5 IMPLANT
DRSG VAC ATS LRG SENSATRAC (GAUZE/BANDAGES/DRESSINGS) ×5 IMPLANT
ELECT BLADE 6.5 EXT (BLADE) ×5 IMPLANT
ELECT REM PT RETURN 9FT ADLT (ELECTROSURGICAL) ×5
ELECTRODE REM PT RTRN 9FT ADLT (ELECTROSURGICAL) ×3 IMPLANT
GAUZE SPONGE 4X4 12PLY STRL (GAUZE/BANDAGES/DRESSINGS) ×5 IMPLANT
GAUZE SPONGE 4X4 12PLY STRL LF (GAUZE/BANDAGES/DRESSINGS) ×5 IMPLANT
GLOVE BIOGEL PI IND STRL 6.5 (GLOVE) ×3 IMPLANT
GLOVE BIOGEL PI INDICATOR 6.5 (GLOVE) ×2
GLOVE SURG SIGNA 7.5 PF LTX (GLOVE) ×5 IMPLANT
GOWN STRL REUS W/ TWL LRG LVL3 (GOWN DISPOSABLE) ×6 IMPLANT
GOWN STRL REUS W/ TWL XL LVL3 (GOWN DISPOSABLE) ×3 IMPLANT
GOWN STRL REUS W/TWL LRG LVL3 (GOWN DISPOSABLE) ×4
GOWN STRL REUS W/TWL XL LVL3 (GOWN DISPOSABLE) ×2
KIT BASIN OR (CUSTOM PROCEDURE TRAY) ×5 IMPLANT
KIT REMOVER STAPLE SKIN (MISCELLANEOUS) ×5 IMPLANT
KIT TURNOVER KIT B (KITS) ×5 IMPLANT
NS IRRIG 1000ML POUR BTL (IV SOLUTION) ×5 IMPLANT
PACK GENERAL/GYN (CUSTOM PROCEDURE TRAY) ×5 IMPLANT
PACK UNIVERSAL I (CUSTOM PROCEDURE TRAY) ×5 IMPLANT
PAD ARMBOARD 7.5X6 YLW CONV (MISCELLANEOUS) ×10 IMPLANT
PATCH CORMATRIX 4CMX7CM (Prosthesis & Implant Heart) ×5 IMPLANT
SOLUTION ANTI FOG 6CC (MISCELLANEOUS) ×5 IMPLANT
SPONGE INTESTINAL PEANUT (DISPOSABLE) ×10 IMPLANT
SPONGE TONSIL 1 RF SGL (DISPOSABLE) ×5 IMPLANT
STAPLER VISISTAT 35W (STAPLE) IMPLANT
SUT SILK  1 MH (SUTURE) ×2
SUT SILK 1 MH (SUTURE) ×3 IMPLANT
SUT VIC AB 2-0 CTX 36 (SUTURE) IMPLANT
SUT VIC AB 3-0 SH 27 (SUTURE) ×4
SUT VIC AB 3-0 SH 27X BRD (SUTURE) ×6 IMPLANT
SUT VIC AB 3-0 X1 27 (SUTURE) ×5 IMPLANT
TAPE CLOTH SURG 6X10 WHT LF (GAUZE/BANDAGES/DRESSINGS) ×5 IMPLANT
TOWEL GREEN STERILE (TOWEL DISPOSABLE) ×5 IMPLANT
TOWEL GREEN STERILE FF (TOWEL DISPOSABLE) ×5 IMPLANT
TROCAR BLADELESS 5M (ENDOMECHANICALS) ×5 IMPLANT
WATER STERILE IRR 1000ML POUR (IV SOLUTION) ×5 IMPLANT

## 2018-04-19 NOTE — Transfer of Care (Signed)
Immediate Anesthesia Transfer of Care Note  Patient: Erik [name]  Procedure(s) Performed: IRRIGATION AND DEBRIDEMENT RIGHT CHEST WOUND (Right ) APPLICATION OF WOUND VAC (Chest) CHEST TUBE INSERTION (Right Chest) THORACOTOMY MAJOR (Chest) CHEST TUBE INSERTION (Right )  Patient Location: PACU  Anesthesia Type:General  Level of Consciousness: awake, alert  and oriented  Airway & Oxygen Therapy: Patient Spontanous Breathing and Patient connected to face mask oxygen  Post-op Assessment: Report given to RN and Post -op Vital signs reviewed and stable  Post vital signs: Reviewed and stable  Last Vitals:  Vitals Value Taken Time  BP 143/95 04/19/2018  6:00 PM  Temp 36.2 C 04/19/2018  6:00 PM  Pulse 103 04/19/2018  6:01 PM  Resp 18 04/19/2018  6:01 PM  SpO2 100 % 04/19/2018  6:01 PM  Vitals shown include unvalidated device data.  Last Pain:  Vitals:   04/19/18 1409  TempSrc: Oral  PainSc:          Complications: No apparent anesthesia complications

## 2018-04-19 NOTE — Brief Op Note (Addendum)
04/18/2018 - 04/19/2018  5:36 PM  PATIENT:  Erik [name]  57 y.o. male  PRE-OPERATIVE DIAGNOSIS:  WOUND INFECTION  POST-OPERATIVE DIAGNOSIS:  RIGHT EMPYEMA WITH WOUND INFECTION  PROCEDURE:  Procedure(s): IRRIGATION AND DEBRIDEMENT RIGHT CHEST WOUND (Right) CHEST TUBE INSERTION (Right) APPLICATION OF WOUND VAC CHEST TUBE INSERTION (Right)  SURGEON:  Surgeon(s) and Role:     * [name], MD - Primary   PHYSICIAN ASSISTANT: WAYNE GOLD PA-C  ANESTHESIA:   general  EBL:  40 mL   BLOOD ADMINISTERED:none  DRAINS: (1 34 f ) Blake drain(s) in the RIGHT HEMITHORAX   LOCAL MEDICATIONS USED:  NONE  SPECIMEN:  Source of Specimen:  EMPYEMA FOR CULTURE  DISPOSITION OF SPECIMEN:  MICRO  COUNTS:  YES  TOURNIQUET:  * No tourniquets in log *  DICTATION: .Other Dictation: Dictation Number [id number]  PLAN OF CARE: Admit to inpatient   PATIENT DISPOSITION:  PACU - hemodynamically stable.   Delay start of Pharmacological VTE agent (>24hrs) due to surgical blood loss or risk of bleeding: yes

## 2018-04-19 NOTE — Interval H&P Note (Signed)
History and Physical Interval Note:  04/19/2018 3:05 PM  Erik [name]  has presented today for surgery, with the diagnosis of WOUND INFECTION  The various methods of treatment have been discussed with the patient and family. After consideration of risks, benefits and other options for treatment, the patient has consented to  Procedure(s): IRRIGATION AND DEBRIDEMENT RIGHT CHEST WOUND (Right) CHEST TUBE INSERTION (Right) as a surgical intervention .  The patient's history has been reviewed, patient examined, no change in status, stable for surgery.  I have reviewed the patient's chart and labs.  Questions were answered to the patient's satisfaction.     [name]

## 2018-04-19 NOTE — Anesthesia Preprocedure Evaluation (Addendum)
Anesthesia Evaluation  Patient identified by MRN, date of birth, ID band Patient awake    Reviewed: Allergy & Precautions, NPO status , Patient's Chart, lab work & pertinent test results  Airway Mallampati: II  TM Distance: >3 FB Neck ROM: Full    Dental  (+) Dental Advisory Given, Edentulous Upper, Missing   Pulmonary shortness of breath, asthma , pneumonia, COPD, Current Smoker, former smoker,  Pneumonia with empyema and lung CA and chronic pleural effusion   breath sounds clear to auscultation       Cardiovascular hypertension, Pt. on medications  Rhythm:Regular Rate:Normal  03/2018: Left ventricle: Poor acousitic windows No apical window Overall  LVEF is probably mildly depressed Recomm limited echo with  contrast to see if helps in evaluation. The cavity size was  normal. Wall thickness was increased in a pattern of mild LVH. - Left atrium: The atrium was mildly dilated.   Neuro/Psych Anxiety Depression negative neurological ROS     GI/Hepatic Neg liver ROS, GERD  Medicated,  Endo/Other  negative endocrine ROS  Renal/GU negative Renal ROS     Musculoskeletal  (+) Arthritis ,   Abdominal   Peds  Hematology  (+) anemia ,   Anesthesia Other Findings   Reproductive/Obstetrics                             Lab Results  Component Value Date   WBC 10.2 04/18/2018   HGB 9.8 (L) 04/18/2018   HCT 31.5 (L) 04/18/2018   MCV 91.6 04/18/2018   PLT 274 04/18/2018   Lab Results  Component Value Date   CREATININE 1.37 (H) 04/18/2018   BUN 13 04/18/2018   NA 136 04/18/2018   K 3.4 (L) 04/18/2018   CL 94 (L) 04/18/2018   CO2 32 04/18/2018    Anesthesia Physical  Anesthesia Plan  ASA: III  Anesthesia Plan: General   Post-op Pain Management:    Induction: Intravenous  PONV Risk Score and Plan: 1 and Ondansetron, Dexamethasone and Treatment may vary due to age or medical  condition  Airway Management Planned: Double Lumen EBT  Additional Equipment: Arterial line  Intra-op Plan:   Post-operative Plan: Extubation in OR  Informed Consent: I have reviewed the patients History and Physical, chart, labs and discussed the procedure including the risks, benefits and alternatives for the proposed anesthesia with the patient or authorized representative who has indicated his/her understanding and acceptance.   Dental advisory given  Plan Discussed with: CRNA  Anesthesia Plan Comments:        Anesthesia Quick Evaluation

## 2018-04-19 NOTE — Care Management Note (Signed)
Case Management Note Marvetta Gibbons RN, BSN Unit 4E-Case Manager [phone]  Patient Details  Name: Erik [name] MRN: [id number] Date of Birth: [birth year]  Subjective/Objective:   Pt admitted with chest wall infection- plan for I&D                 Action/Plan: PTA pt lived at home- has home 02 baseline with [facility]- pt was sent home on last discharge with [facility] for mini express needs- May need [facility] RN again for wound care- CM to follow for transition of care needs  Expected Discharge Date:                  Expected Discharge Plan:  [facility]  In-House Referral:     Discharge planning Services  CM Consult  Post Acute Care Choice:    Choice offered to:  Patient  DME Arranged:    DME Agency:     HH Arranged:    [place] Agency:  [place]  Status of Service:  In process, will continue to follow  If discussed at Long Length of Stay Meetings, dates discussed:    Additional Comments:  Dawayne Patricia, RN 04/19/2018, 11:32 AM

## 2018-04-19 NOTE — Progress Notes (Signed)
Pt notified of time change to 8am

## 2018-04-19 NOTE — Anesthesia Procedure Notes (Signed)
Procedure Name: Intubation Date/Time: 04/19/2018 5:06 PM Performed by: Teressa Lower., CRNA Pre-anesthesia Checklist: Patient identified, Emergency Drugs available, Suction available and Patient being monitored Patient Re-evaluated:Patient Re-evaluated prior to induction Oxygen Delivery Method: Circle system utilized Preoxygenation: Pre-oxygenation with 100% oxygen Induction Type: IV induction Ventilation: Mask ventilation without difficulty and Oral airway inserted - appropriate to patient size Laryngoscope Size: Mac and 3 Grade View: Grade I Tube type: Oral Endobronchial tube: Double lumen EBT, Left and EBT position confirmed by auscultation and 41 Fr Number of attempts: 1 Airway Equipment and Method: Stylet and Oral airway Placement Confirmation: ETT inserted through vocal cords under direct vision,  positive ETCO2 and breath sounds checked- equal and bilateral Secured at: 30 cm Tube secured with: Tape Dental Injury: Teeth and Oropharynx as per pre-operative assessment

## 2018-04-20 ENCOUNTER — Other Ambulatory Visit: Payer: Self-pay

## 2018-04-20 ENCOUNTER — Inpatient Hospital Stay (HOSPITAL_COMMUNITY): Payer: Medicare Other

## 2018-04-20 ENCOUNTER — Encounter (HOSPITAL_COMMUNITY): Payer: Self-pay | Admitting: Thoracic Surgery (Cardiothoracic Vascular Surgery)

## 2018-04-20 LAB — BASIC METABOLIC PANEL
ANION GAP: 8 (ref 5–15)
BUN: 12 mg/dL (ref 6–20)
CALCIUM: 7.7 mg/dL — AB (ref 8.9–10.3)
CHLORIDE: 99 mmol/L — AB (ref 101–111)
CO2: 31 mmol/L (ref 22–32)
Creatinine, Ser: 1.36 mg/dL — ABNORMAL HIGH (ref 0.61–1.24)
GFR calc non Af Amer: 57 mL/min — ABNORMAL LOW (ref >60)
GLUCOSE: 206 mg/dL — AB (ref 65–99)
POTASSIUM: 3.7 mmol/L (ref 3.5–5.1)
Sodium: 138 mmol/L (ref 135–145)

## 2018-04-20 LAB — CBC
HEMATOCRIT: 31.6 % — AB (ref 39.0–52.0)
HEMOGLOBIN: 9.7 g/dL — AB (ref 13.0–17.0)
MCH: 28.5 pg (ref 26.0–34.0)
MCHC: 30.7 g/dL (ref 30.0–36.0)
MCV: 92.9 fL (ref 78.0–100.0)
Platelets: 308 10*3/uL (ref 150–400)
RBC: 3.4 MIL/uL — AB (ref 4.22–5.81)
RDW: 15.9 % — AB (ref 11.5–15.5)
WBC: 9.9 10*3/uL (ref 4.0–10.5)

## 2018-04-20 LAB — TYPE AND SCREEN
ABO/RH(D): O NEG
Antibody Screen: NEGATIVE

## 2018-04-20 LAB — VANCOMYCIN, TROUGH: Vancomycin Tr: 27 ug/mL (ref 15–20)

## 2018-04-20 MED ORDER — ORAL CARE MOUTH RINSE
15.0000 mL | Freq: Two times a day (BID) | OROMUCOSAL | Status: DC
  Administered 2018-04-22 – 2018-04-28 (×5): 15 mL via OROMUCOSAL

## 2018-04-20 MED ORDER — ENOXAPARIN SODIUM 40 MG/0.4ML ~~LOC~~ SOLN: 40.0000 mg | SUBCUTANEOUS | Status: DC

## 2018-04-20 NOTE — Progress Notes (Signed)
Received call from lab with critical vancomycin trough of 27. Called pharmacy to advise of value. Told Erik [name] that next dose is currently running and asked if we should stop it. We estimated that about one third of the dose is remaining based on my administration time of 22:12. She advised to go ahead and stop dose now.

## 2018-04-20 NOTE — Progress Notes (Addendum)
[address]       [place] [address]             [phone]      1 Day Post-Op Procedure(s) (LRB): IRRIGATION AND DEBRIDEMENT RIGHT CHEST WOUND (Right) CHEST TUBE INSERTION (Right) APPLICATION OF WOUND VAC CHEST TUBE INSERTION (Right) THORACOTOMY MAJOR Subjective: Pain controlled, mildly SOB  Objective: Vital signs in last 24 hours: Temp:  [97.2 F (36.2 C)-98.5 F (36.9 C)] 97.6 F (36.4 C) (05/16 0504) Pulse Rate:  [79-110] 79 (05/16 0504) Cardiac Rhythm: Normal sinus rhythm (05/16 0719) Resp:  [9-20] 14 (05/16 0808) BP: (93-143)/(53-95) 93/53 (05/16 0504) SpO2:  [93 %-100 %] 100 % (05/16 0808) Arterial Line BP: (105-174)/(52-85) 105/52 (05/16 0000) Weight:  [98.2 kg (216 lb 6.4 oz)] 98.2 kg (216 lb 6.4 oz) (05/16 0504)  Hemodynamic parameters for last 24 hours:    Intake/Output from previous day: 05/15 0701 - 05/16 0700 In: 1775 [I.V.:1700] Out: 2480 [Urine:1990; Drains:50; Blood:40; Chest Tube:400] Intake/Output this shift: No intake/output data recorded.  General appearance: alert, cooperative and no distress Heart: regular rate and rhythm Lungs: dim on right Abdomen: benign Extremities: no edema or calf tenderness Wound: VAC in place, appears to be functioning well  Lab Results: Recent Labs    04/17/18 1549 04/18/18 1941  WBC 9.2 10.2  HGB 10.3* 9.8*  HCT 33.2* 31.5*  PLT 306 274   BMET:  Recent Labs    04/17/18 1549 04/18/18 1941  NA 136 136  K 3.3* 3.4*  CL 93* 94*  CO2 35* 32  GLUCOSE 119* 106*  BUN 15 13  CREATININE 1.47* 1.37*  CALCIUM 8.3* 8.2*    PT/INR:  Recent Labs    04/18/18 1941  LABPROT 16.7*  INR 1.37   ABG    Component Value Date/Time   PHART 7.460 (H) 03/29/2018 0308   HCO3 34.1 (H) 03/29/2018 0308   TCO2 36 (H) 03/29/2018 0308   ACIDBASEDEF 4.0 (H) 07/19/2016 1407   O2SAT 97.0 03/29/2018 0308   CBG (last 3)  No results for input(s): GLUCAP in the last 72 hours.  Meds Scheduled  Meds: . escitalopram  20 mg Oral Daily  . fentaNYL   Intravenous Q4H  . gabapentin  600 mg Oral BID  . methadone  20 mg Oral 5 X Daily  . mirtazapine  15 mg Oral QHS  . pantoprazole  40 mg Oral BID  . polyvinyl alcohol  1 drop Both Eyes Daily  . sodium chloride flush  3 mL Intravenous Q12H  . varenicline  0.5 mg Oral Daily   Continuous Infusions: . sodium chloride    . dextrose 5 % and 0.45 % NaCl with KCl 20 mEq/L 100 mL/hr at 04/19/18 2049  . piperacillin-tazobactam (ZOSYN)  IV 3.375 g (04/20/18 0730)  . vancomycin Stopped (04/19/18 2217)   PRN Meds:.sodium chloride, acetaminophen **OR** acetaminophen, ALPRAZolam, diphenhydrAMINE **OR** diphenhydrAMINE, levalbuterol, naloxone **AND** sodium chloride flush, ondansetron **OR** ondansetron (ZOFRAN) IV, sodium chloride flush, traMADol  Xrays Dg Chest 2 View  Addendum Date: 04/18/2018   ADDENDUM REPORT: 04/18/2018 17:20 ADDENDUM: I called these results by telephone on 04/18/2018 at 5:19 pm to Dr. Servando Snare , who verbally acknowledged these results and will relay the information to Dr. Roxan Hockey. Electronically Signed   By: Franki Cabot M.D.   On: 04/18/2018 17:20   Result Date: 04/18/2018 CLINICAL DATA:  Pt c/o empyema of right lung since 03/28/18. Pt complains of tiredness. Hx of Arnold-Chiari syndrome,  asthma, COPD, HTN, pneumonia, pulmonary embolus, right side lung caner 2017. EXAM: CHEST - 2 VIEW COMPARISON:  Chest x-rays dated 04/08/2018 and 04/07/2018. Chest CT dated 03/23/2018. FINDINGS: Compared to the recent chest x-rays, there is an increasing collection of air at the RIGHT lung base, with associated air-fluid levels compatible with the given history of empyema, presumably reaccumulating. There is also increasing subcutaneous emphysema over the RIGHT lateral chest wall, which may indicate associated fistulous connection. LEFT lung is clear. Heart size and mediastinal contours are grossly stable. RIGHT chest wall Port-A-Cath is stable  in position. IMPRESSION: 1. Increased collection of air at the RIGHT lung base compared to most recent chest x-ray of 04/08/2018, now measuring approximately 8.6 cm greatest dimension, with associated air-fluid level, compatible with the given history of empyema, presumably interval worsening/reaccumulation. Consider follow-up chest CT for further characterization. 2. Also, increased subcutaneous emphysema over the RIGHT lateral chest wall. If no recent interventional procedure, this raises the possibility of underlying fistulous connection. Again, consider chest CT for further characterization. These results will be called to the ordering clinician or representative by the Radiologist Assistant, and communication documented in the PACS or zVision Dashboard. Electronically Signed: By: Franki Cabot M.D. On: 04/18/2018 16:58   Dg Chest Port 1 View  Result Date: 04/19/2018 CLINICAL DATA:  Surgery follow-up. EXAM: PORTABLE CHEST 1 VIEW COMPARISON:  Plain film of 04/18/2018. FINDINGS: Status post surgical wound debridement and chest tube insertion since yesterday's exam. Chest tube is seen at the RIGHT lung base with tip directed towards the RIGHT hilum. The collection of air at the RIGHT lung base has been resolved. There is now near complete opacification the RIGHT hemithorax, with associated volume loss. LEFT lung remains clear. Persistent subcutaneous emphysema overlying the RIGHT chest, an expected finding. IMPRESSION: RIGHT-sided chest tube in place status post interval surgical wound debridement. Associated interval resolution of the collection of air at the RIGHT lung base. Electronically Signed   By: Franki Cabot M.D.   On: 04/19/2018 21:06    Assessment/Plan: S/P Procedure(s) (LRB): IRRIGATION AND DEBRIDEMENT RIGHT CHEST WOUND (Right) CHEST TUBE INSERTION (Right) APPLICATION OF WOUND VAC CHEST TUBE INSERTION (Right) THORACOTOMY MAJOR  1 stable, hemodynamically stable 2 cont VAC and chest tube,  CXR with sub q air/ infiltrate that is fairly stable in appearance. CT without air leak 3 d/c aline and foley 4 decrease IVF rate 5 cont PCA 6 labs pending    LOS: 2 days    John Giovanni 04/20/2018 Patient seen and examined, agree with above VAC working well. No air leak from cT I discussed with Mr. Garriga returning to or tomorrow for [facility] change, possible rib resection (modified Clagett procedure)He is aware of the general nature of the procedure. We discussed the indications, risks, benefits and alternatives. He understands the risks include but are not limited to death, MI, DVT, PE, bleeding, infection, possible need for transfusion, as well as other unforeseeable complications.  He accepts the risks and agrees to proceed.  Revonda Standard Roxan Hockey, MD [facility] [phone]

## 2018-04-20 NOTE — Progress Notes (Signed)
Left radial a-line removed, per order. Pressure dressing applied.  Will continue to monitor.    Ara Kussmaul BSN, RN

## 2018-04-20 NOTE — H&P (View-Only) (Signed)
[address]       [place] [address]             [phone]      1 Day Post-Op Procedure(s) (LRB): IRRIGATION AND DEBRIDEMENT RIGHT CHEST WOUND (Right) CHEST TUBE INSERTION (Right) APPLICATION OF WOUND VAC CHEST TUBE INSERTION (Right) THORACOTOMY MAJOR Subjective: Pain controlled, mildly SOB  Objective: Vital signs in last 24 hours: Temp:  [97.2 F (36.2 C)-98.5 F (36.9 C)] 97.6 F (36.4 C) (05/16 0504) Pulse Rate:  [79-110] 79 (05/16 0504) Cardiac Rhythm: Normal sinus rhythm (05/16 0719) Resp:  [9-20] 14 (05/16 0808) BP: (93-143)/(53-95) 93/53 (05/16 0504) SpO2:  [93 %-100 %] 100 % (05/16 0808) Arterial Line BP: (105-174)/(52-85) 105/52 (05/16 0000) Weight:  [98.2 kg (216 lb 6.4 oz)] 98.2 kg (216 lb 6.4 oz) (05/16 0504)  Hemodynamic parameters for last 24 hours:    Intake/Output from previous day: 05/15 0701 - 05/16 0700 In: 1775 [I.V.:1700] Out: 2480 [Urine:1990; Drains:50; Blood:40; Chest Tube:400] Intake/Output this shift: No intake/output data recorded.  General appearance: alert, cooperative and no distress Heart: regular rate and rhythm Lungs: dim on right Abdomen: benign Extremities: no edema or calf tenderness Wound: VAC in place, appears to be functioning well  Lab Results: Recent Labs    04/17/18 1549 04/18/18 1941  WBC 9.2 10.2  HGB 10.3* 9.8*  HCT 33.2* 31.5*  PLT 306 274   BMET:  Recent Labs    04/17/18 1549 04/18/18 1941  NA 136 136  K 3.3* 3.4*  CL 93* 94*  CO2 35* 32  GLUCOSE 119* 106*  BUN 15 13  CREATININE 1.47* 1.37*  CALCIUM 8.3* 8.2*    PT/INR:  Recent Labs    04/18/18 1941  LABPROT 16.7*  INR 1.37   ABG    Component Value Date/Time   PHART 7.460 (H) 03/29/2018 0308   HCO3 34.1 (H) 03/29/2018 0308   TCO2 36 (H) 03/29/2018 0308   ACIDBASEDEF 4.0 (H) 07/19/2016 1407   O2SAT 97.0 03/29/2018 0308   CBG (last 3)  No results for input(s): GLUCAP in the last 72 hours.  Meds Scheduled  Meds: . escitalopram  20 mg Oral Daily  . fentaNYL   Intravenous Q4H  . gabapentin  600 mg Oral BID  . methadone  20 mg Oral 5 X Daily  . mirtazapine  15 mg Oral QHS  . pantoprazole  40 mg Oral BID  . polyvinyl alcohol  1 drop Both Eyes Daily  . sodium chloride flush  3 mL Intravenous Q12H  . varenicline  0.5 mg Oral Daily   Continuous Infusions: . sodium chloride    . dextrose 5 % and 0.45 % NaCl with KCl 20 mEq/L 100 mL/hr at 04/19/18 2049  . piperacillin-tazobactam (ZOSYN)  IV 3.375 g (04/20/18 0730)  . vancomycin Stopped (04/19/18 2217)   PRN Meds:.sodium chloride, acetaminophen **OR** acetaminophen, ALPRAZolam, diphenhydrAMINE **OR** diphenhydrAMINE, levalbuterol, naloxone **AND** sodium chloride flush, ondansetron **OR** ondansetron (ZOFRAN) IV, sodium chloride flush, traMADol  Xrays Dg Chest 2 View  Addendum Date: 04/18/2018   ADDENDUM REPORT: 04/18/2018 17:20 ADDENDUM: I called these results by telephone on 04/18/2018 at 5:19 pm to Dr. Servando Snare , who verbally acknowledged these results and will relay the information to Dr. Roxan Hockey. Electronically Signed   By: Franki Cabot M.D.   On: 04/18/2018 17:20   Result Date: 04/18/2018 CLINICAL DATA:  Pt c/o empyema of right lung since 03/28/18. Pt complains of tiredness. Hx of Arnold-Chiari syndrome,  asthma, COPD, HTN, pneumonia, pulmonary embolus, right side lung caner 2017. EXAM: CHEST - 2 VIEW COMPARISON:  Chest x-rays dated 04/08/2018 and 04/07/2018. Chest CT dated 03/23/2018. FINDINGS: Compared to the recent chest x-rays, there is an increasing collection of air at the RIGHT lung base, with associated air-fluid levels compatible with the given history of empyema, presumably reaccumulating. There is also increasing subcutaneous emphysema over the RIGHT lateral chest wall, which may indicate associated fistulous connection. LEFT lung is clear. Heart size and mediastinal contours are grossly stable. RIGHT chest wall Port-A-Cath is stable  in position. IMPRESSION: 1. Increased collection of air at the RIGHT lung base compared to most recent chest x-ray of 04/08/2018, now measuring approximately 8.6 cm greatest dimension, with associated air-fluid level, compatible with the given history of empyema, presumably interval worsening/reaccumulation. Consider follow-up chest CT for further characterization. 2. Also, increased subcutaneous emphysema over the RIGHT lateral chest wall. If no recent interventional procedure, this raises the possibility of underlying fistulous connection. Again, consider chest CT for further characterization. These results will be called to the ordering clinician or representative by the Radiologist Assistant, and communication documented in the PACS or zVision Dashboard. Electronically Signed: By: Franki Cabot M.D. On: 04/18/2018 16:58   Dg Chest Port 1 View  Result Date: 04/19/2018 CLINICAL DATA:  Surgery follow-up. EXAM: PORTABLE CHEST 1 VIEW COMPARISON:  Plain film of 04/18/2018. FINDINGS: Status post surgical wound debridement and chest tube insertion since yesterday's exam. Chest tube is seen at the RIGHT lung base with tip directed towards the RIGHT hilum. The collection of air at the RIGHT lung base has been resolved. There is now near complete opacification the RIGHT hemithorax, with associated volume loss. LEFT lung remains clear. Persistent subcutaneous emphysema overlying the RIGHT chest, an expected finding. IMPRESSION: RIGHT-sided chest tube in place status post interval surgical wound debridement. Associated interval resolution of the collection of air at the RIGHT lung base. Electronically Signed   By: Franki Cabot M.D.   On: 04/19/2018 21:06    Assessment/Plan: S/P Procedure(s) (LRB): IRRIGATION AND DEBRIDEMENT RIGHT CHEST WOUND (Right) CHEST TUBE INSERTION (Right) APPLICATION OF WOUND VAC CHEST TUBE INSERTION (Right) THORACOTOMY MAJOR  1 stable, hemodynamically stable 2 cont VAC and chest tube,  CXR with sub q air/ infiltrate that is fairly stable in appearance. CT without air leak 3 d/c aline and foley 4 decrease IVF rate 5 cont PCA 6 labs pending    LOS: 2 days    Erik [name] 04/20/2018 Patient seen and examined, agree with above VAC working well. No air leak from cT I discussed with Mr. Minner returning to or tomorrow for [facility] change, possible rib resection (modified Clagett procedure)He is aware of the general nature of the procedure. We discussed the indications, risks, benefits and alternatives. He understands the risks include but are not limited to death, MI, DVT, PE, bleeding, infection, possible need for transfusion, as well as other unforeseeable complications.  He accepts the risks and agrees to proceed.  Revonda Standard Roxan Hockey, MD [facility] [phone]

## 2018-04-20 NOTE — Anesthesia Preprocedure Evaluation (Addendum)
Anesthesia Evaluation  Patient identified by MRN, date of birth, ID band Patient awake    Reviewed: Allergy & Precautions, NPO status , Patient's Chart, lab work & pertinent test results  History of Anesthesia Complications Negative for: history of anesthetic complications  Airway Mallampati: II  TM Distance: >3 FB Neck ROM: Full    Dental  (+) Edentulous Upper, Missing, Poor Dentition, Dental Advisory Given   Pulmonary shortness of breath, asthma , pneumonia, COPD, Current Smoker, former smoker,  Pneumonia with empyema and lung CA and chronic pleural effusion    + wheezing      Cardiovascular hypertension, Pt. on medications Normal cardiovascular exam  03/2018: Left ventricle: Poor acousitic windows No apical window Overall  LVEF is probably mildly depressed Recomm limited echo with  contrast to see if helps in evaluation. The cavity size was  normal. Wall thickness was increased in a pattern of mild LVH. - Left atrium: The atrium was mildly dilated.   Neuro/Psych PSYCHIATRIC DISORDERS Anxiety Depression negative neurological ROS     GI/Hepatic Neg liver ROS, GERD  Medicated,  Endo/Other  negative endocrine ROS  Renal/GU negative Renal ROS     Musculoskeletal  (+) Arthritis ,   Abdominal   Peds  Hematology  (+) anemia ,   Anesthesia Other Findings   Reproductive/Obstetrics                           Lab Results  Component Value Date   WBC 9.9 04/20/2018   HGB 9.7 (L) 04/20/2018   HCT 31.6 (L) 04/20/2018   MCV 92.9 04/20/2018   PLT 308 04/20/2018   Lab Results  Component Value Date   CREATININE 1.36 (H) 04/20/2018   BUN 12 04/20/2018   NA 138 04/20/2018   K 3.7 04/20/2018   CL 99 (L) 04/20/2018   CO2 31 04/20/2018    Anesthesia Physical  Anesthesia Plan  ASA: III  Anesthesia Plan: General   Post-op Pain Management:    Induction: Intravenous  PONV Risk Score and Plan: 1  and Ondansetron, Dexamethasone and Treatment may vary due to age or medical condition  Airway Management Planned: Oral ETT  Additional Equipment: Arterial line  Intra-op Plan:   Post-operative Plan: Extubation in OR  Informed Consent: I have reviewed the patients History and Physical, chart, labs and discussed the procedure including the risks, benefits and alternatives for the proposed anesthesia with the patient or authorized representative who has indicated his/her understanding and acceptance.   Dental advisory given  Plan Discussed with: CRNA, Anesthesiologist and Surgeon  Anesthesia Plan Comments:       Anesthesia Quick Evaluation

## 2018-04-20 NOTE — Op Note (Signed)
NAME: Erik, [name] MEDICAL RECORD [id number] ACCOUNT [id number] DATE OF BIRTH:August 27, 1961 FACILITY: [facility] LOCATION: [facility] PHYSICIAN:Trevis Eden Chaya Jan, MD  OPERATIVE REPORT  DATE OF PROCEDURE:  04/19/2018  PREOPERATIVE DIAGNOSIS:  Right empyema with wound infection.  POSTOPERATIVE DIAGNOSIS:  Right empyema with wound infection.  PROCEDURES:  Incision and drainage of right chest wound, chest tube placement and application of wound vacuum assisted closure device.  SURGEON:   Modesto Charon, MD.  ASSISTANT:  Jadene Pierini  ANESTHESIA:  General.  FINDINGS:  Minimal fluid and necrotic tissue in the chest incision, approximately 2 cm opening into the chest cavity.  Right lung trapped with pleural peel.  Murky fluid, minimal fibrinous debris.  CLINICAL NOTE:  The patient is a 57 year old gentleman with a history of stage III lung cancer being treated with chemo and previously having had radiation.  He was hospitalized last month with pneumonia complicated by an empyema.  He underwent a right  VATS for decortication.  He was discharged about 10 days prior to admission on Augmentin.  He returned to the office yesterday afternoon with marked swelling of his chest wall and erythema of the skin.  A chest x-ray showed air fluid level in both the  chest wall and the right pleural space.  He was admitted, started on intravenous antibiotics and now is advised to undergo incision and drainage of the wound and management of the pleural space.  The indications, risks, benefits, and alternatives were  discussed in detail with the patient.  He understood and accepted the risks and agreed to proceed.  DESCRIPTION OF PROCEDURE:  The patient was brought to the operating room on 04/19/2018.  He had induction of general anesthesia and was intubated with a double lumen endotracheal tube.  A Foley catheter was placed.  Sequential compression devices were  placed on the calves for DVT prophylaxis.  He was  placed in a left lateral decubitus position and the right chest was prepped and draped in the usual sterile fashion.  After performing a timeout, the staples were removed from the skin incision and the incision was opened.  There was minimal murky fluid and some air noted in the wound.  The muscle, fascia and subcutaneous tissue were viable with very minimal necrotic  subcutaneous tissue present.  There was a small 2 cm x 2 cm defect leading into the pleural space.  The thoracoscope was advanced through the incision and through this opening into the pleural space.  There was dark brown murky fluid with minimal  fibrinous debris.  The lung appeared entrapped with a fibrinous peel.  Given the lack of success getting the lung to completely reexpand previously, no attempt was made to decorticate the lung.  A 32 [place] Blake drain was placed through one of the  original chest tube incisions and placed into the pleural space.  The fluid was evacuated.  The chest was copiously irrigated with warm saline.  The chest tube was secured with a #1 silk suture.  The intercostal defect was closed with a 2 x 2 cm piece of  CorMatrix was sewn in place with a running 2-0 Vicryl suture.  A VAC sponge was cut to fit the wound.  There was a deeper sponge as well as a more superficial sponge.  The dressing was applied.  The chest tube was placed to suction and the VAC was  turned on and placed to suction at 75 mmHg.    The patient was extubated in the operating room and  taken to the Postanesthetic Care Unit in good condition.  AN/NUANCE  D:04/19/2018 T:04/20/2018 JOB:[id number]

## 2018-04-20 NOTE — Anesthesia Postprocedure Evaluation (Signed)
Anesthesia Post Note  Patient: Erik [name]  Procedure(s) Performed: IRRIGATION AND DEBRIDEMENT RIGHT CHEST WOUND (Right ) APPLICATION OF WOUND VAC (Chest) CHEST TUBE INSERTION (Right Chest) THORACOTOMY MAJOR (Chest) CHEST TUBE INSERTION (Right )     Patient location during evaluation: PACU Anesthesia Type: General Level of consciousness: awake and alert Pain management: pain level controlled Vital Signs Assessment: post-procedure vital signs reviewed and stable Respiratory status: spontaneous breathing, nonlabored ventilation, respiratory function stable and patient connected to nasal cannula oxygen Cardiovascular status: blood pressure returned to baseline and stable Postop Assessment: no apparent nausea or vomiting Anesthetic complications: no    Last Vitals:  Vitals:   04/20/18 0900 04/20/18 1014  BP:  119/88  Pulse: 94 92  Resp: 19 14  Temp:    SpO2: 99% 98%    Last Pain:  Vitals:   04/20/18 0808  TempSrc:   PainSc: 4                  Tiajuana Amass

## 2018-04-21 ENCOUNTER — Inpatient Hospital Stay (HOSPITAL_COMMUNITY): Payer: Medicare Other | Admitting: Certified Registered"

## 2018-04-21 ENCOUNTER — Encounter (HOSPITAL_COMMUNITY)
Admission: AD | Disposition: A | Discharge status: Home Health | Payer: Self-pay | Source: Home / Self Care | Attending: Thoracic Surgery (Cardiothoracic Vascular Surgery)

## 2018-04-21 HISTORY — PX: APPLICATION OF WOUND VAC: SHX5189

## 2018-04-21 HISTORY — PX: VIDEO ASSISTED THORACOSCOPY: SHX5073

## 2018-04-21 LAB — SURGICAL PCR SCREEN
MRSA, PCR: NEGATIVE
STAPHYLOCOCCUS AUREUS: NEGATIVE

## 2018-04-21 SURGERY — APPLICATION, WOUND VAC
Anesthesia: General | Site: Chest | Laterality: Right

## 2018-04-21 MED ORDER — LIDOCAINE HCL (CARDIAC) PF 100 MG/5ML IV SOSY
PREFILLED_SYRINGE | INTRAVENOUS | Status: DC | PRN
  Administered 2018-04-21: 80 mg via INTRAVENOUS

## 2018-04-21 MED ORDER — ONDANSETRON HCL 4 MG/2ML IJ SOLN
INTRAMUSCULAR | Status: DC | PRN
  Administered 2018-04-21: 4 mg via INTRAVENOUS

## 2018-04-21 MED ORDER — LIDOCAINE 2% (20 MG/ML) 5 ML SYRINGE
INTRAMUSCULAR | Status: AC
Start: 2018-04-21 — End: ?
  Filled 2018-04-21: qty 5

## 2018-04-21 MED ORDER — LACTATED RINGERS IV SOLN
INTRAVENOUS | Status: DC | PRN
  Administered 2018-04-21: 07:00:00 via INTRAVENOUS

## 2018-04-21 MED ORDER — SUGAMMADEX SODIUM 200 MG/2ML IV SOLN
INTRAVENOUS | Status: AC
  Filled 2018-04-21: qty 2

## 2018-04-21 MED ORDER — FENTANYL CITRATE (PF) 250 MCG/5ML IJ SOLN
INTRAMUSCULAR | Status: AC
  Filled 2018-04-21: qty 5

## 2018-04-21 MED ORDER — ONDANSETRON HCL 4 MG/2ML IJ SOLN
INTRAMUSCULAR | Status: AC
  Filled 2018-04-21: qty 2

## 2018-04-21 MED ORDER — BUPIVACAINE LIPOSOME 1.3 % IJ SUSP
20.0000 mL | INTRAMUSCULAR | Status: DC
  Filled 2018-04-21: qty 20

## 2018-04-21 MED ORDER — DEXAMETHASONE SODIUM PHOSPHATE 10 MG/ML IJ SOLN
INTRAMUSCULAR | Status: AC
  Filled 2018-04-21: qty 1

## 2018-04-21 MED ORDER — HYDROMORPHONE HCL 2 MG/ML IJ SOLN: 0.2500 mg | INTRAMUSCULAR | Status: DC | PRN

## 2018-04-21 MED ORDER — PROPOFOL 10 MG/ML IV BOLUS
INTRAVENOUS | Status: DC | PRN
  Administered 2018-04-21: 150 mg via INTRAVENOUS

## 2018-04-21 MED ORDER — ALBUTEROL SULFATE (2.5 MG/3ML) 0.083% IN NEBU
2.5000 mg | INHALATION_SOLUTION | Freq: Once | RESPIRATORY_TRACT | Status: DC
  Filled 2018-04-21: qty 3

## 2018-04-21 MED ORDER — PHENYLEPHRINE HCL 10 MG/ML IJ SOLN
INTRAMUSCULAR | Status: DC | PRN
  Administered 2018-04-21: 80 ug via INTRAVENOUS
  Administered 2018-04-21: 40 ug via INTRAVENOUS

## 2018-04-21 MED ORDER — MIDAZOLAM HCL 2 MG/2ML IJ SOLN
INTRAMUSCULAR | Status: AC
  Filled 2018-04-21: qty 2

## 2018-04-21 MED ORDER — ALBUTEROL SULFATE (2.5 MG/3ML) 0.083% IN NEBU
INHALATION_SOLUTION | RESPIRATORY_TRACT | Status: AC
  Filled 2018-04-21: qty 3

## 2018-04-21 MED ORDER — MIDAZOLAM HCL 2 MG/2ML IJ SOLN
INTRAMUSCULAR | Status: AC
Start: 2018-04-21 — End: ?
  Filled 2018-04-21: qty 2

## 2018-04-21 MED ORDER — KCL IN DEXTROSE-NACL 20-5-0.45 MEQ/L-%-% IV SOLN
INTRAVENOUS | Status: DC
  Administered 2018-04-21 – 2018-04-24 (×5): via INTRAVENOUS
  Filled 2018-04-21 (×9): qty 1000

## 2018-04-21 MED ORDER — ROCURONIUM BROMIDE 50 MG/5ML IV SOLN
INTRAVENOUS | Status: AC
  Filled 2018-04-21: qty 1

## 2018-04-21 MED ORDER — SUGAMMADEX SODIUM 200 MG/2ML IV SOLN
INTRAVENOUS | Status: DC | PRN
  Administered 2018-04-21: 200 mg via INTRAVENOUS

## 2018-04-21 MED ORDER — 0.9 % SODIUM CHLORIDE (POUR BTL) OPTIME
TOPICAL | Status: DC | PRN
  Administered 2018-04-21: 2000 mL

## 2018-04-21 MED ORDER — SUCCINYLCHOLINE CHLORIDE 200 MG/10ML IV SOSY
PREFILLED_SYRINGE | INTRAVENOUS | Status: AC
  Filled 2018-04-21: qty 10

## 2018-04-21 MED ORDER — DEXAMETHASONE SODIUM PHOSPHATE 10 MG/ML IJ SOLN
INTRAMUSCULAR | Status: DC | PRN
  Administered 2018-04-21: 5 mg via INTRAVENOUS

## 2018-04-21 MED ORDER — PROMETHAZINE HCL 25 MG/ML IJ SOLN: 6.2500 mg | INTRAMUSCULAR | Status: DC | PRN

## 2018-04-21 MED ORDER — PHENYLEPHRINE 40 MCG/ML (10ML) SYRINGE FOR IV PUSH (FOR BLOOD PRESSURE SUPPORT)
PREFILLED_SYRINGE | INTRAVENOUS | Status: AC
Start: 2018-04-21 — End: ?
  Filled 2018-04-21: qty 10

## 2018-04-21 MED ORDER — PROPOFOL 10 MG/ML IV BOLUS
INTRAVENOUS | Status: AC
  Filled 2018-04-21: qty 40

## 2018-04-21 MED ORDER — ENOXAPARIN SODIUM 100 MG/ML ~~LOC~~ SOLN
1.0000 mg/kg | Freq: Two times a day (BID) | SUBCUTANEOUS | Status: AC
  Administered 2018-04-21 – 2018-04-23 (×4): 95 mg via SUBCUTANEOUS
  Filled 2018-04-21 (×4): qty 1

## 2018-04-21 MED ORDER — PHENYLEPHRINE HCL 10 MG/ML IJ SOLN
INTRAVENOUS | Status: DC | PRN
  Administered 2018-04-21: 20 ug/min via INTRAVENOUS

## 2018-04-21 MED ORDER — ROCURONIUM BROMIDE 100 MG/10ML IV SOLN
INTRAVENOUS | Status: DC | PRN
  Administered 2018-04-21: 20 mg via INTRAVENOUS
  Administered 2018-04-21: 40 mg via INTRAVENOUS
  Administered 2018-04-21: 10 mg via INTRAVENOUS

## 2018-04-21 MED ORDER — ENOXAPARIN SODIUM 150 MG/ML ~~LOC~~ SOLN: 1.5000 mg/kg | Freq: Two times a day (BID) | SUBCUTANEOUS | Status: DC

## 2018-04-21 MED ORDER — SUCCINYLCHOLINE CHLORIDE 20 MG/ML IJ SOLN
INTRAMUSCULAR | Status: DC | PRN
  Administered 2018-04-21: 100 mg via INTRAVENOUS

## 2018-04-21 MED ORDER — VANCOMYCIN HCL IN DEXTROSE 750-5 MG/150ML-% IV SOLN
750.0000 mg | Freq: Two times a day (BID) | INTRAVENOUS | Status: DC
  Administered 2018-04-21 – 2018-04-22 (×4): 750 mg via INTRAVENOUS
  Filled 2018-04-21 (×5): qty 150

## 2018-04-21 SURGICAL SUPPLY — 74 items
APPLIER CLIP ROT 10 11.4 M/L (STAPLE)
CANISTER SUCT 3000ML PPV (MISCELLANEOUS) ×4 IMPLANT
CATH KIT ON Q 5IN SLV (PAIN MANAGEMENT) IMPLANT
CATH THORACIC 28FR (CATHETERS) IMPLANT
CATH THORACIC 28FR RT ANG (CATHETERS) IMPLANT
CATH THORACIC 36FR (CATHETERS) IMPLANT
CATH THORACIC 36FR RT ANG (CATHETERS) IMPLANT
CLIP APPLIE ROT 10 11.4 M/L (STAPLE) IMPLANT
CLIP VESOCCLUDE MED 6/CT (CLIP) IMPLANT
CONN ST 1/4X3/8  BEN (MISCELLANEOUS) ×2
CONN ST 1/4X3/8 BEN (MISCELLANEOUS) ×2 IMPLANT
CONN Y 3/8X3/8X3/8  BEN (MISCELLANEOUS) ×2
CONN Y 3/8X3/8X3/8 BEN (MISCELLANEOUS) ×2 IMPLANT
CONT SPEC 4OZ CLIKSEAL STRL BL (MISCELLANEOUS) ×8 IMPLANT
COVER SURGICAL LIGHT HANDLE (MISCELLANEOUS) ×4 IMPLANT
DERMABOND ADVANCED (GAUZE/BANDAGES/DRESSINGS)
DERMABOND ADVANCED .7 DNX12 (GAUZE/BANDAGES/DRESSINGS) IMPLANT
DRAIN CHANNEL 28F RND 3/8 FF (WOUND CARE) IMPLANT
DRAIN CHANNEL 32F RND 10.7 FF (WOUND CARE) ×4 IMPLANT
DRAPE LAPAROSCOPIC ABDOMINAL (DRAPES) ×4 IMPLANT
DRAPE SLUSH/WARMER DISC (DRAPES) ×4 IMPLANT
DRSG VAC ATS LRG SENSATRAC (GAUZE/BANDAGES/DRESSINGS) ×4 IMPLANT
ELECT REM PT RETURN 9FT ADLT (ELECTROSURGICAL) ×4
ELECTRODE REM PT RTRN 9FT ADLT (ELECTROSURGICAL) ×2 IMPLANT
GAUZE SPONGE 4X4 12PLY STRL (GAUZE/BANDAGES/DRESSINGS) ×4 IMPLANT
GLOVE SURG SIGNA 7.5 PF LTX (GLOVE) ×8 IMPLANT
GOWN STRL REUS W/ TWL LRG LVL3 (GOWN DISPOSABLE) ×4 IMPLANT
GOWN STRL REUS W/ TWL XL LVL3 (GOWN DISPOSABLE) ×2 IMPLANT
GOWN STRL REUS W/TWL LRG LVL3 (GOWN DISPOSABLE) ×4
GOWN STRL REUS W/TWL XL LVL3 (GOWN DISPOSABLE) ×2
HEMOSTAT SURGICEL 2X14 (HEMOSTASIS) IMPLANT
IV CATH 22GX1 FEP (IV SOLUTION) IMPLANT
KIT BASIN OR (CUSTOM PROCEDURE TRAY) ×4 IMPLANT
KIT SUCTION CATH 14FR (SUCTIONS) ×4 IMPLANT
KIT TURNOVER KIT B (KITS) ×4 IMPLANT
NS IRRIG 1000ML POUR BTL (IV SOLUTION) ×8 IMPLANT
PACK CHEST (CUSTOM PROCEDURE TRAY) ×4 IMPLANT
PAD ARMBOARD 7.5X6 YLW CONV (MISCELLANEOUS) ×8 IMPLANT
POUCH ENDO CATCH II 15MM (MISCELLANEOUS) IMPLANT
POUCH SPECIMEN RETRIEVAL 10MM (ENDOMECHANICALS) IMPLANT
SEALANT PROGEL (MISCELLANEOUS) IMPLANT
SEALANT SURG COSEAL 4ML (VASCULAR PRODUCTS) IMPLANT
SEALANT SURG COSEAL 8ML (VASCULAR PRODUCTS) IMPLANT
SOLUTION ANTI FOG 6CC (MISCELLANEOUS) ×4 IMPLANT
SPECIMEN JAR MEDIUM (MISCELLANEOUS) ×4 IMPLANT
SPONGE INTESTINAL PEANUT (DISPOSABLE) IMPLANT
SPONGE TONSIL 1 RF SGL (DISPOSABLE) ×4 IMPLANT
SUT PROLENE 4 0 RB 1 (SUTURE)
SUT PROLENE 4-0 RB1 .5 CRCL 36 (SUTURE) IMPLANT
SUT SILK  1 MH (SUTURE) ×6
SUT SILK 1 MH (SUTURE) ×6 IMPLANT
SUT SILK 2 0SH CR/8 30 (SUTURE) IMPLANT
SUT SILK 3 0SH CR/8 30 (SUTURE) IMPLANT
SUT VIC AB 1 CTX 36 (SUTURE)
SUT VIC AB 1 CTX36XBRD ANBCTR (SUTURE) IMPLANT
SUT VIC AB 2-0 CTX 36 (SUTURE) IMPLANT
SUT VIC AB 2-0 UR6 27 (SUTURE) IMPLANT
SUT VIC AB 3-0 MH 27 (SUTURE) IMPLANT
SUT VIC AB 3-0 SH 27 (SUTURE) ×2
SUT VIC AB 3-0 SH 27X BRD (SUTURE) ×2 IMPLANT
SUT VIC AB 3-0 X1 27 (SUTURE) ×4 IMPLANT
SUT VICRYL 2 TP 1 (SUTURE) IMPLANT
SYR 20CC LL (SYRINGE) IMPLANT
SYSTEM SAHARA CHEST DRAIN ATS (WOUND CARE) ×4 IMPLANT
TAPE CLOTH 4X10 WHT NS (GAUZE/BANDAGES/DRESSINGS) ×4 IMPLANT
TIP APPLICATOR SPRAY EXTEND 16 (VASCULAR PRODUCTS) IMPLANT
TOWEL GREEN STERILE (TOWEL DISPOSABLE) ×8 IMPLANT
TOWEL GREEN STERILE FF (TOWEL DISPOSABLE) ×4 IMPLANT
TRAY FOLEY MTR SLVR 16FR STAT (SET/KITS/TRAYS/PACK) ×4 IMPLANT
TROCAR XCEL BLADELESS 5X75MML (TROCAR) ×8 IMPLANT
TROCAR XCEL NON-BLD 5MMX100MML (ENDOMECHANICALS) IMPLANT
TUNNELER SHEATH ON-Q 11GX8 DSP (PAIN MANAGEMENT) IMPLANT
WATER STERILE IRR 1000ML POUR (IV SOLUTION) ×8 IMPLANT
WND VAC CANISTER 500ML (MISCELLANEOUS) ×4 IMPLANT

## 2018-04-21 NOTE — Progress Notes (Signed)
Pharmacy Antibiotic Note  Erik [name] is a 57 y.o. male admitted on 04/18/2018 with right empyema with wound infection.  Pharmacy has been consulted for Vancocin and Zosyn dosing.  Vanc trough above goal; dose had been started prior to lab result, then stopped after ~2/3 of dose infused.  Plan: Change vancomycin to 750mg  IV every 12 hours for calculated trough ~16.  Goal trough 15-20 mcg/mL. Continue Zosyn 3.375g IV q8h (4 hour infusion).  Height: 6' 0.01" (182.9 cm) Weight: 216 lb 6.4 oz (98.2 kg) IBW/kg (Calculated) : 77.62  Temp (24hrs), Avg:98.2 F (36.8 C), Min:97.6 F (36.4 C), Max:98.6 F (37 C)  Recent Labs  Lab 04/17/18 1549 04/18/18 1941 04/20/18 0928 04/20/18 2131  WBC 9.2 10.2 9.9  --   CREATININE 1.47* 1.37* 1.36*  --   VANCOTROUGH  --   --   --  27*    Estimated Creatinine Clearance: 74.5 mL/min (A) (by C-G formula based on SCr of 1.36 mg/dL (H)).    Allergies  Allergen Reactions  . Demeclocycline Swelling    SWELLING REACTION UNSPECIFIED   . Tetracyclines & Related Swelling    SWELLING REACTION UNSPECIFIED     Antimicrobials this admission: Vanc 5/14 >>  Zosyn 5/14 >>   Dose adjustments this admission: Vanc 1250 Q12 > trough 27 > vanc 750 Q12  Microbiology results: 5/15 R chest tissue > pending   Thank you for allowing pharmacy to be a part of this patient's care.  Wynona Neat, PharmD, BCPS  04/21/2018 12:24 AM

## 2018-04-21 NOTE — Interval H&P Note (Signed)
History and Physical Interval Note:  04/21/2018 7:24 AM  Erik [name]  has presented today for surgery, with the diagnosis of WOUND INFECTION  The various methods of treatment have been discussed with the patient and family. After consideration of risks, benefits and other options for treatment, the patient has consented to  Procedure(s): WOUND VAC CHANGE (Right) possible VIDEO ASSISTED THORACOSCOPY (Right) possible RIB RESECTION (Right) as a surgical intervention .  The patient's history has been reviewed, patient examined, no change in status, stable for surgery.  I have reviewed the patient's chart and labs.  Questions were answered to the patient's satisfaction.     [name]

## 2018-04-21 NOTE — Transfer of Care (Signed)
Immediate Anesthesia Transfer of Care Note  Patient: Erik [name]  Procedure(s) Performed: WOUND VAC CHANGE (Right ) VIDEO ASSISTED THORACOSCOPY (Right Chest)  Patient Location: PACU  Anesthesia Type:General  Level of Consciousness: oriented, drowsy and patient cooperative (drowsiness at the baseline)  Airway & Oxygen Therapy: Patient Spontanous Breathing and Patient connected to face mask oxygen  Post-op Assessment: Report given to RN and Post -op Vital signs reviewed and stable  Post vital signs: Reviewed and stable  Last Vitals:  Vitals Value Taken Time  BP    Temp    Pulse    Resp    SpO2      Last Pain:  Vitals:   04/21/18 0513  TempSrc: Oral  PainSc:          Complications: No apparent anesthesia complications

## 2018-04-21 NOTE — Brief Op Note (Signed)
04/18/2018 - 04/21/2018  9:12 AM  PATIENT:  Erik [name]  57 y.o. male  PRE-OPERATIVE DIAGNOSIS:  WOUND INFECTION  POST-OPERATIVE DIAGNOSIS:  WOUND INFECTION  PROCEDURE:  Procedure(s):  WOUND VAC CHANGE (Right)  VIDEO ASSISTED THORACOSCOPY (Right) -Insertion of 32 Blake Chest Drain  SURGEON:  Surgeon(s) and Role:    * [name], MD - Primary  PHYSICIAN ASSISTANT: Ellwood Handler PA-C  ANESTHESIA:   general  EBL:  50 mL   BLOOD ADMINISTERED:none  DRAINS: 32 Blake Drain, Wound Vac   LOCAL MEDICATIONS USED:  NONE  SPECIMEN:  No Specimen  DISPOSITION OF SPECIMEN:  N/A  COUNTS:  YES  TOURNIQUET:  * No tourniquets in log *  DICTATION: .Dragon Dictation  PLAN OF CARE: Return to Telemetry bed  PATIENT DISPOSITION:  PACU - hemodynamically stable.   Delay start of Pharmacological VTE agent (>24hrs) due to surgical blood loss or risk of bleeding: no

## 2018-04-21 NOTE — Anesthesia Procedure Notes (Signed)
Procedure Name: Intubation Date/Time: 04/21/2018 7:46 AM Performed by: Cleda Daub, CRNA Pre-anesthesia Checklist: Patient identified, Emergency Drugs available, Suction available and Patient being monitored Patient Re-evaluated:Patient Re-evaluated prior to induction Oxygen Delivery Method: Circle system utilized Preoxygenation: Pre-oxygenation with 100% oxygen Induction Type: IV induction Ventilation: Mask ventilation without difficulty and Mask ventilation throughout procedure Laryngoscope Size: 3 and Mac Grade View: Grade I Tube type: Oral Tube size: 8.0 mm Number of attempts: 1 Airway Equipment and Method: Stylet Placement Confirmation: ETT inserted through vocal cords under direct vision,  positive ETCO2 and breath sounds checked- equal and bilateral Secured at: 23 cm Tube secured with: Tape Dental Injury: Teeth and Oropharynx as per pre-operative assessment  Comments: Surgeon requested SLT.

## 2018-04-21 NOTE — Progress Notes (Signed)
Pt arrived back from PACU. New wound vac to right chest. Chest tube with new [place] system. Chest tube and wound vac dressings assessed; both are clean, dry, and intact. Vitals stable. Pt drowsy, but reoriented to room and staff. PCA infusing. Will continue current plan of care.   Ara Kussmaul BSN, RN

## 2018-04-21 NOTE — Anesthesia Postprocedure Evaluation (Signed)
Anesthesia Post Note  Patient: Erik [name]  Procedure(s) Performed: WOUND VAC CHANGE (Right ) VIDEO ASSISTED THORACOSCOPY (Right Chest)     Patient location during evaluation: PACU Anesthesia Type: General Level of consciousness: sedated Pain management: pain level controlled Vital Signs Assessment: post-procedure vital signs reviewed and stable Respiratory status: spontaneous breathing and respiratory function stable Cardiovascular status: stable Postop Assessment: no apparent nausea or vomiting Anesthetic complications: no    Last Vitals:  Vitals:   04/21/18 1021 04/21/18 1035  BP: 123/81 103/69  Pulse: 89 87  Resp: 16 16  Temp:  (!) 36.4 C  SpO2: 92% 94%    Last Pain:  Vitals:   04/21/18 1018  TempSrc:   PainSc: 0-No pain                 Saul Fabiano DANIEL

## 2018-04-22 ENCOUNTER — Inpatient Hospital Stay: Payer: Self-pay

## 2018-04-22 ENCOUNTER — Inpatient Hospital Stay (HOSPITAL_COMMUNITY): Payer: Medicare Other

## 2018-04-22 ENCOUNTER — Encounter (HOSPITAL_COMMUNITY): Payer: Self-pay | Admitting: Thoracic Surgery (Cardiothoracic Vascular Surgery)

## 2018-04-22 MED ORDER — SODIUM CHLORIDE 0.9% FLUSH: 10.0000 mL | INTRAVENOUS | Status: DC | PRN

## 2018-04-22 MED ORDER — SACCHAROMYCES BOULARDII 250 MG PO CAPS
250.0000 mg | ORAL_CAPSULE | Freq: Two times a day (BID) | ORAL | Status: DC
  Administered 2018-04-22 – 2018-05-01 (×17): 250 mg via ORAL
  Filled 2018-04-22 (×17): qty 1

## 2018-04-22 NOTE — Progress Notes (Signed)
Bedside report given to [facility]. Pt resting in bed ao x3. Denies any needs at the moment. Call light within reach. Will continue to monitor.

## 2018-04-22 NOTE — Progress Notes (Signed)
Patient ambulated 400 feet in hallway with chest tube to water seal and tolerated well. Returned to bed and 20cm wall suction restored to chest tube.

## 2018-04-22 NOTE — Progress Notes (Addendum)
      [address]       [place] [address]             [phone]      1 Day Post-Op Procedure(s) (LRB): WOUND VAC CHANGE (Right) VIDEO ASSISTED THORACOSCOPY (Right) Subjective: Feels okay. He is getting a midline  Objective: Vital signs in last 24 hours: Temp:  [97.5 F (36.4 C)-98.8 F (37.1 C)] 98 F (36.7 C) (05/18 0845) Pulse Rate:  [87-91] 91 (05/17 1108) Cardiac Rhythm: Normal sinus rhythm;Heart block (05/18 0700) Resp:  [12-23] 23 (05/18 0845) BP: (101-123)/(68-85) 104/77 (05/18 0845) SpO2:  [92 %-98 %] 94 % (05/18 0845)     Intake/Output from previous day: 05/17 0701 - 05/18 0700 In: 2770 [I.V.:2320; IV Piggyback:450] Out: 2842 [Urine:2350; Drains:50; Blood:50; Chest Tube:392] Intake/Output this shift: No intake/output data recorded.  General appearance: alert, cooperative and no distress Heart: regular rate and rhythm, S1, S2 normal, no murmur, click, rub or gallop Lungs: clear to auscultation bilaterally Abdomen: soft, non-tender; bowel sounds normal; no masses,  no organomegaly Extremities: extremities normal, atraumatic, no cyanosis or edema Wound: wound vac in place with good suction  Lab Results: Recent Labs    04/20/18 0928  WBC 9.9  HGB 9.7*  HCT 31.6*  PLT 308   BMET:  Recent Labs    04/20/18 0928  NA 138  K 3.7  CL 99*  CO2 31  GLUCOSE 206*  BUN 12  CREATININE 1.36*  CALCIUM 7.7*    PT/INR: No results for input(s): LABPROT, INR in the last 72 hours. ABG    Component Value Date/Time   PHART 7.460 (H) 03/29/2018 0308   HCO3 34.1 (H) 03/29/2018 0308   TCO2 36 (H) 03/29/2018 0308   ACIDBASEDEF 4.0 (H) 07/19/2016 1407   O2SAT 97.0 03/29/2018 0308   CBG (last 3)  No results for input(s): GLUCAP in the last 72 hours.  Assessment/Plan: S/P Procedure(s) (LRB): WOUND VAC CHANGE (Right) VIDEO ASSISTED THORACOSCOPY (Right)  1. CV-NSR in the 90s to ST in the low 100s. BP well controlled.  2. Pulm-tolerating  room air with good oxygen saturation 3. Renal-last creatinine 1.36, electrolytes okay 4. H and H stable 5. Continue antibiotics.  6. Poor peripheral access with multiple IVs infiltrating. Since the patient has a porta cath for his chemo it was recommended to also place a midline for two reliable lines on multiple IV meds  Plan: back to OR Monday for debridement. Restarted probiotics.    LOS: 4 days    Erik [name] 04/22/2018  Midline cath and port for iv I have seen and examined Erik [name] and agree with the above assessment  and plan.  Grace Isaac MD Beeper [phone] Office [phone] 04/22/2018 11:10 AM

## 2018-04-22 NOTE — Progress Notes (Signed)
Spoke with RN re PICC order.  Pt has a PAC RCW that was last accessed approximately 2 weeks ago.  VAS Team will access the PAC and possibly attempt PIV due to existing PIV in [facility].  RN to cancel PICC order at this time.

## 2018-04-22 NOTE — Progress Notes (Deleted)
Patient ambulated 400 feet in hallway with chest tube to water seal and tolerated well. Returned to bed and -49mm wall suction restored to chest tube.

## 2018-04-22 NOTE — Plan of Care (Signed)
  Problem: Education: Goal: Knowledge of General Education information will improve Outcome: Progressing   Problem: Health Behavior/Discharge Planning: Goal: Ability to manage health-related needs will improve Outcome: Progressing   Problem: Clinical Measurements: Goal: Ability to maintain clinical measurements within normal limits will improve Outcome: Progressing Goal: Will remain free from infection Outcome: Progressing Goal: Diagnostic test results will improve Outcome: Progressing Goal: Cardiovascular complication will be avoided Outcome: Progressing   Problem: Activity: Goal: Risk for activity intolerance will decrease Outcome: Progressing   Problem: Coping: Goal: Level of anxiety will decrease Outcome: Progressing   Problem: Elimination: Goal: Will not experience complications related to urinary retention Outcome: Progressing   Problem: Pain Managment: Goal: General experience of comfort will improve Outcome: Progressing   Problem: Safety: Goal: Ability to remain free from injury will improve Outcome: Progressing   Problem: Skin Integrity: Goal: Risk for impaired skin integrity will decrease Outcome: Progressing

## 2018-04-22 NOTE — Progress Notes (Signed)
Spoke with PA and Dr Servando Snare re IV access.  PAC accessed, but needs a second for IV meds ordered.  OK to place midline.  Pt agreeable and voices understanding of procedure.  Larry Sierras notified after procedure completed to change IV tubings , connect to PAC or Midline, and to d/c LAC PIV.

## 2018-04-23 LAB — BASIC METABOLIC PANEL
Anion gap: 8 (ref 5–15)
BUN: 11 mg/dL (ref 6–20)
CO2: 30 mmol/L (ref 22–32)
CREATININE: 1.53 mg/dL — AB (ref 0.61–1.24)
Calcium: 7.8 mg/dL — ABNORMAL LOW (ref 8.9–10.3)
Chloride: 103 mmol/L (ref 101–111)
GFR calc Af Amer: 57 mL/min — ABNORMAL LOW (ref >60)
GFR, EST NON AFRICAN AMERICAN: 50 mL/min — AB (ref >60)
Glucose, Bld: 97 mg/dL (ref 65–99)
POTASSIUM: 3.5 mmol/L (ref 3.5–5.1)
SODIUM: 141 mmol/L (ref 135–145)

## 2018-04-23 LAB — TYPE AND SCREEN
ABO/RH(D): O NEG
ANTIBODY SCREEN: NEGATIVE

## 2018-04-23 LAB — CBC
HCT: 29.6 % — ABNORMAL LOW (ref 39.0–52.0)
Hemoglobin: 8.9 g/dL — ABNORMAL LOW (ref 13.0–17.0)
MCH: 28.8 pg (ref 26.0–34.0)
MCHC: 30.1 g/dL (ref 30.0–36.0)
MCV: 95.8 fL (ref 78.0–100.0)
PLATELETS: 235 10*3/uL (ref 150–400)
RBC: 3.09 MIL/uL — AB (ref 4.22–5.81)
RDW: 16.1 % — ABNORMAL HIGH (ref 11.5–15.5)
WBC: 7.5 10*3/uL (ref 4.0–10.5)

## 2018-04-23 MED ORDER — LINEZOLID 600 MG/300ML IV SOLN
600.0000 mg | Freq: Two times a day (BID) | INTRAVENOUS | Status: DC
  Administered 2018-04-23 – 2018-05-01 (×17): 600 mg via INTRAVENOUS
  Filled 2018-04-23 (×25): qty 300

## 2018-04-23 NOTE — Progress Notes (Addendum)
      [address]       [place] [address]             [phone]      2 Days Post-Op Procedure(s) (LRB): WOUND VAC CHANGE (Right) VIDEO ASSISTED THORACOSCOPY (Right) Subjective: Feels tired and not well this morning. He seems depressed.   Objective: Vital signs in last 24 hours: Temp:  [98.4 F (36.9 C)-98.7 F (37.1 C)] 98.4 F (36.9 C) (05/19 0742) Pulse Rate:  [103] 103 (05/18 2029) Cardiac Rhythm: Normal sinus rhythm;Heart block (05/19 0700) Resp:  [13-21] 14 (05/19 0747) BP: (121-132)/(85-94) 132/94 (05/19 0742) SpO2:  [96 %-98 %] 98 % (05/19 0747) Weight:  [223 lb 5.2 oz (101.3 kg)] 223 lb 5.2 oz (101.3 kg) (05/19 0635)     Intake/Output from previous day: 05/18 0701 - 05/19 0700 In: 3326.7 [P.O.:1300; I.V.:1556.7; IV Piggyback:450] Out: 5909 [Urine:3775; Drains:70; Stool:1; Chest Tube:110] Intake/Output this shift: Total I/O In: 240 [P.O.:240] Out: -   General appearance: alert, cooperative and no distress Heart: regular rate and rhythm, S1, S2 normal, no murmur, click, rub or gallop Lungs: clear to auscultation bilaterally Abdomen: soft, non-tender; bowel sounds normal; no masses,  no organomegaly Extremities: 1-2+ pitting pedal edema Wound: wound vac with good suction  Lab Results: Recent Labs    04/23/18 0343  WBC 7.5  HGB 8.9*  HCT 29.6*  PLT 235   BMET:  Recent Labs    04/23/18 0343  NA 141  K 3.5  CL 103  CO2 30  GLUCOSE 97  BUN 11  CREATININE 1.53*  CALCIUM 7.8*    PT/INR: No results for input(s): LABPROT, INR in the last 72 hours. ABG    Component Value Date/Time   PHART 7.460 (H) 03/29/2018 0308   HCO3 34.1 (H) 03/29/2018 0308   TCO2 36 (H) 03/29/2018 0308   ACIDBASEDEF 4.0 (H) 07/19/2016 1407   O2SAT 97.0 03/29/2018 0308   CBG (last 3)  No results for input(s): GLUCAP in the last 72 hours.  Assessment/Plan: S/P Procedure(s) (LRB): WOUND VAC CHANGE (Right) VIDEO ASSISTED THORACOSCOPY (Right)  1.  Cultures from 5/15 grew Enterococcus faecium, no sensitivity yet. On Vanco and Zosyn. Continue Florastor. Last Vanco trough 27 on 5/16.  2. Pulm- tolerating room air with good oxygen saturation 3. Renal- creatinine is climbing 1.53 today. Might be due to the Vanco. Limit nephrotoxic drugs 4. H and H stable, 8.9/29.6 5. Access with midline and port.   Plan: OR Monday    LOS: 5 days    [name] 04/23/2018  For vac change in or in am I have seen and examined Erik [name] and agree with the above assessment  and plan.  Grace Isaac MD Beeper [phone] Office [phone] 04/23/2018 11:29 AM

## 2018-04-23 NOTE — Progress Notes (Addendum)
Pharmacy Antibiotic Note  Erik [name] is a 57 y.o. male admitted on 04/18/2018 with right empyema with wound infection.  Pharmacy has been following for Vancocin and Zosyn dosing.  Today his micro data reveals Vancomycin resistant enterococcus. Susceptibility    Vancomycin resistant enterococcus isolated    MIC    AMPICILLIN >=32 RESIST... Resistant    GENTAMICIN SYNERGY SENSITIVE  Sensitive    LINEZOLID 2 SENSITIVE  Sensitive    VANCOMYCIN >=32 RESIST... Resistant     Plan:  - Spoke with Dr. Servando Snare to change his therapy to Linezolid and he is in agreement.    - Because this requires ID review - will ask ID to sign off on this recommendation (Dr. Johnnye Sima approved)  - Will f/u clinical response to therapy  Height: 6' 0.01" (182.9 cm) Weight: 223 lb 5.2 oz (101.3 kg) IBW/kg (Calculated) : 77.62  Temp (24hrs), Avg:98.6 F (37 C), Min:98.4 F (36.9 C), Max:98.7 F (37.1 C)  Recent Labs  Lab 04/17/18 1549 04/18/18 1941 04/20/18 0928 04/20/18 2131 04/23/18 0343  WBC 9.2 10.2 9.9  --  7.5  CREATININE 1.47* 1.37* 1.36*  --  1.53*  VANCOTROUGH  --   --   --  27*  --     Estimated Creatinine Clearance: 67.2 mL/min (A) (by C-G formula based on SCr of 1.53 mg/dL (H)).    Allergies  Allergen Reactions  . Demeclocycline Swelling    SWELLING REACTION UNSPECIFIED   . Tetracyclines & Related Swelling    SWELLING REACTION UNSPECIFIED    Antimicrobials this admission: Vanc 5/14 >> 5/19 Zosyn 5/14 >> 5/19 Linezolid 5/19>>  Dose adjustments this admission: Vanc 1250 Q12 > trough 27 > vanc 750 Q12  Microbiology results: 5/15 R chest tissue > Vancomycin resistant enterococcus isolated   Rober Minion, PharmD., MS Clinical Pharmacist Pager:  [phone] Thank you for allowing pharmacy to be part of this patients care team. 04/23/2018 11:35 AM

## 2018-04-23 NOTE — Progress Notes (Signed)
New PCA Fentanyl syringe administered.  65mL wasted in sink from syringe removed.  Witnessed by Rufina Falco RN

## 2018-04-24 ENCOUNTER — Inpatient Hospital Stay (HOSPITAL_COMMUNITY): Payer: Medicare Other | Admitting: Certified Registered Nurse Anesthetist

## 2018-04-24 ENCOUNTER — Encounter (HOSPITAL_COMMUNITY)
Admission: AD | Disposition: A | Discharge status: Home Health | Payer: Self-pay | Source: Home / Self Care | Attending: Thoracic Surgery (Cardiothoracic Vascular Surgery)

## 2018-04-24 HISTORY — PX: APPLICATION OF WOUND VAC: SHX5189

## 2018-04-24 HISTORY — PX: VIDEO ASSISTED THORACOSCOPY: SHX5073

## 2018-04-24 LAB — AEROBIC/ANAEROBIC CULTURE W GRAM STAIN (SURGICAL/DEEP WOUND)

## 2018-04-24 LAB — AEROBIC/ANAEROBIC CULTURE (SURGICAL/DEEP WOUND)

## 2018-04-24 SURGERY — APPLICATION, WOUND VAC
Anesthesia: General | Site: Chest | Laterality: Right

## 2018-04-24 MED ORDER — ONDANSETRON HCL 4 MG/2ML IJ SOLN: 4.0000 mg | Freq: Once | INTRAMUSCULAR | Status: DC | PRN

## 2018-04-24 MED ORDER — ROCURONIUM BROMIDE 10 MG/ML (PF) SYRINGE
PREFILLED_SYRINGE | INTRAVENOUS | Status: AC
  Filled 2018-04-24: qty 5

## 2018-04-24 MED ORDER — SUGAMMADEX SODIUM 200 MG/2ML IV SOLN
INTRAVENOUS | Status: DC | PRN
  Administered 2018-04-24: 201.8 mg via INTRAVENOUS

## 2018-04-24 MED ORDER — LIDOCAINE 2% (20 MG/ML) 5 ML SYRINGE
INTRAMUSCULAR | Status: AC
  Filled 2018-04-24: qty 5

## 2018-04-24 MED ORDER — 0.9 % SODIUM CHLORIDE (POUR BTL) OPTIME
TOPICAL | Status: DC | PRN
  Administered 2018-04-24: 2000 mL

## 2018-04-24 MED ORDER — PHENYLEPHRINE 40 MCG/ML (10ML) SYRINGE FOR IV PUSH (FOR BLOOD PRESSURE SUPPORT)
PREFILLED_SYRINGE | INTRAVENOUS | Status: AC
  Filled 2018-04-24: qty 10

## 2018-04-24 MED ORDER — PROPOFOL 10 MG/ML IV BOLUS
INTRAVENOUS | Status: AC
  Filled 2018-04-24: qty 20

## 2018-04-24 MED ORDER — FENTANYL CITRATE (PF) 100 MCG/2ML IJ SOLN
25.0000 ug | INTRAMUSCULAR | Status: DC | PRN
  Administered 2018-04-24 (×2): 50 ug via INTRAVENOUS

## 2018-04-24 MED ORDER — LACTATED RINGERS IV SOLN
Freq: Once | INTRAVENOUS | Status: AC
  Administered 2018-04-24: 09:00:00 via INTRAVENOUS

## 2018-04-24 MED ORDER — LIDOCAINE HCL (CARDIAC) PF 100 MG/5ML IV SOSY
PREFILLED_SYRINGE | INTRAVENOUS | Status: DC | PRN
  Administered 2018-04-24: 60 mg via INTRAVENOUS

## 2018-04-24 MED ORDER — DEXAMETHASONE SODIUM PHOSPHATE 4 MG/ML IJ SOLN
INTRAMUSCULAR | Status: DC | PRN
  Administered 2018-04-24: 5 mg via INTRAVENOUS

## 2018-04-24 MED ORDER — FENTANYL CITRATE (PF) 250 MCG/5ML IJ SOLN
INTRAMUSCULAR | Status: DC | PRN
  Administered 2018-04-24 (×3): 50 ug via INTRAVENOUS

## 2018-04-24 MED ORDER — ROCURONIUM BROMIDE 100 MG/10ML IV SOLN
INTRAVENOUS | Status: DC | PRN
  Administered 2018-04-24: 50 mg via INTRAVENOUS
  Administered 2018-04-24: 10 mg via INTRAVENOUS
  Administered 2018-04-24: 20 mg via INTRAVENOUS

## 2018-04-24 MED ORDER — PHENYLEPHRINE HCL 10 MG/ML IJ SOLN
INTRAMUSCULAR | Status: DC | PRN
  Administered 2018-04-24 (×2): 80 ug via INTRAVENOUS

## 2018-04-24 MED ORDER — EPHEDRINE SULFATE 50 MG/ML IJ SOLN
INTRAMUSCULAR | Status: AC
  Filled 2018-04-24: qty 1

## 2018-04-24 MED ORDER — PROPOFOL 10 MG/ML IV BOLUS
INTRAVENOUS | Status: DC | PRN
  Administered 2018-04-24: 150 mg via INTRAVENOUS

## 2018-04-24 MED ORDER — ONDANSETRON HCL 4 MG/2ML IJ SOLN
INTRAMUSCULAR | Status: DC | PRN
  Administered 2018-04-24: 4 mg via INTRAVENOUS

## 2018-04-24 MED ORDER — ONDANSETRON HCL 4 MG/2ML IJ SOLN
INTRAMUSCULAR | Status: AC
  Filled 2018-04-24: qty 2

## 2018-04-24 MED ORDER — DEXTROSE 5 % IV SOLN
INTRAVENOUS | Status: DC | PRN
  Administered 2018-04-24: 25 ug/min via INTRAVENOUS

## 2018-04-24 MED ORDER — SUGAMMADEX SODIUM 200 MG/2ML IV SOLN
INTRAVENOUS | Status: AC
  Filled 2018-04-24: qty 2

## 2018-04-24 MED ORDER — DEXAMETHASONE SODIUM PHOSPHATE 10 MG/ML IJ SOLN
INTRAMUSCULAR | Status: AC
  Filled 2018-04-24: qty 1

## 2018-04-24 MED ORDER — FENTANYL CITRATE (PF) 250 MCG/5ML IJ SOLN
INTRAMUSCULAR | Status: AC
  Filled 2018-04-24: qty 5

## 2018-04-24 MED ORDER — MIDAZOLAM HCL 2 MG/2ML IJ SOLN
INTRAMUSCULAR | Status: AC
  Filled 2018-04-24: qty 2

## 2018-04-24 MED ORDER — FENTANYL CITRATE (PF) 100 MCG/2ML IJ SOLN
INTRAMUSCULAR | Status: AC
  Administered 2018-04-24: 50 ug via INTRAVENOUS
  Filled 2018-04-24: qty 2

## 2018-04-24 MED ORDER — LACTATED RINGERS IV SOLN
INTRAVENOUS | Status: DC | PRN
  Administered 2018-04-24: 10:00:00 via INTRAVENOUS

## 2018-04-24 SURGICAL SUPPLY — 76 items
APPLIER CLIP ROT 10 11.4 M/L (STAPLE)
CANISTER SUCT 3000ML PPV (MISCELLANEOUS) ×5 IMPLANT
CATH KIT ON Q 5IN SLV (PAIN MANAGEMENT) IMPLANT
CATH THORACIC 28FR (CATHETERS) IMPLANT
CATH THORACIC 28FR RT ANG (CATHETERS) IMPLANT
CATH THORACIC 36FR (CATHETERS) IMPLANT
CATH THORACIC 36FR RT ANG (CATHETERS) IMPLANT
CLIP APPLIE ROT 10 11.4 M/L (STAPLE) IMPLANT
CLIP VESOCCLUDE MED 6/CT (CLIP) IMPLANT
CONN Y 3/8X3/8X3/8  BEN (MISCELLANEOUS) ×2
CONN Y 3/8X3/8X3/8 BEN (MISCELLANEOUS) ×3 IMPLANT
CONT SPEC 4OZ CLIKSEAL STRL BL (MISCELLANEOUS) ×13 IMPLANT
COVER SURGICAL LIGHT HANDLE (MISCELLANEOUS) ×5 IMPLANT
DERMABOND ADVANCED (GAUZE/BANDAGES/DRESSINGS)
DERMABOND ADVANCED .7 DNX12 (GAUZE/BANDAGES/DRESSINGS) IMPLANT
DRAIN CHANNEL 28F RND 3/8 FF (WOUND CARE) IMPLANT
DRAIN CHANNEL 32F RND 10.7 FF (WOUND CARE) ×3 IMPLANT
DRAPE LAPAROSCOPIC ABDOMINAL (DRAPES) ×5 IMPLANT
DRAPE SLUSH/WARMER DISC (DRAPES) ×5 IMPLANT
DRSG VAC ATS MED SENSATRAC (GAUZE/BANDAGES/DRESSINGS) ×3 IMPLANT
ELECT BLADE 6.5 EXT (BLADE) ×3 IMPLANT
ELECT REM PT RETURN 9FT ADLT (ELECTROSURGICAL) ×5
ELECTRODE REM PT RTRN 9FT ADLT (ELECTROSURGICAL) ×3 IMPLANT
GAUZE SPONGE 4X4 12PLY STRL (GAUZE/BANDAGES/DRESSINGS) ×5 IMPLANT
GLOVE BIO SURGEON STRL SZ 6.5 (GLOVE) ×2 IMPLANT
GLOVE BIO SURGEONS STRL SZ 6.5 (GLOVE) ×1
GLOVE BIOGEL PI IND STRL 6.5 (GLOVE) ×1 IMPLANT
GLOVE BIOGEL PI INDICATOR 6.5 (GLOVE) ×2
GLOVE SURG SIGNA 7.5 PF LTX (GLOVE) ×13 IMPLANT
GOWN STRL REUS W/ TWL LRG LVL3 (GOWN DISPOSABLE) ×8 IMPLANT
GOWN STRL REUS W/ TWL XL LVL3 (GOWN DISPOSABLE) ×4 IMPLANT
GOWN STRL REUS W/TWL LRG LVL3 (GOWN DISPOSABLE) ×8
GOWN STRL REUS W/TWL XL LVL3 (GOWN DISPOSABLE) ×4
HEMOSTAT SURGICEL 2X14 (HEMOSTASIS) IMPLANT
IV CATH 22GX1 FEP (IV SOLUTION) IMPLANT
KIT BASIN OR (CUSTOM PROCEDURE TRAY) ×5 IMPLANT
KIT DRAINAGE VACCUM ASSIST (KITS) ×3 IMPLANT
KIT SUCTION CATH 14FR (SUCTIONS) ×5 IMPLANT
KIT TURNOVER KIT B (KITS) ×5 IMPLANT
NS IRRIG 1000ML POUR BTL (IV SOLUTION) ×10 IMPLANT
PACK CHEST (CUSTOM PROCEDURE TRAY) ×5 IMPLANT
PAD ARMBOARD 7.5X6 YLW CONV (MISCELLANEOUS) ×10 IMPLANT
POUCH ENDO CATCH II 15MM (MISCELLANEOUS) IMPLANT
POUCH SPECIMEN RETRIEVAL 10MM (ENDOMECHANICALS) IMPLANT
SEALANT PROGEL (MISCELLANEOUS) IMPLANT
SEALANT SURG COSEAL 4ML (VASCULAR PRODUCTS) IMPLANT
SEALANT SURG COSEAL 8ML (VASCULAR PRODUCTS) IMPLANT
SOLUTION ANTI FOG 6CC (MISCELLANEOUS) ×5 IMPLANT
SPECIMEN JAR MEDIUM (MISCELLANEOUS) ×5 IMPLANT
SPONGE INTESTINAL PEANUT (DISPOSABLE) IMPLANT
SPONGE TONSIL 1 RF SGL (DISPOSABLE) ×5 IMPLANT
SUT ETHILON 1 TP 1 60 (SUTURE) ×3 IMPLANT
SUT PROLENE 4 0 RB 1 (SUTURE)
SUT PROLENE 4-0 RB1 .5 CRCL 36 (SUTURE) IMPLANT
SUT SILK  1 MH (SUTURE) ×2
SUT SILK 1 MH (SUTURE) ×5 IMPLANT
SUT SILK 2 0SH CR/8 30 (SUTURE) IMPLANT
SUT SILK 3 0SH CR/8 30 (SUTURE) IMPLANT
SUT VIC AB 1 CTX 36 (SUTURE)
SUT VIC AB 1 CTX36XBRD ANBCTR (SUTURE) IMPLANT
SUT VIC AB 2-0 CTX 36 (SUTURE) IMPLANT
SUT VIC AB 2-0 UR6 27 (SUTURE) IMPLANT
SUT VIC AB 3-0 MH 27 (SUTURE) IMPLANT
SUT VIC AB 3-0 X1 27 (SUTURE) ×5 IMPLANT
SUT VICRYL 2 TP 1 (SUTURE) IMPLANT
SYR 20CC LL (SYRINGE) IMPLANT
SYSTEM SAHARA CHEST DRAIN ATS (WOUND CARE) ×5 IMPLANT
TAPE CLOTH 4X10 WHT NS (GAUZE/BANDAGES/DRESSINGS) ×5 IMPLANT
TIP APPLICATOR SPRAY EXTEND 16 (VASCULAR PRODUCTS) IMPLANT
TOWEL GREEN STERILE (TOWEL DISPOSABLE) ×5 IMPLANT
TOWEL GREEN STERILE FF (TOWEL DISPOSABLE) ×5 IMPLANT
TRAY FOLEY MTR SLVR 16FR STAT (SET/KITS/TRAYS/PACK) ×5 IMPLANT
TROCAR XCEL BLADELESS 5X75MML (TROCAR) ×5 IMPLANT
TROCAR XCEL NON-BLD 5MMX100MML (ENDOMECHANICALS) IMPLANT
TUNNELER SHEATH ON-Q 11GX8 DSP (PAIN MANAGEMENT) IMPLANT
WATER STERILE IRR 1000ML POUR (IV SOLUTION) ×10 IMPLANT

## 2018-04-24 NOTE — Anesthesia Postprocedure Evaluation (Signed)
Anesthesia Post Note  Patient: Erik [name]  Procedure(s) Performed: WOUND VAC CHANGE (N/A ) VIDEO ASSISTED THORACOSCOPY (Right Chest)     Patient location during evaluation: PACU Anesthesia Type: General Level of consciousness: awake and alert Pain management: pain level controlled Vital Signs Assessment: post-procedure vital signs reviewed and stable Respiratory status: spontaneous breathing, nonlabored ventilation, respiratory function stable and patient connected to nasal cannula oxygen Cardiovascular status: blood pressure returned to baseline and stable Postop Assessment: no apparent nausea or vomiting Anesthetic complications: no    Last Vitals:  Vitals:   04/24/18 1300 04/24/18 1340  BP:    Pulse:    Resp: 12 19  Temp: 36.9 C   SpO2: (!) 3%     Last Pain:  Vitals:   04/24/18 1340  TempSrc:   PainSc: 4                  Tiajuana Amass

## 2018-04-24 NOTE — Interval H&P Note (Signed)
History and Physical Interval Note:  04/24/2018 9:28 AM  Erik [name]  has presented today for surgery, with the diagnosis of WOUND INFECTION  The various methods of treatment have been discussed with the patient and family. After consideration of risks, benefits and other options for treatment, the patient has consented to  Procedure(s): WOUND VAC CHANGE (N/A) VIDEO ASSISTED THORACOSCOPY (Right) RIB RESECTION (Right) as a surgical intervention .  The patient's history has been reviewed, patient examined, no change in status, stable for surgery.  I have reviewed the patient's chart and labs.  Questions were answered to the patient's satisfaction.     [name]

## 2018-04-24 NOTE — Transfer of Care (Signed)
Immediate Anesthesia Transfer of Care Note  Patient: Erik [name]  Procedure(s) Performed: WOUND VAC CHANGE (N/A ) VIDEO ASSISTED THORACOSCOPY (Right Chest)  Patient Location: PACU  Anesthesia Type:General  Level of Consciousness: awake, alert , patient cooperative and responds to stimulation  Airway & Oxygen Therapy: Patient Spontanous Breathing and Patient connected to face mask oxygen  Post-op Assessment: Report given to RN, Post -op Vital signs reviewed and stable and Patient moving all extremities X 4  Post vital signs: Reviewed and stable  Last Vitals:  Vitals Value Taken Time  BP 154/114 04/24/2018 11:44 AM  Temp    Pulse 100 04/24/2018 11:46 AM  Resp 18 04/24/2018 11:46 AM  SpO2 95 % 04/24/2018 11:46 AM  Vitals shown include unvalidated device data.  Last Pain:  Vitals:   04/24/18 0433  TempSrc: Oral  PainSc:       Patients Stated Pain Goal: 0 (53/61/44 3154)  Complications: No apparent anesthesia complications

## 2018-04-24 NOTE — Anesthesia Preprocedure Evaluation (Signed)
Anesthesia Evaluation  Patient identified by MRN, date of birth, ID band Patient awake    Reviewed: Allergy & Precautions, NPO status , Patient's Chart, lab work & pertinent test results  History of Anesthesia Complications Negative for: history of anesthetic complications  Airway Mallampati: II  TM Distance: >3 FB Neck ROM: Full    Dental  (+) Edentulous Upper, Missing, Poor Dentition, Dental Advisory Given   Pulmonary shortness of breath, asthma , pneumonia, COPD, Current Smoker, former smoker,  Pneumonia with empyema and lung CA and chronic pleural effusion    + wheezing      Cardiovascular hypertension, Pt. on medications Normal cardiovascular exam  03/2018: Left ventricle: Poor acousitic windows No apical window Overall  LVEF is probably mildly depressed Recomm limited echo with  contrast to see if helps in evaluation. The cavity size was  normal. Wall thickness was increased in a pattern of mild LVH. - Left atrium: The atrium was mildly dilated.   Neuro/Psych PSYCHIATRIC DISORDERS Anxiety Depression negative neurological ROS     GI/Hepatic Neg liver ROS, GERD  Medicated,  Endo/Other  negative endocrine ROS  Renal/GU negative Renal ROS     Musculoskeletal  (+) Arthritis ,   Abdominal   Peds  Hematology  (+) anemia ,   Anesthesia Other Findings   Reproductive/Obstetrics                             Lab Results  Component Value Date   WBC 7.5 04/23/2018   HGB 8.9 (L) 04/23/2018   HCT 29.6 (L) 04/23/2018   MCV 95.8 04/23/2018   PLT 235 04/23/2018   Lab Results  Component Value Date   CREATININE 1.53 (H) 04/23/2018   BUN 11 04/23/2018   NA 141 04/23/2018   K 3.5 04/23/2018   CL 103 04/23/2018   CO2 30 04/23/2018    Anesthesia Physical  Anesthesia Plan  ASA: III  Anesthesia Plan: General   Post-op Pain Management:    Induction: Intravenous  PONV Risk Score and Plan: 1  and Ondansetron, Dexamethasone and Treatment may vary due to age or medical condition  Airway Management Planned: Oral ETT  Additional Equipment:   Intra-op Plan:   Post-operative Plan: Extubation in OR  Informed Consent: I have reviewed the patients History and Physical, chart, labs and discussed the procedure including the risks, benefits and alternatives for the proposed anesthesia with the patient or authorized representative who has indicated his/her understanding and acceptance.   Dental advisory given  Plan Discussed with: CRNA, Anesthesiologist and Surgeon  Anesthesia Plan Comments:         Anesthesia Quick Evaluation

## 2018-04-24 NOTE — Plan of Care (Signed)
  Problem: Education: Goal: Knowledge of General Education information will improve Outcome: Progressing   Problem: Health Behavior/Discharge Planning: Goal: Ability to manage health-related needs will improve Outcome: Progressing   Problem: Safety: Goal: Ability to remain free from injury will improve Outcome: Progressing

## 2018-04-24 NOTE — Anesthesia Procedure Notes (Signed)
Procedure Name: Intubation Date/Time: 04/24/2018 10:00 AM Performed by: Glynda Jaeger, CRNA Pre-anesthesia Checklist: Patient identified, Patient being monitored, Timeout performed, Emergency Drugs available and Suction available Patient Re-evaluated:Patient Re-evaluated prior to induction Oxygen Delivery Method: Circle System Utilized Preoxygenation: Pre-oxygenation with 100% oxygen Induction Type: IV induction Ventilation: Mask ventilation without difficulty Laryngoscope Size: Mac and 4 Grade View: Grade I Tube type: Oral Tube size: 8.5 mm Number of attempts: 1 Airway Equipment and Method: Stylet Placement Confirmation: ETT inserted through vocal cords under direct vision,  positive ETCO2 and breath sounds checked- equal and bilateral Secured at: 22 cm Tube secured with: Tape Dental Injury: Teeth and Oropharynx as per pre-operative assessment

## 2018-04-24 NOTE — Brief Op Note (Addendum)
04/18/2018 - 04/24/2018  11:23 AM  PATIENT:  Erik [name]  57 y.o. male  PRE-OPERATIVE DIAGNOSIS:  WOUND INFECTION  POST-OPERATIVE DIAGNOSIS:  WOUND INFECTION  PROCEDURE:  Procedure(s): WOUND VAC CHANGE (N/A) VIDEO ASSISTED THORACOSCOPY (Right)  SURGEON:  Surgeon(s) and Role:    * [name], MD - Primary  PHYSICIAN ASSISTANT: Ellwood Handler PA-C  ANESTHESIA:   general  EBL: None  BLOOD ADMINISTERED:none  DRAINS: 32 Blake Drain   LOCAL MEDICATIONS USED:  NONE  SPECIMEN:  No Specimen  DISPOSITION OF SPECIMEN:  N/A  COUNTS:  YES  TOURNIQUET:  * No tourniquets in log *  DICTATION: .Dragon Dictation  PLAN OF CARE: Return to 4E  PATIENT DISPOSITION:  PACU - hemodynamically stable.   Delay start of Pharmacological VTE agent (>24hrs) due to surgical blood loss or risk of bleeding: no

## 2018-04-24 NOTE — Consult Note (Signed)
[place] Nurse wound consult note Reason for Consult: VAC change to start 3/22 with assistance of [place] nurse Consultation order noted.  Will follow beginning 3/22. Thank you for the consult.   Val Riles, RN, MSN, CWOCN, CNS-BC, pager [phone]

## 2018-04-24 NOTE — Progress Notes (Signed)
Wasted 21 ml's of Fentanyl PCA syringe in sharps with Overton Mam , RN as witness.

## 2018-04-24 NOTE — Op Note (Signed)
NAME: JOHNIE, MAKKI MEDICAL RECORD [id number] ACCOUNT [id number] DATE OF BIRTH:[birth year] FACILITY: [facility] LOCATION: [facility] PHYSICIAN:Edwina Grossberg Chaya Jan, MD  OPERATIVE REPORT  DATE OF PROCEDURE:  04/24/2018  PREOPERATIVE DIAGNOSIS:  Right empyema with wound infection.  POSTOPERATIVE DIAGNOSIS:  Right empyema with wound infection.  PROCEDURE:    Right video-assisted thoracoscopy.  Wound VAC change.  SURGEON:  Modesto Charon, MD  ASSISTANT:  Ellwood Handler, PA-C  ANESTHESIA:  General.  FINDINGS:  Serosanguineous fluid loculated in pleural space.  Space appeared slightly smaller.  Wound clean and granulating over entire surface.  CLINICAL NOTE:  Mr. Bonner is a 57 year old gentleman with a complex medical history including advanced stage lung cancer, status post chemo and radiation.  He presented with a right empyema in the setting of a chronic pleural effusion.  He underwent  drainage of the empyema and attempted decortication but had incomplete reexpansion of the lung.  He returned with air fluid levels in both the pleural space and the chest wall and was taken back to the operating room for incision and drainage of the  chest wound and placement of a chest tube and wound VAC on 04/19/2018.  He now returns for a wound VAC change under general anesthesia and right VATS to assess the pleural space.  The indications, risks, benefits, and alternatives were discussed in  detail with the patient.  He understood and accepted the risks and agreed to proceed.  OPERATIVE NOTE:  The patient was brought to the operating room on 04/24/2018.  He had induction of general anesthesia and was intubated.  Sequential compression devices were in place for DVT prophylaxis.  He was receiving intravenous Zyvox, and that was  dosed at the scheduled time.  He was placed in a left lateral decubitus position, and the right chest was prepped and draped in the usual sterile fashion.  The chest tube and wound  VAC sponge were removed prior to prepping.  After performing a timeout, the wound was inspected.  It was clean and granulating.  A port was inserted through the site where the chest tube exited the chest, and the thoracoscope was advanced into the chest.  There was some fluid and loculations within  the chest.  These were broken up with the scope and with a sucker.  There was serosanguineous fluid, but no purulent material was present.  The chest was copiously irrigated through this incision with saline.  The wound was also copiously irrigated with  saline.  Because of irritation from the sutures around the chest tube site, the more anterior port incision that had been a site for a chest tube previously was reopened, and a new 32-French Blake drain was placed through this site and secured with a #1  silk suture.  The VAC sponge was placed into the original chest tube site more posteriorly, as well as into the wound.  The chest tube was placed to suction.  The wound VAC was applied, and suction was initiated at 75 mmHg.  The patient was extubated in  the operating room and taken to the [facility] [place] [facility] in good condition.  LN/NUANCE  D:04/24/2018 T:04/24/2018 JOB:[id number]

## 2018-04-24 NOTE — Anesthesia Procedure Notes (Signed)
Arterial Line Insertion Start/End5/20/2019 9:00 AM, 04/24/2018 9:10 AM Performed by: Lance Coon, CRNA, CRNA  Patient location: Pre-op. Preanesthetic checklist: patient identified, IV checked, site marked, risks and benefits discussed, surgical consent, monitors and equipment checked, pre-op evaluation, timeout performed and anesthesia consent Lidocaine 1% used for infiltration Right, radial was placed Catheter size: 20 G Hand hygiene performed , maximum sterile barriers used  and Seldinger technique used  Attempts: 2 Procedure performed without using ultrasound guided technique. Following insertion, dressing applied and Biopatch. Post procedure assessment: normal  Patient tolerated the procedure well with no immediate complications.

## 2018-04-25 ENCOUNTER — Inpatient Hospital Stay (HOSPITAL_COMMUNITY): Payer: Medicare Other

## 2018-04-25 ENCOUNTER — Encounter (HOSPITAL_COMMUNITY): Payer: Self-pay | Admitting: Thoracic Surgery (Cardiothoracic Vascular Surgery)

## 2018-04-25 DIAGNOSIS — A491 Streptococcal infection, unspecified site: Secondary | ICD-10-CM

## 2018-04-25 DIAGNOSIS — Z1621 Resistance to vancomycin: Secondary | ICD-10-CM

## 2018-04-25 MED ORDER — ENOXAPARIN SODIUM 100 MG/ML ~~LOC~~ SOLN
1.0000 mg/kg | Freq: Two times a day (BID) | SUBCUTANEOUS | Status: DC
Start: 2018-04-25 — End: 2018-05-01
  Administered 2018-04-25 – 2018-05-01 (×13): 100 mg via SUBCUTANEOUS
  Filled 2018-04-25 (×13): qty 1

## 2018-04-25 NOTE — Discharge Summary (Addendum)
Physician Discharge Summary  Patient ID: Erik [name] MRN: [id number] DOB/AGE: [birth year] 57 y.o.  Admit date: 04/18/2018 Discharge date: 05/02/2018  Admission Diagnoses:  Patient Active Problem List   Diagnosis Date Noted  . Empyema ([place])   . Palliative care by specialist   . VRE (vancomycin-resistant Enterococci) infection 04/25/2018  . Wound infection 04/18/2018  . Malnutrition of moderate degree 03/30/2018  . Sepsis due to pneumonia ([place]) 03/23/2018  . Empyema of lung ([place]) 03/23/2018  . History of pulmonary embolism 03/23/2018  . Empyema lung ([place]) 03/23/2018  . Bronchiectasis with acute exacerbation ([place])   . Acute on chronic respiratory failure with hypoxia ([place])   . Essential hypertension 03/07/2018  . Steroid-induced hyperglycemia 03/07/2018  . COPD (chronic obstructive pulmonary disease) ([place]) 03/06/2018  . HCAP (healthcare-associated pneumonia) 01/28/2018  . Taking multiple medications for chronic disease 03/08/2017  . Radiation-induced esophagitis 02/02/2017  . Loss of weight 12/10/2016  . Goals of care, counseling/discussion 12/10/2016  . Esophageal dysphagia 12/10/2016  . Abdominal pain, epigastric 12/10/2016  . Adenocarcinoma of right lung ([place]) 07/23/2016  . GERD 01/13/2009  . ACTINIC KERATOSIS 01/13/2009  . Anxiety state 12/27/2008  . Depression with anxiety 12/27/2008  . ASTHMA 12/27/2008  . OSTEOARTHRITIS 12/27/2008  . LOW BACK PAIN 12/27/2008   Discharge Diagnoses:   Patient Active Problem List   Diagnosis Date Noted  . Wound infection 04/18/2018  . Malnutrition of moderate degree 03/30/2018  . Sepsis due to pneumonia ([place]) 03/23/2018  . Empyema of lung ([place]) 03/23/2018  . History of pulmonary embolism 03/23/2018  . Empyema lung ([place]) 03/23/2018  . Bronchiectasis with acute exacerbation ([place])   . Acute on chronic respiratory failure with hypoxia ([place])   . Essential hypertension 03/07/2018  . Steroid-induced hyperglycemia 03/07/2018  . COPD  (chronic obstructive pulmonary disease) ([place]) 03/06/2018  . HCAP (healthcare-associated pneumonia) 01/28/2018  . Taking multiple medications for chronic disease 03/08/2017  . Radiation-induced esophagitis 02/02/2017  . Loss of weight 12/10/2016  . Encounter for screening colonoscopy 12/10/2016  . Esophageal dysphagia 12/10/2016  . Abdominal pain, epigastric 12/10/2016  . Adenocarcinoma of right lung ([place]) 07/23/2016  . GERD 01/13/2009  . ACTINIC KERATOSIS 01/13/2009  . Anxiety state 12/27/2008  . Depression with anxiety 12/27/2008  . ASTHMA 12/27/2008  . OSTEOARTHRITIS 12/27/2008  . LOW BACK PAIN 12/27/2008   Discharged Condition: fair  History of Present Illness:  [name] is a 57 yo obese male well known to [facility].  He was originally diagnosed with Stage III lung cancer in 2017, COPD with hypoxic respiratory failure, DVT, chronic cancer related pain, Hypertension, depression and anxiety.  In April of this year he presented to the ED with complaints of shortness of breath and right side pleuritic chest pain.  On exam he was febrile and hypoxic.  CXR obtained showed air fluid levels in the right hemithorax.  CT scan of the chest showed large empyema in the right pleural space.  He underwent a right VATS with decortication by Dr. Roxan [name] 03/28/2018.  His hospital stay was complicated by persistent air leak which did resolve prior to discharge on 04/08/2018.  He was given a course of Augmentin at that time.  The patient has felt fine since discharge.  He reported for follow up with Dr. Roxan [name] on 04/18/2018 at which time he was noted to be febrile at 101.1 and tachycardic.  CXR was obtained and showed increasing collection of air in the right lung base associated with air fluid levels, and increasing subcutaneous emphysema  of the right chest wall.  It was felt the patient would require admission with IV antibiotics.  Hospital Course:   Upon arrival the patient was stable.  It was felt  surgical exploration would be indicated for further diagnosis and treatment of empyema.  The risks and benefits of the procedure were explained to the patient and he was agreeable to proceed.  He was taken to the operating room on 04/19/2018.  He underwent Irrigation and debridement of right chest wound with culture of wound, chest tube insertion, and application of wound vac.  He tolerated the procedure without difficulty, was extubated and taken to the PACU in stable condition.  The patient's chest tube was free from air leak on POD #1.  He was taken back to the OR on 04/21/2018 for wound vac change, Video Assisted Thoracoscopy, and chest tube placement.  He again tolerated the procedure without difficulty and was taken to the PACU in stable condition.  He remained clinically stable.  He was treated with broad spectrum antibiotics.  He underwent midline placement for prolonged antibiotics.  He was taken back to the OR on 04/24/2018 for wound vac change and repeat chest tube placement.  He again tolerated the procedure without difficulty and was taken to the PACU in stable condition.  He continues to progress.  He has been started on weight based lovenox therapy due to his history of DVT.  He will be transitioned to home Eliquis prior to discharge.  His wound vac underwent changes every MWF under the wound care team.  His chest tube output remains quite high.  There has been no air leak present.  His chest tubes will be converted to Empyema tubes prior to discharge and will likely remain in place for long period of time.  Wound culture from the OR was positive Vancomycin Resistant Enterococcus.  He was taken of Vancomycin/Zosyn and started on Linezolid per sensitivities.  He remained in the hospital for several wound vac changes which he tolerated without difficulty.  He was treated with IV Linezolid until discharge and he will require oral regimen until 06/04/2018.  He will be discharged with his chest tube hooked up  to a Mini Express and will need to record the drainage.  He is hopeful to resume chemotherapy in the future. He will need to follow up with Oncology to see if this is appropriate.  He is ambulating without issues.  Home health has been arranged.  He is medically stable for discharge home today.  Treatments: surgery, IV ABX, Wound vac changes  WOC Nurse wound follow up Wound type:Scheduled NPWT dressing change to right lateral chest Measurement:Per Wednesday, refer to previous progress notes Wound bed:red, moist Drainage (amount, consistency, odor)mod amt serous drainage in cannister Periwound:Clear, area of medical adhesive related skin injury (MARSI) to 2 o'clock measuring 0.4cm x 3cm which is a pink moist partial thickness wound Dressing procedure/placement/frequency: NPWT dressing removed and 5 pieces of black foam removedfrom wound bed and three defects. Wound cleansed with NS. Assisted by bedside RN today. Black foam strips inserted into the two areas in the superior wound and the wound near the chest tube. Two wounds connected via a foam bridge for a total of 4 pieces of black foam used in dressing today.Chest tube located next to the wounds is leaking mod amt yellow drainage; applied barrier ring to wound edges to attempt to maintain a seal. Drape used to cover dressings and wound. TRAC pad applied and an immediate seal achieved  at 61mmHg continuous negative pressure. Patient was premedicated and tolerated with minimal amt pain.WOC will plan to change dressing on Wed if patient is still in the hospital at that time. Julien Girt MSN, RN, CWOCN, CWCN-AP, CNS  PROCEDURE:        Right video-assisted thoracoscopy.      Wound VAC change.   Discharge Exam: Blood pressure (!) 138/91, pulse (!) 106, temperature 99 F (37.2 C), temperature source Oral, resp. rate 18, height 6' 0.01" (1.829 m), weight 213 lb (96.6 kg), SpO2 93 %.  General appearance: alert, cooperative and no  distress Heart: regular rate and rhythm Lungs: diminished breath sounds bibasilar Abdomen: soft, non-tender; bowel sounds normal; no masses,  no organomegaly Extremities: extremities normal, atraumatic, no cyanosis or edema Wound: wound vac in place  Disposition:  Home  Discharge Medications:    Allergies as of 05/01/2018      Reactions   Demeclocycline Swelling   SWELLING REACTION UNSPECIFIED    Tetracyclines & Related Swelling   SWELLING REACTION UNSPECIFIED      Medication List    STOP taking these medications   amoxicillin-clavulanate 500-125 MG tablet Commonly known as:  AUGMENTIN   escitalopram 20 MG tablet Commonly known as:  LEXAPRO   TECENTRIQ IV     TAKE these medications   acetaminophen 325 MG tablet Commonly known as:  TYLENOL Take 2 tablets (650 mg total) by mouth every 6 (six) hours as needed for mild pain (or Fever >/= 101). Notes to patient:  No recent doses, take as ordered.   albuterol (2.5 MG/3ML) 0.083% nebulizer solution Commonly known as:  PROVENTIL Take 3 mLs (2.5 mg total) by nebulization every 6 (six) hours as needed for wheezing or shortness of breath (if unable to experience relief by using combivent inhaler). Notes to patient:  No recent doses, take as ordered.   ALPRAZolam 1 MG tablet Commonly known as:  XANAX Take 1 mg by mouth 2 (two) times daily as needed for anxiety or sleep (takes 1 tablet and bedtime for sleep and 1 dose during the day only if needed). Notes to patient:  Last dose 04/30/2018 @ 9:45pm   apixaban 5 MG Tabs tablet Commonly known as:  ELIQUIS Take 1 tablet (5 mg total) by mouth 2 (two) times daily. Notes to patient:  No recent doses, take as ordered.   gabapentin 600 MG tablet Commonly known as:  NEURONTIN Take 600 mg by mouth 2 (two) times daily.   guaiFENesin 600 MG 12 hr tablet Commonly known as:  MUCINEX Take 600 mg by mouth 2 (two) times daily as needed (for congestion.). Notes to patient:  No recent  doses, take as ordered.   Ipratropium-Albuterol 20-100 MCG/ACT Aers respimat Commonly known as:  COMBIVENT Inhale 1 puff into the lungs every 6 (six) hours as needed for wheezing or shortness of breath. Notes to patient:  No recent doses, take as ordered.   lactulose 10 GM/15ML solution Commonly known as:  CHRONULAC Take 15 mLs (10 g total) by mouth 3 (three) times daily as needed for mild constipation or moderate constipation. Notes to patient:  No recent doses, take as ordered.   levalbuterol 0.63 MG/3ML nebulizer solution Commonly known as:  XOPENEX Take 3 mLs (0.63 mg total) by nebulization every 8 (eight) hours as needed for wheezing or shortness of breath. Notes to patient:  No recent doses, take as ordered.   linezolid 600 MG tablet Commonly known as:  ZYVOX Take 1 tablet (600 mg total)  by mouth 2 (two) times daily.   loperamide 2 MG capsule Commonly known as:  IMODIUM Take 1 capsule (2 mg total) by mouth as needed for diarrhea or loose stools. Notes to patient:  No recent doses, take as ordered.   methadone 10 MG tablet Commonly known as:  DOLOPHINE Take 20 mg by mouth 5 (five) times daily. Takes 2 tablets 5 times daily Notes to patient:  Last dose 05/01/2018 @ 1500   mirtazapine 15 MG tablet Commonly known as:  REMERON Take 1 tablet by mouth at bedtime.   multivitamin with minerals Tabs tablet Take 1 tablet by mouth daily. [place] Notes to patient:  No recent doses, take as ordered.   ondansetron 8 MG tablet Commonly known as:  ZOFRAN TAKE 1 TABLET BY MOUTH EVERY 8 HOURS AS NEEDED FOR NAUSEA AND VOMITING. Notes to patient:  No recent doses, take as ordered.   OXYGEN Inhale 3 L into the lungs.   pantoprazole 40 MG tablet Commonly known as:  PROTONIX TAKE (1) TABLET BY MOUTH TWICE DAILY.   potassium chloride SA 20 MEQ tablet Commonly known as:  K-DUR,KLOR-CON Take 1 tablet (20 mEq total) by mouth daily. Notes to patient:  No recent doses, take as  ordered.   saccharomyces boulardii 250 MG capsule Commonly known as:  FLORASTOR Take 1 capsule (250 mg total) by mouth 2 (two) times daily for 24 days.   SOOTHE XP Soln Apply 2 drops to eye daily. Notes to patient:  No recent doses, take as ordered.   tiZANidine 4 MG tablet Commonly known as:  ZANAFLEX Take 4 mg by mouth every 8 (eight) hours as needed for muscle spasms. Notes to patient:  No recent doses, take as ordered.   varenicline 0.5 MG tablet Commonly known as:  CHANTIX TAKE (2) TABLETS BY MOUTH TWICE DAILY.      Follow-up Information    [facility] Follow up.   Specialty:  [place] Why:  Please change wound vac every MWF, record chest tube output Contact information: [place] [address] [phone]        [facility] Follow up.   Why:  Home wound VAC       [name], MD Follow up.   Specialty:  Cardiothoracic Surgery Why:  Office will contact you with appointment date and time Contact information: [address] [place] [address] [phone]           Signed: Ellwood Handler 05/02/2018, 9:01 PM

## 2018-04-25 NOTE — Progress Notes (Addendum)
      [address]       [place] [address]             [phone]       1 Day Post-Op Procedure(s) (LRB): WOUND VAC CHANGE (N/A) VIDEO ASSISTED THORACOSCOPY (Right)   Subjective:  Erik [name] complains of pain this morning.  Denies N/V.  Tolerating oral intake  Objective: Vital signs in last 24 hours: Temp:  [98 F (36.7 C)-98.6 F (37 C)] 98.4 F (36.9 C) (05/20 1958) Pulse Rate:  [85-106] 85 (05/21 0400) Cardiac Rhythm: Normal sinus rhythm (05/20 2000) Resp:  [12-30] 16 (05/21 0330) BP: (111-156)/(81-114) 111/81 (05/21 0000) SpO2:  [3 %-99 %] 99 % (05/21 0400) Weight:  [218 lb 4.1 oz (99 kg)] 218 lb 4.1 oz (99 kg) (05/21 0400)  Intake/Output from previous day: 05/20 0701 - 05/21 0700 In: 1220 [P.O.:720; I.V.:500] Out: 2515 [Urine:2000; Drains:250; Blood:5; Chest Tube:260]  General appearance: alert, cooperative and no distress Heart: regular rate and rhythm Lungs: diminished breath sounds bibasilar Abdomen: soft, non-tender; bowel sounds normal; no masses,  no organomegaly Extremities: edema trace Wound: wound vac in place  Lab Results: Recent Labs    04/23/18 0343  WBC 7.5  HGB 8.9*  HCT 29.6*  PLT 235   BMET:  Recent Labs    04/23/18 0343  NA 141  K 3.5  CL 103  CO2 30  GLUCOSE 97  BUN 11  CREATININE 1.53*  CALCIUM 7.8*    PT/INR: No results for input(s): LABPROT, INR in the last 72 hours. ABG    Component Value Date/Time   PHART 7.460 (H) 03/29/2018 0308   HCO3 34.1 (H) 03/29/2018 0308   TCO2 36 (H) 03/29/2018 0308   ACIDBASEDEF 4.0 (H) 07/19/2016 1407   O2SAT 97.0 03/29/2018 0308   CBG (last 3)  No results for input(s): GLUCAP in the last 72 hours.  Assessment/Plan: S/P Procedure(s) (LRB): WOUND VAC CHANGE (N/A) VIDEO ASSISTED THORACOSCOPY (Right)  1. Chest tube- 350 cc output since surgery, no air leak, leave on suction 2. ID-Empyema, cultures showing VRE, on Linezolid per sensitivities 3. Wound vac in place at  75 mmHG suction, will plan for bedside change tomorrow, wound care consult has been placed 4. Renal- last creatinine was 1.53, no repeat has been checked, will get BMET in AM 5. Dispo- patient stable, leave chest tube on suction, repeat BMET in AM, CXR pending, continue current care   LOS: 7 days    Ellwood Handler 04/25/2018 Patient seen and examined, agree with above Will try Ct to water seal today VAC change at bedside tomorrow  Remo Lipps C. Roxan Hockey, MD [facility] [phone]

## 2018-04-26 LAB — CBC
HCT: 33.4 % — ABNORMAL LOW (ref 39.0–52.0)
Hemoglobin: 10 g/dL — ABNORMAL LOW (ref 13.0–17.0)
MCH: 29 pg (ref 26.0–34.0)
MCHC: 29.9 g/dL — AB (ref 30.0–36.0)
MCV: 96.8 fL (ref 78.0–100.0)
Platelets: 240 10*3/uL (ref 150–400)
RBC: 3.45 MIL/uL — ABNORMAL LOW (ref 4.22–5.81)
RDW: 16.4 % — AB (ref 11.5–15.5)
WBC: 7.2 10*3/uL (ref 4.0–10.5)

## 2018-04-26 LAB — BASIC METABOLIC PANEL
ANION GAP: 8 (ref 5–15)
BUN: 12 mg/dL (ref 6–20)
CALCIUM: 8.3 mg/dL — AB (ref 8.9–10.3)
CO2: 31 mmol/L (ref 22–32)
Chloride: 107 mmol/L (ref 101–111)
Creatinine, Ser: 1.34 mg/dL — ABNORMAL HIGH (ref 0.61–1.24)
GFR calc Af Amer: >60 mL/min (ref >60)
GFR calc non Af Amer: 58 mL/min — ABNORMAL LOW (ref >60)
GLUCOSE: 89 mg/dL (ref 65–99)
Potassium: 3.9 mmol/L (ref 3.5–5.1)
Sodium: 146 mmol/L — ABNORMAL HIGH (ref 135–145)

## 2018-04-26 MED ORDER — FENTANYL CITRATE (PF) 100 MCG/2ML IJ SOLN: 25.0000 ug | Freq: Once | INTRAMUSCULAR | Status: DC

## 2018-04-26 MED ORDER — FENTANYL CITRATE (PF) 100 MCG/2ML IJ SOLN
25.0000 ug | INTRAMUSCULAR | Status: DC | PRN
  Administered 2018-04-26 (×3): 50 ug via INTRAVENOUS
  Administered 2018-04-27 – 2018-04-28 (×2): 25 ug via INTRAVENOUS
  Administered 2018-04-28 – 2018-05-01 (×4): 50 ug via INTRAVENOUS
  Filled 2018-04-26 (×9): qty 2

## 2018-04-26 MED ORDER — METHADONE HCL 10 MG PO TABS
30.0000 mg | ORAL_TABLET | Freq: Every day | ORAL | Status: DC
  Administered 2018-04-26 – 2018-05-01 (×27): 30 mg via ORAL
  Filled 2018-04-26: qty 6
  Filled 2018-04-26 (×20): qty 3
  Filled 2018-04-26: qty 6
  Filled 2018-04-26 (×5): qty 3

## 2018-04-26 NOTE — Progress Notes (Signed)
DC'd PCA pump per provider orders. PCA pump was running Fentanyl 87mcg/mL. Cleared pump with patient receiving 27mcg of Fentanyl. Wasted 66mL of Fentanyl in the sink with 2nd nurse, Loralee Pacas. Disposed of syringe in the [employer] in the Middle Medication Room.

## 2018-04-26 NOTE — Consult Note (Signed)
[place] Nurse wound consult note Reason for Consult: First NPWT dressing change to right lateral chest. Dr. Roxan Hockey in to see. Notifies this Probation officer that there may be air movement visible in wound bed secondary to chest tube and that this is an expected finding. Wound type: Surgical Pressure Injury POA: N/A Measurement: Superior wound: 3cm x 11cm x 3cm with two areas of depth.  One at 9 o'clock measures 6cm and one at 2 o'clock measures 5cm. Wound bed is red, moist. Inferior wound measures 0.8cm x 2.4cm x 3cm. Wound bed is red, moist. Wound bed: As described above Drainage (amount, consistency, odor) Serous drainage in cannister. Periwound: Intact. Dressing procedure/placement/frequency: I am assisted today by the patient's bedside RN.  FIve (5) pieces of black foam used to obliterate dead space are removed. Patient used PCA X1 prior to procedure. Wound cleansed with NS, patted dry. Periwound skin is protected with drape. Foam placed into inferior wound and into two areas of depth in superior wound, then one piece of foam placed over superior wound. One piece of black foam used to bridge two wounds together and drape applied to secure. TRAC pad applied and system attached to 3mmHg negative pressure.  An immediate seal is achieved. Patient tolerated procedure well.  Next dressing change is due on Friday, 5/24.  [place] nursing team will follow, and will remain available to this patient, the nursing and medical teams.   Thanks, Maudie Flakes, MSN, RN, [place], Arther Abbott  Pager# [phone]

## 2018-04-26 NOTE — Care Management Important Message (Signed)
Important Message  Patient Details  Name: Erik [name] MRN: [id number] Date of Birth: [birth year]   Medicare Important Message Given:  Yes    Tanyika Barros P Alvarado 04/26/2018, 3:24 PM

## 2018-04-26 NOTE — Progress Notes (Addendum)
      [address]       [place] [address]             [phone]      2 Days Post-Op Procedure(s) (LRB): WOUND VAC CHANGE (N/A) VIDEO ASSISTED THORACOSCOPY (Right)   Subjective:  Patient complaining about PCA and having to wear CO2 sensor.  Wants to get rid of pump and increase his Methadone dose.  Objective: Vital signs in last 24 hours: Temp:  [98.5 F (36.9 C)-98.8 F (37.1 C)] 98.8 F (37.1 C) (05/22 0444) Pulse Rate:  [79-100] 99 (05/22 0444) Cardiac Rhythm: Normal sinus rhythm (05/22 0700) Resp:  [15-23] 15 (05/22 0413) BP: (108-133)/(72-89) 133/87 (05/22 0444) SpO2:  [95 %-100 %] 97 % (05/22 0444) Weight:  [219 lb 5.7 oz (99.5 kg)-219 lb 8 oz (99.6 kg)] 219 lb 8 oz (99.6 kg) (05/22 0444)  Intake/Output from previous day: 05/21 0701 - 05/22 0700 In: 1100 [P.O.:800; IV Piggyback:300] Out: 0352 [Urine:3425; Drains:200; Chest Tube:302]  General appearance: alert, cooperative and no distress Heart: regular rate and rhythm Lungs: coarse throughout Abdomen: soft, non-tender; bowel sounds normal; no masses,  no organomegaly Extremities: extremities normal, atraumatic, no cyanosis or edema Wound: wound vac in place  Lab Results: Recent Labs    04/26/18 0503  WBC 7.2  HGB 10.0*  HCT 33.4*  PLT 240   BMET:  Recent Labs    04/26/18 0503  NA 146*  K 3.9  CL 107  CO2 31  GLUCOSE 89  BUN 12  CREATININE 1.34*  CALCIUM 8.3*    PT/INR: No results for input(s): LABPROT, INR in the last 72 hours. ABG    Component Value Date/Time   PHART 7.460 (H) 03/29/2018 0308   HCO3 34.1 (H) 03/29/2018 0308   TCO2 36 (H) 03/29/2018 0308   ACIDBASEDEF 4.0 (H) 07/19/2016 1407   O2SAT 97.0 03/29/2018 0308   CBG (last 3)  No results for input(s): GLUCAP in the last 72 hours.  Assessment/Plan: S/P Procedure(s) (LRB): WOUND VAC CHANGE (N/A) VIDEO ASSISTED THORACOSCOPY (Right)  1. Chest tube- 250 cc output yesterday,  Transitioned to water seal  yesterday 2. ID- VRE empyema- on Linezolid per sensitivities, remains afebrile 3. Wound vac- will plan to change at bedside today per wound care consult 4. CV- hemodynamically stable, mild HTN will monitor 5. Renal- creatinine improved at 1.34 6. Pain control- will d/c PCA per patient request, start IV Fentanyl 25-50 mcg Q2 prn, will also increase Methadone to 30 mg 5x per day and titrate as needed 7. Dispo- patient stable, continue ABX, wound care change today, adjusted pain medications, care management has been consulted for discharge needs   LOS: 8 days    Ellwood Handler 04/26/2018 Patient seen and examined, agree with above Dc PCA Wound care team will change dressing at bedside today Place CT to mini express Begin dc planning- will need to go home with IV antibiotics, chest tube and VAC  Stassi Fadely C. Roxan Hockey, MD [facility] [phone]

## 2018-04-27 ENCOUNTER — Ambulatory Visit (HOSPITAL_COMMUNITY): Payer: Medicare Other

## 2018-04-27 ENCOUNTER — Inpatient Hospital Stay (HOSPITAL_COMMUNITY): Payer: Medicare Other

## 2018-04-27 NOTE — Progress Notes (Signed)
[facility]  [facility] will provide [facility] and Home Infusion services for pt at DC for home IV ABX and support with the [facility] and mini express. [facility] will provide in hospital teaching for home IV ABX to support independence at home.   If patient discharges after hours, please call [phone].   Larry Sierras 04/27/2018, 4:39 PM

## 2018-04-27 NOTE — Care Management Note (Signed)
Case Management Note Marvetta Gibbons RN, BSN Unit 4E-Case Manager [phone]  Patient Details  Name: Erik [name] MRN: [id number] Date of Birth: [birth year]  Subjective/Objective:   Pt admitted with chest wall infection- plan for I&D                 Action/Plan: PTA pt lived at home- has home 02 baseline with [facility]- pt was sent home on last discharge with [facility] for mini express needs- May need [facility] RN again for wound care- CM to follow for transition of care needs  Expected Discharge Date:                  Expected Discharge Plan:  [place]  In-House Referral:     Discharge planning Services  CM Consult  Post Acute Care Choice:  Durable Medical Equipment, Home Health Choice offered to:  Patient  DME Arranged:  Vac DME Agency:  [facility]  HH Arranged:  (P) RN, IV Antibiotics HH Agency:  [facility]  Status of Service:  In process, will continue to follow  If discussed at Long Length of Stay Meetings, dates discussed:    Discharge Disposition: home/home health   Additional Comments:  04/27/18- 1200- Marvetta Gibbons RN, CM- referral for [facility] and home wound VAC needs- spoke with pt at bedside discussed transition of care needs including home IV abx total of 6 weeks pt has portacath which can be used for Home IV abx- port will need to be accessed and flushed for discharge. Pt will also go home with CT to mini express and needs a home wound VAC- have placed [facility] home wound AC form on shadow chart for signature- once signed will fax to [facility] for approval and once approved [facility] will deliver to room prior to discharge. Per pt he would like to use [facility] for all HH needs including home IV abx- CM has reached out to both Pam and Butch Penny with [facility] for [facility] needs- Pt will need orders placed for [facility] including wound VAC drsg change needs- and also OPAT orders for home IV abx  CM will f/u for transition of care needs   Dawayne Patricia, RN 04/27/2018, 3:32  PM

## 2018-04-27 NOTE — Progress Notes (Addendum)
      [address]       [place] [address]             [phone]      3 Days Post-Op Procedure(s) (LRB): WOUND VAC CHANGE (N/A) VIDEO ASSISTED THORACOSCOPY (Right)   Subjective:  Patient without complaints.  He is confused this morning.  Objective: Vital signs in last 24 hours: Temp:  [98.4 F (36.9 C)-98.6 F (37 C)] 98.6 F (37 C) (05/23 0432) Pulse Rate:  [90-107] 90 (05/23 0432) Cardiac Rhythm: Normal sinus rhythm (05/22 1930) Resp:  [12-22] 22 (05/22 1901) BP: (113-126)/(77-89) 126/89 (05/22 1901) SpO2:  [94 %-96 %] 94 % (05/23 0432) Weight:  [218 lb 11.1 oz (99.2 kg)] 218 lb 11.1 oz (99.2 kg) (05/23 0432)  Intake/Output from previous day: 05/22 0701 - 05/23 0700 In: 360 [P.O.:360] Out: 1365 [Urine:1125; Drains:100; Chest Tube:140]  General appearance: alert, cooperative and no distress Heart: regular rate and rhythm Lungs: coarse bilaterally, diminished on right Abdomen: soft, non-tender; bowel sounds normal; no masses,  no organomegaly Extremities: extremities normal, atraumatic, no cyanosis or edema Wound: wound vac in place  Lab Results: Recent Labs    04/26/18 0503  WBC 7.2  HGB 10.0*  HCT 33.4*  PLT 240   BMET:  Recent Labs    04/26/18 0503  NA 146*  K 3.9  CL 107  CO2 31  GLUCOSE 89  BUN 12  CREATININE 1.34*  CALCIUM 8.3*    PT/INR: No results for input(s): LABPROT, INR in the last 72 hours. ABG    Component Value Date/Time   PHART 7.460 (H) 03/29/2018 0308   HCO3 34.1 (H) 03/29/2018 0308   TCO2 36 (H) 03/29/2018 0308   ACIDBASEDEF 4.0 (H) 07/19/2016 1407   O2SAT 97.0 03/29/2018 0308   CBG (last 3)  No results for input(s): GLUCAP in the last 72 hours.  Assessment/Plan: S/P Procedure(s) (LRB): WOUND VAC CHANGE (N/A) VIDEO ASSISTED THORACOSCOPY (Right)  1. Chest tube- transitioned to mini express yesterday, no air leak present, serous output persists 2. ID- VRE Empyema- on Linezolid per sensitivities,  will require several weeks at discharge 3. Wound vac- plan to change again on Friday, patient is tolerating without issue 4. CV- hemodynamically stable 5. Pain- well controlled, tolerating increase dose of Methadone, fentanyl prn 6. Dispo- patient stable, consult placed to care management yesterday for home arrangements, continue current care   LOS: 9 days    Erik [name] 04/27/2018  Patient seen and examined, agree with above Plan 6 weeks of linezolid total  Erik Hitsman C. Roxan Hockey, MD [facility] [phone]

## 2018-04-27 NOTE — Progress Notes (Signed)
Pharmacy Antibiotic Note  Erik [name] is a 57 y.o. male admitted on 04/18/2018 with right empyema with wound infection.  Pharmacy has been following for linezolid dosing after micro data revealed Vancomycin resistant enterococcus.  He is tolerating therapy without noted complications and today he is currently afebrile, WBC wnl and no new complaints.  Susceptibility    Vancomycin resistant enterococcus isolated    MIC    AMPICILLIN >=32 RESIST... Resistant    GENTAMICIN SYNERGY SENSITIVE  Sensitive    LINEZOLID 2 SENSITIVE  Sensitive    VANCOMYCIN >=32 RESIST... Resistant     Plan:  - Continue IV  Linezolid for now but would use oral therapy at discharge since bioavailability is comparable to IV.      -  Continue to monitor clinical response  Height: 6' 0.01" (182.9 cm) Weight: 218 lb 11.1 oz (99.2 kg) IBW/kg (Calculated) : 77.62  Temp (24hrs), Avg:98.5 F (36.9 C), Min:98.4 F (36.9 C), Max:98.6 F (37 C)  Recent Labs  Lab 04/20/18 2131 04/23/18 0343 04/26/18 0503  WBC  --  7.5 7.2  CREATININE  --  1.53* 1.34*  VANCOTROUGH 27*  --   --     Estimated Creatinine Clearance: 75.9 mL/min (A) (by C-G formula based on SCr of 1.34 mg/dL (H)).    Allergies  Allergen Reactions  . Demeclocycline Swelling    SWELLING REACTION UNSPECIFIED   . Tetracyclines & Related Swelling    SWELLING REACTION UNSPECIFIED    Antimicrobials this admission: Vanc 5/14 >> 5/19 Zosyn 5/14 >> 5/19 Linezolid 5/19>>  Dose adjustments this admission: Vanc 1250 Q12 > trough 27 > vanc 750 Q12  Microbiology results: 5/15 R chest tissue > Vancomycin resistant enterococcus isolated   Rober Minion, PharmD., MS Clinical Pharmacist Pager:  [phone] Thank you for allowing pharmacy to be part of this patients care team. 04/27/2018 10:29 AM

## 2018-04-28 DIAGNOSIS — G8929 Other chronic pain: Secondary | ICD-10-CM

## 2018-04-28 DIAGNOSIS — T148XXA Other injury of unspecified body region, initial encounter: Secondary | ICD-10-CM

## 2018-04-28 DIAGNOSIS — M545 Low back pain: Secondary | ICD-10-CM

## 2018-04-28 DIAGNOSIS — J869 Pyothorax without fistula: Secondary | ICD-10-CM

## 2018-04-28 DIAGNOSIS — Z7189 Other specified counseling: Secondary | ICD-10-CM

## 2018-04-28 DIAGNOSIS — L089 Local infection of the skin and subcutaneous tissue, unspecified: Secondary | ICD-10-CM

## 2018-04-28 DIAGNOSIS — Z515 Encounter for palliative care: Secondary | ICD-10-CM

## 2018-04-28 NOTE — Progress Notes (Addendum)
Faxed [facility] Vac order, H&P and last WOC note to Sharman Cheek at [facility] [phone] FAX [phone]  Please call Tommy when ready for [facility] to be delivered.   Spoke w Junie Panning PA about adding Palliative services to [facility] care. She was in agreement and stated she would order palliative consult to also help assess if plan is for patient to resume CA treatment, and if not home hospice may serve him better. CM will continue to follow.

## 2018-04-28 NOTE — Consult Note (Signed)
Consultation Note Date: 04/28/2018   Patient Name: Erik [name]  DOB: 1961/11/30  MRN: [id number]  Age / Sex: 57 y.o., male  PCP: [name], MD Referring Physician: [name], MD  Reason for Consultation: Establishing goals of care, Hospice Evaluation and Psychosocial/spiritual support  HPI/Patient Profile: 57 y.o. male  with past medical history of stage III lung cancer diagnosed in 2017, COPD, DVT, depression, anxiety, hypertension, pneumonia admitted on 04/18/2018 with right chest wall infection, fever.  Patient was admitted to the hospital in April and found to have a large empyema in the right pleural space.  He underwent right-sided VATS with decortication in April.  Patient verbalized that he began to feel "lousy" and presented to his physician with a fever.  Chest x-ray showed increased collection of air at right lung base.  Patient has a chest tube.  He is looking at approximately 6 more weeks of antibiotics.  Consult ordered for goals of care and hospice evaluation.   Clinical Assessment and Goals of Care: Met with patient chart reviewed.  Patient is alert and oriented x3.  He is verbalizing feeling better.  As noted above, he has a wound VAC in place to right chest wall and has been informed that he will be pursuing IV antibiotics in the home.  He remains hopeful that this infection will resolve and that if there is from work chemotherapy indicated to prolong his life he would pursue this.  Patient has a long history of pain.  He is undergone 6 back surgeries.  He is followed by a physician in [place] for the management of his methadone.  He is verbalizing with a dosage increase during this hospitalization that his pain is better managed  Patient is able to speak for himself at this point.  If he were unable to do so his sister, [name], would be his healthcare proxy  Engaged in  brief life review.  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  He lives with his mother and has been her caregiver.  He is very close to his sister who lives next door and will be his primary caregiver as when he returns home with IV antibiotics    SUMMARY OF RECOMMENDATIONS   Continue full code, full scope of treatment Patient's goal is to return home with home health for continuation of approximately 6 weeks of IV antibiotics After this is completed his goal would be for further chemo if this would help prolong his life I did introduce the topic of hospice as a source of support in the future as well as palliative medicine to visit patient in the home. Hospice would be unable to provide IV antibiotics nor do they accept patients who are still actively pursuing chemotherapy Code Status/Advance Care Planning:  Full code    Symptom Management:   Pain: Continue with current pain regimen of methadone 30 mg 5 times a day.  I did speak to patient candidly about the  risks and benefits of methadone particularly not to take more than the prescribed dosing; to rely on breakthrough medicine because of methadone's long half-life.  He verbalized understanding and states he has been on this medicine for approximately 10 years.  Dyspnea: Normally opioids help dyspnea however I have not found methadone to be as effective for relief of dyspnea as other opioids such as morphine or Dilaudid or oxycodone.  Continue with fentanyl IV 25 to 50 mcg every 2 hours as needed for breakthrough pain and/or dyspnea while in the hospital  Palliative Prophylaxis:   Aspiration, Bowel Regimen, Delirium Protocol, Eye Care, Frequent Pain Assessment and Turn Reposition  Additional Recommendations (Limitations, Scope, Preferences):  Full Scope Treatment  Psycho-social/Spiritual:   Desire for further Chaplaincy support:no  Additional Recommendations: Referral to  Community Resources   Prognosis:   Unable to determine  Discharge Planning: Home with Home Health      Primary Diagnoses: Present on Admission: . Wound infection   I have reviewed the medical record, interviewed the patient and family, and examined the patient. The following aspects are pertinent.  Past Medical History:  Diagnosis Date  . 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 Arthritis   . Asthma   . Chronic back pain   . COPD (chronic obstructive pulmonary disease) ([place])   . Depression   . Dyspnea    with exertion   . GERD (gastroesophageal reflux disease)   . Hypertension   . Pneumonia   . Pulmonary embolus ([place])   . Seasonal allergies   . Spinal stenosis of lumbar region   . 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 Wheezing    Social History   Socioeconomic History  . Marital status: Divorced    Spouse name: Not on file  . Number of children: Not on file  . Years of education: Not on file  . Highest education level: Not on file  Occupational History  . Not on file  Social Needs  . Financial resource strain: Not on file  . Food insecurity:    Worry: Not on file    Inability: Not on file  . Transportation needs:    Medical: Not on file    Non-medical: Not on file  Tobacco Use  . Smoking status: Former Smoker    Packs/day: 0.25    Years: 38.00    Pack years: 9.50    Types: Cigarettes    Last attempt to quit: 02/16/2018    Years since quitting: 0.1  . Smokeless tobacco: Former Systems developer    Types: Chew  . Tobacco comment: [place] STAY  Substance and Sexual Activity  . Alcohol use: Yes    Alcohol/week: 1.2 oz    Types: 2 Cans of beer per week    Comment: reports drinking 0-2 beers a week  . Drug use: Yes    Frequency: 7.0 times per week    Types: Marijuana    Comment: daily for cancer; pt denies any use at present time 08/23/17  . Sexual activity: Not Currently  Lifestyle  . Physical activity:    Days per week: Not on file      Minutes per session: Not on file  . Stress: Not on file  Relationships  . Social connections:    Talks on phone: Not on file    Gets together: Not on file    Attends religious service: Not on file    Active member of club or organization: Not on  file    Attends meetings of clubs or organizations: Not on file    Relationship status: Not on file  Other Topics Concern  . Not on file  Social History Narrative  . Not on file   Family History  Problem Relation Age of Onset  . Cancer Mother        breast cancer  . Heart failure Brother   . Colon cancer Neg Hx        not sure, ?grandfather and/or uncle   Scheduled Meds: . albuterol  2.5 mg Nebulization Once  . enoxaparin (LOVENOX) injection  1 mg/kg Subcutaneous Q12H  . gabapentin  600 mg Oral BID  . mouth rinse  15 mL Mouth Rinse BID  . methadone  30 mg Oral 5 X Daily  . mirtazapine  15 mg Oral QHS  . pantoprazole  40 mg Oral BID  . polyvinyl alcohol  1 drop Both Eyes Daily  . saccharomyces boulardii  250 mg Oral BID  . sodium chloride flush  3 mL Intravenous Q12H  . varenicline  0.5 mg Oral Daily   Continuous Infusions: . sodium chloride    . dextrose 5 % and 0.45 % NaCl with KCl 20 mEq/L Stopped (04/28/18 1516)  . linezolid (ZYVOX) IV Stopped (04/28/18 1124)   PRN Meds:.sodium chloride, acetaminophen **OR** acetaminophen, ALPRAZolam, fentaNYL (SUBLIMAZE) injection, levalbuterol, ondansetron **OR** ondansetron (ZOFRAN) IV, sodium chloride flush, sodium chloride flush, sodium chloride flush, traMADol Medications Prior to Admission:  Prior to Admission medications   Medication Sig Start Date End Date Taking? Authorizing Provider  albuterol (PROVENTIL) (2.5 MG/3ML) 0.083% nebulizer solution Take 3 mLs (2.5 mg total) by nebulization every 6 (six) hours as needed for wheezing or shortness of breath (if unable to experience relief by using combivent inhaler). 03/08/18  Yes Barton Dubois, MD  ALPRAZolam Duanne Moron) 1 MG tablet Take 1  mg by mouth 2 (two) times daily as needed for anxiety or sleep (takes 1 tablet and bedtime for sleep and 1 dose during the day only if needed).  06/26/16  Yes [provider]  apixaban (ELIQUIS) 5 MG TABS tablet Take 1 tablet (5 mg total) by mouth 2 (two) times daily. 01/23/18  Yes Jacquelin Hawking, NP  Artificial Tear Solution (SOOTHE XP) SOLN Apply 2 drops to eye daily.   Yes [provider]  Atezolizumab (TECENTRIQ IV) Inject into the vein. Every 3 weeks   Yes [provider]  escitalopram (LEXAPRO) 20 MG tablet Take 20 mg by mouth daily.  07/07/16  Yes [provider]  gabapentin (NEURONTIN) 600 MG tablet Take 600 mg by mouth 2 (two) times daily.  08/19/17  Yes [provider]  guaiFENesin (MUCINEX) 600 MG 12 hr tablet Take 600 mg by mouth 2 (two) times daily as needed (for congestion.).   Yes [provider]  Ipratropium-Albuterol (COMBIVENT) 20-100 MCG/ACT AERS respimat Inhale 1 puff into the lungs every 6 (six) hours as needed for wheezing or shortness of breath. 03/08/18  Yes Barton Dubois, MD  lactulose (CHRONULAC) 10 GM/15ML solution Take 15 mLs (10 g total) by mouth 3 (three) times daily as needed for mild constipation or moderate constipation. 01/23/18  Yes Jacquelin Hawking, NP  loperamide (IMODIUM) 2 MG capsule Take 1 capsule (2 mg total) by mouth as needed for diarrhea or loose stools. 04/08/18  Yes Patrecia Pour, MD  methadone (DOLOPHINE) 10 MG tablet Take 20 mg by mouth 5 (five) times daily. Takes 2 tablets 5 times daily  06/26/16  Yes [provider]  mirtazapine (REMERON) 15 MG tablet Take 1 tablet by mouth at bedtime. 08/17/17  Yes [provider]  Multiple Vitamin (MULTIVITAMIN WITH MINERALS) TABS tablet Take 1 tablet by mouth daily. Centrum   Yes [provider]  ondansetron (ZOFRAN) 8 MG tablet TAKE 1 TABLET BY MOUTH EVERY 8 HOURS AS NEEDED FOR NAUSEA AND VOMITING. 03/02/18  Yes Holley Bouche, NP  OXYGEN  Inhale 3 L into the lungs.   Yes [provider]  pantoprazole (PROTONIX) 40 MG tablet TAKE (1) TABLET BY MOUTH TWICE DAILY. 02/03/18  Yes Holley Bouche, NP  potassium chloride SA (K-DUR,KLOR-CON) 20 MEQ tablet Take 1 tablet (20 mEq total) by mouth daily. 03/14/18  Yes Derek Jack, MD  saccharomyces boulardii (FLORASTOR) 250 MG capsule Take 1 capsule (250 mg total) by mouth 2 (two) times daily for 24 days. 04/08/18 05/02/18 Yes Patrecia Pour, MD  tiZANidine (ZANAFLEX) 4 MG tablet Take 4 mg by mouth every 8 (eight) hours as needed for muscle spasms.  06/03/16  Yes [provider]  varenicline (CHANTIX) 0.5 MG tablet TAKE (2) TABLETS BY MOUTH TWICE DAILY. 04/11/18  Yes Holley Bouche, NP   Allergies  Allergen Reactions  . Demeclocycline Swelling    SWELLING REACTION UNSPECIFIED   . Tetracyclines & Related Swelling    SWELLING REACTION UNSPECIFIED    Review of Systems  Constitutional: Positive for activity change, chills, fatigue and fever.  HENT: Negative.   Eyes: Negative.   Respiratory: Positive for chest tightness and shortness of breath.   Cardiovascular: Positive for chest pain.  Gastrointestinal: Positive for constipation.  Endocrine: Negative.   Genitourinary: Negative.   Musculoskeletal: Positive for back pain.  Skin: Negative.   Allergic/Immunologic: Negative.   Neurological: Positive for weakness.  Hematological: Negative.   Psychiatric/Behavioral: Negative.     Physical Exam  Constitutional: He is oriented to person, place, and time.  Ill-appearing older man in no acute distress  HENT:  Head: Normocephalic and atraumatic.  Neck: Normal range of motion.  Cardiovascular: Normal rate.  Pulmonary/Chest: Effort normal.  Musculoskeletal: Normal range of motion.  Neurological: He is alert and oriented to person, place, and time.  Skin: Skin is warm and dry.  Right chest wall wound with wound VAC applied  Psychiatric: He has a normal mood and  affect. His behavior is normal. Judgment and thought content normal.  Nursing note and vitals reviewed.   Vital Signs: BP (!) 133/91 (BP Location: Right Arm)   Pulse (!) 106   Temp 98.4 F (36.9 C)   Resp (!) 22   Ht 6' 0.01" (1.829 m)   Wt 98.1 kg (216 lb 4.8 oz)   SpO2 94%   BMI 29.33 kg/m  Pain Scale: 0-10 POSS *See Group Information*: S-Acceptable,Sleep, easy to arouse Pain Score: Asleep   SpO2: SpO2: 94 % O2 Device:SpO2: 94 % O2 Flow Rate: .O2 Flow Rate (L/min): 3 L/min  IO: Intake/output summary:   Intake/Output Summary (Last 24 hours) at 04/28/2018 1754 Last data filed at 04/28/2018 4098 Gross per 24 hour  Intake 3 ml  Output 3075 ml  Net -3072 ml    LBM: Last BM Date: 04/27/18 Baseline Weight: Weight: 94.6 kg (208 lb 8.9 oz) Most recent weight: Weight: 98.1 kg (216 lb 4.8 oz)     Palliative Assessment/Data:   Flowsheet Rows     Most Recent Value  Intake Tab  Referral Department  Hospitalist  Unit at Time of  Referral  Intermediate Care Unit  Palliative Care Primary Diagnosis  Cancer  Date Notified  04/27/18  Palliative Care Type  New Palliative care  Reason for referral  Clarify Goals of Care, Counsel Regarding Hospice, Psychosocial or Spiritual support  Date of Admission  04/18/18  Date first seen by Palliative Care  04/28/18  # of days Palliative referral response time  1 Day(s)  # of days IP prior to Palliative referral  9  Clinical Assessment  Palliative Performance Scale Score  50%  Pain Max last 24 hours  6  Pain Min Last 24 hours  2  Dyspnea Max Last 24 Hours  7  Dyspnea Min Last 24 hours  2  Nausea Max Last 24 Hours  0  Nausea Min Last 24 Hours  0  Anxiety Max Last 24 Hours  0  Anxiety Min Last 24 Hours  0  Other Max Last 24 Hours  0  Psychosocial & Spiritual Assessment  Palliative Care Outcomes  Patient/Family meeting held?  Yes  Who was at the meeting?  pt      Time In: 1650 Time Out: 1805 Time Total: 75 min Greater than 50%  of  this time was spent counseling and coordinating care related to the above assessment and plan.  Signed by: Dory Horn, NP   Please contact Palliative Medicine Team phone at [phone] for questions and concerns.  For individual provider: See Shea Evans

## 2018-04-28 NOTE — Progress Notes (Addendum)
      [address]       [place] [address]             [phone]      4 Days Post-Op Procedure(s) (LRB): WOUND VAC CHANGE (N/A) VIDEO ASSISTED THORACOSCOPY (Right)   Subjective:  No new complaints.  Still has some confusion.  Pain well controlled with increase dose of Methadone.  Objective: Vital signs in last 24 hours: Temp:  [98.6 F (37 C)-98.9 F (37.2 C)] 98.6 F (37 C) (05/24 0635) Pulse Rate:  [91-106] 106 (05/23 2055) Cardiac Rhythm: Normal sinus rhythm;Sinus tachycardia (05/23 2200) Resp:  [12-19] 16 (05/24 0635) BP: (119-126)/(76-96) 126/96 (05/23 2055) SpO2:  [96 %-100 %] 96 % (05/24 0635) Weight:  [216 lb 4.8 oz (98.1 kg)] 216 lb 4.8 oz (98.1 kg) (05/24 0635)  Intake/Output from previous day: 05/23 0701 - 05/24 0700 In: 3 [I.V.:3] Out: 3076 [Urine:2825; Drains:150; Stool:1; Chest Tube:100]  General appearance: alert, cooperative and no distress Heart: regular rate and rhythm Lungs: diminished breath sounds bibasilar Abdomen: soft, non-tender; bowel sounds normal; no masses,  no organomegaly Extremities: extremities normal, atraumatic, no cyanosis or edema Wound: wound vac in place  Lab Results: Recent Labs    04/26/18 0503  WBC 7.2  HGB 10.0*  HCT 33.4*  PLT 240   BMET:  Recent Labs    04/26/18 0503  NA 146*  K 3.9  CL 107  CO2 31  GLUCOSE 89  BUN 12  CREATININE 1.34*  CALCIUM 8.3*    PT/INR: No results for input(s): LABPROT, INR in the last 72 hours. ABG    Component Value Date/Time   PHART 7.460 (H) 03/29/2018 0308   HCO3 34.1 (H) 03/29/2018 0308   TCO2 36 (H) 03/29/2018 0308   ACIDBASEDEF 4.0 (H) 07/19/2016 1407   O2SAT 97.0 03/29/2018 0308   CBG (last 3)  No results for input(s): GLUCAP in the last 72 hours.  Assessment/Plan: S/P Procedure(s) (LRB): WOUND VAC CHANGE (N/A) VIDEO ASSISTED THORACOSCOPY (Right)   1. Chest tube- on Mini Express, no air leak, serous output remains present 2. ID- VRE,  Empyema- continue IV Linezolid, will need 6 weeks total therapy 3. Wound vac change today 4. CV- hemodynamically stable 5. Pain well controlled with increased dose of Methadone 6. Continue Lovenox for H/O DVT 7. Dispo- patient stable, for wound vac change today, continue IV ABX, chest tube output remains present, continue current care   LOS: 10 days    Ellwood Handler 04/28/2018  Agree with above Wound vac changed today. It will be changed again Monday. Continue IV antibiotic, chest tube.

## 2018-04-28 NOTE — Consult Note (Signed)
[facility] Nurse wound follow up Wound type:Scheduled NPWT dressing change to right lateral chest Measurement:Per Wednesday (see note) Wound bed:red, moist Drainage (amount, consistency, odor) serous drainage in cannister Periwound:Clear, area of medical adhesive related skin injury (MARSI) to 2 o'clock measuring 0.4cm x 3cm and presents as a fluid filled blister. Dressing procedure/placement/frequency: NPWT dressing removed and 5 pieces of black foam removed from wound bed and three defects.  Wound cleansed with NS.  Assisted by bedside RN today. Black foam strips inserted into the two areas in the superior wound and the wound near the chest tube.  One piece of foam used to cover superior wound. Two wounds connected via a foam bridge for a total of five (5) pieces of black foam used in dressing today. Drape used to cover dressings and wound. TRAC pad applied and an immediate seal achieved at 15mmHg continuous negative pressure. Patient was premedicated with fentanyl and tolerated procedure well.  Next dressing change is due on Monday, 5/27.  [facility] nursing team will follow, and will remain available to this patient, the nursing and medical teams.   Thanks, Maudie Flakes, MSN, RN, [place], Arther Abbott  Pager# [phone]

## 2018-04-29 ENCOUNTER — Inpatient Hospital Stay (HOSPITAL_COMMUNITY): Payer: Medicare Other

## 2018-04-29 NOTE — Progress Notes (Addendum)
      [address]       [place] [address]             [phone]      5 Days Post-Op Procedure(s) (LRB): WOUND VAC CHANGE (N/A) VIDEO ASSISTED THORACOSCOPY (Right)   Subjective:  More Alert this morning, pain controlled  Objective: Vital signs in last 24 hours: Temp:  [98.3 F (36.8 C)-98.6 F (37 C)] 98.3 F (36.8 C) (05/25 0508) Pulse Rate:  [98-100] 98 (05/25 0508) Cardiac Rhythm: Normal sinus rhythm;Sinus tachycardia (05/24 2016) Resp:  [15-22] 18 (05/25 0508) BP: (116-133)/(78-91) 116/78 (05/25 0508) SpO2:  [94 %-99 %] 96 % (05/25 0508) Weight:  [225 lb 12 oz (102.4 kg)] 225 lb 12 oz (102.4 kg) (05/25 0508)  Intake/Output from previous day: 05/24 0701 - 05/25 0700 In: 3 [I.V.:3] Out: 2805 [Urine:2600; Drains:90; Chest Tube:115]  General appearance: cooperative and no distress Heart: regular rate and rhythm Lungs: diminished breath sounds bibasilar Abdomen: soft, non-tender; bowel sounds normal; no masses,  no organomegaly Extremities: extremities normal, atraumatic, no cyanosis or edema Wound: wound vac in place  Lab Results: No results for input(s): WBC, HGB, HCT, PLT in the last 72 hours. BMET: No results for input(s): NA, K, CL, CO2, GLUCOSE, BUN, CREATININE, CALCIUM in the last 72 hours.  PT/INR: No results for input(s): LABPROT, INR in the last 72 hours. ABG    Component Value Date/Time   PHART 7.460 (H) 03/29/2018 0308   HCO3 34.1 (H) 03/29/2018 0308   TCO2 36 (H) 03/29/2018 0308   ACIDBASEDEF 4.0 (H) 07/19/2016 1407   O2SAT 97.0 03/29/2018 0308   CBG (last 3)  No results for input(s): GLUCAP in the last 72 hours.  Assessment/Plan: S/P Procedure(s) (LRB): WOUND VAC CHANGE (N/A) VIDEO ASSISTED THORACOSCOPY (Right)  1. 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 ID is asking if patient can go home on oral Linezolid, will  discuss with Dr. Roxan Hockey next week 3. Wound vac care- due to be changed on Monday 4. Pain control- continue increased dose of Methadone 5. Continue Lovenox for H/O DVT 6. Dispo- patient stable, H/H arrangements have been made, will clarify ABX with Dr. Roxan Hockey next week when he gets back.. Will plan for wound vac change on Monday, possibly for d/c Tuesday if he remains stable   LOS: 11 days    Ellwood Handler 04/29/2018   Chart reviewed, patient examined, agree with above. Continue chest tube, antibiotic. Vac change Monday.

## 2018-04-30 NOTE — Progress Notes (Addendum)
      [address]       [place] [address]             [phone]      6 Days Post-Op Procedure(s) (LRB): WOUND VAC CHANGE (N/A) VIDEO ASSISTED THORACOSCOPY (Right)   Subjective:  No new complaints.  More alert this morning.  Objective: Vital signs in last 24 hours: Temp:  [97.6 F (36.4 C)-99.1 F (37.3 C)] 97.6 F (36.4 C) (05/26 0842) Pulse Rate:  [92-97] 92 (05/26 0601) Cardiac Rhythm: Normal sinus rhythm (05/25 2000) Resp:  [16-20] 20 (05/26 0601) BP: (119-159)/(82-91) 135/82 (05/26 0842) SpO2:  [96 %-98 %] 96 % (05/26 0601) Weight:  [224 lb 9.6 oz (101.9 kg)] 224 lb 9.6 oz (101.9 kg) (05/26 0601)  Intake/Output from previous day: 05/25 0701 - 05/26 0700 In: 540 [P.O.:480] Out: 3125 [Urine:2950; Drains:50; Chest Tube:125]  General appearance: alert, cooperative and no distress Heart: regular rate and rhythm Lungs: diminished breath sounds bilaterally and R more than left Abdomen: soft, non-tender; bowel sounds normal; no masses,  no organomegaly Extremities: extremities normal, atraumatic, no cyanosis or edema Wound: clean and dry  Lab Results: No results for input(s): WBC, HGB, HCT, PLT in the last 72 hours. BMET: No results for input(s): NA, K, CL, CO2, GLUCOSE, BUN, CREATININE, CALCIUM in the last 72 hours.  PT/INR: No results for input(s): LABPROT, INR in the last 72 hours. ABG    Component Value Date/Time   PHART 7.460 (H) 03/29/2018 0308   HCO3 34.1 (H) 03/29/2018 0308   TCO2 36 (H) 03/29/2018 0308   ACIDBASEDEF 4.0 (H) 07/19/2016 1407   O2SAT 97.0 03/29/2018 0308   CBG (last 3)  No results for input(s): GLUCAP in the last 72 hours.  Assessment/Plan: S/P Procedure(s) (LRB): WOUND VAC CHANGE (N/A) VIDEO ASSISTED THORACOSCOPY (Right)  1. Chest tube- new Mini express hooked up last night, 150cc output this morning, just in chest tube- leave in place 2. ID- VRE empyema- continue Linezolid will require 6 weeks of therapy 3.  Wound vac- plan to change tomorrow 4. CV- hemodynamically stable, + HTN 5. Continue Lovenox for H/O DVT 6. Dispo- patient stable, for wound vac change tomorrow, continue ABX, will discuss need for antihypertensive with Dr. Cyndia Bent, continue current care   LOS: 12 days    Erik [name] 04/30/2018    Chart reviewed, patient examined, agree with above. Anxious to go home. VAC change tomorrow and possibly home Tuesday with HH.

## 2018-04-30 NOTE — Plan of Care (Signed)
  Problem: Education: Goal: Knowledge of General Education information will improve Outcome: Progressing   Problem: Clinical Measurements: Goal: Will remain free from infection Outcome: Progressing Goal: Cardiovascular complication will be avoided Outcome: Progressing

## 2018-04-30 NOTE — Progress Notes (Signed)
Patient had a total of 50 ml of serosanguineous drainage in Right wound vac cannister for this shift. Cannister marked. Patient had a total of 80 mL of serosanguineous drainage in Mini express chest tube drainage system. Mini express drainage system marked. Chest tube dressings saturated x 2 this shift. Area cleaned and new dressing in place x 2.

## 2018-05-01 MED ORDER — LINEZOLID 600 MG PO TABS: 600.0000 mg | ORAL_TABLET | Freq: Two times a day (BID) | ORAL | 0 refills | Status: AC

## 2018-05-01 MED ORDER — LEVALBUTEROL HCL 0.63 MG/3ML IN NEBU: 0.6300 mg | INHALATION_SOLUTION | Freq: Three times a day (TID) | RESPIRATORY_TRACT | 12 refills | Status: AC | PRN

## 2018-05-01 MED ORDER — HEPARIN SOD (PORK) LOCK FLUSH 100 UNIT/ML IV SOLN
500.0000 [IU] | INTRAVENOUS | Status: AC | PRN
  Administered 2018-05-01: 500 [IU]

## 2018-05-01 MED ORDER — ACETAMINOPHEN 325 MG PO TABS: 650.0000 mg | ORAL_TABLET | Freq: Four times a day (QID) | ORAL | Status: AC | PRN

## 2018-05-01 NOTE — Consult Note (Addendum)
[facility] Nurse wound follow up Wound type:Scheduled NPWT dressing change to right lateral chest Measurement:Per Wednesday, refer to previous progress notes Wound bed:red, moist Drainage (amount, consistency, odor) mod amt serous drainage in cannister Periwound:Clear, area of medical adhesive related skin injury (MARSI) to 2 o'clock measuring 0.4cm x 3cm which is a pink moist partial thickness wound Dressing procedure/placement/frequency: NPWT dressing removed and 5 pieces of black foam removed from wound bed and three defects.  Wound cleansed with NS.  Assisted by bedside RN today. Black foam strips inserted into the two areas in the superior wound and the wound near the chest tube.  Two wounds connected via a foam bridge for a total of 4 pieces of black foam used in dressing today. Chest tube located next to the wounds is leaking mod amt yellow drainage; applied barrier ring to wound edges to attempt to maintain a seal. Drape used to cover dressings and wound. TRAC pad applied and an immediate seal achieved at 60mmHg continuous negative pressure. Patient was premedicated and tolerated with minimal amt pain.WOC will plan to change dressing on Wed if patient is still in the hospital at that time. Julien Girt MSN, RN, [place], [place], [place]

## 2018-05-01 NOTE — Care Management Note (Signed)
Case Management Note  Patient Details  Name: ELY BALLEN MRN: [id number] Date of Birth: Apr 14, 1961  Subjective/Objective:                    Action/Plan:  Home health orders placed . Home health already arranged through [facility] aware discharge is today.   [facility] delivered home [facility] Apr 28, 2018 .     Expected Discharge Date:  05/01/18               Expected Discharge Plan:  [facility]  In-House Referral:     Discharge planning Services  CM Consult  Post Acute Care Choice:  Durable Medical Equipment, Home Health Choice offered to:  Patient  DME Arranged:  Vac DME Agency:  [facility]  HH Arranged:  RN [place] Agency:  [facility]  Status of Service:  Completed, signed off  If discussed at [place] of Stay Meetings, dates discussed:    Additional Comments:  Marilu Favre, RN 05/01/2018, 11:09 AM

## 2018-05-01 NOTE — Progress Notes (Addendum)
      [address]       [place] [address]             [phone]      7 Days Post-Op Procedure(s) (LRB): WOUND VAC CHANGE (N/A) VIDEO ASSISTED THORACOSCOPY (Right)   Subjective:   No specific complaints.  He really wants to go home today.  He states Dr. Roxan Hockey told him he would only be here 1-2 days  Objective: Vital signs in last 24 hours: Temp:  [97.6 F (36.4 C)-98.6 F (37 C)] 98.6 F (37 C) (05/27 0551) Pulse Rate:  [98-106] 106 (05/27 0551) Cardiac Rhythm: Sinus tachycardia (05/27 0700) Resp:  [11-18] 18 (05/27 0551) BP: (124-137)/(80-91) 136/88 (05/27 0551) SpO2:  [90 %-99 %] 93 % (05/27 0551) Weight:  [213 lb (96.6 kg)] 213 lb (96.6 kg) (05/27 0551)  Intake/Output from previous day: 05/26 0701 - 05/27 0700 In: 2370 [P.O.:1710; I.V.:60; IV Piggyback:600] Out: 2950 [Urine:2900; Chest Tube:50]  General appearance: alert, cooperative and no distress Heart: regular rate and rhythm Lungs: diminished breath sounds bibasilar Abdomen: soft, non-tender; bowel sounds normal; no masses,  no organomegaly Extremities: extremities normal, atraumatic, no cyanosis or edema Wound: wound vac in place  Lab Results: No results for input(s): WBC, HGB, HCT, PLT in the last 72 hours. BMET: No results for input(s): NA, K, CL, CO2, GLUCOSE, BUN, CREATININE, CALCIUM in the last 72 hours.  PT/INR: No results for input(s): LABPROT, INR in the last 72 hours. ABG    Component Value Date/Time   PHART 7.460 (H) 03/29/2018 0308   HCO3 34.1 (H) 03/29/2018 0308   TCO2 36 (H) 03/29/2018 0308   ACIDBASEDEF 4.0 (H) 07/19/2016 1407   O2SAT 97.0 03/29/2018 0308   CBG (last 3)  No results for input(s): GLUCAP in the last 72 hours.  Assessment/Plan: S/P Procedure(s) (LRB): WOUND VAC CHANGE (N/A) VIDEO ASSISTED THORACOSCOPY (Right)  1. Chest tube - continue to Mini Express, output remains quite high 2. ID- continue ABX for VRE Empyema- will transition to oral  Linzeolid at discharge 3. Wound vac- to be changed today via wound care 4. CV- remains stable 5. Restart home Xarelto at discharge for H/O DVT 6. Dispo- patient stable, continue current care will discuss possible d/c today with Dr. Cyndia Bent   LOS: 13 days    Ellwood Handler 05/01/2018   Chart reviewed, patient examined, agree with above. He can go home today after VAC change if [facility] can be arranged.

## 2018-05-01 NOTE — Discharge Instructions (Signed)
Change Wound VAC every MWF  Record Chest Tube Output  1. Resume Home Diet 2. Activity as tolerated 3. No Driving as taking narcotics 4. Sponge bath only.. Can not shower or tub bath with chest tube and wound vac in place 5. Call Dr. Celestia Khat office if problems arise [phone]

## 2018-05-02 ENCOUNTER — Telehealth: Payer: Self-pay

## 2018-05-02 DIAGNOSIS — C3491 Malignant neoplasm of unspecified part of right bronchus or lung: Secondary | ICD-10-CM | POA: Diagnosis not present

## 2018-05-02 NOTE — Telephone Encounter (Signed)
Erik [name] called in reference to his antibiotic after recent discharge from the hospital.  He stated that his pharmacy did not have the antibiotic prescribed.  He was asking if we could change the antibiotic, or wait until tomorrow to start his prescription.  I advised the patient to contact another local pharmacy that had the medication and get the prescription transferred to there.  Patient acknowledged receipt.  I educated the patient on the importance of taking the antibiotic prescribed and not missing any doses.

## 2018-05-03 ENCOUNTER — Other Ambulatory Visit: Payer: Self-pay

## 2018-05-03 ENCOUNTER — Inpatient Hospital Stay (HOSPITAL_BASED_OUTPATIENT_CLINIC_OR_DEPARTMENT_OTHER): Payer: Medicare Other | Admitting: Internal Medicine

## 2018-05-03 ENCOUNTER — Encounter (HOSPITAL_COMMUNITY): Payer: Self-pay | Admitting: Internal Medicine

## 2018-05-03 VITALS — BP 138/100 | HR 116 | Temp 98.3°F | Resp 20 | Wt 213.0 lb

## 2018-05-03 DIAGNOSIS — Z79899 Other long term (current) drug therapy: Secondary | ICD-10-CM | POA: Diagnosis not present

## 2018-05-03 DIAGNOSIS — I313 Pericardial effusion (noninflammatory): Secondary | ICD-10-CM

## 2018-05-03 DIAGNOSIS — F17210 Nicotine dependence, cigarettes, uncomplicated: Secondary | ICD-10-CM

## 2018-05-03 DIAGNOSIS — J90 Pleural effusion, not elsewhere classified: Secondary | ICD-10-CM | POA: Diagnosis not present

## 2018-05-03 DIAGNOSIS — J449 Chronic obstructive pulmonary disease, unspecified: Secondary | ICD-10-CM

## 2018-05-03 DIAGNOSIS — J189 Pneumonia, unspecified organism: Secondary | ICD-10-CM

## 2018-05-03 DIAGNOSIS — Z9221 Personal history of antineoplastic chemotherapy: Secondary | ICD-10-CM | POA: Diagnosis not present

## 2018-05-03 DIAGNOSIS — F329 Major depressive disorder, single episode, unspecified: Secondary | ICD-10-CM | POA: Diagnosis not present

## 2018-05-03 DIAGNOSIS — Z86711 Personal history of pulmonary embolism: Secondary | ICD-10-CM | POA: Diagnosis not present

## 2018-05-03 DIAGNOSIS — M48061 Spinal stenosis, lumbar region without neurogenic claudication: Secondary | ICD-10-CM | POA: Diagnosis not present

## 2018-05-03 DIAGNOSIS — J869 Pyothorax without fistula: Secondary | ICD-10-CM | POA: Diagnosis not present

## 2018-05-03 DIAGNOSIS — C3431 Malignant neoplasm of lower lobe, right bronchus or lung: Secondary | ICD-10-CM

## 2018-05-03 DIAGNOSIS — M7989 Other specified soft tissue disorders: Secondary | ICD-10-CM

## 2018-05-03 DIAGNOSIS — Z7901 Long term (current) use of anticoagulants: Secondary | ICD-10-CM

## 2018-05-03 DIAGNOSIS — C3491 Malignant neoplasm of unspecified part of right bronchus or lung: Secondary | ICD-10-CM

## 2018-05-03 DIAGNOSIS — K7460 Unspecified cirrhosis of liver: Secondary | ICD-10-CM | POA: Diagnosis not present

## 2018-05-03 DIAGNOSIS — K219 Gastro-esophageal reflux disease without esophagitis: Secondary | ICD-10-CM | POA: Diagnosis not present

## 2018-05-03 DIAGNOSIS — Z923 Personal history of irradiation: Secondary | ICD-10-CM

## 2018-05-03 DIAGNOSIS — R161 Splenomegaly, not elsewhere classified: Secondary | ICD-10-CM

## 2018-05-03 DIAGNOSIS — I10 Essential (primary) hypertension: Secondary | ICD-10-CM | POA: Diagnosis not present

## 2018-05-03 MED ORDER — MISC. DEVICES MISC: 0 refills | Status: DC

## 2018-05-03 MED ORDER — MISC. DEVICES MISC: 0 refills | Status: AC

## 2018-05-03 NOTE — Progress Notes (Signed)
Diagnosis Adenocarcinoma of right lung ([place]) - Plan: Misc. Devices MISC, CBC with Differential/Platelet, Comprehensive metabolic panel, Lactate dehydrogenase, DISCONTINUED: Misc. Devices MISC  Empyema of lung ([place]) - Plan: Misc. Devices MISC, CBC with Differential/Platelet, Comprehensive metabolic panel, Lactate dehydrogenase, DISCONTINUED: Misc. Devices MISC  Chronic obstructive pulmonary disease, unspecified COPD type ([place]) - Plan: Misc. Devices MISC, DISCONTINUED: Misc. Devices MISC  Staging Cancer Staging Adenocarcinoma of right lung [facility]) Staging form: Lung, AJCC 7th Edition - Clinical stage from 07/23/2016: Stage IIIA (T2b, N2, M0) - Signed by Baird Cancer, PA-C on 07/23/2016 - Clinical stage from 07/28/2017: Stage IV (M1a) - Signed by Holley Bouche, NP on 07/28/2017   Assessment and Plan:  1. Stage IV (T7S1X7L) adenocarcinoma of right lung, initially diagnosed with Stage IIIA (T3N2M0) disease on RML biopsy by Dr. Roxan Hockey on 07/21/2016.  He was previously followed by Dr. Talbert Cage and Dr. Lebron Conners.  He is S/P concomitant chemoXRT with Cisplatin/Etoposide (08/17/2016- 09/20/2016).  He was then transitioned to consolidative immunotherapy with Imfinzi, but then developed progression of disease on imaging in 07/2017.  His treatment was therefore transitioned to Tecentriq beginning on 08/04/2017.  Left supraclavicular biopsy on 08/23/17 demonstrated metastatic adenocarcinoma of lung. Not enough tissue on supraclavicular bx to send for Foundation One, therefore we ordered Liquid Foundation One from peripheral blood which genomid findins of MAP2K1 and TP53 mutation, MSI status undetermined.  Pt had CT Angio of the chest that was done 01/28/2018 showed consolidation but no PE.  CT angio of the chest that was done on 12/26/2017 shows small PE in the left lower lobe with emphysematous changes.  There is also a right pleural effusion.  CT of the abdomen and pelvis done 12/28/2017 shows no evidence of  metastatic disease.  There is evidence of liver cirrhosis.    Pleural biopsy done 03/28/2018 showed no malignancy.  He had recent Vats and drainage of empyema in 03/2018.  He continues to have chest tube and wound vac  in and  will see Dr. Roxan Hockey next week  Will await CT surgery follow-up prior to resuming therapy.  Pt originally planned for  repeat imaging in 05/2018.  Will defer imaging until chest tube removed.    2.  Empyema.  Pt was last treated with Tecentirq in 02/2018 due to problems with SOB.  He has been treated with abx.  He was seen by Dr. Delton Coombes 03/14/2018 and he ordered CT of chest which was done on 03/23/2018 and showed   IMPRESSION: 1. New large empyema noted at the right pleural space, with a complex collection of air and fluid and underlying pleural thickening. Given the amount of air within the collection, a bronchopleural fistula is a concern. 2. Worsening diffuse right-sided necrotizing pneumonia, and new left apical and left basilar pneumonia. 3. Mediastinal lymphadenopathy likely reflects the acute infection. Diffuse inflammation tracks about the esophagus and distal trachea, likely reflecting extension of the severe lung infection. 4. Opacification of the bronchus to the right lower lung lobe. Underlying aspiration cannot be excluded. 5. Small pericardial effusion. 6. Splenomegaly  He was admitted on 03/23/2018 and discharged on 04/07/2018 after VATS and drainage of empyema.  Chest tube was removed on 04/07/2018.  He was seen by Dr. Roxan Hockey on 04/18/2018 at which time he was noted to be febrile at 101.1 and tachycardic.  CXR was obtained and showed increasing collection of air in the right lung base associated with air fluid levels, and increasing subcutaneous emphysema of the right chest wall.  It  was felt the patient would require admission with IV antibiotics.  He underwent surgical exploration for further diagnosis and treatment of empyema on 04/19/2018.  He  underwent Irrigation and debridement of right chest wound with culture of wound, chest tube insertion, and application of wound vac.  He tolerated the procedure without difficulty, was extubated and taken to the PACU in stable condition.  The patient's chest tube was free from air leak on POD #1.  He was taken back to the OR on 04/21/2018 for wound vac change, Video Assisted Thoracoscopy, and chest tube placement.  He again tolerated the procedure without difficulty and was taken to the PACU in stable condition.  He remained clinically stable.  He was treated with broad spectrum antibiotics.  He underwent midline placement for prolonged antibiotics.  He was taken back to the OR on 04/24/2018 for wound vac change and repeat chest tube placement.  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  He is scheduled to see Dr. Roxan Hockey next week.  He is in need of assistance with obtaining home O2.  Case manager evaluation today for home O2.  Will await CT surgery input due to recent procedures.  He will RTC in 4 weeks for repeat labs.  Continue dressing changes as recommended by surgery.    Pt is afebrile.  Labs done 04/26/2018  show normal WBC count of 7.2.     3.  PE.  He is currently on Eliquis.  He had a CT angio the chest on 01/28/2018 that showed no PE.  Will continue therapy and will set up for repeat CTA in 3-6 months for initial evaluation.    4.  Emphysema.  This was noted on recent imaging.  He should continue to follow with Dr. Luan Pulling as recommended.  5.  Splenomegaly.  Pt was noted to have evidence of liver cirrhosis on prior imaging done 12/2017.  Plts are 240,000.  Will repeat labs on RTC in 4 weeks.   Hepatitis panel negative.    6.  Hypokalemia.  Pt reports he is no longer on lasix.  Potassium level on 04/26/2018 is WNL at 3.9.  Continue potassium supplementation as recommended.    Interval History:  57 y.o. presenting to the [facility] for continued clinical monitoring while receiving palliative systemic immunotherapy with Gildardo Pounds which was started in August 2018 for diagnosis of adenocarcinoma of the right lung, stage IV (T3 N2 M1a).    Current Status: Patient is here today for follow-up after hospital discharge.  He has chest tube and wound vac in place.  He is also on Zyvox.  He is scheduled to see Dr. Roxan Hockey next week.      Adenocarcinoma of right lung ([place])   07/01/2016 PET scan    6.3 x 3.7 cm right lower lobe mass, suspicious for primary bronchogenic neoplasm, as described above. Hypermetabolic thoracic nodal metastases, as above. Additional right perihilar hypermetabolism, indeterminate. Associated right middle lobe atelectasis/collapse.      07/19/2016 Procedure    Bronchoscopy with brushings and biopsies and endobronchial ultrasound with mediastinal lymph node aspirations by Dr. Roxan Hockey      07/21/2016 Pathology Results    Lung, biopsy, Right Middle Lobe - LUNG TISSUE WITH SQUAMOUS METAPLASIA. - NO MALIGNANCY IDENTIFIED.      07/21/2016 Pathology Results    FINE NEEDLE ASPIRATION, ENDOSCOPIC (A) LEVEL 7 (SPECIMEN 1 OF 3, COLLECTED ON 07/19/16): MALIGNANT CELLS CONSISTENT WITH ADENOCARCINOMA.  07/21/2016 Pathology Results    FINE NEEDLE ASPIRATION, EBUS, 4R, B (SPECIMEN 2 OF 3, COLLECTED 07/19/16): MALIGNANT CELLS CONSISTENT WITH ADENOCARCINOMA.      07/21/2016 Pathology Results    FINE NEEDLE ASPIRATION, EBUS, BRUSHING, RIGHT MIDDLE LOBE, D (SPECIMEN 3 OF 3, COLLECTED 07/19/16): MALIGNANT CELLS CONSISTENT WITH ADENOCARCINOMA.      07/22/2016 Imaging    MRI brain- No evidence of intracranial metastases.      08/17/2016 - 09/20/2016 Chemotherapy    The patient had palonosetron (ALOXI) injection 0.25 mg, 0.25 mg, Intravenous,  Once, 1 of 1 cycle  CISplatin (PLATINOL) 123 mg in sodium chloride 0.9 % 500 mL chemo infusion, 50 mg/m2 = 123 mg, Intravenous,  Once, 1 of 1 cycle  etoposide  (VEPESID) 120 mg in sodium chloride 0.9 % 500 mL chemo infusion, 50 mg/m2 = 120 mg, Intravenous,  Once, 1 of 1 cycle  fosaprepitant (EMEND) 150 mg, dexamethasone (DECADRON) 12 mg in sodium chloride 0.9 % 145 mL IVPB, , Intravenous,  Once, 1 of 1 cycle  ondansetron (ZOFRAN) 8 mg in sodium chloride 0.9 % 50 mL IVPB, , Intravenous,  Once, 2 of 6 cycles  for chemotherapy treatment.        08/17/2016 -  Radiation Therapy         11/15/2016 Imaging    CT CAP- Interval response to therapy. The previously demonstrated mediastinal and right hilar adenopathy and resulting right middle lobe atelectasis have all improved. 2. No discrete residual lung masses are identified. There is new multifocal ground-glass opacity within the right lower lobe, and to a lesser extent in the left upper lobe. These are probably inflammatory/treatment related. 3. No evidence of abdominopelvic metastatic disease. Stable prominent lymph nodes in the upper abdomen, likely reactive. 4. Decompressed mid SVC without specific signs of SVC occlusion.      12/02/2016 -  Chemotherapy    Imfinzi (durvalumab) immunotherapy every 2 weeks x up to 1 year      01/03/2017 Procedure    EGD by Dr. [name], colonoscopy aborted as "patient forgot to take other half of preparation). Esophagitis. likely radiation-induced ?Dilated.  Erythematous mucosa in the stomach. Biopsied. Normal duodenal bulb and second portion of the duodenum.      02/14/2017 Imaging    CT chest- 1. Development of a small to moderate right-sided pleural effusion with minimal loculation anteriorly. 2. Worsened right-sided aeration with right middle and lower lobe consolidation. As this has a geographic distribution, this could be radiation induced or represent infection. Depending on clinical concern of progressive disease, thoracentesis and/or PET may be informative. 3. Development of mild right paratracheal adenopathy, most likely reactive. Recommend  attention on follow-up. 4. Subtle findings which are highly suspicious for mild cirrhosis. Upper abdominal adenopathy is similar and likely reactive. 5. Persistent right lower and improved left upper lobe ground-glass opacities are favored to be infectious or inflammatory.      06/01/2017 Imaging    CT chest  IMPRESSION: 1. Evolving radiation changes in the medial right hemithorax. 2. Stable moderate size right pleural effusion without definite nodular components. 3. No signs of local recurrence or progressive metastatic disease. No residual enlarged thoracic lymph nodes. 4. Stable appearance of the visualized upper abdomen with probable cirrhosis and reactive adenopathy in the porta hepatis.      07/15/2017 Procedure    Therapeutic thoracentesis performed today; 1.3 L removed.  Fluid sent for cytology.       07/15/2017 Pathology Results    Pleural fluid NEGATIVE  for malignancy by cytology.       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 Paramediastinal radiation change and loculated right pleural effusion appear unchanged from 07/14/2017. 2. Borderline prevascular lymph node within the anterior mediastinum is mildly increased in size from previous exam. 3. There is a new 5 mm lung nodule within the left lower lobe. Nonspecific in appearance. Attention on follow-up imaging advise. Other small nonspecific nodules in the left lung are stable. 4. Morphologic features of the liver compatible with cirrhosis. Enlarged upper abdominal lymph nodes are nonspecific and likely reactive. 5. Stable indeterminate low-attenuation focus in the posterior right liver dome. Previously characterized on MRI from 07/29/2017 as reflecting post treatment changes from external beam radiation to the lung. 6.  Emphysema (ICD10-J43.9).        Problem List Patient Active Problem List   Diagnosis Date  Noted  . Empyema ([place]) [J86.9]   . Palliative care by specialist [Z51.5]   . VRE (vancomycin-resistant Enterococci) infection [A49.1, Z16.21] 04/25/2018  . Wound infection [T14.8XXA, L08.9] 04/18/2018  . Malnutrition of moderate degree [E44.0] 03/30/2018  . Sepsis due to pneumonia ([place]) [J18.9, A41.9] 03/23/2018  . Empyema of lung ([place]) [J86.9] 03/23/2018  . History of pulmonary embolism [Z86.711] 03/23/2018  . Empyema lung ([place]) [J86.9] 03/23/2018  . Bronchiectasis with acute exacerbation ([place]) [J47.1]   . Acute on chronic respiratory failure with hypoxia ([facility]) [J96.21]   . Essential hypertension [I10] 03/07/2018  . Steroid-induced hyperglycemia [R73.9, T38.0X5A] 03/07/2018  . COPD (chronic obstructive pulmonary disease) ([place]) [J44.9] 03/06/2018  . HCAP (healthcare-associated pneumonia) [J18.9] 01/28/2018  . Taking multiple medications for chronic disease [R69] 03/08/2017  . 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 Loss of weight [R63.4] 12/10/2016  . Goals of care, counseling/discussion [Z71.89] 12/10/2016  . Esophageal dysphagia [R13.10] 12/10/2016  . Abdominal pain, epigastric [R10.13] 12/10/2016  . Adenocarcinoma of right lung ([place]) [C34.91] 07/23/2016  . GERD [K21.9] 01/13/2009  . ACTINIC KERATOSIS [L57.0] 01/13/2009  . Anxiety state [F41.1] 12/27/2008  . Depression with anxiety [F41.8] 12/27/2008  . ASTHMA [J45.909] 12/27/2008  . OSTEOARTHRITIS [M19.90] 12/27/2008  . LOW BACK PAIN [M54.5] 12/27/2008    Past Medical History Past Medical History:  Diagnosis Date  . Arnold-Chiari syndrome ([place])   . Arthritis   . Asthma   . Chronic back pain   . COPD (chronic obstructive pulmonary disease) ([place])   . Depression   . Dyspnea    with exertion   . GERD (gastroesophageal reflux disease)   . Hypertension   . Pneumonia   . Pulmonary embolus ([place])   . Seasonal allergies   . Spinal stenosis of lumbar region   . 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 Wheezing     Past Surgical History Past Surgical History:  Procedure Laterality Date  . APPLICATION OF WOUND VAC  04/19/2018   Procedure: APPLICATION OF WOUND VAC;  Surgeon: [name], MD;  Location: [facility];  Service: Vascular;;  . APPLICATION OF WOUND VAC Right 04/21/2018   Procedure: WOUND VAC CHANGE;  Surgeon: [name], MD;  Location: [place];  Service: Thoracic;  Laterality: Right;  . APPLICATION OF WOUND VAC N/A 04/24/2018   Procedure: WOUND VAC CHANGE;  Surgeon: [name], MD;  Location: [facility];  Service: Thoracic;  Laterality: N/A;  . BACK SURGERY     5 total  .  BIOPSY  01/03/2017   Procedure: BIOPSY;  Surgeon: Daneil Dolin, MD;  [facility];  Service: Endoscopy;;  gastric  . CHEST TUBE INSERTION Right 04/19/2018   Procedure: CHEST TUBE INSERTION;  Surgeon: [name], MD;  Location: [facility];  Service: Vascular;  Laterality: Right;  . CHEST TUBE INSERTION Right 04/19/2018   Procedure: CHEST TUBE INSERTION;  Surgeon: [name], MD;  Location: [facility];  Service: Thoracic;  Laterality: Right;  . COLONOSCOPY WITH PROPOFOL N/A 04/04/2017   Procedure: COLONOSCOPY WITH PROPOFOL;  Surgeon: Daneil Dolin, MD;  [facility];  Service: Endoscopy;  Laterality: N/A;  8:45am  . DECORTICATION Right 03/28/2018   Procedure: DECORTICATION;  Surgeon: [name], MD;  [facility];  Service: Thoracic;  Laterality: Right;  . ESOPHAGOGASTRODUODENOSCOPY (EGD) WITH PROPOFOL N/A 01/03/2017   Procedure: ESOPHAGOGASTRODUODENOSCOPY (EGD) WITH PROPOFOL;  Surgeon: Daneil Dolin, MD;  [facility];  Service: Endoscopy;  Laterality: N/A;  . I&D EXTREMITY Right 04/19/2018   Procedure: IRRIGATION AND DEBRIDEMENT RIGHT CHEST WOUND;  Surgeon: [name], MD;  Location: [place];  Service: Vascular;  Laterality: Right;  . MALONEY DILATION N/A 01/03/2017   Procedure: Venia Minks DILATION;  Surgeon: Daneil Dolin, MD;   [facility];  Service: Endoscopy;  Laterality: N/A;  . MULTIPLE EXTRACTIONS WITH ALVEOLOPLASTY N/A 07/08/2014   Procedure: MULTIPLE EXTRACION WITH ALVEOLOPLASTY with EXCISION LESION RIGHT SIDE OF TONGUE;  Surgeon: Gae Bon, DDS;  Location: [facility];  Service: Oral Surgery;  Laterality: N/A;  . PORTACATH PLACEMENT Right 07/30/2016   Procedure: INSERTION PORT-A-CATH;  Surgeon: Vickie Epley, MD;  [facility];  Service: Vascular;  Laterality: Right;  . THORACOTOMY  04/19/2018   Procedure: THORACOTOMY MAJOR;  Surgeon: [name], MD;  Location: [facility];  Service: Vascular;;  . VIDEO ASSISTED THORACOSCOPY Right 04/21/2018   Procedure: VIDEO ASSISTED THORACOSCOPY;  Surgeon: [name], MD;  Location: [facility];  Service: Thoracic;  Laterality: Right;  [name] VIDEO ASSISTED THORACOSCOPY Right 04/24/2018   Procedure: VIDEO ASSISTED THORACOSCOPY;  Surgeon: [name], MD;  Location: [facility];  Service: Thoracic;  Laterality: Right;  [name] VIDEO ASSISTED THORACOSCOPY (VATS)/EMPYEMA Right 03/28/2018   Procedure: VIDEO ASSISTED THORACOSCOPY (VATS)/EMPYEMA;  Surgeon: [name], MD;  Location: [facility];  Service: Thoracic;  Laterality: Right;  [name] VIDEO BRONCHOSCOPY WITH ENDOBRONCHIAL ULTRASOUND N/A 07/19/2016   Procedure: VIDEO BRONCHOSCOPY WITH ENDOBRONCHIAL ULTRASOUND;  Surgeon: [name], MD;  Location: [facility];  Service: Thoracic;  Laterality: N/A;    Family History Family History  Problem Relation Age of Onset  . Cancer Mother        breast cancer  . Heart failure Brother   . Colon cancer Neg Hx        not sure, ?grandfather and/or uncle     Social History  reports that he quit smoking about 2 months ago. His smoking use included cigarettes. He has a 9.50 pack-year smoking history. He has quit using smokeless tobacco. His smokeless tobacco use included chew. He reports that he drinks about 1.2 oz of alcohol per week. He reports that he has current or  past drug history. Drug: Marijuana. Frequency: 7.00 times per week.  Medications  Current Outpatient Medications:  .  acetaminophen (TYLENOL) 325 MG tablet, Take 2 tablets (650 mg total) by mouth every 6 (six) hours as needed for mild pain (or Fever >/= 101)., Disp: , Rfl:  .  albuterol (PROVENTIL) (2.5 MG/3ML) 0.083% nebulizer solution, Take 3 mLs (2.5 mg total) by nebulization every 6 (six) hours as needed for wheezing or shortness of breath (if unable to experience relief by using combivent inhaler)., Disp: 75 mL, Rfl: 4 .  ALPRAZolam (XANAX) 1 MG tablet, Take 1 mg by mouth 2 (two) times daily as needed for anxiety or sleep (takes 1 tablet and bedtime for sleep and 1 dose during the day only if needed). , Disp: , Rfl:  .  apixaban (ELIQUIS) 5 MG TABS tablet, Take 1 tablet (5 mg total) by mouth 2 (two) times daily., Disp: 60 tablet, Rfl: 2 .  Artificial Tear Solution (SOOTHE XP) SOLN, Apply 2 drops to eye daily., Disp: , Rfl:  .  gabapentin (NEURONTIN) 600 MG tablet, Take 600 mg by mouth 2 (two) times daily. , Disp: , Rfl:  .  guaiFENesin (MUCINEX) 600 MG 12 hr tablet, Take 600 mg by mouth 2 (two) times daily as needed (for congestion.)., Disp: , Rfl:  .  Ipratropium-Albuterol (COMBIVENT) 20-100 MCG/ACT AERS respimat, Inhale 1 puff into the lungs every 6 (six) hours as needed for wheezing or shortness of breath., Disp: 1 Inhaler, Rfl: 3 .  lactulose (CHRONULAC) 10 GM/15ML solution, Take 15 mLs (10 g total) by mouth 3 (three) times daily as needed for mild constipation or moderate constipation., Disp: 240 mL, Rfl: 0 .  levalbuterol (XOPENEX) 0.63 MG/3ML nebulizer solution, Take 3 mLs (0.63 mg total) by nebulization every 8 (eight) hours as needed for wheezing or shortness of breath., Disp: 3 mL, Rfl: 12 .  linezolid (ZYVOX) 600 MG tablet, Take 1 tablet (600 mg total) by mouth 2 (two) times daily., Disp: 68 tablet, Rfl: 0 .  loperamide (IMODIUM) 2 MG capsule, Take 1 capsule (2 mg total) by mouth as  needed for diarrhea or loose stools., Disp: 20 capsule, Rfl: 0 .  methadone (DOLOPHINE) 10 MG tablet, Take 20 mg by mouth 5 (five) times daily. Takes 2 tablets 5 times daily, Disp: , Rfl:  .  mirtazapine (REMERON) 15 MG tablet, Take 1 tablet by mouth at bedtime., Disp: , Rfl:  .  Multiple Vitamin (MULTIVITAMIN WITH MINERALS) TABS tablet, Take 1 tablet by mouth daily. Centrum, Disp: , Rfl:  .  ondansetron (ZOFRAN) 8 MG tablet, TAKE 1 TABLET BY MOUTH EVERY 8 HOURS AS NEEDED FOR NAUSEA AND VOMITING., Disp: 60 tablet, Rfl: 1 .  OXYGEN, Inhale 3 L into the lungs., Disp: , Rfl:  .  pantoprazole (PROTONIX) 40 MG tablet, TAKE (1) TABLET BY MOUTH TWICE DAILY., Disp: 60 tablet, Rfl: 0 .  potassium chloride SA (K-DUR,KLOR-CON) 20 MEQ tablet, Take 1 tablet (20 mEq total) by mouth daily., Disp: 60 tablet, Rfl: 1 .  tiZANidine (ZANAFLEX) 4 MG tablet, Take 4 mg by mouth every 8 (eight) hours as needed for muscle spasms. , Disp: , Rfl:  .  varenicline (CHANTIX) 0.5 MG tablet, TAKE (2) TABLETS BY MOUTH TWICE DAILY., Disp: 112 tablet, Rfl: 0 .  Misc. Devices MISC, POC at 2 lpm via [place] with conserver / pulse ox on exertion., Disp: 1 each, Rfl: 0 No current facility-administered medications for this visit.   Facility-Administered Medications Ordered in Other Visits:  .  0.9 %  sodium chloride infusion, , Intravenous, Continuous, [place], Manon Hilding, PA-C, Last Rate: 10 mL/hr at 01/13/17 1330  Allergies Demeclocycline and Tetracyclines & related  Review of Systems Review of Systems - Oncology ROS as per HPI otherwise 12 point ROS is negative.  Physical Exam  Vitals Wt Readings from Last 3 Encounters:  05/03/18 213 lb (96.6 kg)  05/01/18 213 lb (96.6 kg)  04/18/18 210 lb (95.3 kg)   Temp Readings from Last 3 Encounters:  05/03/18 98.3 F (36.8 C) (Oral)  05/01/18 99 F (37.2 C) (Oral)  04/17/18 97.8 F (36.6 C) (Oral)   BP Readings from Last 3 Encounters:  05/03/18 (!) 138/100  05/01/18 (!) 138/91   04/18/18 108/70   Pulse Readings from Last 3 Encounters:  05/03/18 (!) 116  05/01/18 (!) 106  04/18/18 (!) 122    Constitutional: Well-developed, well-nourished, and in no distress.   HENT: Head: Normocephalic and atraumatic.  Mouth/Throat: No oropharyngeal exudate. Mucosa moist. Eyes: Pupils are equal, round, and reactive to light. Conjunctivae are normal. No scleral icterus.  Neck: Normal range of motion. Neck supple. No JVD present.  Cardiovascular: Normal rate, regular rhythm and normal heart sounds.  Exam reveals no gallop and no friction rub.   No murmur heard. Pulmonary/Chest: Coarse BS.  Right sided chest tube and wound Vac noted.   Abdominal: Soft. Bowel sounds are normal. No distension. There is no tenderness. There is no guarding.  Musculoskeletal: No edema or tenderness.  Lymphadenopathy: No cervical, axillary or supraclavicular adenopathy.  Neurological: Alert and oriented to person, place, and time. No cranial nerve deficit.  Skin: Skin is warm and dry. No rash noted. No erythema. No pallor.  Psychiatric: Affect and judgment normal.   Labs Admission on 04/18/2018, Discharged on 05/01/2018  Component Date Value Ref Range Status  . Sodium 04/18/2018 136  135 - 145 mmol/L Final  . Potassium 04/18/2018 3.4* 3.5 - 5.1 mmol/L Final  . Chloride 04/18/2018 94* 101 - 111 mmol/L Final  . CO2 04/18/2018 32  22 - 32 mmol/L Final  . Glucose, Bld 04/18/2018 106* 65 - 99 mg/dL Final  . BUN 04/18/2018 13  6 - 20 mg/dL Final  . Creatinine, Ser 04/18/2018 1.37* 0.61 - 1.24 mg/dL Final  . Calcium 04/18/2018 8.2* 8.9 - 10.3 mg/dL Final  . GFR calc non Af Amer 04/18/2018 57* >60 mL/min Final  . GFR calc Af Amer 04/18/2018 >60  >60 mL/min Final   Comment: (NOTE) The eGFR has been calculated using the CKD EPI equation. This calculation has not been validated in all clinical situations. eGFR's persistently <60 mL/min signify possible Chronic Kidney Disease.   . Anion gap 04/18/2018  10  5 - 15 Final   Performed at [facility], [place] [address], [place], [place] [address]  . WBC 04/18/2018 10.2  4.0 - 10.5 K/uL Final  . RBC 04/18/2018 3.44* 4.22 - 5.81 MIL/uL Final  . Hemoglobin 04/18/2018 9.8* 13.0 - 17.0 g/dL Final  . HCT 04/18/2018 31.5* 39.0 - 52.0 % Final  . MCV 04/18/2018 91.6  78.0 - 100.0 fL Final  . MCH 04/18/2018 28.5  26.0 - 34.0 pg Final  . MCHC 04/18/2018 31.1  30.0 - 36.0 g/dL Final  . RDW 04/18/2018 15.7* 11.5 - 15.5 % Final  . Platelets 04/18/2018 274  150 - 400 K/uL Final   Performed at [facility], [place] [address], [place], [place] [address]  . Prothrombin Time 04/18/2018 16.7* 11.4 - 15.2 seconds Final  . INR 04/18/2018 1.37   Final   Performed at [facility], [place] [address] [place], [place] [address]  . Color, Urine 04/18/2018 STRAW* YELLOW Final  . APPearance 04/18/2018 CLEAR  CLEAR Final  . Specific Gravity, Urine  04/18/2018 1.003* 1.005 - 1.030 Final  . pH 04/18/2018 7.0  5.0 - 8.0 Final  . Glucose, UA 04/18/2018 NEGATIVE  NEGATIVE mg/dL Final  . Hgb urine dipstick 04/18/2018 SMALL* NEGATIVE Final  . Bilirubin Urine 04/18/2018 NEGATIVE  NEGATIVE Final  . Ketones, ur 04/18/2018 NEGATIVE  NEGATIVE mg/dL Final  . Protein, ur 04/18/2018 NEGATIVE  NEGATIVE mg/dL Final  . Nitrite 04/18/2018 NEGATIVE  NEGATIVE Final  . Leukocytes, UA 04/18/2018 NEGATIVE  NEGATIVE Final  . RBC / HPF 04/18/2018 0-5  0 - 5 RBC/hpf Final  . WBC, UA 04/18/2018 0-5  0 - 5 WBC/hpf Final  . Bacteria, UA 04/18/2018 NONE SEEN  NONE SEEN Final   Performed at [facility], [place] [address], [place], [place] [address]  . Specimen Description 04/19/2018 TISSUE RIGHT CHEST   Final  . Special Requests 04/19/2018 NONE   Final  . Gram Stain 04/19/2018    Final                   Value:ABUNDANT WBC PRESENT,BOTH PMN AND MONONUCLEAR NO ORGANISMS SEEN   . Culture 04/19/2018    Final                   Value:RARE VANCOMYCIN RESISTANT ENTEROCOCCUS  ISOLATED CRITICAL RESULT CALLED TO, READ BACK BY AND VERIFIED WITH: A SMITHHEART,RN AT 1311 04/22/18 BY L BENFIELD NO ANAEROBES ISOLATED Performed at [facility], [place] [address], [place], [place] [address]   . Report Status 04/19/2018 04/24/2018 FINAL   Final  . Organism ID, Bacteria 04/19/2018 VANCOMYCIN RESISTANT ENTEROCOCCUS ISOLATED   Final  . WBC 04/20/2018 9.9  4.0 - 10.5 K/uL Final  . RBC 04/20/2018 3.40* 4.22 - 5.81 MIL/uL Final  . Hemoglobin 04/20/2018 9.7* 13.0 - 17.0 g/dL Final  . HCT 04/20/2018 31.6* 39.0 - 52.0 % Final  . MCV 04/20/2018 92.9  78.0 - 100.0 fL Final  . MCH 04/20/2018 28.5  26.0 - 34.0 pg Final  . MCHC 04/20/2018 30.7  30.0 - 36.0 g/dL Final  . RDW 04/20/2018 15.9* 11.5 - 15.5 % Final  . Platelets 04/20/2018 308  150 - 400 K/uL Final   Performed at [place] [address], [place], [place] [address]  . Sodium 04/20/2018 138  135 - 145 mmol/L Final  . Potassium 04/20/2018 3.7  3.5 - 5.1 mmol/L Final  . Chloride 04/20/2018 99* 101 - 111 mmol/L Final  . CO2 04/20/2018 31  22 - 32 mmol/L Final  . Glucose, Bld 04/20/2018 206* 65 - 99 mg/dL Final  . BUN 04/20/2018 12  6 - 20 mg/dL Final  . Creatinine, Ser 04/20/2018 1.36* 0.61 - 1.24 mg/dL Final  . Calcium 04/20/2018 7.7* 8.9 - 10.3 mg/dL Final  . GFR calc non Af Amer 04/20/2018 57* >60 mL/min Final  . GFR calc Af Amer 04/20/2018 >60  >60 mL/min Final   Comment: (NOTE) The eGFR has been calculated using the CKD EPI equation. This calculation has not been validated in all clinical situations. eGFR's persistently <60 mL/min signify possible Chronic Kidney Disease.   Georgiann Hahn gap 04/20/2018 8  5 - 15 Final   Performed at [facility], [place] [address], [place], [place] [address]  . Vancomycin Tr 04/20/2018 27* 15 - 20 ug/mL Final   Comment: CRITICAL RESULT CALLED TO, READ BACK BY AND VERIFIED WITH: SPRADLING,S RN 04/20/2018 2305 JORDANS Performed at [facility], [place],  [place] [address]   . ABO/RH(D) 04/20/2018 O NEG   Final  . Antibody Screen 04/20/2018 NEG   Final  . Sample Expiration 04/20/2018    Final                   Value:04/23/2018 Performed at [facility], [place] [address], [place], [place] [address]   . MRSA, PCR 04/21/2018 NEGATIVE  NEGATIVE Final  . Staphylococcus aureus 04/21/2018 NEGATIVE  NEGATIVE Final   Comment: (NOTE) The Xpert SA Assay (FDA approved for NASAL specimens in patients 13 years of age and older), is one component of a comprehensive surveillance program. It is not intended to diagnose infection nor to guide or monitor treatment. Performed at [facility], [place] [address], [place], [place] [address]   . ABO/RH(D) 04/23/2018 O NEG   Final  . Antibody Screen 04/23/2018 NEG   Final  . Sample Expiration 04/23/2018    Final                   Value:04/26/2018 Performed at [facility], [place] [address] [place], [place] [address]   . WBC 04/23/2018 7.5  4.0 - 10.5 K/uL Final  . RBC 04/23/2018 3.09* 4.22 - 5.81 MIL/uL Final  . Hemoglobin 04/23/2018 8.9* 13.0 - 17.0 g/dL Final  . HCT 04/23/2018 29.6* 39.0 - 52.0 % Final  . MCV 04/23/2018 95.8  78.0 - 100.0 fL Final  . MCH 04/23/2018 28.8  26.0 - 34.0 pg Final  . MCHC 04/23/2018 30.1  30.0 - 36.0 g/dL Final  . RDW 04/23/2018 16.1* 11.5 - 15.5 % Final  . Platelets 04/23/2018 235  150 - 400 K/uL Final   Performed at [facility], [place] [address] [place], [place] [address]  . Sodium 04/23/2018 141  135 - 145 mmol/L Final  . Potassium 04/23/2018 3.5  3.5 - 5.1 mmol/L Final  . Chloride 04/23/2018 103  101 - 111 mmol/L Final  . CO2 04/23/2018 30  22 - 32 mmol/L Final  . Glucose, Bld 04/23/2018 97  65 - 99 mg/dL Final  . BUN 04/23/2018 11  6 - 20 mg/dL Final  . Creatinine, Ser 04/23/2018 1.53* 0.61 - 1.24 mg/dL Final  . Calcium 04/23/2018 7.8* 8.9 - 10.3 mg/dL Final  . GFR calc non Af Amer 04/23/2018 50* >60 mL/min Final  . GFR calc Af Amer 04/23/2018  57* >60 mL/min Final   Comment: (NOTE) The eGFR has been calculated using the CKD EPI equation. This calculation has not been validated in all clinical situations. eGFR's persistently <60 mL/min signify possible Chronic Kidney Disease.   Georgiann Hahn gap 04/23/2018 8  5 - 15 Final   Performed at [facility], [place] [address] [place],  [address]  . Sodium 04/26/2018 146* 135 - 145 mmol/L Final  . Potassium 04/26/2018 3.9  3.5 - 5.1 mmol/L Final  . Chloride 04/26/2018 107  101 - 111 mmol/L Final  . CO2 04/26/2018 31  22 - 32 mmol/L Final  . Glucose, Bld 04/26/2018 89  65 - 99 mg/dL Final  . BUN 04/26/2018 12  6 - 20 mg/dL Final  . Creatinine, Ser 04/26/2018 1.34* 0.61 - 1.24 mg/dL Final  . Calcium 04/26/2018 8.3* 8.9 - 10.3 mg/dL Final  . GFR calc non Af Amer 04/26/2018 58* >60 mL/min Final  . GFR calc Af Amer 04/26/2018 >60  >60 mL/min Final   Comment: (NOTE) The eGFR has been calculated using  the CKD EPI equation. This calculation has not been validated in all clinical situations. eGFR's persistently <60 mL/min signify possible Chronic Kidney Disease.   Georgiann Hahn gap 04/26/2018 8  5 - 15 Final   Performed at [facility], [place] [address] [place], [place] [address]  . WBC 04/26/2018 7.2  4.0 - 10.5 K/uL Final  . RBC 04/26/2018 3.45* 4.22 - 5.81 MIL/uL Final  . Hemoglobin 04/26/2018 10.0* 13.0 - 17.0 g/dL Final  . HCT 04/26/2018 33.4* 39.0 - 52.0 % Final  . MCV 04/26/2018 96.8  78.0 - 100.0 fL Final  . MCH 04/26/2018 29.0  26.0 - 34.0 pg Final  . MCHC 04/26/2018 29.9* 30.0 - 36.0 g/dL Final  . RDW 04/26/2018 16.4* 11.5 - 15.5 % Final  . Platelets 04/26/2018 240  150 - 400 K/uL Final   Performed at [facility], [place] [address] [place], [place] [address]     Pathology Orders Placed This Encounter  Procedures  . CBC with Differential/Platelet    Standing Status:   Future    Standing Expiration Date:   05/04/2019  . Comprehensive metabolic panel    Standing  Status:   Future    Standing Expiration Date:   05/04/2019  . Lactate dehydrogenase    Standing Status:   Future    Standing Expiration Date:   05/04/2019       Zoila Shutter MD

## 2018-05-03 NOTE — Patient Instructions (Signed)
[facility] Discharge Instructions  Today you saw Dr. Higgs.    Thank you for choosing [facility] at [facility] to provide your oncology and hematology care.  To afford each patient quality time with our provider, please arrive at least 15 minutes before your scheduled appointment time.   If you have a lab appointment with the Cancer Center please come in thru the  Main Entrance and check in at the main information desk  You need to re-schedule your appointment should you arrive 10 or more minutes late.  We strive to give you quality time with our providers, and arriving late affects you and other patients whose appointments are after yours.  Also, if you no show three or more times for appointments you may be dismissed from the clinic at the providers discretion.     Again, thank you for choosing [facility].  Our hope is that these requests will decrease the amount of time that you wait before being seen by our physicians.       _____________________________________________________________  Should you have questions after your visit to  Cancer Center, please contact our office at [phone] between the hours of 8:30 a.m. and 4:30 p.m.  Voicemails left after 4:30 p.m. will not be returned until the following business day.  For prescription refill requests, have your pharmacy contact our office.       Resources For Cancer Patients and their Caregivers ? American Cancer Society: Can assist with transportation, wigs, general needs, runs Look Good Feel Better.        1-888-227-6333 ? Cancer Care: Provides financial assistance, online support groups, medication/co-pay assistance.  1-800-813-HOPE (4673) ? [facility] Assists [place] cancer patients and their families through emotional , educational and financial support.  336-427-4357 ? [place] DSS Where to apply for  food stamps, Medicaid and utility assistance. 336-342-1394 ? [facility]: Transportation to medical appointments. 336-347-2287 ? Social Security Administration: May apply for disability if have a Stage IV cancer. 336-342-7796 1-800-772-1213 ? [place] Aging, Disability and Transit Services: Assists with nutrition, care and transit needs. 336-349-2343  Cancer Center Support Programs:   > Cancer Support Group  2nd Tuesday of the month 1pm-2pm, Journey Room   > Creative Journey  3rd Tuesday of the month 1130am-1pm, Journey Room    

## 2018-05-08 ENCOUNTER — Other Ambulatory Visit: Payer: Self-pay | Admitting: Thoracic Surgery (Cardiothoracic Vascular Surgery)

## 2018-05-08 DIAGNOSIS — C3491 Malignant neoplasm of unspecified part of right bronchus or lung: Secondary | ICD-10-CM

## 2018-05-09 ENCOUNTER — Other Ambulatory Visit: Payer: Self-pay | Admitting: *Deleted

## 2018-05-09 ENCOUNTER — Encounter: Payer: Self-pay | Admitting: Thoracic Surgery (Cardiothoracic Vascular Surgery)

## 2018-05-09 ENCOUNTER — Other Ambulatory Visit: Payer: Self-pay

## 2018-05-09 ENCOUNTER — Other Ambulatory Visit (HOSPITAL_COMMUNITY): Payer: Self-pay | Admitting: Adult Health

## 2018-05-09 ENCOUNTER — Ambulatory Visit (INDEPENDENT_AMBULATORY_CARE_PROVIDER_SITE_OTHER): Payer: Self-pay | Admitting: Thoracic Surgery (Cardiothoracic Vascular Surgery)

## 2018-05-09 ENCOUNTER — Ambulatory Visit
Admission: RE | Admit: 2018-05-09 | Discharge: 2018-05-09 | Disposition: A | Discharge status: Home / Self Care | Payer: Medicare Other | Source: Ambulatory Visit | Attending: Thoracic Surgery (Cardiothoracic Vascular Surgery) | Admitting: Thoracic Surgery (Cardiothoracic Vascular Surgery)

## 2018-05-09 VITALS — BP 144/91 | HR 114 | Temp 98.5°F | Resp 18 | Ht 72.0 in | Wt 206.0 lb

## 2018-05-09 DIAGNOSIS — J869 Pyothorax without fistula: Secondary | ICD-10-CM

## 2018-05-09 DIAGNOSIS — C3491 Malignant neoplasm of unspecified part of right bronchus or lung: Secondary | ICD-10-CM

## 2018-05-09 DIAGNOSIS — T148XXA Other injury of unspecified body region, initial encounter: Secondary | ICD-10-CM

## 2018-05-09 DIAGNOSIS — L089 Local infection of the skin and subcutaneous tissue, unspecified: Secondary | ICD-10-CM

## 2018-05-09 DIAGNOSIS — Z85118 Personal history of other malignant neoplasm of bronchus and lung: Secondary | ICD-10-CM

## 2018-05-09 DIAGNOSIS — K219 Gastro-esophageal reflux disease without esophagitis: Secondary | ICD-10-CM

## 2018-05-09 DIAGNOSIS — Z09 Encounter for follow-up examination after completed treatment for conditions other than malignant neoplasm: Secondary | ICD-10-CM

## 2018-05-09 NOTE — Progress Notes (Signed)
[address]       [place] [address]             [phone]     Erik [name] returns for follow-up of his empyema and wound infection.  Erik [name] is a 57 year old man who was diagnosed with stage IIIa lung cancer in 2017.  He exceptionally progressed to stage IV disease.  His past history is also significant for COPD, DVT, chronic pain, depression, anxiety, and hypertension.  He was admitted in April with shortness of breath and right-sided chest pain.  He had air-fluid levels in his right chest.  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  I did a right VATS and decortication on 03/28/2018.  He initially had an air leak but that resolved.  He was discharged on 04/08/2018.  He returned to the office on 04/21/2018 with a fever and tachycardia.  Chest x-ray showed air-fluid level in the chest as well as in the subcutaneous tissue.  I taken the OR and opened his chest wound and also did VATS and placed a new chest drain.  Cultures grew Vanco resistant enterococcus and he was treated with an linezolid.  He was discharged with a chest tube and a VAC in place.  He says he is feeling better.  He has noticed swelling in his left neck.  He is not having any pain with VAC changes at this point.  He is draining about 100 mL of fluid a day from his chest tube.  Past Medical History:  Diagnosis Date  . 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 Arthritis   . Asthma   . Chronic back pain   . COPD (chronic obstructive pulmonary disease) ([place])   . Depression   . Dyspnea    with exertion   . GERD (gastroesophageal reflux disease)   . Hypertension   . Pneumonia   . Pulmonary embolus ([place])   . Seasonal allergies   . Spinal stenosis of lumbar region   . 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 Wheezing     Current Outpatient Medications  Medication Sig Dispense Refill  . acetaminophen (TYLENOL) 325 MG tablet Take 2 tablets (650 mg total) by  mouth every 6 (six) hours as needed for mild pain (or Fever >/= 101).    [name] albuterol (PROVENTIL) (2.5 MG/3ML) 0.083% nebulizer solution Take 3 mLs (2.5 mg total) by nebulization every 6 (six) hours as needed for wheezing or shortness of breath (if unable to experience relief by using combivent inhaler). 75 mL 4  . ALPRAZolam (XANAX) 1 MG tablet Take 1 mg by mouth 2 (two) times daily as needed for anxiety or sleep (takes 1 tablet and bedtime for sleep and 1 dose during the day only if needed).     [name] apixaban (ELIQUIS) 5 MG TABS tablet Take 1 tablet (5 mg total) by mouth 2 (two) times daily. 60 tablet 2  . Artificial Tear Solution (SOOTHE XP) SOLN Apply 2 drops to eye daily.    [name] gabapentin (NEURONTIN) 600 MG tablet Take 600 mg by mouth 2 (two) times daily.     [name] guaiFENesin (MUCINEX) 600 MG 12 hr tablet Take 600 mg by mouth 2 (two) times daily as needed (for congestion.).    [name] Ipratropium-Albuterol (COMBIVENT) 20-100 MCG/ACT AERS respimat Inhale 1 puff into the lungs every 6 (six) hours as needed for wheezing or shortness of breath. 1 Inhaler 3  .  lactulose (CHRONULAC) 10 GM/15ML solution Take 15 mLs (10 g total) by mouth 3 (three) times daily as needed for mild constipation or moderate constipation. 240 mL 0  . levalbuterol (XOPENEX) 0.63 MG/3ML nebulizer solution Take 3 mLs (0.63 mg total) by nebulization every 8 (eight) hours as needed for wheezing or shortness of breath. 3 mL 12  . linezolid (ZYVOX) 600 MG tablet Take 1 tablet (600 mg total) by mouth 2 (two) times daily. 68 tablet 0  . loperamide (IMODIUM) 2 MG capsule Take 1 capsule (2 mg total) by mouth as needed for diarrhea or loose stools. 20 capsule 0  . methadone (DOLOPHINE) 10 MG tablet Take 20 mg by mouth 5 (five) times daily. Takes 2 tablets 5 times daily    . mirtazapine (REMERON) 15 MG tablet Take 1 tablet by mouth at bedtime.    . Misc. Devices MISC POC at 2 lpm via [place] with conserver / pulse ox on exertion. 1 each 0  . Multiple Vitamin  (MULTIVITAMIN WITH MINERALS) TABS tablet Take 1 tablet by mouth daily. Centrum    . ondansetron (ZOFRAN) 8 MG tablet TAKE 1 TABLET BY MOUTH EVERY 8 HOURS AS NEEDED FOR NAUSEA AND VOMITING. 60 tablet 1  . OXYGEN Inhale 3 L into the lungs.    . pantoprazole (PROTONIX) 40 MG tablet TAKE (1) TABLET BY MOUTH TWICE DAILY. 60 tablet 0  . potassium chloride SA (K-DUR,KLOR-CON) 20 MEQ tablet Take 1 tablet (20 mEq total) by mouth daily. 60 tablet 1  . tiZANidine (ZANAFLEX) 4 MG tablet Take 4 mg by mouth every 8 (eight) hours as needed for muscle spasms.     . varenicline (CHANTIX) 0.5 MG tablet TAKE (2) TABLETS BY MOUTH TWICE DAILY. 112 tablet 0   No current facility-administered medications for this visit.    Facility-Administered Medications Ordered in Other Visits  Medication Dose Route Frequency Provider Last Rate Last Dose  . 0.9 %  sodium chloride infusion   Intravenous Continuous [facility], PA-C 10 mL/hr at 01/13/17 1330      Physical Exam BP (!) 144/91 (BP Location: Right Arm, Patient Position: Sitting, Cuff Size: Normal)   Pulse (!) 114   Temp 98.5 F (36.9 C) (Oral)   Resp 18   Ht 6' (1.829 m)   Wt 206 lb (93.4 kg)   SpO2 97% Comment: RA  BMI 27.44 kg/m  57 year old man in no acute distress Alert and oriented x3 with no focal deficits Palpable mass in left supra clavicular fossa Lungs markedly diminished breath sounds on right, left lung clear Serous drainage from chest tube Right chest wound and chest tube site granulating over 99% of surface with minimal exudate  Diagnostic Tests: CHEST - 2 VIEW  COMPARISON:  Chest x-ray dated 04/29/2018 and 04/27/2018.  FINDINGS: RIGHT-sided chest tube remains in place, with tip at the RIGHT lung base. RIGHT chest wall Port-A-Cath appears stable in position with tip at the level of the lower SVC/cavoatrial junction.  Slightly improved aeration within the RIGHT lung compared to the previous study. Presumed volume loss,  essentially stable. LEFT lung remains clear. No pneumothorax seen.  IMPRESSION: 1. Presumed postsurgical volume loss within the RIGHT hemithorax, likely with associated atelectasis and/or pleural effusion. RIGHT-sided chest tube remains in place. Aeration is slightly improved compared to previous study of 04/29/2018. 2. No acute findings.  LEFT lung remains clear.   Electronically Signed   By: Franki Cabot M.D.   On: 05/09/2018 15:25 I personally reviewed the chest  x-ray images and concur with the findings noted above  Impression: Erik [name] is a 57 year old man with stage IV lung cancer originally stage IIIa treated with chemo and radiation.  He was treated with Imfinzi but had progression of disease.  He has been on Tecentriq since August 2018.  That is currently on hold as he recovers from his infection.  He had a chronic right pleural effusion following chemo and radiation.  That is been present for about a year.  He then presented with pneumonia with a right empyema.  I did a VATS on him but was unable to get the lung to completely reexpand.  He did well initially but developed recurrence air-fluid levels in the chest with extension into the chest wall.  I had to taken back to the operating room for I&D of his chest wound and placement of a new chest tube.  He is on linezolid for VRE empyema.  He looks much better today.  He is having minimal discomfort with the dressing changes.  His chest wound is granulating in nicely.  His chest tube site is granulating nicely as well and I do not think that needs the [facility] anymore can just be treated with wet-to-dry's.  His chest tube has clear serous drainage.  I want to keep that tube in for now.  His x-ray did show a little improvement in his aeration on the right side.  I am concerned about the increase in size of the left supraclavicular mass.  I will arrange an ultrasound-guided needle biopsy of that to see if there is cancer involvement.  I  suspect there is.   Plan: Continue VAC to chest wound Continue pleural drain We will arrange ultrasound-guided biopsy of left supraclavicular lymph nodes.  I am concerned he has cancer in those nodes. Return in 1 week  [name], MD [facility] [phone]

## 2018-05-09 NOTE — Progress Notes (Unsigned)
Delorse Lek

## 2018-05-10 ENCOUNTER — Other Ambulatory Visit: Payer: Self-pay | Admitting: *Deleted

## 2018-05-10 DIAGNOSIS — Z85118 Personal history of other malignant neoplasm of bronchus and lung: Secondary | ICD-10-CM

## 2018-05-11 ENCOUNTER — Encounter: Payer: Self-pay | Admitting: Internal Medicine

## 2018-05-12 ENCOUNTER — Other Ambulatory Visit: Payer: Self-pay | Admitting: Radiology

## 2018-05-15 ENCOUNTER — Ambulatory Visit (HOSPITAL_COMMUNITY)
Admission: RE | Admit: 2018-05-15 | Discharge: 2018-05-15 | Disposition: A | Discharge status: Home / Self Care | Payer: Medicare Other | Source: Ambulatory Visit | Attending: Thoracic Surgery (Cardiothoracic Vascular Surgery) | Admitting: Thoracic Surgery (Cardiothoracic Vascular Surgery)

## 2018-05-15 ENCOUNTER — Encounter (HOSPITAL_COMMUNITY): Payer: Self-pay

## 2018-05-15 DIAGNOSIS — C770 Secondary and unspecified malignant neoplasm of lymph nodes of head, face and neck: Secondary | ICD-10-CM | POA: Insufficient documentation

## 2018-05-15 DIAGNOSIS — Z86711 Personal history of pulmonary embolism: Secondary | ICD-10-CM | POA: Insufficient documentation

## 2018-05-15 DIAGNOSIS — M48061 Spinal stenosis, lumbar region without neurogenic claudication: Secondary | ICD-10-CM | POA: Diagnosis not present

## 2018-05-15 DIAGNOSIS — K219 Gastro-esophageal reflux disease without esophagitis: Secondary | ICD-10-CM | POA: Diagnosis not present

## 2018-05-15 DIAGNOSIS — F419 Anxiety disorder, unspecified: Secondary | ICD-10-CM | POA: Insufficient documentation

## 2018-05-15 DIAGNOSIS — F329 Major depressive disorder, single episode, unspecified: Secondary | ICD-10-CM | POA: Insufficient documentation

## 2018-05-15 DIAGNOSIS — Z923 Personal history of irradiation: Secondary | ICD-10-CM | POA: Diagnosis not present

## 2018-05-15 DIAGNOSIS — J90 Pleural effusion, not elsewhere classified: Secondary | ICD-10-CM | POA: Diagnosis not present

## 2018-05-15 DIAGNOSIS — J439 Emphysema, unspecified: Secondary | ICD-10-CM | POA: Diagnosis not present

## 2018-05-15 DIAGNOSIS — Z9221 Personal history of antineoplastic chemotherapy: Secondary | ICD-10-CM | POA: Insufficient documentation

## 2018-05-15 DIAGNOSIS — Z79899 Other long term (current) drug therapy: Secondary | ICD-10-CM | POA: Diagnosis not present

## 2018-05-15 DIAGNOSIS — Z86718 Personal history of other venous thrombosis and embolism: Secondary | ICD-10-CM | POA: Insufficient documentation

## 2018-05-15 DIAGNOSIS — C3490 Malignant neoplasm of unspecified part of unspecified bronchus or lung: Secondary | ICD-10-CM | POA: Insufficient documentation

## 2018-05-15 DIAGNOSIS — I10 Essential (primary) hypertension: Secondary | ICD-10-CM | POA: Insufficient documentation

## 2018-05-15 DIAGNOSIS — Z7901 Long term (current) use of anticoagulants: Secondary | ICD-10-CM | POA: Insufficient documentation

## 2018-05-15 DIAGNOSIS — G8929 Other chronic pain: Secondary | ICD-10-CM | POA: Insufficient documentation

## 2018-05-15 DIAGNOSIS — Z85118 Personal history of other malignant neoplasm of bronchus and lung: Secondary | ICD-10-CM

## 2018-05-15 DIAGNOSIS — Z87891 Personal history of nicotine dependence: Secondary | ICD-10-CM | POA: Diagnosis not present

## 2018-05-15 LAB — CBC
HEMATOCRIT: 33.2 % — AB (ref 39.0–52.0)
Hemoglobin: 10.1 g/dL — ABNORMAL LOW (ref 13.0–17.0)
MCH: 28.2 pg (ref 26.0–34.0)
MCHC: 30.4 g/dL (ref 30.0–36.0)
MCV: 92.7 fL (ref 78.0–100.0)
Platelets: 257 10*3/uL (ref 150–400)
RBC: 3.58 MIL/uL — ABNORMAL LOW (ref 4.22–5.81)
RDW: 16.7 % — AB (ref 11.5–15.5)
WBC: 7 10*3/uL (ref 4.0–10.5)

## 2018-05-15 LAB — PROTIME-INR
INR: 1.19
Prothrombin Time: 15 seconds (ref 11.4–15.2)

## 2018-05-15 MED ORDER — FENTANYL CITRATE (PF) 100 MCG/2ML IJ SOLN
INTRAMUSCULAR | Status: AC
  Filled 2018-05-15: qty 2

## 2018-05-15 MED ORDER — MIDAZOLAM HCL 2 MG/2ML IJ SOLN
INTRAMUSCULAR | Status: AC
  Filled 2018-05-15: qty 2

## 2018-05-15 MED ORDER — SODIUM CHLORIDE 0.9 % IV SOLN: INTRAVENOUS | Status: DC

## 2018-05-15 MED ORDER — MIDAZOLAM HCL 2 MG/2ML IJ SOLN
INTRAMUSCULAR | Status: AC | PRN
  Administered 2018-05-15 (×2): 0.5 mg via INTRAVENOUS
  Administered 2018-05-15: 1 mg via INTRAVENOUS

## 2018-05-15 MED ORDER — SODIUM CHLORIDE 0.9 % IV SOLN
INTRAVENOUS | Status: AC | PRN
  Administered 2018-05-15: 10 mL/h via INTRAVENOUS

## 2018-05-15 MED ORDER — LIDOCAINE HCL (PF) 1 % IJ SOLN
INTRAMUSCULAR | Status: AC
  Filled 2018-05-15: qty 30

## 2018-05-15 MED ORDER — FENTANYL CITRATE (PF) 100 MCG/2ML IJ SOLN
INTRAMUSCULAR | Status: AC | PRN
  Administered 2018-05-15: 25 ug via INTRAVENOUS
  Administered 2018-05-15: 50 ug via INTRAVENOUS
  Administered 2018-05-15: 25 ug via INTRAVENOUS

## 2018-05-15 NOTE — Discharge Instructions (Addendum)
Needle Biopsy, Care After °Refer to this sheet in the next few weeks. These instructions provide you with information about caring for yourself after your procedure. Your health care provider may also give you more specific instructions. Your treatment has been planned according to current medical practices, but problems sometimes occur. Call your health care provider if you have any problems or questions after your procedure. °What can I expect after the procedure? °After your procedure, it is common to have soreness, bruising, or mild pain at the biopsy site. This should go away in a few days. °Follow these instructions at home: °· Rest as directed by your health care provider. °· Take medicines only as directed by your health care provider. °· There are many different ways to close and cover the biopsy site, including stitches (sutures), skin glue, and adhesive strips. Follow your health care provider's instructions about: °? Biopsy site care. °? Bandage (dressing) changes and removal. °? Biopsy site closure removal. °· Check your biopsy site every day for signs of infection. Watch for: °? Redness, swelling, or pain. °? Fluid, blood, or pus. °Contact a health care provider if: °· You have a fever. °· You have redness, swelling, or pain at the biopsy site that lasts longer than a few days. °· You have fluid, blood, or pus coming from the biopsy site. °· You feel nauseous. °· You vomit. °Get help right away if: °· You have shortness of breath. °· You have trouble breathing. °· You have chest pain. °· You feel dizzy or you faint. °· You have bleeding that does not stop with pressure or a bandage. °· You cough up blood. °· You have pain in your abdomen. °This information is not intended to replace advice given to you by your health care provider. Make sure you discuss any questions you have with your health care provider. °Document Released: 04/08/2015 Document Revised: 04/29/2016 Document Reviewed:  11/18/2014 °Elsevier Interactive Patient Education © 2018 [employer] ° °

## 2018-05-15 NOTE — Procedures (Signed)
Interventional Radiology Procedure Note  Procedure: US guided core biopsy of left supraclavicular lymph node   Complications: None  Estimated Blood Loss: < 10 mL  Findings: Enlarged left supraclavicular lymph node sampled with 18 G core biopsy x 3 under US guidance.  [name]. Kathlene Cote, M.D Pager:  [phone]

## 2018-05-15 NOTE — H&P (Signed)
Chief Complaint: Lung Cancer  Referring Physician(s): [place] [facility]  Supervising Physician: Aletta Edouard  Patient Status: [facility] - Out-pt  History of Present Illness: Erik [name] is a 57 y.o. male with stage IV lung cancer initially diagnosed in 2017.  His past history is also significant for COPD, DVT, chronic pain, depression, anxiety, and hypertension.    He was admitted in April with shortness of breath and right-sided chest pain.    He had air-fluid levels in his right chest.    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    He underwent a right VATS and decortication on 03/28/2018 by Dr. Roxan Hockey.  He initially had an air leak but that resolved.  He was discharged on 04/08/2018.  He returned to Dr. Leonarda Salon office on 04/21/2018 with a fever and tachycardia.    Chest x-ray showed air-fluid level in the chest as well as in the subcutaneous tissue.    He was taken the OR for a VATS and a new chest drain.    He did have some lymphadenopathy on CT scan done in April.  We are asked to perform a core biopsy today.  He is NPO. He takes Eliquis. He has held this medication x 3 days.  Past Medical History:  Diagnosis Date  . Arnold-Chiari syndrome ([place])   . Arthritis   . Asthma   . Chronic back pain   . COPD (chronic obstructive pulmonary disease) ([place])   . Depression   . Dyspnea    with exertion   . GERD (gastroesophageal reflux disease)   . Hypertension   . Pneumonia   . Pulmonary embolus ([place])   . Seasonal allergies   . Spinal stenosis of lumbar region   . 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 Wheezing     Past Surgical History:  Procedure Laterality Date  . APPLICATION OF WOUND VAC  04/19/2018   Procedure: APPLICATION OF WOUND VAC;  Surgeon: [name], MD;  Location: [place];  Service: Vascular;;  . APPLICATION OF WOUND VAC Right 04/21/2018   Procedure: WOUND VAC CHANGE;   Surgeon: [name], MD;  Location: [place];  Service: Thoracic;  Laterality: Right;  . APPLICATION OF WOUND VAC N/A 04/24/2018   Procedure: WOUND VAC CHANGE;  Surgeon: [name], MD;  Location: [place];  Service: Thoracic;  Laterality: N/A;  . BACK SURGERY     5 total  . BIOPSY  01/03/2017   Procedure: BIOPSY;  Surgeon: Daneil Dolin, MD;  [facility];  Service: Endoscopy;;  gastric  . CHEST TUBE INSERTION Right 04/19/2018   Procedure: CHEST TUBE INSERTION;  Surgeon: [name], MD;  Location: [place];  Service: Vascular;  Laterality: Right;  . CHEST TUBE INSERTION Right 04/19/2018   Procedure: CHEST TUBE INSERTION;  Surgeon: [name], MD;  Location: [place];  Service: Thoracic;  Laterality: Right;  . COLONOSCOPY WITH PROPOFOL N/A 04/04/2017   Procedure: COLONOSCOPY WITH PROPOFOL;  Surgeon: Daneil Dolin, MD;  [facility];  Service: Endoscopy;  Laterality: N/A;  8:45am  . DECORTICATION Right 03/28/2018   Procedure: DECORTICATION;  Surgeon: [name], MD;  [facility];  Service: Thoracic;  Laterality: Right;  . ESOPHAGOGASTRODUODENOSCOPY (EGD) WITH PROPOFOL N/A 01/03/2017   Procedure: ESOPHAGOGASTRODUODENOSCOPY (EGD) WITH PROPOFOL;  Surgeon: Daneil Dolin, MD;  [facility];  Service: Endoscopy;  Laterality: N/A;  .  I&D EXTREMITY Right 04/19/2018   Procedure: IRRIGATION AND DEBRIDEMENT RIGHT CHEST WOUND;  Surgeon: [name], MD;  Location: [place];  Service: Vascular;  Laterality: Right;  . MALONEY DILATION N/A 01/03/2017   Procedure: Venia Minks DILATION;  Surgeon: Daneil Dolin, MD;  [facility];  Service: Endoscopy;  Laterality: N/A;  . MULTIPLE EXTRACTIONS WITH ALVEOLOPLASTY N/A 07/08/2014   Procedure: MULTIPLE EXTRACION WITH ALVEOLOPLASTY with EXCISION LESION RIGHT SIDE OF TONGUE;  Surgeon: Gae Bon, DDS;  Location: [place];  Service: Oral Surgery;  Laterality: N/A;  . PORTACATH  PLACEMENT Right 07/30/2016   Procedure: INSERTION PORT-A-CATH;  Surgeon: Vickie Epley, MD;  Location: [facility];  Service: Vascular;  Laterality: Right;  . THORACOTOMY  04/19/2018   Procedure: THORACOTOMY MAJOR;  Surgeon: [name], MD;  Location: [place];  Service: Vascular;;  . VIDEO ASSISTED THORACOSCOPY Right 04/21/2018   Procedure: VIDEO ASSISTED THORACOSCOPY;  Surgeon: [name], MD;  Location: [place];  Service: Thoracic;  Laterality: Right;  [name] VIDEO ASSISTED THORACOSCOPY Right 04/24/2018   Procedure: VIDEO ASSISTED THORACOSCOPY;  Surgeon: [name], MD;  Location: [place];  Service: Thoracic;  Laterality: Right;  [name] VIDEO ASSISTED THORACOSCOPY (VATS)/EMPYEMA Right 03/28/2018   Procedure: VIDEO ASSISTED THORACOSCOPY (VATS)/EMPYEMA;  Surgeon: [name], MD;  Location: [place];  Service: Thoracic;  Laterality: Right;  [name] VIDEO BRONCHOSCOPY WITH ENDOBRONCHIAL ULTRASOUND N/A 07/19/2016   Procedure: VIDEO BRONCHOSCOPY WITH ENDOBRONCHIAL ULTRASOUND;  Surgeon: [name], MD;  Location: [facility] OR;  Service: Thoracic;  Laterality: N/A;    Allergies: Demeclocycline and Tetracyclines & related  Medications: Prior to Admission medications   Medication Sig Start Date End Date Taking? Authorizing Provider  acetaminophen (TYLENOL) 325 MG tablet Take 2 tablets (650 mg total) by mouth every 6 (six) hours as needed for mild pain (or Fever >/= 101). 05/01/18   Barrett, Erin R, PA-C  albuterol (PROVENTIL) (2.5 MG/3ML) 0.083% nebulizer solution Take 3 mLs (2.5 mg total) by nebulization every 6 (six) hours as needed for wheezing or shortness of breath (if unable to experience relief by using combivent inhaler). 03/08/18   Barton Dubois, MD  ALPRAZolam Duanne Moron) 1 MG tablet Take 1 mg by mouth 2 (two) times daily as needed for anxiety or sleep (takes 1 tablet and bedtime for sleep and 1 dose during the day only if needed).  06/26/16   [provider]  apixaban  (ELIQUIS) 5 MG TABS tablet Take 1 tablet (5 mg total) by mouth 2 (two) times daily. 01/23/18   Jacquelin Hawking, NP  Artificial Tear Solution (SOOTHE XP) SOLN Apply 2 drops to eye daily.    [provider]  gabapentin (NEURONTIN) 600 MG tablet Take 600 mg by mouth 2 (two) times daily.  08/19/17   [provider]  guaiFENesin (MUCINEX) 600 MG 12 hr tablet Take 600 mg by mouth 2 (two) times daily as needed (for congestion.).    [provider]  Ipratropium-Albuterol (COMBIVENT) 20-100 MCG/ACT AERS respimat Inhale 1 puff into the lungs every 6 (six) hours as needed for wheezing or shortness of breath. 03/08/18   Barton Dubois, MD  lactulose (CHRONULAC) 10 GM/15ML solution Take 15 mLs (10 g total) by mouth 3 (three) times daily as needed for mild constipation or moderate constipation. 01/23/18   Jacquelin Hawking, NP  levalbuterol Penne Lash) 0.63 MG/3ML nebulizer solution Take 3 mLs (0.63 mg total) by nebulization every 8 (eight) hours as needed for wheezing or shortness  of breath. 05/01/18   Barrett, Erin R, PA-C  linezolid (ZYVOX) 600 MG tablet Take 1 tablet (600 mg total) by mouth 2 (two) times daily. 05/01/18 06/04/18  Barrett, Erin R, PA-C  loperamide (IMODIUM) 2 MG capsule Take 1 capsule (2 mg total) by mouth as needed for diarrhea or loose stools. 04/08/18   Patrecia Pour, MD  methadone (DOLOPHINE) 10 MG tablet Take 20 mg by mouth 5 (five) times daily. Takes 2 tablets 5 times daily 06/26/16   [provider]  mirtazapine (REMERON) 15 MG tablet Take 1 tablet by mouth at bedtime. 08/17/17   [provider]  Misc. Devices MISC POC at 2 lpm via [place] with conserver / pulse ox on exertion. 05/03/18   Higgs, Mathis Dad, MD  Multiple Vitamin (MULTIVITAMIN WITH MINERALS) TABS tablet Take 1 tablet by mouth daily. Centrum    [provider]  ondansetron (ZOFRAN) 8 MG tablet TAKE 1 TABLET BY MOUTH EVERY 8 HOURS AS NEEDED FOR NAUSEA AND VOMITING. 03/02/18   Holley Bouche, NP   OXYGEN Inhale 3 L into the lungs.    [provider]  pantoprazole (PROTONIX) 40 MG tablet TAKE (1) TABLET BY MOUTH TWICE DAILY. 05/10/18   Higgs, Mathis Dad, MD  potassium chloride SA (K-DUR,KLOR-CON) 20 MEQ tablet Take 1 tablet (20 mEq total) by mouth daily. 03/14/18   Derek Jack, MD  tiZANidine (ZANAFLEX) 4 MG tablet Take 4 mg by mouth every 8 (eight) hours as needed for muscle spasms.  06/03/16   [provider]  varenicline (CHANTIX) 0.5 MG tablet TAKE (2) TABLETS BY MOUTH TWICE DAILY. 04/11/18   Holley Bouche, NP     Family History  Problem Relation Age of Onset  . Cancer Mother        breast cancer  . Heart failure Brother   . Colon cancer Neg Hx        not sure, ?grandfather and/or uncle    Social History   Socioeconomic History  . Marital status: Divorced    Spouse name: Not on file  . Number of children: Not on file  . Years of education: Not on file  . Highest education level: Not on file  Occupational History  . Not on file  Social Needs  . Financial resource strain: Not on file  . Food insecurity:    Worry: Not on file    Inability: Not on file  . Transportation needs:    Medical: Not on file    Non-medical: Not on file  Tobacco Use  . Smoking status: Former Smoker    Packs/day: 0.25    Years: 38.00    Pack years: 9.50    Types: Cigarettes    Last attempt to quit: 02/16/2018    Years since quitting: 0.2  . Smokeless tobacco: Former Systems developer    Types: Chew  . Tobacco comment: [place] STAY  Substance and Sexual Activity  . Alcohol use: Yes    Alcohol/week: 1.2 oz    Types: 2 Cans of beer per week    Comment: reports drinking 0-2 beers a week  . Drug use: Yes    Frequency: 7.0 times per week    Types: Marijuana    Comment: daily for cancer; pt denies any use at present time 08/23/17  . Sexual activity: Not Currently  Lifestyle  . Physical activity:    Days per week: Not on file    Minutes per session: Not on file  .  Stress: Not on file  Relationships  . Social connections:    Talks on phone: Not on file    Gets together: Not on file    Attends religious service: Not on file    Active member of club or organization: Not on file    Attends meetings of clubs or organizations: Not on file    Relationship status: Not on file  Other Topics Concern  . Not on file  Social History Narrative  . Not on file     Review of Systems: A 12 point ROS discussed and pertinent positives are indicated in the HPI above.  All other systems are negative. Review of Systems  Vital Signs: BP (!) 129/94   Pulse (!) 114   Temp 98 F (36.7 C) (Oral)   Resp 20   Ht 6' (1.829 m)   Wt 217 lb (98.4 kg)   SpO2 97% Comment: 2 liters  BMI 29.43 kg/m   Physical Exam  Constitutional: He is oriented to person, place, and time. He appears well-developed.  HENT:  Head: Normocephalic and atraumatic.  Eyes: EOM are normal.  Neck: Normal range of motion.    Large palpable lymph node  Cardiovascular: Normal rate, regular rhythm and normal heart sounds.  Pulmonary/Chest: Effort normal.  Coarse breath sounds bilaterally. Right chest tube in place.  Abdominal: Soft.  Musculoskeletal: Normal range of motion.  Neurological: He is alert and oriented to person, place, and time.  Skin: Skin is warm and dry.  Psychiatric: He has a normal mood and affect. His behavior is normal. Judgment and thought content normal.  Vitals reviewed.   Imaging: Dg Chest 2 View  Result Date: 05/09/2018 CLINICAL DATA:  Pt c/o adenocarcinoma of right lung. Pt complains of tiredness. Hx of right side VATS 04/19/18, COPD, asthma, HTN, pulmonary embolus. EXAM: CHEST - 2 VIEW COMPARISON:  Chest x-ray dated 04/29/2018 and 04/27/2018. FINDINGS: RIGHT-sided chest tube remains in place, with tip at the RIGHT lung base. RIGHT chest wall Port-A-Cath appears stable in position with tip at the level of the lower SVC/cavoatrial junction. Slightly improved aeration  within the RIGHT lung compared to the previous study. Presumed volume loss, essentially stable. LEFT lung remains clear. No pneumothorax seen. IMPRESSION: 1. Presumed postsurgical volume loss within the RIGHT hemithorax, likely with associated atelectasis and/or pleural effusion. RIGHT-sided chest tube remains in place. Aeration is slightly improved compared to previous study of 04/29/2018. 2. No acute findings.  LEFT lung remains clear. Electronically Signed   By: Franki Cabot M.D.   On: 05/09/2018 15:25   Dg Chest 2 View  Addendum Date: 04/18/2018   ADDENDUM REPORT: 04/18/2018 17:20 ADDENDUM: I called these results by telephone on 04/18/2018 at 5:19 pm to Dr. Servando Snare , who verbally acknowledged these results and will relay the information to Dr. Roxan Hockey. Electronically Signed   By: Franki Cabot M.D.   On: 04/18/2018 17:20   Result Date: 04/18/2018 CLINICAL DATA:  Pt c/o empyema of right lung since 03/28/18. Pt complains of tiredness. Hx of Arnold-Chiari syndrome, asthma, COPD, HTN, pneumonia, pulmonary embolus, right side lung caner 2017. EXAM: CHEST - 2 VIEW COMPARISON:  Chest x-rays dated 04/08/2018 and 04/07/2018. Chest CT dated 03/23/2018. FINDINGS: Compared to the recent chest x-rays, there is an increasing collection of air at the RIGHT lung base, with associated air-fluid levels compatible with the given history of empyema, presumably reaccumulating. There is also increasing subcutaneous emphysema over the RIGHT lateral chest wall, which may indicate associated fistulous connection.  LEFT lung is clear. Heart size and mediastinal contours are grossly stable. RIGHT chest wall Port-A-Cath is stable in position. IMPRESSION: 1. Increased collection of air at the RIGHT lung base compared to most recent chest x-ray of 04/08/2018, now measuring approximately 8.6 cm greatest dimension, with associated air-fluid level, compatible with the given history of empyema, presumably interval  worsening/reaccumulation. Consider follow-up chest CT for further characterization. 2. Also, increased subcutaneous emphysema over the RIGHT lateral chest wall. If no recent interventional procedure, this raises the possibility of underlying fistulous connection. Again, consider chest CT for further characterization. These results will be called to the ordering clinician or representative by the Radiologist Assistant, and communication documented in the PACS or zVision Dashboard. Electronically Signed: By: Franki Cabot M.D. On: 04/18/2018 16:58   Dg Chest Port 1 View  Result Date: 04/29/2018 CLINICAL DATA:  Empyema. EXAM: PORTABLE CHEST 1 VIEW COMPARISON:  Radiograph of Apr 27, 2018. FINDINGS: Stable position of right-sided chest tube. Continued diffuse opacification of right hemithorax is noted consistent with pneumonia or atelectasis and probable associated effusion. Some degree of volume loss appears to be present. Minimal left basilar subsegmental atelectasis is noted. Right internal jugular Port-A-Cath is unchanged in position. Stable subcutaneous emphysema seen over right lateral chest wall. Bony thorax is intact. IMPRESSION: Stable position of right-sided chest tube. Continued diffuse opacification right mid thorax is noted consistent with pneumonia or atelectasis and probable associated effusion. Electronically Signed   By: Marijo Conception, M.D.   On: 04/29/2018 08:59   Dg Chest Port 1 View  Result Date: 04/27/2018 CLINICAL DATA:  Follow-up empyema EXAM: PORTABLE CHEST 1 VIEW COMPARISON:  04/25/2018 FINDINGS: Cardiac shadow is stable. Left lung remains clear. Near complete opacification of the right hemithorax is again noted. Right chest tube is again seen and stable. Only minimal aeration of the right lung is noted. Right chest wall port is again seen. IMPRESSION: Overall stable appearance of the chest. Electronically Signed   By: Inez Catalina M.D.   On: 04/27/2018 09:31   Dg Chest Port 1  View  Result Date: 04/25/2018 CLINICAL DATA:  Chest tube, pain EXAM: PORTABLE CHEST 1 VIEW COMPARISON:  04/22/2018 FINDINGS: Near complete opacification the right hemithorax. Indwelling right chest tube. Left lung is essentially clear. The heart is normal in size. Right chest port terminates the cavoatrial junction. Subcutaneous emphysema along the right lateral chest wall, unchanged. IMPRESSION: Near complete opacification of the right hemithorax. Indwelling right chest tube with subcutaneous emphysema along the right lateral chest wall, unchanged. Electronically Signed   By: Julian Hy M.D.   On: 04/25/2018 09:24   Dg Chest Port 1 View  Result Date: 04/22/2018 CLINICAL DATA:  Empyema with chest tube in place. EXAM: PORTABLE CHEST 1 VIEW COMPARISON:  04/20/2018 FINDINGS: Patient is rotated to the right. Right IJ central venous catheter unchanged with tip over the right atrium. Right basilar chest tube in place with tip now pointing inferiorly over the medial right base. Persistent opacification over the right hemithorax with mild lung aeration unchanged. Left lung is clear. Stable moderate subcutaneous emphysema over the right flank. Remainder the exam is unchanged. IMPRESSION: Stable right lung opacification with minimal aeration compatible with known empyema with associated pleural drainage catheter as described. Moderate stable right flank subcutaneous emphysema. Right IJ central venous catheter unchanged with tip over the right atrium. Electronically Signed   By: Marin Olp M.D.   On: 04/22/2018 09:04   Dg Chest Port 1 View  Result Date:  04/20/2018 CLINICAL DATA:  Evaluate right chest tube EXAM: PORTABLE CHEST 1 VIEW COMPARISON:  Portable chest x-ray of 04/19/2018 FINDINGS: There is little change in almost complete opacification of the right hemithorax with some shift of the mediastinum to the right indicating volume loss. Right chest tube remains and no definite pneumothorax is seen  although there is a moderately large amount of right chest wall subcutaneous emphysema present. The left lung appears clear and somewhat hyperaerated. Heart size is difficult to assess. Right central venous line tip overlies expected SVC-RA junction. IMPRESSION: 1. Little change in almost complete opacification of the right hemithorax with probable volume loss on the right and mediastinal shift to the right. Right chest tube remains unchanged. 2. The left lung is clear and hyperaerated. 3. Considerable right chest wall subcutaneous emphysema. Electronically Signed   By: Ivar Drape M.D.   On: 04/20/2018 10:05   Dg Chest Port 1 View  Result Date: 04/19/2018 CLINICAL DATA:  Surgery follow-up. EXAM: PORTABLE CHEST 1 VIEW COMPARISON:  Plain film of 04/18/2018. FINDINGS: Status post surgical wound debridement and chest tube insertion since yesterday's exam. Chest tube is seen at the RIGHT lung base with tip directed towards the RIGHT hilum. The collection of air at the RIGHT lung base has been resolved. There is now near complete opacification the RIGHT hemithorax, with associated volume loss. LEFT lung remains clear. Persistent subcutaneous emphysema overlying the RIGHT chest, an expected finding. IMPRESSION: RIGHT-sided chest tube in place status post interval surgical wound debridement. Associated interval resolution of the collection of air at the RIGHT lung base. Electronically Signed   By: Franki Cabot M.D.   On: 04/19/2018 21:06   [place] Ekg Site Rite  Result Date: 04/22/2018 If Site Rite image not attached, placement could not be confirmed due to current cardiac rhythm.   Labs:  CBC: Recent Labs    04/18/18 1941 04/20/18 0928 04/23/18 0343 04/26/18 0503  WBC 10.2 9.9 7.5 7.2  HGB 9.8* 9.7* 8.9* 10.0*  HCT 31.5* 31.6* 29.6* 33.4*  PLT 274 308 235 240    COAGS: Recent Labs    08/23/17 1145 03/23/18 1922  03/26/18 2111 03/27/18 0620 03/27/18 1848 03/28/18 0611 04/18/18 1941  INR 1.22  1.60  --   --   --  1.19  --  1.37  APTT 38*  --    < > 93* 91* 49* 39*  --    < > = values in this interval not displayed.    BMP: Recent Labs    04/18/18 1941 04/20/18 0928 04/23/18 0343 04/26/18 0503  NA 136 138 141 146*  K 3.4* 3.7 3.5 3.9  CL 94* 99* 103 107  CO2 32 31 30 31   GLUCOSE 106* 206* 97 89  BUN 13 12 11 12   CALCIUM 8.2* 7.7* 7.8* 8.3*  CREATININE 1.37* 1.36* 1.53* 1.34*  GFRNONAA 57* 57* 50* 58*  GFRAA >60 >60 57* >60    LIVER FUNCTION TESTS: Recent Labs    03/23/18 1922 03/27/18 1848 03/30/18 0223 04/17/18 1549  BILITOT 1.2 0.4 0.7 0.4  AST 45* 49* 30 70*  ALT 35 28 22 36  ALKPHOS 220* 216* 156* 267*  PROT 7.8 6.6 6.3* 7.9  ALBUMIN 2.2* 1.7* 1.7* 2.0*    TUMOR MARKERS: No results for input(s): AFPTM, CEA, CA199, CHROMGRNA in the last 8760 hours.  Assessment and Plan:  Stage IV lung cancer.   Will proceed with lymph node biopsy today by Dr. Kathlene Cote.  Risks and benefits  discussed with the patient including, but not limited to bleeding, infection, damage to adjacent structures or low yield requiring additional tests.  All of the patient's questions were answered, patient is agreeable to proceed. Consent signed and in chart.  Thank you for this interesting consult.  I greatly enjoyed meeting Erik [name] and look forward to participating in their care.  A copy of this report was sent to the requesting provider on this date.  Electronically Signed: Murrell Redden, PA-C   05/15/2018, 12:25 PM      I spent a total of  30 Minutes in face to face in clinical consultation, greater than 50% of which was counseling/coordinating care for lymph node biopsy.

## 2018-05-16 ENCOUNTER — Encounter: Payer: Self-pay | Admitting: Thoracic Surgery (Cardiothoracic Vascular Surgery)

## 2018-05-17 ENCOUNTER — Other Ambulatory Visit: Payer: Self-pay | Admitting: Thoracic Surgery (Cardiothoracic Vascular Surgery)

## 2018-05-17 DIAGNOSIS — C3491 Malignant neoplasm of unspecified part of right bronchus or lung: Secondary | ICD-10-CM

## 2018-05-18 ENCOUNTER — Encounter: Payer: Self-pay | Admitting: Thoracic Surgery (Cardiothoracic Vascular Surgery)

## 2018-05-18 ENCOUNTER — Ambulatory Visit
Admission: RE | Admit: 2018-05-18 | Discharge: 2018-05-18 | Disposition: A | Discharge status: Home / Self Care | Payer: Medicare Other | Source: Ambulatory Visit | Attending: Thoracic Surgery (Cardiothoracic Vascular Surgery) | Admitting: Thoracic Surgery (Cardiothoracic Vascular Surgery)

## 2018-05-18 ENCOUNTER — Ambulatory Visit (INDEPENDENT_AMBULATORY_CARE_PROVIDER_SITE_OTHER): Payer: Self-pay | Admitting: Thoracic Surgery (Cardiothoracic Vascular Surgery)

## 2018-05-18 ENCOUNTER — Other Ambulatory Visit: Payer: Self-pay

## 2018-05-18 VITALS — BP 152/94 | HR 117 | Temp 100.2°F | Resp 18 | Ht 72.0 in | Wt 206.0 lb

## 2018-05-18 DIAGNOSIS — C3491 Malignant neoplasm of unspecified part of right bronchus or lung: Secondary | ICD-10-CM

## 2018-05-18 DIAGNOSIS — J869 Pyothorax without fistula: Secondary | ICD-10-CM

## 2018-05-18 NOTE — Progress Notes (Signed)
[address]       [place] [address]             [phone]    HPI: Erik [name] returns for scheduled follow-up visit.  Erik [name] is a 57 year old man with metastatic lung cancer.  He was initially diagnosed with stage IIIa disease in 2017 but has progressed to stage IV disease.  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  I did right VATS and decortication on 03/28/2018.  He had an air leak initially but that resolved.  He was discharged on 04/08/2018.  He presented back 2 weeks later with fever and tachycardia.  He had an air-fluid level in the chest wall as well as in the right pleural space.  I took him back to the OR and placed chest drains and opened his chest wound and placed a VAC.  Cultures grew Vanco resistant enterococcus.  He is treated with linezolid for that.  Last week I saw him in the office and he complained of swelling in his left neck.  He otherwise was doing well.  We did a needle biopsy of that which showed metastatic lung cancer.  Today he is not feeling as well.  He said he had a low-grade temperature this morning.  He has not had any cough, shortness of breath, sputum production, dysuria.  He is still on linezolid.  Past Medical History:  Diagnosis Date  . Arnold-Chiari syndrome ([place])   . Arthritis   . Asthma   . Chronic back pain   . COPD (chronic obstructive pulmonary disease) ([place])   . Depression   . Dyspnea    with exertion   . GERD (gastroesophageal reflux disease)   . Hypertension   . Pneumonia   . Pulmonary embolus ([place])   . Seasonal allergies   . Spinal stenosis of lumbar region   . 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 Wheezing     Current Outpatient Medications  Medication Sig Dispense Refill  . acetaminophen (TYLENOL) 325 MG tablet Take 2 tablets (650 mg total) by mouth every 6 (six) hours as needed for mild pain (or Fever >/= 101).    [name] albuterol (PROVENTIL) (2.5 MG/3ML)  0.083% nebulizer solution Take 3 mLs (2.5 mg total) by nebulization every 6 (six) hours as needed for wheezing or shortness of breath (if unable to experience relief by using combivent inhaler). 75 mL 4  . ALPRAZolam (XANAX) 1 MG tablet Take 1 mg by mouth 2 (two) times daily as needed for anxiety or sleep (takes 1 tablet and bedtime for sleep and 1 dose during the day only if needed).     [name] apixaban (ELIQUIS) 5 MG TABS tablet Take 1 tablet (5 mg total) by mouth 2 (two) times daily. 60 tablet 2  . Artificial Tear Solution (SOOTHE XP) SOLN Apply 2 drops to eye daily.    [name] gabapentin (NEURONTIN) 600 MG tablet Take 600 mg by mouth 2 (two) times daily.     [name] guaiFENesin (MUCINEX) 600 MG 12 hr tablet Take 600 mg by mouth 2 (two) times daily as needed (for congestion.).    [name] Ipratropium-Albuterol (COMBIVENT) 20-100 MCG/ACT AERS respimat Inhale 1 puff into the lungs every 6 (six) hours as needed for wheezing or shortness of breath. 1 Inhaler 3  . lactulose (CHRONULAC) 10 GM/15ML solution Take 15 mLs (10 g total) by mouth 3 (three) times daily as needed for  mild constipation or moderate constipation. 240 mL 0  . levalbuterol (XOPENEX) 0.63 MG/3ML nebulizer solution Take 3 mLs (0.63 mg total) by nebulization every 8 (eight) hours as needed for wheezing or shortness of breath. 3 mL 12  . linezolid (ZYVOX) 600 MG tablet Take 1 tablet (600 mg total) by mouth 2 (two) times daily. 68 tablet 0  . loperamide (IMODIUM) 2 MG capsule Take 1 capsule (2 mg total) by mouth as needed for diarrhea or loose stools. 20 capsule 0  . methadone (DOLOPHINE) 10 MG tablet Take 20 mg by mouth 5 (five) times daily. Takes 2 tablets 5 times daily    . mirtazapine (REMERON) 15 MG tablet Take 1 tablet by mouth at bedtime.    . Misc. Devices MISC POC at 2 lpm via [place] with conserver / pulse ox on exertion. 1 each 0  . Multiple Vitamin (MULTIVITAMIN WITH MINERALS) TABS tablet Take 1 tablet by mouth daily. Centrum    . ondansetron (ZOFRAN) 8 MG  tablet TAKE 1 TABLET BY MOUTH EVERY 8 HOURS AS NEEDED FOR NAUSEA AND VOMITING. 60 tablet 1  . OXYGEN Inhale 3 L into the lungs.    . pantoprazole (PROTONIX) 40 MG tablet TAKE (1) TABLET BY MOUTH TWICE DAILY. 60 tablet 0  . potassium chloride SA (K-DUR,KLOR-CON) 20 MEQ tablet Take 1 tablet (20 mEq total) by mouth daily. 60 tablet 1  . tiZANidine (ZANAFLEX) 4 MG tablet Take 4 mg by mouth every 8 (eight) hours as needed for muscle spasms.     . varenicline (CHANTIX) 0.5 MG tablet TAKE (2) TABLETS BY MOUTH TWICE DAILY. 112 tablet 0  . amLODipine (NORVASC) 5 MG tablet     . escitalopram (LEXAPRO) 20 MG tablet      No current facility-administered medications for this visit.    Facility-Administered Medications Ordered in Other Visits  Medication Dose Route Frequency Provider Last Rate Last Dose  . 0.9 %  sodium chloride infusion   Intravenous Continuous Baird Cancer, PA-C 10 mL/hr at 01/13/17 1330      Physical Exam BP (!) 152/94 (BP Location: Left Arm, Patient Position: Sitting, Cuff Size: Normal)   Pulse (!) 117   Temp 100.2 F (37.9 C) (Oral)   Resp 18   Ht 6' (1.829 m)   Wt 206 lb (93.4 kg)   SpO2 97% Comment: 2L Agency  BMI 27.72 kg/m  57 year old man in no acute distress Does not appear toxic Alert and oriented x3 with no focal deficits Left supra clavicular mass unchanged Lungs minimal breath sounds on right Chest tube site with minimal irritation Chest wound granulating over 100% measures 5 x 2 x 1 cm Skin irritation around the incision.  Diagnostic Tests: CHEST - 2 VIEW  COMPARISON:  May 09, 2018  FINDINGS: Chest tube position on the right is stable. Port-A-Cath tip is at the cavoatrial junction. No pneumothorax. There is scarring with loculated pleural effusion and consolidation on the right. Most of the right lung is opacified. Volume loss is noted on the right.  Left lung is hyperexpanded but clear. The heart size is normal. Pulmonary vascularity on the  right is distorted. Pulmonary vascular on the left appears normal. No adenopathy is apparent by radiography. There is degenerative change in the thoracic spine.  IMPRESSION: Extensive opacification on the right with volume loss, likely due to a combination of scarring, consolidation, and loculated effusion. An underlying mass on the right cannot be excluded.  Tube and catheter positions are unchanged without  pneumothorax.  Left lung is clear.  Stable cardiac silhouette.   Electronically Signed   By: Lowella Grip III M.D. I personally reviewed the chest x-ray concur with the findings noted above  Impression: Erik [name] is a 57 year old gentleman with stage IV lung cancer who had right pneumonia and empyema in the setting of a chronic pleural effusion.  Decortication was unsuccessful.  He developed a wound infection requiring VAC placement as well as a pleural space infection.  Cultures grew out Vanco resistant enterococcus.  He is on linezolid for that.  His chest wound continues to do beautifully with the VAC.  He is got some irritation of the skin around the site, but the wound is 100% granulating and is shrinking rapidly.  I do think we need to continue the [facility] for that for now.  His previously open chest tube site which had been included in the [facility] is now just being treated with saline wet-to-dry dressing changes and that is doing well.  His chest tube is draining less than 100 mL a day of serous fluid  I am concerned about his low-grade fever.  He does not want to be readmitted to the hospital.  He promised to call if he has any fevers above 101 or starts feeling worse in any other way.  Plan: Return in 1 week  [name], MD [facility] [phone]

## 2018-05-22 ENCOUNTER — Telehealth: Payer: Self-pay

## 2018-05-22 NOTE — Telephone Encounter (Signed)
Erik [name] is concerned that his Express chest tube system is clogged at the drainage port and he is unable to drain the chest tube.  He stated that he is able to put fluid into the port, but is unable to draw any fluid out.  He said that it is halfway full and did not want to take a trip to the office to have the system switched out.  I advised his that since he has a follow-up appointment with Dr. Roxan Hockey on Wednesday, that he could wait to have it switched then.  He acknowledged receipt.  I also advised him to contact the office if he has increased drainage and the atrium if full.  He acknowledged receipt.

## 2018-05-22 NOTE — Telephone Encounter (Signed)
Epi with [facility] wound management called to get Erik [name] measurements from the wound with the wound vac placed.  Wound measurements were given from office visit on 05/18/2018.  Asked if patient still had the wound vac, which the response was, yes.  And there have been no "breaks" in usage.  All questions were answered.

## 2018-05-23 ENCOUNTER — Telehealth: Payer: Self-pay

## 2018-05-23 NOTE — Telephone Encounter (Signed)
Linus Orn, Social Worker from [employer], [phone] called in regards to Mr. Kirsh and having a face-to-face consultation for community resources.  She needed a VO to see him one time.  Verbal order given over the phone via voicemail.  Return number given with any other questions.

## 2018-05-24 ENCOUNTER — Encounter: Payer: Self-pay | Admitting: Thoracic Surgery (Cardiothoracic Vascular Surgery)

## 2018-05-25 ENCOUNTER — Ambulatory Visit (INDEPENDENT_AMBULATORY_CARE_PROVIDER_SITE_OTHER): Payer: Self-pay | Admitting: Thoracic Surgery (Cardiothoracic Vascular Surgery)

## 2018-05-25 VITALS — BP 117/76 | HR 96 | Temp 97.9°F | Resp 20 | Ht 72.0 in | Wt 204.0 lb

## 2018-05-25 DIAGNOSIS — Z85118 Personal history of other malignant neoplasm of bronchus and lung: Secondary | ICD-10-CM

## 2018-05-25 DIAGNOSIS — J869 Pyothorax without fistula: Secondary | ICD-10-CM

## 2018-05-25 DIAGNOSIS — Z09 Encounter for follow-up examination after completed treatment for conditions other than malignant neoplasm: Secondary | ICD-10-CM

## 2018-05-25 DIAGNOSIS — L089 Local infection of the skin and subcutaneous tissue, unspecified: Secondary | ICD-10-CM

## 2018-05-25 DIAGNOSIS — T148XXA Other injury of unspecified body region, initial encounter: Secondary | ICD-10-CM

## 2018-05-25 NOTE — Progress Notes (Signed)
[address]       [place] [address]             [phone]       HPI: Mr. Ganas returns today for a scheduled follow-up visit  Evaan Tidwell is a 57 year old man with metastatic lung cancer.  He had a bronchopleural fistula and empyema in April.  I did right VATS and decortication on 03/28/2018.  Unfortunately he did not have full expansion of his lung.  He had an air leak initially but that resolved he was discharged on 04/08/2018.  He came back two weeks later with fever and tachycardia.  Had air-fluid levels in the right chest wall and right pleural space.  Went back to the OR and did a I&D of the chest wound and placed a VAC in place to new chest strain.  Cultures grew Vanco resistant enterococcus.  He has been treated with linezolid for that.  Saw him in the office last week he was complaining of feeling poorly with a low-grade temperature.  He refused hospital readmission at that time.  Over the weekend his chest tube became clogged and he has not had any output since Saturday.  He has not had any further fevers and feels well.  He has not had any change in his respiratory status or pain associated with decrease in the chest tube drainage.  Past Medical History:  Diagnosis Date  . 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 Arthritis   . Asthma   . Chronic back pain   . COPD (chronic obstructive pulmonary disease) ([place])   . Depression   . Dyspnea    with exertion   . GERD (gastroesophageal reflux disease)   . Hypertension   . Pneumonia   . Pulmonary embolus ([place])   . Seasonal allergies   . Spinal stenosis of lumbar region   . 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 Wheezing      Current Outpatient Medications  Medication Sig Dispense Refill  . acetaminophen (TYLENOL) 325 MG tablet Take 2 tablets (650 mg total) by mouth every 6 (six) hours as needed for mild pain (or Fever >/= 101).    [name] albuterol (PROVENTIL) (2.5 MG/3ML) 0.083% nebulizer  solution Take 3 mLs (2.5 mg total) by nebulization every 6 (six) hours as needed for wheezing or shortness of breath (if unable to experience relief by using combivent inhaler). 75 mL 4  . ALPRAZolam (XANAX) 1 MG tablet Take 1 mg by mouth 2 (two) times daily as needed for anxiety or sleep (takes 1 tablet and bedtime for sleep and 1 dose during the day only if needed).     [name] amLODipine (NORVASC) 5 MG tablet     . apixaban (ELIQUIS) 5 MG TABS tablet Take 1 tablet (5 mg total) by mouth 2 (two) times daily. 60 tablet 2  . Artificial Tear Solution (SOOTHE XP) SOLN Apply 2 drops to eye daily.    [name] escitalopram (LEXAPRO) 20 MG tablet     . gabapentin (NEURONTIN) 600 MG tablet Take 600 mg by mouth 2 (two) times daily.     [name] guaiFENesin (MUCINEX) 600 MG 12 hr tablet Take 600 mg by mouth 2 (two) times daily as needed (for congestion.).    [name] Ipratropium-Albuterol (COMBIVENT) 20-100 MCG/ACT AERS respimat Inhale 1 puff into the lungs every 6 (six) hours as needed for wheezing or shortness of breath. 1 Inhaler 3  . lactulose (CHRONULAC) 10 GM/15ML  solution Take 15 mLs (10 g total) by mouth 3 (three) times daily as needed for mild constipation or moderate constipation. 240 mL 0  . levalbuterol (XOPENEX) 0.63 MG/3ML nebulizer solution Take 3 mLs (0.63 mg total) by nebulization every 8 (eight) hours as needed for wheezing or shortness of breath. 3 mL 12  . linezolid (ZYVOX) 600 MG tablet Take 1 tablet (600 mg total) by mouth 2 (two) times daily. 68 tablet 0  . loperamide (IMODIUM) 2 MG capsule Take 1 capsule (2 mg total) by mouth as needed for diarrhea or loose stools. 20 capsule 0  . methadone (DOLOPHINE) 10 MG tablet Take 20 mg by mouth 5 (five) times daily. Takes 2 tablets 5 times daily    . mirtazapine (REMERON) 15 MG tablet Take 1 tablet by mouth at bedtime.    . Misc. Devices MISC POC at 2 lpm via [place] with conserver / pulse ox on exertion. 1 each 0  . Multiple Vitamin (MULTIVITAMIN WITH MINERALS) TABS tablet  Take 1 tablet by mouth daily. Centrum    . ondansetron (ZOFRAN) 8 MG tablet TAKE 1 TABLET BY MOUTH EVERY 8 HOURS AS NEEDED FOR NAUSEA AND VOMITING. 60 tablet 1  . OXYGEN Inhale 3 L into the lungs.    . pantoprazole (PROTONIX) 40 MG tablet TAKE (1) TABLET BY MOUTH TWICE DAILY. 60 tablet 0  . potassium chloride SA (K-DUR,KLOR-CON) 20 MEQ tablet Take 1 tablet (20 mEq total) by mouth daily. 60 tablet 1  . tiZANidine (ZANAFLEX) 4 MG tablet Take 4 mg by mouth every 8 (eight) hours as needed for muscle spasms.     . varenicline (CHANTIX) 0.5 MG tablet TAKE (2) TABLETS BY MOUTH TWICE DAILY. 112 tablet 0   No current facility-administered medications for this visit.    Facility-Administered Medications Ordered in Other Visits  Medication Dose Route Frequency Provider Last Rate Last Dose  . 0.9 %  sodium chloride infusion   Intravenous Continuous Baird Cancer, PA-C 10 mL/hr at 01/13/17 1330      Physical Exam BP 117/76   Pulse 96   Temp 97.9 F (36.6 C) (Oral)   Resp 20   Ht 6' (1.829 m)   Wt 204 lb (92.5 kg)   SpO2 99% Comment: RA  BMI 27.73 kg/m  57 year old man in no acute distress Appears chronically ill Lungs markedly diminished on right with some air movement in the right upper lung fields Chest tube in place with some local irritation around the site Wound 5 cm long 2 cm wide 1 cm deep, granulating  Impression: Mr. Vandenberghe is a 57 year old man with metastatic lung cancer who had pneumonia complicated by bronchopleural fistula and empyema.  He developed a wound infection and pleural space infection.  He has had the wound infection treated with a VAC.  That is doing extremely well and continues to rapidly shrink.  He is almost to the point that we go to saline wet-to-dry dressing changes but want to keep the [facility] on for another week.  He has not had any chest tube output in almost a week.  He has not had any fevers or change in respiratory status with the tube being nonfunctional.  I  think at this point we can go ahead and remove the chest tube.  Chest tube removed without difficulty.  Patient tolerated well.  Plan: Return in 1 week for wound check.  We will get a chest x-ray at that time  [name], MD [facility] [phone]

## 2018-05-30 ENCOUNTER — Inpatient Hospital Stay (HOSPITAL_COMMUNITY): Payer: Medicare Other

## 2018-05-30 ENCOUNTER — Encounter (HOSPITAL_COMMUNITY): Payer: Self-pay | Admitting: Hematology

## 2018-05-30 ENCOUNTER — Inpatient Hospital Stay (HOSPITAL_COMMUNITY): Payer: Medicare Other | Attending: Hematology and Oncology | Admitting: Hematology

## 2018-05-30 ENCOUNTER — Other Ambulatory Visit: Payer: Self-pay

## 2018-05-30 ENCOUNTER — Other Ambulatory Visit: Payer: Self-pay | Admitting: Thoracic Surgery (Cardiothoracic Vascular Surgery)

## 2018-05-30 VITALS — BP 127/81 | HR 109 | Temp 98.8°F | Resp 18 | Wt 223.0 lb

## 2018-05-30 DIAGNOSIS — Z9221 Personal history of antineoplastic chemotherapy: Secondary | ICD-10-CM | POA: Diagnosis not present

## 2018-05-30 DIAGNOSIS — Z923 Personal history of irradiation: Secondary | ICD-10-CM | POA: Insufficient documentation

## 2018-05-30 DIAGNOSIS — D649 Anemia, unspecified: Secondary | ICD-10-CM

## 2018-05-30 DIAGNOSIS — M7989 Other specified soft tissue disorders: Secondary | ICD-10-CM | POA: Diagnosis not present

## 2018-05-30 DIAGNOSIS — C770 Secondary and unspecified malignant neoplasm of lymph nodes of head, face and neck: Secondary | ICD-10-CM | POA: Insufficient documentation

## 2018-05-30 DIAGNOSIS — I2699 Other pulmonary embolism without acute cor pulmonale: Secondary | ICD-10-CM | POA: Insufficient documentation

## 2018-05-30 DIAGNOSIS — C3431 Malignant neoplasm of lower lobe, right bronchus or lung: Secondary | ICD-10-CM | POA: Diagnosis present

## 2018-05-30 DIAGNOSIS — Z7901 Long term (current) use of anticoagulants: Secondary | ICD-10-CM | POA: Diagnosis not present

## 2018-05-30 DIAGNOSIS — C3491 Malignant neoplasm of unspecified part of right bronchus or lung: Secondary | ICD-10-CM

## 2018-05-30 DIAGNOSIS — K7460 Unspecified cirrhosis of liver: Secondary | ICD-10-CM | POA: Insufficient documentation

## 2018-05-30 LAB — CBC WITH DIFFERENTIAL/PLATELET
BASOS ABS: 0 10*3/uL (ref 0.0–0.1)
BASOS PCT: 0 %
EOS ABS: 0.1 10*3/uL (ref 0.0–0.7)
EOS PCT: 1 %
HCT: 33.8 % — ABNORMAL LOW (ref 39.0–52.0)
Hemoglobin: 10.6 g/dL — ABNORMAL LOW (ref 13.0–17.0)
Lymphocytes Relative: 13 %
Lymphs Abs: 1.2 10*3/uL (ref 0.7–4.0)
MCH: 30.2 pg (ref 26.0–34.0)
MCHC: 31.4 g/dL (ref 30.0–36.0)
MCV: 96.3 fL (ref 78.0–100.0)
MONO ABS: 0.7 10*3/uL (ref 0.1–1.0)
Monocytes Relative: 8 %
Neutro Abs: 7.1 10*3/uL (ref 1.7–7.7)
Neutrophils Relative %: 78 %
PLATELETS: 245 10*3/uL (ref 150–400)
RBC: 3.51 MIL/uL — AB (ref 4.22–5.81)
RDW: 19.1 % — ABNORMAL HIGH (ref 11.5–15.5)
WBC: 9.2 10*3/uL (ref 4.0–10.5)

## 2018-05-30 LAB — COMPREHENSIVE METABOLIC PANEL
ALK PHOS: 249 U/L — AB (ref 38–126)
ALT: 30 U/L (ref 0–44)
AST: 48 U/L — AB (ref 15–41)
Albumin: 2.4 g/dL — ABNORMAL LOW (ref 3.5–5.0)
Anion gap: 8 (ref 5–15)
BILIRUBIN TOTAL: 0.5 mg/dL (ref 0.3–1.2)
BUN: 6 mg/dL (ref 6–20)
CALCIUM: 8 mg/dL — AB (ref 8.9–10.3)
CO2: 25 mmol/L (ref 22–32)
CREATININE: 0.86 mg/dL (ref 0.61–1.24)
Chloride: 104 mmol/L (ref 98–111)
GFR calc Af Amer: >60 mL/min (ref >60)
GLUCOSE: 141 mg/dL — AB (ref 70–99)
POTASSIUM: 3.7 mmol/L (ref 3.5–5.1)
Sodium: 137 mmol/L (ref 135–145)
TOTAL PROTEIN: 7.7 g/dL (ref 6.5–8.1)

## 2018-05-30 LAB — IRON AND TIBC
IRON: 24 ug/dL — AB (ref 45–182)
Saturation Ratios: 11 % — ABNORMAL LOW (ref 17.9–39.5)
TIBC: 225 ug/dL — ABNORMAL LOW (ref 250–450)
UIBC: 201 ug/dL

## 2018-05-30 LAB — LACTATE DEHYDROGENASE: LDH: 136 U/L (ref 98–192)

## 2018-05-30 LAB — FOLATE: FOLATE: 18.8 ng/mL (ref >5.9)

## 2018-05-30 LAB — RETICULOCYTES
RBC.: 3.51 MIL/uL — ABNORMAL LOW (ref 4.22–5.81)
RETIC COUNT ABSOLUTE: 91.3 10*3/uL (ref 19.0–186.0)
RETIC CT PCT: 2.6 % (ref 0.4–3.1)

## 2018-05-30 LAB — FERRITIN: Ferritin: 81 ng/mL (ref 24–336)

## 2018-05-30 LAB — VITAMIN B12: Vitamin B-12: 460 pg/mL (ref 180–914)

## 2018-05-30 NOTE — Assessment & Plan Note (Signed)
1.  Stage IV adenocarcinoma of the right lung: - Initially treated with cisplatin and VP-16 for stage III disease followed by Imfinzi from 12/02/2016 through 06/30/2017. -Tecentriq every 3 weeks from 08/04/2017 through 01/02/2018. - He developed empyema of the right pleural space from a bronchopleural fistula, status post VATS decortication on 0/98/1191, complicated by wound infection with VRE and VAC placement.  Chest tube was removed last week.  He has a follow-up with Dr. Roxan Hockey tomorrow for possible removal of VAC. - Left supraclavicular adenopathy was noted in May 2019.  Underwent needle biopsy on 05/15/2018 which shows metastatic poorly differentiated adenocarcinoma consistent with his lung primary. -His energy levels are slowly coming back to normal.  I have recommended restaging scans with CT scan of the chest, abdomen and pelvis.  His brain MRI was done in April 2019 which was negative for metastatic disease.  I have also recommended foundation 1 CDX testing to see if he has any targetable mutations.  We will see him back in 2 weeks for follow-up and discuss results and further plan.  2.  Lower extremity swelling: This has improved slightly.  This is from underlying cirrhosis.  3.  Pulmonary embolism: He is currently on Eliquis which he is tolerating reasonably well.  He has occasional blood-tinged sputum when he coughs.  4.  Normocytic anemia: We will do further work-up including ferritin, iron panel, Y78, folic acid and SPEP today.

## 2018-05-30 NOTE — Progress Notes (Signed)
[place], [place] [address]   CLINIC:  Medical Oncology/Hematology  PCP:  [name], [place] [address] [phone]   REASON FOR VISIT:  Follow-up for non-small cell lung cancer.  CURRENT THERAPY: Observation.  BRIEF ONCOLOGIC HISTORY:    Adenocarcinoma of right lung ([place])   07/01/2016 PET scan    6.3 x 3.7 cm right lower lobe mass, suspicious for primary bronchogenic neoplasm, as described above. Hypermetabolic thoracic nodal metastases, as above. Additional right perihilar hypermetabolism, indeterminate. Associated right middle lobe atelectasis/collapse.      07/19/2016 Procedure    Bronchoscopy with brushings and biopsies and endobronchial ultrasound with mediastinal lymph node aspirations by Dr. Roxan Hockey      07/21/2016 Pathology Results    Lung, biopsy, Right Middle Lobe - LUNG TISSUE WITH SQUAMOUS METAPLASIA. - NO MALIGNANCY IDENTIFIED.      07/21/2016 Pathology Results    FINE NEEDLE ASPIRATION, ENDOSCOPIC (A) LEVEL 7 (SPECIMEN 1 OF 3, COLLECTED ON 07/19/16): MALIGNANT CELLS CONSISTENT WITH ADENOCARCINOMA.      07/21/2016 Pathology Results    FINE NEEDLE ASPIRATION, EBUS, 4R, B (SPECIMEN 2 OF 3, COLLECTED 07/19/16): MALIGNANT CELLS CONSISTENT WITH ADENOCARCINOMA.      07/21/2016 Pathology Results    FINE NEEDLE ASPIRATION, EBUS, BRUSHING, RIGHT MIDDLE LOBE, D (SPECIMEN 3 OF 3, COLLECTED 07/19/16): MALIGNANT CELLS CONSISTENT WITH ADENOCARCINOMA.      07/22/2016 Imaging    MRI brain- No evidence of intracranial metastases.      08/17/2016 - 09/20/2016 Chemotherapy    The patient had palonosetron (ALOXI) injection 0.25 mg, 0.25 mg, Intravenous,  Once, 1 of 1 cycle  CISplatin (PLATINOL) 123 mg in sodium chloride 0.9 % 500 mL chemo infusion, 50 mg/m2 = 123 mg, Intravenous,  Once, 1 of 1 cycle  etoposide (VEPESID) 120 mg in sodium chloride 0.9 % 500 mL chemo infusion, 50 mg/m2 = 120 mg,  Intravenous,  Once, 1 of 1 cycle  fosaprepitant (EMEND) 150 mg, dexamethasone (DECADRON) 12 mg in sodium chloride 0.9 % 145 mL IVPB, , Intravenous,  Once, 1 of 1 cycle  ondansetron (ZOFRAN) 8 mg in sodium chloride 0.9 % 50 mL IVPB, , Intravenous,  Once, 2 of 6 cycles  for chemotherapy treatment.        08/17/2016 -  Radiation Therapy         11/15/2016 Imaging    CT CAP- Interval response to therapy. The previously demonstrated mediastinal and right hilar adenopathy and resulting right middle lobe atelectasis have all improved. 2. No discrete residual lung masses are identified. There is new multifocal ground-glass opacity within the right lower lobe, and to a lesser extent in the left upper lobe. These are probably inflammatory/treatment related. 3. No evidence of abdominopelvic metastatic disease. Stable prominent lymph nodes in the upper abdomen, likely reactive. 4. Decompressed mid SVC without specific signs of SVC occlusion.      12/02/2016 -  Chemotherapy    Imfinzi (durvalumab) immunotherapy every 2 weeks x up to 1 year      01/03/2017 Procedure    EGD by Dr. [name], colonoscopy aborted as "patient forgot to take other half of preparation). Esophagitis. likely radiation-induced ?Dilated.  Erythematous mucosa in the stomach. Biopsied. Normal duodenal bulb and second portion of the duodenum.      02/14/2017 Imaging    CT chest- 1. Development of a small to moderate right-sided pleural effusion with minimal loculation anteriorly. 2. Worsened right-sided aeration with right  middle and lower lobe consolidation. As this has a geographic distribution, this could be radiation induced or represent infection. Depending on clinical concern of progressive disease, thoracentesis and/or PET may be informative. 3. Development of mild right paratracheal adenopathy, most likely reactive. Recommend attention on follow-up. 4. Subtle findings which are highly suspicious for mild  cirrhosis. Upper abdominal adenopathy is similar and likely reactive. 5. Persistent right lower and improved left upper lobe ground-glass opacities are favored to be infectious or inflammatory.      06/01/2017 Imaging    CT chest  IMPRESSION: 1. Evolving radiation changes in the medial right hemithorax. 2. Stable moderate size right pleural effusion without definite nodular components. 3. No signs of local recurrence or progressive metastatic disease. No residual enlarged thoracic lymph nodes. 4. Stable appearance of the visualized upper abdomen with probable cirrhosis and reactive adenopathy in the porta hepatis.      07/15/2017 Procedure    Therapeutic thoracentesis performed today; 1.3 L removed.  Fluid sent for cytology.       07/15/2017 Pathology Results    Pleural fluid NEGATIVE for malignancy by cytology.       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 Paramediastinal radiation change and loculated right pleural effusion appear unchanged from 07/14/2017. 2. Borderline prevascular lymph node within the anterior mediastinum is mildly increased in size from previous exam. 3. There is a new 5 mm lung nodule within the left lower lobe. Nonspecific in appearance. Attention on follow-up imaging advise. Other small nonspecific nodules in the left lung are stable. 4. Morphologic features of the liver compatible with cirrhosis. Enlarged upper abdominal lymph nodes are nonspecific and likely reactive. 5. Stable indeterminate low-attenuation focus in the posterior right liver dome. Previously characterized on MRI from 07/29/2017 as reflecting post treatment changes from external beam radiation to the lung. 6.  Emphysema (ICD10-J43.9).        CANCER STAGING: Cancer Staging Adenocarcinoma of right lung [facility]) Staging form: Lung, AJCC 7th Edition - Clinical stage from 07/23/2016:  Stage IIIA (T2b, N2, M0) - Signed by Baird Cancer, PA-C on 07/23/2016 - Clinical stage from 07/28/2017: Stage IV (M1a) - Signed by Holley Bouche, NP on 07/28/2017    INTERVAL HISTORY:  Erik [name] 57 y.o. male returns for follow-up of his metastatic non-small cell lung cancer of adenocarcinoma type.  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  He had a wound infection with VRE and a VAC placement.  He is slowly recuperating from this condition.  He is not doing much at home.  He gets up to go to the bathroom back and forth.  He is continuing to wear oxygen 24/7.  Continues to have fatigue.  Numbness in the feet and legs is stable.  No fevers in the last 2 weeks.    REVIEW OF SYSTEMS:  Review of Systems  Constitutional: Positive for fatigue.  HENT:   Positive for trouble swallowing.   Gastrointestinal: Positive for nausea.  Neurological: Positive for numbness.  Hematological: Bruises/bleeds easily.  All other systems reviewed and are negative.    PAST MEDICAL/SURGICAL HISTORY:  Past Medical History:  Diagnosis Date  . Arnold-Chiari syndrome ([place])   . Arthritis   . Asthma   . Chronic back pain   . COPD (chronic obstructive pulmonary disease) ([place])   .  Depression   . Dyspnea    with exertion   . GERD (gastroesophageal reflux disease)   . Hypertension   . Pneumonia   . Pulmonary embolus ([place])   . Seasonal allergies   . Spinal stenosis of lumbar region   . 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 Wheezing    Past Surgical History:  Procedure Laterality Date  . APPLICATION OF WOUND VAC  04/19/2018   Procedure: APPLICATION OF WOUND VAC;  Surgeon: [name], MD;  Location: [place];  Service: Vascular;;  . APPLICATION OF WOUND VAC Right 04/21/2018   Procedure: WOUND VAC CHANGE;  Surgeon: [name], MD;  Location: [place];  Service: Thoracic;  Laterality: Right;  .  APPLICATION OF WOUND VAC N/A 04/24/2018   Procedure: WOUND VAC CHANGE;  Surgeon: [name], MD;  Location: [place];  Service: Thoracic;  Laterality: N/A;  . BACK SURGERY     5 total  . BIOPSY  01/03/2017   Procedure: BIOPSY;  Surgeon: Daneil Dolin, MD;  [facility];  Service: Endoscopy;;  gastric  . CHEST TUBE INSERTION Right 04/19/2018   Procedure: CHEST TUBE INSERTION;  Surgeon: [name], MD;  Location: [place] [facility];  Service: Vascular;  Laterality: Right;  . CHEST TUBE INSERTION Right 04/19/2018   Procedure: CHEST TUBE INSERTION;  Surgeon: [name], MD;  Location: [place];  Service: Thoracic;  Laterality: Right;  . COLONOSCOPY WITH PROPOFOL N/A 04/04/2017   Procedure: COLONOSCOPY WITH PROPOFOL;  Surgeon: Daneil Dolin, MD;  [facility];  Service: Endoscopy;  Laterality: N/A;  8:45am  . DECORTICATION Right 03/28/2018   Procedure: DECORTICATION;  Surgeon: [name], MD;  Location: [facility];  Service: Thoracic;  Laterality: Right;  . ESOPHAGOGASTRODUODENOSCOPY (EGD) WITH PROPOFOL N/A 01/03/2017   Procedure: ESOPHAGOGASTRODUODENOSCOPY (EGD) WITH PROPOFOL;  Surgeon: Daneil Dolin, MD;  [facility];  Service: Endoscopy;  Laterality: N/A;  . I&D EXTREMITY Right 04/19/2018   Procedure: IRRIGATION AND DEBRIDEMENT RIGHT CHEST WOUND;  Surgeon: [name], MD;  Location: [place];  Service: Vascular;  Laterality: Right;  . MALONEY DILATION N/A 01/03/2017   Procedure: Venia Minks DILATION;  Surgeon: Daneil Dolin, MD;  [facility];  Service: Endoscopy;  Laterality: N/A;  . MULTIPLE EXTRACTIONS WITH ALVEOLOPLASTY N/A 07/08/2014   Procedure: MULTIPLE EXTRACION WITH ALVEOLOPLASTY with EXCISION LESION RIGHT SIDE OF TONGUE;  Surgeon: Gae Bon, DDS;  Location: [place] [facility];  Service: Oral Surgery;  Laterality: N/A;  . PORTACATH PLACEMENT Right 07/30/2016   Procedure: INSERTION PORT-A-CATH;  Surgeon: Vickie Epley, MD;   [facility];  Service: Vascular;  Laterality: Right;  . THORACOTOMY  04/19/2018   Procedure: THORACOTOMY MAJOR;  Surgeon: [name], MD;  Location: [place];  Service: Vascular;;  . VIDEO ASSISTED THORACOSCOPY Right 04/21/2018   Procedure: VIDEO ASSISTED THORACOSCOPY;  Surgeon: [name], MD;  Location: [place];  Service: Thoracic;  Laterality: Right;  [name] VIDEO ASSISTED THORACOSCOPY Right 04/24/2018   Procedure: VIDEO ASSISTED THORACOSCOPY;  Surgeon: [name], MD;  Location: [place];  Service: Thoracic;  Laterality: Right;  [name] VIDEO ASSISTED THORACOSCOPY (VATS)/EMPYEMA Right 03/28/2018   Procedure: VIDEO ASSISTED THORACOSCOPY (VATS)/EMPYEMA;  Surgeon: [name], MD;  Location: [place];  Service: Thoracic;  Laterality: Right;  [name] VIDEO BRONCHOSCOPY WITH ENDOBRONCHIAL ULTRASOUND N/A 07/19/2016   Procedure: VIDEO BRONCHOSCOPY WITH ENDOBRONCHIAL ULTRASOUND;  Surgeon: [name], MD;  [facility];  Service: Thoracic;  Laterality: N/A;     SOCIAL HISTORY:  Social History   Socioeconomic History  . Marital status: Divorced    Spouse name: Not on file  . Number of children: Not on file  . Years of education: Not on file  . Highest education level: Not on file  Occupational History  . Not on file  Social Needs  . Financial resource strain: Not on file  . Food insecurity:    Worry: Not on file    Inability: Not on file  . Transportation needs:    Medical: Not on file    Non-medical: Not on file  Tobacco Use  . Smoking status: Former Smoker    Packs/day: 0.25    Years: 38.00    Pack years: 9.50    Types: Cigarettes    Last attempt to quit: 02/16/2018    Years since quitting: 0.2  . Smokeless tobacco: Former Systems developer    Types: Chew  . Tobacco comment: [place] [facility]AY  Substance and Sexual Activity  . Alcohol use: Yes    Alcohol/week: 1.2 oz    Types: 2 Cans of beer per week    Comment: reports drinking 0-2 beers a week  .  Drug use: Yes    Frequency: 7.0 times per week    Types: Marijuana    Comment: daily for cancer; pt denies any use at present time 08/23/17  . Sexual activity: Not Currently  Lifestyle  . Physical activity:    Days per week: Not on file    Minutes per session: Not on file  . Stress: Not on file  Relationships  . Social connections:    Talks on phone: Not on file    Gets together: Not on file    Attends religious service: Not on file    Active member of club or organization: Not on file    Attends meetings of clubs or organizations: Not on file    Relationship status: Not on file  . Intimate partner violence:    Fear of current or ex partner: Not on file    Emotionally abused: Not on file    Physically abused: Not on file    Forced sexual activity: Not on file  Other Topics Concern  . Not on file  Social History Narrative  . Not on file    FAMILY HISTORY:  Family History  Problem Relation Age of Onset  . Cancer Mother        breast cancer  . Heart failure Brother   . Colon cancer Neg Hx        not sure, ?grandfather and/or uncle    CURRENT MEDICATIONS:  Outpatient Encounter Medications as of 05/30/2018  Medication Sig Note  . acetaminophen (TYLENOL) 325 MG tablet Take 2 tablets (650 mg total) by mouth every 6 (six) hours as needed for mild pain (or Fever >/= 101).   [name] albuterol (PROVENTIL) (2.5 MG/3ML) 0.083% nebulizer solution Take 3 mLs (2.5 mg total) by nebulization every 6 (six) hours as needed for wheezing or shortness of breath (if unable to experience relief by using combivent inhaler).   . ALPRAZolam (XANAX) 1 MG tablet Take 1 mg by mouth 2 (two) times daily as needed for anxiety or sleep (takes 1 tablet and bedtime for sleep and 1 dose during the day only if needed).    [name] amLODipine (NORVASC) 5 MG tablet    . apixaban (ELIQUIS) 5 MG TABS  tablet Take 1 tablet (5 mg total) by mouth 2 (two) times daily.   . Artificial Tear Solution (SOOTHE XP) SOLN Apply 2 drops to  eye daily.   [name] escitalopram (LEXAPRO) 20 MG tablet    . gabapentin (NEURONTIN) 600 MG tablet Take 600 mg by mouth 2 (two) times daily.    [name] guaiFENesin (MUCINEX) 600 MG 12 hr tablet Take 600 mg by mouth 2 (two) times daily as needed (for congestion.).   [name] Ipratropium-Albuterol (COMBIVENT) 20-100 MCG/ACT AERS respimat Inhale 1 puff into the lungs every 6 (six) hours as needed for wheezing or shortness of breath. 03/23/2018: Needs refill-wasted medication learning how to use device  . lactulose (CHRONULAC) 10 GM/15ML solution Take 15 mLs (10 g total) by mouth 3 (three) times daily as needed for mild constipation or moderate constipation.   [name] levalbuterol (XOPENEX) 0.63 MG/3ML nebulizer solution Take 3 mLs (0.63 mg total) by nebulization every 8 (eight) hours as needed for wheezing or shortness of breath.   . linezolid (ZYVOX) 600 MG tablet Take 1 tablet (600 mg total) by mouth 2 (two) times daily.   [name] loperamide (IMODIUM) 2 MG capsule Take 1 capsule (2 mg total) by mouth as needed for diarrhea or loose stools.   . methadone (DOLOPHINE) 10 MG tablet Take 20 mg by mouth 5 (five) times daily. Takes 2 tablets 5 times daily 04/20/2018: LF on 03-29-18 #320 32 Day supply  . mirtazapine (REMERON) 15 MG tablet Take 1 tablet by mouth at bedtime.   . Misc. Devices MISC POC at 2 lpm via [place] with conserver / pulse ox on exertion.   . Multiple Vitamin (MULTIVITAMIN WITH MINERALS) TABS tablet Take 1 tablet by mouth daily. Centrum   . ondansetron (ZOFRAN) 8 MG tablet TAKE 1 TABLET BY MOUTH EVERY 8 HOURS AS NEEDED FOR NAUSEA AND VOMITING.   . OXYGEN Inhale 3 L into the lungs.   . pantoprazole (PROTONIX) 40 MG tablet TAKE (1) TABLET BY MOUTH TWICE DAILY.   [name] potassium chloride SA (K-DUR,KLOR-CON) 20 MEQ tablet Take 1 tablet (20 mEq total) by mouth daily.   [name] tiZANidine (ZANAFLEX) 4 MG tablet Take 4 mg by mouth every 8 (eight) hours as needed for muscle spasms.    . varenicline (CHANTIX) 0.5 MG tablet TAKE (2) TABLETS BY  MOUTH TWICE DAILY.    Facility-Administered Encounter Medications as of 05/30/2018  Medication  . 0.9 %  sodium chloride infusion    ALLERGIES:  Allergies  Allergen Reactions  . Demeclocycline Swelling    SWELLING REACTION UNSPECIFIED   . Tetracyclines & Related Swelling    SWELLING REACTION UNSPECIFIED      PHYSICAL EXAM:  ECOG Performance status: 2  Vitals:   05/30/18 1007  BP: 127/81  Pulse: (!) 109  Resp: 18  Temp: 98.8 F (37.1 C)  SpO2: 98%   Filed Weights   05/30/18 1007  Weight: 223 lb (101.2 kg)    Physical Exam HEENT: No thrush or other lesions. Chest: Good air entry on the left side.  Decreased breath sounds particularly at the base on the right side. CVS: S1-S2 regular rate and rhythm. Abdomen: Soft nontender with no masses. Extremities: Trace edema bilaterally. Skin: Right lateral chest wall wound VAC present.   LABORATORY DATA:  I have reviewed the labs as listed.  CBC    Component Value Date/Time   WBC 9.2 05/30/2018 1032   RBC 3.51 (L) 05/30/2018 1032   RBC 3.51 (L) 05/30/2018 1032   HGB  10.6 (L) 05/30/2018 1032   HCT 33.8 (L) 05/30/2018 1032   PLT 245 05/30/2018 1032   MCV 96.3 05/30/2018 1032   MCH 30.2 05/30/2018 1032   MCHC 31.4 05/30/2018 1032   RDW 19.1 (H) 05/30/2018 1032   LYMPHSABS 1.2 05/30/2018 1032   MONOABS 0.7 05/30/2018 1032   EOSABS 0.1 05/30/2018 1032   BASOSABS 0.0 05/30/2018 1032   CMP Latest Ref Rng & Units 05/30/2018 04/26/2018 04/23/2018  Glucose 70 - 99 mg/dL 141(H) 89 97  BUN 6 - 20 mg/dL _0 Creatinine 0.61 - 1.24 mg/dL 0.86 1.34(H) 1.53(H)  Sodium 135 - 145 mmol/L 137 146(H) 141  Potassium 3.5 - 5.1 mmol/L 3.7 3.9 3.5  Chloride 98 - 111 mmol/L 104 107 103  CO2 22 - 32 mmol/L _1 Calcium 8.9 - 10.3 mg/dL 8.0(L) 8.3(L) 7.8(L)  Total Protein 6.5 - 8.1 g/dL 7.7 - -  Total Bilirubin 0.3 - 1.2 mg/dL 0.5 - -  Alkaline Phos 38 - 126 U/L 249(H) - -  AST 15 - 41 U/L 48(H) - -  ALT 0 - 44 U/L 30 - -        DIAGNOSTIC IMAGING:  I have independently reviewed his previous CT scan of the chest from April 2019 and agree with report.     ASSESSMENT & PLAN:   Adenocarcinoma of right lung ([place]) 1.  Stage IV adenocarcinoma of the right lung: - Initially treated with cisplatin and VP-16 for stage III disease followed by Imfinzi from 12/02/2016 through 06/30/2017. -Tecentriq every 3 weeks from 08/04/2017 through 01/02/2018. 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  Chest tube was removed last week.  He has a follow-up with Dr. Roxan Hockey tomorrow for possible removal of VAC. - Left supraclavicular adenopathy was noted in May 2019.  Underwent needle biopsy on 05/15/2018 which shows metastatic poorly differentiated adenocarcinoma consistent with his lung primary. -His energy levels are slowly coming back to normal.  I have recommended restaging scans with CT scan of the chest, abdomen and pelvis.  His brain MRI was done in April 2019 which was negative for metastatic disease.  I have also recommended foundation 1 CDX testing to see if he has any targetable mutations.  We will see him back in 2 weeks for follow-up and discuss results and further plan.  2.  Lower extremity swelling: This has improved slightly.  This is from underlying cirrhosis.  3.  Pulmonary embolism: He is currently on Eliquis which he is tolerating reasonably well.  He has occasional blood-tinged sputum when he coughs.  4.  Normocytic anemia: We will do further work-up including ferritin, iron panel, W80, folic acid and SPEP today.      Orders placed this encounter:  Orders Placed This Encounter  Procedures  . CT Abdomen Pelvis W Contrast  . CT Chest W Contrast  . Comprehensive metabolic panel  . CBC with Differential  . Reticulocytes  . Lactate dehydrogenase  . Vitamin B12  .  Folate  . Iron and TIBC  . Ferritin  . Protein electrophoresis, serum      Derek Jack, MD [employer] [phone]

## 2018-05-31 ENCOUNTER — Ambulatory Visit
Admission: RE | Admit: 2018-05-31 | Discharge: 2018-05-31 | Disposition: A | Discharge status: Home / Self Care | Payer: Medicare Other | Source: Ambulatory Visit | Attending: Thoracic Surgery (Cardiothoracic Vascular Surgery) | Admitting: Thoracic Surgery (Cardiothoracic Vascular Surgery)

## 2018-05-31 ENCOUNTER — Encounter: Payer: Self-pay | Admitting: Thoracic Surgery (Cardiothoracic Vascular Surgery)

## 2018-05-31 ENCOUNTER — Ambulatory Visit (INDEPENDENT_AMBULATORY_CARE_PROVIDER_SITE_OTHER): Payer: Self-pay | Admitting: Thoracic Surgery (Cardiothoracic Vascular Surgery)

## 2018-05-31 ENCOUNTER — Telehealth: Payer: Self-pay

## 2018-05-31 ENCOUNTER — Encounter (HOSPITAL_COMMUNITY): Payer: Self-pay | Admitting: *Deleted

## 2018-05-31 VITALS — BP 134/96 | HR 100 | Resp 18 | Ht 72.0 in | Wt 218.0 lb

## 2018-05-31 DIAGNOSIS — T148XXA Other injury of unspecified body region, initial encounter: Secondary | ICD-10-CM

## 2018-05-31 DIAGNOSIS — Z85118 Personal history of other malignant neoplasm of bronchus and lung: Secondary | ICD-10-CM

## 2018-05-31 DIAGNOSIS — L089 Local infection of the skin and subcutaneous tissue, unspecified: Secondary | ICD-10-CM

## 2018-05-31 DIAGNOSIS — Z09 Encounter for follow-up examination after completed treatment for conditions other than malignant neoplasm: Secondary | ICD-10-CM

## 2018-05-31 DIAGNOSIS — J869 Pyothorax without fistula: Secondary | ICD-10-CM

## 2018-05-31 DIAGNOSIS — C3491 Malignant neoplasm of unspecified part of right bronchus or lung: Secondary | ICD-10-CM

## 2018-05-31 LAB — PROTEIN ELECTROPHORESIS, SERUM
A/G RATIO SPE: 0.5 — AB (ref 0.7–1.7)
ALPHA-2-GLOBULIN: 0.9 g/dL (ref 0.4–1.0)
Albumin ELP: 2.5 g/dL — ABNORMAL LOW (ref 2.9–4.4)
Alpha-1-Globulin: 0.2 g/dL (ref 0.0–0.4)
BETA GLOBULIN: 1.1 g/dL (ref 0.7–1.3)
Gamma Globulin: 2.5 g/dL — ABNORMAL HIGH (ref 0.4–1.8)
Globulin, Total: 4.7 g/dL — ABNORMAL HIGH (ref 2.2–3.9)
Total Protein ELP: 7.2 g/dL (ref 6.0–8.5)

## 2018-05-31 NOTE — Progress Notes (Signed)
[address]       [place] [address]             [phone]     HPI: Erik [name] returns for scheduled follow-up visit  Erik [name] is a 57 year old man with metastatic lung cancer.  He was originally diagnosed with stage IIIa disease and underwent chemoradiation.  He subsequently developed metastatic disease.  He became ill in April.  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  I did right VATS to drain the empyema and attempt decortication on 03/28/2018.  He did not achieve full reexpansion of the lung.  An initial air leak resolved and he was discharged on 04/08/2018.  He presented back 2 weeks later with fever and tachycardia.  I taken back to the OR and did an incision and drainage of his chest wound and placed a VAC and also placed a new drain in his chest.  Cultures grew Vanco resistant enterococcus which is being treated with linezolid.  I saw him in the office last week and he was doing well he had no chest tube drainage.  I removed the tube.  His wound was healing well and we continued with the VAC.  He says that he continues to slowly feel better.  He feels a little stronger.  He has not had any fevers or chills.  His respiratory status is slightly better than it was a week ago.  He saw Dr. Delton Coombes yesterday.  Past Medical History:  Diagnosis Date  . 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 Arthritis   . Asthma   . Chronic back pain   . COPD (chronic obstructive pulmonary disease) ([place])   . Depression   . Dyspnea    with exertion   . GERD (gastroesophageal reflux disease)   . Hypertension   . Pneumonia   . Pulmonary embolus ([place])   . Seasonal allergies   . Spinal stenosis of lumbar region   . 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 Wheezing     Current Outpatient Medications  Medication Sig Dispense Refill  . acetaminophen (TYLENOL) 325 MG tablet Take 2 tablets (650 mg total) by mouth every 6 (six) hours as needed for  mild pain (or Fever >/= 101).    [name] albuterol (PROVENTIL) (2.5 MG/3ML) 0.083% nebulizer solution Take 3 mLs (2.5 mg total) by nebulization every 6 (six) hours as needed for wheezing or shortness of breath (if unable to experience relief by using combivent inhaler). 75 mL 4  . ALPRAZolam (XANAX) 1 MG tablet Take 1 mg by mouth 2 (two) times daily as needed for anxiety or sleep (takes 1 tablet and bedtime for sleep and 1 dose during the day only if needed).     [name] amLODipine (NORVASC) 5 MG tablet     . apixaban (ELIQUIS) 5 MG TABS tablet Take 1 tablet (5 mg total) by mouth 2 (two) times daily. 60 tablet 2  . Artificial Tear Solution (SOOTHE XP) SOLN Apply 2 drops to eye daily.    [name] escitalopram (LEXAPRO) 20 MG tablet     . gabapentin (NEURONTIN) 600 MG tablet Take 600 mg by mouth 2 (two) times daily.     [name] guaiFENesin (MUCINEX) 600 MG 12 hr tablet Take 600 mg by mouth 2 (two) times daily as needed (for congestion.).    [name] Ipratropium-Albuterol (COMBIVENT) 20-100 MCG/ACT AERS respimat Inhale 1 puff into the lungs every 6 (six) hours as  needed for wheezing or shortness of breath. 1 Inhaler 3  . lactulose (CHRONULAC) 10 GM/15ML solution Take 15 mLs (10 g total) by mouth 3 (three) times daily as needed for mild constipation or moderate constipation. 240 mL 0  . levalbuterol (XOPENEX) 0.63 MG/3ML nebulizer solution Take 3 mLs (0.63 mg total) by nebulization every 8 (eight) hours as needed for wheezing or shortness of breath. 3 mL 12  . linezolid (ZYVOX) 600 MG tablet Take 1 tablet (600 mg total) by mouth 2 (two) times daily. 68 tablet 0  . loperamide (IMODIUM) 2 MG capsule Take 1 capsule (2 mg total) by mouth as needed for diarrhea or loose stools. 20 capsule 0  . methadone (DOLOPHINE) 10 MG tablet Take 20 mg by mouth 5 (five) times daily. Takes 2 tablets 5 times daily    . mirtazapine (REMERON) 15 MG tablet Take 1 tablet by mouth at bedtime.    . Misc. Devices MISC POC at 2 lpm via [place] with conserver / pulse ox  on exertion. 1 each 0  . Multiple Vitamin (MULTIVITAMIN WITH MINERALS) TABS tablet Take 1 tablet by mouth daily. Centrum    . ondansetron (ZOFRAN) 8 MG tablet TAKE 1 TABLET BY MOUTH EVERY 8 HOURS AS NEEDED FOR NAUSEA AND VOMITING. 60 tablet 1  . OXYGEN Inhale 3 L into the lungs.    . pantoprazole (PROTONIX) 40 MG tablet TAKE (1) TABLET BY MOUTH TWICE DAILY. 60 tablet 0  . potassium chloride SA (K-DUR,KLOR-CON) 20 MEQ tablet Take 1 tablet (20 mEq total) by mouth daily. 60 tablet 1  . tiZANidine (ZANAFLEX) 4 MG tablet Take 4 mg by mouth every 8 (eight) hours as needed for muscle spasms.     . varenicline (CHANTIX) 0.5 MG tablet TAKE (2) TABLETS BY MOUTH TWICE DAILY. 112 tablet 0   No current facility-administered medications for this visit.    Facility-Administered Medications Ordered in Other Visits  Medication Dose Route Frequency Provider Last Rate Last Dose  . 0.9 %  sodium chloride infusion   Intravenous Continuous [facility], PA-C 10 mL/hr at 01/13/17 1330      Physical Exam BP (!) 134/96 (BP Location: Right Arm, Patient Position: Sitting, Cuff Size: Large)   Pulse 100   Resp 18   Ht 6' (1.829 m)   Wt 218 lb (98.9 kg)   SpO2 97% Comment: on 3L O2  BMI 29.61 kg/m  57 year old man in no acute distress Alert and oriented x3 with no focal deficits Left lung clear, right lung diminished, but breath sounds present Cardiac regular rate and rhythm Incision clean and granulating.  Only a couple of millimeters deep at this point  Diagnostic Tests: Chest x-ray unchanged  Impression: Erik [name] is a 57 year old gentleman with metastatic lung cancer who had pneumonia complicated by empyema with a bronchopleural fistula.  He has had a long course recovering from that as expected.  His tube is been out for a week now and is not had any adverse effect from that being removed.  His chest wound is healing beautifully.  I do not think there is any longer a large enough wound for a VAC and we  will change to normal saline wet-to-dry dressings.  He saw Dr. Delton Coombes.  He has a follow-up scheduled for a couple of weeks to initiate treatment.  Plan: DC wound VAC Normal saline wet-to-dry dressing changes right chest wound Follow-up in 3 weeks to check on progress  [name], MD [facility] [phone]

## 2018-05-31 NOTE — Telephone Encounter (Signed)
Per Dr. Roxan Hockey during Erik [name]'s follow-up appointment, patient is no longer using wound vac for right VATS site.  Patient is now instructed to apply wet-to-dry dressing daily.  [name] [phone] about this change and left a VM of changes and to contact the office for any further questions.  Will continue to monitor.

## 2018-05-31 NOTE — Progress Notes (Signed)
I called [facility] and requested that [phone] had Foundation One and PDL1 performed. Dr. Raliegh Ip stated that if there is not enough tissue to order Foundation One and leave the PDL1 off - this was relayed to [facility] in Pathology. Stage IV Lung. C34.90 diagnosis code given.

## 2018-06-05 ENCOUNTER — Other Ambulatory Visit: Payer: Self-pay | Admitting: Physician Assistant

## 2018-06-05 ENCOUNTER — Telehealth: Payer: Self-pay

## 2018-06-05 NOTE — Telephone Encounter (Signed)
Erik [name] called concerned about needed a refill of his antibiotics.  He was concerned he was going to run out of the medication and thus get an infection.  I advised Erik [name] that when the antibiotic is gone, he has completed the course and no refill is needed.  He acknowledged receipt.  He also stated that his 2 small chest tube wounds were completely healed, and the larger VATS site on the right side was looking nicely.  He stated that the home health nurse is still changing the dressings.

## 2018-06-07 ENCOUNTER — Telehealth: Payer: Self-pay

## 2018-06-07 ENCOUNTER — Encounter (HOSPITAL_COMMUNITY): Payer: Self-pay | Admitting: Hematology

## 2018-06-07 NOTE — Telephone Encounter (Signed)
Tammy nurse with [facility] called to get verbal orders to reduce home visits to 2x per week due to wound vac being removed.  VO given and message left with orders.  Will await return phone call if warranted.

## 2018-06-12 ENCOUNTER — Ambulatory Visit (HOSPITAL_COMMUNITY): Payer: Medicare Other | Admitting: Internal Medicine

## 2018-06-14 ENCOUNTER — Other Ambulatory Visit (HOSPITAL_COMMUNITY): Payer: Self-pay | Admitting: *Deleted

## 2018-06-14 ENCOUNTER — Other Ambulatory Visit (HOSPITAL_COMMUNITY): Payer: Self-pay | Admitting: Nurse Practitioner

## 2018-06-14 DIAGNOSIS — C3491 Malignant neoplasm of unspecified part of right bronchus or lung: Secondary | ICD-10-CM

## 2018-06-14 DIAGNOSIS — K219 Gastro-esophageal reflux disease without esophagitis: Secondary | ICD-10-CM

## 2018-06-14 MED ORDER — PANTOPRAZOLE SODIUM 40 MG PO TBEC: 40.0000 mg | DELAYED_RELEASE_TABLET | Freq: Two times a day (BID) | ORAL | 0 refills | Status: DC

## 2018-06-14 MED ORDER — APIXABAN 5 MG PO TABS: 5.0000 mg | ORAL_TABLET | Freq: Two times a day (BID) | ORAL | 2 refills | Status: DC

## 2018-06-19 ENCOUNTER — Ambulatory Visit (HOSPITAL_COMMUNITY)
Admission: RE | Admit: 2018-06-19 | Discharge: 2018-06-19 | Disposition: A | Discharge status: Home / Self Care | Payer: Medicare Other | Source: Ambulatory Visit | Attending: Hematology | Admitting: Hematology

## 2018-06-19 DIAGNOSIS — J90 Pleural effusion, not elsewhere classified: Secondary | ICD-10-CM | POA: Diagnosis not present

## 2018-06-19 DIAGNOSIS — I313 Pericardial effusion (noninflammatory): Secondary | ICD-10-CM | POA: Diagnosis not present

## 2018-06-19 DIAGNOSIS — C3491 Malignant neoplasm of unspecified part of right bronchus or lung: Secondary | ICD-10-CM | POA: Insufficient documentation

## 2018-06-19 DIAGNOSIS — R161 Splenomegaly, not elsewhere classified: Secondary | ICD-10-CM | POA: Diagnosis not present

## 2018-06-19 DIAGNOSIS — R590 Localized enlarged lymph nodes: Secondary | ICD-10-CM | POA: Insufficient documentation

## 2018-06-19 DIAGNOSIS — K7460 Unspecified cirrhosis of liver: Secondary | ICD-10-CM | POA: Diagnosis not present

## 2018-06-19 MED ORDER — IOPAMIDOL (ISOVUE-300) INJECTION 61%
100.0000 mL | Freq: Once | INTRAVENOUS | Status: AC | PRN
  Administered 2018-06-19: 100 mL via INTRAVENOUS

## 2018-06-20 ENCOUNTER — Encounter (HOSPITAL_COMMUNITY): Payer: Self-pay | Admitting: Hematology

## 2018-06-20 ENCOUNTER — Inpatient Hospital Stay (HOSPITAL_COMMUNITY): Payer: Medicare Other | Attending: Internal Medicine | Admitting: Hematology

## 2018-06-20 ENCOUNTER — Ambulatory Visit (INDEPENDENT_AMBULATORY_CARE_PROVIDER_SITE_OTHER): Payer: Medicare Other | Admitting: Thoracic Surgery (Cardiothoracic Vascular Surgery)

## 2018-06-20 ENCOUNTER — Other Ambulatory Visit: Payer: Self-pay

## 2018-06-20 ENCOUNTER — Encounter: Payer: Medicare Other | Admitting: Thoracic Surgery (Cardiothoracic Vascular Surgery)

## 2018-06-20 VITALS — BP 150/100 | HR 102 | Temp 98.7°F | Resp 20 | Ht 72.0 in | Wt 221.0 lb

## 2018-06-20 DIAGNOSIS — C3491 Malignant neoplasm of unspecified part of right bronchus or lung: Secondary | ICD-10-CM

## 2018-06-20 DIAGNOSIS — Z5111 Encounter for antineoplastic chemotherapy: Secondary | ICD-10-CM | POA: Diagnosis not present

## 2018-06-20 DIAGNOSIS — C770 Secondary and unspecified malignant neoplasm of lymph nodes of head, face and neck: Secondary | ICD-10-CM | POA: Diagnosis not present

## 2018-06-20 DIAGNOSIS — Z923 Personal history of irradiation: Secondary | ICD-10-CM | POA: Diagnosis not present

## 2018-06-20 DIAGNOSIS — I2699 Other pulmonary embolism without acute cor pulmonale: Secondary | ICD-10-CM | POA: Diagnosis not present

## 2018-06-20 DIAGNOSIS — L089 Local infection of the skin and subcutaneous tissue, unspecified: Secondary | ICD-10-CM

## 2018-06-20 DIAGNOSIS — K7460 Unspecified cirrhosis of liver: Secondary | ICD-10-CM | POA: Insufficient documentation

## 2018-06-20 DIAGNOSIS — Z7901 Long term (current) use of anticoagulants: Secondary | ICD-10-CM | POA: Diagnosis not present

## 2018-06-20 DIAGNOSIS — Z79899 Other long term (current) drug therapy: Secondary | ICD-10-CM | POA: Insufficient documentation

## 2018-06-20 DIAGNOSIS — D649 Anemia, unspecified: Secondary | ICD-10-CM | POA: Insufficient documentation

## 2018-06-20 DIAGNOSIS — C3431 Malignant neoplasm of lower lobe, right bronchus or lung: Secondary | ICD-10-CM | POA: Insufficient documentation

## 2018-06-20 DIAGNOSIS — T148XXA Other injury of unspecified body region, initial encounter: Secondary | ICD-10-CM

## 2018-06-20 DIAGNOSIS — M7989 Other specified soft tissue disorders: Secondary | ICD-10-CM | POA: Diagnosis not present

## 2018-06-20 DIAGNOSIS — J869 Pyothorax without fistula: Secondary | ICD-10-CM

## 2018-06-20 NOTE — Patient Instructions (Signed)
[facility] at [facility] Discharge Instructions  Today you saw Dr. K.   Thank you for choosing [facility] at [facility] to provide your oncology and hematology care.  To afford each patient quality time with our provider, please arrive at least 15 minutes before your scheduled appointment time.   If you have a lab appointment with the Cancer Center please come in thru the  Main Entrance and check in at the main information desk  You need to re-schedule your appointment should you arrive 10 or more minutes late.  We strive to give you quality time with our providers, and arriving late affects you and other patients whose appointments are after yours.  Also, if you no show three or more times for appointments you may be dismissed from the clinic at the providers discretion.     Again, thank you for choosing [facility].  Our hope is that these requests will decrease the amount of time that you wait before being seen by our physicians.       _____________________________________________________________  Should you have questions after your visit to  Cancer Center, please contact our office at [phone] between the hours of 8:30 a.m. and 4:30 p.m.  Voicemails left after 4:30 p.m. will not be returned until the following business day.  For prescription refill requests, have your pharmacy contact our office.       Resources For Cancer Patients and their Caregivers ? American Cancer Society: Can assist with transportation, wigs, general needs, runs Look Good Feel Better.        1-888-227-6333 ? Cancer Care: Provides financial assistance, online support groups, medication/co-pay assistance.  1-800-813-HOPE (4673) ? [facility] Assists [place] cancer patients and their families through emotional , educational and financial support.  336-427-4357 ? [place] DSS Where to apply for food  stamps, Medicaid and utility assistance. 336-342-1394 ? [facility]: Transportation to medical appointments. 336-347-2287 ? Social Security Administration: May apply for disability if have a Stage IV cancer. 336-342-7796 1-800-772-1213 ? [place] Aging, Disability and Transit Services: Assists with nutrition, care and transit needs. 336-349-2343  Cancer Center Support Programs:   > Cancer Support Group  2nd Tuesday of the month 1pm-2pm, Journey Room   > Creative Journey  3rd Tuesday of the month 1130am-1pm, Journey Room    

## 2018-06-20 NOTE — Progress Notes (Signed)
[place], [place] [address]   CLINIC:  Medical Oncology/Hematology  PCP:  [name], [place] [address] [phone]   REASON FOR VISIT:  Follow-up for metastatic lung cancer.  CURRENT THERAPY: Observation.  BRIEF ONCOLOGIC HISTORY:    Adenocarcinoma of right lung ([place])   07/01/2016 PET scan    6.3 x 3.7 cm right lower lobe mass, suspicious for primary bronchogenic neoplasm, as described above. Hypermetabolic thoracic nodal metastases, as above. Additional right perihilar hypermetabolism, indeterminate. Associated right middle lobe atelectasis/collapse.      07/19/2016 Procedure    Bronchoscopy with brushings and biopsies and endobronchial ultrasound with mediastinal lymph node aspirations by Dr. Roxan Hockey      07/21/2016 Pathology Results    Lung, biopsy, Right Middle Lobe - LUNG TISSUE WITH SQUAMOUS METAPLASIA. - NO MALIGNANCY IDENTIFIED.      07/21/2016 Pathology Results    FINE NEEDLE ASPIRATION, ENDOSCOPIC (A) LEVEL 7 (SPECIMEN 1 OF 3, COLLECTED ON 07/19/16): MALIGNANT CELLS CONSISTENT WITH ADENOCARCINOMA.      07/21/2016 Pathology Results    FINE NEEDLE ASPIRATION, EBUS, 4R, B (SPECIMEN 2 OF 3, COLLECTED 07/19/16): MALIGNANT CELLS CONSISTENT WITH ADENOCARCINOMA.      07/21/2016 Pathology Results    FINE NEEDLE ASPIRATION, EBUS, BRUSHING, RIGHT MIDDLE LOBE, D (SPECIMEN 3 OF 3, COLLECTED 07/19/16): MALIGNANT CELLS CONSISTENT WITH ADENOCARCINOMA.      07/22/2016 Imaging    MRI brain- No evidence of intracranial metastases.      08/17/2016 - 09/20/2016 Chemotherapy    The patient had palonosetron (ALOXI) injection 0.25 mg, 0.25 mg, Intravenous,  Once, 1 of 1 cycle  CISplatin (PLATINOL) 123 mg in sodium chloride 0.9 % 500 mL chemo infusion, 50 mg/m2 = 123 mg, Intravenous,  Once, 1 of 1 cycle  etoposide (VEPESID) 120 mg in sodium chloride 0.9 % 500 mL chemo infusion, 50 mg/m2 = 120 mg,  Intravenous,  Once, 1 of 1 cycle  fosaprepitant (EMEND) 150 mg, dexamethasone (DECADRON) 12 mg in sodium chloride 0.9 % 145 mL IVPB, , Intravenous,  Once, 1 of 1 cycle  ondansetron (ZOFRAN) 8 mg in sodium chloride 0.9 % 50 mL IVPB, , Intravenous,  Once, 2 of 6 cycles  for chemotherapy treatment.        08/17/2016 -  Radiation Therapy         11/15/2016 Imaging    CT CAP- Interval response to therapy. The previously demonstrated mediastinal and right hilar adenopathy and resulting right middle lobe atelectasis have all improved. 2. No discrete residual lung masses are identified. There is new multifocal ground-glass opacity within the right lower lobe, and to a lesser extent in the left upper lobe. These are probably inflammatory/treatment related. 3. No evidence of abdominopelvic metastatic disease. Stable prominent lymph nodes in the upper abdomen, likely reactive. 4. Decompressed mid SVC without specific signs of SVC occlusion.      12/02/2016 -  Chemotherapy    Imfinzi (durvalumab) immunotherapy every 2 weeks x up to 1 year      01/03/2017 Procedure    EGD by Dr. [name], colonoscopy aborted as "patient forgot to take other half of preparation). Esophagitis. likely radiation-induced ?Dilated.  Erythematous mucosa in the stomach. Biopsied. Normal duodenal bulb and second portion of the duodenum.      02/14/2017 Imaging    CT chest- 1. Development of a small to moderate right-sided pleural effusion with minimal loculation anteriorly. 2. Worsened right-sided aeration with right middle  and lower lobe consolidation. As this has a geographic distribution, this could be radiation induced or represent infection. Depending on clinical concern of progressive disease, thoracentesis and/or PET may be informative. 3. Development of mild right paratracheal adenopathy, most likely reactive. Recommend attention on follow-up. 4. Subtle findings which are highly suspicious for mild  cirrhosis. Upper abdominal adenopathy is similar and likely reactive. 5. Persistent right lower and improved left upper lobe ground-glass opacities are favored to be infectious or inflammatory.      06/01/2017 Imaging    CT chest  IMPRESSION: 1. Evolving radiation changes in the medial right hemithorax. 2. Stable moderate size right pleural effusion without definite nodular components. 3. No signs of local recurrence or progressive metastatic disease. No residual enlarged thoracic lymph nodes. 4. Stable appearance of the visualized upper abdomen with probable cirrhosis and reactive adenopathy in the porta hepatis.      07/15/2017 Procedure    Therapeutic thoracentesis performed today; 1.3 L removed.  Fluid sent for cytology.       07/15/2017 Pathology Results    Pleural fluid NEGATIVE for malignancy by cytology.       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 Paramediastinal radiation change and loculated right pleural effusion appear unchanged from 07/14/2017. 2. Borderline prevascular lymph node within the anterior mediastinum is mildly increased in size from previous exam. 3. There is a new 5 mm lung nodule within the left lower lobe. Nonspecific in appearance. Attention on follow-up imaging advise. Other small nonspecific nodules in the left lung are stable. 4. Morphologic features of the liver compatible with cirrhosis. Enlarged upper abdominal lymph nodes are nonspecific and likely reactive. 5. Stable indeterminate low-attenuation focus in the posterior right liver dome. Previously characterized on MRI from 07/29/2017 as reflecting post treatment changes from external beam radiation to the lung. 6.  Emphysema (ICD10-J43.9).        CANCER STAGING: Cancer Staging Adenocarcinoma of right lung [facility]) Staging form: Lung, AJCC 7th Edition - Clinical stage from 07/23/2016:  Stage IIIA (T2b, N2, M0) - Signed by Baird Cancer, PA-C on 07/23/2016 - Clinical stage from 07/28/2017: Stage IV (M1a) - Signed by Holley Bouche, NP on 07/28/2017    INTERVAL HISTORY:  Erik [name] 57 y.o. male returns for follow-up of the results of the CT scan and foundation 1 testing.  His wound VAC was removed 2 weeks ago from the right chest.  His breathing has been stable.  His energy levels are rather low but stable.  Denies any cough or hemoptysis.  Appetite is about 25%.   REVIEW OF SYSTEMS:  Review of Systems  Constitutional: Positive for appetite change and fatigue.  Respiratory: Positive for shortness of breath.   All other systems reviewed and are negative.    PAST MEDICAL/SURGICAL HISTORY:  Past Medical History:  Diagnosis Date  . Arnold-Chiari syndrome ([place])   . Arthritis   . Asthma   . Chronic back pain   . COPD (chronic obstructive pulmonary disease) ([place])   . Depression   . Dyspnea    with exertion   . GERD (gastroesophageal reflux disease)   . Hypertension   . Pneumonia   . Pulmonary embolus ([place])   . Seasonal allergies   . Spinal stenosis of lumbar region   . 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 Wheezing    Past Surgical  History:  Procedure Laterality Date  . APPLICATION OF WOUND VAC  04/19/2018   Procedure: APPLICATION OF WOUND VAC;  Surgeon: [name], MD;  Location: [facility];  Service: Vascular;;  . APPLICATION OF WOUND VAC Right 04/21/2018   Procedure: WOUND VAC CHANGE;  Surgeon: [name], MD;  Location: [facility];  Service: Thoracic;  Laterality: Right;  . APPLICATION OF WOUND VAC N/A 04/24/2018   Procedure: WOUND VAC CHANGE;  Surgeon: [name], MD;  Location: [facility];  Service: Thoracic;  Laterality: N/A;  . BACK SURGERY     5 total  . BIOPSY  01/03/2017   Procedure: BIOPSY;  Surgeon: Daneil Dolin, MD;  [facility];  Service: Endoscopy;;  gastric  . CHEST TUBE INSERTION Right  04/19/2018   Procedure: CHEST TUBE INSERTION;  Surgeon: [name], MD;  Location: [facility];  Service: Vascular;  Laterality: Right;  . CHEST TUBE INSERTION Right 04/19/2018   Procedure: CHEST TUBE INSERTION;  Surgeon: [name], MD;  Location: [facility];  Service: Thoracic;  Laterality: Right;  . COLONOSCOPY WITH PROPOFOL N/A 04/04/2017   Procedure: COLONOSCOPY WITH PROPOFOL;  Surgeon: Daneil Dolin, MD;  [facility];  Service: Endoscopy;  Laterality: N/A;  8:45am  . DECORTICATION Right 03/28/2018   Procedure: DECORTICATION;  Surgeon: [name], MD;  [facility];  Service: Thoracic;  Laterality: Right;  . ESOPHAGOGASTRODUODENOSCOPY (EGD) WITH PROPOFOL N/A 01/03/2017   Procedure: ESOPHAGOGASTRODUODENOSCOPY (EGD) WITH PROPOFOL;  Surgeon: Daneil Dolin, MD;  [facility];  Service: Endoscopy;  Laterality: N/A;  . I&D EXTREMITY Right 04/19/2018   Procedure: IRRIGATION AND DEBRIDEMENT RIGHT CHEST WOUND;  Surgeon: [name], MD;  Location: [facility];  Service: Vascular;  Laterality: Right;  . MALONEY DILATION N/A 01/03/2017   Procedure: Venia Minks DILATION;  Surgeon: Daneil Dolin, MD;  [facility];  Service: Endoscopy;  Laterality: N/A;  . MULTIPLE EXTRACTIONS WITH ALVEOLOPLASTY N/A 07/08/2014   Procedure: MULTIPLE EXTRACION WITH ALVEOLOPLASTY with EXCISION LESION RIGHT SIDE OF TONGUE;  Surgeon: Gae Bon, DDS;  Location: [facility];  Service: Oral Surgery;  Laterality: N/A;  . PORTACATH PLACEMENT Right 07/30/2016   Procedure: INSERTION PORT-A-CATH;  Surgeon: Vickie Epley, MD;  [facility];  Service: Vascular;  Laterality: Right;  . THORACOTOMY  04/19/2018   Procedure: THORACOTOMY MAJOR;  Surgeon: [name], MD;  Location: [facility];  Service: Vascular;;  . VIDEO ASSISTED THORACOSCOPY Right 04/21/2018   Procedure: VIDEO ASSISTED THORACOSCOPY;  Surgeon: [name], MD;  Location: [facility];  Service: Thoracic;   Laterality: Right;  [name] VIDEO ASSISTED THORACOSCOPY Right 04/24/2018   Procedure: VIDEO ASSISTED THORACOSCOPY;  Surgeon: [name], MD;  Location: [facility];  Service: Thoracic;  Laterality: Right;  [name] VIDEO ASSISTED THORACOSCOPY (VATS)/EMPYEMA Right 03/28/2018   Procedure: VIDEO ASSISTED THORACOSCOPY (VATS)/EMPYEMA;  Surgeon: [name], MD;  Location: [facility];  Service: Thoracic;  Laterality: Right;  [name] VIDEO BRONCHOSCOPY WITH ENDOBRONCHIAL ULTRASOUND N/A 07/19/2016   Procedure: VIDEO BRONCHOSCOPY WITH ENDOBRONCHIAL ULTRASOUND;  Surgeon: [name], MD;  Location: [facility];  Service: Thoracic;  Laterality: N/A;     SOCIAL HISTORY:  Social History   Socioeconomic History  . Marital status: Divorced    Spouse name: Not on file  . Number of children: Not on file  . Years of education: Not on file  . Highest education level: Not on file  Occupational History  .  Not on file  Social Needs  . Financial resource strain: Not on file  . Food insecurity:    Worry: Not on file    Inability: Not on file  . Transportation needs:    Medical: Not on file    Non-medical: Not on file  Tobacco Use  . Smoking status: Former Smoker    Packs/day: 0.25    Years: 38.00    Pack years: 9.50    Types: Cigarettes    Last attempt to quit: 02/16/2018    Years since quitting: 0.3  . Smokeless tobacco: Former Systems developer    Types: Chew  . Tobacco comment: [place] STAY  Substance and Sexual Activity  . Alcohol use: Yes    Alcohol/week: 1.2 oz    Types: 2 Cans of beer per week    Comment: reports drinking 0-2 beers a week  . Drug use: Yes    Frequency: 7.0 times per week    Types: Marijuana    Comment: daily for cancer; pt denies any use at present time 08/23/17  . Sexual activity: Not Currently  Lifestyle  . Physical activity:    Days per week: Not on file    Minutes per session: Not on file  . Stress: Not on file  Relationships  . Social connections:    Talks on  phone: Not on file    Gets together: Not on file    Attends religious service: Not on file    Active member of club or organization: Not on file    Attends meetings of clubs or organizations: Not on file    Relationship status: Not on file  . Intimate partner violence:    Fear of current or ex partner: Not on file    Emotionally abused: Not on file    Physically abused: Not on file    Forced sexual activity: Not on file  Other Topics Concern  . Not on file  Social History Narrative  . Not on file    FAMILY HISTORY:  Family History  Problem Relation Age of Onset  . Cancer Mother        breast cancer  . Heart failure Brother   . Colon cancer Neg Hx        not sure, ?grandfather and/or uncle    CURRENT MEDICATIONS:  Outpatient Encounter Medications as of 06/20/2018  Medication Sig Note  . acetaminophen (TYLENOL) 325 MG tablet Take 2 tablets (650 mg total) by mouth every 6 (six) hours as needed for mild pain (or Fever >/= 101).   [name] albuterol (PROVENTIL) (2.5 MG/3ML) 0.083% nebulizer solution Take 3 mLs (2.5 mg total) by nebulization every 6 (six) hours as needed for wheezing or shortness of breath (if unable to experience relief by using combivent inhaler).   . ALPRAZolam (XANAX) 1 MG tablet Take 1 mg by mouth 2 (two) times daily as needed for anxiety or sleep (takes 1 tablet and bedtime for sleep and 1 dose during the day only if needed).    [name] amLODipine (NORVASC) 5 MG tablet    . apixaban (ELIQUIS) 5 MG TABS tablet Take 1 tablet (5 mg total) by mouth 2 (two) times daily.   [name] apixaban (ELIQUIS) 5 MG TABS tablet Take 1 tablet (5 mg total) by mouth 2 (two) times daily.   . Artificial Tear Solution (SOOTHE XP) SOLN Apply 2 drops to eye daily.   [name] escitalopram (LEXAPRO) 20 MG tablet    . gabapentin (NEURONTIN) 600  MG tablet Take 600 mg by mouth 2 (two) times daily.    [name] guaiFENesin (MUCINEX) 600 MG 12 hr tablet Take 600 mg by mouth 2 (two) times daily as needed (for congestion.).   [name]  Ipratropium-Albuterol (COMBIVENT) 20-100 MCG/ACT AERS respimat Inhale 1 puff into the lungs every 6 (six) hours as needed for wheezing or shortness of breath. 03/23/2018: Needs refill-wasted medication learning how to use device  . lactulose (CHRONULAC) 10 GM/15ML solution Take 15 mLs (10 g total) by mouth 3 (three) times daily as needed for mild constipation or moderate constipation.   [name] levalbuterol (XOPENEX) 0.63 MG/3ML nebulizer solution Take 3 mLs (0.63 mg total) by nebulization every 8 (eight) hours as needed for wheezing or shortness of breath.   . loperamide (IMODIUM) 2 MG capsule Take 1 capsule (2 mg total) by mouth as needed for diarrhea or loose stools.   . methadone (DOLOPHINE) 10 MG tablet Take 20 mg by mouth 5 (five) times daily. Takes 2 tablets 5 times daily 04/20/2018: LF on 03-29-18 #320 32 Day supply  . mirtazapine (REMERON) 15 MG tablet Take 1 tablet by mouth at bedtime.   . Misc. Devices MISC POC at 2 lpm via [place] with conserver / pulse ox on exertion.   . Multiple Vitamin (MULTIVITAMIN WITH MINERALS) TABS tablet Take 1 tablet by mouth daily. Centrum   . ondansetron (ZOFRAN) 8 MG tablet TAKE 1 TABLET BY MOUTH EVERY 8 HOURS AS NEEDED FOR NAUSEA AND VOMITING.   . OXYGEN Inhale 3 L into the lungs.   . pantoprazole (PROTONIX) 40 MG tablet Take 1 tablet (40 mg total) by mouth 2 (two) times daily.   . potassium chloride SA (K-DUR,KLOR-CON) 20 MEQ tablet Take 1 tablet (20 mEq total) by mouth daily.   [name] tiZANidine (ZANAFLEX) 4 MG tablet Take 4 mg by mouth every 8 (eight) hours as needed for muscle spasms.    . varenicline (CHANTIX) 0.5 MG tablet TAKE (2) TABLETS BY MOUTH TWICE DAILY.    Facility-Administered Encounter Medications as of 06/20/2018  Medication  . 0.9 %  sodium chloride infusion    ALLERGIES:  Allergies  Allergen Reactions  . Demeclocycline Swelling    SWELLING REACTION UNSPECIFIED   . Tetracyclines & Related Swelling    SWELLING REACTION UNSPECIFIED      PHYSICAL  EXAM:  ECOG Performance status: 1  Vitals:   06/20/18 1329  BP: (!) 153/93  Pulse: (!) 120  Resp: 20  Temp: 99.1 F (37.3 C)  SpO2: 97%   Filed Weights   06/20/18 1329  Weight: 230 lb (104.3 kg)    Physical Exam   LABORATORY DATA:  I have reviewed the labs as listed.  CBC    Component Value Date/Time   WBC 9.2 05/30/2018 1032   RBC 3.51 (L) 05/30/2018 1032   RBC 3.51 (L) 05/30/2018 1032   HGB 10.6 (L) 05/30/2018 1032   HCT 33.8 (L) 05/30/2018 1032   PLT 245 05/30/2018 1032   MCV 96.3 05/30/2018 1032   MCH 30.2 05/30/2018 1032   MCHC 31.4 05/30/2018 1032   RDW 19.1 (H) 05/30/2018 1032   LYMPHSABS 1.2 05/30/2018 1032   MONOABS 0.7 05/30/2018 1032   EOSABS 0.1 05/30/2018 1032   BASOSABS 0.0 05/30/2018 1032   CMP Latest Ref Rng & Units 05/30/2018 04/26/2018 04/23/2018  Glucose 70 - 99 mg/dL 141(H) 89 97  BUN 6 - 20 mg/dL _0 Creatinine 0.61 - 1.24 mg/dL 0.86 1.34(H) 1.53(H)  Sodium 135 -  145 mmol/L 137 146(H) 141  Potassium 3.5 - 5.1 mmol/L 3.7 3.9 3.5  Chloride 98 - 111 mmol/L 104 107 103  CO2 22 - 32 mmol/L _0 Calcium 8.9 - 10.3 mg/dL 8.0(L) 8.3(L) 7.8(L)  Total Protein 6.5 - 8.1 g/dL 7.7 - -  Total Bilirubin 0.3 - 1.2 mg/dL 0.5 - -  Alkaline Phos 38 - 126 U/L 249(H) - -  AST 15 - 41 U/L 48(H) - -  ALT 0 - 44 U/L 30 - -       DIAGNOSTIC IMAGING:  I have independently reviewed images of the CT scan dated 06/19/2018 and discussed with the patient.     ASSESSMENT & PLAN:   Adenocarcinoma of right lung ([place]) 1.  Stage IV adenocarcinoma of the right lung: - Initially treated with cisplatin and VP-16 for stage III disease followed by Imfinzi from 12/02/2016 through 06/30/2017. -Tecentriq every 3 weeks from 08/04/2017 through 01/02/2018. 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  Chest tube was removed last  week.  He has a follow-up with Dr. Roxan Hockey tomorrow for possible removal of VAC. - Left supraclavicular adenopathy was noted in May 2019.  Underwent needle biopsy on 05/15/2018 which shows metastatic poorly differentiated adenocarcinoma consistent with his lung primary. - Wound VAC was removed about 2 weeks ago.  His energy levels are low but stable.  We discussed the results of the CT scan of the chest dated 06/19/2018 which showed progressive left supraclavicular, axillary and subpectoral adenopathy.  Mediastinal/hilar adenopathy and upper abdominal adenopathy is relatively stable.  Progressive dense right upper lobe airspace opacification and interstitial thickening.  Persistent loculated pleural fluid collection of the right lung base which now contains gas. - His PDL 1 testing shows TPS score of 50%.  Foundation 1 results are pending at this time.  We will follow him in 1 week to check on these results and to see if he is eligible for any targeted therapy.  He has an appointment to see Dr. Roxan Hockey later today.  2.  Lower extremity swelling: This has improved slightly.  This is from underlying cirrhosis.  3.  Pulmonary embolism: He is currently on Eliquis which he is tolerating reasonably well.  He has occasional blood-tinged sputum when he coughs.  4.  Normocytic anemia: We will do further work-up including ferritin, iron panel, H41, folic acid and SPEP today.      Orders placed this encounter:  No orders of the defined types were placed in this encounter.     Derek Jack, MD [place] [phone]

## 2018-06-20 NOTE — Progress Notes (Signed)
[address]       [place] [address]             [phone]    HPI: Erik [name] returns for scheduled follow-up visit  Erik [name] is a 57 year old gentleman with stage IV lung cancer who had pneumonia complicated by empyema with a bronchopleural fistula.  I did a right VATS to drain the empyema and attempt decortication on 03/28/2018.  The lung did not fully reexpand.  He presented back in May with fever and tachycardia.  He had air-fluid levels in his chest and in the soft tissues of the chest wall.  I took him back to the OR and placed a drain in the chest and also incised and drained the chest wall and placed a VAC.  He went home with both the chest tube and was back in place.  Ultimately, we have been able to remove both of those.  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  He was treated with linezolid for that.  He had a CT of the chest yesterday which showed extensive progression of disease.  He still has air-fluid level in his right chest.  He says he has been having some low-grade temperatures but no spiking fevers and chills.  Past Medical History:  Diagnosis Date  . 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 Arthritis   . Asthma   . Chronic back pain   . COPD (chronic obstructive pulmonary disease) ([place])   . Depression   . Dyspnea    with exertion   . GERD (gastroesophageal reflux disease)   . Hypertension   . Pneumonia   . Pulmonary embolus ([place])   . Seasonal allergies   . Spinal stenosis of lumbar region   . 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 Wheezing     Current Outpatient Medications  Medication Sig Dispense Refill  . acetaminophen (TYLENOL) 325 MG tablet Take 2 tablets (650 mg total) by mouth every 6 (six) hours as needed for mild pain (or Fever >/= 101).    [name] albuterol (PROVENTIL) (2.5 MG/3ML) 0.083% nebulizer solution Take 3 mLs (2.5 mg total) by nebulization every 6 (six) hours as needed for wheezing or shortness  of breath (if unable to experience relief by using combivent inhaler). 75 mL 4  . ALPRAZolam (XANAX) 1 MG tablet Take 1 mg by mouth 2 (two) times daily as needed for anxiety or sleep (takes 1 tablet and bedtime for sleep and 1 dose during the day only if needed).     [name] amLODipine (NORVASC) 5 MG tablet     . apixaban (ELIQUIS) 5 MG TABS tablet Take 1 tablet (5 mg total) by mouth 2 (two) times daily. 60 tablet 2  . apixaban (ELIQUIS) 5 MG TABS tablet Take 1 tablet (5 mg total) by mouth 2 (two) times daily. 60 tablet 2  . Artificial Tear Solution (SOOTHE XP) SOLN Apply 2 drops to eye daily.    [name] escitalopram (LEXAPRO) 20 MG tablet     . gabapentin (NEURONTIN) 600 MG tablet Take 600 mg by mouth 2 (two) times daily.     [name] guaiFENesin (MUCINEX) 600 MG 12 hr tablet Take 600 mg by mouth 2 (two) times daily as needed (for congestion.).    [name] Ipratropium-Albuterol (COMBIVENT) 20-100 MCG/ACT AERS respimat Inhale 1 puff into the lungs every 6 (six) hours as needed for wheezing or shortness of breath. 1 Inhaler 3  .  lactulose (CHRONULAC) 10 GM/15ML solution Take 15 mLs (10 g total) by mouth 3 (three) times daily as needed for mild constipation or moderate constipation. 240 mL 0  . levalbuterol (XOPENEX) 0.63 MG/3ML nebulizer solution Take 3 mLs (0.63 mg total) by nebulization every 8 (eight) hours as needed for wheezing or shortness of breath. 3 mL 12  . loperamide (IMODIUM) 2 MG capsule Take 1 capsule (2 mg total) by mouth as needed for diarrhea or loose stools. 20 capsule 0  . methadone (DOLOPHINE) 10 MG tablet Take 20 mg by mouth 5 (five) times daily. Takes 2 tablets 5 times daily    . mirtazapine (REMERON) 15 MG tablet Take 1 tablet by mouth at bedtime.    . Misc. Devices MISC POC at 2 lpm via [place] with conserver / pulse ox on exertion. 1 each 0  . Multiple Vitamin (MULTIVITAMIN WITH MINERALS) TABS tablet Take 1 tablet by mouth daily. Centrum    . ondansetron (ZOFRAN) 8 MG tablet TAKE 1 TABLET BY MOUTH EVERY 8  HOURS AS NEEDED FOR NAUSEA AND VOMITING. 60 tablet 1  . OXYGEN Inhale 3 L into the lungs.    . pantoprazole (PROTONIX) 40 MG tablet Take 1 tablet (40 mg total) by mouth 2 (two) times daily. 60 tablet 0  . potassium chloride SA (K-DUR,KLOR-CON) 20 MEQ tablet Take 1 tablet (20 mEq total) by mouth daily. 60 tablet 1  . tiZANidine (ZANAFLEX) 4 MG tablet Take 4 mg by mouth every 8 (eight) hours as needed for muscle spasms.     . varenicline (CHANTIX) 0.5 MG tablet TAKE (2) TABLETS BY MOUTH TWICE DAILY. 112 tablet 0   No current facility-administered medications for this visit.    Facility-Administered Medications Ordered in Other Visits  Medication Dose Route Frequency Provider Last Rate Last Dose  . 0.9 %  sodium chloride infusion   Intravenous Continuous [facility] Cancer, PA-C 10 mL/hr at 01/13/17 1330      Physical Exam BP (!) 150/100   Pulse (!) 102   Temp 98.7 F (37.1 C) (Oral)   Resp 20   Ht 6' (1.829 m)   Wt 221 lb (100.2 kg)   SpO2 97% Comment: RA  BMI 29.50 kg/m  57 year old man in no acute distress Alert and oriented x3 with no focal deficits Lungs diminished on right but there is air movement.  No wheezing Cardiac regular, mildly tachycardic Incisions well-healed  Diagnostic Tests: CT CHEST, ABDOMEN, AND PELVIS WITH CONTRAST  TECHNIQUE: Multidetector CT imaging of the chest, abdomen and pelvis was performed following the standard protocol during bolus administration of intravenous contrast.  CONTRAST:  160mL ISOVUE-300 IOPAMIDOL (ISOVUE-300) INJECTION 61%  COMPARISON:  Chest CT 01/28/2018 and abdominal/pelvic CT 12/28/2017.  FINDINGS: CT CHEST FINDINGS  Cardiovascular: The heart is normal in size. Enlarging pericardial effusion. The aorta is normal in caliber. No dissection. The branch vessels are patent. The Port-A-Cath is stable.  Mediastinum/Nodes: Progressive supraclavicular, axillary and subpectoral lymphadenopathy.  13.5 mm left axillary lymph  node on image number 17 previously measured 6.5 mm.  16 mm left axillary lymph node on image number 21 previously measured 6 mm. Several other left axillary lymph nodes have enlarged.  Right axillary lymph node on image number 12 measures 10 mm and previously measured 6.5 mm.  18.5 mm prevascular lymph node on image number 27 is stable. Mild central necrosis is noted.  10.5 mm right paratracheal node on image number 14 is stable.  Subcarinal lymph nodes are stable. Progressive  left hilar adenopathy with a 12 mm lymph node on image number 29 previously measuring 5 mm.  Diffuse upper esophageal wall thickening may be due to radiation change.  Lungs/Pleura: Extensive and progressive dense right apical airspace opacification which could reflect tumor and interstitial spread of disease. It is difficult to see a discrete measurable mass but the soft tissue in the left hilar region posteriorly on image number 30 of series 2 appears more solid and no longer demonstrates air bronchograms. This could be tumor.  There is a loculated right pleural fluid collection which contains gas. The pleura demonstrates irregular enhancement and although this could represent a recurrent empyema a bronchopleural fistula is possible also.  The left lung demonstrates emphysematous changes and bronchial wall thickening. No infiltrate or obvious metastatic pulmonary nodules.  Musculoskeletal: No significant bony findings. No lytic or sclerotic bone lesions to suggest metastatic disease.  CT ABDOMEN PELVIS FINDINGS  Hepatobiliary: Again demonstrated are cirrhotic changes involving the liver with right hepatic lobe atrophy, irregular liver contour and prominent hepatic fissures. No focal hepatic lesions to suggest metastatic disease. Gallbladder appears normal. No intra or extrahepatic biliary dilatation.  Pancreas: No mass, inflammation or ductal dilatation. Prominent fatty  interstices.  Spleen: Splenomegaly but no splenic masses.  Adrenals/Urinary Tract: The adrenal glands and kidneys are unremarkable. The bladder appears normal.  Stomach/Bowel: The stomach, duodenum, small bowel and colon are unremarkable. No acute inflammatory changes, mass lesions or obstructive findings.  Vascular/Lymphatic: Minimal scattered atherosclerotic calcifications involving the iliac arteries. The aortic branch vessels are patent. The major venous structures are patent.  14.5 mm celiac axis lymph node on image number 65 previously measured 11.5 mm.  15 mm periportal lymph node on image number 66 is stable.  14 mm inter aortocaval lymph node on image number 70 is stable.  Reproductive: The prostate gland and seminal vesicles are unremarkable.  Other: No pelvic mass or adenopathy. Small scattered lymph nodes are stable. No inguinal mass or adenopathy.  Musculoskeletal: Stable surgical changes involving the lumbar spine. No worrisome bone lesions.  IMPRESSION: 1. Progressive supraclavicular, axillary and subpectoral lymphadenopathy. 2. The mediastinal/hilar adenopathy and upper abdominal adenopathy appears relatively stable. 3. Progressive dense right upper lobe airspace opacification and interstitial thickening. Could not exclude progressive tumor. PET-CT may be helpful for further evaluation. 4. Persistent loculated pleural fluid collection at the right lung base which now contains gas. Findings could be due to persistent/recurrent empyema or bronchopleural fistula. 5. Enlarging pericardial effusion. 6. Stable cirrhotic changes involving the liver without worrisome hepatic lesions. Associated splenomegaly but no ascites. 7. Stable abdominal lymphadenopathy. No new or progressive findings in the abdomen/pelvis.   Electronically Signed   By: Marijo Sanes M.D.   On: 06/19/2018 14:43 I personally reviewed the CT images and concur with the  findings noted above.  Impression: Erik [name] is a 57 year old gentleman with metastatic lung cancer who had a pneumonia complicated by empyema and bronchopleural fistula back in April.  He then presented back with recurrent infection in the chest and a bronchopleural fistula into the soft tissues of the chest wall.  That required a second operation with placement of the chest tube and a VAC for the soft tissue.  No attempt was made to decorticate the lung at the second operation.  Unfortunately, he has had severe progression of disease with massive left supraclavicular adenopathy and progression elsewhere as well.  Foundation 1 results are pending so treatment is on hold pending those results.  Prognosis appears to be  poor  Regarding the complex pleural space.  He does have fluid and air in there and always will.  I do not think there is any indication to drain the chest at this point.  If he were to start spiking temps he would need drainage.  Plan: Follow-up with Dr. Delton Coombes.  I will see him back in 6 weeks to check on his progress.  We will do a plain chest x-ray at that time  [name], MD [facility] [phone]

## 2018-06-20 NOTE — Assessment & Plan Note (Signed)
1.  Stage IV adenocarcinoma of the right lung: - Initially treated with cisplatin and VP-16 for stage III disease followed by Imfinzi from 12/02/2016 through 06/30/2017. -Tecentriq every 3 weeks from 08/04/2017 through 01/02/2018. - He developed empyema of the right pleural space from a bronchopleural fistula, status post VATS decortication on 2/67/1245, complicated by wound infection with VRE and VAC placement.  Chest tube was removed last week.  He has a follow-up with Dr. Roxan Hockey tomorrow for possible removal of VAC. - Left supraclavicular adenopathy was noted in May 2019.  Underwent needle biopsy on 05/15/2018 which shows metastatic poorly differentiated adenocarcinoma consistent with his lung primary. - Wound VAC was removed about 2 weeks ago.  His energy levels are low but stable.  We discussed the results of the CT scan of the chest dated 06/19/2018 which showed progressive left supraclavicular, axillary and subpectoral adenopathy.  Mediastinal/hilar adenopathy and upper abdominal adenopathy is relatively stable.  Progressive dense right upper lobe airspace opacification and interstitial thickening.  Persistent loculated pleural fluid collection of the right lung base which now contains gas. - His PDL 1 testing shows TPS score of 50%.  Foundation 1 results are pending at this time.  We will follow him in 1 week to check on these results and to see if he is eligible for any targeted therapy.  He has an appointment to see Dr. Roxan Hockey later today.  2.  Lower extremity swelling: This has improved slightly.  This is from underlying cirrhosis.  3.  Pulmonary embolism: He is currently on Eliquis which he is tolerating reasonably well.  He has occasional blood-tinged sputum when he coughs.  4.  Normocytic anemia: We will do further work-up including ferritin, iron panel, Y09, folic acid and SPEP today.

## 2018-06-27 ENCOUNTER — Inpatient Hospital Stay (HOSPITAL_BASED_OUTPATIENT_CLINIC_OR_DEPARTMENT_OTHER): Payer: Medicare Other | Admitting: Hematology

## 2018-06-27 ENCOUNTER — Other Ambulatory Visit: Payer: Self-pay

## 2018-06-27 ENCOUNTER — Encounter (HOSPITAL_COMMUNITY): Payer: Self-pay | Admitting: Hematology

## 2018-06-27 VITALS — BP 119/82 | HR 121 | Temp 98.9°F | Resp 18 | Wt 223.3 lb

## 2018-06-27 DIAGNOSIS — I2699 Other pulmonary embolism without acute cor pulmonale: Secondary | ICD-10-CM

## 2018-06-27 DIAGNOSIS — C3491 Malignant neoplasm of unspecified part of right bronchus or lung: Secondary | ICD-10-CM

## 2018-06-27 DIAGNOSIS — Z79899 Other long term (current) drug therapy: Secondary | ICD-10-CM

## 2018-06-27 DIAGNOSIS — D649 Anemia, unspecified: Secondary | ICD-10-CM | POA: Diagnosis not present

## 2018-06-27 DIAGNOSIS — Z5111 Encounter for antineoplastic chemotherapy: Secondary | ICD-10-CM | POA: Diagnosis not present

## 2018-06-27 DIAGNOSIS — M7989 Other specified soft tissue disorders: Secondary | ICD-10-CM

## 2018-06-27 DIAGNOSIS — Z923 Personal history of irradiation: Secondary | ICD-10-CM | POA: Diagnosis not present

## 2018-06-27 DIAGNOSIS — C3431 Malignant neoplasm of lower lobe, right bronchus or lung: Secondary | ICD-10-CM | POA: Diagnosis not present

## 2018-06-27 DIAGNOSIS — C770 Secondary and unspecified malignant neoplasm of lymph nodes of head, face and neck: Secondary | ICD-10-CM | POA: Diagnosis not present

## 2018-06-27 DIAGNOSIS — K7460 Unspecified cirrhosis of liver: Secondary | ICD-10-CM

## 2018-06-27 DIAGNOSIS — Z7901 Long term (current) use of anticoagulants: Secondary | ICD-10-CM

## 2018-06-27 MED ORDER — CYANOCOBALAMIN 1000 MCG/ML IJ SOLN
INTRAMUSCULAR | Status: AC
Start: 2018-06-27 — End: ?
  Filled 2018-06-27: qty 1

## 2018-06-27 MED ORDER — CYANOCOBALAMIN 1000 MCG/ML IJ SOLN
1000.0000 ug | Freq: Once | INTRAMUSCULAR | Status: AC
  Administered 2018-06-27: 1000 ug via INTRAMUSCULAR

## 2018-06-27 MED ORDER — FOLIC ACID 1 MG PO TABS: 1.0000 mg | ORAL_TABLET | Freq: Every day | ORAL | 2 refills | Status: AC

## 2018-06-27 NOTE — Patient Instructions (Addendum)
[place] at [facility] Discharge Instructions  Plan to start chemo back next week. Labs before. You will be scheduled for an ECHO of your heart. Then follow up appointment on your second round of chemo.   Today you saw Dr. Raliegh Ip. You received a B12 injection.   Thank you for choosing [place] at [facility] to provide your oncology and hematology care.  To afford each patient quality time with our provider, please arrive at least 15 minutes before your scheduled appointment time.   If you have a lab appointment with the [facility] please come in thru the  Main Entrance and check in at the main information desk  You need to re-schedule your appointment should you arrive 10 or more minutes late.  We strive to give you quality time with our providers, and arriving late affects you and other patients whose appointments are after yours.  Also, if you no show three or more times for appointments you may be dismissed from the clinic at the providers discretion.     Again, thank you for choosing [facility].  Our hope is that these requests will decrease the amount of time that you wait before being seen by our physicians.       _____________________________________________________________  Should you have questions after your visit to [facility], please contact our office at [phone] between the hours of 8:30 a.m. and 4:30 p.m.  Voicemails left after 4:30 p.m. will not be returned until the following business day.  For prescription refill requests, have your pharmacy contact our office.       Resources For Cancer Patients and their Caregivers ? American Cancer Society: Can assist with transportation, wigs, general needs, runs Look Good Feel Better.        701-252-3035 ? Cancer Care: Provides financial assistance, online support groups, medication/co-pay assistance.  1-800-813-HOPE 807 272 9763) ? [place] Assists [place] cancer patients and their families through emotional , educational and financial support.  201-232-5503 ? [place] DSS Where to apply for food stamps, Medicaid and utility assistance. 6305470790 ? [facility]: Transportation to medical appointments. 7090262184 ? Social Security Administration: May apply for disability if have a Stage IV cancer. 450-368-2501 952-660-8899 ? [employer], Disability and Transit Services: Assists with nutrition, care and transit needs. [place] Support Programs:   > Cancer Support Group  2nd Tuesday of the month 1pm-2pm, Journey Room   > Creative Journey  3rd Tuesday of the month 1130am-1pm, Journey Room

## 2018-06-27 NOTE — Assessment & Plan Note (Signed)
1.  Stage IV adenocarcinoma of the right lung: - Initially treated with cisplatin and VP-16 for stage III disease followed by Imfinzi from 12/02/2016 through 06/30/2017.  He reportedly had progression during durvalumab treatment. -Tecentriq every 3 weeks from 08/04/2017 through 02/16/2018. - He developed empyema of the right pleural space from a bronchopleural fistula, status post VATS decortication on 06/05/4102, complicated by wound infection with VRE and VAC placement.  Chest tube was removed last week.  He has a follow-up with Dr. Roxan Hockey tomorrow for possible removal of VAC. - Left supraclavicular adenopathy was noted in May 2019.  Underwent needle biopsy on 05/15/2018 which shows metastatic poorly differentiated adenocarcinoma consistent with his lung primary. - Wound VAC was removed about 2 weeks ago.  His energy levels are low but stable.  We discussed the results of the CT scan of the chest dated 06/19/2018 which showed progressive left supraclavicular, axillary and subpectoral adenopathy.  Mediastinal/hilar adenopathy and upper abdominal adenopathy is relatively stable.  Progressive dense right upper lobe airspace opacification and interstitial thickening.  Persistent loculated pleural fluid collection of the right lung base which now contains gas. - His PDL 1 testing shows TPS score of 50%.  Condition 1 report shows MS-stable, TMB-intermediate, MEK 1 and other mutations.  None of the mutations have any targetable treatments.  Hence I have recommended chemotherapy with carboplatin and pemetrexed.  Since he has already received immunotherapy, I do not know if adding pembrolizumab to this will give any more benefit.  We might consider adding bevacizumab based on how he does.  We talked about the side effects of chemotherapy regimen including but not limited to allergic reactions, cytopenias, life-threatening infections, renal insufficiency, peripheral neuropathy among others.  He will be started on  vitamin B12 injection today.  We prescribed folic acid to be taken 1 mg daily.  We will tentatively start him on his treatment next week.  We will also plan to give him growth factor support with Neulasta because of his previous history of empyema and its complications.  2.  Lower extremity swelling: This has improved slightly. This is from underlying cirrhosis.  3.  Pulmonary embolism: He is currently on Eliquis which he is tolerating reasonably well.  He has occasional blood-tinged sputum when he coughs.  4.  Normocytic anemia: We will do further work-up including ferritin, iron panel, U13, folic acid and SPEP today.

## 2018-06-27 NOTE — Progress Notes (Signed)
Erik [name] tolerated B12 injection well without incident or complaint. Reminded to pick up folic acid at pharmacy. Discharged self ambulatory in satisfactory condition with wife.

## 2018-06-27 NOTE — Progress Notes (Signed)
[address], [place] [address]   CLINIC:  Medical Oncology/Hematology  PCP:  [name], [address] [place] [address] [phone]   REASON FOR VISIT:  Follow-up for metastatic lung cancer stage IV adenocarcinoma.  CURRENT THERAPY: plan to start carboplatin and pemetrexed  BRIEF ONCOLOGIC HISTORY:    Adenocarcinoma of right lung ([place])   07/01/2016 PET scan    6.3 x 3.7 cm right lower lobe mass, suspicious for primary bronchogenic neoplasm, as described above. Hypermetabolic thoracic nodal metastases, as above. Additional right perihilar hypermetabolism, indeterminate. Associated right middle lobe atelectasis/collapse.      07/19/2016 Procedure    Bronchoscopy with brushings and biopsies and endobronchial ultrasound with mediastinal lymph node aspirations by Dr. Roxan Hockey      07/21/2016 Pathology Results    Lung, biopsy, Right Middle Lobe - LUNG TISSUE WITH SQUAMOUS METAPLASIA. - NO MALIGNANCY IDENTIFIED.      07/21/2016 Pathology Results    FINE NEEDLE ASPIRATION, ENDOSCOPIC (A) LEVEL 7 (SPECIMEN 1 OF 3, COLLECTED ON 07/19/16): MALIGNANT CELLS CONSISTENT WITH ADENOCARCINOMA.      07/21/2016 Pathology Results    FINE NEEDLE ASPIRATION, EBUS, 4R, B (SPECIMEN 2 OF 3, COLLECTED 07/19/16): MALIGNANT CELLS CONSISTENT WITH ADENOCARCINOMA.      07/21/2016 Pathology Results    FINE NEEDLE ASPIRATION, EBUS, BRUSHING, RIGHT MIDDLE LOBE, D (SPECIMEN 3 OF 3, COLLECTED 07/19/16): MALIGNANT CELLS CONSISTENT WITH ADENOCARCINOMA.      07/22/2016 Imaging    MRI brain- No evidence of intracranial metastases.      08/17/2016 - 09/20/2016 Chemotherapy    The patient had palonosetron (ALOXI) injection 0.25 mg, 0.25 mg, Intravenous,  Once, 1 of 1 cycle  CISplatin (PLATINOL) 123 mg in sodium chloride 0.9 % 500 mL chemo infusion, 50 mg/m2 = 123 mg, Intravenous,  Once, 1 of 1 cycle  etoposide (VEPESID) 120 mg in sodium chloride 0.9  % 500 mL chemo infusion, 50 mg/m2 = 120 mg, Intravenous,  Once, 1 of 1 cycle  fosaprepitant (EMEND) 150 mg, dexamethasone (DECADRON) 12 mg in sodium chloride 0.9 % 145 mL IVPB, , Intravenous,  Once, 1 of 1 cycle  ondansetron (ZOFRAN) 8 mg in sodium chloride 0.9 % 50 mL IVPB, , Intravenous,  Once, 2 of 6 cycles  for chemotherapy treatment.        08/17/2016 -  Radiation Therapy         11/15/2016 Imaging    CT CAP- Interval response to therapy. The previously demonstrated mediastinal and right hilar adenopathy and resulting right middle lobe atelectasis have all improved. 2. No discrete residual lung masses are identified. There is new multifocal ground-glass opacity within the right lower lobe, and to a lesser extent in the left upper lobe. These are probably inflammatory/treatment related. 3. No evidence of abdominopelvic metastatic disease. Stable prominent lymph nodes in the upper abdomen, likely reactive. 4. Decompressed mid SVC without specific signs of SVC occlusion.      12/02/2016 -  Chemotherapy    Imfinzi (durvalumab) immunotherapy every 2 weeks x up to 1 year      01/03/2017 Procedure    EGD by Dr. [name], colonoscopy aborted as "patient forgot to take other half of preparation). Esophagitis. likely radiation-induced ?Dilated.  Erythematous mucosa in the stomach. Biopsied. Normal duodenal bulb and second portion of the duodenum.      02/14/2017 Imaging    CT chest- 1. Development of a small to moderate right-sided pleural effusion with minimal loculation  anteriorly. 2. Worsened right-sided aeration with right middle and lower lobe consolidation. As this has a geographic distribution, this could be radiation induced or represent infection. Depending on clinical concern of progressive disease, thoracentesis and/or PET may be informative. 3. Development of mild right paratracheal adenopathy, most likely reactive. Recommend attention on follow-up. 4. Subtle findings  which are highly suspicious for mild cirrhosis. Upper abdominal adenopathy is similar and likely reactive. 5. Persistent right lower and improved left upper lobe ground-glass opacities are favored to be infectious or inflammatory.      06/01/2017 Imaging    CT chest  IMPRESSION: 1. Evolving radiation changes in the medial right hemithorax. 2. Stable moderate size right pleural effusion without definite nodular components. 3. No signs of local recurrence or progressive metastatic disease. No residual enlarged thoracic lymph nodes. 4. Stable appearance of the visualized upper abdomen with probable cirrhosis and reactive adenopathy in the porta hepatis.      07/15/2017 Procedure    Therapeutic thoracentesis performed today; 1.3 L removed.  Fluid sent for cytology.       07/15/2017 Pathology Results    Pleural fluid NEGATIVE for malignancy by cytology.       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 Paramediastinal radiation change and loculated right pleural effusion appear unchanged from 07/14/2017. 2. Borderline prevascular lymph node within the anterior mediastinum is mildly increased in size from previous exam. 3. There is a new 5 mm lung nodule within the left lower lobe. Nonspecific in appearance. Attention on follow-up imaging advise. Other small nonspecific nodules in the left lung are stable. 4. Morphologic features of the liver compatible with cirrhosis. Enlarged upper abdominal lymph nodes are nonspecific and likely reactive. 5. Stable indeterminate low-attenuation focus in the posterior right liver dome. Previously characterized on MRI from 07/29/2017 as reflecting post treatment changes from external beam radiation to the lung. 6.  Emphysema (ICD10-J43.9).      07/01/2018 -  Chemotherapy    The patient had PALONOSETRON HCL INJECTION 0.25 MG/5ML, 0.25 mg, Intravenous,   Once, 0 of 6 cycles PEMEtrexed (ALIMTA) 1,125 mg in sodium chloride 0.9 % 100 mL chemo infusion, 500 mg/m2, Intravenous,  Once, 0 of 6 cycles CARBOplatin (PARAPLATIN) in sodium chloride 0.9 % 100 mL chemo infusion, , Intravenous,  Once, 0 of 6 cycles  for chemotherapy treatment.         CANCER STAGING: Cancer Staging Adenocarcinoma of right lung [facility]) Staging form: Lung, AJCC 7th Edition - Clinical stage from 07/23/2016: Stage IIIA (T2b, N2, M0) - Signed by Baird Cancer, PA-C on 07/23/2016 - Clinical stage from 07/28/2017: Stage IV (M1a) - Signed by Holley Bouche, NP on 07/28/2017    INTERVAL HISTORY:  Mr. Baetz 57 y.o. male returns for routine follow-up for metastatic lung cancer. Patient is here today with his wife. She is on continuous home 02. He is SOB with exertion. He is very fatigued throughout the day. He can perform all his own ADLs but needs to rest often. His feet swell and he takes lasix as needed. Denies any chest pains. Denies any nausea, vomiting, or diarrhea. Denies any cough or hemoptysis. His energy levels and appetite are at 25%. He is going to start drink 1 can of ensure daily to help maintain his weight during chemo.    REVIEW OF SYSTEMS:  Review of Systems  Constitutional: Positive for fatigue.  HENT:  Negative.   Eyes: Negative.   Respiratory: Positive for shortness of breath.   Cardiovascular: Positive for leg swelling.  Gastrointestinal: Negative.   Endocrine: Negative.   Genitourinary: Negative.    Musculoskeletal: Negative.   Skin: Negative.   Neurological: Negative.   Hematological: Negative.   Psychiatric/Behavioral: Negative.      PAST MEDICAL/SURGICAL HISTORY:  Past Medical History:  Diagnosis Date  . Arnold-Chiari syndrome ([place])   . Arthritis   . Asthma   . Chronic back pain   . COPD (chronic obstructive pulmonary disease) ([place])   . Depression   . Dyspnea    with exertion   . GERD (gastroesophageal reflux disease)   .  Hypertension   . Pneumonia   . Pulmonary embolus ([place])   . Seasonal allergies   . Spinal stenosis of lumbar region   . 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 Wheezing    Past Surgical History:  Procedure Laterality Date  . APPLICATION OF WOUND VAC  04/19/2018   Procedure: APPLICATION OF WOUND VAC;  Surgeon: [name], MD;  Location: [place];  Service: Vascular;;  . APPLICATION OF WOUND VAC Right 04/21/2018   Procedure: WOUND VAC CHANGE;  Surgeon: [name], MD;  Location: [facility];  Service: Thoracic;  Laterality: Right;  . APPLICATION OF WOUND VAC N/A 04/24/2018   Procedure: WOUND VAC CHANGE;  Surgeon: [name], MD;  Location: [place];  Service: Thoracic;  Laterality: N/A;  . BACK SURGERY     5 total  . BIOPSY  01/03/2017   Procedure: BIOPSY;  Surgeon: Daneil Dolin, MD;  [facility];  Service: Endoscopy;;  gastric  . CHEST TUBE INSERTION Right 04/19/2018   Procedure: CHEST TUBE INSERTION;  Surgeon: [name], MD;  Location: [place];  Service: Vascular;  Laterality: Right;  . CHEST TUBE INSERTION Right 04/19/2018   Procedure: CHEST TUBE INSERTION;  Surgeon: [name], MD;  Location: [facility];  Service: Thoracic;  Laterality: Right;  . COLONOSCOPY WITH PROPOFOL N/A 04/04/2017   Procedure: COLONOSCOPY WITH PROPOFOL;  Surgeon: Daneil Dolin, MD;  [facility];  Service: Endoscopy;  Laterality: N/A;  8:45am  . DECORTICATION Right 03/28/2018   Procedure: DECORTICATION;  Surgeon: [name], MD;  Location: [facility];  Service: Thoracic;  Laterality: Right;  . ESOPHAGOGASTRODUODENOSCOPY (EGD) WITH PROPOFOL N/A 01/03/2017   Procedure: ESOPHAGOGASTRODUODENOSCOPY (EGD) WITH PROPOFOL;  Surgeon: Daneil Dolin, MD;  [facility];  Service: Endoscopy;  Laterality: N/A;  . I&D EXTREMITY Right 04/19/2018   Procedure: IRRIGATION AND DEBRIDEMENT RIGHT CHEST WOUND;  Surgeon: [name], MD;   Location: [place];  Service: Vascular;  Laterality: Right;  . MALONEY DILATION N/A 01/03/2017   Procedure: Venia Minks DILATION;  Surgeon: Daneil Dolin, MD;  [facility];  Service: Endoscopy;  Laterality: N/A;  . MULTIPLE EXTRACTIONS WITH ALVEOLOPLASTY N/A 07/08/2014   Procedure: MULTIPLE EXTRACION WITH ALVEOLOPLASTY with EXCISION LESION RIGHT SIDE OF TONGUE;  Surgeon: Gae Bon, DDS;  Location: [place];  Service: Oral Surgery;  Laterality: N/A;  . PORTACATH PLACEMENT Right 07/30/2016   Procedure: INSERTION PORT-A-CATH;  Surgeon: Vickie Epley, MD;  [facility];  Service: Vascular;  Laterality: Right;  . THORACOTOMY  04/19/2018   Procedure: THORACOTOMY MAJOR;  Surgeon: [name], MD;  Location: [place];  Service: Vascular;;  . VIDEO ASSISTED THORACOSCOPY Right 04/21/2018   Procedure: VIDEO ASSISTED  THORACOSCOPY;  Surgeon: [name], MD;  Location: [place];  Service: Thoracic;  Laterality: Right;  [name] VIDEO ASSISTED THORACOSCOPY Right 04/24/2018   Procedure: VIDEO ASSISTED THORACOSCOPY;  Surgeon: [name], MD;  Location: [place];  Service: Thoracic;  Laterality: Right;  [name] VIDEO ASSISTED THORACOSCOPY (VATS)/EMPYEMA Right 03/28/2018   Procedure: VIDEO ASSISTED THORACOSCOPY (VATS)/EMPYEMA;  Surgeon: [name], MD;  Location: [place];  Service: Thoracic;  Laterality: Right;  [name] VIDEO BRONCHOSCOPY WITH ENDOBRONCHIAL ULTRASOUND N/A 07/19/2016   Procedure: VIDEO BRONCHOSCOPY WITH ENDOBRONCHIAL ULTRASOUND;  Surgeon: [name], MD;  Location: [place];  Service: Thoracic;  Laterality: N/A;     SOCIAL HISTORY:  Social History   Socioeconomic History  . Marital status: Divorced    Spouse name: Not on file  . Number of children: Not on file  . Years of education: Not on file  . Highest education level: Not on file  Occupational History  . Not on file  Social Needs  . Financial resource strain: Not on file  . Food insecurity:    Worry: Not  on file    Inability: Not on file  . Transportation needs:    Medical: Not on file    Non-medical: Not on file  Tobacco Use  . Smoking status: Former Smoker    Packs/day: 0.25    Years: 38.00    Pack years: 9.50    Types: Cigarettes    Last attempt to quit: 02/16/2018    Years since quitting: 0.3  . Smokeless tobacco: Former Systems developer    Types: Chew  . Tobacco comment: [place] STAY  Substance and Sexual Activity  . Alcohol use: Yes    Alcohol/week: 1.2 oz    Types: 2 Cans of beer per week    Comment: reports drinking 0-2 beers a week  . Drug use: Yes    Frequency: 7.0 times per week    Types: Marijuana    Comment: daily for cancer; pt denies any use at present time 08/23/17  . Sexual activity: Not Currently  Lifestyle  . Physical activity:    Days per week: Not on file    Minutes per session: Not on file  . Stress: Not on file  Relationships  . Social connections:    Talks on phone: Not on file    Gets together: Not on file    Attends religious service: Not on file    Active member of club or organization: Not on file    Attends meetings of clubs or organizations: Not on file    Relationship status: Not on file  . Intimate partner violence:    Fear of current or ex partner: Not on file    Emotionally abused: Not on file    Physically abused: Not on file    Forced sexual activity: Not on file  Other Topics Concern  . Not on file  Social History Narrative  . Not on file    FAMILY HISTORY:  Family History  Problem Relation Age of Onset  . Cancer Mother        breast cancer  . Heart failure Brother   . Colon cancer Neg Hx        not sure, ?grandfather and/or uncle    CURRENT MEDICATIONS:  Outpatient Encounter Medications as of 06/27/2018  Medication Sig Note  . acetaminophen (TYLENOL) 325 MG tablet Take 2 tablets (650 mg total) by mouth every 6 (six) hours as needed for mild  pain (or Fever >/= 101).   [name] albuterol (PROVENTIL) (2.5 MG/3ML) 0.083%  nebulizer solution Take 3 mLs (2.5 mg total) by nebulization every 6 (six) hours as needed for wheezing or shortness of breath (if unable to experience relief by using combivent inhaler).   . ALPRAZolam (XANAX) 1 MG tablet Take 1 mg by mouth 2 (two) times daily as needed for anxiety or sleep (takes 1 tablet and bedtime for sleep and 1 dose during the day only if needed).    [name] amLODipine (NORVASC) 5 MG tablet    . apixaban (ELIQUIS) 5 MG TABS tablet Take 1 tablet (5 mg total) by mouth 2 (two) times daily.   [name] apixaban (ELIQUIS) 5 MG TABS tablet Take 1 tablet (5 mg total) by mouth 2 (two) times daily.   . Artificial Tear Solution (SOOTHE XP) SOLN Apply 2 drops to eye daily.   [name] escitalopram (LEXAPRO) 20 MG tablet    . gabapentin (NEURONTIN) 600 MG tablet Take 600 mg by mouth 2 (two) times daily.    [name] guaiFENesin (MUCINEX) 600 MG 12 hr tablet Take 600 mg by mouth 2 (two) times daily as needed (for congestion.).   [name] Ipratropium-Albuterol (COMBIVENT) 20-100 MCG/ACT AERS respimat Inhale 1 puff into the lungs every 6 (six) hours as needed for wheezing or shortness of breath. 03/23/2018: Needs refill-wasted medication learning how to use device  . lactulose (CHRONULAC) 10 GM/15ML solution Take 15 mLs (10 g total) by mouth 3 (three) times daily as needed for mild constipation or moderate constipation.   [name] levalbuterol (XOPENEX) 0.63 MG/3ML nebulizer solution Take 3 mLs (0.63 mg total) by nebulization every 8 (eight) hours as needed for wheezing or shortness of breath.   . loperamide (IMODIUM) 2 MG capsule Take 1 capsule (2 mg total) by mouth as needed for diarrhea or loose stools.   . methadone (DOLOPHINE) 10 MG tablet Take 20 mg by mouth 5 (five) times daily. Takes 2 tablets 5 times daily 04/20/2018: LF on 03-29-18 #320 32 Day supply  . mirtazapine (REMERON) 15 MG tablet Take 1 tablet by mouth at bedtime.   . Misc. Devices MISC POC at 2 lpm via  with conserver / pulse ox on exertion.   . Multiple Vitamin  (MULTIVITAMIN WITH MINERALS) TABS tablet Take 1 tablet by mouth daily. Centrum   . ondansetron (ZOFRAN) 8 MG tablet TAKE 1 TABLET BY MOUTH EVERY 8 HOURS AS NEEDED FOR NAUSEA AND VOMITING.   . OXYGEN Inhale 3 L into the lungs.   . pantoprazole (PROTONIX) 40 MG tablet Take 1 tablet (40 mg total) by mouth 2 (two) times daily.   . potassium chloride SA (K-DUR,KLOR-CON) 20 MEQ tablet Take 1 tablet (20 mEq total) by mouth daily.   [name] tiZANidine (ZANAFLEX) 4 MG tablet Take 4 mg by mouth every 8 (eight) hours as needed for muscle spasms.    . varenicline (CHANTIX) 0.5 MG tablet TAKE (2) TABLETS BY MOUTH TWICE DAILY.   . folic acid (FOLVITE) 1 MG tablet Take 1 tablet (1 mg total) by mouth daily.    Facility-Administered Encounter Medications as of 06/27/2018  Medication  . 0.9 %  sodium chloride infusion  . [COMPLETED] cyanocobalamin ((VITAMIN B-12)) injection 1,000 mcg    ALLERGIES:  Allergies  Allergen Reactions  . Demeclocycline Swelling    SWELLING REACTION UNSPECIFIED   . Tetracyclines & Related Swelling    SWELLING REACTION UNSPECIFIED      PHYSICAL EXAM:  ECOG Performance status: 1 Left lung clear  to auscultation, right lung has decreased breath sounds. CVS: tachycardia  Vitals:   06/27/18 0843  BP: 119/82  Pulse: (!) 121  Resp: 18  Temp: 98.9 F (37.2 C)  SpO2: 95%   Filed Weights   06/27/18 0843  Weight: 223 lb 4.8 oz (101.3 kg)    Physical Exam   LABORATORY DATA:  I have reviewed the labs as listed.  CBC    Component Value Date/Time   WBC 9.2 05/30/2018 1032   RBC 3.51 (L) 05/30/2018 1032   RBC 3.51 (L) 05/30/2018 1032   HGB 10.6 (L) 05/30/2018 1032   HCT 33.8 (L) 05/30/2018 1032   PLT 245 05/30/2018 1032   MCV 96.3 05/30/2018 1032   MCH 30.2 05/30/2018 1032   MCHC 31.4 05/30/2018 1032   RDW 19.1 (H) 05/30/2018 1032   LYMPHSABS 1.2 05/30/2018 1032   MONOABS 0.7 05/30/2018 1032   EOSABS 0.1 05/30/2018 1032   BASOSABS 0.0 05/30/2018 1032   CMP Latest  Ref Rng & Units 05/30/2018 04/26/2018 04/23/2018  Glucose 70 - 99 mg/dL 141(H) 89 97  BUN 6 - 20 mg/dL 6 12 11   Creatinine 0.61 - 1.24 mg/dL 0.86 1.34(H) 1.53(H)  Sodium 135 - 145 mmol/L 137 146(H) 141  Potassium 3.5 - 5.1 mmol/L 3.7 3.9 3.5  Chloride 98 - 111 mmol/L 104 107 103  CO2 22 - 32 mmol/L 25 31 30   Calcium 8.9 - 10.3 mg/dL 8.0(L) 8.3(L) 7.8(L)  Total Protein 6.5 - 8.1 g/dL 7.7 - -  Total Bilirubin 0.3 - 1.2 mg/dL 0.5 - -  Alkaline Phos 38 - 126 U/L 249(H) - -  AST 15 - 41 U/L 48(H) - -  ALT 0 - 44 U/L 30 - -            ASSESSMENT & PLAN:   Adenocarcinoma of right lung ([facility]) 1.  Stage IV adenocarcinoma of the right lung: - Initially treated with cisplatin and VP-16 for stage III disease followed by Imfinzi from 12/02/2016 through 06/30/2017.  He reportedly had progression during durvalumab treatment. -Tecentriq every 3 weeks from 08/04/2017 through 02/16/2018. 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  Chest tube was removed last week.  He has a follow-up with Dr. Roxan Hockey tomorrow for possible removal of VAC. - Left supraclavicular adenopathy was noted in May 2019.  Underwent needle biopsy on 05/15/2018 which shows metastatic poorly differentiated adenocarcinoma consistent with his lung primary. - Wound VAC was removed about 2 weeks ago.  His energy levels are low but stable.  We discussed the results of the CT scan of the chest dated 06/19/2018 which showed progressive left supraclavicular, axillary and subpectoral adenopathy.  Mediastinal/hilar adenopathy and upper abdominal adenopathy is relatively stable.  Progressive dense right upper lobe airspace opacification and interstitial thickening.  Persistent loculated pleural fluid collection of the right lung base which now contains gas. - His PDL 1 testing shows TPS score of 50%.   Condition 1 report shows MS-stable, TMB-intermediate, MEK 1 and other mutations.  None of the mutations have any targetable treatments.  Hence I have recommended chemotherapy with carboplatin and pemetrexed.  Since he has already received immunotherapy, I do not know if adding pembrolizumab to this will give any more benefit.  We might consider adding bevacizumab based on how he does.  We talked about the side effects of chemotherapy regimen including but not limited  to allergic reactions, cytopenias, life-threatening infections, renal insufficiency, peripheral neuropathy among others.  He will be started on vitamin B12 injection today.  We prescribed folic acid to be taken 1 mg daily.  We will tentatively start him on his treatment next week.  We will also plan to give him growth factor support with Neulasta because of his previous history of empyema and its complications.  2.  Lower extremity swelling: This has improved slightly. This is from underlying cirrhosis.  3.  Pulmonary embolism: He is currently on Eliquis which he is tolerating reasonably well.  He has occasional blood-tinged sputum when he coughs.  4.  Normocytic anemia: We will do further work-up including ferritin, iron panel, Q01, folic acid and SPEP today.  Total time spent is 40 minutes, with more than 50% of time spent face to face discussing treatment options, plan, side effects, and coordination of care.    Orders placed this encounter:  Orders Placed This Encounter  Procedures  . Magnesium  . CBC with Differential/Platelet  . Comprehensive metabolic panel  . CBC with Differential/Platelet  . Comprehensive metabolic panel  . ECHOCARDIOGRAM COMPLETE      Derek Jack, [place] [phone]

## 2018-06-28 ENCOUNTER — Other Ambulatory Visit (HOSPITAL_COMMUNITY): Payer: Self-pay | Admitting: *Deleted

## 2018-06-28 MED ORDER — ALBUTEROL SULFATE HFA 108 (90 BASE) MCG/ACT IN AERS: 2.0000 | INHALATION_SPRAY | Freq: Four times a day (QID) | RESPIRATORY_TRACT | 2 refills | Status: DC | PRN

## 2018-06-28 MED ORDER — PROCHLORPERAZINE MALEATE 10 MG PO TABS: 10.0000 mg | ORAL_TABLET | Freq: Four times a day (QID) | ORAL | 1 refills | Status: DC | PRN

## 2018-06-28 NOTE — Patient Instructions (Signed)
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  You are going to be treated with palliative intent.  This means your cancer is not curable but treatable.  You will be treated with Carboplatin (paraplatin) and pemetrexed (alimta) every 21 days.  You will see the doctor regularly throughout treatment.  We monitor your lab work prior to every treatment. The doctor monitors your response to treatment by the way you are feeling, your blood work, and scans periodically.  There will be wait times while you are here for treatment.  It will take about 30 minutes to 1 hour for your lab work to result.  Then there will be wait times while pharmacy mixes your medications.    You must take folic acid every day by mouth beginning 7 days before your first dose of alimta.  You must keep taking folic acid every day during the time you are being treated with alimta, and for every day for 21 days after you receive your last alimta dose.    You will get B12 injections while you are on alimta.  The first one was given on 7/23 prior to starting treatment and then about every 9 weeks during treatment.      You will receive the following pre-medications prior to receiving chemotherapy:  Aloxi - high powered nausea/vomiting prevention medication used for chemotherapy patients.   Dexamethasone - steroid - given to reduce the risk of you having an allergic type reaction to the chemotherapy.  Dexamethasone can cause you to feel energized, nervous/anxious/jittery, make you have trouble sleeping, and/or make you feel hot/flushed in the face/neck and/or look pink/red in the face/neck. These side effects will pass as the medication wears off.  POTENTIAL SIDE EFFECTS OF TREATMENT:  Carboplatin (Generic Name) Other Names: Paraplatin, CBDCA  About This Drug Carboplatin is a drug used to treat cancer. This drug is given in the vein (IV).  Takes 30 minutes to  infuse.   Possible Side Effects (More Common) . Nausea and throwing up (vomiting). These symptoms may happen within a few hours after your treatment and may last up to 24 hours. Medicines are available to stop or lessen these side effects. . Bone marrow depression. This is a decrease in the number of white blood cells, red blood cells, and platelets. This may raise your risk of infection, make you tired and weak (fatigue), and raise your risk of bleeding. . Soreness of the mouth and throat. You may have red areas, white patches, or sores that hurt. . This drug may affect how your kidneys work. Your kidney function will be checked as needed. . Electrolyte changes. Your blood will be checked for electrolyte changes as needed.  Possible Side Effects (Less Common) . Hair loss. Some patients lose their hair on the scalp and body. You may notice your hair thinning seven to 14 days after getting this drug. . Effects on the nerves are called peripheral neuropathy. You may feel numbness, tingling, or pain in your hands and feet. It may be hard for you to button your clothes, open jars, or walk as usual. The effect on the nerves may get worse with more doses of the drug. These effects get better in some people after the drug is stopped but it does not get better in all people. . Loose bowel movements (diarrhea) that may last for several days . Decreased hearing or ringing in the ears . Changes in the  way food and drinks taste . Changes in liver function. Your liver function will be checked as needed.     Allergic Reactions Serious allergic reactions including anaphylaxis are rare. While you are getting this drug in your vein (IV), tell your nurse right away if you have any of these symptoms of an allergic reaction: . Trouble catching your breath . Feeling like your tongue or throat are swelling . Feeling your heart beat quickly or in a not normal way (palpitations) . Feeling dizzy or lightheaded .  Flushing, itching, rash, and/or hives  Treating Side Effects . Drink 6-8 cups of fluids each day unless your doctor has told you to limit your fluid intake due to some other health problem. A cup is 8 ounces of fluid. If you throw up or have loose bowel movements, you should drink more fluids so that you do not become dehydrated (lack water in the body from losing too much fluid). . Mouth care is very important. Your mouth care should consist of routine, gentle cleaning of your teeth or dentures and rinsing your mouth with a mixture of 1/2 teaspoon of salt in 8 ounces of water or  teaspoon of baking soda in 8 ounces of water. This should be done at least after each meal and at bedtime. . If you have mouth sores, avoid mouthwash that has alcohol. Avoid alcohol and smoking because they can bother your mouth and throat. . If you have numbness and tingling in your hands and feet, be careful when cooking, walking, and handling sharp objects and hot liquids. . Talk with your nurse about getting a wig before you lose your hair. Also, call the [place] at 800-ACS-2345 to find out information about the "Look Good, Feel Better" program close to where you live. It is a free program where women getting chemotherapy can learn about wigs, turbans and scarves as well as makeup techniques and skin and nail care.  Food and Drug Interactions There are no known interactions of carboplatin with food. This drug may interact with other medicines. Tell your doctor and pharmacist about all the medicines and dietary supplements (vitamins, minerals, herbs and others) that you are taking at this time. The safety and use of dietary supplements and alternative diets are often not known. Using these might affect your cancer or interfere with your treatment. Until more is known, you should not use dietary supplements or alternative diets without your cancer doctor's help. When to Call the Doctor Call your doctor or  nurse right away if you have any of these symptoms: . Fever of 100.5 F (38 C) or above; chills . Bleeding or bruising that is not normal . Wheezing or trouble breathing . Nausea that stops you from eating or drinking . Throwing up more than once a day . Rash or itching . Loose bowel movements (diarrhea) more than four times a day or diarrhea with weakness or feeling lightheaded . Call your doctor or nurse as soon as possible if any of these symptoms happen: . Numbness, tingling, decreased feeling or weakness in fingers, toes, arms, or legs . Change in hearing, ringing in the ears . Blurred vision or other changes in eyesight . Decreased urine . Yellowing of skin or eyes  Sexual Problems and Reproductive Concerns Sexual problems and reproduction concerns may happen. In both men and women, this drug may affect your ability to have children. This cannot be determined before your treatment. Talk with your doctor or nurse if you  plan to have children. Ask for information on sperm or egg banking. In men, this drug may interfere with your ability to make sperm, but it should not change your ability to have sexual relations. In women, menstrual bleeding may become irregular or stop while you are getting this drug. Do not assume that you cannot become pregnant if you do not have a menstrual period. Women may go through signs of menopause (change of life) like vaginal dryness or itching. Vaginal lubricants can be used to lessen vaginal dryness, itching, and pain during sexual relations. Genetic counseling is available for you to talk about the effects of this drug therapy on future pregnancies. Also, a genetic counselor can look at the possible risk of problems in the unborn baby due to this medicine if an exposure happens during pregnancy. . Pregnancy warning: This drug may have harmful effects on the unborn child, so effective methods of birth control should be used during your cancer treatment. . Breast  feeding warning: It is not known if this drug passes into breast milk. For this reason, women should talk to their doctor about the risks and benefits of breast feeding during treatment with this drug because this drug may enter the breast milk and badly harm a breast feeding baby.   Pemetrexed (Generic Name) Other Names: ALIMTA  About This Drug Pemetrexed is used to treat cancer. It is given in the vein (IV).  Takes 10 minutes to infuse.   Possible Side Effects (More Common) . Bone marrow depression. This is a decrease in the number of white blood cells, red blood cells, and platelets. This may raise your risk of infection, make you tired and weak (fatigue), and raise your risk of bleeding. . Fatigue . Soreness of the mouth and throat. You may have red areas, white patches, or sores that hurt.  Possible Side Effects (less common) . Trouble breathing or feeling short of breath . Nausea and throwing up (vomiting) . Skin rash  Treating Side Effects . Ask your doctor or nurse about medicine that is available to help stop or lessen nausea and throwing up. . If you get a rash, do not put anything on it unless your doctor or nurse says you may. Keep the area around the rash clean and dry. . Mouth care is very important. Your mouth care should consist of routine, gentle cleaning of your teeth or dentures and rinsing your mouth with a mixture of 1/2 teaspoon of salt in 8 ounces of water or  teaspoon of baking soda in 8 ounces of water. This should be done at least after each meal and at bedtime. . If you have mouth sores, avoid mouthwash that has alcohol. Avoid alcohol and smoking because they can bother your mouth and throat.  Food and Drug Interactions There are no known interactions of this medicine with food. Tell your doctor if you are taking ibuprofen. Pemetrexed may interact with other medicines. Tell your doctor and pharmacist about all the medicines and dietary supplements (vitamins,  minerals, herbs and others) that you are taking at this time. The safety and use of dietary supplements and alternative diets are often not known. Using these might affect your cancer or interfere with your treatment. Until more is known, you should not use dietary supplements or alternative diets without your cancer doctor's help.  When to Call the Doctor Call your doctor or nurse right away if you have any of these symptoms: . Temperature of 100.5 F (38 C) or  above . Chills . Easy bruising or bleeding . Trouble breathing . Chest pain . Nausea that stops you from eating or drinking . Throwing up more than 3 times in a day Call your doctor or nurse as soon as possible if you have any of these symptoms: . Rash that does not go away with prescribed medicine . Nausea or vomiting that does not go away with prescribed medicine . Extreme fatigue that interferes with normal activities  Reproduction Concerns . Pregnancy warning: This drug may have harmful effects on the unborn child, so effective methods of birth control should be used during your cancer treatment. . Genetic counseling is available for you to talk about the effects of this drug therapy on future pregnancies. Also, a genetic counselor can look at the possible risk of problems in the unborn baby due to this medicine if an exposure happens during pregnancy. . Breast feeding warning: It is not known if this drug passes into breast milk. For this reason, women should talk to their doctor about the risks and benefits of breast feeding during treatment with this drug because this drug may enter the breast milk and badly harm a breast feeding baby.   SELF CARE ACTIVITIES WHILE ON CHEMOTHERAPY:  Hydration Increase your fluid intake 48 hours prior to treatment and drink at least 8 to 12 cups (64 ounces) of water/decaffeinated beverages per day after treatment. You can still have your cup of coffee or soda but these beverages do not count as  part of your 8 to 12 cups that you need to drink daily. No alcohol intake.  Medications Continue taking your normal prescription medication as prescribed.  If you start any new herbal or new supplements please let us know first to make sure it is safe.  Mouth Care Have teeth cleaned professionally before starting treatment. Keep dentures and partial plates clean. Use soft toothbrush and do not use mouthwashes that contain alcohol. Biotene is a good mouthwash that is available at most pharmacies or may be ordered by calling [phone]. Use warm salt water gargles (1 teaspoon salt per 1 quart warm water) before and after meals and at bedtime. Or you may rinse with 2 tablespoons of three-percent hydrogen peroxide mixed in eight ounces of water. If you are still having problems with your mouth or sores in your mouth please call the clinic. If you need dental work, please let the doctor know before you go for your appointment so that we can coordinate the best possible time for you in regards to your chemo regimen. You need to also let your dentist know that you are actively taking chemo. We may need to do labs prior to your dental appointment.  Skin Care Always use sunscreen that has not expired and with SPF (Sun Protection Factor) of 50 or higher. Wear hats to protect your head from the sun. Remember to use sunscreen on your hands, ears, face, & feet.  Use good moisturizing lotions such as udder cream, eucerin, or even Vaseline. Some chemotherapies can cause dry skin, color changes in your skin and nails.    . Avoid long, hot showers or baths. . Use gentle, fragrance-free soaps and laundry detergent. . Use moisturizers, preferably creams or ointments rather than lotions because the thicker consistency is better at preventing skin dehydration. Apply the cream or ointment within 15 minutes of showering. Reapply moisturizer at night, and moisturize your hands every time after you wash them.  Hair Loss  (if your doctor  says your hair will fall out)  . If your doctor says that your hair is likely to fall out, decide before you begin chemo whether you want to wear a wig. You may want to shop before treatment to match your hair color. . Hats, turbans, and scarves can also camouflage hair loss, although some people prefer to leave their heads uncovered. If you go bare-headed outdoors, be sure to use sunscreen on your scalp. . Cut your hair short. It eases the inconvenience of shedding lots of hair, but it also can reduce the emotional impact of watching your hair fall out. . Don't perm or color your hair during chemotherapy. Those chemical treatments are already damaging to hair and can enhance hair loss. Once your chemo treatments are done and your hair has grown back, it's OK to resume dyeing or perming hair. With chemotherapy, hair loss is almost always temporary. But when it grows back, it may be a different color or texture. In older adults who still had hair color before chemotherapy, the new growth may be completely gray.  Often, new hair is very fine and soft.  Infection Prevention Please wash your hands for at least 30 seconds using warm soapy water. Handwashing is the #1 way to prevent the spread of germs. Stay away from sick people or people who are getting over a cold. If you develop respiratory systems such as green/yellow mucus production or productive cough or persistent cough let us know and we will see if you need an antibiotic. It is a good idea to keep a pair of gloves on when going into grocery stores/[employer] to decrease your risk of coming into contact with germs on the carts, etc. Carry alcohol hand gel with you at all times and use it frequently if out in public. If your temperature reaches 100.5 or higher please call the clinic and let us know.  If it is after hours or on the weekend please go to the ER if your temperature is over 100.5.  Please have your own personal thermometer at  home to use.    Sex and bodily fluids If you are going to have sex, a condom must be used to protect the person that isn't taking chemotherapy. Chemo can decrease your libido (sex drive). For a few days after chemotherapy, chemotherapy can be excreted through your bodily fluids.  When using the toilet please close the lid and flush the toilet twice.  Do this for a few day after you have had chemotherapy.   Effects of chemotherapy on your sex life Some changes are simple and won't last long. They won't affect your sex life permanently. Sometimes you may feel: . too tired . not strong enough to be very active . sick or sore  . not in the mood . anxious or low Your anxiety might not seem related to sex. For example, you may be worried about the cancer and how your treatment is going. Or you may be worried about money, or about how you family are coping with your illness. These things can cause stress, which can affect your interest in sex. It's important to talk to your partner about how you feel. Remember - the changes to your sex life don't usually last long. There's usually no medical reason to stop having sex during chemo. The drugs won't have any long term physical effects on your performance or enjoyment of sex. Cancer can't be passed on to your partner during sex  Contraception It's important  to use reliable contraception during treatment. Avoid getting pregnant while you or your partner are having chemotherapy. This is because the drugs may harm the baby. Sometimes chemotherapy drugs can leave a man or woman infertile.  This means you would not be able to have children in the future. You might want to talk to someone about permanent infertility. It can be very difficult to learn that you may no longer be able to have children. Some people find counselling helpful. There might be ways to preserve your fertility, although this is easier for men than for women. You may want to speak to a  fertility expert. You can talk about sperm banking or harvesting your eggs. You can also ask about other fertility options, such as donor eggs. If you have or have had breast cancer, your doctor might advise you not to take the contraceptive pill. This is because the hormones in it might affect the cancer.  It is not known for sure whether or not chemotherapy drugs can be passed on through semen or secretions from the vagina. Because of this some doctors advise people to use a barrier method if you have sex during treatment. This applies to vaginal, anal or oral sex. Generally, doctors advise a barrier method only for the time you are actually having the treatment and for about a week after your treatment. Advice like this can be worrying, but this does not mean that you have to avoid being intimate with your partner. You can still have close contact with your partner and continue to enjoy sex.  Animals If you have cats or birds we just ask that you not change the litter or change the cage.  Please have someone else do this for you while you are on chemotherapy.   Food Safety During and After Cancer Treatment Food safety is important for people both during and after cancer treatment. Cancer and cancer treatments, such as chemotherapy, radiation therapy, and stem cell/bone marrow transplantation, often weaken the immune system. This makes it harder for your body to protect itself from foodborne illness, also called food poisoning. Foodborne illness is caused by eating food that contains harmful bacteria, parasites, or viruses.  Foods to avoid Some foods have a higher risk of becoming tainted with bacteria. These include: [name] Unwashed fresh fruit and vegetables, especially leafy vegetables that can hide dirt and other contaminants . Raw sprouts, such as alfalfa sprouts . Raw or undercooked beef, especially ground beef, or other raw or undercooked meat and poultry . Fatty, fried, or spicy foods  immediately before or after treatment.  These can sit heavy on your stomach and make you feel nauseous. . Raw or undercooked shellfish, such as oysters. . Sushi and sashimi, which often contain raw fish.  . Unpasteurized beverages, such as unpasteurized fruit juices, raw milk, raw yogurt, or cider . Undercooked eggs, such as soft boiled, over easy, and poached; raw, unpasteurized eggs; or foods made with raw egg, such as homemade raw cookie dough and homemade mayonnaise Simple steps for food safety Shop smart. . Do not buy food stored or displayed in an unclean area. . Do not buy bruised or damaged fruits or vegetables. . Do not buy cans that have cracks, dents, or bulges. . Pick up foods that can spoil at the end of your shopping trip and store them in a cooler on the way home. Prepare and clean up foods carefully. . Rinse all fresh fruits and vegetables under running water, and dry them  with a clean towel or paper towel. . Clean the top of cans before opening them. . After preparing food, wash your hands for 20 seconds with hot water and soap. Pay special attention to areas between fingers and under nails. . Clean your utensils and dishes with hot water and soap. [name] Disinfect your kitchen and cutting boards using 1 teaspoon of liquid, unscented bleach mixed into 1 quart of water.   Dispose of old food. . Eat canned and packaged food before its expiration date (the "use by" or "best before" date). . Consume refrigerated leftovers within 3 to 4 days. After that time, throw out the food. Even if the food does not smell or look spoiled, it still may be unsafe. Some bacteria, such as Listeria, can grow even on foods stored in the refrigerator if they are kept for too long. Take precautions when eating out. . At restaurants, avoid buffets and salad bars where food sits out for a long time and comes in contact with many people. Food can become contaminated when someone with a virus, often a norovirus,  or another "bug" handles it. . Put any leftover food in a "to-go" container yourself, rather than having the server do it. And, refrigerate leftovers as soon as you get home. . Choose restaurants that are clean and that are willing to prepare your food as you order it cooked.       MEDICATIONS:                                                                                                                                                                Compazine/Prochlorperazine 10mg  tablet. Take 1 tablet every 6 hours as needed for nausea/vomiting. (This can make you sleepy)   EMLA cream. Apply a quarter size amount to port site 1 hour prior to chemo. Do not rub in. Cover with plastic wrap.   Over-the-Counter Meds:  Miralax 1 capful in 8 oz of fluid daily. May increase to two times a day if needed. This is a stool softener. If this doesn't work proceed you can add:  Senokot S-start with 1 tablet two times a day and increase to 4 tablets two times a day if needed. (Total of 8 tablets in a 24 hour period). This is a stimulant laxative.   Call us if this does not help your bowels move.   Imodium 2mg  capsule. Take 2 capsules after the 1st loose stool and then 1 capsule every 2 hours until you go a total of 12 hours without having a loose stool. Call the [facility] if loose stools continue. If diarrhea occurs @ bedtime, take 2 capsules @ bedtime. Then take 2 capsules every 4 hours until morning. Call [facility].    Diarrhea Sheet  If you are having loose stools/diarrhea, please purchase Imodium and begin taking as outlined:  At the first sign of poorly formed or loose stools you should begin taking Imodium (loperamide) 2 mg capsules.  Take two caplets (4mg ) followed by one caplet (2mg ) every 2 hours until you have had no diarrhea for 12 hours.  During the night take two caplets (4mg ) at bedtime and continue every 4 hours during the night until the morning.  Stop taking Imodium only  after there is no sign of diarrhea for 12 hours.    Always call the [facility] if you are having loose stools/diarrhea that you can't get under control.  Loose stools/diarrhea leads to dehydration (loss of water) in your body.  We have other options of trying to get the loose stools/diarrhea to stop but you must let us know!      Constipation Sheet *Miralax in 8 oz. of fluid daily.  May increase to two times a day if needed.  This is a stool softener.  If this not enough to keep your bowel regular:  You can add:  *Senokot S, start with one tablet twice a day and can increase to 4 tablets twice a day if needed.  This is a stimulant laxative.   Sometimes when you take pain medication you need BOTH a medicine to keep your stool soft and a medicine to help your bowel push it out!  Please call if the above does not work for you.   Do not go more than 2 days without a bowel movement.  It is very important that you do not become constipated.  It will make you feel sick to your stomach (nausea) and can cause abdominal pain and vomiting.    Nausea Sheet   Compazine/Prochlorperazine 10mg  tablet. Take 1 tablet every 6 hours as needed for nausea/vomiting. (This can make you sleepy)  If you are having persistent nausea (nausea that does not stop) please call the [facility] and let us know the amount of nausea that you are experiencing.  If you begin to vomit, you need to call the [facility] and if it is the weekend and you have vomited more than one time and can't get it to stop-go to the Emergency Room.  Persistent nausea/vomiting can lead to dehydration (loss of fluid in your body) and will make you feel terrible.   Ice chips, sips of clear liquids, foods that are @ room temperature, crackers, and toast tend to be better tolerated.            SYMPTOMS TO REPORT AS SOON AS POSSIBLE AFTER TREATMENT:  FEVER GREATER THAN 100.5 F  CHILLS WITH OR WITHOUT FEVER  NAUSEA AND  VOMITING THAT IS NOT CONTROLLED WITH YOUR NAUSEA MEDICATION  UNUSUAL SHORTNESS OF BREATH  UNUSUAL BRUISING OR BLEEDING  TENDERNESS IN MOUTH AND THROAT WITH OR WITHOUT PRESENCE OF ULCERS  URINARY PROBLEMS  BOWEL PROBLEMS  UNUSUAL RASH    Wear comfortable clothing and clothing appropriate for easy access to any Portacath or PICC line. Let us know if there is anything that we can do to make your therapy better!    What to do if you need assistance after hours or on the weekends: CALL [phone].  HOLD on the line, do not hang up.  You will hear multiple messages but at the end you will be connected with a nurse triage line.  They will contact the doctor if necessary.  Most of the time they will  be able to assist you.  Do not call the hospital operator.      I have been informed and understand all of the instructions given to me and have received a copy. I have been instructed to call the clinic [phone] or my family physician as soon as possible for continued medical care, if indicated. I do not have any more questions at this time but understand that I may call the [facility] or the Patient Navigator at [phone] during office hours should I have questions or need assistance in obtaining follow-up care.

## 2018-06-30 ENCOUNTER — Telehealth (HOSPITAL_COMMUNITY): Payer: Self-pay

## 2018-06-30 ENCOUNTER — Ambulatory Visit (HOSPITAL_COMMUNITY)
Admission: RE | Admit: 2018-06-30 | Discharge: 2018-06-30 | Disposition: A | Discharge status: Home / Self Care | Payer: Medicare Other | Source: Ambulatory Visit | Attending: Nurse Practitioner | Admitting: Nurse Practitioner

## 2018-06-30 DIAGNOSIS — I119 Hypertensive heart disease without heart failure: Secondary | ICD-10-CM | POA: Diagnosis not present

## 2018-06-30 DIAGNOSIS — I358 Other nonrheumatic aortic valve disorders: Secondary | ICD-10-CM | POA: Diagnosis not present

## 2018-06-30 DIAGNOSIS — J449 Chronic obstructive pulmonary disease, unspecified: Secondary | ICD-10-CM | POA: Diagnosis not present

## 2018-06-30 DIAGNOSIS — Z86711 Personal history of pulmonary embolism: Secondary | ICD-10-CM | POA: Insufficient documentation

## 2018-06-30 DIAGNOSIS — C3491 Malignant neoplasm of unspecified part of right bronchus or lung: Secondary | ICD-10-CM | POA: Insufficient documentation

## 2018-06-30 MED ORDER — PERFLUTREN LIPID MICROSPHERE
1.0000 mL | INTRAVENOUS | Status: AC | PRN
  Administered 2018-06-30: 2 mL via INTRAVENOUS
  Administered 2018-06-30 (×2): 1 mL via INTRAVENOUS
  Filled 2018-06-30: qty 10

## 2018-06-30 NOTE — Telephone Encounter (Signed)
Patient called requesting we refill his combivent inhaler he was given by the hospitalist.  Reviewed medications with Dr. Delton Coombes and instructed the patient to call his PCP for management of inhalers.  Patient verbalized understanding.

## 2018-06-30 NOTE — Progress Notes (Signed)
*  PRELIMINARY RESULTS* Echocardiogram 2D EchocardiogramWITH DEFINITY has been performed.  Given by Titus Mould, Waynetta Pean, Cozette Braggs 06/30/2018, 12:11 PM

## 2018-07-04 ENCOUNTER — Encounter: Payer: Medicare Other | Admitting: Thoracic Surgery (Cardiothoracic Vascular Surgery)

## 2018-07-04 ENCOUNTER — Inpatient Hospital Stay (HOSPITAL_COMMUNITY): Payer: Medicare Other

## 2018-07-04 VITALS — BP 109/72 | HR 95 | Temp 98.2°F | Resp 18 | Wt 231.7 lb

## 2018-07-04 DIAGNOSIS — C3491 Malignant neoplasm of unspecified part of right bronchus or lung: Secondary | ICD-10-CM

## 2018-07-04 DIAGNOSIS — Z5111 Encounter for antineoplastic chemotherapy: Secondary | ICD-10-CM | POA: Diagnosis not present

## 2018-07-04 LAB — COMPREHENSIVE METABOLIC PANEL
ALBUMIN: 2.5 g/dL — AB (ref 3.5–5.0)
ALK PHOS: 182 U/L — AB (ref 38–126)
ALT: 26 U/L (ref 0–44)
AST: 42 U/L — ABNORMAL HIGH (ref 15–41)
Anion gap: 7 (ref 5–15)
BUN: 9 mg/dL (ref 6–20)
CALCIUM: 8.3 mg/dL — AB (ref 8.9–10.3)
CO2: 29 mmol/L (ref 22–32)
CREATININE: 0.91 mg/dL (ref 0.61–1.24)
Chloride: 101 mmol/L (ref 98–111)
GFR calc Af Amer: >60 mL/min (ref >60)
GFR calc non Af Amer: >60 mL/min (ref >60)
GLUCOSE: 201 mg/dL — AB (ref 70–99)
Potassium: 3 mmol/L — ABNORMAL LOW (ref 3.5–5.1)
SODIUM: 137 mmol/L (ref 135–145)
Total Bilirubin: 0.6 mg/dL (ref 0.3–1.2)
Total Protein: 7.7 g/dL (ref 6.5–8.1)

## 2018-07-04 LAB — CBC WITH DIFFERENTIAL/PLATELET
Basophils Absolute: 0 10*3/uL (ref 0.0–0.1)
Basophils Relative: 0 %
EOS ABS: 0.1 10*3/uL (ref 0.0–0.7)
Eosinophils Relative: 2 %
HCT: 33.5 % — ABNORMAL LOW (ref 39.0–52.0)
Hemoglobin: 10.2 g/dL — ABNORMAL LOW (ref 13.0–17.0)
LYMPHS ABS: 1.1 10*3/uL (ref 0.7–4.0)
Lymphocytes Relative: 20 %
MCH: 29.6 pg (ref 26.0–34.0)
MCHC: 30.4 g/dL (ref 30.0–36.0)
MCV: 97.1 fL (ref 78.0–100.0)
Monocytes Absolute: 0.4 10*3/uL (ref 0.1–1.0)
Monocytes Relative: 7 %
Neutro Abs: 4 10*3/uL (ref 1.7–7.7)
Neutrophils Relative %: 71 %
Platelets: 218 10*3/uL (ref 150–400)
RBC: 3.45 MIL/uL — AB (ref 4.22–5.81)
RDW: 17.2 % — ABNORMAL HIGH (ref 11.5–15.5)
WBC: 5.6 10*3/uL (ref 4.0–10.5)

## 2018-07-04 LAB — MAGNESIUM: Magnesium: 1.9 mg/dL (ref 1.7–2.4)

## 2018-07-04 MED ORDER — SODIUM CHLORIDE 0.9 % IV SOLN
Freq: Once | INTRAVENOUS | Status: AC
  Administered 2018-07-04: 10:00:00 via INTRAVENOUS

## 2018-07-04 MED ORDER — HEPARIN SOD (PORK) LOCK FLUSH 100 UNIT/ML IV SOLN
500.0000 [IU] | Freq: Once | INTRAVENOUS | Status: AC | PRN
  Administered 2018-07-04: 500 [IU]

## 2018-07-04 MED ORDER — POTASSIUM CHLORIDE CRYS ER 20 MEQ PO TBCR
EXTENDED_RELEASE_TABLET | ORAL | Status: AC
  Filled 2018-07-04: qty 2

## 2018-07-04 MED ORDER — POTASSIUM CHLORIDE CRYS ER 20 MEQ PO TBCR
40.0000 meq | EXTENDED_RELEASE_TABLET | Freq: Once | ORAL | Status: AC
  Administered 2018-07-04: 40 meq via ORAL

## 2018-07-04 MED ORDER — DEXAMETHASONE SODIUM PHOSPHATE 10 MG/ML IJ SOLN
INTRAMUSCULAR | Status: AC
Start: 2018-07-04 — End: ?
  Filled 2018-07-04: qty 1

## 2018-07-04 MED ORDER — SODIUM CHLORIDE 0.9 % IV SOLN
490.0000 mg/m2 | Freq: Once | INTRAVENOUS | Status: AC
  Administered 2018-07-04: 1100 mg via INTRAVENOUS
  Filled 2018-07-04: qty 40

## 2018-07-04 MED ORDER — PALONOSETRON HCL INJECTION 0.25 MG/5ML
INTRAVENOUS | Status: AC
  Filled 2018-07-04: qty 5

## 2018-07-04 MED ORDER — SODIUM CHLORIDE 0.9 % IV SOLN
750.0000 mg | Freq: Once | INTRAVENOUS | Status: AC
  Administered 2018-07-04: 750 mg via INTRAVENOUS
  Filled 2018-07-04: qty 75

## 2018-07-04 MED ORDER — DEXAMETHASONE SODIUM PHOSPHATE 10 MG/ML IJ SOLN
10.0000 mg | Freq: Once | INTRAMUSCULAR | Status: AC
  Administered 2018-07-04: 10 mg via INTRAVENOUS

## 2018-07-04 MED ORDER — SODIUM CHLORIDE 0.9% FLUSH
10.0000 mL | INTRAVENOUS | Status: DC | PRN
  Administered 2018-07-04: 10 mL
  Filled 2018-07-04: qty 10

## 2018-07-04 MED ORDER — PALONOSETRON HCL INJECTION 0.25 MG/5ML
0.2500 mg | Freq: Once | INTRAVENOUS | Status: AC
  Administered 2018-07-04: 0.25 mg via INTRAVENOUS

## 2018-07-04 NOTE — Progress Notes (Signed)
[id number] Labs reviewed and patient approved for treatment today per Dr. Delton Coombes. Udenyca added to treatment plan per Dr. Delton Coombes. Erik [name] tolerated Alimta and Carboplatin infusions well without incident or complaint. VSS upon completion of treatment. Pt to return tomorrow for Udenyca injection. Discharged self ambulatory on home O2 in satisfactory condition.

## 2018-07-04 NOTE — Progress Notes (Signed)
Chemotherapy teaching pulled together. 

## 2018-07-04 NOTE — Patient Instructions (Signed)
[facility] Discharge Instructions for Patients Receiving Chemotherapy   Beginning January 23rd 2017 lab work for the [facility] will be done in the  Main lab at [facility] on 1st floor. If you have a lab appointment with the [facility] please come in thru the  Main Entrance and check in at the main information desk   Today you received the following chemotherapy agents Carboplatin and Alimta. Follow-up as scheduled. Call clinic for any questions or concerns  To help prevent nausea and vomiting after your treatment, we encourage you to take your nausea medication   If you develop nausea and vomiting, or diarrhea that is not controlled by your medication, call the clinic.  The clinic phone number is [phone]. Office hours are Monday-Friday 8:30am-5:00pm.  BELOW ARE SYMPTOMS THAT SHOULD BE REPORTED IMMEDIATELY:  *FEVER GREATER THAN 101.0 F  *CHILLS WITH OR WITHOUT FEVER  NAUSEA AND VOMITING THAT IS NOT CONTROLLED WITH YOUR NAUSEA MEDICATION  *UNUSUAL SHORTNESS OF BREATH  *UNUSUAL BRUISING OR BLEEDING  TENDERNESS IN MOUTH AND THROAT WITH OR WITHOUT PRESENCE OF ULCERS  *URINARY PROBLEMS  *BOWEL PROBLEMS  UNUSUAL RASH Items with * indicate a potential emergency and should be followed up as soon as possible. If you have an emergency after office hours please contact your primary care physician or go to the nearest emergency department.  Please call the clinic during office hours if you have any questions or concerns.   You may also contact the Patient Navigator at [phone] should you have any questions or need assistance in obtaining follow up care.      Resources For Cancer Patients and their Caregivers ? American [employer] Society: Can assist with transportation, wigs, general needs, runs Look Good Feel Better.        765-474-6858 ? Cancer Care: Provides financial assistance, online support groups, medication/co-pay assistance.   1-800-813-HOPE 223-601-5482) ? [facility] Assists [place] cancer patients and their families through emotional , educational and financial support.  (315)196-0626 ? [place] DSS Where to apply for food stamps, Medicaid and utility assistance. 289-676-0929 ? [facility]: Transportation to medical appointments. 302 830 3482 ? Social Security Administration: May apply for disability if have a Stage IV cancer. 559-589-0714 910-547-2314 ? [employer], Disability and Transit Services: Assists with nutrition, care and transit needs. (541)176-2855

## 2018-07-05 ENCOUNTER — Inpatient Hospital Stay (HOSPITAL_COMMUNITY): Payer: Medicare Other

## 2018-07-05 VITALS — BP 129/70 | HR 100 | Temp 98.4°F | Resp 20

## 2018-07-05 DIAGNOSIS — Z5111 Encounter for antineoplastic chemotherapy: Secondary | ICD-10-CM | POA: Diagnosis not present

## 2018-07-05 DIAGNOSIS — C3491 Malignant neoplasm of unspecified part of right bronchus or lung: Secondary | ICD-10-CM

## 2018-07-05 MED ORDER — PEGFILGRASTIM-CBQV 6 MG/0.6ML ~~LOC~~ SOSY
6.0000 mg | PREFILLED_SYRINGE | Freq: Once | SUBCUTANEOUS | Status: AC
  Administered 2018-07-05: 6 mg via SUBCUTANEOUS

## 2018-07-05 MED ORDER — PEGFILGRASTIM-CBQV 6 MG/0.6ML ~~LOC~~ SOSY
PREFILLED_SYRINGE | SUBCUTANEOUS | Status: AC
  Filled 2018-07-05: qty 0.6

## 2018-07-06 ENCOUNTER — Encounter (HOSPITAL_COMMUNITY): Payer: Self-pay

## 2018-07-06 ENCOUNTER — Other Ambulatory Visit: Payer: Self-pay

## 2018-07-06 NOTE — Patient Instructions (Signed)
[facility] at [facility] Discharge Instructions  Ellen Henri given today  Follow up as scheduled.   Thank you for choosing [facility] at [facility] to provide your oncology and hematology care.  To afford each patient quality time with our provider, please arrive at least 15 minutes before your scheduled appointment time.   If you have a lab appointment with the [facility] please come in thru the  Main Entrance and check in at the main information desk  You need to re-schedule your appointment should you arrive 10 or more minutes late.  We strive to give you quality time with our providers, and arriving late affects you and other patients whose appointments are after yours.  Also, if you no show three or more times for appointments you may be dismissed from the clinic at the providers discretion.     Again, thank you for choosing [facility].  Our hope is that these requests will decrease the amount of time that you wait before being seen by our physicians.       _____________________________________________________________  Should you have questions after your visit to [facility], please contact our office at [phone] between the hours of 8:00 a.m. and 4:30 p.m.  Voicemails left after 4:00 p.m. will not be returned until the following business day.  For prescription refill requests, have your pharmacy contact our office and allow 72 hours.    Cancer Center Support Programs:   > Cancer Support Group  2nd Tuesday of the month 1pm-2pm, Journey Room

## 2018-07-06 NOTE — Progress Notes (Signed)
Udenyca given today per orders. See MAR.   Patient tolerated it well without problems. Vitals stable and discharged home from clinic via wheelchair to lobby where sister is.  Follow up as scheduled.

## 2018-07-10 ENCOUNTER — Encounter (HOSPITAL_COMMUNITY): Payer: Self-pay | Admitting: *Deleted

## 2018-07-10 MED ORDER — SUCRALFATE 1 GM/10ML PO SUSP: ORAL | 2 refills | Status: DC

## 2018-07-10 NOTE — Progress Notes (Signed)
Patient called today with complaints of sore throat and coughing up some blood tinged sputum.  He states that it hurts to swallow saliva, liquids or foods.  He states that it started hurting over the weekend.  It is keeping him from eating at this time.  Per standing orders by Dr. Delton Coombes, I have called in prescription to his pharmacy.

## 2018-07-11 ENCOUNTER — Emergency Department (HOSPITAL_COMMUNITY): Payer: Medicare Other

## 2018-07-11 ENCOUNTER — Emergency Department (HOSPITAL_COMMUNITY)
Admission: EM | Admit: 2018-07-11 | Discharge: 2018-07-11 | Disposition: A | Discharge status: Home / Self Care | Payer: Medicare Other | Attending: Emergency Medicine | Admitting: Emergency Medicine

## 2018-07-11 ENCOUNTER — Other Ambulatory Visit: Payer: Self-pay

## 2018-07-11 ENCOUNTER — Encounter (HOSPITAL_COMMUNITY): Payer: Self-pay | Admitting: Emergency Medicine

## 2018-07-11 DIAGNOSIS — J449 Chronic obstructive pulmonary disease, unspecified: Secondary | ICD-10-CM | POA: Diagnosis not present

## 2018-07-11 DIAGNOSIS — R042 Hemoptysis: Secondary | ICD-10-CM | POA: Diagnosis not present

## 2018-07-11 DIAGNOSIS — Z79899 Other long term (current) drug therapy: Secondary | ICD-10-CM | POA: Diagnosis not present

## 2018-07-11 DIAGNOSIS — Z87891 Personal history of nicotine dependence: Secondary | ICD-10-CM | POA: Insufficient documentation

## 2018-07-11 DIAGNOSIS — R072 Precordial pain: Secondary | ICD-10-CM | POA: Diagnosis present

## 2018-07-11 LAB — CBC WITH DIFFERENTIAL/PLATELET
BASOS ABS: 0 10*3/uL (ref 0.0–0.1)
BASOS PCT: 0 %
Eosinophils Absolute: 0 10*3/uL (ref 0.0–0.7)
Eosinophils Relative: 5 %
HCT: 26.5 % — ABNORMAL LOW (ref 39.0–52.0)
Hemoglobin: 8.4 g/dL — ABNORMAL LOW (ref 13.0–17.0)
Lymphocytes Relative: 93 %
Lymphs Abs: 0.4 10*3/uL (ref 0.7–4.0)
MCH: 29.7 pg (ref 26.0–34.0)
MCHC: 31.7 g/dL (ref 30.0–36.0)
MCV: 93.6 fL (ref 78.0–100.0)
MONOS PCT: 0 %
Monocytes Absolute: 0 10*3/uL (ref 0.1–1.0)
NEUTROS ABS: 0 10*3/uL (ref 1.7–7.7)
NEUTROS PCT: 2 %
PLATELETS: 72 10*3/uL — AB (ref 150–400)
RBC: 2.83 MIL/uL — ABNORMAL LOW (ref 4.22–5.81)
RDW: 16.4 % — AB (ref 11.5–15.5)
WBC: 0.4 10*3/uL — CL (ref 4.0–10.5)

## 2018-07-11 LAB — BASIC METABOLIC PANEL
Anion gap: 7 (ref 5–15)
BUN: 13 mg/dL (ref 6–20)
CALCIUM: 8.1 mg/dL — AB (ref 8.9–10.3)
CO2: 29 mmol/L (ref 22–32)
Chloride: 98 mmol/L (ref 98–111)
Creatinine, Ser: 0.68 mg/dL (ref 0.61–1.24)
Glucose, Bld: 119 mg/dL — ABNORMAL HIGH (ref 70–99)
Potassium: 3.5 mmol/L (ref 3.5–5.1)
SODIUM: 134 mmol/L — AB (ref 135–145)

## 2018-07-11 LAB — LACTIC ACID, PLASMA
LACTIC ACID, VENOUS: 0.9 mmol/L (ref 0.5–1.9)
Lactic Acid, Venous: 1.2 mmol/L (ref 0.5–1.9)

## 2018-07-11 LAB — TROPONIN I
Troponin I: <0.03 ng/mL (ref <0.03)
Troponin I: <0.03 ng/mL (ref <0.03)

## 2018-07-11 MED ORDER — IOPAMIDOL (ISOVUE-370) INJECTION 76%
100.0000 mL | Freq: Once | INTRAVENOUS | Status: AC | PRN
  Administered 2018-07-11: 100 mL via INTRAVENOUS

## 2018-07-11 MED ORDER — SODIUM CHLORIDE 0.9 % IV BOLUS
500.0000 mL | Freq: Once | INTRAVENOUS | Status: AC
  Administered 2018-07-11: 500 mL via INTRAVENOUS

## 2018-07-11 NOTE — ED Notes (Signed)
Pt has chemo every 3 weeks. Had last one week ago. Intermittent blood tinged sputum with coughing since yesterday. Had PNA about 2 months ago

## 2018-07-11 NOTE — ED Provider Notes (Signed)
[facility] EMERGENCY DEPARTMENT Provider Note   CSN: [id number] Arrival date & time: 07/11/18  4332     History   Chief Complaint Chief Complaint  Patient presents with  . Hemoptysis    HPI Erik [name] is a 57 y.o. male.  HPI  Erik [name] is a 57 y.o. male with hx of metastatic lung CA,  Previous empyema, who presents to the Emergency Department complaining of coughing up blood tinged sputum.  Symptoms present for few days.  He started chemotherapy one week ago for second time secondary to adenocarcinoma of the lung.  He also complains of generalized weakness and sore throat with white blisters in his throat. He thought he was suppose to have a prescription called in for a mouthwash, but he hasn't received it.  Cough is productive of white sputum and intermittently produces streaks of blood.   No fevers at home.  He denies chest pain, lower extremity edema, rash, vomiting or diarrhea.     Past Medical History:  Diagnosis Date  . Arnold-Chiari syndrome ([place])   . Arthritis   . Asthma   . Chronic back pain   . COPD (chronic obstructive pulmonary disease) ([place])   . Depression   . Dyspnea    with exertion   . GERD (gastroesophageal reflux disease)   . Hypertension   . Pneumonia   . Pulmonary embolus ([place])   . Seasonal allergies   . Spinal stenosis of lumbar region   . 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 Wheezing     Patient Active Problem List   Diagnosis Date Noted  . Empyema ([place])   . Palliative care by specialist   . VRE (vancomycin-resistant Enterococci) infection 04/25/2018  . Wound infection 04/18/2018  . Malnutrition of moderate degree 03/30/2018  . Sepsis due to pneumonia ([place]) 03/23/2018  . Empyema of lung ([place]) 03/23/2018  . History of pulmonary embolism 03/23/2018  . Empyema lung ([place]) 03/23/2018  . Bronchiectasis with acute exacerbation ([place])   . Acute on chronic respiratory failure with hypoxia ([place])   . Essential  hypertension 03/07/2018  . Steroid-induced hyperglycemia 03/07/2018  . COPD (chronic obstructive pulmonary disease) ([place]) 03/06/2018  . HCAP (healthcare-associated pneumonia) 01/28/2018  . Taking multiple medications for chronic disease 03/08/2017  . Radiation-induced esophagitis 02/02/2017  . Loss of weight 12/10/2016  . Goals of care, counseling/discussion 12/10/2016  . Esophageal dysphagia 12/10/2016  . Abdominal pain, epigastric 12/10/2016  . Adenocarcinoma of right lung ([place]) 07/23/2016  . GERD 01/13/2009  . ACTINIC KERATOSIS 01/13/2009  . Anxiety state 12/27/2008  . Depression with anxiety 12/27/2008  . ASTHMA 12/27/2008  . OSTEOARTHRITIS 12/27/2008  . LOW BACK PAIN 12/27/2008    Past Surgical History:  Procedure Laterality Date  . APPLICATION OF WOUND VAC  04/19/2018   Procedure: APPLICATION OF WOUND VAC;  Surgeon: [name], MD;  Location: [place];  Service: Vascular;;  . APPLICATION OF WOUND VAC Right 04/21/2018   Procedure: WOUND VAC CHANGE;  Surgeon: [name], MD;  Location: [place];  Service: Thoracic;  Laterality: Right;  . APPLICATION OF WOUND VAC N/A 04/24/2018   Procedure: WOUND VAC CHANGE;  Surgeon: [name], MD;  Location: [place];  Service: Thoracic;  Laterality: N/A;  . BACK SURGERY     5 total  . BIOPSY  01/03/2017   Procedure: BIOPSY;  Surgeon: Daneil Dolin, MD;  [facility];  Service: Endoscopy;;  gastric  . CHEST  TUBE INSERTION Right 04/19/2018   Procedure: CHEST TUBE INSERTION;  Surgeon: [name], MD;  Location: [place];  Service: Vascular;  Laterality: Right;  . CHEST TUBE INSERTION Right 04/19/2018   Procedure: CHEST TUBE INSERTION;  Surgeon: [name], MD;  Location: [place];  Service: Thoracic;  Laterality: Right;  . COLONOSCOPY WITH PROPOFOL N/A 04/04/2017   Procedure: COLONOSCOPY WITH PROPOFOL;  Surgeon: Daneil Dolin, MD;  [facility];  Service: Endoscopy;  Laterality: N/A;   8:45am  . DECORTICATION Right 03/28/2018   Procedure: DECORTICATION;  Surgeon: [name], MD;  Location: [facility];  Service: Thoracic;  Laterality: Right;  . ESOPHAGOGASTRODUODENOSCOPY (EGD) WITH PROPOFOL N/A 01/03/2017   Procedure: ESOPHAGOGASTRODUODENOSCOPY (EGD) WITH PROPOFOL;  Surgeon: Daneil Dolin, MD;  [facility];  Service: Endoscopy;  Laterality: N/A;  . I&D EXTREMITY Right 04/19/2018   Procedure: IRRIGATION AND DEBRIDEMENT RIGHT CHEST WOUND;  Surgeon: [name], MD;  Location: [place];  Service: Vascular;  Laterality: Right;  . MALONEY DILATION N/A 01/03/2017   Procedure: Venia Minks DILATION;  Surgeon: Daneil Dolin, MD;  [facility];  Service: Endoscopy;  Laterality: N/A;  . MULTIPLE EXTRACTIONS WITH ALVEOLOPLASTY N/A 07/08/2014   Procedure: MULTIPLE EXTRACION WITH ALVEOLOPLASTY with EXCISION LESION RIGHT SIDE OF TONGUE;  Surgeon: Gae Bon, DDS;  Location: [place];  Service: Oral Surgery;  Laterality: N/A;  . PORTACATH PLACEMENT Right 07/30/2016   Procedure: INSERTION PORT-A-CATH;  Surgeon: Vickie Epley, MD;  Location: [facility];  Service: Vascular;  Laterality: Right;  . THORACOTOMY  04/19/2018   Procedure: THORACOTOMY MAJOR;  Surgeon: [name], MD;  Location: [place];  Service: Vascular;;  . VIDEO ASSISTED THORACOSCOPY Right 04/21/2018   Procedure: VIDEO ASSISTED THORACOSCOPY;  Surgeon: [name], MD;  Location: [place];  Service: Thoracic;  Laterality: Right;  [name] VIDEO ASSISTED THORACOSCOPY Right 04/24/2018   Procedure: VIDEO ASSISTED THORACOSCOPY;  Surgeon: [name], MD;  Location: [place];  Service: Thoracic;  Laterality: Right;  [name] VIDEO ASSISTED THORACOSCOPY (VATS)/EMPYEMA Right 03/28/2018   Procedure: VIDEO ASSISTED THORACOSCOPY (VATS)/EMPYEMA;  Surgeon: [name], MD;  Location: [place];  Service: Thoracic;  Laterality: Right;  [name] VIDEO BRONCHOSCOPY WITH ENDOBRONCHIAL ULTRASOUND N/A 07/19/2016    Procedure: VIDEO BRONCHOSCOPY WITH ENDOBRONCHIAL ULTRASOUND;  Surgeon: [name], MD;  Location: [place];  Service: Thoracic;  Laterality: N/A;        Home Medications    Prior to Admission medications   Medication Sig Start Date End Date Taking? Authorizing Provider  acetaminophen (TYLENOL) 325 MG tablet Take 2 tablets (650 mg total) by mouth every 6 (six) hours as needed for mild pain (or Fever >/= 101). 05/01/18   Barrett, Erin R, PA-C  albuterol (PROVENTIL HFA;VENTOLIN HFA) 108 (90 Base) MCG/ACT inhaler Inhale 2 puffs into the lungs every 6 (six) hours as needed for wheezing or shortness of breath. 06/28/18   Lockamy, Randi L, NP-C  albuterol (PROVENTIL) (2.5 MG/3ML) 0.083% nebulizer solution Take 3 mLs (2.5 mg total) by nebulization every 6 (six) hours as needed for wheezing or shortness of breath (if unable to experience relief by using combivent inhaler). 03/08/18   Barton Dubois, MD  ALPRAZolam Duanne Moron) 1 MG tablet Take 1 mg by mouth 2 (two) times daily as needed for anxiety or sleep (takes 1 tablet and bedtime for sleep and 1 dose during the day only if needed).  06/26/16   [provider]  amLODipine (NORVASC) 5 MG tablet  05/17/18   [provider]  apixaban (ELIQUIS) 5 MG TABS tablet Take 1 tablet (5 mg total) by mouth 2 (two) times daily. 06/14/18   Lockamy, Theresia Lo, NP-C  apixaban (ELIQUIS) 5 MG TABS tablet Take 1 tablet (5 mg total) by mouth 2 (two) times daily. 06/14/18   Lockamy, Theresia Lo, NP-C  Artificial Tear Solution (SOOTHE XP) SOLN Apply 2 drops to eye daily.    [provider]  CARBOPLATIN IV Inject into the vein.    [provider]  escitalopram (LEXAPRO) 20 MG tablet  05/10/18   [provider]  folic acid (FOLVITE) 1 MG tablet Take 1 tablet (1 mg total) by mouth daily. 06/27/18   Lockamy, Randi L, NP-C  furosemide (LASIX) 20 MG tablet Take 40 mg by mouth daily.    [provider]  gabapentin (NEURONTIN) 600 MG tablet  Take 600 mg by mouth 2 (two) times daily.  08/19/17   [provider]  guaiFENesin (MUCINEX) 600 MG 12 hr tablet Take 600 mg by mouth 2 (two) times daily as needed (for congestion.).    [provider]  Ipratropium-Albuterol (COMBIVENT) 20-100 MCG/ACT AERS respimat Inhale 1 puff into the lungs every 6 (six) hours as needed for wheezing or shortness of breath. 03/08/18   Barton Dubois, MD  lactulose (CHRONULAC) 10 GM/15ML solution Take 15 mLs (10 g total) by mouth 3 (three) times daily as needed for mild constipation or moderate constipation. 01/23/18   Jacquelin Hawking, NP  levalbuterol Penne Lash) 0.63 MG/3ML nebulizer solution Take 3 mLs (0.63 mg total) by nebulization every 8 (eight) hours as needed for wheezing or shortness of breath. 05/01/18   Barrett, Erin R, PA-C  loperamide (IMODIUM) 2 MG capsule Take 1 capsule (2 mg total) by mouth as needed for diarrhea or loose stools. Patient not taking: Reported on 07/04/2018 04/08/18   Patrecia Pour, MD  methadone (DOLOPHINE) 10 MG tablet Take 20 mg by mouth 5 (five) times daily. Takes 2 tablets 5 times daily 06/26/16   [provider]  mirtazapine (REMERON) 15 MG tablet Take 1 tablet by mouth at bedtime. 08/17/17   [provider]  Misc. Devices MISC POC at 2 lpm via [place] with conserver / pulse ox on exertion. 05/03/18   Higgs, Mathis Dad, MD  Multiple Vitamin (MULTIVITAMIN WITH MINERALS) TABS tablet Take 1 tablet by mouth daily. Centrum    [provider]  ondansetron (ZOFRAN) 8 MG tablet TAKE 1 TABLET BY MOUTH EVERY 8 HOURS AS NEEDED FOR NAUSEA AND VOMITING. 03/02/18   Holley Bouche, NP  OXYGEN Inhale 3 L into the lungs.    [provider]  pantoprazole (PROTONIX) 40 MG tablet Take 1 tablet (40 mg total) by mouth 2 (two) times daily. 06/14/18   Lockamy, Randi L, NP-C  PEMEtrexed Disodium (ALIMTA IV) Inject into the vein.    [provider]  potassium chloride SA (K-DUR,KLOR-CON) 20 MEQ tablet Take 1  tablet (20 mEq total) by mouth daily. 03/14/18   Derek Jack, MD  prochlorperazine (COMPAZINE) 10 MG tablet Take 1 tablet (10 mg total) by mouth every 6 (six) hours as needed (Nausea or vomiting). 06/28/18   Derek Jack, MD  sucralfate (CARAFATE) 1 GM/10ML suspension carafate 1gm/67ml & viscous Lidocaine 2% 1:1 mixture.  Swish and swallow 1 tablespoon four times a day. 07/10/18   Derek Jack, MD  tiZANidine (ZANAFLEX) 4 MG tablet Take 4 mg by mouth every 8 (eight)  hours as needed for muscle spasms.  06/03/16   [provider]  varenicline (CHANTIX) 0.5 MG tablet TAKE (2) TABLETS BY MOUTH TWICE DAILY. 04/11/18   Holley Bouche, NP    Family History Family History  Problem Relation Age of Onset  . Cancer Mother        breast cancer  . Heart failure Brother   . Colon cancer Neg Hx        not sure, ?grandfather and/or uncle    Social History Social History   Tobacco Use  . Smoking status: Former Smoker    Packs/day: 0.25    Years: 38.00    Pack years: 9.50    Types: Cigarettes    Last attempt to quit: 02/16/2018    Years since quitting: 0.3  . Smokeless tobacco: Former Systems developer    Types: Chew  . Tobacco comment: [place] STAY  Substance Use Topics  . Alcohol use: Yes    Alcohol/week: 1.2 oz    Types: 2 Cans of beer per week    Comment: reports drinking 0-2 beers a week  . Drug use: Yes    Frequency: 7.0 times per week    Types: Marijuana    Comment: daily for cancer; pt denies any use at present time 08/23/17     Allergies   Demeclocycline and Tetracyclines & related   Review of Systems Review of Systems  Constitutional: Positive for fatigue. Negative for appetite change, chills and fever.  HENT: Positive for mouth sores and sore throat. Negative for congestion and trouble swallowing.   Respiratory: Positive for cough. Negative for chest tightness, shortness of breath and wheezing.        Cough with intermittent blood tinged  sputum.    Cardiovascular: Negative for chest pain and leg swelling.  Gastrointestinal: Negative for abdominal pain, nausea and vomiting.  Genitourinary: Negative for dysuria.  Musculoskeletal: Negative for arthralgias.  Skin: Negative for rash.  Neurological: Negative for dizziness, syncope, speech difficulty, weakness (generalized weakness), numbness and headaches.  Hematological: Negative for adenopathy. Bruises/bleeds easily.  All other systems reviewed and are negative.    Physical Exam Updated Vital Signs BP 134/90   Pulse (!) 111   Temp 98.2 F (36.8 C) (Oral)   Resp 18   Ht 6' (1.829 m)   Wt 99.8 kg (220 lb)   SpO2 100%   BMI 29.84 kg/m   Physical Exam  Constitutional: He is oriented to person, place, and time. No distress.  HENT:  Head: Atraumatic.  Mouth/Throat: Uvula is midline. Mucous membranes are dry. Oral lesions present. No uvula swelling.  Multiple pin point white oral lesions of the oropharynx and buccal mucosa.  No edema.  Uvula midline, non-edematous  Eyes: Pupils are equal, round, and reactive to light. EOM are normal.  Neck: Normal range of motion. No JVD present.  Cardiovascular: Regular rhythm and intact distal pulses. Tachycardia present.  Pulmonary/Chest: Effort normal. No respiratory distress. He has no wheezes. He exhibits no tenderness.  Diminished lung sounds on right. No rales or wheezing. Well healing surgical scar lateral right chest.  No erythema  Abdominal: Soft. He exhibits no distension. There is no tenderness.  Musculoskeletal: Normal range of motion. He exhibits no edema.  Neurological: He is alert and oriented to person, place, and time. He has normal strength. No sensory deficit. GCS eye subscore is 4. GCS verbal subscore is 5. GCS motor subscore is 6.  Skin: Skin is warm. Capillary refill takes less  than 2 seconds. No rash noted.  Psychiatric: He has a normal mood and affect.  Nursing note and vitals reviewed.    ED Treatments /  Results  Labs (all labs ordered are listed, but only abnormal results are displayed) Labs Reviewed  CBC WITH DIFFERENTIAL/PLATELET - Abnormal; Notable for the following components:      Result Value   WBC 0.4 (*)    RBC 2.83 (*)    Hemoglobin 8.4 (*)    HCT 26.5 (*)    RDW 16.4 (*)    Platelets 72 (*)    All other components within normal limits  BASIC METABOLIC PANEL - Abnormal; Notable for the following components:   Sodium 134 (*)    Glucose, Bld 119 (*)    Calcium 8.1 (*)    All other components within normal limits  LACTIC ACID, PLASMA  TROPONIN I  LACTIC ACID, PLASMA    EKG EKG Interpretation  Date/Time:  Tuesday July 11 2018 09:53:10 EDT Ventricular Rate:  119 PR Interval:    QRS Duration: 92 QT Interval:  333 QTC Calculation: 469 R Axis:   38 Text Interpretation:  Sinus tachycardia Ventricular premature complex Abnormal R-wave progression, early transition When compared with ECG of 04/19/2018 Rate faster Confirmed by Francine Graven [phone]) on 07/11/2018 11:40:45 AM   Radiology Dg Chest 2 View  Result Date: 07/11/2018 CLINICAL DATA:  Left-sided chest pain.  Hemoptysis. EXAM: CHEST - 2 VIEW COMPARISON:  05/31/2018 FINDINGS: Power port tip is unchanged in the right atrium. The left chest remains clear. No change in the appearance of the right hemithorax with volume loss, pleural density and only a small amount of aerated lung in the upper chest. No acute bone finding. IMPRESSION: Left chest is clear. Chronic volume loss in the right hemithorax with pleural and parenchymal density. No new finding. Electronically Signed   By: Nelson Chimes M.D.   On: 07/11/2018 10:39   Ct Angio Chest Pe W And/or Wo Contrast  Result Date: 07/11/2018 CLINICAL DATA:  Hemoptysis.  Right-sided lung carcinoma EXAM: CT ANGIOGRAPHY CHEST WITH CONTRAST TECHNIQUE: Multidetector CT imaging of the chest was performed using the standard protocol during bolus administration of intravenous contrast.  Multiplanar CT image reconstructions and MIPs were obtained to evaluate the vascular anatomy. CONTRAST:  150mL ISOVUE-370 IOPAMIDOL (ISOVUE-370) INJECTION 76% COMPARISON:  Chest CT June 19, 2018; chest radiograph July 11, 2018 FINDINGS: Cardiovascular: There is no demonstrable pulmonary embolus. There is no thoracic aortic aneurysm or dissection. There is slight calcification in the proximal left subclavian artery. Other visualized proximal great vessel regions appear normal. There is a moderate pericardial effusion. Port-A-Cath tip is in the superior vena cava. Mediastinum/Nodes: Thyroid appears normal. There are multiple enlarged left axillary lymph nodes, largest measuring 1.7 x 1.7 cm, essentially stable. There are multiple prominent subpectoral and supraclavicular lymph nodes, essentially stable. Largest lymph node in these regions is in the left supraclavicular region measuring 2.1 x 1.3 cm. There remain multiple enlarged mediastinal lymph nodes throughout the chest. There is an enlarged aortopulmonary window node on the left measuring 2.0 x 1.8 cm, essentially stable. There is extensive subcarinal adenopathy with largest individual lymph node measuring 1.4 x 1.3 cm. There are several borderline prominent lymph nodes in the left hilum. The right hilum is obscured due to mass and consolidation. There may well be adenopathy in this area as well. No esophageal lesions are evident. Lungs/Pleura: Extensive consolidation throughout much of the right lung remains. There is felt to be  a combination of airspace consolidation, atelectasis, and mass on the right as well as a small right pleural effusion. The changes on the right are similar to recent CT there is a suspected mass in the right lower lobe measuring approximately 4 x 4 cm which is difficult to discern from other surrounding consolidation and appears grossly stable. There are areas of mild atelectasis on the left. There is no mass or consolidation in the  left lung. No left pleural effusion evident. Do a degree of underlying emphysematous change on the left is evident. Upper Abdomen: In the visualized upper abdomen, spleen appears enlarged although incompletely visualized. Liver has a somewhat nodular contour consistent with cirrhosis. Adrenals appear unremarkable bilaterally. Musculoskeletal: No evident blastic or lytic bone lesions. There is degenerative change in the thoracic spine. Review of the MIP images confirms the above findings. IMPRESSION: 1. No evident pulmonary embolus. No thoracic aortic aneurysm or dissection. 2. Extensive opacity on the right due to combination of mass, atelectasis, consolidation as well as right pleural effusion. Appearance of the right lung is essentially stable compared to the previous study. 3. Underlying emphysematous change left lung. No edema or consolidation on the left. No mass or pleural effusion on the left evident. 4.  Essentially stable multifocal adenopathy. 5.  Moderate pericardial effusion, similar to recent CT. 6. Visualized liver shows evidence of cirrhosis. Splenomegaly, incompletely visualized, also present on recent CT examination. Emphysema (ICD10-J43.9). Electronically Signed   By: Lowella Grip III M.D.   On: 07/11/2018 14:39    Procedures Procedures (including critical care time)  Medications Ordered in ED Medications  sodium chloride 0.9 % bolus 500 mL (0 mLs Intravenous Stopped 07/11/18 1214)     Initial Impression / Assessment and Plan / ED Course  I have reviewed the triage vital signs and the nursing notes.  Pertinent labs & imaging results that were available during my care of the patient were reviewed by me and considered in my medical decision making (see chart for details).      Pt non-toxic appearing.  Tachycardic. Denies chest pain and dyspnea. No fevers at home.  Started chemo last week. Received Udenyca post chemo infusions.  [place] Dr. Delton Coombes and discussed the  findings.  It was noted to me that patient has a history of chronic tachycardia and his been tachycardic in the 110's on previous oncology visits and noted to also previously had blood-tinged sputum.    Initially patient's tachycardia was felt to be related to dehydration, but did not improve after IV fluids.  Patient recently anticoagulated on Eliquis, but with tachycardia and pleuritic pain will obtain PE study.  Discussed findings with the patient.  CT angio negative for PE.  Lactic acid within normal limits.  Hemoptysis felt to be related to his Eliquis.  Offered admission, but patient declines adamantly stating that he wants to go home.  He appears stable for discharge home, he agrees to close follow-up with his PCP and with his oncologist.  Strict return precautions were discussed. He has called oncology clinic during his visit and states that he has been called in a prescription for a mouthwash.  Final Clinical Impressions(s) / ED Diagnoses   Final diagnoses:  Precordial pain  Hemoptysis    ED Discharge Orders    None       Kem Parkinson, PA-C 07/13/18 2219    Francine Graven, DO 07/14/18 [phone]

## 2018-07-11 NOTE — Discharge Instructions (Addendum)
Call your primary doctor to arrange a follow-up.  Also, follow-up with your oncologist as scheduled.  Return to the ER for any worsening symptoms

## 2018-07-11 NOTE — ED Triage Notes (Signed)
Pt called ems due to coughing up blood tinged sputum. Pt on chemo for lung cancer. gen weakness noted. Tachy, temp 99.9. 92 02 ra with ems. Pt on 2L [place]  With 100%. A/o.

## 2018-07-11 NOTE — ED Notes (Signed)
Pt taken to xray 

## 2018-07-11 NOTE — ED Notes (Signed)
CRITICAL VALUE ALERT  Critical Value:  WBC 0.4  Date & Time Notied:  07/11/18 1044  Provider Notified: T Tripplett  Orders Received/Actions taken:

## 2018-07-11 NOTE — ED Notes (Signed)
Silva Bandy, sister [phone]

## 2018-07-14 ENCOUNTER — Telehealth (HOSPITAL_COMMUNITY): Payer: Self-pay | Admitting: *Deleted

## 2018-07-14 NOTE — Telephone Encounter (Signed)
Patient had 2 refills left on the magic mouthwash. Called and reminded him. He states that he called the pharmacy and they told him. He also states the mouthwash is not working as well as the magic mouthwash he has had previous times. Explained to patient that D.K's formulation for magic mouthwash is a little different than what he had previously. Pt has appt on Tuesday and states he will talk to Dr. Raliegh Ip about it then. He also wants to know if this sore throat is going to happen every time he gets chemo.

## 2018-07-15 ENCOUNTER — Other Ambulatory Visit: Payer: Self-pay

## 2018-07-15 ENCOUNTER — Emergency Department (HOSPITAL_COMMUNITY): Payer: Medicare Other

## 2018-07-15 ENCOUNTER — Inpatient Hospital Stay (HOSPITAL_COMMUNITY)
Admission: EM | Admit: 2018-07-15 | Discharge: 2018-07-18 | DRG: 808 | Disposition: A | Discharge status: Home Health | Payer: Medicare Other | Attending: Internal Medicine | Admitting: Internal Medicine

## 2018-07-15 ENCOUNTER — Encounter (HOSPITAL_COMMUNITY): Payer: Self-pay | Admitting: Emergency Medicine

## 2018-07-15 DIAGNOSIS — J9621 Acute and chronic respiratory failure with hypoxia: Secondary | ICD-10-CM | POA: Diagnosis present

## 2018-07-15 DIAGNOSIS — Z9221 Personal history of antineoplastic chemotherapy: Secondary | ICD-10-CM

## 2018-07-15 DIAGNOSIS — G893 Neoplasm related pain (acute) (chronic): Secondary | ICD-10-CM | POA: Diagnosis present

## 2018-07-15 DIAGNOSIS — Z7951 Long term (current) use of inhaled steroids: Secondary | ICD-10-CM

## 2018-07-15 DIAGNOSIS — R0603 Acute respiratory distress: Secondary | ICD-10-CM

## 2018-07-15 DIAGNOSIS — Z86711 Personal history of pulmonary embolism: Secondary | ICD-10-CM | POA: Diagnosis present

## 2018-07-15 DIAGNOSIS — K219 Gastro-esophageal reflux disease without esophagitis: Secondary | ICD-10-CM | POA: Diagnosis present

## 2018-07-15 DIAGNOSIS — J449 Chronic obstructive pulmonary disease, unspecified: Secondary | ICD-10-CM | POA: Diagnosis present

## 2018-07-15 DIAGNOSIS — Z87891 Personal history of nicotine dependence: Secondary | ICD-10-CM

## 2018-07-15 DIAGNOSIS — F418 Other specified anxiety disorders: Secondary | ICD-10-CM | POA: Diagnosis present

## 2018-07-15 DIAGNOSIS — Z79899 Other long term (current) drug therapy: Secondary | ICD-10-CM

## 2018-07-15 DIAGNOSIS — T66XXXA Radiation sickness, unspecified, initial encounter: Secondary | ICD-10-CM

## 2018-07-15 DIAGNOSIS — D61810 Antineoplastic chemotherapy induced pancytopenia: Secondary | ICD-10-CM | POA: Diagnosis not present

## 2018-07-15 DIAGNOSIS — Z515 Encounter for palliative care: Secondary | ICD-10-CM | POA: Diagnosis present

## 2018-07-15 DIAGNOSIS — Z7189 Other specified counseling: Secondary | ICD-10-CM

## 2018-07-15 DIAGNOSIS — K208 Other esophagitis without bleeding: Secondary | ICD-10-CM | POA: Diagnosis present

## 2018-07-15 DIAGNOSIS — R042 Hemoptysis: Secondary | ICD-10-CM | POA: Diagnosis present

## 2018-07-15 DIAGNOSIS — T451X5A Adverse effect of antineoplastic and immunosuppressive drugs, initial encounter: Secondary | ICD-10-CM | POA: Diagnosis present

## 2018-07-15 DIAGNOSIS — Y842 Radiological procedure and radiotherapy as the cause of abnormal reaction of the patient, or of later complication, without mention of misadventure at the time of the procedure: Secondary | ICD-10-CM | POA: Diagnosis present

## 2018-07-15 DIAGNOSIS — I493 Ventricular premature depolarization: Secondary | ICD-10-CM | POA: Diagnosis not present

## 2018-07-15 DIAGNOSIS — J439 Emphysema, unspecified: Secondary | ICD-10-CM | POA: Diagnosis present

## 2018-07-15 DIAGNOSIS — Z923 Personal history of irradiation: Secondary | ICD-10-CM

## 2018-07-15 DIAGNOSIS — Z66 Do not resuscitate: Secondary | ICD-10-CM | POA: Diagnosis present

## 2018-07-15 DIAGNOSIS — I10 Essential (primary) hypertension: Secondary | ICD-10-CM | POA: Diagnosis present

## 2018-07-15 DIAGNOSIS — Z7901 Long term (current) use of anticoagulants: Secondary | ICD-10-CM

## 2018-07-15 DIAGNOSIS — Z888 Allergy status to other drugs, medicaments and biological substances status: Secondary | ICD-10-CM

## 2018-07-15 DIAGNOSIS — D61818 Other pancytopenia: Secondary | ICD-10-CM

## 2018-07-15 DIAGNOSIS — C3491 Malignant neoplasm of unspecified part of right bronchus or lung: Secondary | ICD-10-CM | POA: Diagnosis present

## 2018-07-15 LAB — URINALYSIS, ROUTINE W REFLEX MICROSCOPIC
BACTERIA UA: NONE SEEN
Bilirubin Urine: NEGATIVE
Glucose, UA: NEGATIVE mg/dL
Ketones, ur: NEGATIVE mg/dL
LEUKOCYTES UA: NEGATIVE
Nitrite: NEGATIVE
PROTEIN: NEGATIVE mg/dL
SPECIFIC GRAVITY, URINE: 1.013 (ref 1.005–1.030)
pH: 7 (ref 5.0–8.0)

## 2018-07-15 LAB — BASIC METABOLIC PANEL
Anion gap: 5 (ref 5–15)
BUN: 16 mg/dL (ref 6–20)
CHLORIDE: 102 mmol/L (ref 98–111)
CO2: 28 mmol/L (ref 22–32)
CREATININE: 0.74 mg/dL (ref 0.61–1.24)
Calcium: 8.1 mg/dL — ABNORMAL LOW (ref 8.9–10.3)
GFR calc Af Amer: >60 mL/min (ref >60)
GFR calc non Af Amer: >60 mL/min (ref >60)
Glucose, Bld: 132 mg/dL — ABNORMAL HIGH (ref 70–99)
POTASSIUM: 3.5 mmol/L (ref 3.5–5.1)
Sodium: 135 mmol/L (ref 135–145)

## 2018-07-15 LAB — CBC WITH DIFFERENTIAL/PLATELET
BASOS PCT: 0 %
Basophils Absolute: 0 10*3/uL (ref 0.0–0.1)
EOS PCT: 2 %
Eosinophils Absolute: 0 10*3/uL (ref 0.0–0.7)
HEMATOCRIT: 22.5 % — AB (ref 39.0–52.0)
Hemoglobin: 7.3 g/dL — ABNORMAL LOW (ref 13.0–17.0)
LYMPHS ABS: 0.4 10*3/uL — AB (ref 0.7–4.0)
Lymphocytes Relative: 63 %
MCH: 29.7 pg (ref 26.0–34.0)
MCHC: 32.4 g/dL (ref 30.0–36.0)
MCV: 91.5 fL (ref 78.0–100.0)
MONO ABS: 0.1 10*3/uL (ref 0.1–1.0)
MONOS PCT: 13 %
Neutro Abs: 0.2 10*3/uL — ABNORMAL LOW (ref 1.7–7.7)
Neutrophils Relative %: 22 %
PLATELETS: 8 10*3/uL — AB (ref 150–400)
RBC: 2.46 MIL/uL — AB (ref 4.22–5.81)
RDW: 16 % — AB (ref 11.5–15.5)
WBC: 0.7 10*3/uL — AB (ref 4.0–10.5)

## 2018-07-15 LAB — BLOOD GAS, ARTERIAL
Acid-Base Excess: 1.4 mmol/L (ref 0.0–2.0)
Bicarbonate: 25.5 mmol/L (ref 20.0–28.0)
Drawn by: 382351
FIO2: 100
O2 SAT: 99.1 %
PATIENT TEMPERATURE: 37
PO2 ART: 206 mmHg — AB (ref 83.0–108.0)
pCO2 arterial: 43.9 mmHg (ref 32.0–48.0)
pH, Arterial: 7.388 (ref 7.350–7.450)

## 2018-07-15 LAB — PREPARE RBC (CROSSMATCH)

## 2018-07-15 LAB — ABO/RH: ABO/RH(D): O NEG

## 2018-07-15 MED ORDER — GI COCKTAIL ~~LOC~~
30.0000 mL | Freq: Once | ORAL | Status: AC
  Administered 2018-07-15: 30 mL via ORAL
  Filled 2018-07-15: qty 30

## 2018-07-15 MED ORDER — SODIUM CHLORIDE 0.9% IV SOLUTION
Freq: Once | INTRAVENOUS | Status: AC
  Administered 2018-07-15: 21:00:00 via INTRAVENOUS

## 2018-07-15 MED ORDER — SODIUM CHLORIDE 0.9 % IV BOLUS
1000.0000 mL | Freq: Once | INTRAVENOUS | Status: AC
  Administered 2018-07-15: 1000 mL via INTRAVENOUS

## 2018-07-15 NOTE — ED Provider Notes (Addendum)
[facility] EMERGENCY DEPARTMENT Provider Note   CSN: [id number] Arrival date & time: 07/15/18  1537     History   Chief Complaint Chief Complaint  Patient presents with  . Failure To Thrive    HPI Erik [name] is a 57 y.o. male.  HPI   Patient is having trouble eating and swallowing because of sores in his mouth despite using Magic mouthwash prescribed by his oncologist.  He had a new chemotherapy regimen started, at his last treatment, 5 days ago.  He denies fever, chills, cough, shortness of breath, focal weakness or paresthesia.  He feels globally weak.  He has not eaten much in the last several days.  He is taking medications which are prescribed.    Past Medical History:  Diagnosis Date  . 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 Arthritis   . Asthma   . Chronic back pain   . COPD (chronic obstructive pulmonary disease) ([place])   . Depression   . Dyspnea    with exertion   . GERD (gastroesophageal reflux disease)   . Hypertension   . Pneumonia   . Pulmonary embolus ([place])   . Seasonal allergies   . Spinal stenosis of lumbar region   . 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 Wheezing     Patient Active Problem List   Diagnosis Date Noted  . Empyema ([place])   . Palliative care by specialist   . VRE (vancomycin-resistant Enterococci) infection 04/25/2018  . Wound infection 04/18/2018  . Malnutrition of moderate degree 03/30/2018  . Sepsis due to pneumonia ([place]) 03/23/2018  . Empyema of lung ([place]) 03/23/2018  . History of pulmonary embolism 03/23/2018  . Empyema lung ([place]) 03/23/2018  . Bronchiectasis with acute exacerbation ([place])   . Acute on chronic respiratory failure with hypoxia ([place])   . Essential hypertension 03/07/2018  . Steroid-induced hyperglycemia 03/07/2018  . COPD (chronic obstructive pulmonary disease) ([place]) 03/06/2018  . HCAP (healthcare-associated pneumonia) 01/28/2018  . Taking multiple medications for chronic disease 03/08/2017    . Radiation-induced esophagitis 02/02/2017  . Loss of weight 12/10/2016  . Goals of care, counseling/discussion 12/10/2016  . Esophageal dysphagia 12/10/2016  . Abdominal pain, epigastric 12/10/2016  . Adenocarcinoma of right lung ([place]) 07/23/2016  . GERD 01/13/2009  . ACTINIC KERATOSIS 01/13/2009  . Anxiety state 12/27/2008  . Depression with anxiety 12/27/2008  . ASTHMA 12/27/2008  . OSTEOARTHRITIS 12/27/2008  . LOW BACK PAIN 12/27/2008    Past Surgical History:  Procedure Laterality Date  . APPLICATION OF WOUND VAC  04/19/2018   Procedure: APPLICATION OF WOUND VAC;  Surgeon: [name], MD;  Location: [place];  Service: Vascular;;  . APPLICATION OF WOUND VAC Right 04/21/2018   Procedure: WOUND VAC CHANGE;  Surgeon: [name], MD;  Location: [place];  Service: Thoracic;  Laterality: Right;  . APPLICATION OF WOUND VAC N/A 04/24/2018   Procedure: WOUND VAC CHANGE;  Surgeon: [name], MD;  Location: [place];  Service: Thoracic;  Laterality: N/A;  . BACK SURGERY     5 total  . BIOPSY  01/03/2017   Procedure: BIOPSY;  Surgeon: Daneil Dolin, MD;  [facility];  Service: Endoscopy;;  gastric  . CHEST TUBE INSERTION Right 04/19/2018   Procedure: CHEST TUBE INSERTION;  Surgeon: [name], MD;  Location: [place];  Service: Vascular;  Laterality: Right;  . CHEST TUBE INSERTION Right 04/19/2018   Procedure: CHEST TUBE INSERTION;  Surgeon: Roxan Hockey,  Revonda Standard, MD;  Location: [place];  Service: Thoracic;  Laterality: Right;  . COLONOSCOPY WITH PROPOFOL N/A 04/04/2017   Procedure: COLONOSCOPY WITH PROPOFOL;  Surgeon: Daneil Dolin, MD;  [facility];  Service: Endoscopy;  Laterality: N/A;  8:45am  . DECORTICATION Right 03/28/2018   Procedure: DECORTICATION;  Surgeon: [name], MD;  Location: [facility] OR;  Service: Thoracic;  Laterality: Right;  . ESOPHAGOGASTRODUODENOSCOPY (EGD) WITH PROPOFOL N/A 01/03/2017   Procedure:  ESOPHAGOGASTRODUODENOSCOPY (EGD) WITH PROPOFOL;  Surgeon: Daneil Dolin, MD;  [facility];  Service: Endoscopy;  Laterality: N/A;  . I&D EXTREMITY Right 04/19/2018   Procedure: IRRIGATION AND DEBRIDEMENT RIGHT CHEST WOUND;  Surgeon: [name], MD;  Location: [place];  Service: Vascular;  Laterality: Right;  . MALONEY DILATION N/A 01/03/2017   Procedure: Venia Minks DILATION;  Surgeon: Daneil Dolin, MD;  [facility];  Service: Endoscopy;  Laterality: N/A;  . MULTIPLE EXTRACTIONS WITH ALVEOLOPLASTY N/A 07/08/2014   Procedure: MULTIPLE EXTRACION WITH ALVEOLOPLASTY with EXCISION LESION RIGHT SIDE OF TONGUE;  Surgeon: Gae Bon, DDS;  Location: [place];  Service: Oral Surgery;  Laterality: N/A;  . PORTACATH PLACEMENT Right 07/30/2016   Procedure: INSERTION PORT-A-CATH;  Surgeon: Vickie Epley, MD;  Location: [facility];  Service: Vascular;  Laterality: Right;  . THORACOTOMY  04/19/2018   Procedure: THORACOTOMY MAJOR;  Surgeon: [name], MD;  Location: [place];  Service: Vascular;;  . VIDEO ASSISTED THORACOSCOPY Right 04/21/2018   Procedure: VIDEO ASSISTED THORACOSCOPY;  Surgeon: [name], MD;  Location: [place];  Service: Thoracic;  Laterality: Right;  [name] VIDEO ASSISTED THORACOSCOPY Right 04/24/2018   Procedure: VIDEO ASSISTED THORACOSCOPY;  Surgeon: [name], MD;  Location: [place];  Service: Thoracic;  Laterality: Right;  [name] VIDEO ASSISTED THORACOSCOPY (VATS)/EMPYEMA Right 03/28/2018   Procedure: VIDEO ASSISTED THORACOSCOPY (VATS)/EMPYEMA;  Surgeon: [name], MD;  Location: [place];  Service: Thoracic;  Laterality: Right;  [name] VIDEO BRONCHOSCOPY WITH ENDOBRONCHIAL ULTRASOUND N/A 07/19/2016   Procedure: VIDEO BRONCHOSCOPY WITH ENDOBRONCHIAL ULTRASOUND;  Surgeon: [name], MD;  Location: [place];  Service: Thoracic;  Laterality: N/A;        Home Medications    Prior to Admission medications   Medication Sig Start Date  End Date Taking? Authorizing Provider  acetaminophen (TYLENOL) 325 MG tablet Take 2 tablets (650 mg total) by mouth every 6 (six) hours as needed for mild pain (or Fever >/= 101). 05/01/18  Yes Barrett, Erin R, PA-C  albuterol (PROVENTIL HFA;VENTOLIN HFA) 108 (90 Base) MCG/ACT inhaler Inhale 2 puffs into the lungs every 6 (six) hours as needed for wheezing or shortness of breath. 06/28/18  Yes Lockamy, Randi L, NP-C  albuterol (PROVENTIL) (2.5 MG/3ML) 0.083% nebulizer solution Take 3 mLs (2.5 mg total) by nebulization every 6 (six) hours as needed for wheezing or shortness of breath (if unable to experience relief by using combivent inhaler). 03/08/18  Yes Barton Dubois, MD  ALPRAZolam Duanne Moron) 1 MG tablet Take 1 mg by mouth 2 (two) times daily as needed for anxiety or sleep (takes 1 tablet and bedtime for sleep and 1 dose during the day only if needed).  06/26/16  Yes [provider]  amLODipine (NORVASC) 5 MG tablet Take 5 mg by mouth daily.  05/17/18  Yes [provider]  apixaban (ELIQUIS) 5 MG TABS tablet Take 1 tablet (5 mg total) by mouth 2 (two) times daily. 06/14/18  Yes Francene Finders  L, NP-C  Artificial Tear Solution (SOOTHE XP) SOLN Apply 2 drops to eye daily.   Yes [provider]  escitalopram (LEXAPRO) 20 MG tablet Take 20 mg by mouth daily.  05/10/18  Yes [provider]  folic acid (FOLVITE) 1 MG tablet Take 1 tablet (1 mg total) by mouth daily. 06/27/18  Yes Lockamy, Randi L, NP-C  furosemide (LASIX) 20 MG tablet Take 40 mg by mouth daily.   Yes [provider]  gabapentin (NEURONTIN) 600 MG tablet Take 600 mg by mouth 2 (two) times daily.  08/19/17  Yes [provider]  guaiFENesin (MUCINEX) 600 MG 12 hr tablet Take 600 mg by mouth 2 (two) times daily as needed (for congestion.).   Yes [provider]  Ipratropium-Albuterol (COMBIVENT) 20-100 MCG/ACT AERS respimat Inhale 1 puff into the lungs every 6 (six) hours as needed for  wheezing or shortness of breath. 03/08/18  Yes Barton Dubois, MD  lactulose (CHRONULAC) 10 GM/15ML solution Take 15 mLs (10 g total) by mouth 3 (three) times daily as needed for mild constipation or moderate constipation. 01/23/18  Yes Burns, Wandra Feinstein, NP  levalbuterol Penne Lash) 0.63 MG/3ML nebulizer solution Take 3 mLs (0.63 mg total) by nebulization every 8 (eight) hours as needed for wheezing or shortness of breath. 05/01/18  Yes Barrett, Erin R, PA-C  methadone (DOLOPHINE) 10 MG tablet Take 20 mg by mouth 5 (five) times daily. Takes 2 tablets 5 times daily 06/26/16  Yes [provider]  mirtazapine (REMERON) 15 MG tablet Take 1 tablet by mouth at bedtime. 08/17/17  Yes [provider]  Multiple Vitamin (MULTIVITAMIN WITH MINERALS) TABS tablet Take 1 tablet by mouth daily. Centrum   Yes [provider]  ondansetron (ZOFRAN) 8 MG tablet TAKE 1 TABLET BY MOUTH EVERY 8 HOURS AS NEEDED FOR NAUSEA AND VOMITING. Patient taking differently: Take 8 mg by mouth every 8 (eight) hours as needed. TAKE 1 TABLET BY MOUTH EVERY 8 HOURS AS NEEDED FOR NAUSEA AND VOMITING. 03/02/18  Yes Holley Bouche, NP  OXYGEN Inhale 3 L into the lungs daily.    Yes [provider]  pantoprazole (PROTONIX) 40 MG tablet Take 1 tablet (40 mg total) by mouth 2 (two) times daily. 06/14/18  Yes Lockamy, Randi L, NP-C  PEMEtrexed Disodium (ALIMTA IV) Inject into the vein as directed.    Yes [provider]  potassium chloride SA (K-DUR,KLOR-CON) 20 MEQ tablet Take 1 tablet (20 mEq total) by mouth daily. 03/14/18  Yes Derek Jack, MD  prochlorperazine (COMPAZINE) 10 MG tablet Take 1 tablet (10 mg total) by mouth every 6 (six) hours as needed (Nausea or vomiting). 06/28/18  Yes Derek Jack, MD  STIOLTO RESPIMAT 2.5-2.5 MCG/ACT AERS Inhale 1 puff into the lungs 3 (three) times daily. 07/04/18  Yes [provider]  tiZANidine (ZANAFLEX) 4 MG tablet Take 4 mg by mouth every 8  (eight) hours as needed for muscle spasms.  06/03/16  Yes [provider]  apixaban (ELIQUIS) 5 MG TABS tablet Take 1 tablet (5 mg total) by mouth 2 (two) times daily. Patient not taking: Reported on 07/11/2018 06/14/18   Francene Finders L, NP-C  CARBOPLATIN IV Inject into the vein.    [provider]  loperamide (IMODIUM) 2 MG capsule Take 1 capsule (2 mg total) by mouth as needed for diarrhea or loose stools. Patient not taking: Reported on 07/04/2018 04/08/18   Patrecia Pour, MD  Misc. Devices MISC POC at 2 lpm via  [place] with conserver / pulse ox on exertion. 05/03/18   Higgs, Mathis Dad, MD  sucralfate (CARAFATE) 1 GM/10ML suspension carafate 1gm/69ml & viscous Lidocaine 2% 1:1 mixture.  Swish and swallow 1 tablespoon four times a day. Patient not taking: Reported on 07/15/2018 07/10/18   Derek Jack, MD  varenicline (CHANTIX) 0.5 MG tablet TAKE (2) TABLETS BY MOUTH TWICE DAILY. Patient not taking: Reported on 07/11/2018 04/11/18   Holley Bouche, NP    Family History Family History  Problem Relation Age of Onset  . Cancer Mother        breast cancer  . Heart failure Brother   . Colon cancer Neg Hx        not sure, ?grandfather and/or uncle    Social History Social History   Tobacco Use  . Smoking status: Former Smoker    Packs/day: 0.25    Years: 38.00    Pack years: 9.50    Types: Cigarettes    Last attempt to quit: 02/16/2018    Years since quitting: 0.4  . Smokeless tobacco: Former Systems developer    Types: Chew  . Tobacco comment: [place] STAY  Substance Use Topics  . Alcohol use: Yes    Alcohol/week: 2.0 standard drinks    Types: 2 Cans of beer per week    Comment: reports drinking 0-2 beers a week  . Drug use: Yes    Frequency: 7.0 times per week    Types: Marijuana    Comment: daily for cancer; pt denies any use at present time 08/23/17     Allergies   Demeclocycline and Tetracyclines & related   Review of Systems Review of Systems  All  other systems reviewed and are negative.    Physical Exam Updated Vital Signs BP (!) 167/76   Pulse 69   Temp 98.1 F (36.7 C)   Resp (!) 34   Ht 6' (1.829 m)   Wt 99.8 kg   SpO2 100%   BMI 29.84 kg/m   Physical Exam  Constitutional: He is oriented to person, place, and time. He appears well-developed. He appears distressed (Uncomfortable).  Frail, ill-appearing  HENT:  Head: Normocephalic and atraumatic.  Right Ear: External ear normal.  Left Ear: External ear normal.  Eyes: Pupils are equal, round, and reactive to light. Conjunctivae and EOM are normal.  Neck: Normal range of motion and phonation normal. Neck supple.  Cardiovascular: Regular rhythm and normal heart sounds.  Tachypneic  Pulmonary/Chest: Effort normal and breath sounds normal. No stridor. No respiratory distress. He exhibits no bony tenderness.  Abdominal: Soft. There is no tenderness.  Musculoskeletal: Normal range of motion.  Neurological: He is alert and oriented to person, place, and time. No cranial nerve deficit or sensory deficit. He exhibits normal muscle tone. Coordination normal.  Skin: Skin is warm, dry and intact.  Scattered eccymosis  Psychiatric: He has a normal mood and affect. His behavior is normal. Judgment and thought content normal.  Nursing note and vitals reviewed.    ED Treatments / Results  Labs (all labs ordered are listed, but only abnormal results are displayed) Labs Reviewed  BASIC METABOLIC PANEL - Abnormal; Notable for the following components:      Result Value   Glucose, Bld 132 (*)    Calcium 8.1 (*)    All other components within normal limits  CBC WITH DIFFERENTIAL/PLATELET - Abnormal; Notable for the following components:   WBC 0.7 (*)    RBC 2.46 (*)  Hemoglobin 7.3 (*)    HCT 22.5 (*)    RDW 16.0 (*)    Platelets 8 (*)    Neutro Abs 0.2 (*)    Lymphs Abs 0.4 (*)    All other components within normal limits  URINALYSIS, ROUTINE W REFLEX MICROSCOPIC -  Abnormal; Notable for the following components:   Hgb urine dipstick MODERATE (*)    RBC / HPF >50 (*)    All other components within normal limits  BLOOD GAS, ARTERIAL - Abnormal; Notable for the following components:   pO2, Arterial 206 (*)    All other components within normal limits  TYPE AND SCREEN  PREPARE RBC (CROSSMATCH)  PREPARE PLATELET PHERESIS  ABO/RH    EKG EKG Interpretation  Date/Time:  Saturday July 15 2018 15:43:34 EDT Ventricular Rate:  127 PR Interval:    QRS Duration: 92 QT Interval:  310 QTC Calculation: 451 R Axis:   47 Text Interpretation:  Sinus tachycardia Multiform ventricular premature complexes Abnormal R-wave progression, early transition Baseline wander in lead(s) V2 V4 No STEMI.  Confirmed by Nanda Quinton [phone]) on 07/15/2018 4:14:57 PM   Radiology Dg Chest 2 View  Result Date: 07/15/2018 CLINICAL DATA:  Poor p.o. intake due to chemotherapy for lung cancer. EXAM: CHEST - 2 VIEW COMPARISON:  July 11, 2018 chest x-ray FINDINGS: There is near complete opacification of the right chest, unchanged. The right Port-A-Cath is stable. There is relative volume loss on the right. No pneumothorax. The left lung is clear. Visualized cardiomediastinal silhouette is stable. No other interval changes. IMPRESSION: No significant interval change. Near complete opacification of the right chest with volume loss. Electronically Signed   By: Dorise Bullion III M.D   On: 07/15/2018 16:59   Dg Chest Port 1 View  Result Date: 07/15/2018 CLINICAL DATA:  57 y/o M; increased difficulty of breathing and decreased oxygen saturation. History of lung cancer. EXAM: PORTABLE CHEST 1 VIEW COMPARISON:  07/15/2018 chest radiograph. FINDINGS: Stable near complete opacification of the right hemithorax with volume loss and clear left lung given projection and technique right port catheter tip projects over lower SVC. No left-sided effusion. Stable cardiac silhouette with stable rightward  mediastinal deviation from volume loss in the right hemithorax. No acute osseous abnormality is evident. IMPRESSION: Stable near complete opacification of right hemithorax with volume loss and clear left lung given projection and technique. Electronically Signed   By: Kristine Garbe M.D.   On: 07/15/2018 23:23    Procedures .Critical Care Performed by: Daleen Bo, MD Authorized by: Daleen Bo, MD   Critical care provider statement:    Critical care time (minutes):  25   Critical care start time:  07/15/2018 4:00 PM   Critical care end time:  07/15/2018 11:25 PM   Critical care time was exclusive of:  Separately billable procedures and treating other patients   Critical care was necessary to treat or prevent imminent or life-threatening deterioration of the following conditions:  Respiratory failure (Pancytopenia)   Critical care was time spent personally by me on the following activities:  Blood draw for specimens, development of treatment plan with patient or surrogate, discussions with consultants, evaluation of patient's response to treatment, examination of patient, obtaining history from patient or surrogate, ordering and performing treatments and interventions, ordering and review of laboratory studies, pulse oximetry, re-evaluation of patient's condition, review of old charts and ordering and review of radiographic studies   (including critical care time)  Medications Ordered in ED Medications  sodium  chloride 0.9 % bolus 1,000 mL (0 mLs Intravenous Stopped 07/15/18 1736)  gi cocktail (Maalox,Lidocaine,Donnatal) (30 mLs Oral Given 07/15/18 1652)  0.9 %  sodium chloride infusion (Manually program via Guardrails IV Fluids) ( Intravenous Stopped 07/15/18 2100)  gi cocktail (Maalox,Lidocaine,Donnatal) (30 mLs Oral Given 07/15/18 2047)     Initial Impression / Assessment and Plan / ED Course  I have reviewed the triage vital signs and the nursing notes.  Pertinent labs &  imaging results that were available during my care of the patient were reviewed by me and considered in my medical decision making (see chart for details).  Clinical Course as of Jul 15 2328  Sat Jul 15, 2018  1852 Normal except hemoglobin and RBC present  Urinalysis, Routine w reflex microscopic(!) [EW]  1853 Abnormal; white count 0.7, neutrophils 200, platelets 8 hemoglobin 7.3  CBC with Differential(!!) [EW]  1854 Normal except glucose high, calcium low  Basic metabolic panel(!) [EW]  1027 No change from prior chest x-ray, nearly complete opacification of right chest from tumor.  DG Chest 2 View [EW]  2313 Patient was drinking water and had sudden onset of shortness of breath and "sounded like he was gurgling," according to family members with him.  Shortly after that he coughed mucus with blood in it.  Oxygen saturation dropped to 80s and improved to 100% on facemask oxygen.  Patient did not change mental status during this episode, I was at the bedside with him.  He felt more comfortable on facemask oxygen.  Blood infusion has completed.  Chest x-ray has been done and does not show any worsening failure or infiltrate.  There is no evident pneumothorax.   [EW]  2329 Ventricular ectopy noted on cardiac monitor, possible 4 beat run of V. tach.  Patient did not have change in mental status at this time.   [EW]    Clinical Course User Index [EW] Daleen Bo, MD     Patient Vitals for the past 24 hrs:  BP Temp Temp src Pulse Resp SpO2 Height Weight  07/15/18 2326 - - Oral - - - - -  07/15/18 2315 (!) 167/76 - - 69 (!) 34 100 % - -  07/15/18 2300 (!) 175/113 98.1 F (36.7 C) - (!) 129 (!) 42 (!) 85 % - -  07/15/18 2245 132/90 - - (!) 129 (!) 28 96 % - -  07/15/18 2230 120/74 - - (!) 126 (!) 26 99 % - -  07/15/18 2215 (!) 110/58 - - (!) 118 (!) 28 97 % - -  07/15/18 2200 (!) 109/55 - - (!) 123 19 96 % - -  07/15/18 2145 103/75 - - (!) 128 (!) 27 97 % - -  07/15/18 2130 (!) 158/69 - -  (!) 131 (!) 29 94 % - -  07/15/18 2115 127/72 - - (!) 129 (!) 21 99 % - -  07/15/18 2100 127/66 - - (!) 126 (!) 23 96 % - -  07/15/18 2055 - 97.8 F (36.6 C) Oral - - - - -  07/15/18 2044 127/75 - - (!) 128 (!) 29 99 % - -  07/15/18 2015 - - - (!) 126 (!) 25 98 % - -  07/15/18 2000 123/78 - - (!) 127 (!) 30 97 % - -  07/15/18 1930 (!) 123/58 - - (!) 123 20 100 % - -  07/15/18 1830 132/73 - - - 19 - - -  07/15/18 1800 118/68 - - [name] [name])  26 - - -  07/15/18 1730 127/62 - - - (!) 25 - - -  07/15/18 1700 121/62 - - (!) 126 (!) 25 99 % - -  07/15/18 1630 117/64 - - (!) 122 19 98 % - -  07/15/18 1600 129/66 - - 66 20 97 % - -  07/15/18 1541 - - - - - - 6' (1.829 m) 99.8 kg  07/15/18 1540 111/62 99.9 F (37.7 C) Oral (!) 130 (!) 27 - - -  07/15/18 1539 - - - - - 94 % - -      Medical Decision Making: Patient presenting with difficulty swallowing, malaise and poor oral intake for several days, following new chemotherapy agent.  Patient with pancytopenia, worsening platelet count today.  No fever or overt infection.  Initial plan was to transfuse with packed red cells, and platelets then consider discharging.  Patient developed sudden shortness of breath after drinking water and had hemoptysis afterwards.  Respiratory distress and hypoxia improved with facemask oxygen.  Repeat chest x-ray did not show new failure or pneumothorax.  Patient has significant worsening status with cancer, recently, necessitating new chemotherapy.  He appears to be a candidate for palliative care consultation.  CRITICAL CARE-yes Performed by: Daleen Bo   Nursing Notes Reviewed/ Care Coordinated Applicable Imaging Reviewed Interpretation of Laboratory Data incorporated into ED treatment   11:28 PM-Consult complete with hospitalist. Patient case explained and discussed.  He agrees to admit patient for further evaluation and treatment. Call ended at 11:38 PM  Plan: Admit    Final Clinical Impressions(s) / ED  Diagnoses   Final diagnoses:  Pancytopenia ([place])  Respiratory distress  PVC (premature ventricular contraction)    ED Discharge Orders    None       Daleen Bo, MD 07/15/18 0998    Daleen Bo, MD 07/24/18 [phone]

## 2018-07-15 NOTE — ED Triage Notes (Signed)
Pt brought in by EMS due to poor po intake due to chemo for lung cancer. States last treatment last Tuesday. Reports food will not go down

## 2018-07-15 NOTE — ED Notes (Signed)
Pt having difficulty breathing and O2 sats dropped to 70's. Dr Eulis Foster notified and new orders given. Pt placed on 100% NRB and sats up to 100%.

## 2018-07-15 NOTE — ED Notes (Signed)
Pt tolerating pepsi. EDP aware

## 2018-07-15 NOTE — ED Notes (Signed)
Pt requesting Pepsi to drink

## 2018-07-15 NOTE — ED Notes (Signed)
Pt had run of V-tach, captured on alarm monitor, shown to Dr Eulis Foster.  Informed Dr Eulis Foster that pt respiratory rate is 38-40. Questioned whether to start platelet infusion- Dr Eulis Foster instructed to continue with planned infusion of platelets. Ginger, RN aware.

## 2018-07-15 NOTE — ED Notes (Signed)
Date and time results received: 07/15/18 1645 (use smartphrase ".now" to insert current time)  Test: wbc, plt Critical Value: 0.7, 8  Name of Provider Notified: wentz  Orders Received? Or Actions Taken?: none

## 2018-07-16 ENCOUNTER — Encounter (HOSPITAL_COMMUNITY): Payer: Self-pay | Admitting: Emergency Medicine

## 2018-07-16 ENCOUNTER — Other Ambulatory Visit: Payer: Self-pay

## 2018-07-16 DIAGNOSIS — Z515 Encounter for palliative care: Secondary | ICD-10-CM | POA: Diagnosis not present

## 2018-07-16 DIAGNOSIS — K208 Other esophagitis: Secondary | ICD-10-CM | POA: Diagnosis present

## 2018-07-16 DIAGNOSIS — Z87891 Personal history of nicotine dependence: Secondary | ICD-10-CM | POA: Diagnosis not present

## 2018-07-16 DIAGNOSIS — Z888 Allergy status to other drugs, medicaments and biological substances status: Secondary | ICD-10-CM | POA: Diagnosis not present

## 2018-07-16 DIAGNOSIS — J9621 Acute and chronic respiratory failure with hypoxia: Secondary | ICD-10-CM | POA: Diagnosis present

## 2018-07-16 DIAGNOSIS — R042 Hemoptysis: Secondary | ICD-10-CM | POA: Diagnosis present

## 2018-07-16 DIAGNOSIS — Z9221 Personal history of antineoplastic chemotherapy: Secondary | ICD-10-CM | POA: Diagnosis not present

## 2018-07-16 DIAGNOSIS — Z86711 Personal history of pulmonary embolism: Secondary | ICD-10-CM | POA: Diagnosis not present

## 2018-07-16 DIAGNOSIS — Z79899 Other long term (current) drug therapy: Secondary | ICD-10-CM | POA: Diagnosis not present

## 2018-07-16 DIAGNOSIS — F418 Other specified anxiety disorders: Secondary | ICD-10-CM | POA: Diagnosis present

## 2018-07-16 DIAGNOSIS — J449 Chronic obstructive pulmonary disease, unspecified: Secondary | ICD-10-CM

## 2018-07-16 DIAGNOSIS — D61810 Antineoplastic chemotherapy induced pancytopenia: Secondary | ICD-10-CM | POA: Diagnosis present

## 2018-07-16 DIAGNOSIS — Z7951 Long term (current) use of inhaled steroids: Secondary | ICD-10-CM | POA: Diagnosis not present

## 2018-07-16 DIAGNOSIS — Z66 Do not resuscitate: Secondary | ICD-10-CM | POA: Diagnosis present

## 2018-07-16 DIAGNOSIS — Z923 Personal history of irradiation: Secondary | ICD-10-CM | POA: Diagnosis not present

## 2018-07-16 DIAGNOSIS — G893 Neoplasm related pain (acute) (chronic): Secondary | ICD-10-CM | POA: Diagnosis present

## 2018-07-16 DIAGNOSIS — C3491 Malignant neoplasm of unspecified part of right bronchus or lung: Secondary | ICD-10-CM | POA: Diagnosis present

## 2018-07-16 DIAGNOSIS — C799 Secondary malignant neoplasm of unspecified site: Secondary | ICD-10-CM | POA: Diagnosis not present

## 2018-07-16 DIAGNOSIS — I493 Ventricular premature depolarization: Secondary | ICD-10-CM | POA: Diagnosis present

## 2018-07-16 DIAGNOSIS — Y842 Radiological procedure and radiotherapy as the cause of abnormal reaction of the patient, or of later complication, without mention of misadventure at the time of the procedure: Secondary | ICD-10-CM | POA: Diagnosis present

## 2018-07-16 DIAGNOSIS — K219 Gastro-esophageal reflux disease without esophagitis: Secondary | ICD-10-CM | POA: Diagnosis present

## 2018-07-16 DIAGNOSIS — I10 Essential (primary) hypertension: Secondary | ICD-10-CM | POA: Diagnosis present

## 2018-07-16 DIAGNOSIS — Z7189 Other specified counseling: Secondary | ICD-10-CM | POA: Diagnosis not present

## 2018-07-16 DIAGNOSIS — Z7901 Long term (current) use of anticoagulants: Secondary | ICD-10-CM | POA: Diagnosis not present

## 2018-07-16 DIAGNOSIS — J439 Emphysema, unspecified: Secondary | ICD-10-CM | POA: Diagnosis present

## 2018-07-16 DIAGNOSIS — T451X5A Adverse effect of antineoplastic and immunosuppressive drugs, initial encounter: Secondary | ICD-10-CM | POA: Diagnosis present

## 2018-07-16 LAB — BASIC METABOLIC PANEL
Anion gap: 6 (ref 5–15)
BUN: 21 mg/dL — ABNORMAL HIGH (ref 6–20)
CALCIUM: 7.8 mg/dL — AB (ref 8.9–10.3)
CO2: 26 mmol/L (ref 22–32)
Chloride: 103 mmol/L (ref 98–111)
Creatinine, Ser: 0.65 mg/dL (ref 0.61–1.24)
GFR calc non Af Amer: >60 mL/min (ref >60)
Glucose, Bld: 156 mg/dL — ABNORMAL HIGH (ref 70–99)
Potassium: 3.6 mmol/L (ref 3.5–5.1)
Sodium: 135 mmol/L (ref 135–145)

## 2018-07-16 LAB — PREPARE RBC (CROSSMATCH)

## 2018-07-16 LAB — CBC
HCT: 20.7 % — ABNORMAL LOW (ref 39.0–52.0)
HCT: 22 % — ABNORMAL LOW (ref 39.0–52.0)
Hemoglobin: 6.8 g/dL — CL (ref 13.0–17.0)
Hemoglobin: 7.1 g/dL — ABNORMAL LOW (ref 13.0–17.0)
MCH: 30 pg (ref 26.0–34.0)
MCH: 29.2 pg (ref 26.0–34.0)
MCHC: 32.9 g/dL (ref 30.0–36.0)
MCHC: 32.3 g/dL (ref 30.0–36.0)
MCV: 91.2 fL (ref 78.0–100.0)
MCV: 90.5 fL (ref 78.0–100.0)
PLATELETS: 13 10*3/uL — AB (ref 150–400)
PLATELETS: 16 10*3/uL — AB (ref 150–400)
RBC: 2.27 MIL/uL — ABNORMAL LOW (ref 4.22–5.81)
RBC: 2.43 MIL/uL — ABNORMAL LOW (ref 4.22–5.81)
RDW: 15.7 % — ABNORMAL HIGH (ref 11.5–15.5)
RDW: 16 % — AB (ref 11.5–15.5)
WBC: 0.9 10*3/uL — CL (ref 4.0–10.5)
WBC: 0.9 10*3/uL — AB (ref 4.0–10.5)

## 2018-07-16 LAB — PREPARE PLATELET PHERESIS: UNIT DIVISION: 0

## 2018-07-16 LAB — BPAM PLATELET PHERESIS
Blood Product Expiration Date: 201908102359
ISSUE DATE / TIME: 201908102323
Unit Type and Rh: 5100

## 2018-07-16 LAB — MRSA PCR SCREENING: MRSA by PCR: NEGATIVE

## 2018-07-16 LAB — GLUCOSE, CAPILLARY: GLUCOSE-CAPILLARY: 116 mg/dL — AB (ref 70–99)

## 2018-07-16 MED ORDER — UMECLIDINIUM BROMIDE 62.5 MCG/INH IN AEPB
1.0000 | INHALATION_SPRAY | Freq: Every day | RESPIRATORY_TRACT | Status: DC
  Administered 2018-07-17 – 2018-07-18 (×2): 1 via RESPIRATORY_TRACT
  Filled 2018-07-16: qty 7

## 2018-07-16 MED ORDER — ADULT MULTIVITAMIN W/MINERALS CH
1.0000 | ORAL_TABLET | Freq: Every day | ORAL | Status: DC
  Administered 2018-07-16 – 2018-07-17 (×2): 1 via ORAL
  Filled 2018-07-16 (×2): qty 1

## 2018-07-16 MED ORDER — BISACODYL 5 MG PO TBEC: 5.0000 mg | DELAYED_RELEASE_TABLET | Freq: Every day | ORAL | Status: DC | PRN

## 2018-07-16 MED ORDER — ONDANSETRON HCL 4 MG/2ML IJ SOLN: 4.0000 mg | Freq: Four times a day (QID) | INTRAMUSCULAR | Status: DC | PRN

## 2018-07-16 MED ORDER — MAGIC MOUTHWASH
15.0000 mL | Freq: Four times a day (QID) | ORAL | Status: DC
  Administered 2018-07-16 – 2018-07-18 (×8): 15 mL via ORAL
  Filled 2018-07-16 (×8): qty 15

## 2018-07-16 MED ORDER — TIZANIDINE HCL 2 MG PO TABS
4.0000 mg | ORAL_TABLET | Freq: Three times a day (TID) | ORAL | Status: DC | PRN
  Filled 2018-07-16: qty 1

## 2018-07-16 MED ORDER — ALBUTEROL SULFATE (2.5 MG/3ML) 0.083% IN NEBU
2.5000 mg | INHALATION_SOLUTION | RESPIRATORY_TRACT | Status: DC | PRN
Start: 2018-07-15 — End: 2018-07-18
  Administered 2018-07-16 – 2018-07-17 (×3): 2.5 mg via RESPIRATORY_TRACT
  Filled 2018-07-16 (×3): qty 3

## 2018-07-16 MED ORDER — ALPRAZOLAM 1 MG PO TABS
1.0000 mg | ORAL_TABLET | Freq: Two times a day (BID) | ORAL | Status: DC | PRN
  Administered 2018-07-16 (×3): 1 mg via ORAL
  Filled 2018-07-16: qty 4
  Filled 2018-07-16: qty 2
  Filled 2018-07-16: qty 4

## 2018-07-16 MED ORDER — MIRTAZAPINE 15 MG PO TABS
15.0000 mg | ORAL_TABLET | Freq: Every day | ORAL | Status: DC
  Administered 2018-07-16 – 2018-07-17 (×2): 15 mg via ORAL
  Filled 2018-07-16 (×2): qty 1

## 2018-07-16 MED ORDER — POLYVINYL ALCOHOL 1.4 % OP SOLN
2.0000 [drp] | Freq: Every day | OPHTHALMIC | Status: DC
  Administered 2018-07-16 – 2018-07-18 (×2): 2 [drp] via OPHTHALMIC
  Filled 2018-07-16 (×2): qty 15

## 2018-07-16 MED ORDER — ACETAMINOPHEN 650 MG RE SUPP: 650.0000 mg | Freq: Four times a day (QID) | RECTAL | Status: DC | PRN

## 2018-07-16 MED ORDER — MAGIC MOUTHWASH W/LIDOCAINE
15.0000 mL | Freq: Four times a day (QID) | ORAL | Status: DC
  Filled 2018-07-16 (×5): qty 15

## 2018-07-16 MED ORDER — MORPHINE SULFATE (PF) 2 MG/ML IV SOLN: 2.0000 mg | INTRAVENOUS | Status: DC | PRN

## 2018-07-16 MED ORDER — SUCRALFATE 1 GM/10ML PO SUSP
1.0000 g | Freq: Four times a day (QID) | ORAL | Status: DC
  Administered 2018-07-16 – 2018-07-18 (×8): 1 g via ORAL
  Filled 2018-07-16 (×9): qty 10

## 2018-07-16 MED ORDER — METHADONE HCL 10 MG PO TABS
20.0000 mg | ORAL_TABLET | Freq: Every day | ORAL | Status: DC
  Administered 2018-07-16 – 2018-07-18 (×12): 20 mg via ORAL
  Filled 2018-07-16 (×12): qty 2

## 2018-07-16 MED ORDER — ESCITALOPRAM OXALATE 10 MG PO TABS
20.0000 mg | ORAL_TABLET | Freq: Every day | ORAL | Status: DC
  Administered 2018-07-16 – 2018-07-18 (×3): 20 mg via ORAL
  Filled 2018-07-16 (×3): qty 2
  Filled 2018-07-16 (×2): qty 1

## 2018-07-16 MED ORDER — TIOTROPIUM BROMIDE MONOHYDRATE 18 MCG IN CAPS
18.0000 ug | ORAL_CAPSULE | Freq: Every day | RESPIRATORY_TRACT | Status: DC
  Filled 2018-07-16: qty 5

## 2018-07-16 MED ORDER — FAMOTIDINE IN NACL 20-0.9 MG/50ML-% IV SOLN
20.0000 mg | Freq: Two times a day (BID) | INTRAVENOUS | Status: DC
  Administered 2018-07-16 – 2018-07-18 (×5): 20 mg via INTRAVENOUS
  Filled 2018-07-16 (×5): qty 50

## 2018-07-16 MED ORDER — ACETAMINOPHEN 325 MG PO TABS: 650.0000 mg | ORAL_TABLET | Freq: Four times a day (QID) | ORAL | Status: DC | PRN

## 2018-07-16 MED ORDER — FOLIC ACID 1 MG PO TABS
1.0000 mg | ORAL_TABLET | Freq: Every day | ORAL | Status: DC
  Administered 2018-07-16 – 2018-07-18 (×3): 1 mg via ORAL
  Filled 2018-07-16 (×3): qty 1

## 2018-07-16 MED ORDER — LIDOCAINE VISCOUS HCL 2 % MT SOLN: 15.0000 mL | Freq: Four times a day (QID) | OROMUCOSAL | Status: DC

## 2018-07-16 MED ORDER — SODIUM CHLORIDE 0.9% FLUSH
3.0000 mL | Freq: Two times a day (BID) | INTRAVENOUS | Status: DC
  Administered 2018-07-16 – 2018-07-18 (×5): 3 mL via INTRAVENOUS

## 2018-07-16 MED ORDER — MAGIC MOUTHWASH: 15.0000 mL | Freq: Four times a day (QID) | ORAL | Status: DC

## 2018-07-16 MED ORDER — PANTOPRAZOLE SODIUM 40 MG PO TBEC
40.0000 mg | DELAYED_RELEASE_TABLET | Freq: Two times a day (BID) | ORAL | Status: DC
  Administered 2018-07-16 – 2018-07-18 (×5): 40 mg via ORAL
  Filled 2018-07-16 (×5): qty 1

## 2018-07-16 MED ORDER — ONDANSETRON HCL 4 MG PO TABS: 4.0000 mg | ORAL_TABLET | Freq: Four times a day (QID) | ORAL | Status: DC | PRN

## 2018-07-16 MED ORDER — GABAPENTIN 300 MG PO CAPS
600.0000 mg | ORAL_CAPSULE | Freq: Two times a day (BID) | ORAL | Status: DC
  Administered 2018-07-16 – 2018-07-18 (×5): 600 mg via ORAL
  Filled 2018-07-16 (×5): qty 2

## 2018-07-16 MED ORDER — POTASSIUM CHLORIDE CRYS ER 20 MEQ PO TBCR
20.0000 meq | EXTENDED_RELEASE_TABLET | Freq: Every day | ORAL | Status: DC
Start: 2018-07-16 — End: 2018-07-18
  Administered 2018-07-16 – 2018-07-18 (×3): 20 meq via ORAL
  Filled 2018-07-16 (×3): qty 1

## 2018-07-16 MED ORDER — ARFORMOTEROL TARTRATE 15 MCG/2ML IN NEBU
15.0000 ug | INHALATION_SOLUTION | Freq: Two times a day (BID) | RESPIRATORY_TRACT | Status: DC
Start: 2018-07-16 — End: 2018-07-18
  Administered 2018-07-16 – 2018-07-18 (×5): 15 ug via RESPIRATORY_TRACT
  Filled 2018-07-16 (×5): qty 2

## 2018-07-16 MED ORDER — GABAPENTIN 600 MG PO TABS
600.0000 mg | ORAL_TABLET | Freq: Two times a day (BID) | ORAL | Status: DC
  Filled 2018-07-16 (×3): qty 1

## 2018-07-16 MED ORDER — LIDOCAINE VISCOUS HCL 2 % MT SOLN
10.0000 mL | Freq: Four times a day (QID) | OROMUCOSAL | Status: DC
  Administered 2018-07-16 – 2018-07-18 (×8): 10 mL via OROMUCOSAL
  Filled 2018-07-16 (×8): qty 15

## 2018-07-16 MED ORDER — SENNOSIDES-DOCUSATE SODIUM 8.6-50 MG PO TABS: 1.0000 | ORAL_TABLET | Freq: Every evening | ORAL | Status: DC | PRN

## 2018-07-16 NOTE — Progress Notes (Signed)
Patient admitted to the hospital earlier this morning by Dr. Myna Hidalgo  Patient seen and examined. His shortness of breath is better since admission. He continues to be tachycardic. Still has some hemoptysis. He has bilateral rhonchi on exam. No lower extremity edema.  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  He is followed by cardiothoracic surgery.  He has chronic right lung opacification.  He recently started chemotherapy and came to the emergency room due to worsening shortness of breath and hemoptysis.  Labs were checked that indicated severe thrombocytopenia with a platelet count of 8000.  Hemoglobin was noted to be 7.3.  WBC count 0.7.  He was transfused 1 unit of PRBC and platelets.  Follow-up hemoglobin was 6.8 with a platelet count of 13,000.  He is due to receive another unit of PRBC as well as platelets.  His case was discussed by ER physician with his oncologist who did recommend transfusions.  We will continue supportive management at this time.  He does not have any gross evidence of bleeding.  Overall prognosis appears to be poor.  Will request oncology to formally consult tomorrow.  We will also request palliative care consultation.  Agree with further note and orders per Dr. Myna Hidalgo.  [employer]

## 2018-07-16 NOTE — ED Notes (Signed)
Pt's sister [name] can be reached at [phone].

## 2018-07-16 NOTE — H&P (Signed)
History and Physical    Erik [name] QBH:[id number] DOB: 01/31/1961 DOA: 07/15/2018  PCP: [name], MD   Patient coming from: Home   Chief Complaint: Pain and difficulty with swallowing, malaise, increased SOB   HPI: Erik [name] is a 57 y.o. male with medical history significant for emphysema, chronic hypoxic respiratory failure, depression with anxiety, history of PE on Eliquis, and adenocarcinoma of the right lung status post radiation and currently undergoing chemotherapy, now presenting to the emergency department for evaluation of odynophagia, malaise, and increased shortness of breath.  Patient was seen in the emergency department on 07/11/2018, noted to have some hemoptysis and tachycardia; this appeared to be chronic and stable and the patient returned home after CTA was negative for PE.  Since that time, he has continued to have difficulty and pain with swallowing.  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  He has been able to tolerate some liquids, but not solids.  He had increased hemoptysis in the emergency department today.  Denies chest pain, fevers, or chills.  Denies any melena or hematochezia.  ED Course: Upon arrival to the ED, patient is found to be afebrile, requiring increased FiO2 to maintain saturation in the 90s, tachypneic up to 40, tachycardic to 130, and with stable blood pressure.  EKG features sinus tachycardia with rate 127 and PVCs.  Chest x-ray is a stable study with near complete right hemithorax opacification and clear left lung.  Chemistry panel is unremarkable and CBC is notable for a WBC of 700, hemoglobin 7.3, and platelets of 8000.  Patient reported some improvement in his odynophagia with GI cocktail in the ED and has been able to tolerate sips of liquids.  Oncology was consulted by the ED physician and recommended transfusing RBC and platelets.  Transfusions were begun  in the emergency department, patient remains tachypneic and tachycardic, blood pressure remained stable, and he will be admitted for ongoing evaluation and management.  Review of Systems:  All other systems reviewed and apart from HPI, are negative.  Past Medical History:  Diagnosis Date  . 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 Arthritis   . Asthma   . Chronic back pain   . COPD (chronic obstructive pulmonary disease) ([place])   . Depression   . Dyspnea    with exertion   . GERD (gastroesophageal reflux disease)   . Hypertension   . Pneumonia   . Pulmonary embolus ([place])   . Seasonal allergies   . Spinal stenosis of lumbar region   . 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 Wheezing     Past Surgical History:  Procedure Laterality Date  . APPLICATION OF WOUND VAC  04/19/2018   Procedure: APPLICATION OF WOUND VAC;  Surgeon: [name], MD;  Location: [place];  Service: Vascular;;  . APPLICATION OF WOUND VAC Right 04/21/2018   Procedure: WOUND VAC CHANGE;  Surgeon: [name], MD;  Location: [place];  Service: Thoracic;  Laterality: Right;  . APPLICATION OF WOUND VAC N/A 04/24/2018   Procedure: WOUND VAC CHANGE;  Surgeon: [name], MD;  Location: [place];  Service: Thoracic;  Laterality: N/A;  . BACK SURGERY     5 total  . BIOPSY  01/03/2017   Procedure: BIOPSY;  Surgeon: Daneil Dolin, MD;  [facility];  Service: Endoscopy;;  gastric  . CHEST TUBE INSERTION Right 04/19/2018  Procedure: CHEST TUBE INSERTION;  Surgeon: [name], MD;  Location: [place];  Service: Vascular;  Laterality: Right;  . CHEST TUBE INSERTION Right 04/19/2018   Procedure: CHEST TUBE INSERTION;  Surgeon: [name], MD;  Location: [place];  Service: Thoracic;  Laterality: Right;  . COLONOSCOPY WITH PROPOFOL N/A 04/04/2017   Procedure: COLONOSCOPY WITH PROPOFOL;  Surgeon: Daneil Dolin, MD;  [facility];  Service: Endoscopy;   Laterality: N/A;  8:45am  . DECORTICATION Right 03/28/2018   Procedure: DECORTICATION;  Surgeon: [name], MD;  Location: [facility];  Service: Thoracic;  Laterality: Right;  . ESOPHAGOGASTRODUODENOSCOPY (EGD) WITH PROPOFOL N/A 01/03/2017   Procedure: ESOPHAGOGASTRODUODENOSCOPY (EGD) WITH PROPOFOL;  Surgeon: Daneil Dolin, MD;  [facility];  Service: Endoscopy;  Laterality: N/A;  . I&D EXTREMITY Right 04/19/2018   Procedure: IRRIGATION AND DEBRIDEMENT RIGHT CHEST WOUND;  Surgeon: [name], MD;  Location: [place];  Service: Vascular;  Laterality: Right;  . MALONEY DILATION N/A 01/03/2017   Procedure: Venia Minks DILATION;  Surgeon: Daneil Dolin, MD;  [facility];  Service: Endoscopy;  Laterality: N/A;  . MULTIPLE EXTRACTIONS WITH ALVEOLOPLASTY N/A 07/08/2014   Procedure: MULTIPLE EXTRACION WITH ALVEOLOPLASTY with EXCISION LESION RIGHT SIDE OF TONGUE;  Surgeon: Gae Bon, DDS;  Location: [place];  Service: Oral Surgery;  Laterality: N/A;  . PORTACATH PLACEMENT Right 07/30/2016   Procedure: INSERTION PORT-A-CATH;  Surgeon: Vickie Epley, MD;  [facility];  Service: Vascular;  Laterality: Right;  . THORACOTOMY  04/19/2018   Procedure: THORACOTOMY MAJOR;  Surgeon: [name], MD;  Location: [place];  Service: Vascular;;  . VIDEO ASSISTED THORACOSCOPY Right 04/21/2018   Procedure: VIDEO ASSISTED THORACOSCOPY;  Surgeon: [name], MD;  Location: [place];  Service: Thoracic;  Laterality: Right;  [name] VIDEO ASSISTED THORACOSCOPY Right 04/24/2018   Procedure: VIDEO ASSISTED THORACOSCOPY;  Surgeon: [name], MD;  Location: [place];  Service: Thoracic;  Laterality: Right;  [name] VIDEO ASSISTED THORACOSCOPY (VATS)/EMPYEMA Right 03/28/2018   Procedure: VIDEO ASSISTED THORACOSCOPY (VATS)/EMPYEMA;  Surgeon: [name], MD;  Location: [place];  Service: Thoracic;  Laterality: Right;  [name] VIDEO BRONCHOSCOPY WITH ENDOBRONCHIAL ULTRASOUND N/A  07/19/2016   Procedure: VIDEO BRONCHOSCOPY WITH ENDOBRONCHIAL ULTRASOUND;  Surgeon: [name], MD;  Location: [place];  Service: Thoracic;  Laterality: N/A;     reports that he quit smoking about 4 months ago. His smoking use included cigarettes. He has a 9.50 pack-year smoking history. He has quit using smokeless tobacco.  His smokeless tobacco use included chew. He reports that he drinks about 2.0 standard drinks of alcohol per week. He reports that he has current or past drug history. Drug: Marijuana. Frequency: 7.00 times per week.  Allergies  Allergen Reactions  . Demeclocycline Swelling    SWELLING REACTION UNSPECIFIED   . Tetracyclines & Related Swelling    SWELLING REACTION UNSPECIFIED     Family History  Problem Relation Age of Onset  . Cancer Mother        breast cancer  . Heart failure Brother   . Colon cancer Neg Hx        not sure, ?grandfather and/or uncle     Prior to Admission medications   Medication Sig Start Date End Date Taking? Authorizing Provider  acetaminophen (TYLENOL) 325 MG tablet Take 2 tablets (650 mg total) by mouth every 6 (six) hours as needed for mild pain (or Fever >/=  101). 05/01/18  Yes Barrett, Erin R, PA-C  albuterol (PROVENTIL HFA;VENTOLIN HFA) 108 (90 Base) MCG/ACT inhaler Inhale 2 puffs into the lungs every 6 (six) hours as needed for wheezing or shortness of breath. 06/28/18  Yes Lockamy, Randi L, NP-C  albuterol (PROVENTIL) (2.5 MG/3ML) 0.083% nebulizer solution Take 3 mLs (2.5 mg total) by nebulization every 6 (six) hours as needed for wheezing or shortness of breath (if unable to experience relief by using combivent inhaler). 03/08/18  Yes Barton Dubois, MD  ALPRAZolam Duanne Moron) 1 MG tablet Take 1 mg by mouth 2 (two) times daily as needed for anxiety or sleep (takes 1 tablet and bedtime for sleep and 1 dose during the day only if needed).  06/26/16  Yes [provider]  amLODipine (NORVASC) 5 MG tablet Take 5 mg by mouth daily.   05/17/18  Yes [provider]  apixaban (ELIQUIS) 5 MG TABS tablet Take 1 tablet (5 mg total) by mouth 2 (two) times daily. 06/14/18  Yes Lockamy, Randi L, NP-C  Artificial Tear Solution (SOOTHE XP) SOLN Apply 2 drops to eye daily.   Yes [provider]  escitalopram (LEXAPRO) 20 MG tablet Take 20 mg by mouth daily.  05/10/18  Yes [provider]  folic acid (FOLVITE) 1 MG tablet Take 1 tablet (1 mg total) by mouth daily. 06/27/18  Yes Lockamy, Randi L, NP-C  furosemide (LASIX) 20 MG tablet Take 40 mg by mouth daily.   Yes [provider]  gabapentin (NEURONTIN) 600 MG tablet Take 600 mg by mouth 2 (two) times daily.  08/19/17  Yes [provider]  guaiFENesin (MUCINEX) 600 MG 12 hr tablet Take 600 mg by mouth 2 (two) times daily as needed (for congestion.).   Yes [provider]  Ipratropium-Albuterol (COMBIVENT) 20-100 MCG/ACT AERS respimat Inhale 1 puff into the lungs every 6 (six) hours as needed for wheezing or shortness of breath. 03/08/18  Yes Barton Dubois, MD  lactulose (CHRONULAC) 10 GM/15ML solution Take 15 mLs (10 g total) by mouth 3 (three) times daily as needed for mild constipation or moderate constipation. 01/23/18  Yes Burns, Wandra Feinstein, NP  levalbuterol Penne Lash) 0.63 MG/3ML nebulizer solution Take 3 mLs (0.63 mg total) by nebulization every 8 (eight) hours as needed for wheezing or shortness of breath. 05/01/18  Yes Barrett, Erin R, PA-C  methadone (DOLOPHINE) 10 MG tablet Take 20 mg by mouth 5 (five) times daily. Takes 2 tablets 5 times daily 06/26/16  Yes [provider]  mirtazapine (REMERON) 15 MG tablet Take 1 tablet by mouth at bedtime. 08/17/17  Yes [provider]  Multiple Vitamin (MULTIVITAMIN WITH MINERALS) TABS tablet Take 1 tablet by mouth daily. Centrum   Yes [provider]  ondansetron (ZOFRAN) 8 MG tablet TAKE 1 TABLET BY MOUTH EVERY 8 HOURS AS NEEDED FOR NAUSEA AND VOMITING. Patient taking  differently: Take 8 mg by mouth every 8 (eight) hours as needed. TAKE 1 TABLET BY MOUTH EVERY 8 HOURS AS NEEDED FOR NAUSEA AND VOMITING. 03/02/18  Yes Holley Bouche, NP  OXYGEN Inhale 3 L into the lungs daily.    Yes [provider]  pantoprazole (PROTONIX) 40 MG tablet Take 1 tablet (40 mg total) by mouth 2 (two) times daily. 06/14/18  Yes Lockamy, Randi L, NP-C  PEMEtrexed Disodium (ALIMTA IV) Inject into the vein as directed.    Yes [provider]  potassium chloride SA (K-DUR,KLOR-CON) 20 MEQ tablet Take 1 tablet (20 mEq total) by mouth  daily. 03/14/18  Yes Derek Jack, MD  prochlorperazine (COMPAZINE) 10 MG tablet Take 1 tablet (10 mg total) by mouth every 6 (six) hours as needed (Nausea or vomiting). 06/28/18  Yes Derek Jack, MD  STIOLTO RESPIMAT 2.5-2.5 MCG/ACT AERS Inhale 1 puff into the lungs 3 (three) times daily. 07/04/18  Yes [provider]  tiZANidine (ZANAFLEX) 4 MG tablet Take 4 mg by mouth every 8 (eight) hours as needed for muscle spasms.  06/03/16  Yes [provider]  apixaban (ELIQUIS) 5 MG TABS tablet Take 1 tablet (5 mg total) by mouth 2 (two) times daily. Patient not taking: Reported on 07/11/2018 06/14/18   Francene Finders L, NP-C  CARBOPLATIN IV Inject into the vein.    [provider]  loperamide (IMODIUM) 2 MG capsule Take 1 capsule (2 mg total) by mouth as needed for diarrhea or loose stools. Patient not taking: Reported on 07/04/2018 04/08/18   Patrecia Pour, MD  Misc. Devices MISC POC at 2 lpm via [place] with conserver / pulse ox on exertion. 05/03/18   Higgs, Mathis Dad, MD  sucralfate (CARAFATE) 1 GM/10ML suspension carafate 1gm/80ml & viscous Lidocaine 2% 1:1 mixture.  Swish and swallow 1 tablespoon four times a day. Patient not taking: Reported on 07/15/2018 07/10/18   Derek Jack, MD  varenicline (CHANTIX) 0.5 MG tablet TAKE (2) TABLETS BY MOUTH TWICE DAILY. Patient not taking: Reported on 07/11/2018 04/11/18    Holley Bouche, NP    Physical Exam: Vitals:   07/15/18 2315 07/15/18 2326 07/15/18 2330 07/15/18 2345  BP: (!) 167/76  131/83 (!) 145/75  Pulse: 69  (!) 141 (!) 140  Resp: (!) 34  (!) 39   Temp:      TempSrc:  Oral    SpO2: 100%  99% 99%  Weight:      Height:          Constitutional: In respiratory distress with tachypnea and increased WOB, no pallor, no diaphoresis  Eyes: PERTLA, lids and conjunctivae normal ENMT: Mucous membranes are moist. Posterior pharynx clear of any exudate or lesions.   Neck: normal, supple, no masses, no thyromegaly Respiratory: Markedly diminished breath sounds on right, scattered rhonchi. Tachypnea. Dyspnea with speech. Increased WOB. No pallor or cyanosis.   Cardiovascular: Rate ~120 and regular. 1+ pedal edema bilaterally. Abdomen: No distension, no tenderness, soft. Bowel sounds active.  Musculoskeletal: no clubbing / cyanosis. No joint deformity upper and lower extremities.   Skin: Petechiae on chest. Warm, dry, well-perfused. Neurologic: CN 2-12 grossly intact. Sensation intact. Moving all extremities.  Psychiatric:  Alert and oriented x 3. Pleasant and cooperative.     Labs on Admission: I have personally reviewed following labs and imaging studies  CBC: Recent Labs  Lab 07/11/18 1009 07/15/18 1617  WBC 0.4* 0.7*  NEUTROABS 0.0 0.2*  HGB 8.4* 7.3*  HCT 26.5* 22.5*  MCV 93.6 91.5  PLT 72* 8*   Basic Metabolic Panel: Recent Labs  Lab 07/11/18 1009 07/15/18 1617  NA 134* 135  K 3.5 3.5  CL 98 102  CO2 29 28  GLUCOSE 119* 132*  BUN 13 16  CREATININE 0.68 0.74  CALCIUM 8.1* 8.1*   GFR: Estimated Creatinine Clearance: 127.6 mL/min (by C-G formula based on SCr of 0.74 mg/dL). Liver Function Tests: No results for input(s): AST, ALT, ALKPHOS, BILITOT, PROT, ALBUMIN in the last 168 hours. No results for input(s): LIPASE, AMYLASE in the last 168 hours. No results for input(s): AMMONIA in the last 168 hours.  Coagulation  Profile: No results for input(s): INR, PROTIME in the last 168 hours. Cardiac Enzymes: Recent Labs  Lab 07/11/18 1009 07/11/18 1251  TROPONINI <0.03 <0.03   BNP (last 3 results) No results for input(s): PROBNP in the last 8760 hours. HbA1C: No results for input(s): HGBA1C in the last 72 hours. CBG: No results for input(s): GLUCAP in the last 168 hours. Lipid Profile: No results for input(s): CHOL, HDL, LDLCALC, TRIG, CHOLHDL, LDLDIRECT in the last 72 hours. Thyroid Function Tests: No results for input(s): TSH, T4TOTAL, FREET4, T3FREE, THYROIDAB in the last 72 hours. Anemia Panel: No results for input(s): VITAMINB12, FOLATE, FERRITIN, TIBC, IRON, RETICCTPCT in the last 72 hours. Urine analysis:    Component Value Date/Time   COLORURINE YELLOW 07/15/2018 1729   APPEARANCEUR CLEAR 07/15/2018 1729   LABSPEC 1.013 07/15/2018 1729   PHURINE 7.0 07/15/2018 1729   GLUCOSEU NEGATIVE 07/15/2018 1729   HGBUR MODERATE (A) 07/15/2018 1729   BILIRUBINUR NEGATIVE 07/15/2018 1729   KETONESUR NEGATIVE 07/15/2018 1729   PROTEINUR NEGATIVE 07/15/2018 1729   NITRITE NEGATIVE 07/15/2018 1729   LEUKOCYTESUR NEGATIVE 07/15/2018 1729   Sepsis Labs: @LABRCNTIP (CMKLKJZPHXTAV:6,PVXYIAXKPVV:7) )No results found for this or any previous visit (from the past 240 hour(s)).   Radiological Exams on Admission: Dg Chest 2 View  Result Date: 07/15/2018 CLINICAL DATA:  Poor p.o. intake due to chemotherapy for lung cancer. EXAM: CHEST - 2 VIEW COMPARISON:  July 11, 2018 chest x-ray FINDINGS: There is near complete opacification of the right chest, unchanged. The right Port-A-Cath is stable. There is relative volume loss on the right. No pneumothorax. The left lung is clear. Visualized cardiomediastinal silhouette is stable. No other interval changes. IMPRESSION: No significant interval change. Near complete opacification of the right chest with volume loss. Electronically Signed   By: Dorise Bullion III M.D    On: 07/15/2018 16:59   Dg Chest Port 1 View  Result Date: 07/15/2018 CLINICAL DATA:  57 y/o M; increased difficulty of breathing and decreased oxygen saturation. History of lung cancer. EXAM: PORTABLE CHEST 1 VIEW COMPARISON:  07/15/2018 chest radiograph. FINDINGS: Stable near complete opacification of the right hemithorax with volume loss and clear left lung given projection and technique right port catheter tip projects over lower SVC. No left-sided effusion. Stable cardiac silhouette with stable rightward mediastinal deviation from volume loss in the right hemithorax. No acute osseous abnormality is evident. IMPRESSION: Stable near complete opacification of right hemithorax with volume loss and clear left lung given projection and technique. Electronically Signed   By: Kristine Garbe M.D.   On: 07/15/2018 23:23    EKG: Independently reviewed. Sinus tachycardia (rate 127), PVC's.   Assessment/Plan   1. Hemoptysis  - Presents with odynophagia and progressive SOB, noted to have hemoptysis in ED which has been present for weeks, but now more severe  - This was evaluated with CTA chest on 07/11/18 that was negative for PE  - Worsening likely secondary to thrombocytopenia and platelets are being transfused  - Hold Eliquis, check post-transfusion CBC and transfuse additional platelets as needed   2. Pancytopenia  - WBC has improved slightly with ANC now 154  - Hgb is 7.3, down from 8.4 several days ago  - Platelets now 8k, down from 72k on 8/6  - He has had increased hemoptysis as above  - RBC and platelets transfusing in ED  - Continue neutropenic precautions, infection surveillance, check post-transfusion CBC   3. Lung cancer  - Patient is following with oncology  for adenocarcinoma of the right lung, has been treated with radiation and chemotherapy, last infusion was last week   - Oncologist was consulted by ED physician    4. History of PE  - No PE on CTA from 07/11/18  - Eliquis  held in light of hemoptysis    5. Acute on chronic hypoxic respiratory failure  - Typically uses 2-3 Lpm at home, but sats dropping to 70's in ED intermittently with that  - CXR appears stable; no PE on CTA a few days ago  - Continue supplemental O2, inhalers, prn nebs    6. Chronic pain  - Cancer-related  - Continue methadone, gabapentin, prn tizanidine, prn laxatives    7. Depression with anxiety  - Stable  - Continue Lexapro, Remeron, Xanax    8. COPD   - Continue inhalers, prn nebs, supplemental O2   9. 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 Hypertension  - Antihypertensives held initially d/t critical illness with acute bleeding    DVT prophylaxis: SCD's   Code Status: DNR  Family Communication: Sister and brother-in-law updated at bedside with patient's permission  Consults called: Oncology  Admission status: Inpatient     Vianne Bulls, MD [facility] Pager [phone]  If 7PM-7AM, please contact night-coverage [email or web address] Password [facility]  07/16/2018, 12:04 AM

## 2018-07-17 ENCOUNTER — Telehealth (HOSPITAL_COMMUNITY): Payer: Self-pay | Admitting: *Deleted

## 2018-07-17 ENCOUNTER — Encounter (HOSPITAL_COMMUNITY): Payer: Self-pay | Admitting: Primary Care

## 2018-07-17 DIAGNOSIS — R042 Hemoptysis: Secondary | ICD-10-CM

## 2018-07-17 DIAGNOSIS — Z7189 Other specified counseling: Secondary | ICD-10-CM

## 2018-07-17 DIAGNOSIS — C3491 Malignant neoplasm of unspecified part of right bronchus or lung: Secondary | ICD-10-CM

## 2018-07-17 DIAGNOSIS — F17210 Nicotine dependence, cigarettes, uncomplicated: Secondary | ICD-10-CM

## 2018-07-17 DIAGNOSIS — D61810 Antineoplastic chemotherapy induced pancytopenia: Principal | ICD-10-CM

## 2018-07-17 DIAGNOSIS — Z515 Encounter for palliative care: Secondary | ICD-10-CM

## 2018-07-17 DIAGNOSIS — C799 Secondary malignant neoplasm of unspecified site: Secondary | ICD-10-CM

## 2018-07-17 DIAGNOSIS — T451X5A Adverse effect of antineoplastic and immunosuppressive drugs, initial encounter: Secondary | ICD-10-CM

## 2018-07-17 DIAGNOSIS — K1231 Oral mucositis (ulcerative) due to antineoplastic therapy: Secondary | ICD-10-CM

## 2018-07-17 LAB — BPAM RBC
Blood Product Expiration Date: 201909012359
Blood Product Expiration Date: 201909042359
Blood Product Expiration Date: 201909052359
ISSUE DATE / TIME: 201908110818
ISSUE DATE / TIME: 201908112126
ISSUE DATE / TIME: 201908102052
UNIT TYPE AND RH: 9500
Unit Type and Rh: 9500
Unit Type and Rh: 9500

## 2018-07-17 LAB — TYPE AND SCREEN
ABO/RH(D): O NEG
Antibody Screen: NEGATIVE
Unit division: 0
Unit division: 0
Unit division: 0

## 2018-07-17 LAB — CBC WITH DIFFERENTIAL/PLATELET
BASOS PCT: 0 %
Basophils Absolute: 0 10*3/uL (ref 0.0–0.1)
EOS ABS: 0 10*3/uL (ref 0.0–0.7)
Eosinophils Relative: 1 %
HCT: 23.8 % — ABNORMAL LOW (ref 39.0–52.0)
Hemoglobin: 7.6 g/dL — ABNORMAL LOW (ref 13.0–17.0)
LYMPHS ABS: 0.7 10*3/uL (ref 0.7–4.0)
Lymphocytes Relative: 45 %
MCH: 29.1 pg (ref 26.0–34.0)
MCHC: 31.9 g/dL (ref 30.0–36.0)
MCV: 91.2 fL (ref 78.0–100.0)
Monocytes Absolute: 0.4 10*3/uL (ref 0.1–1.0)
Monocytes Relative: 27 %
NEUTROS PCT: 27 %
Neutro Abs: 0.4 10*3/uL (ref 1.7–7.7)
PLATELETS: 31 10*3/uL — AB (ref 150–400)
RBC: 2.61 MIL/uL — AB (ref 4.22–5.81)
RDW: 15.9 % — AB (ref 11.5–15.5)
WBC: 1.5 10*3/uL — AB (ref 4.0–10.5)

## 2018-07-17 LAB — GLUCOSE, CAPILLARY: GLUCOSE-CAPILLARY: 105 mg/dL — AB (ref 70–99)

## 2018-07-17 MED ORDER — ACETYLCYSTEINE 20 % IN SOLN
4.0000 mL | Freq: Once | RESPIRATORY_TRACT | Status: AC
  Administered 2018-07-17: 4 mL via RESPIRATORY_TRACT
  Filled 2018-07-17: qty 4

## 2018-07-17 MED ORDER — METOPROLOL TARTRATE 5 MG/5ML IV SOLN
5.0000 mg | Freq: Once | INTRAVENOUS | Status: AC
  Administered 2018-07-17: 5 mg via INTRAVENOUS
  Filled 2018-07-17: qty 5

## 2018-07-17 MED ORDER — ALPRAZOLAM 1 MG PO TABS
1.0000 mg | ORAL_TABLET | Freq: Three times a day (TID) | ORAL | Status: DC
  Administered 2018-07-17 – 2018-07-18 (×3): 1 mg via ORAL
  Filled 2018-07-17 (×3): qty 1

## 2018-07-17 NOTE — Care Management Important Message (Signed)
Important Message  Patient Details  Name: Erik [name] MRN: [id number] Date of Birth: [birth year]   Medicare Important Message Given:  Yes    Shelda Altes 07/17/2018, 11:24 AM

## 2018-07-17 NOTE — Progress Notes (Signed)
PROGRESS NOTE    Erik [name]  ZOX:[id number] DOB: [birth year] DOA: 07/15/2018 PCP: [name], MD   Brief Narrative:   Erik [name] is a 57 y.o. male with medical history significant for emphysema, chronic hypoxic respiratory failure, depression with anxiety, history of PE on Eliquis, and adenocarcinoma of the right lung status post radiation and currently undergoing chemotherapy, who presented to the emergency department on 8/10 for evaluation of odynophagia, malaise, and increased shortness of breath.  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  Oncology and palliative care consultations pending.  Assessment & Plan:   Principal Problem:   Hemoptysis Active Problems:   Depression with anxiety   Adenocarcinoma of right lung (HCC)   Radiation-induced esophagitis   COPD (chronic obstructive pulmonary disease) (HCC)   Acute on chronic respiratory failure with hypoxia (HCC)   History of pulmonary embolism   Antineoplastic chemotherapy induced pancytopenia ([place])   1. Hemoptysis and odynophagia in the setting of right lung adenocarcinoma with prior radiation and recent initiation of chemotherapy.  Symptoms are currently persistent and related to pancytopenia.  Eliquis continues to be withheld.  Appreciate palliative care consultation to help with symptom management as well as further set up for home vs residential hospice. 2. Acute on chronic hypoxemic respiratory failure secondary to above.  Continue supplemental oxygen and wean as tolerated to his usual 2 to 3 L/min at home.  Continue as needed nebulizer treatments. 3. Pancytopenia.  Improving status post PRBC and platelet transfusion.  Continue to monitor with repeat CBC.  Further recommendations per heme/onc. 4. History of PE.  Continue to withhold Eliquis.  No PE on CTA from 07/11/2018 5. Chronic pain  related to cancer.  Continue methadone, gabapentin, PRN tizanidine, and PRN laxatives. 6. Depression and anxiety.  Currently stable.  Continue Lexapro, Remeron, and Xanax. 7. COPD.  Continue inhalers as needed along with supplemental oxygen. 8. Hypertension.  Continue to withhold antihypertensives at this point and monitor closely.   DVT prophylaxis:SCDs Code Status: DNR Family Communication: Brother and Sister at bedside Disposition Plan: Oncology and palliative care consultation pending.  Transfer to floor.  Likely placement to home versus residential hospice in the next 1 to 2 days.   Consultants:   Oncology  Palliative care  Procedures:   None  Antimicrobials:   None   Subjective: Patient seen and evaluated today with no new acute complaints or concerns. No acute concerns or events noted overnight.  He states that he is eager to talk to his oncologist about likely not pursuing any further aggressive treatment.  Objective: Vitals:   07/17/18 0615 07/17/18 0630 07/17/18 0645 07/17/18 0700  BP: (!) 102/55 114/72 101/67 118/62  Pulse: (!) 114 (!) 116 (!) 117 (!) 117  Resp: 20 20 20  (!) 21  Temp:      TempSrc:      SpO2: 97% 97% 96% 95%  Weight:      Height:        Intake/Output Summary (Last 24 hours) at 07/17/2018 0710 Last data filed at 07/17/2018 0500 Gross per 24 hour  Intake 1495.5 ml  Output 2500 ml  Net -1004.5 ml   Filed Weights   07/16/18 0249 07/16/18 0557 07/17/18 0500  Weight: 97.8 kg 97.8 kg 98.1 kg    Examination:  General exam: Appears calm and comfortable  Respiratory system: Crackles noted bilaterally. Respiratory effort  normal.  Currently on 4 L nasal cannula. Cardiovascular system: S1 & S2 heard, RRR. No JVD, murmurs, rubs, gallops or clicks. No pedal edema. Gastrointestinal system: Abdomen is nondistended, soft and nontender. No organomegaly or masses felt. Normal bowel sounds heard. Central nervous system: Alert and oriented. No focal  neurological deficits. Extremities: Symmetric 5 x 5 power. Skin: No rashes, lesions or ulcers Psychiatry: Judgement and insight appear normal. Mood & affect appropriate.  Foley with clear, yellow, urine output.    Data Reviewed: I have personally reviewed following labs and imaging studies  CBC: Recent Labs  Lab 07/11/18 1009 07/15/18 1617 07/16/18 0423 07/16/18 1858 07/17/18 0434  WBC 0.4* 0.7* 0.9* 0.9* 1.5*  NEUTROABS 0.0 0.2*  --   --  0.4  HGB 8.4* 7.3* 6.8* 7.1* 7.6*  HCT 26.5* 22.5* 20.7* 22.0* 23.8*  MCV 93.6 91.5 91.2 90.5 91.2  PLT 72* 8* 13* 16* 31*   Basic Metabolic Panel: Recent Labs  Lab 07/11/18 1009 07/15/18 1617 07/16/18 0423  NA 134* 135 135  K 3.5 3.5 3.6  CL 98 102 103  CO2 29 28 26   GLUCOSE 119* 132* 156*  BUN 13 16 21*  CREATININE 0.68 0.74 0.65  CALCIUM 8.1* 8.1* 7.8*   GFR: Estimated Creatinine Clearance: 126.6 mL/min (by C-G formula based on SCr of 0.65 mg/dL). Liver Function Tests: No results for input(s): AST, ALT, ALKPHOS, BILITOT, PROT, ALBUMIN in the last 168 hours. No results for input(s): LIPASE, AMYLASE in the last 168 hours. No results for input(s): AMMONIA in the last 168 hours. Coagulation Profile: No results for input(s): INR, PROTIME in the last 168 hours. Cardiac Enzymes: Recent Labs  Lab 07/11/18 1009 07/11/18 1251  TROPONINI <0.03 <0.03   BNP (last 3 results) No results for input(s): PROBNP in the last 8760 hours. HbA1C: No results for input(s): HGBA1C in the last 72 hours. CBG: Recent Labs  Lab 07/16/18 0742  GLUCAP 116*   Lipid Profile: No results for input(s): CHOL, HDL, LDLCALC, TRIG, CHOLHDL, LDLDIRECT in the last 72 hours. Thyroid Function Tests: No results for input(s): TSH, T4TOTAL, FREET4, T3FREE, THYROIDAB in the last 72 hours. Anemia Panel: No results for input(s): VITAMINB12, FOLATE, FERRITIN, TIBC, IRON, RETICCTPCT in the last 72 hours. Sepsis Labs: Recent Labs  Lab 07/11/18 1111  07/11/18 1251  LATICACIDVEN 1.2 0.9    Recent Results (from the past 240 hour(s))  MRSA PCR Screening     Status: None   Collection Time: 07/16/18  2:24 AM  Result Value Ref Range Status   MRSA by PCR NEGATIVE NEGATIVE Final    Comment:        The GeneXpert MRSA Assay (FDA approved for NASAL specimens only), is one component of a comprehensive MRSA colonization surveillance program. It is not intended to diagnose MRSA infection nor to guide or monitor treatment for MRSA infections. Performed at [facility], [address] [place], [place] [address]          Radiology Studies: Dg Chest 2 View  Result Date: 07/15/2018 CLINICAL DATA:  Poor p.o. intake due to chemotherapy for lung cancer. EXAM: CHEST - 2 VIEW COMPARISON:  July 11, 2018 chest x-ray FINDINGS: There is near complete opacification of the right chest, unchanged. The right Port-A-Cath is stable. There is relative volume loss on the right. No pneumothorax. The left lung is clear. Visualized cardiomediastinal silhouette is stable. No other interval changes. IMPRESSION: No significant interval change. Near complete opacification of the right chest with volume loss.  Electronically Signed   By: Dorise Bullion III M.D   On: 07/15/2018 16:59   Dg Chest Port 1 View  Result Date: 07/15/2018 CLINICAL DATA:  57 y/o M; increased difficulty of breathing and decreased oxygen saturation. History of lung cancer. EXAM: PORTABLE CHEST 1 VIEW COMPARISON:  07/15/2018 chest radiograph. FINDINGS: Stable near complete opacification of the right hemithorax with volume loss and clear left lung given projection and technique right port catheter tip projects over lower SVC. No left-sided effusion. Stable cardiac silhouette with stable rightward mediastinal deviation from volume loss in the right hemithorax. No acute osseous abnormality is evident. IMPRESSION: Stable near complete opacification of right hemithorax with volume loss and clear left  lung given projection and technique. Electronically Signed   By: Kristine Garbe M.D.   On: 07/15/2018 23:23        Scheduled Meds: . arformoterol  15 mcg Nebulization BID  . escitalopram  20 mg Oral Daily  . folic acid  1 mg Oral Daily  . gabapentin  600 mg Oral BID  . lidocaine  10 mL Mouth/Throat QID   And  . magic mouthwash  15 mL Oral QID  . methadone  20 mg Oral 5 X Daily  . mirtazapine  15 mg Oral QHS  . multivitamin with minerals  1 tablet Oral Daily  . pantoprazole  40 mg Oral BID  . polyvinyl alcohol  2 drop Both Eyes Daily  . potassium chloride SA  20 mEq Oral Daily  . sodium chloride flush  3 mL Intravenous Q12H  . sucralfate  1 g Oral Q6H  . umeclidinium bromide  1 puff Inhalation Daily   Continuous Infusions: . famotidine (PEPCID) IV Stopped (07/16/18 2147)     LOS: 1 day    Time spent: 30 minutes    Zamire Whitehurst Darleen Crocker, DO [facility] Pager [phone]  If 7PM-7AM, please contact night-coverage [email or web address] Password [email or web address] 07/17/2018, 7:10 AM

## 2018-07-17 NOTE — Consult Note (Signed)
Consultation Note Date: 07/17/2018   Patient Name: Erik [name]  DOB: 08-Jul-1961  MRN: [id number]  Age / Sex: 57 y.o., male  PCP: [name], MD Referring Physician: Rodena Goldmann, DO  Reason for Consultation: Establishing goals of care and Psychosocial/spiritual support  HPI/Patient Profile: 57 y.o. male  with past medical history of stage 3 lung cancer diagnosed 2017, COPD, DVT, depression and anxiety, HTN, history of pneumonia, empyema April '19 admitted on 07/15/2018 with Hemoptysis and odynophagia in the setting of right lung adenocarcinoma with prior radiation and recent initiation of chemotherapy.   Clinical Assessment and Goals of Care: Erik [name], [place]", is resting quietly in bed.  He does not wake when I enter the room or as I am talking with his Sister Erik [name] and brother Erik [name].  Their mother passed away suddenly last week.  Erik [name] and I talk about Erik [name]'s current health condition.  Erik [name] shares that [place] tells them he no longer wants to take chemo, especially in the light of the side effects.  We talked about trusted oncologist to visit later today, healthcare power of attorney, DNR status.  All questions answered. Return later in the day after transfer to the floor.  Erik [name] greets me making and keeping eye contact.  He is calm and cooperative.  We talked about symptom management.  We also talked about Dr. Marthann Schiller visit later in the day.  At this point, Erik [name] is unsure about continuing chemotherapy.  He states that he does not want to continue with these hard side effects, but will continue treatment if an option is offered with fewer side effects. We talked about healthcare power of attorney, see below.  We talked about CODE STATUS, see below. I share that Erik [name] may need 24-hour care at this point.  I encouraged family to work out a schedule.  We talked about home with hospice, the benefits for symptom  management and support. We plan for follow-up meeting tomorrow.  Health care POA NEXT OF KIN -  Sister Erik [name] is primary decision maker, supported by brother Erik [name].  Patient endorses Sister Erik [name] and brother as his healthcare surrogates.   SUMMARY OF RECOMMENDATIONS   Awaiting input from trusted oncologist, Dr. Raliegh [name]. Considering home with hospice if he decides no further chemotherapy.  Code Status/Advance Care Planning:  DNR -endorsed by patient and family.  Symptom Management:   Increased Xanax to 1 mg 3 times daily scheduled.  Palliative Prophylaxis:   Frequent Pain Assessment and Oral Care  Additional Recommendations (Limitations, Scope, Preferences):  Treat the treatable but no CPR, no intubation.  Considering continuation of chemotherapy.  Psycho-social/Spiritual:   Desire for further Chaplaincy support:no  Additional Recommendations: Caregiving  Support/Resources and Education on Hospice  Prognosis:   < 3 months less would not be surprising based on 5 hospitalizations in 6 months, advancing cancer.  Discharge Planning: To be determined, home with home health if he continues with chemotherapy versus home with hospice.      Primary Diagnoses: Present on Admission: .  Depression with anxiety . Adenocarcinoma of right lung ([place]) . Radiation-induced esophagitis . COPD (chronic obstructive pulmonary disease) ([place]) . Acute on chronic respiratory failure with hypoxia ([place]) . History of pulmonary embolism . Antineoplastic chemotherapy induced pancytopenia ([place]) . Hemoptysis   I have reviewed the medical record, interviewed the patient and family, and examined the patient. The following aspects are pertinent.  Past Medical History:  Diagnosis Date  . Arnold-Chiari syndrome ([place])   . Arthritis   . Asthma   . Chronic back pain   . COPD (chronic obstructive pulmonary disease) ([place])   . Depression   . Dyspnea    with exertion   . GERD  (gastroesophageal reflux disease)   . Hypertension   . Pneumonia   . Pulmonary embolus ([place])   . Seasonal allergies   . Spinal stenosis of lumbar region   . 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 Wheezing    Social History   Socioeconomic History  . Marital status: Divorced    Spouse name: Not on file  . Number of children: Not on file  . Years of education: Not on file  . Highest education level: Not on file  Occupational History  . Not on file  Social Needs  . Financial resource strain: Not on file  . Food insecurity:    Worry: Not on file    Inability: Not on file  . Transportation needs:    Medical: Not on file    Non-medical: Not on file  Tobacco Use  . Smoking status: Former Smoker    Packs/day: 0.25    Years: 38.00    Pack years: 9.50    Types: Cigarettes    Last attempt to quit: 02/16/2018    Years since quitting: 0.4  . Smokeless tobacco: Former Systems developer    Types: Chew  . Tobacco comment: [place] STAY  Substance and Sexual Activity  . Alcohol use: Yes    Alcohol/week: 2.0 standard drinks    Types: 2 Cans of beer per week    Comment: reports drinking 0-2 beers a week  . Drug use: Yes    Frequency: 7.0 times per week    Types: Marijuana    Comment: daily for cancer; pt denies any use at present time 08/23/17  . Sexual activity: Not Currently  Lifestyle  . Physical activity:    Days per week: Not on file    Minutes per session: Not on file  . Stress: Not on file  Relationships  . Social connections:    Talks on phone: Not on file    Gets together: Not on file    Attends religious service: Not on file    Active member of club or organization: Not on file    Attends meetings of clubs or organizations: Not on file    Relationship status: Not on file  Other Topics Concern  . Not on file  Social History Narrative  . Not on file   Family History  Problem Relation Age of Onset  . Cancer Mother        breast cancer  .  Heart failure Brother   . Colon cancer Neg Hx        not sure, ?grandfather and/or uncle   Scheduled Meds: . arformoterol  15 mcg Nebulization BID  . escitalopram  20 mg Oral Daily  . folic acid  1 mg Oral Daily  . gabapentin  600 mg Oral BID  .  lidocaine  10 mL Mouth/Throat QID   And  . magic mouthwash  15 mL Oral QID  . methadone  20 mg Oral 5 X Daily  . mirtazapine  15 mg Oral QHS  . multivitamin with minerals  1 tablet Oral Daily  . pantoprazole  40 mg Oral BID  . polyvinyl alcohol  2 drop Both Eyes Daily  . potassium chloride SA  20 mEq Oral Daily  . sodium chloride flush  3 mL Intravenous Q12H  . sucralfate  1 g Oral Q6H  . umeclidinium bromide  1 puff Inhalation Daily   Continuous Infusions: . famotidine (PEPCID) IV Stopped (07/17/18 1021)   PRN Meds:.acetaminophen **OR** acetaminophen, albuterol, ALPRAZolam, bisacodyl, morphine injection, ondansetron **OR** ondansetron (ZOFRAN) IV, senna-docusate, tiZANidine Medications Prior to Admission:  Prior to Admission medications   Medication Sig Start Date End Date Taking? Authorizing Provider  acetaminophen (TYLENOL) 325 MG tablet Take 2 tablets (650 mg total) by mouth every 6 (six) hours as needed for mild pain (or Fever >/= 101). 05/01/18  Yes Barrett, Erin R, PA-C  albuterol (PROVENTIL HFA;VENTOLIN HFA) 108 (90 Base) MCG/ACT inhaler Inhale 2 puffs into the lungs every 6 (six) hours as needed for wheezing or shortness of breath. 06/28/18  Yes Lockamy, Randi L, NP-C  albuterol (PROVENTIL) (2.5 MG/3ML) 0.083% nebulizer solution Take 3 mLs (2.5 mg total) by nebulization every 6 (six) hours as needed for wheezing or shortness of breath (if unable to experience relief by using combivent inhaler). 03/08/18  Yes Barton Dubois, MD  ALPRAZolam Duanne Moron) 1 MG tablet Take 1 mg by mouth 2 (two) times daily as needed for anxiety or sleep (takes 1 tablet and bedtime for sleep and 1 dose during the day only if needed).  06/26/16  Yes [provider]  amLODipine (NORVASC) 5 MG tablet Take 5 mg by mouth daily.  05/17/18  Yes [provider]  apixaban (ELIQUIS) 5 MG TABS tablet Take 1 tablet (5 mg total) by mouth 2 (two) times daily. 06/14/18  Yes Lockamy, Randi L, NP-C  Artificial Tear Solution (SOOTHE XP) SOLN Apply 2 drops to eye daily.   Yes [provider]  escitalopram (LEXAPRO) 20 MG tablet Take 20 mg by mouth daily.  05/10/18  Yes [provider]  folic acid (FOLVITE) 1 MG tablet Take 1 tablet (1 mg total) by mouth daily. 06/27/18  Yes Lockamy, Randi L, NP-C  furosemide (LASIX) 20 MG tablet Take 40 mg by mouth daily.   Yes [provider]  gabapentin (NEURONTIN) 600 MG tablet Take 600 mg by mouth 2 (two) times daily.  08/19/17  Yes [provider]  guaiFENesin (MUCINEX) 600 MG 12 hr tablet Take 600 mg by mouth 2 (two) times daily as needed (for congestion.).   Yes [provider]  Ipratropium-Albuterol (COMBIVENT) 20-100 MCG/ACT AERS respimat Inhale 1 puff into the lungs every 6 (six) hours as needed for wheezing or shortness of breath. 03/08/18  Yes Barton Dubois, MD  lactulose (CHRONULAC) 10 GM/15ML solution Take 15 mLs (10 g total) by mouth 3 (three) times daily as needed for mild constipation or moderate constipation. 01/23/18  Yes Burns, Wandra Feinstein, NP  levalbuterol Penne Lash) 0.63 MG/3ML nebulizer solution Take 3 mLs (0.63 mg total) by nebulization every 8 (eight) hours as needed for wheezing or shortness of breath. 05/01/18  Yes Barrett, Erin R, PA-C  methadone (DOLOPHINE) 10 MG tablet Take 20 mg by mouth 5 (five) times daily. Takes 2 tablets 5 times daily 06/26/16  Yes [provider]  mirtazapine (REMERON) 15 MG tablet Take 1 tablet by mouth at bedtime. 08/17/17  Yes [provider]  Multiple Vitamin (MULTIVITAMIN WITH MINERALS) TABS tablet Take 1 tablet by mouth daily. Centrum   Yes [provider]  ondansetron (ZOFRAN) 8 MG tablet TAKE 1 TABLET BY  MOUTH EVERY 8 HOURS AS NEEDED FOR NAUSEA AND VOMITING. Patient taking differently: Take 8 mg by mouth every 8 (eight) hours as needed. TAKE 1 TABLET BY MOUTH EVERY 8 HOURS AS NEEDED FOR NAUSEA AND VOMITING. 03/02/18  Yes Holley Bouche, NP  OXYGEN Inhale 3 L into the lungs daily.    Yes [provider]  pantoprazole (PROTONIX) 40 MG tablet Take 1 tablet (40 mg total) by mouth 2 (two) times daily. 06/14/18  Yes Lockamy, Randi L, NP-C  PEMEtrexed Disodium (ALIMTA IV) Inject into the vein as directed.    Yes [provider]  potassium chloride SA (K-DUR,KLOR-CON) 20 MEQ tablet Take 1 tablet (20 mEq total) by mouth daily. 03/14/18  Yes Derek Jack, MD  prochlorperazine (COMPAZINE) 10 MG tablet Take 1 tablet (10 mg total) by mouth every 6 (six) hours as needed (Nausea or vomiting). 06/28/18  Yes Derek Jack, MD  STIOLTO RESPIMAT 2.5-2.5 MCG/ACT AERS Inhale 1 puff into the lungs 3 (three) times daily. 07/04/18  Yes [provider]  tiZANidine (ZANAFLEX) 4 MG tablet Take 4 mg by mouth every 8 (eight) hours as needed for muscle spasms.  06/03/16  Yes [provider]  apixaban (ELIQUIS) 5 MG TABS tablet Take 1 tablet (5 mg total) by mouth 2 (two) times daily. Patient not taking: Reported on 07/11/2018 06/14/18   Francene Finders L, NP-C  CARBOPLATIN IV Inject into the vein.    [provider]  loperamide (IMODIUM) 2 MG capsule Take 1 capsule (2 mg total) by mouth as needed for diarrhea or loose stools. Patient not taking: Reported on 07/04/2018 04/08/18   Patrecia Pour, MD  Misc. Devices MISC POC at 2 lpm via [place] with conserver / pulse ox on exertion. 05/03/18   Higgs, Mathis Dad, MD  sucralfate (CARAFATE) 1 GM/10ML suspension carafate 1gm/70ml & viscous Lidocaine 2% 1:1 mixture.  Swish and swallow 1 tablespoon four times a day. Patient not taking: Reported on 07/15/2018 07/10/18   Derek Jack, MD  varenicline (CHANTIX) 0.5 MG tablet TAKE (2) TABLETS BY  MOUTH TWICE DAILY. Patient not taking: Reported on 07/11/2018 04/11/18   Holley Bouche, NP   Allergies  Allergen Reactions  . Demeclocycline Swelling    SWELLING REACTION UNSPECIFIED   . Tetracyclines & Related Swelling    SWELLING REACTION UNSPECIFIED    Review of Systems  Unable to perform ROS: Acuity of condition    Physical Exam  Constitutional: No distress.  HENT:  Head: Atraumatic.  Cardiovascular: Normal rate.  Pulmonary/Chest: Effort normal. No respiratory distress.  Abdominal: Soft.  Musculoskeletal: He exhibits no edema.  Skin: Skin is warm and dry.  Nursing note and vitals reviewed.   Vital Signs: BP (!) 100/58   Pulse (!) 116   Temp 98.1 F (36.7 C) (Oral)   Resp 18   Ht 6' (1.829 m)   Wt 98.1 kg   SpO2 97%   BMI 29.33 kg/m  Pain Scale: 0-10   Pain Score: 0-No pain   SpO2: SpO2: 97 % O2 Device:SpO2: 97 % O2 Flow Rate: .O2 Flow Rate (L/min): 3 L/min  IO: Intake/output summary:   Intake/Output Summary (Last 24 hours) at  07/17/2018 1235 Last data filed at 07/17/2018 0500 Gross per 24 hour  Intake 1108 ml  Output 1850 ml  Net -742 ml    LBM: Last BM Date: 07/16/18 Baseline Weight: Weight: 99.8 kg Most recent weight: Weight: 98.1 kg     Palliative Assessment/Data:   Flowsheet Rows     Most Recent Value  Intake Tab  Referral Department  Hospitalist  Unit at Time of Referral  Med/Surg Unit  Palliative Care Primary Diagnosis  Cancer  Date Notified  07/16/18  Palliative Care Type  New Palliative care  Reason for referral  Pain, Clarify Goals of Care  Date of Admission  07/15/18  Date first seen by Palliative Care  07/17/18  # of days Palliative referral response time  1 Day(s)  # of days [name] prior to Palliative referral  1  Clinical Assessment  Palliative Performance Scale Score  40%  Pain Max last 24 hours  Not able to report  Pain Min Last 24 hours  Not able to report  Dyspnea Max Last 24 Hours  Not able to report  Dyspnea Min Last 24  hours  Not able to report  Psychosocial & Spiritual Assessment  Palliative Care Outcomes  Patient/Family meeting held?  Yes  Who was at the meeting?  pt, sister and brother at bedside.   Palliative Care Outcomes  Counseled regarding hospice, Provided advance care planning, Provided psychosocial or spiritual support, Clarified goals of care  Patient/Family wishes: Interventions discontinued/not started   Mechanical Ventilation, Tube feedings/TPN, PEG      Time In: 1040 Time Out: 1130 Time Total: 50 minutes Greater than 50%  of this time was spent counseling and coordinating care related to the above assessment and plan.  Signed by: Drue Novel, NP   Please contact Palliative Medicine Team phone at [phone] for questions and concerns.  For individual provider: See Shea Evans

## 2018-07-17 NOTE — Care Management Note (Addendum)
Case Management Note  Patient Details  Name: Erik [name] MRN: [id number] Date of Birth: [birth year]  Subjective/Objective:      Hemoptysis and odynophagia in the setting of right lung adenocarcinoma with prior radiation and recent initiation of chemotherapy.               Action/Plan: Oncology consulted. Palliative consulted. CM following for needs.  Expected Discharge Date:  07/18/18               Expected Discharge Plan:     In-House Referral:     Discharge planning Services   CM consult, Hospice/ Palliative  Post Acute Care Choice:    Choice offered to:     DME Arranged:    DME Agency:     HH Arranged:    HH Agency:     Status of Service:   in process, will continue to follow  If discussed at Long Length of Stay Meetings, dates discussed:    Additional Comments:  Erik [name], Erik Reading, RN 07/17/2018, 2:06 PM

## 2018-07-17 NOTE — Consult Note (Signed)
[facility] Consultation Oncology  Name: Erik [name]      MRN: [id number]    Location: A315/A315-01  Date: 07/17/2018 Time:6:48 PM   REFERRING PHYSICIAN: Dr. Manuella Ghazi  REASON FOR CONSULT: Severe pancytopenia from chemotherapy.   DIAGNOSIS: Bone marrow suppression from chemotherapy in a patient with metastatic lung cancer.  HISTORY OF PRESENT ILLNESS: Erik [name] is seen in consultation today for further work-up and management of severe pancytopenia.  He is a known patient to me with a stage IV adenocarcinoma of the right lung.  He initially presented with stage III disease and was treated with cisplatin and VP-16 with radiation followed by durvalumab from 12/02/2016 through 06/30/2017.  He was later treated with Tecentriq from 08/04/2017 through 02/16/2018.  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  He developed metastatic disease to the left supraclavicular adenopathy noted in May 2019, confirmed with needle biopsy on 05/15/2018.  After his wound VAC was removed, he was treated with first cycle of carboplatin and pemetrexed on 07/04/2018.  He also received Neulasta injection next day.  He has developed severe mucositis with difficulty swallowing and eating.  He also coughed up some blood.  He came to the emergency room over the weekend.  On 07/15/2018 he had a platelet count of 8000 and white count of 0.7.  Hemoglobin was 7.3.  As result he was admitted to the hospital and received blood and platelet transfusion.  Overall his strength has improved.  He is able to eat some soups and soft foods at this time.  He is also able to drink Ensure and boost.  He reports that coughing up blood has improved.  PAST MEDICAL HISTORY:   Past Medical History:  Diagnosis Date  . 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 Arthritis   . Asthma   . Chronic back pain   . COPD (chronic obstructive pulmonary disease) ([place])   . Depression   .  Dyspnea    with exertion   . GERD (gastroesophageal reflux disease)   . Hypertension   . Pneumonia   . Pulmonary embolus ([place])   . Seasonal allergies   . Spinal stenosis of lumbar region   . 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 Wheezing     ALLERGIES: Allergies  Allergen Reactions  . Demeclocycline Swelling    SWELLING REACTION UNSPECIFIED   . Tetracyclines & Related Swelling    SWELLING REACTION UNSPECIFIED       MEDICATIONS: I have reviewed the patient's current medications.     PAST SURGICAL HISTORY Past Surgical History:  Procedure Laterality Date  . APPLICATION OF WOUND VAC  04/19/2018   Procedure: APPLICATION OF WOUND VAC;  Surgeon: [name], MD;  Location: [place];  Service: Vascular;;  . APPLICATION OF WOUND VAC Right 04/21/2018   Procedure: WOUND VAC CHANGE;  Surgeon: [name], MD;  Location: [place];  Service: Thoracic;  Laterality: Right;  . APPLICATION OF WOUND VAC N/A 04/24/2018   Procedure: WOUND VAC CHANGE;  Surgeon: [name], MD;  Location: [place];  Service: Thoracic;  Laterality: N/A;  . BACK SURGERY     5 total  . BIOPSY  01/03/2017   Procedure: BIOPSY;  Surgeon: Daneil Dolin, MD;  [facility];  Service: Endoscopy;;  gastric  . CHEST TUBE INSERTION Right 04/19/2018   Procedure: CHEST TUBE INSERTION;  Surgeon: [name], MD;  [facility];  Service: Vascular;  Laterality: Right;  . CHEST TUBE INSERTION Right 04/19/2018   Procedure: CHEST TUBE INSERTION;  Surgeon: [name], MD;  Location: [place];  Service: Thoracic;  Laterality: Right;  . COLONOSCOPY WITH PROPOFOL N/A 04/04/2017   Procedure: COLONOSCOPY WITH PROPOFOL;  Surgeon: Daneil Dolin, MD;  [facility];  Service: Endoscopy;  Laterality: N/A;  8:45am  . DECORTICATION Right 03/28/2018   Procedure: DECORTICATION;  Surgeon: [name], MD;  Location: [facility] OR;  Service: Thoracic;  Laterality:  Right;  . ESOPHAGOGASTRODUODENOSCOPY (EGD) WITH PROPOFOL N/A 01/03/2017   Procedure: ESOPHAGOGASTRODUODENOSCOPY (EGD) WITH PROPOFOL;  Surgeon: Daneil Dolin, MD;  [facility];  Service: Endoscopy;  Laterality: N/A;  . I&D EXTREMITY Right 04/19/2018   Procedure: IRRIGATION AND DEBRIDEMENT RIGHT CHEST WOUND;  Surgeon: [name], MD;  Location: [place];  Service: Vascular;  Laterality: Right;  . MALONEY DILATION N/A 01/03/2017   Procedure: Venia Minks DILATION;  Surgeon: Daneil Dolin, MD;  [facility];  Service: Endoscopy;  Laterality: N/A;  . MULTIPLE EXTRACTIONS WITH ALVEOLOPLASTY N/A 07/08/2014   Procedure: MULTIPLE EXTRACION WITH ALVEOLOPLASTY with EXCISION LESION RIGHT SIDE OF TONGUE;  Surgeon: Gae Bon, DDS;  Location: [place];  Service: Oral Surgery;  Laterality: N/A;  . PORTACATH PLACEMENT Right 07/30/2016   Procedure: INSERTION PORT-A-CATH;  Surgeon: Vickie Epley, MD;  Location: [facility];  Service: Vascular;  Laterality: Right;  . THORACOTOMY  04/19/2018   Procedure: THORACOTOMY MAJOR;  Surgeon: [name], MD;  Location: [place];  Service: Vascular;;  . VIDEO ASSISTED THORACOSCOPY Right 04/21/2018   Procedure: VIDEO ASSISTED THORACOSCOPY;  Surgeon: [name], MD;  Location: [place];  Service: Thoracic;  Laterality: Right;  [name] VIDEO ASSISTED THORACOSCOPY Right 04/24/2018   Procedure: VIDEO ASSISTED THORACOSCOPY;  Surgeon: [name], MD;  Location: [place];  Service: Thoracic;  Laterality: Right;  [name] VIDEO ASSISTED THORACOSCOPY (VATS)/EMPYEMA Right 03/28/2018   Procedure: VIDEO ASSISTED THORACOSCOPY (VATS)/EMPYEMA;  Surgeon: [name], MD;  Location: [place];  Service: Thoracic;  Laterality: Right;  [name] VIDEO BRONCHOSCOPY WITH ENDOBRONCHIAL ULTRASOUND N/A 07/19/2016   Procedure: VIDEO BRONCHOSCOPY WITH ENDOBRONCHIAL ULTRASOUND;  Surgeon: [name], MD;  Location: [place];  Service: Thoracic;  Laterality: N/A;     FAMILY HISTORY: Family History  Problem Relation Age of Onset  . Cancer Mother        breast cancer  . Heart failure Brother   . Colon cancer Neg Hx        not sure, ?grandfather and/or uncle    SOCIAL HISTORY:  reports that he quit smoking about 4 months ago. His smoking use included cigarettes. He has a 9.50 pack-year smoking history. He has quit using smokeless tobacco.  His smokeless tobacco use included chew. He reports that he drinks about 2.0 standard drinks of alcohol per week. He reports that he has current or past drug history. Drug: Marijuana. Frequency: 7.00 times per week.  PERFORMANCE STATUS: The patient's performance status is 2 - Symptomatic, <50% confined to bed  PHYSICAL EXAM: Most Recent Vital Signs: Blood pressure 98/69, pulse (!) 117, temperature 97.7 F (36.5 C), temperature source Oral, resp. rate 18, height 6' (1.829 m), weight 216 lb 4.3 oz (98.1 kg), SpO2 99 %. BP 98/69 (BP Location: Right Arm)   Pulse (!) 117   Temp 97.7 F (36.5 C) (Oral)   Resp 18   Ht 6' (1.829  m)   Wt 216 lb 4.3 oz (98.1 kg)   SpO2 99%   BMI 29.33 kg/m  General appearance: alert and cooperative Throat: lips, mucosa, and tongue normal; teeth and gums normal Lungs: clear to auscultation bilaterally decreased breath sound at right base. Heart: regular rate and rhythm Abdomen: soft, non-tender; bowel sounds normal; no masses,  no organomegaly Extremities: extremities normal, atraumatic, no cyanosis or edema Skin: Skin color, texture, turgor normal. No rashes or lesions  LABORATORY DATA:  Results for orders placed or performed during the hospital encounter of 07/15/18 (from the past 48 hour(s))  Type and screen [facility]     Status: None   Collection Time: 07/15/18  7:11 PM  Result Value Ref Range   ABO/RH(D) O NEG    Antibody Screen NEG    Sample Expiration 07/18/2018    Unit Number [id number]    Blood Component Type RED CELLS,LR    Unit division 00     Status of Unit ISSUED,FINAL    Transfusion Status OK TO TRANSFUSE    Crossmatch Result Compatible    Unit Number [id number]    Blood Component Type RBC LR PHER2    Unit division 00    Status of Unit ISSUED,FINAL    Transfusion Status OK TO TRANSFUSE    Crossmatch Result      Compatible Performed at [facility], [address] [place], [place] [address]    Unit Number [id number]    Blood Component Type RED CELLS,LR    Unit division 00    Status of Unit ISSUED,FINAL    Transfusion Status OK TO TRANSFUSE    Crossmatch Result Compatible   Prepare RBC     Status: None   Collection Time: 07/15/18  7:11 PM  Result Value Ref Range   Order Confirmation      ORDER PROCESSED BY BLOOD BANK Performed at [facility], [address], [place], [place] [address]   Prepare Pheresed Platelets     Status: None   Collection Time: 07/15/18  7:11 PM  Result Value Ref Range   Unit Number [id number]    Blood Component Type PLTP LR1 PAS    Unit division 00    Status of Unit ISSUED,FINAL    Transfusion Status      OK TO TRANSFUSE Performed at [facility], [address], [place], [place] [address]   ABO/Rh     Status: None   Collection Time: 07/15/18  7:11 PM  Result Value Ref Range   ABO/RH(D)      O NEG Performed at [facility], [address], [place], [place] [address]   Blood gas, arterial     Status: Abnormal   Collection Time: 07/15/18 11:14 PM  Result Value Ref Range   FIO2 100.00    Delivery systems NON-REBREATHER OXYGEN MASK    pH, Arterial 7.388 7.350 - 7.450   pCO2 arterial 43.9 32.0 - 48.0 mmHg   pO2, Arterial 206 (H) 83.0 - 108.0 mmHg   Bicarbonate 25.5 20.0 - 28.0 mmol/L   Acid-Base Excess 1.4 0.0 - 2.0 mmol/L   O2 Saturation 99.1 %   Patient temperature 37.0    Collection site RIGHT RADIAL    Drawn by [id number]    Sample type ARTERIAL DRAW    Allens test (pass/fail) PASS PASS    Comment: Performed at [facility], [address] [place], [place] [address]  MRSA  PCR Screening     Status: None   Collection Time:  07/16/18  2:24 AM  Result Value Ref Range   MRSA by PCR NEGATIVE NEGATIVE    Comment:        The GeneXpert MRSA Assay (FDA approved for NASAL specimens only), is one component of a comprehensive MRSA colonization surveillance program. It is not intended to diagnose MRSA infection nor to guide or monitor treatment for MRSA infections. Performed at [facility], [address], [place], [place] [address]   Basic metabolic panel     Status: Abnormal   Collection Time: 07/16/18  4:23 AM  Result Value Ref Range   Sodium 135 135 - 145 mmol/L   Potassium 3.6 3.5 - 5.1 mmol/L   Chloride 103 98 - 111 mmol/L   CO2 26 22 - 32 mmol/L   Glucose, Bld 156 (H) 70 - 99 mg/dL   BUN 21 (H) 6 - 20 mg/dL   Creatinine, Ser 0.65 0.61 - 1.24 mg/dL   Calcium 7.8 (L) 8.9 - 10.3 mg/dL   GFR calc non Af Amer >60 >60 mL/min   GFR calc Af Amer >60 >60 mL/min    Comment: (NOTE) The eGFR has been calculated using the CKD EPI equation. This calculation has not been validated in all clinical situations. eGFR's persistently <60 mL/min signify possible Chronic Kidney Disease.    Anion gap 6 5 - 15    Comment: Performed at [facility], [address] [place], [place] [address]  CBC     Status: Abnormal   Collection Time: 07/16/18  4:23 AM  Result Value Ref Range   WBC 0.9 (LL) 4.0 - 10.5 K/uL    Comment: REPEATED TO VERIFY CRITICAL RESULT CALLED TO, READ BACK BY AND VERIFIED WITH: GAMMONS @ 554 ON [id number] BY HENDERSON L.    RBC 2.27 (L) 4.22 - 5.81 MIL/uL   Hemoglobin 6.8 (LL) 13.0 - 17.0 g/dL    Comment: REPEATED TO VERIFY CRITICAL RESULT CALLED TO, READ BACK BY AND VERIFIED WITH: GAMMONS @ 554 ON [id number] BY HENDERSON L.    HCT 20.7 (L) 39.0 - 52.0 %   MCV 91.2 78.0 - 100.0 fL   MCH 30.0 26.0 - 34.0 pg   MCHC 32.9 30.0 - 36.0 g/dL   RDW 15.7 (H) 11.5 - 15.5 %   Platelets 13 (LL) 150 - 400 K/uL    Comment: REPEATED TO VERIFY CRITICAL RESULT  CALLED TO, READ BACK BY AND VERIFIED WITH: GAMMONS @ 554 ON [id number] BY HENDERSON L. SPECIMEN CHECKED FOR CLOTS CONSISTENT WITH PREVIOUS RESULT Performed at [facility], [address], [place], [place] [address]   Prepare RBC     Status: None   Collection Time: 07/16/18  6:01 AM  Result Value Ref Range   Order Confirmation      ORDER PROCESSED BY BLOOD BANK Performed at [facility], [address] [place], [place] [address]   Prepare Pheresed Platelets     Status: None   Collection Time: 07/16/18  6:15 AM  Result Value Ref Range   Unit Number [id number]    Blood Component Type PLTP LR1 PAS    Unit division 00    Status of Unit ISSUED,FINAL    Transfusion Status      OK TO TRANSFUSE Performed at [facility], [address], [place], [place] [address]   Glucose, capillary     Status: Abnormal   Collection Time: 07/16/18  7:42 AM  Result Value Ref Range   Glucose-Capillary 116 (H) 70 - 99 mg/dL  CBC  Status: Abnormal   Collection Time: 07/16/18  6:58 PM  Result Value Ref Range   WBC 0.9 (LL) 4.0 - 10.5 K/uL    Comment: RESULT REPEATED AND VERIFIED CRITICAL RESULT CALLED TO, READ BACK BY AND VERIFIED WITH: LANE,K @ 2017 ON 07/16/18 BY JUW    RBC 2.43 (L) 4.22 - 5.81 MIL/uL   Hemoglobin 7.1 (L) 13.0 - 17.0 g/dL   HCT 22.0 (L) 39.0 - 52.0 %   MCV 90.5 78.0 - 100.0 fL   MCH 29.2 26.0 - 34.0 pg   MCHC 32.3 30.0 - 36.0 g/dL   RDW 16.0 (H) 11.5 - 15.5 %   Platelets 16 (LL) 150 - 400 K/uL    Comment: RESULT REPEATED AND VERIFIED CRITICAL RESULT CALLED TO, READ BACK BY AND VERIFIED WITH: LANE,K @ 2017 ON 07/16/18 BY JUW Performed at [facility], [address] [place], [place] [address]   Prepare RBC     Status: None   Collection Time: 07/16/18  8:40 PM  Result Value Ref Range   Order Confirmation      ORDER PROCESSED BY BLOOD BANK Performed at [facility], [address] [place], [place] [address]   Prepare Pheresed Platelets     Status: None (Preliminary result)    Collection Time: 07/16/18  8:40 PM  Result Value Ref Range   Unit Number [id number]    Blood Component Type PLTPHER LR1    Unit division 00    Status of Unit ISSUED    Transfusion Status      OK TO TRANSFUSE Performed at [facility], [address], [place], [place] [address]   CBC with Differential/Platelet     Status: Abnormal   Collection Time: 07/17/18  4:34 AM  Result Value Ref Range   WBC 1.5 (L) 4.0 - 10.5 K/uL   RBC 2.61 (L) 4.22 - 5.81 MIL/uL   Hemoglobin 7.6 (L) 13.0 - 17.0 g/dL   HCT 23.8 (L) 39.0 - 52.0 %   MCV 91.2 78.0 - 100.0 fL   MCH 29.1 26.0 - 34.0 pg   MCHC 31.9 30.0 - 36.0 g/dL   RDW 15.9 (H) 11.5 - 15.5 %   Platelets 31 (L) 150 - 400 K/uL   Neutrophils Relative % 27 %   Neutro Abs 0.4 1.7 - 7.7 K/uL   Lymphocytes Relative 45 %   Lymphs Abs 0.7 0.7 - 4.0 K/uL   Monocytes Relative 27 %   Monocytes Absolute 0.4 0.1 - 1.0 K/uL   Eosinophils Relative 1 %   Eosinophils Absolute 0.0 0.0 - 0.7 K/uL   Basophils Relative 0 %   Basophils Absolute 0.0 0.0 - 0.1 K/uL   WBC Morphology ATYPICAL LYMPHOCYTES     Comment: Performed at [facility], [address] [place], [place] [address]  Glucose, capillary     Status: Abnormal   Collection Time: 07/17/18  8:05 AM  Result Value Ref Range   Glucose-Capillary 105 (H) 70 - 99 mg/dL      RADIOGRAPHY: Dg Chest Port 1 View  Result Date: 07/15/2018 CLINICAL DATA:  57 y/o M; increased difficulty of breathing and decreased oxygen saturation. History of lung cancer. EXAM: PORTABLE CHEST 1 VIEW COMPARISON:  07/15/2018 chest radiograph. FINDINGS: Stable near complete opacification of the right hemithorax with volume loss and clear left lung given projection and technique right port catheter tip projects over lower SVC. No left-sided effusion. Stable cardiac silhouette with stable rightward mediastinal deviation from volume loss in the right hemithorax.  No acute osseous abnormality is evident. IMPRESSION: Stable near complete  opacification of right hemithorax with volume loss and clear left lung given projection and technique. Electronically Signed   By: Kristine Garbe M.D.   On: 07/15/2018 23:23         ASSESSMENT and plan: 1. Severe pancytopenia: - This is from myelosuppression from chemotherapy.  He received first cycle of carboplatin and pemetrexed on 07/04/2018.  He also received Neulasta on 07/05/2018. - His white count improved to 1.5 today.  Platelet count is 31. - His Eliquis is on hold and hemoptysis has improved. - We will repeat his blood counts tomorrow.  If they continue to trend up, he may be discharged home with close follow-up with me in the office.  2.  Metastatic non-small cell lung cancer: - He is on palliative chemotherapy and received his first cycle on 07/04/2018.  Unfortunately he had severe myelosuppression from the chemotherapy. -We will decrease his dose significantly during second cycle.  This will help prevent future admissions and improve his quality of life. -he was advised to enroll in palliative care.  All questions were answered. The patient knows to call the clinic with any problems, questions or concerns. We can certainly see the patient much sooner if necessary.    Derek Jack

## 2018-07-18 ENCOUNTER — Other Ambulatory Visit (HOSPITAL_COMMUNITY): Payer: Self-pay | Admitting: *Deleted

## 2018-07-18 LAB — PREPARE PLATELET PHERESIS: Unit division: 0

## 2018-07-18 LAB — CBC
HCT: 24.4 % — ABNORMAL LOW (ref 39.0–52.0)
HEMOGLOBIN: 8 g/dL — AB (ref 13.0–17.0)
MCH: 30.3 pg (ref 26.0–34.0)
MCHC: 32.8 g/dL (ref 30.0–36.0)
MCV: 92.4 fL (ref 78.0–100.0)
Platelets: 26 10*3/uL — CL (ref 150–400)
RBC: 2.64 MIL/uL — AB (ref 4.22–5.81)
RDW: 15.6 % — ABNORMAL HIGH (ref 11.5–15.5)
WBC: 1.7 10*3/uL — ABNORMAL LOW (ref 4.0–10.5)

## 2018-07-18 LAB — BPAM PLATELET PHERESIS
Blood Product Expiration Date: 201908122359
ISSUE DATE / TIME: 201908120020
Unit Type and Rh: 5100

## 2018-07-18 LAB — BASIC METABOLIC PANEL
ANION GAP: 7 (ref 5–15)
BUN: 9 mg/dL (ref 6–20)
CHLORIDE: 99 mmol/L (ref 98–111)
CO2: 30 mmol/L (ref 22–32)
Calcium: 8.3 mg/dL — ABNORMAL LOW (ref 8.9–10.3)
Creatinine, Ser: 0.56 mg/dL — ABNORMAL LOW (ref 0.61–1.24)
GFR calc Af Amer: >60 mL/min (ref >60)
GFR calc non Af Amer: >60 mL/min (ref >60)
Glucose, Bld: 107 mg/dL — ABNORMAL HIGH (ref 70–99)
POTASSIUM: 3.7 mmol/L (ref 3.5–5.1)
SODIUM: 136 mmol/L (ref 135–145)

## 2018-07-18 LAB — GLUCOSE, CAPILLARY: Glucose-Capillary: 103 mg/dL — ABNORMAL HIGH (ref 70–99)

## 2018-07-18 MED ORDER — BISACODYL 5 MG PO TBEC: 5.0000 mg | DELAYED_RELEASE_TABLET | Freq: Every day | ORAL | 0 refills | Status: AC | PRN

## 2018-07-18 MED ORDER — SUCRALFATE 1 GM/10ML PO SUSP: ORAL | 2 refills | Status: AC

## 2018-07-18 NOTE — Progress Notes (Signed)
DC home today. Order placed for suction and [facility] nursing. Pt chose [place] for suctions. Order faxed and will be delivered to pt's home. Vaughan Basta, [facility] rep, given [facility] referral, pt aware HH has 48 hrs to make first visit.

## 2018-07-18 NOTE — Discharge Summary (Signed)
Physician Discharge Summary  [name] YIR:[id number] DOB: 1961/08/29 DOA: 07/15/2018  PCP: [name], MD  Admit date: 07/15/2018  Discharge date: 07/18/2018  Admitted From:Home  Disposition:  Home  Recommendations for Outpatient Follow-up:  1. Follow up with PCP in 1-2 weeks 2. Follow up with Dr. 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    He was noted to have pancytopenia after recent initiation of chemotherapy for which she was transfused 1 unit of PRBC and platelets with improvement in his CBC noted.  He denies any further significant symptomatology at this time.  Oncology and palliative care consultations obtained with recommendations to continue chemotherapy in the outpatient setting for now with possible home hospice set up in the near future.  Discharge Diagnoses:  Principal Problem:   Hemoptysis Active Problems:   Depression with anxiety   Adenocarcinoma of right lung (HCC)   Radiation-induced esophagitis   COPD (chronic obstructive pulmonary disease) (HCC)   Acute on chronic respiratory failure with hypoxia (HCC)   History of pulmonary embolism   Antineoplastic chemotherapy induced pancytopenia ([place])   DNR (do not resuscitate)  discussion   Encounter for hospice care discussion  1. Hemoptysis and odynophagia in the setting of right lung adenocarcinoma with prior radiation and recent initiation of chemotherapy-resolved.  Symptoms are currently persistent and related to pancytopenia.    Continue to further with hold Eliquis at this time until he is followed up with hematology in the outpatient setting.  Repeat CBC in 1 week. 2. Acute on chronic hypoxemic respiratory failure secondary to above-resolved.  Continue supplemental oxygen and wean as tolerated to his usual 2 to 3 L/min at home.  Continue as needed nebulizer treatments. 3. Pancytopenia-improved.  Status post PRBC and platelet transfusion.    Further recommendations in the out patient setting per hematology/oncology 4. History of PE.  Continue to withhold Eliquis.  No PE on CTA from 07/11/2018 5. Chronic pain related to cancer.  Continue methadone, gabapentin, PRN tizanidine, and PRN laxatives. 6. Depression and anxiety.  Currently stable.  Continue Lexapro, Remeron, and Xanax. 7. COPD.  Continue inhalers as needed along with supplemental oxygen. 8. Hypertension.    Continue home antihypertensives..  Discharge Instructions  Discharge Instructions    Diet - low sodium heart healthy   Complete by:  As directed    For home use only DME Suction   Complete by:  As directed    Suction:  Oral   Increase activity slowly   Complete by:  As directed      Allergies as of 07/18/2018      Reactions   Demeclocycline Swelling   SWELLING REACTION UNSPECIFIED    Tetracyclines & Related Swelling   SWELLING REACTION UNSPECIFIED      Medication List    STOP taking these medications   apixaban 5 MG Tabs tablet Commonly known as:  ELIQUIS     TAKE these medications   acetaminophen 325 MG tablet Commonly known as:  TYLENOL Take 2 tablets (650 mg total) by mouth every 6 (six) hours as needed for mild pain (or Fever >/= 101).   albuterol (2.5 MG/3ML) 0.083% nebulizer  solution Commonly known as:  PROVENTIL Take 3 mLs (2.5 mg total) by nebulization every 6 (six) hours as needed for wheezing or shortness of breath (if unable to experience relief by using combivent inhaler).   albuterol 108 (90 Base) MCG/ACT inhaler Commonly known as:  PROVENTIL HFA;VENTOLIN HFA Inhale 2 puffs into the lungs every 6 (six) hours as needed for wheezing or shortness of breath.   ALIMTA IV Inject into the vein as directed.   ALPRAZolam 1 MG tablet Commonly known as:  XANAX Take 1 mg by mouth 2 (two) times daily as needed for anxiety or sleep (takes 1 tablet and bedtime for sleep and 1 dose during the day only if needed).   amLODipine 5 MG tablet Commonly known as:  NORVASC Take 5 mg by mouth daily.   bisacodyl 5 MG EC tablet Commonly known as:  DULCOLAX Take 1 tablet (5 mg total) by mouth daily as needed for moderate constipation.   CARBOPLATIN IV Inject into the vein.   escitalopram 20 MG tablet Commonly known as:  LEXAPRO Take 20 mg by mouth daily.   folic acid 1 MG tablet Commonly known as:  FOLVITE Take 1 tablet (1 mg total) by mouth daily.   furosemide 20 MG tablet Commonly known as:  LASIX Take 40 mg by mouth daily.   gabapentin 600 MG tablet Commonly known as:  NEURONTIN Take 600 mg by mouth 2 (two) times daily.   guaiFENesin 600 MG 12 hr tablet Commonly known as:  MUCINEX Take 600 mg by mouth 2 (two) times daily as needed (for congestion.).   Ipratropium-Albuterol 20-100 MCG/ACT Aers respimat Commonly known as:  COMBIVENT Inhale 1 puff into the lungs every 6 (six) hours as needed for wheezing or shortness of breath.   lactulose 10 GM/15ML solution Commonly known as:  CHRONULAC Take 15 mLs (10 g total) by mouth 3 (three) times daily as needed for mild constipation or moderate constipation.   levalbuterol 0.63 MG/3ML nebulizer solution Commonly known as:  XOPENEX Take 3 mLs (0.63 mg total) by nebulization every 8 (eight) hours as needed for  wheezing or shortness of breath.   loperamide 2 MG capsule Commonly known as:  IMODIUM Take 1 capsule (2 mg total) by mouth as needed for diarrhea or loose stools.   methadone 10 MG tablet Commonly known as:  DOLOPHINE Take 20 mg by mouth 5 (five) times daily. Takes 2 tablets 5 times daily   mirtazapine 15 MG tablet Commonly known as:  REMERON Take 1 tablet by mouth at bedtime.   Misc. Devices Misc POC at 2 lpm via [place] with conserver / pulse ox on exertion.   multivitamin with minerals Tabs tablet Take 1 tablet by mouth daily. Centrum   ondansetron 8 MG tablet Commonly known as:  ZOFRAN TAKE 1 TABLET BY MOUTH EVERY 8 HOURS AS NEEDED FOR NAUSEA AND VOMITING. What changed:  See the new instructions.   OXYGEN Inhale 3 L into the lungs daily.   pantoprazole 40 MG tablet Commonly known as:  PROTONIX Take 1 tablet (40 mg total) by mouth 2 (two) times daily.   potassium chloride SA 20 MEQ tablet Commonly known as:  K-DUR,KLOR-CON Take 1 tablet (20 mEq total)  by mouth daily.   prochlorperazine 10 MG tablet Commonly known as:  COMPAZINE Take 1 tablet (10 mg total) by mouth every 6 (six) hours as needed (Nausea or vomiting).   SOOTHE XP Soln Apply 2 drops to eye daily.   STIOLTO RESPIMAT 2.5-2.5 MCG/ACT Aers Generic drug:  Tiotropium Bromide-Olodaterol Inhale 1 puff into the lungs 3 (three) times daily.   sucralfate 1 GM/10ML suspension Commonly known as:  CARAFATE carafate 1gm/7m & viscous Lidocaine 2% 1:1 mixture.  Swish and swallow 1 tablespoon four times a day.   tiZANidine 4 MG tablet Commonly known as:  ZANAFLEX Take 4 mg by mouth every 8 (eight) hours as needed for muscle spasms.   varenicline 0.5 MG tablet Commonly known as:  CHANTIX TAKE (2) TABLETS BY MOUTH TWICE DAILY.            Durable Medical Equipment  (From admission, onward)         Start     Ordered   07/18/18 0000  For home use only DME Suction    Question:  Suction  Answer:  Oral    07/18/18 1014         Follow-up Information    KJani Gravel MD Follow up.   Specialty:  Internal Medicine Contact information: [phone]        KDerek Jack MD. Call in 1 week(s).   Specialty:  Hematology Contact information: [id number] [phone]         Allergies  Allergen Reactions  . Demeclocycline Swelling    SWELLING REACTION UNSPECIFIED   . Tetracyclines & Related Swelling    SWELLING REACTION UNSPECIFIED     Consultations:  Hematology/Oncology  Palliative Care   Procedures/Studies: Dg Chest 2 View  Result Date: 07/15/2018 CLINICAL DATA:  Poor p.o. intake due to chemotherapy for lung cancer. EXAM: CHEST - 2 VIEW COMPARISON:  July 11, 2018 chest x-ray FINDINGS: There is near complete opacification of the right chest, unchanged. The right Port-A-Cath is stable. There is relative volume loss on the right. No pneumothorax. The left lung is clear. Visualized cardiomediastinal silhouette is stable. No other interval changes. IMPRESSION: No significant interval change. Near complete opacification of the right chest with volume loss. Electronically Signed   By: DDorise BullionIII M.D   On: 07/15/2018 16:59   Dg Chest 2 View  Result Date: 07/11/2018 CLINICAL DATA:  Left-sided chest pain.  Hemoptysis. EXAM: CHEST - 2 VIEW COMPARISON:  05/31/2018 FINDINGS: Power port tip is unchanged in the right atrium. The left chest remains clear. No change in the appearance of the right hemithorax with volume loss, pleural density and only a small amount of aerated lung in the upper chest. No acute bone finding. IMPRESSION: Left chest is clear. Chronic volume loss in the right hemithorax with pleural and parenchymal density. No new finding. Electronically Signed   By: MNelson ChimesM.D.   On: 07/11/2018 10:39   Ct Chest W Contrast  Result Date: 06/19/2018 CLINICAL DATA:  Restaging right lung  cancer. Initial diagnosis August 2017. Status post radiation and chemotherapy. EXAM: CT CHEST, ABDOMEN, AND PELVIS WITH CONTRAST TECHNIQUE: Multidetector CT imaging of the chest, abdomen and pelvis was performed following the standard protocol during bolus administration of intravenous contrast. CONTRAST:  1021mISOVUE-300 IOPAMIDOL (ISOVUE-300) INJECTION 61% COMPARISON:  Chest CT 01/28/2018 and abdominal/pelvic CT 12/28/2017. FINDINGS: CT CHEST FINDINGS Cardiovascular: The heart is normal in size.  Enlarging pericardial effusion. The aorta is normal in caliber. No dissection. The branch vessels are patent. The Port-A-Cath is stable. Mediastinum/Nodes: Progressive supraclavicular, axillary and subpectoral lymphadenopathy. 13.5 mm left axillary lymph node on image number 17 previously measured 6.5 mm. 16 mm left axillary lymph node on image number 21 previously measured 6 mm. Several other left axillary lymph nodes have enlarged. Right axillary lymph node on image number 12 measures 10 mm and previously measured 6.5 mm. 18.5 mm prevascular lymph node on image number 27 is stable. Mild central necrosis is noted. 10.5 mm right paratracheal node on image number 14 is stable. Subcarinal lymph nodes are stable. Progressive left hilar adenopathy with a 12 mm lymph node on image number 29 previously measuring 5 mm. Diffuse upper esophageal wall thickening may be due to radiation change. Lungs/Pleura: Extensive and progressive dense right apical airspace opacification which could reflect tumor and interstitial spread of disease. It is difficult to see a discrete measurable mass but the soft tissue in the left hilar region posteriorly on image number 30 of series 2 appears more solid and no longer demonstrates air bronchograms. This could be tumor. There is a loculated right pleural fluid collection which contains gas. The pleura demonstrates irregular enhancement and although this could represent a recurrent empyema a  bronchopleural fistula is possible also. The left lung demonstrates emphysematous changes and bronchial wall thickening. No infiltrate or obvious metastatic pulmonary nodules. Musculoskeletal: No significant bony findings. No lytic or sclerotic bone lesions to suggest metastatic disease. CT ABDOMEN PELVIS FINDINGS Hepatobiliary: Again demonstrated are cirrhotic changes involving the liver with right hepatic lobe atrophy, irregular liver contour and prominent hepatic fissures. No focal hepatic lesions to suggest metastatic disease. Gallbladder appears normal. No intra or extrahepatic biliary dilatation. Pancreas: No mass, inflammation or ductal dilatation. Prominent fatty interstices. Spleen: Splenomegaly but no splenic masses. Adrenals/Urinary Tract: The adrenal glands and kidneys are unremarkable. The bladder appears normal. Stomach/Bowel: The stomach, duodenum, small bowel and colon are unremarkable. No acute inflammatory changes, mass lesions or obstructive findings. Vascular/Lymphatic: Minimal scattered atherosclerotic calcifications involving the iliac arteries. The aortic branch vessels are patent. The major venous structures are patent. 14.5 mm celiac axis lymph node on image number 65 previously measured 11.5 mm. 15 mm periportal lymph node on image number 66 is stable. 14 mm inter aortocaval lymph node on image number 70 is stable. Reproductive: The prostate gland and seminal vesicles are unremarkable. Other: No pelvic mass or adenopathy. Small scattered lymph nodes are stable. No inguinal mass or adenopathy. Musculoskeletal: Stable surgical changes involving the lumbar spine. No worrisome bone lesions. IMPRESSION: 1. Progressive supraclavicular, axillary and subpectoral lymphadenopathy. 2. The mediastinal/hilar adenopathy and upper abdominal adenopathy appears relatively stable. 3. Progressive dense right upper lobe airspace opacification and interstitial thickening. Could not exclude progressive tumor.  PET-CT may be helpful for further evaluation. 4. Persistent loculated pleural fluid collection at the right lung base which now contains gas. Findings could be due to persistent/recurrent empyema or bronchopleural fistula. 5. Enlarging pericardial effusion. 6. Stable cirrhotic changes involving the liver without worrisome hepatic lesions. Associated splenomegaly but no ascites. 7. Stable abdominal lymphadenopathy. No new or progressive findings in the abdomen/pelvis. Electronically Signed   By: Marijo Sanes M.D.   On: 06/19/2018 14:43   Ct Angio Chest Pe W And/or Wo Contrast  Result Date: 07/11/2018 CLINICAL DATA:  Hemoptysis.  Right-sided lung carcinoma EXAM: CT ANGIOGRAPHY CHEST WITH CONTRAST TECHNIQUE: Multidetector CT imaging of the chest was performed using the standard  protocol during bolus administration of intravenous contrast. Multiplanar CT image reconstructions and MIPs were obtained to evaluate the vascular anatomy. CONTRAST:  158m ISOVUE-370 IOPAMIDOL (ISOVUE-370) INJECTION 76% COMPARISON:  Chest CT June 19, 2018; chest radiograph July 11, 2018 FINDINGS: Cardiovascular: There is no demonstrable pulmonary embolus. There is no thoracic aortic aneurysm or dissection. There is slight calcification in the proximal left subclavian artery. Other visualized proximal great vessel regions appear normal. There is a moderate pericardial effusion. Port-A-Cath tip is in the superior vena cava. Mediastinum/Nodes: Thyroid appears normal. There are multiple enlarged left axillary lymph nodes, largest measuring 1.7 x 1.7 cm, essentially stable. There are multiple prominent subpectoral and supraclavicular lymph nodes, essentially stable. Largest lymph node in these regions is in the left supraclavicular region measuring 2.1 x 1.3 cm. There remain multiple enlarged mediastinal lymph nodes throughout the chest. There is an enlarged aortopulmonary window node on the left measuring 2.0 x 1.8 cm, essentially stable.  There is extensive subcarinal adenopathy with largest individual lymph node measuring 1.4 x 1.3 cm. There are several borderline prominent lymph nodes in the left hilum. The right hilum is obscured due to mass and consolidation. There may well be adenopathy in this area as well. No esophageal lesions are evident. Lungs/Pleura: Extensive consolidation throughout much of the right lung remains. There is felt to be a combination of airspace consolidation, atelectasis, and mass on the right as well as a small right pleural effusion. The changes on the right are similar to recent CT there is a suspected mass in the right lower lobe measuring approximately 4 x 4 cm which is difficult to discern from other surrounding consolidation and appears grossly stable. There are areas of mild atelectasis on the left. There is no mass or consolidation in the left lung. No left pleural effusion evident. Do a degree of underlying emphysematous change on the left is evident. Upper Abdomen: In the visualized upper abdomen, spleen appears enlarged although incompletely visualized. Liver has a somewhat nodular contour consistent with cirrhosis. Adrenals appear unremarkable bilaterally. Musculoskeletal: No evident blastic or lytic bone lesions. There is degenerative change in the thoracic spine. Review of the MIP images confirms the above findings. IMPRESSION: 1. No evident pulmonary embolus. No thoracic aortic aneurysm or dissection. 2. Extensive opacity on the right due to combination of mass, atelectasis, consolidation as well as right pleural effusion. Appearance of the right lung is essentially stable compared to the previous study. 3. Underlying emphysematous change left lung. No edema or consolidation on the left. No mass or pleural effusion on the left evident. 4.  Essentially stable multifocal adenopathy. 5.  Moderate pericardial effusion, similar to recent CT. 6. Visualized liver shows evidence of cirrhosis. Splenomegaly,  incompletely visualized, also present on recent CT examination. Emphysema (ICD10-J43.9). Electronically Signed   By: WLowella GripIII M.D.   On: 07/11/2018 14:39   Ct Abdomen Pelvis W Contrast  Result Date: 06/19/2018 CLINICAL DATA:  Restaging right lung cancer. Initial diagnosis August 2017. Status post radiation and chemotherapy. EXAM: CT CHEST, ABDOMEN, AND PELVIS WITH CONTRAST TECHNIQUE: Multidetector CT imaging of the chest, abdomen and pelvis was performed following the standard protocol during bolus administration of intravenous contrast. CONTRAST:  1027mISOVUE-300 IOPAMIDOL (ISOVUE-300) INJECTION 61% COMPARISON:  Chest CT 01/28/2018 and abdominal/pelvic CT 12/28/2017. FINDINGS: CT CHEST FINDINGS Cardiovascular: The heart is normal in size. Enlarging pericardial effusion. The aorta is normal in caliber. No dissection. The branch vessels are patent. The Port-A-Cath is stable. Mediastinum/Nodes: Progressive supraclavicular, axillary and  subpectoral lymphadenopathy. 13.5 mm left axillary lymph node on image number 17 previously measured 6.5 mm. 16 mm left axillary lymph node on image number 21 previously measured 6 mm. Several other left axillary lymph nodes have enlarged. Right axillary lymph node on image number 12 measures 10 mm and previously measured 6.5 mm. 18.5 mm prevascular lymph node on image number 27 is stable. Mild central necrosis is noted. 10.5 mm right paratracheal node on image number 14 is stable. Subcarinal lymph nodes are stable. Progressive left hilar adenopathy with a 12 mm lymph node on image number 29 previously measuring 5 mm. Diffuse upper esophageal wall thickening may be due to radiation change. Lungs/Pleura: Extensive and progressive dense right apical airspace opacification which could reflect tumor and interstitial spread of disease. It is difficult to see a discrete measurable mass but the soft tissue in the left hilar region posteriorly on image number 30 of series 2  appears more solid and no longer demonstrates air bronchograms. This could be tumor. There is a loculated right pleural fluid collection which contains gas. The pleura demonstrates irregular enhancement and although this could represent a recurrent empyema a bronchopleural fistula is possible also. The left lung demonstrates emphysematous changes and bronchial wall thickening. No infiltrate or obvious metastatic pulmonary nodules. Musculoskeletal: No significant bony findings. No lytic or sclerotic bone lesions to suggest metastatic disease. CT ABDOMEN PELVIS FINDINGS Hepatobiliary: Again demonstrated are cirrhotic changes involving the liver with right hepatic lobe atrophy, irregular liver contour and prominent hepatic fissures. No focal hepatic lesions to suggest metastatic disease. Gallbladder appears normal. No intra or extrahepatic biliary dilatation. Pancreas: No mass, inflammation or ductal dilatation. Prominent fatty interstices. Spleen: Splenomegaly but no splenic masses. Adrenals/Urinary Tract: The adrenal glands and kidneys are unremarkable. The bladder appears normal. Stomach/Bowel: The stomach, duodenum, small bowel and colon are unremarkable. No acute inflammatory changes, mass lesions or obstructive findings. Vascular/Lymphatic: Minimal scattered atherosclerotic calcifications involving the iliac arteries. The aortic branch vessels are patent. The major venous structures are patent. 14.5 mm celiac axis lymph node on image number 65 previously measured 11.5 mm. 15 mm periportal lymph node on image number 66 is stable. 14 mm inter aortocaval lymph node on image number 70 is stable. Reproductive: The prostate gland and seminal vesicles are unremarkable. Other: No pelvic mass or adenopathy. Small scattered lymph nodes are stable. No inguinal mass or adenopathy. Musculoskeletal: Stable surgical changes involving the lumbar spine. No worrisome bone lesions. IMPRESSION: 1. Progressive supraclavicular,  axillary and subpectoral lymphadenopathy. 2. The mediastinal/hilar adenopathy and upper abdominal adenopathy appears relatively stable. 3. Progressive dense right upper lobe airspace opacification and interstitial thickening. Could not exclude progressive tumor. PET-CT may be helpful for further evaluation. 4. Persistent loculated pleural fluid collection at the right lung base which now contains gas. Findings could be due to persistent/recurrent empyema or bronchopleural fistula. 5. Enlarging pericardial effusion. 6. Stable cirrhotic changes involving the liver without worrisome hepatic lesions. Associated splenomegaly but no ascites. 7. Stable abdominal lymphadenopathy. No new or progressive findings in the abdomen/pelvis. Electronically Signed   By: Marijo Sanes M.D.   On: 06/19/2018 14:43   Dg Chest Port 1 View  Result Date: 07/15/2018 CLINICAL DATA:  57 y/o M; increased difficulty of breathing and decreased oxygen saturation. History of lung cancer. EXAM: PORTABLE CHEST 1 VIEW COMPARISON:  07/15/2018 chest radiograph. FINDINGS: Stable near complete opacification of the right hemithorax with volume loss and clear left lung given projection and technique right port catheter tip projects over  lower SVC. No left-sided effusion. Stable cardiac silhouette with stable rightward mediastinal deviation from volume loss in the right hemithorax. No acute osseous abnormality is evident. IMPRESSION: Stable near complete opacification of right hemithorax with volume loss and clear left lung given projection and technique. Electronically Signed   By: Kristine Garbe M.D.   On: 07/15/2018 23:23     Discharge Exam: Vitals:   07/18/18 0725 07/18/18 0733  BP:    Pulse:    Resp:    Temp:    SpO2: 98% 98%   Vitals:   07/18/18 0500 07/18/18 0605 07/18/18 0725 07/18/18 0733  BP:  125/70    Pulse:  84    Resp:  (!) 22    Temp:  98.6 F (37 C)    TempSrc:  Oral    SpO2:  100% 98% 98%  Weight: 100.9 kg      Height:        General: Pt is alert, awake, not in acute distress Cardiovascular: RRR, S1/S2 +, no rubs, no gallops Respiratory: CTA bilaterally, no wheezing, no rhonchi Abdominal: Soft, NT, ND, bowel sounds + Extremities: no edema, no cyanosis    The results of significant diagnostics from this hospitalization (including imaging, microbiology, ancillary and laboratory) are listed below for reference.     Microbiology: Recent Results (from the past 240 hour(s))  MRSA PCR Screening     Status: None   Collection Time: 07/16/18  2:24 AM  Result Value Ref Range Status   MRSA by PCR NEGATIVE NEGATIVE Final    Comment:        The GeneXpert MRSA Assay (FDA approved for NASAL specimens only), is one component of a comprehensive MRSA colonization surveillance program. It is not intended to diagnose MRSA infection nor to guide or monitor treatment for MRSA infections. Performed at [facility], [address] [address] [place], [place] [id number]      Labs: BNP (last 3 results) Recent Labs    03/06/18 1426  BNP 604.5*   Basic Metabolic Panel: Recent Labs  Lab 07/15/18 1617 07/16/18 0423 07/18/18 0447  NA 135 135 136  K 3.5 3.6 3.7  CL 102 103 99  CO2 28 26 30   GLUCOSE 132* 156* 107*  BUN 16 21* 9  CREATININE 0.74 0.65 0.56*  CALCIUM 8.1* 7.8* 8.3*   Liver Function Tests: No results for input(s): AST, ALT, ALKPHOS, BILITOT, PROT, ALBUMIN in the last 168 hours. No results for input(s): LIPASE, AMYLASE in the last 168 hours. No results for input(s): AMMONIA in the last 168 hours. CBC: Recent Labs  Lab 07/15/18 1617 07/16/18 0423 07/16/18 1858 07/17/18 0434 07/18/18 0447  WBC 0.7* 0.9* 0.9* 1.5* 1.7*  NEUTROABS 0.2*  --   --  0.4  --   HGB 7.3* 6.8* 7.1* 7.6* 8.0*  HCT 22.5* 20.7* 22.0* 23.8* 24.4*  MCV 91.5 91.2 90.5 91.2 92.4  PLT 8* 13* 16* 31* 26*   Cardiac Enzymes: Recent Labs  Lab 07/11/18 1251  TROPONINI <0.03   BNP: Invalid input(s):  POCBNP CBG: Recent Labs  Lab 07/16/18 0742 07/17/18 0805 07/18/18 0752  GLUCAP 116* 105* 103*   D-Dimer No results for input(s): DDIMER in the last 72 hours. Hgb A1c No results for input(s): HGBA1C in the last 72 hours. Lipid Profile No results for input(s): CHOL, HDL, LDLCALC, TRIG, CHOLHDL, LDLDIRECT in the last 72 hours. Thyroid function studies No results for input(s): TSH, T4TOTAL, T3FREE, THYROIDAB in the last 72 hours.  Invalid input(s): FREET3  Anemia work up No results for input(s): VITAMINB12, FOLATE, FERRITIN, TIBC, IRON, RETICCTPCT in the last 72 hours. Urinalysis    Component Value Date/Time   COLORURINE YELLOW 07/15/2018 1729   APPEARANCEUR CLEAR 07/15/2018 1729   LABSPEC 1.013 07/15/2018 1729   PHURINE 7.0 07/15/2018 1729   GLUCOSEU NEGATIVE 07/15/2018 1729   HGBUR MODERATE (A) 07/15/2018 1729   BILIRUBINUR NEGATIVE 07/15/2018 1729   KETONESUR NEGATIVE 07/15/2018 1729   PROTEINUR NEGATIVE 07/15/2018 1729   NITRITE NEGATIVE 07/15/2018 1729   LEUKOCYTESUR NEGATIVE 07/15/2018 1729   Sepsis Labs Invalid input(s): PROCALCITONIN,  WBC,  LACTICIDVEN Microbiology Recent Results (from the past 240 hour(s))  MRSA PCR Screening     Status: None   Collection Time: 07/16/18  2:24 AM  Result Value Ref Range Status   MRSA by PCR NEGATIVE NEGATIVE Final    Comment:        The GeneXpert MRSA Assay (FDA approved for NASAL specimens only), is one component of a comprehensive MRSA colonization surveillance program. It is not intended to diagnose MRSA infection nor to guide or monitor treatment for MRSA infections. Performed at [facility], [address], [place], [place] [address]      Time coordinating discharge: 40 minutes  SIGNED:   Rodena Goldmann, DO [facility] 07/18/2018, 12:13 PM Pager [phone]  If 7PM-7AM, please contact night-coverage [email or web address] Password [email or web address]

## 2018-07-18 NOTE — Progress Notes (Signed)
Removed IV-clean, dry, intact. Reviewed d/c paperwork with patient. Reviewed new medication and where to pick up. Reviewed med schedule. Answered all questions. Wheeled stable patient and belongings to main entrance where he was picked up by sister.

## 2018-07-19 LAB — BPAM PLATELET PHERESIS
Blood Product Expiration Date: 201908112359
ISSUE DATE / TIME: 201908111202
UNIT TYPE AND RH: 5100

## 2018-07-19 LAB — PREPARE PLATELET PHERESIS: Unit division: 0

## 2018-07-21 NOTE — Telephone Encounter (Signed)
Rx was sent to pt's pharmacy.

## 2018-07-24 ENCOUNTER — Other Ambulatory Visit (HOSPITAL_COMMUNITY): Payer: Self-pay | Admitting: Hematology

## 2018-07-25 ENCOUNTER — Inpatient Hospital Stay (HOSPITAL_COMMUNITY): Payer: Medicare Other

## 2018-07-25 ENCOUNTER — Encounter (HOSPITAL_COMMUNITY): Payer: Self-pay | Admitting: Hematology

## 2018-07-25 ENCOUNTER — Other Ambulatory Visit: Payer: Self-pay

## 2018-07-25 ENCOUNTER — Inpatient Hospital Stay (HOSPITAL_COMMUNITY): Payer: Medicare Other | Attending: Hematology | Admitting: Hematology

## 2018-07-25 ENCOUNTER — Encounter: Payer: Medicare Other | Admitting: Thoracic Surgery (Cardiothoracic Vascular Surgery)

## 2018-07-25 ENCOUNTER — Other Ambulatory Visit: Payer: Self-pay | Admitting: Thoracic Surgery (Cardiothoracic Vascular Surgery)

## 2018-07-25 VITALS — BP 141/82 | HR 106 | Temp 98.0°F | Resp 20

## 2018-07-25 VITALS — BP 123/66 | HR 118 | Temp 98.0°F | Resp 20 | Wt 220.5 lb

## 2018-07-25 DIAGNOSIS — Z923 Personal history of irradiation: Secondary | ICD-10-CM | POA: Diagnosis not present

## 2018-07-25 DIAGNOSIS — C3431 Malignant neoplasm of lower lobe, right bronchus or lung: Secondary | ICD-10-CM | POA: Diagnosis not present

## 2018-07-25 DIAGNOSIS — D649 Anemia, unspecified: Secondary | ICD-10-CM | POA: Diagnosis not present

## 2018-07-25 DIAGNOSIS — Z5111 Encounter for antineoplastic chemotherapy: Secondary | ICD-10-CM | POA: Insufficient documentation

## 2018-07-25 DIAGNOSIS — C3491 Malignant neoplasm of unspecified part of right bronchus or lung: Secondary | ICD-10-CM

## 2018-07-25 DIAGNOSIS — Z7689 Persons encountering health services in other specified circumstances: Secondary | ICD-10-CM | POA: Diagnosis not present

## 2018-07-25 DIAGNOSIS — Z87891 Personal history of nicotine dependence: Secondary | ICD-10-CM | POA: Diagnosis not present

## 2018-07-25 DIAGNOSIS — K219 Gastro-esophageal reflux disease without esophagitis: Secondary | ICD-10-CM | POA: Diagnosis not present

## 2018-07-25 DIAGNOSIS — M48061 Spinal stenosis, lumbar region without neurogenic claudication: Secondary | ICD-10-CM | POA: Diagnosis not present

## 2018-07-25 DIAGNOSIS — Z79899 Other long term (current) drug therapy: Secondary | ICD-10-CM | POA: Diagnosis not present

## 2018-07-25 DIAGNOSIS — Z7901 Long term (current) use of anticoagulants: Secondary | ICD-10-CM | POA: Diagnosis not present

## 2018-07-25 DIAGNOSIS — C7951 Secondary malignant neoplasm of bone: Secondary | ICD-10-CM | POA: Diagnosis not present

## 2018-07-25 DIAGNOSIS — Z86711 Personal history of pulmonary embolism: Secondary | ICD-10-CM | POA: Diagnosis not present

## 2018-07-25 DIAGNOSIS — Z9221 Personal history of antineoplastic chemotherapy: Secondary | ICD-10-CM

## 2018-07-25 DIAGNOSIS — F329 Major depressive disorder, single episode, unspecified: Secondary | ICD-10-CM | POA: Diagnosis not present

## 2018-07-25 DIAGNOSIS — J449 Chronic obstructive pulmonary disease, unspecified: Secondary | ICD-10-CM | POA: Diagnosis not present

## 2018-07-25 DIAGNOSIS — I10 Essential (primary) hypertension: Secondary | ICD-10-CM | POA: Insufficient documentation

## 2018-07-25 DIAGNOSIS — M1990 Unspecified osteoarthritis, unspecified site: Secondary | ICD-10-CM | POA: Diagnosis not present

## 2018-07-25 LAB — CBC WITH DIFFERENTIAL/PLATELET
BASOS ABS: 0 10*3/uL (ref 0.0–0.1)
Basophils Relative: 0 %
EOS PCT: 0 %
Eosinophils Absolute: 0 10*3/uL (ref 0.0–0.7)
HEMATOCRIT: 27 % — AB (ref 39.0–52.0)
HEMOGLOBIN: 8.3 g/dL — AB (ref 13.0–17.0)
LYMPHS ABS: 1.3 10*3/uL (ref 0.7–4.0)
LYMPHS PCT: 15 %
MCH: 29.9 pg (ref 26.0–34.0)
MCHC: 30.7 g/dL (ref 30.0–36.0)
MCV: 97.1 fL (ref 78.0–100.0)
Monocytes Absolute: 1.3 10*3/uL — ABNORMAL HIGH (ref 0.1–1.0)
Monocytes Relative: 15 %
NEUTROS ABS: 5.9 10*3/uL (ref 1.7–7.7)
Neutrophils Relative %: 70 %
PLATELETS: 340 10*3/uL (ref 150–400)
RBC: 2.78 MIL/uL — AB (ref 4.22–5.81)
RDW: 17.6 % — ABNORMAL HIGH (ref 11.5–15.5)
WBC: 8.5 10*3/uL (ref 4.0–10.5)

## 2018-07-25 LAB — COMPREHENSIVE METABOLIC PANEL
ALK PHOS: 214 U/L — AB (ref 38–126)
ALT: 39 U/L (ref 0–44)
AST: 44 U/L — AB (ref 15–41)
Albumin: 2.1 g/dL — ABNORMAL LOW (ref 3.5–5.0)
Anion gap: 7 (ref 5–15)
BILIRUBIN TOTAL: 0.6 mg/dL (ref 0.3–1.2)
BUN: 7 mg/dL (ref 6–20)
CALCIUM: 7.8 mg/dL — AB (ref 8.9–10.3)
CHLORIDE: 99 mmol/L (ref 98–111)
CO2: 30 mmol/L (ref 22–32)
Creatinine, Ser: 1.09 mg/dL (ref 0.61–1.24)
GFR calc non Af Amer: >60 mL/min (ref >60)
Glucose, Bld: 130 mg/dL — ABNORMAL HIGH (ref 70–99)
Potassium: 3.2 mmol/L — ABNORMAL LOW (ref 3.5–5.1)
Sodium: 136 mmol/L (ref 135–145)
TOTAL PROTEIN: 8.2 g/dL — AB (ref 6.5–8.1)

## 2018-07-25 MED ORDER — DEXAMETHASONE SODIUM PHOSPHATE 10 MG/ML IJ SOLN
10.0000 mg | Freq: Once | INTRAMUSCULAR | Status: AC
  Administered 2018-07-25: 10 mg via INTRAVENOUS

## 2018-07-25 MED ORDER — SODIUM CHLORIDE 0.9 % IV SOLN
700.0000 mg | Freq: Once | INTRAVENOUS | Status: AC
  Administered 2018-07-25: 700 mg via INTRAVENOUS
  Filled 2018-07-25: qty 20

## 2018-07-25 MED ORDER — CARBOPLATIN CHEMO INJECTION 600 MG/60ML
450.0000 mg | Freq: Once | INTRAVENOUS | Status: AC
  Administered 2018-07-25: 450 mg via INTRAVENOUS
  Filled 2018-07-25: qty 45

## 2018-07-25 MED ORDER — PALONOSETRON HCL INJECTION 0.25 MG/5ML
0.2500 mg | Freq: Once | INTRAVENOUS | Status: AC
  Administered 2018-07-25: 0.25 mg via INTRAVENOUS

## 2018-07-25 MED ORDER — SODIUM CHLORIDE 0.9% FLUSH
10.0000 mL | INTRAVENOUS | Status: DC | PRN
Start: 2018-07-25 — End: 2018-07-25
  Administered 2018-07-25: 10 mL
  Filled 2018-07-25: qty 10

## 2018-07-25 MED ORDER — PALONOSETRON HCL INJECTION 0.25 MG/5ML
INTRAVENOUS | Status: AC
  Filled 2018-07-25: qty 5

## 2018-07-25 MED ORDER — SODIUM CHLORIDE 0.9 % IV SOLN
Freq: Once | INTRAVENOUS | Status: AC
  Administered 2018-07-25: 10:00:00 via INTRAVENOUS

## 2018-07-25 MED ORDER — DEXAMETHASONE SODIUM PHOSPHATE 10 MG/ML IJ SOLN
INTRAMUSCULAR | Status: AC
  Filled 2018-07-25: qty 1

## 2018-07-25 MED ORDER — HEPARIN SOD (PORK) LOCK FLUSH 100 UNIT/ML IV SOLN
500.0000 [IU] | Freq: Once | INTRAVENOUS | Status: AC | PRN
  Administered 2018-07-25: 500 [IU]

## 2018-07-25 NOTE — Progress Notes (Signed)
Patient seen by Dr. Delton Coombes with lab review.  Patients dose will be decreased and return next week for lab review for possible blood transfusion verbal order Dr. Delton Coombes.  Reviewed with the patient and understanding verbalized.    Patient tolerated chemotherapy with no complaints voiced.  Port site clean and dry with no bruising or swelling noted at site.  No blood return but denied pain with flush. Band aid to port site.   VSS with discharge.  Left by wheelchair with no s/s of distress noted.

## 2018-07-25 NOTE — Assessment & Plan Note (Signed)
1.  Stage IV adenocarcinoma of the right lung: - Initially treated with cisplatin and VP-16 for stage III disease followed by Imfinzi from 12/02/2016 through 06/30/2017.  He reportedly had progression during durvalumab treatment. -Tecentriq every 3 weeks from 08/04/2017 through 02/16/2018. 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  Chest tube was removed last week.  He has a follow-up with Dr. Hendrickson tomorrow for possible removal of VAC. - Left supraclavicular adenopathy was noted in May 2019.  Underwent needle biopsy on 05/15/2018 which shows metastatic poorly differentiated adenocarcinoma consistent with his lung primary. - Wound VAC was removed about 2 weeks ago.  His energy levels are low but stable.  We discussed the results of the CT scan of the chest dated 06/19/2018 which showed progressive left supraclavicular, axillary and subpectoral adenopathy.  Mediastinal/hilar adenopathy and upper abdominal adenopathy is relatively stable.  Progressive dense right upper lobe airspace opacification and interstitial thickening.  Persistent loculated pleural fluid collection of the right lung base which now contains gas. - His PDL 1 testing shows TPS score of 50%.  Condition 1 report shows MS-stable, TMB-intermediate, MEK 1 and other mutations.  None of the mutations have any targetable treatments.  Hence I have recommended chemotherapy with carboplatin and pemetrexed. - He received first cycle of carboplatin (AUC 5) and pemetrexed on 07/04/2018.  He was admitted to the hospital with severe mucositis and severe cytopenias.  He required 1 unit of PRBC.  He also had some hemoptysis when his platelet count went into single digits.  His Eliquis was held during that time and was restarted back.  He is energy levels are slowly coming back to normal. - We will cut back on the dose  of his cycle 2 by 33%.  Today his hemoglobin is 8.3.  He will come back in 1 week to check his blood.  If it drops below 8 we will give him transfusion.  He will see Dr. Higgs in 3 weeks prior to his next treatment.  CT scans will be repeated after 3 cycles to evaluate response.  2.  Lower extremity swelling: This is stable.  He is taking Lasix 40 mg daily.  3.  Pulmonary embolism: He is continuing Eliquis.  No major hemoptysis reported.  4.  Normocytic anemia: We have done work-up which included normal SPEP.  B12 and folic acid were normal.  Ferritin was 81 with percent saturation of 11.  We will give him Feraheme infusion along with chemo.  We have discussed side effects in detail. 

## 2018-07-25 NOTE — Patient Instructions (Signed)
[facility] Discharge Instructions for Patients Receiving Chemotherapy  Today you received the following chemotherapy agents alimta and carboplatin.    If you develop nausea and vomiting that is not controlled by your nausea medication, call the clinic.   BELOW ARE SYMPTOMS THAT SHOULD BE REPORTED IMMEDIATELY:  *FEVER GREATER THAN 100.5 F  *CHILLS WITH OR WITHOUT FEVER  NAUSEA AND VOMITING THAT IS NOT CONTROLLED WITH YOUR NAUSEA MEDICATION  *UNUSUAL SHORTNESS OF BREATH  *UNUSUAL BRUISING OR BLEEDING  TENDERNESS IN MOUTH AND THROAT WITH OR WITHOUT PRESENCE OF ULCERS  *URINARY PROBLEMS  *BOWEL PROBLEMS  UNUSUAL RASH Items with * indicate a potential emergency and should be followed up as soon as possible.  Feel free to call the clinic should you have any questions or concerns. The clinic phone number is [phone].  Please show the [facility] at check-in to the Emergency Department and triage nurse.

## 2018-07-25 NOTE — Patient Instructions (Signed)
[place] at [facility] Discharge Instructions  Follow up next week for labs and possible blood transfusion. Then we will see you again in 3 weeks for treatments and labs.    Thank you for choosing [place] at [facility] to provide your oncology and hematology care.  To afford each patient quality time with our provider, please arrive at least 15 minutes before your scheduled appointment time.   If you have a lab appointment with the [facility] please come in thru the  Main Entrance and check in at the main information desk  You need to re-schedule your appointment should you arrive 10 or more minutes late.  We strive to give you quality time with our providers, and arriving late affects you and other patients whose appointments are after yours.  Also, if you no show three or more times for appointments you may be dismissed from the clinic at the providers discretion.     Again, thank you for choosing [facility].  Our hope is that these requests will decrease the amount of time that you wait before being seen by our physicians.       _____________________________________________________________  Should you have questions after your visit to [facility], please contact our office at [phone] between the hours of 8:00 a.m. and 4:30 p.m.  Voicemails left after 4:00 p.m. will not be returned until the following business day.  For prescription refill requests, have your pharmacy contact our office and allow 72 hours.    Cancer Center Support Programs:   > Cancer Support Group  2nd Tuesday of the month 1pm-2pm, Journey Room

## 2018-07-25 NOTE — Progress Notes (Signed)
Patient is to receive an iron infusion with blood transfusion verbal order Dr. Delton Coombes.

## 2018-07-25 NOTE — Progress Notes (Signed)
[place],  [address]   CLINIC:  Medical Oncology/Hematology  PCP:  [name], [place] [address] [phone]   REASON FOR VISIT:  Follow-up for adenocarcinoma of the right lung  CURRENT THERAPY: carboplatin and pemetrexed   BRIEF ONCOLOGIC HISTORY:    Adenocarcinoma of right lung ([facility])   07/01/2016 PET scan    6.3 x 3.7 cm right lower lobe mass, suspicious for primary bronchogenic neoplasm, as described above. Hypermetabolic thoracic nodal metastases, as above. Additional right perihilar hypermetabolism, indeterminate. Associated right middle lobe atelectasis/collapse.    07/19/2016 Procedure    Bronchoscopy with brushings and biopsies and endobronchial ultrasound with mediastinal lymph node aspirations by Dr. Roxan Hockey    07/21/2016 Pathology Results    Lung, biopsy, Right Middle Lobe - LUNG TISSUE WITH SQUAMOUS METAPLASIA. - NO MALIGNANCY IDENTIFIED.    07/21/2016 Pathology Results    FINE NEEDLE ASPIRATION, ENDOSCOPIC (A) LEVEL 7 (SPECIMEN 1 OF 3, COLLECTED ON 07/19/16): MALIGNANT CELLS CONSISTENT WITH ADENOCARCINOMA.    07/21/2016 Pathology Results    FINE NEEDLE ASPIRATION, EBUS, 4R, B (SPECIMEN 2 OF 3, COLLECTED 07/19/16): MALIGNANT CELLS CONSISTENT WITH ADENOCARCINOMA.    07/21/2016 Pathology Results    FINE NEEDLE ASPIRATION, EBUS, BRUSHING, RIGHT MIDDLE LOBE, D (SPECIMEN 3 OF 3, COLLECTED 07/19/16): MALIGNANT CELLS CONSISTENT WITH ADENOCARCINOMA.    07/22/2016 Imaging    MRI brain- No evidence of intracranial metastases.    08/17/2016 - 09/20/2016 Chemotherapy    The patient had palonosetron (ALOXI) injection 0.25 mg, 0.25 mg, Intravenous,  Once, 1 of 1 cycle  CISplatin (PLATINOL) 123 mg in sodium chloride 0.9 % 500 mL chemo infusion, 50 mg/m2 = 123 mg, Intravenous,  Once, 1 of 1 cycle  etoposide (VEPESID) 120 mg in sodium chloride 0.9 % 500 mL chemo infusion, 50 mg/m2 = 120 mg,  Intravenous,  Once, 1 of 1 cycle  fosaprepitant (EMEND) 150 mg, dexamethasone (DECADRON) 12 mg in sodium chloride 0.9 % 145 mL IVPB, , Intravenous,  Once, 1 of 1 cycle  ondansetron (ZOFRAN) 8 mg in sodium chloride 0.9 % 50 mL IVPB, , Intravenous,  Once, 2 of 6 cycles  for chemotherapy treatment.      08/17/2016 -  Radiation Therapy       11/15/2016 Imaging    CT CAP- Interval response to therapy. The previously demonstrated mediastinal and right hilar adenopathy and resulting right middle lobe atelectasis have all improved. 2. No discrete residual lung masses are identified. There is new multifocal ground-glass opacity within the right lower lobe, and to a lesser extent in the left upper lobe. These are probably inflammatory/treatment related. 3. No evidence of abdominopelvic metastatic disease. Stable prominent lymph nodes in the upper abdomen, likely reactive. 4. Decompressed mid SVC without specific signs of SVC occlusion.    12/02/2016 -  Chemotherapy    Imfinzi (durvalumab) immunotherapy every 2 weeks x up to 1 year    01/03/2017 Procedure    EGD by Dr. [name], colonoscopy aborted as "patient forgot to take other half of preparation). Esophagitis. likely radiation-induced ?Dilated.  Erythematous mucosa in the stomach. Biopsied. Normal duodenal bulb and second portion of the duodenum.    02/14/2017 Imaging    CT chest- 1. Development of a small to moderate right-sided pleural effusion with minimal loculation anteriorly. 2. Worsened right-sided aeration with right middle and lower lobe consolidation. As this has a geographic distribution, this could be radiation induced or represent infection. Depending  on clinical concern of progressive disease, thoracentesis and/or PET may be informative. 3. Development of mild right paratracheal adenopathy, most likely reactive. Recommend attention on follow-up. 4. Subtle findings which are highly suspicious for mild cirrhosis. Upper  abdominal adenopathy is similar and likely reactive. 5. Persistent right lower and improved left upper lobe ground-glass opacities are favored to be infectious or inflammatory.    06/01/2017 Imaging    CT chest  IMPRESSION: 1. Evolving radiation changes in the medial right hemithorax. 2. Stable moderate size right pleural effusion without definite nodular components. 3. No signs of local recurrence or progressive metastatic disease. No residual enlarged thoracic lymph nodes. 4. Stable appearance of the visualized upper abdomen with probable cirrhosis and reactive adenopathy in the porta hepatis.    07/15/2017 Procedure    Therapeutic thoracentesis performed today; 1.3 L removed.  Fluid sent for cytology.     07/15/2017 Pathology Results    Pleural fluid NEGATIVE for malignancy by cytology.     09/30/2017 Genetic Testing    [employer] Results: MAP2K1 (MEK1) TP53    10/04/2017 Imaging    CT CAP: IMPRESSION: 1. Paramediastinal radiation change and loculated right pleural effusion appear unchanged from 07/14/2017. 2. Borderline prevascular lymph node within the anterior mediastinum is mildly increased in size from previous exam. 3. There is a new 5 mm lung nodule within the left lower lobe. Nonspecific in appearance. Attention on follow-up imaging advise. Other small nonspecific nodules in the left lung are stable. 4. Morphologic features of the liver compatible with cirrhosis. Enlarged upper abdominal lymph nodes are nonspecific and likely reactive. 5. Stable indeterminate low-attenuation focus in the posterior right liver dome. Previously characterized on MRI from 07/29/2017 as reflecting post treatment changes from external beam radiation to the lung. 6.  Emphysema (ICD10-J43.9).    06/28/2018 -  Chemotherapy    The patient had palonosetron (ALOXI) injection 0.25 mg, 0.25 mg, Intravenous,  Once, 2 of 6 cycles Administration: 0.25 mg (07/04/2018), 0.25 mg  (07/25/2018) pegfilgrastim-cbqv (UDENYCA) injection 6 mg, 6 mg, Subcutaneous, Once, 2 of 6 cycles Administration: 6 mg (07/05/2018) PEMEtrexed (ALIMTA) 1,100 mg in sodium chloride 0.9 % 100 mL chemo infusion, 490 mg/m2 = 1,125 mg, Intravenous,  Once, 2 of 6 cycles Dose modification: 333.3333 mg/m2 (66.7 % of original dose 500 mg/m2, Cycle 3, Reason: Dose Not Tolerated) Administration: 1,100 mg (07/04/2018), 700 mg (07/25/2018) CARBOplatin (PARAPLATIN) 750 mg in sodium chloride 0.9 % 250 mL chemo infusion, 750 mg (100 % of original dose 750 mg), Intravenous,  Once, 2 of 6 cycles Dose modification:   (original dose 750 mg, Cycle 1), 450 mg (original dose 450 mg, Cycle 3, Reason: Dose not tolerated) Administration: 750 mg (07/04/2018), 450 mg (07/25/2018)  for chemotherapy treatment.       CANCER STAGING: Cancer Staging Adenocarcinoma of right lung [facility]) Staging form: Lung, AJCC 7th Edition - Clinical stage from 07/23/2016: Stage IIIA (T2b, N2, M0) - Signed by Baird Cancer, PA-C on 07/23/2016 - Clinical stage from 07/28/2017: Stage IV (M1a) - Signed by Holley Bouche, NP on 07/28/2017    INTERVAL HISTORY:  Mr. Torosyan 57 y.o. male returns for routine follow-up for adenocarcinoma of the lung and consideration for next cycle of chemotherapy. Patient is here today and still recovering from his hospitalization. He did not tolerate the full dose of chemo well. His skin is peeling. His mouth sores have healed. He is still SOB with exertion. He has bilateral lower leg edema. He is taking lasix  daily for this. Patient reports his appetite is decreased to 25%. He is drinking ensure or boost daily to help maintain his weight. Patient denies any new pains. Denies any vomiting or diarrhea.       REVIEW OF SYSTEMS:  Review of Systems  Constitutional: Positive for chills and fatigue.  HENT:   Positive for mouth sores and trouble swallowing.   Respiratory: Positive for shortness of breath.     Cardiovascular: Positive for leg swelling.  Gastrointestinal: Positive for nausea.  Neurological: Positive for numbness.  Hematological: Bruises/bleeds easily.     PAST MEDICAL/SURGICAL HISTORY:  Past Medical History:  Diagnosis Date  . Arnold-Chiari syndrome ([place])   . Arthritis   . Asthma   . Chronic back pain   . COPD (chronic obstructive pulmonary disease) ([place])   . Depression   . Dyspnea    with exertion   . GERD (gastroesophageal reflux disease)   . Hypertension   . Pneumonia   . Pulmonary embolus ([place])   . Seasonal allergies   . Spinal stenosis of lumbar region   . 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 Wheezing    Past Surgical History:  Procedure Laterality Date  . APPLICATION OF WOUND VAC  04/19/2018   Procedure: APPLICATION OF WOUND VAC;  Surgeon: [name], MD;  Location: [facility];  Service: Vascular;;  . APPLICATION OF WOUND VAC Right 04/21/2018   Procedure: WOUND VAC CHANGE;  Surgeon: [name], MD;  Location: [facility];  Service: Thoracic;  Laterality: Right;  . APPLICATION OF WOUND VAC N/A 04/24/2018   Procedure: WOUND VAC CHANGE;  Surgeon: [name], MD;  Location: [facility];  Service: Thoracic;  Laterality: N/A;  . BACK SURGERY     5 total  . BIOPSY  01/03/2017   Procedure: BIOPSY;  Surgeon: Daneil Dolin, MD;  [facility];  Service: Endoscopy;;  gastric  . CHEST TUBE INSERTION Right 04/19/2018   Procedure: CHEST TUBE INSERTION;  Surgeon: [name], MD;  Location: [facility];  Service: Vascular;  Laterality: Right;  . CHEST TUBE INSERTION Right 04/19/2018   Procedure: CHEST TUBE INSERTION;  Surgeon: [name], MD;  Location: [facility];  Service: Thoracic;  Laterality: Right;  . COLONOSCOPY WITH PROPOFOL N/A 04/04/2017   Procedure: COLONOSCOPY WITH PROPOFOL;  Surgeon: Daneil Dolin, MD;  [facility];  Service: Endoscopy;  Laterality: N/A;  8:45am  . DECORTICATION Right 03/28/2018    Procedure: DECORTICATION;  Surgeon: [name], MD;  Location: [facility] OR;  Service: Thoracic;  Laterality: Right;  . ESOPHAGOGASTRODUODENOSCOPY (EGD) WITH PROPOFOL N/A 01/03/2017   Procedure: ESOPHAGOGASTRODUODENOSCOPY (EGD) WITH PROPOFOL;  Surgeon: Daneil Dolin, MD;  [facility];  Service: Endoscopy;  Laterality: N/A;  . I&D EXTREMITY Right 04/19/2018   Procedure: IRRIGATION AND DEBRIDEMENT RIGHT CHEST WOUND;  Surgeon: [name], MD;  Location: [facility];  Service: Vascular;  Laterality: Right;  . MALONEY DILATION N/A 01/03/2017   Procedure: Venia Minks DILATION;  Surgeon: Daneil Dolin, MD;  [facility];  Service: Endoscopy;  Laterality: N/A;  . MULTIPLE EXTRACTIONS WITH ALVEOLOPLASTY N/A 07/08/2014   Procedure: MULTIPLE EXTRACION WITH ALVEOLOPLASTY with EXCISION LESION RIGHT SIDE OF TONGUE;  Surgeon: Gae Bon, DDS;  Location: [facility];  Service: Oral Surgery;  Laterality: N/A;  . PORTACATH PLACEMENT Right 07/30/2016   Procedure: INSERTION PORT-A-CATH;  Surgeon: Vickie Epley, MD;  [facility];  Service:  Vascular;  Laterality: Right;  . THORACOTOMY  04/19/2018   Procedure: THORACOTOMY MAJOR;  Surgeon: [name], MD;  Location: [facility];  Service: Vascular;;  . VIDEO ASSISTED THORACOSCOPY Right 04/21/2018   Procedure: VIDEO ASSISTED THORACOSCOPY;  Surgeon: [name], MD;  Location: [facility];  Service: Thoracic;  Laterality: Right;  [name] VIDEO ASSISTED THORACOSCOPY Right 04/24/2018   Procedure: VIDEO ASSISTED THORACOSCOPY;  Surgeon: [name], MD;  Location: [facility];  Service: Thoracic;  Laterality: Right;  [name] VIDEO ASSISTED THORACOSCOPY (VATS)/EMPYEMA Right 03/28/2018   Procedure: VIDEO ASSISTED THORACOSCOPY (VATS)/EMPYEMA;  Surgeon: [name], MD;  Location: [facility];  Service: Thoracic;  Laterality: Right;  [name] VIDEO BRONCHOSCOPY WITH ENDOBRONCHIAL ULTRASOUND N/A 07/19/2016   Procedure: VIDEO BRONCHOSCOPY WITH  ENDOBRONCHIAL ULTRASOUND;  Surgeon: [name], MD;  Location: [facility];  Service: Thoracic;  Laterality: N/A;     SOCIAL HISTORY:  Social History   Socioeconomic History  . Marital status: Divorced    Spouse name: Not on file  . Number of children: Not on file  . Years of education: Not on file  . Highest education level: Not on file  Occupational History  . Not on file  Social Needs  . Financial resource strain: Not on file  . Food insecurity:    Worry: Not on file    Inability: Not on file  . Transportation needs:    Medical: Not on file    Non-medical: Not on file  Tobacco Use  . Smoking status: Former Smoker    Packs/day: 0.25    Years: 38.00    Pack years: 9.50    Types: Cigarettes    Last attempt to quit: 02/16/2018    Years since quitting: 0.4  . Smokeless tobacco: Former Systems developer    Types: Chew  . Tobacco comment: [place] [facility]  Substance and Sexual Activity  . Alcohol use: Yes    Alcohol/week: 2.0 standard drinks    Types: 2 Cans of beer per week    Comment: reports drinking 0-2 beers a week  . Drug use: Yes    Frequency: 7.0 times per week    Types: Marijuana    Comment: daily for cancer; pt denies any use at present time 08/23/17  . Sexual activity: Not Currently  Lifestyle  . Physical activity:    Days per week: Not on file    Minutes per session: Not on file  . Stress: Not on file  Relationships  . Social connections:    Talks on phone: Not on file    Gets together: Not on file    Attends religious service: Not on file    Active member of club or organization: Not on file    Attends meetings of clubs or organizations: Not on file    Relationship status: Not on file  . Intimate partner violence:    Fear of current or ex partner: Not on file    Emotionally abused: Not on file    Physically abused: Not on file    Forced sexual activity: Not on file  Other Topics Concern  . Not on file  Social History Narrative  . Not on file     FAMILY HISTORY:  Family History  Problem Relation Age of Onset  . Cancer Mother        breast cancer  . Heart failure Brother   . Colon cancer Neg Hx        not sure, ?grandfather  and/or uncle    CURRENT MEDICATIONS:  Outpatient Encounter Medications as of 07/25/2018  Medication Sig  . acetaminophen (TYLENOL) 325 MG tablet Take 2 tablets (650 mg total) by mouth every 6 (six) hours as needed for mild pain (or Fever >/= 101).  [name] albuterol (PROVENTIL HFA;VENTOLIN HFA) 108 (90 Base) MCG/ACT inhaler Inhale 2 puffs into the lungs every 6 (six) hours as needed for wheezing or shortness of breath.  [name] albuterol (PROVENTIL) (2.5 MG/3ML) 0.083% nebulizer solution Take 3 mLs (2.5 mg total) by nebulization every 6 (six) hours as needed for wheezing or shortness of breath (if unable to experience relief by using combivent inhaler).  . ALPRAZolam (XANAX) 1 MG tablet Take 1 mg by mouth 2 (two) times daily as needed for anxiety or sleep (takes 1 tablet and bedtime for sleep and 1 dose during the day only if needed).   [name] amLODipine (NORVASC) 5 MG tablet Take 5 mg by mouth daily.   . Artificial Tear Solution (SOOTHE XP) SOLN Apply 2 drops to eye daily.  . bisacodyl (DULCOLAX) 5 MG EC tablet Take 1 tablet (5 mg total) by mouth daily as needed for moderate constipation.  [name] CARBOPLATIN IV Inject into the vein.  [name] ELIQUIS 5 MG TABS tablet   . escitalopram (LEXAPRO) 20 MG tablet Take 20 mg by mouth daily.   . folic acid (FOLVITE) 1 MG tablet Take 1 tablet (1 mg total) by mouth daily.  . furosemide (LASIX) 20 MG tablet Take 40 mg by mouth daily.  [name] gabapentin (NEURONTIN) 600 MG tablet Take 600 mg by mouth 2 (two) times daily.   [name] guaiFENesin (MUCINEX) 600 MG 12 hr tablet Take 600 mg by mouth 2 (two) times daily as needed (for congestion.).  [name] Ipratropium-Albuterol (COMBIVENT) 20-100 MCG/ACT AERS respimat Inhale 1 puff into the lungs every 6 (six) hours as needed for wheezing or shortness of breath.  .  lactulose (CHRONULAC) 10 GM/15ML solution Take 15 mLs (10 g total) by mouth 3 (three) times daily as needed for mild constipation or moderate constipation.  [name] levalbuterol (XOPENEX) 0.63 MG/3ML nebulizer solution Take 3 mLs (0.63 mg total) by nebulization every 8 (eight) hours as needed for wheezing or shortness of breath.  . loperamide (IMODIUM) 2 MG capsule Take 1 capsule (2 mg total) by mouth as needed for diarrhea or loose stools.  . methadone (DOLOPHINE) 10 MG tablet Take 20 mg by mouth 5 (five) times daily. Takes 2 tablets 5 times daily  . mirtazapine (REMERON) 15 MG tablet Take 1 tablet by mouth at bedtime.  . Misc. Devices MISC POC at 2 lpm via [place] with conserver / pulse ox on exertion.  . Multiple Vitamin (MULTIVITAMIN WITH MINERALS) TABS tablet Take 1 tablet by mouth daily. Centrum  . ondansetron (ZOFRAN) 8 MG tablet TAKE 1 TABLET BY MOUTH EVERY 8 HOURS AS NEEDED FOR NAUSEA AND VOMITING. (Patient taking differently: Take 8 mg by mouth every 8 (eight) hours as needed. TAKE 1 TABLET BY MOUTH EVERY 8 HOURS AS NEEDED FOR NAUSEA AND VOMITING.)  . OXYGEN Inhale 3 L into the lungs daily.   . pantoprazole (PROTONIX) 40 MG tablet Take 1 tablet (40 mg total) by mouth 2 (two) times daily.  [name] PEMEtrexed Disodium (ALIMTA IV) Inject into the vein as directed.   . potassium chloride SA (K-DUR,KLOR-CON) 20 MEQ tablet Take 1 tablet (20 mEq total) by mouth daily.  . prochlorperazine (COMPAZINE) 10 MG tablet Take 1 tablet (10 mg total) by mouth every  6 (six) hours as needed (Nausea or vomiting).  . STIOLTO RESPIMAT 2.5-2.5 MCG/ACT AERS Inhale 1 puff into the lungs 3 (three) times daily.  . sucralfate (CARAFATE) 1 GM/10ML suspension carafate 1gm/37m & viscous Lidocaine 2% 1:1 mixture.  Swish and swallow 1 tablespoon four times a day.  .Marland KitchentiZANidine (ZANAFLEX) 4 MG tablet Take 4 mg by mouth every 8 (eight) hours as needed for muscle spasms.   . varenicline (CHANTIX) 0.5 MG tablet TAKE (2) TABLETS BY MOUTH TWICE  DAILY.   Facility-Administered Encounter Medications as of 07/25/2018  Medication  . 0.9 %  sodium chloride infusion    ALLERGIES:  Allergies  Allergen Reactions  . Demeclocycline Swelling    SWELLING REACTION UNSPECIFIED   . Tetracyclines & Related Swelling    SWELLING REACTION UNSPECIFIED      PHYSICAL EXAM:  ECOG Performance status: 1  Vitals:   07/25/18 0859  BP: 123/66  Pulse: (!) 118  Resp: 20  Temp: 98 F (36.7 C)  SpO2: 95%   Filed Weights   07/25/18 0859  Weight: 220 lb 7.4 oz (100 kg)    Physical Exam  Constitutional: He is oriented to person, place, and time. He appears well-developed and well-nourished.  Cardiovascular: Normal rate, regular rhythm and normal heart sounds.  Pulmonary/Chest: Effort normal. He has wheezes.  Neurological: He is alert and oriented to person, place, and time.  Skin: Skin is warm and dry.     LABORATORY DATA:  I have reviewed the labs as listed.  CBC    Component Value Date/Time   WBC 8.5 07/25/2018 0836   RBC 2.78 (L) 07/25/2018 0836   HGB 8.3 (L) 07/25/2018 0836   HCT 27.0 (L) 07/25/2018 0836   PLT 340 07/25/2018 0836   MCV 97.1 07/25/2018 0836   MCH 29.9 07/25/2018 0836   MCHC 30.7 07/25/2018 0836   RDW 17.6 (H) 07/25/2018 0836   LYMPHSABS 1.3 07/25/2018 0836   MONOABS 1.3 (H) 07/25/2018 0836   EOSABS 0.0 07/25/2018 0836   BASOSABS 0.0 07/25/2018 0836   CMP Latest Ref Rng & Units 07/25/2018 07/18/2018 07/16/2018  Glucose 70 - 99 mg/dL 130(H) 107(H) 156(H)  BUN 6 - 20 mg/dL 7 9 21(H)  Creatinine 0.61 - 1.24 mg/dL 1.09 0.56(L) 0.65  Sodium 135 - 145 mmol/L 136 136 135  Potassium 3.5 - 5.1 mmol/L 3.2(L) 3.7 3.6  Chloride 98 - 111 mmol/L 99 99 103  CO2 22 - 32 mmol/L 30 30 26   Calcium 8.9 - 10.3 mg/dL 7.8(L) 8.3(L) 7.8(L)  Total Protein 6.5 - 8.1 g/dL 8.2(H) - -  Total Bilirubin 0.3 - 1.2 mg/dL 0.6 - -  Alkaline Phos 38 - 126 U/L 214(H) - -  AST 15 - 41 U/L 44(H) - -  ALT 0 - 44 U/L 39 - -        DIAGNOSTIC IMAGING:  I have independently reviewed CT scan of the chest dated 07/11/2018 which did not show any evidence of pulmonary embolus.     ASSESSMENT & PLAN:   [place].  Stage IV adenocarcinoma of the right lung: - Initially treated with cisplatin and VP-16 for stage III disease followed by Imfinzi from 12/02/2016 through 06/30/2017.  He reportedly had progression during durvalumab treatment. -Tecentriq every 3 weeks from 08/04/2017 through 02/16/2018. 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  Chest tube  was removed last week.  He has a follow-up with Dr. Roxan Hockey tomorrow for possible removal of VAC. - Left supraclavicular adenopathy was noted in May 2019.  Underwent needle biopsy on 05/15/2018 which shows metastatic poorly differentiated adenocarcinoma consistent with his lung primary. - Wound VAC was removed about 2 weeks ago.  His energy levels are low but stable.  We discussed the results of the CT scan of the chest dated 06/19/2018 which showed progressive left supraclavicular, axillary and subpectoral adenopathy.  Mediastinal/hilar adenopathy and upper abdominal adenopathy is relatively stable.  Progressive dense right upper lobe airspace opacification and interstitial thickening.  Persistent loculated pleural fluid collection of the right lung base which now contains gas. - His PDL 1 testing shows TPS score of 50%.  Condition 1 report shows MS-stable, TMB-intermediate, MEK 1 and other mutations.  None of the mutations have any targetable treatments.  Hence I have recommended chemotherapy with carboplatin and pemetrexed. - He received first cycle of carboplatin (AUC 5) and pemetrexed on 07/04/2018.  He was admitted to the hospital with severe mucositis and severe cytopenias.  He required 1 unit of PRBC.  He  also had some hemoptysis when his platelet count went into single digits.  His Eliquis was held during that time and was restarted back.  He is energy levels are slowly coming back to normal. - We will cut back on the dose of his cycle 2 by 33%.  Today his hemoglobin is 8.3.  He will come back in 1 week to check his blood.  If it drops below 8 we will give him transfusion.  He will see Dr. Walden Field in 3 weeks prior to his next treatment.  CT scans will be repeated after 3 cycles to evaluate response.  2.  Lower extremity swelling: This is stable.  He is taking Lasix 40 mg daily.  3.  Pulmonary embolism: He is continuing Eliquis.  No major hemoptysis reported.  4.  Normocytic anemia: We have done work-up which included normal SPEP.  K59 and folic acid were normal.  Ferritin was 81 with percent saturation of 11.  We will give him Feraheme infusion along with chemo.  We have discussed side effects in detail.      Orders placed this encounter:  Orders Placed This Encounter  Procedures  . CBC with Differential/Platelet  . Comprehensive metabolic panel  . Magnesium  . CBC with Differential/Platelet  . Comprehensive metabolic panel      Derek Jack, MD [place] [phone]

## 2018-07-26 ENCOUNTER — Encounter: Payer: Medicare Other | Admitting: Thoracic Surgery (Cardiothoracic Vascular Surgery)

## 2018-07-26 ENCOUNTER — Inpatient Hospital Stay (HOSPITAL_COMMUNITY): Payer: Medicare Other

## 2018-07-26 VITALS — BP 125/57 | HR 116 | Temp 97.6°F | Resp 20

## 2018-07-26 DIAGNOSIS — Z5111 Encounter for antineoplastic chemotherapy: Secondary | ICD-10-CM | POA: Diagnosis not present

## 2018-07-26 DIAGNOSIS — C3491 Malignant neoplasm of unspecified part of right bronchus or lung: Secondary | ICD-10-CM

## 2018-07-26 MED ORDER — PEGFILGRASTIM-CBQV 6 MG/0.6ML ~~LOC~~ SOSY
PREFILLED_SYRINGE | SUBCUTANEOUS | Status: AC
  Filled 2018-07-26: qty 0.6

## 2018-07-26 MED ORDER — PEGFILGRASTIM-CBQV 6 MG/0.6ML ~~LOC~~ SOSY
6.0000 mg | PREFILLED_SYRINGE | Freq: Once | SUBCUTANEOUS | Status: AC
  Administered 2018-07-26: 6 mg via SUBCUTANEOUS

## 2018-07-26 NOTE — Progress Notes (Signed)
Udenyca given today per orders.   Patient tolerated it well without problems. Vitals stable and discharged home from clinic via wheelchair. Follow up as scheduled.

## 2018-07-26 NOTE — Patient Instructions (Signed)
[facility] at [facility] Discharge Instructions     Thank you for choosing [facility] at [facility] to provide your oncology and hematology care.  To afford each patient quality time with our provider, please arrive at least 15 minutes before your scheduled appointment time.   If you have a lab appointment with the Cancer Center please come in thru the  Main Entrance and check in at the main information desk  You need to re-schedule your appointment should you arrive 10 or more minutes late.  We strive to give you quality time with our providers, and arriving late affects you and other patients whose appointments are after yours.  Also, if you no show three or more times for appointments you may be dismissed from the clinic at the providers discretion.     Again, thank you for choosing [facility].  Our hope is that these requests will decrease the amount of time that you wait before being seen by our physicians.       _____________________________________________________________  Should you have questions after your visit to [facility], please contact our office at [phone] between the hours of 8:00 a.m. and 4:30 p.m.  Voicemails left after 4:00 p.m. will not be returned until the following business day.  For prescription refill requests, have your pharmacy contact our office and allow 72 hours.    Cancer Center Support Programs:   > Cancer Support Group  2nd Tuesday of the month 1pm-2pm, Journey Room    

## 2018-08-01 ENCOUNTER — Inpatient Hospital Stay (HOSPITAL_COMMUNITY): Payer: Medicare Other

## 2018-08-01 ENCOUNTER — Encounter (HOSPITAL_COMMUNITY): Payer: Self-pay

## 2018-08-01 VITALS — BP 127/62 | HR 107 | Temp 98.2°F | Resp 20

## 2018-08-01 DIAGNOSIS — C3491 Malignant neoplasm of unspecified part of right bronchus or lung: Secondary | ICD-10-CM

## 2018-08-01 LAB — CBC WITH DIFFERENTIAL/PLATELET
BASOS PCT: 3 %
Basophils Absolute: 0 10*3/uL (ref 0.0–0.1)
EOS ABS: 0 10*3/uL (ref 0.0–0.7)
EOS PCT: 3 %
HCT: 18.9 % — ABNORMAL LOW (ref 39.0–52.0)
HEMOGLOBIN: 5.9 g/dL — AB (ref 13.0–17.0)
Lymphocytes Relative: 80 %
Lymphs Abs: 0.4 10*3/uL — ABNORMAL LOW (ref 0.7–4.0)
MCH: 30.1 pg (ref 26.0–34.0)
MCHC: 31.2 g/dL (ref 30.0–36.0)
MCV: 96.4 fL (ref 78.0–100.0)
MONO ABS: 0 10*3/uL — AB (ref 0.1–1.0)
Monocytes Relative: 3 %
Neutro Abs: 0 10*3/uL — ABNORMAL LOW (ref 1.7–7.7)
Neutrophils Relative %: 11 %
PLATELETS: 152 10*3/uL (ref 150–400)
RBC: 1.96 MIL/uL — AB (ref 4.22–5.81)
RDW: 18.1 % — ABNORMAL HIGH (ref 11.5–15.5)
WBC: 0.4 10*3/uL — AB (ref 4.0–10.5)

## 2018-08-01 LAB — PREPARE RBC (CROSSMATCH)

## 2018-08-01 MED ORDER — SODIUM CHLORIDE 0.9% IV SOLUTION
250.0000 mL | Freq: Once | INTRAVENOUS | Status: AC
  Administered 2018-08-01: 250 mL via INTRAVENOUS

## 2018-08-01 MED ORDER — ACETAMINOPHEN 325 MG PO TABS
650.0000 mg | ORAL_TABLET | Freq: Once | ORAL | Status: AC
  Administered 2018-08-01: 650 mg via ORAL

## 2018-08-01 MED ORDER — DIPHENHYDRAMINE HCL 25 MG PO CAPS
ORAL_CAPSULE | ORAL | Status: AC
  Filled 2018-08-01: qty 1

## 2018-08-01 MED ORDER — SODIUM CHLORIDE 0.9% FLUSH
10.0000 mL | INTRAVENOUS | Status: AC | PRN
  Administered 2018-08-01: 10 mL

## 2018-08-01 MED ORDER — DIPHENHYDRAMINE HCL 25 MG PO CAPS
25.0000 mg | ORAL_CAPSULE | Freq: Once | ORAL | Status: AC
  Administered 2018-08-01: 25 mg via ORAL

## 2018-08-01 MED ORDER — ACETAMINOPHEN 325 MG PO TABS
ORAL_TABLET | ORAL | Status: AC
  Filled 2018-08-01: qty 2

## 2018-08-01 NOTE — Progress Notes (Unsigned)
CRITICAL VALUE ALERT Critical value received:  WBC 0.4 , Hgb 5.9 Date of notification:  08-01-18 Time of notification: 1215 Critical value read back:  Yes.   Nurse who received alert:  C.Tzion Wedel RN MD notified (1st Riona Lahti):  Dr. Delton Coombes

## 2018-08-01 NOTE — Patient Instructions (Signed)
[facility] Discharge Instructions for Patients Receiving Chemotherapy   Beginning January 23rd 2017 lab work for the [facility] will be done in the  Main lab at [facility] on 1st floor. If you have a lab appointment with the [facility] please come in thru the  Main Entrance and check in at the main information desk  You received one unit of blood today. Follow up as scheduled. Call clinic for any questions or concerns.   If you develop nausea and vomiting, or diarrhea that is not controlled by your medication, call the clinic.  The clinic phone number is [phone]. Office hours are Monday-Friday 8:30am-5:00pm.  BELOW ARE SYMPTOMS THAT SHOULD BE REPORTED IMMEDIATELY:  *FEVER GREATER THAN 101.0 F  *CHILLS WITH OR WITHOUT FEVER  NAUSEA AND VOMITING THAT IS NOT CONTROLLED WITH YOUR NAUSEA MEDICATION  *UNUSUAL SHORTNESS OF BREATH  *UNUSUAL BRUISING OR BLEEDING  TENDERNESS IN MOUTH AND THROAT WITH OR WITHOUT PRESENCE OF ULCERS  *URINARY PROBLEMS  *BOWEL PROBLEMS  UNUSUAL RASH Items with * indicate a potential emergency and should be followed up as soon as possible. If you have an emergency after office hours please contact your primary care physician or go to the nearest emergency department.  Please call the clinic during office hours if you have any questions or concerns.   You may also contact the Patient Navigator at [phone] should you have any questions or need assistance in obtaining follow up care.      Resources For Cancer Patients and their Caregivers ? American Cancer Society: Can assist with transportation, wigs, general needs, runs Look Good Feel Better.        (520)685-0189 ? Cancer Care: Provides financial assistance, online support groups, medication/co-pay assistance.  1-800-813-HOPE 978-416-8193) ? [place] Assists [place] cancer patients and their families through emotional , educational and  financial support.  (575)580-8783 ? [place] DSS Where to apply for food stamps, Medicaid and utility assistance. 540 128 2183 ? [facility]: Transportation to medical appointments. 763-328-8064 ? Social Security Administration: May apply for disability if have a Stage IV cancer. 717-150-8269 505-394-7082 ? [employer], Disability and Transit Services: Assists with nutrition, care and transit needs. 820-669-2768

## 2018-08-01 NOTE — Progress Notes (Signed)
Erik [name] tolerated blood transfusion well without complaints or incident. VSS upon discharge. Pt discharged via wheelchair in stable condition

## 2018-08-02 ENCOUNTER — Encounter (HOSPITAL_COMMUNITY): Payer: Self-pay

## 2018-08-02 ENCOUNTER — Other Ambulatory Visit: Payer: Self-pay

## 2018-08-02 ENCOUNTER — Other Ambulatory Visit (HOSPITAL_COMMUNITY): Payer: Self-pay | Admitting: Nurse Practitioner

## 2018-08-02 ENCOUNTER — Inpatient Hospital Stay (HOSPITAL_COMMUNITY)
Admission: EM | Admit: 2018-08-02 | Discharge: 2018-08-05 | DRG: 808 | Disposition: A | Discharge status: Home Health | Payer: Medicare Other | Attending: Internal Medicine | Admitting: Internal Medicine

## 2018-08-02 ENCOUNTER — Inpatient Hospital Stay (HOSPITAL_BASED_OUTPATIENT_CLINIC_OR_DEPARTMENT_OTHER): Payer: Medicare Other | Admitting: Hematology

## 2018-08-02 ENCOUNTER — Ambulatory Visit (HOSPITAL_COMMUNITY)
Admission: RE | Admit: 2018-08-02 | Discharge: 2018-08-02 | Disposition: A | Discharge status: Home / Self Care | Payer: Medicare Other | Source: Ambulatory Visit | Attending: Hematology | Admitting: Hematology

## 2018-08-02 ENCOUNTER — Inpatient Hospital Stay (HOSPITAL_COMMUNITY): Payer: Medicare Other

## 2018-08-02 ENCOUNTER — Encounter (HOSPITAL_COMMUNITY): Payer: Self-pay | Admitting: Hematology

## 2018-08-02 ENCOUNTER — Emergency Department (HOSPITAL_COMMUNITY): Payer: Medicare Other

## 2018-08-02 VITALS — BP 147/76 | HR 109 | Temp 98.1°F | Resp 20

## 2018-08-02 DIAGNOSIS — D61810 Antineoplastic chemotherapy induced pancytopenia: Secondary | ICD-10-CM | POA: Diagnosis present

## 2018-08-02 DIAGNOSIS — R0602 Shortness of breath: Secondary | ICD-10-CM

## 2018-08-02 DIAGNOSIS — D696 Thrombocytopenia, unspecified: Secondary | ICD-10-CM

## 2018-08-02 DIAGNOSIS — J9611 Chronic respiratory failure with hypoxia: Secondary | ICD-10-CM | POA: Diagnosis present

## 2018-08-02 DIAGNOSIS — F418 Other specified anxiety disorders: Secondary | ICD-10-CM | POA: Diagnosis present

## 2018-08-02 DIAGNOSIS — C7951 Secondary malignant neoplasm of bone: Secondary | ICD-10-CM

## 2018-08-02 DIAGNOSIS — Z881 Allergy status to other antibiotic agents status: Secondary | ICD-10-CM | POA: Diagnosis not present

## 2018-08-02 DIAGNOSIS — M1990 Unspecified osteoarthritis, unspecified site: Secondary | ICD-10-CM | POA: Diagnosis present

## 2018-08-02 DIAGNOSIS — Z8249 Family history of ischemic heart disease and other diseases of the circulatory system: Secondary | ICD-10-CM

## 2018-08-02 DIAGNOSIS — C3491 Malignant neoplasm of unspecified part of right bronchus or lung: Secondary | ICD-10-CM | POA: Diagnosis present

## 2018-08-02 DIAGNOSIS — F1290 Cannabis use, unspecified, uncomplicated: Secondary | ICD-10-CM | POA: Diagnosis present

## 2018-08-02 DIAGNOSIS — C3431 Malignant neoplasm of lower lobe, right bronchus or lung: Secondary | ICD-10-CM | POA: Diagnosis not present

## 2018-08-02 DIAGNOSIS — J910 Malignant pleural effusion: Secondary | ICD-10-CM | POA: Diagnosis present

## 2018-08-02 DIAGNOSIS — D649 Anemia, unspecified: Secondary | ICD-10-CM | POA: Diagnosis not present

## 2018-08-02 DIAGNOSIS — T451X5A Adverse effect of antineoplastic and immunosuppressive drugs, initial encounter: Secondary | ICD-10-CM | POA: Diagnosis not present

## 2018-08-02 DIAGNOSIS — Z7189 Other specified counseling: Secondary | ICD-10-CM | POA: Diagnosis not present

## 2018-08-02 DIAGNOSIS — D709 Neutropenia, unspecified: Principal | ICD-10-CM | POA: Diagnosis present

## 2018-08-02 DIAGNOSIS — Z8701 Personal history of pneumonia (recurrent): Secondary | ICD-10-CM

## 2018-08-02 DIAGNOSIS — Z79899 Other long term (current) drug therapy: Secondary | ICD-10-CM

## 2018-08-02 DIAGNOSIS — G8929 Other chronic pain: Secondary | ICD-10-CM | POA: Diagnosis present

## 2018-08-02 DIAGNOSIS — Z86711 Personal history of pulmonary embolism: Secondary | ICD-10-CM | POA: Diagnosis present

## 2018-08-02 DIAGNOSIS — F17211 Nicotine dependence, cigarettes, in remission: Secondary | ICD-10-CM | POA: Diagnosis not present

## 2018-08-02 DIAGNOSIS — Z9981 Dependence on supplemental oxygen: Secondary | ICD-10-CM

## 2018-08-02 DIAGNOSIS — I10 Essential (primary) hypertension: Secondary | ICD-10-CM | POA: Diagnosis present

## 2018-08-02 DIAGNOSIS — Z66 Do not resuscitate: Secondary | ICD-10-CM | POA: Diagnosis present

## 2018-08-02 DIAGNOSIS — J869 Pyothorax without fistula: Secondary | ICD-10-CM | POA: Diagnosis present

## 2018-08-02 DIAGNOSIS — Z23 Encounter for immunization: Secondary | ICD-10-CM

## 2018-08-02 DIAGNOSIS — R5081 Fever presenting with conditions classified elsewhere: Secondary | ICD-10-CM | POA: Diagnosis present

## 2018-08-02 DIAGNOSIS — Z7901 Long term (current) use of anticoagulants: Secondary | ICD-10-CM

## 2018-08-02 DIAGNOSIS — M549 Dorsalgia, unspecified: Secondary | ICD-10-CM | POA: Diagnosis present

## 2018-08-02 DIAGNOSIS — Z9221 Personal history of antineoplastic chemotherapy: Secondary | ICD-10-CM

## 2018-08-02 DIAGNOSIS — J449 Chronic obstructive pulmonary disease, unspecified: Secondary | ICD-10-CM | POA: Diagnosis present

## 2018-08-02 DIAGNOSIS — B370 Candidal stomatitis: Secondary | ICD-10-CM | POA: Diagnosis present

## 2018-08-02 DIAGNOSIS — E876 Hypokalemia: Secondary | ICD-10-CM | POA: Diagnosis present

## 2018-08-02 DIAGNOSIS — G894 Chronic pain syndrome: Secondary | ICD-10-CM | POA: Diagnosis present

## 2018-08-02 DIAGNOSIS — K219 Gastro-esophageal reflux disease without esophagitis: Secondary | ICD-10-CM | POA: Diagnosis present

## 2018-08-02 DIAGNOSIS — Z803 Family history of malignant neoplasm of breast: Secondary | ICD-10-CM

## 2018-08-02 DIAGNOSIS — Z923 Personal history of irradiation: Secondary | ICD-10-CM

## 2018-08-02 DIAGNOSIS — D61818 Other pancytopenia: Secondary | ICD-10-CM | POA: Diagnosis not present

## 2018-08-02 DIAGNOSIS — Q0700 Arnold-Chiari syndrome without spina bifida or hydrocephalus: Secondary | ICD-10-CM

## 2018-08-02 DIAGNOSIS — J302 Other seasonal allergic rhinitis: Secondary | ICD-10-CM | POA: Diagnosis present

## 2018-08-02 DIAGNOSIS — Z515 Encounter for palliative care: Secondary | ICD-10-CM | POA: Diagnosis not present

## 2018-08-02 DIAGNOSIS — B3781 Candidal esophagitis: Secondary | ICD-10-CM

## 2018-08-02 DIAGNOSIS — Z87891 Personal history of nicotine dependence: Secondary | ICD-10-CM

## 2018-08-02 LAB — URINALYSIS, ROUTINE W REFLEX MICROSCOPIC
BACTERIA UA: NONE SEEN
Bilirubin Urine: NEGATIVE
Glucose, UA: NEGATIVE mg/dL
KETONES UR: NEGATIVE mg/dL
LEUKOCYTES UA: NEGATIVE
Nitrite: NEGATIVE
Protein, ur: NEGATIVE mg/dL
Specific Gravity, Urine: 1.004 — ABNORMAL LOW (ref 1.005–1.030)
pH: 7 (ref 5.0–8.0)

## 2018-08-02 LAB — PREPARE RBC (CROSSMATCH)

## 2018-08-02 LAB — CBC WITH DIFFERENTIAL/PLATELET
BASOS ABS: 0 10*3/uL (ref 0.0–0.1)
BASOS PCT: 5 %
EOS PCT: 0 %
Eosinophils Absolute: 0 10*3/uL (ref 0.0–0.7)
HCT: 19.5 % — ABNORMAL LOW (ref 39.0–52.0)
Hemoglobin: 6.1 g/dL — CL (ref 13.0–17.0)
Lymphocytes Relative: 90 %
Lymphs Abs: 0.2 10*3/uL (ref 0.7–4.0)
MCH: 29.2 pg (ref 26.0–34.0)
MCHC: 31.3 g/dL (ref 30.0–36.0)
MCV: 93.3 fL (ref 78.0–100.0)
Monocytes Absolute: 0 10*3/uL (ref 0.1–1.0)
Monocytes Relative: 5 %
Neutro Abs: 0 10*3/uL (ref 1.7–7.7)
Neutrophils Relative %: 0 %
PLATELETS: 81 10*3/uL — AB (ref 150–400)
RBC: 2.09 MIL/uL — AB (ref 4.22–5.81)
RDW: 17.2 % — ABNORMAL HIGH (ref 11.5–15.5)
WBC: 0.2 10*3/uL — AB (ref 4.0–10.5)

## 2018-08-02 LAB — COMPREHENSIVE METABOLIC PANEL
ALT: 33 U/L (ref 0–44)
ANION GAP: 6 (ref 5–15)
AST: 48 U/L — AB (ref 15–41)
Albumin: 2 g/dL — ABNORMAL LOW (ref 3.5–5.0)
Alkaline Phosphatase: 176 U/L — ABNORMAL HIGH (ref 38–126)
BILIRUBIN TOTAL: 1.1 mg/dL (ref 0.3–1.2)
BUN: 8 mg/dL (ref 6–20)
CALCIUM: 7.6 mg/dL — AB (ref 8.9–10.3)
CO2: 33 mmol/L — ABNORMAL HIGH (ref 22–32)
Chloride: 95 mmol/L — ABNORMAL LOW (ref 98–111)
Creatinine, Ser: 0.85 mg/dL (ref 0.61–1.24)
GFR calc Af Amer: >60 mL/min (ref >60)
Glucose, Bld: 140 mg/dL — ABNORMAL HIGH (ref 70–99)
Potassium: 2.7 mmol/L — CL (ref 3.5–5.1)
Sodium: 134 mmol/L — ABNORMAL LOW (ref 135–145)
TOTAL PROTEIN: 7.9 g/dL (ref 6.5–8.1)

## 2018-08-02 LAB — TROPONIN I

## 2018-08-02 LAB — PROTIME-INR
INR: 1.78
PROTHROMBIN TIME: 20.6 s — AB (ref 11.4–15.2)

## 2018-08-02 LAB — MAGNESIUM: Magnesium: 1.7 mg/dL (ref 1.7–2.4)

## 2018-08-02 LAB — LACTIC ACID, PLASMA
LACTIC ACID, VENOUS: 1.1 mmol/L (ref 0.5–1.9)
LACTIC ACID, VENOUS: 1.5 mmol/L (ref 0.5–1.9)

## 2018-08-02 LAB — BRAIN NATRIURETIC PEPTIDE: B Natriuretic Peptide: 84 pg/mL (ref 0.0–100.0)

## 2018-08-02 MED ORDER — SODIUM CHLORIDE 0.9 % IV SOLN
10.0000 mL/h | Freq: Once | INTRAVENOUS | Status: AC
  Administered 2018-08-03: 10 mL/h via INTRAVENOUS

## 2018-08-02 MED ORDER — ACETAMINOPHEN 325 MG PO TABS
650.0000 mg | ORAL_TABLET | Freq: Once | ORAL | Status: AC
  Administered 2018-08-02: 650 mg via ORAL
  Filled 2018-08-02: qty 2

## 2018-08-02 MED ORDER — SODIUM CHLORIDE 0.9% IV SOLUTION
250.0000 mL | Freq: Once | INTRAVENOUS | Status: AC
  Administered 2018-08-02: 250 mL via INTRAVENOUS

## 2018-08-02 MED ORDER — IPRATROPIUM BROMIDE 0.02 % IN SOLN: 0.5000 mg | Freq: Four times a day (QID) | RESPIRATORY_TRACT | Status: DC | PRN

## 2018-08-02 MED ORDER — HEPARIN SOD (PORK) LOCK FLUSH 100 UNIT/ML IV SOLN
500.0000 [IU] | Freq: Every day | INTRAVENOUS | Status: AC | PRN
  Administered 2018-08-02: 500 [IU]
  Filled 2018-08-02: qty 5

## 2018-08-02 MED ORDER — DIPHENHYDRAMINE HCL 25 MG PO CAPS
25.0000 mg | ORAL_CAPSULE | Freq: Once | ORAL | Status: AC
  Administered 2018-08-02: 25 mg via ORAL
  Filled 2018-08-02: qty 1

## 2018-08-02 MED ORDER — AMOXICILLIN-POT CLAVULANATE 875-125 MG PO TABS: 1.0000 | ORAL_TABLET | Freq: Two times a day (BID) | ORAL | 0 refills | Status: AC

## 2018-08-02 MED ORDER — MAGNESIUM OXIDE 400 (241.3 MG) MG PO TABS: 400.0000 mg | ORAL_TABLET | Freq: Once | ORAL | Status: DC

## 2018-08-02 MED ORDER — VANCOMYCIN HCL IN DEXTROSE 1-5 GM/200ML-% IV SOLN
1000.0000 mg | Freq: Once | INTRAVENOUS | Status: AC
  Administered 2018-08-02: 1000 mg via INTRAVENOUS
  Filled 2018-08-02: qty 200

## 2018-08-02 MED ORDER — FLUCONAZOLE 200 MG PO TABS: 200.0000 mg | ORAL_TABLET | Freq: Every day | ORAL | 0 refills | Status: AC

## 2018-08-02 MED ORDER — SODIUM CHLORIDE 0.9% FLUSH
10.0000 mL | INTRAVENOUS | Status: AC | PRN
  Administered 2018-08-02: 10 mL

## 2018-08-02 MED ORDER — SODIUM CHLORIDE 0.9 % IV SOLN
2.0000 g | Freq: Three times a day (TID) | INTRAVENOUS | Status: DC
  Administered 2018-08-02 – 2018-08-04 (×6): 2 g via INTRAVENOUS
  Filled 2018-08-02 (×16): qty 2

## 2018-08-02 MED ORDER — SODIUM CHLORIDE 0.9 % IV SOLN
INTRAVENOUS | Status: DC
  Administered 2018-08-02: 14:00:00 via INTRAVENOUS

## 2018-08-02 MED ORDER — LEVALBUTEROL HCL 0.63 MG/3ML IN NEBU
0.6300 mg | INHALATION_SOLUTION | Freq: Four times a day (QID) | RESPIRATORY_TRACT | Status: DC | PRN
  Administered 2018-08-03 – 2018-08-04 (×3): 0.63 mg via RESPIRATORY_TRACT
  Filled 2018-08-02 (×3): qty 3

## 2018-08-02 MED ORDER — SODIUM CHLORIDE 0.9 % IV SOLN
1000.0000 mL | INTRAVENOUS | Status: DC
  Administered 2018-08-02: 1000 mL via INTRAVENOUS

## 2018-08-02 MED ORDER — ONDANSETRON HCL 4 MG/2ML IJ SOLN: 4.0000 mg | Freq: Four times a day (QID) | INTRAMUSCULAR | Status: DC | PRN

## 2018-08-02 MED ORDER — SODIUM CHLORIDE 0.9 % IV SOLN
2.0000 g | Freq: Once | INTRAVENOUS | Status: AC
  Administered 2018-08-02: 2 g via INTRAVENOUS
  Filled 2018-08-02: qty 2

## 2018-08-02 MED ORDER — POTASSIUM CHLORIDE 10 MEQ/100ML IV SOLN
10.0000 meq | Freq: Once | INTRAVENOUS | Status: AC
  Administered 2018-08-02: 10 meq via INTRAVENOUS
  Filled 2018-08-02: qty 100

## 2018-08-02 MED ORDER — POTASSIUM CHLORIDE CRYS ER 20 MEQ PO TBCR
40.0000 meq | EXTENDED_RELEASE_TABLET | Freq: Once | ORAL | Status: AC
  Administered 2018-08-02: 40 meq via ORAL
  Filled 2018-08-02: qty 2

## 2018-08-02 MED ORDER — VANCOMYCIN HCL 10 G IV SOLR
1500.0000 mg | Freq: Once | INTRAVENOUS | Status: AC
  Administered 2018-08-02: 1500 mg via INTRAVENOUS
  Filled 2018-08-02 (×2): qty 1500

## 2018-08-02 MED ORDER — ONDANSETRON HCL 4 MG PO TABS: 4.0000 mg | ORAL_TABLET | Freq: Four times a day (QID) | ORAL | Status: DC | PRN

## 2018-08-02 MED ORDER — FLUCONAZOLE 100 MG PO TABS
200.0000 mg | ORAL_TABLET | Freq: Once | ORAL | Status: AC
  Administered 2018-08-02: 200 mg via ORAL
  Filled 2018-08-02: qty 1

## 2018-08-02 MED ORDER — ACETAMINOPHEN 325 MG PO TABS: 650.0000 mg | ORAL_TABLET | Freq: Four times a day (QID) | ORAL | Status: DC | PRN

## 2018-08-02 MED ORDER — MAGNESIUM SULFATE 2 GM/50ML IV SOLN
2.0000 g | Freq: Once | INTRAVENOUS | Status: AC
  Administered 2018-08-03: 2 g via INTRAVENOUS
  Filled 2018-08-02: qty 50

## 2018-08-02 MED ORDER — POTASSIUM CHLORIDE IN NACL 20-0.9 MEQ/L-% IV SOLN: INTRAVENOUS | Status: DC

## 2018-08-02 MED ORDER — ACETAMINOPHEN 650 MG RE SUPP: 650.0000 mg | Freq: Four times a day (QID) | RECTAL | Status: DC | PRN

## 2018-08-02 NOTE — Patient Instructions (Signed)
[facility] at [facility] Discharge Instructions     Thank you for choosing [facility] at [facility] to provide your oncology and hematology care.  To afford each patient quality time with our provider, please arrive at least 15 minutes before your scheduled appointment time.   If you have a lab appointment with the Cancer Center please come in thru the  Main Entrance and check in at the main information desk  You need to re-schedule your appointment should you arrive 10 or more minutes late.  We strive to give you quality time with our providers, and arriving late affects you and other patients whose appointments are after yours.  Also, if you no show three or more times for appointments you may be dismissed from the clinic at the providers discretion.     Again, thank you for choosing [facility].  Our hope is that these requests will decrease the amount of time that you wait before being seen by our physicians.       _____________________________________________________________  Should you have questions after your visit to [facility], please contact our office at [phone] between the hours of 8:00 a.m. and 4:30 p.m.  Voicemails left after 4:00 p.m. will not be returned until the following business day.  For prescription refill requests, have your pharmacy contact our office and allow 72 hours.    Cancer Center Support Programs:   > Cancer Support Group  2nd Tuesday of the month 1pm-2pm, Journey Room    

## 2018-08-02 NOTE — Patient Instructions (Addendum)
[place] [facility] at [facility] Discharge Instructions  Received 1 unit of blood today as well as Vancomycin and Fortaz. Follow-up as scheduled. Call clinic for any questions or concerns   Thank you for choosing [facility] at [facility] to provide your oncology and hematology care.  To afford each patient quality time with our provider, please arrive at least 15 minutes before your scheduled appointment time.   If you have a lab appointment with the [facility] please come in thru the  Main Entrance and check in at the main information desk  You need to re-schedule your appointment should you arrive 10 or more minutes late.  We strive to give you quality time with our providers, and arriving late affects you and other patients whose appointments are after yours.  Also, if you no show three or more times for appointments you may be dismissed from the clinic at the providers discretion.     Again, thank you for choosing [facility].  Our hope is that these requests will decrease the amount of time that you wait before being seen by our physicians.       _____________________________________________________________  Should you have questions after your visit to [facility], please contact our office at [phone] between the hours of 8:00 a.m. and 4:30 p.m.  Voicemails left after 4:00 p.m. will not be returned until the following business day.  For prescription refill requests, have your pharmacy contact our office and allow 72 hours.    Cancer Center Support Programs:   > Cancer Support Group  2nd Tuesday of the month 1pm-2pm, Journey Room

## 2018-08-02 NOTE — Assessment & Plan Note (Signed)
1.  Stage IV adenocarcinoma of the right lung: - Initially treated with cisplatin and VP-16 for stage III disease followed by [facility] from 12/02/2016 through 06/30/2017.  He reportedly had progression during durvalumab treatment. -Tecentriq every 3 weeks from 08/04/2017 through 02/16/2018. 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  Chest tube was removed last week.  He has a follow-up with Dr. Roxan Hockey tomorrow for possible removal of VAC. - Left supraclavicular adenopathy was noted in May 2019.  Underwent needle biopsy on 05/15/2018 which shows metastatic poorly differentiated adenocarcinoma consistent with his lung primary. - Wound VAC was removed about 2 weeks ago.  His energy levels are low but stable.  We discussed the results of the CT scan of the chest dated 06/19/2018 which showed progressive left supraclavicular, axillary and subpectoral adenopathy.  Mediastinal/hilar adenopathy and upper abdominal adenopathy is relatively stable.  Progressive dense right upper lobe airspace opacification and interstitial thickening.  Persistent loculated pleural fluid collection of the right lung base which now contains gas. - His PDL 1 testing shows TPS score of 50%.  Condition 1 report shows MS-stable, TMB-intermediate, MEK 1 and other mutations.  None of the mutations have any targetable treatments.  Hence I have recommended chemotherapy with carboplatin and pemetrexed. - He received first cycle of carboplatin (AUC 5) and pemetrexed on 07/04/2018.  He was admitted to the hospital with severe mucositis and severe cytopenias.  He required 1 unit of PRBC.  He also had some hemoptysis when his platelet count went into single digits.  His Eliquis was held during that time and was restarted back.  He is energy levels are slowly coming back to normal. - He received cycle 2 at 33%  dose reduction on 07/25/2018. - We have reviewed his blood counts.  His hemoglobin dropped to 5.4.  He received 1 unit of PRBC yesterday.  He will receive 1 unit of PRBCs today.  He will come back in 2 weeks for follow-up. -He was noted to have a temperature of 100.4 prior to his blood transfusion.  We have sent blood cultures.  I have done a chest x-ray in the office.  I have reviewed the chest x-ray which did not show any changes from his prior x-ray 3 weeks ago. -He did report burning sensation in his throat.  Soft palate has thrush.  We gave him some Diflucan.  He will continue Diflucan for 5 days. -For his neutropenic fever, we have given vancomycin and Fortaz.  He was reluctant to be admitted to the hospital.  We have given Augmentin 875 mg twice daily to be taken at home.  He was told to go to the ER if he develops any temperature more than 100.5. -We will follow him tomorrow with repeat blood counts and possible IV antibiotics.  2.  Lower extremity swelling: He will continue Lasix daily.  This is stable.  3.  Pulmonary embolism: He is continuing Eliquis.  No major hemoptysis reported.  4.  Normocytic anemia: We have done work-up which included normal SPEP.  H29 and folic acid were normal.  Ferritin was 81 with percent saturation of 11.  I have recommended Feraheme infusion.  But he ended up having at least 4 transfusions in the meanwhile.  We will plan to reevaluate his ferritin and iron panel.

## 2018-08-02 NOTE — ED Triage Notes (Signed)
Pt is from home, in by [facility] for fever.  Pt is a Cancer pt, received a blood transfusion today at the [facility].   Pt also having some sob worse than usual. Pt states they drew blood at the cancer center today due to fever (including blood cultures)

## 2018-08-02 NOTE — ED Notes (Signed)
Date and time results received: 08/02/18 2126 (use smartphrase ".now" to insert current time)  Test: WBC Critical Value: 0.2  Name of Provider Notified: Dr Thurnell Garbe  Orders Received? Or Actions Taken?:

## 2018-08-02 NOTE — ED Provider Notes (Signed)
[facility] EMERGENCY DEPARTMENT Provider Note   CSN: [id number] Arrival date & time: 08/02/18  1938     History   Chief Complaint Chief Complaint  Patient presents with  . Fever    Cancer Pt-Chemo    HPI JERMAINE NEUHARTH is a 57 y.o. male.  HPI  Pt was seen at 2020. Per pt, c/o gradual onset and persistence of multiple intermittent episodes of "fever" over the course of the day today. Pt's last temp was "100.5" at home PTA, and pt took APAP. Pt c/o increasing SOB "worse than usual" over the past 2 to 3 weeks. Pt was evaluated by his Onc MD today and received 1 unit PRBC transfusion for low H/H. Denies CP/palpitations, no cough, no abd pain, no N/V/D, no back pain, no focal motor weakness.    Past Medical History:  Diagnosis Date  . 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 Arthritis   . Asthma   . Chronic back pain   . COPD (chronic obstructive pulmonary disease) ([place])   . Depression   . Dyspnea    with exertion   . GERD (gastroesophageal reflux disease)   . Hypertension   . Pneumonia   . Pulmonary embolus ([place])   . Seasonal allergies   . Spinal stenosis of lumbar region   . 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 Wheezing     Patient Active Problem List   Diagnosis Date Noted  . DNR (do not resuscitate) discussion   . Encounter for hospice care discussion   . 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 Hemoptysis 07/15/2018  . Palliative care by specialist   . Malnutrition of moderate degree 03/30/2018  . History of pulmonary embolism 03/23/2018  . Bronchiectasis with acute exacerbation ([place])   . Acute on chronic respiratory failure with hypoxia ([place])   . Essential hypertension 03/07/2018  . COPD (chronic obstructive pulmonary disease) ([place]) 03/06/2018  . Taking multiple medications for chronic disease 03/08/2017  . Radiation-induced esophagitis 02/02/2017  . Loss of weight 12/10/2016  . Goals of care, counseling/discussion  12/10/2016  . Esophageal dysphagia 12/10/2016  . Abdominal pain, epigastric 12/10/2016  . 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 GERD 01/13/2009  . ACTINIC KERATOSIS 01/13/2009  . Depression with anxiety 12/27/2008  . ASTHMA 12/27/2008  . OSTEOARTHRITIS 12/27/2008  . LOW BACK PAIN 12/27/2008    Past Surgical History:  Procedure Laterality Date  . APPLICATION OF WOUND VAC  04/19/2018   Procedure: APPLICATION OF WOUND VAC;  Surgeon: [name], MD;  Location: [place];  Service: Vascular;;  . APPLICATION OF WOUND VAC Right 04/21/2018   Procedure: WOUND VAC CHANGE;  Surgeon: [name], MD;  Location: [place];  Service: Thoracic;  Laterality: Right;  . APPLICATION OF WOUND VAC N/A 04/24/2018   Procedure: WOUND VAC CHANGE;  Surgeon: [name], MD;  Location: [place];  Service: Thoracic;  Laterality: N/A;  . BACK SURGERY     5 total  . BIOPSY  01/03/2017   Procedure: BIOPSY;  Surgeon: Daneil Dolin, MD;  [facility];  Service: Endoscopy;;  gastric  . CHEST TUBE INSERTION Right 04/19/2018   Procedure: CHEST TUBE INSERTION;  Surgeon: [name], MD;  Location: [place];  Service: Vascular;  Laterality: Right;  . CHEST TUBE INSERTION Right 04/19/2018   Procedure: CHEST TUBE INSERTION;  Surgeon: [name], MD;  Location: [place];  Service: Thoracic;  Laterality: Right;  .  COLONOSCOPY WITH PROPOFOL N/A 04/04/2017   Procedure: COLONOSCOPY WITH PROPOFOL;  Surgeon: Daneil Dolin, MD;  [facility];  Service: Endoscopy;  Laterality: N/A;  8:45am  . DECORTICATION Right 03/28/2018   Procedure: DECORTICATION;  Surgeon: [name], MD;  Location: [facility] OR;  Service: Thoracic;  Laterality: Right;  . ESOPHAGOGASTRODUODENOSCOPY (EGD) WITH PROPOFOL N/A 01/03/2017   Procedure: ESOPHAGOGASTRODUODENOSCOPY (EGD) WITH PROPOFOL;  Surgeon: Daneil Dolin, MD;  [facility];  Service: Endoscopy;  Laterality: N/A;  . I&D  EXTREMITY Right 04/19/2018   Procedure: IRRIGATION AND DEBRIDEMENT RIGHT CHEST WOUND;  Surgeon: [name], MD;  Location: [place];  Service: Vascular;  Laterality: Right;  . MALONEY DILATION N/A 01/03/2017   Procedure: Venia Minks DILATION;  Surgeon: Daneil Dolin, MD;  [facility];  Service: Endoscopy;  Laterality: N/A;  . MULTIPLE EXTRACTIONS WITH ALVEOLOPLASTY N/A 07/08/2014   Procedure: MULTIPLE EXTRACION WITH ALVEOLOPLASTY with EXCISION LESION RIGHT SIDE OF TONGUE;  Surgeon: Gae Bon, DDS;  Location: [place];  Service: Oral Surgery;  Laterality: N/A;  . PORTACATH PLACEMENT Right 07/30/2016   Procedure: INSERTION PORT-A-CATH;  Surgeon: Vickie Epley, MD;  Location: [facility];  Service: Vascular;  Laterality: Right;  . THORACOTOMY  04/19/2018   Procedure: THORACOTOMY MAJOR;  Surgeon: [name], MD;  Location: [place];  Service: Vascular;;  . VIDEO ASSISTED THORACOSCOPY Right 04/21/2018   Procedure: VIDEO ASSISTED THORACOSCOPY;  Surgeon: [name], MD;  Location: [place];  Service: Thoracic;  Laterality: Right;  [name] VIDEO ASSISTED THORACOSCOPY Right 04/24/2018   Procedure: VIDEO ASSISTED THORACOSCOPY;  Surgeon: [name], MD;  Location: [place];  Service: Thoracic;  Laterality: Right;  [name] VIDEO ASSISTED THORACOSCOPY (VATS)/EMPYEMA Right 03/28/2018   Procedure: VIDEO ASSISTED THORACOSCOPY (VATS)/EMPYEMA;  Surgeon: [name], MD;  Location: [place];  Service: Thoracic;  Laterality: Right;  [name] VIDEO BRONCHOSCOPY WITH ENDOBRONCHIAL ULTRASOUND N/A 07/19/2016   Procedure: VIDEO BRONCHOSCOPY WITH ENDOBRONCHIAL ULTRASOUND;  Surgeon: [name], MD;  Location: [place];  Service: Thoracic;  Laterality: N/A;        Home Medications    Prior to Admission medications   Medication Sig Start Date End Date Taking? Authorizing Provider  acetaminophen (TYLENOL) 325 MG tablet Take 2 tablets (650 mg total) by mouth every 6 (six) hours as needed for  mild pain (or Fever >/= 101). 05/01/18   Barrett, Erin R, PA-C  albuterol (PROVENTIL HFA;VENTOLIN HFA) 108 (90 Base) MCG/ACT inhaler Inhale 2 puffs into the lungs every 6 (six) hours as needed for wheezing or shortness of breath. 06/28/18   Lockamy, Randi L, NP-C  albuterol (PROVENTIL) (2.5 MG/3ML) 0.083% nebulizer solution Take 3 mLs (2.5 mg total) by nebulization every 6 (six) hours as needed for wheezing or shortness of breath (if unable to experience relief by using combivent inhaler). 03/08/18   Barton Dubois, MD  ALPRAZolam Duanne Moron) 1 MG tablet Take 1 mg by mouth 2 (two) times daily as needed for anxiety or sleep (takes 1 tablet and bedtime for sleep and 1 dose during the day only if needed).  06/26/16   [provider]  amLODipine (NORVASC) 5 MG tablet Take 5 mg by mouth daily.  05/17/18   [provider]  amoxicillin-clavulanate (AUGMENTIN) 875-125 MG tablet Take 1 tablet by mouth 2 (two) times daily for 7 days. 08/02/18 08/09/18  Glennie Isle, NP-C  Artificial Tear Solution (SOOTHE XP) SOLN Apply 2 drops to eye daily.  [provider]  bisacodyl (DULCOLAX) 5 MG EC tablet Take 1 tablet (5 mg total) by mouth daily as needed for moderate constipation. 07/18/18   Manuella Ghazi, Pratik D, DO  CARBOPLATIN IV Inject into the vein.    [provider]  ELIQUIS 5 MG TABS tablet  07/19/18   [provider]  escitalopram (LEXAPRO) 20 MG tablet Take 20 mg by mouth daily.  05/10/18   [provider]  fluconazole (DIFLUCAN) 200 MG tablet Take 1 tablet (200 mg total) by mouth daily. 08/02/18   Lockamy, Randi L, NP-C  folic acid (FOLVITE) 1 MG tablet Take 1 tablet (1 mg total) by mouth daily. 06/27/18   Lockamy, Randi L, NP-C  furosemide (LASIX) 20 MG tablet Take 40 mg by mouth daily.    [provider]  gabapentin (NEURONTIN) 600 MG tablet Take 600 mg by mouth 2 (two) times daily.  08/19/17   [provider]  guaiFENesin (MUCINEX) 600 MG 12 hr tablet Take  600 mg by mouth 2 (two) times daily as needed (for congestion.).    [provider]  Ipratropium-Albuterol (COMBIVENT) 20-100 MCG/ACT AERS respimat Inhale 1 puff into the lungs every 6 (six) hours as needed for wheezing or shortness of breath. 03/08/18   Barton Dubois, MD  lactulose (CHRONULAC) 10 GM/15ML solution Take 15 mLs (10 g total) by mouth 3 (three) times daily as needed for mild constipation or moderate constipation. 01/23/18   Jacquelin Hawking, NP  levalbuterol Penne Lash) 0.63 MG/3ML nebulizer solution Take 3 mLs (0.63 mg total) by nebulization every 8 (eight) hours as needed for wheezing or shortness of breath. 05/01/18   Barrett, Erin R, PA-C  loperamide (IMODIUM) 2 MG capsule Take 1 capsule (2 mg total) by mouth as needed for diarrhea or loose stools. 04/08/18   Patrecia Pour, MD  methadone (DOLOPHINE) 10 MG tablet Take 20 mg by mouth 5 (five) times daily. Takes 2 tablets 5 times daily 06/26/16   [provider]  mirtazapine (REMERON) 15 MG tablet Take 1 tablet by mouth at bedtime. 08/17/17   [provider]  Misc. Devices MISC POC at 2 lpm via [place] with conserver / pulse ox on exertion. 05/03/18   Higgs, Mathis Dad, MD  Multiple Vitamin (MULTIVITAMIN WITH MINERALS) TABS tablet Take 1 tablet by mouth daily. Centrum    [provider]  ondansetron (ZOFRAN) 8 MG tablet TAKE 1 TABLET BY MOUTH EVERY 8 HOURS AS NEEDED FOR NAUSEA AND VOMITING. Patient taking differently: Take 8 mg by mouth every 8 (eight) hours as needed. TAKE 1 TABLET BY MOUTH EVERY 8 HOURS AS NEEDED FOR NAUSEA AND VOMITING. 03/02/18   Holley Bouche, NP  OXYGEN Inhale 3 L into the lungs daily.     [provider]  pantoprazole (PROTONIX) 40 MG tablet Take 1 tablet (40 mg total) by mouth 2 (two) times daily. 06/14/18   Lockamy, Randi L, NP-C  PEMEtrexed Disodium (ALIMTA IV) Inject into the vein as directed.     [provider]  potassium chloride SA (K-DUR,KLOR-CON) 20 MEQ tablet Take 1  tablet (20 mEq total) by mouth daily. 03/14/18   Derek Jack, MD  prochlorperazine (COMPAZINE) 10 MG tablet Take 1 tablet (10 mg total) by mouth every 6 (six) hours as needed (Nausea or vomiting). 06/28/18   Derek Jack, MD  STIOLTO RESPIMAT 2.5-2.5 MCG/ACT AERS Inhale 1 puff into the lungs 3 (three) times daily. 07/04/18   [provider]  sucralfate (CARAFATE) 1 GM/10ML suspension  carafate 1gm/27ml & viscous Lidocaine 2% 1:1 mixture.  Swish and swallow 1 tablespoon four times a day. 07/18/18   Derek Jack, MD  tiZANidine (ZANAFLEX) 4 MG tablet Take 4 mg by mouth every 8 (eight) hours as needed for muscle spasms.  06/03/16   [provider]  varenicline (CHANTIX) 0.5 MG tablet TAKE (2) TABLETS BY MOUTH TWICE DAILY. 04/11/18   Holley Bouche, NP    Family History Family History  Problem Relation Age of Onset  . Cancer Mother        breast cancer  . Heart failure Brother   . Colon cancer Neg Hx        not sure, ?grandfather and/or uncle    Social History Social History   Tobacco Use  . Smoking status: Former Smoker    Packs/day: 0.25    Years: 38.00    Pack years: 9.50    Types: Cigarettes    Last attempt to quit: 02/16/2018    Years since quitting: 0.4  . Smokeless tobacco: Former Systems developer    Types: Chew  . Tobacco comment: [place] [facility]  Substance Use Topics  . Alcohol use: Yes    Alcohol/week: 2.0 standard drinks    Types: 2 Cans of beer per week    Comment: reports drinking 0-2 beers a week  . Drug use: Yes    Frequency: 7.0 times per week    Types: Marijuana    Comment: daily for cancer; pt denies any use at present time 08/23/17     Allergies   Demeclocycline and Tetracyclines & related   Review of Systems Review of Systems ROS: Statement: All systems negative except as marked or noted in the HPI; Constitutional: +fever and chills. ; ; Eyes: Negative for eye pain, redness and discharge. ; ; ENMT: Negative for ear  pain, hoarseness, nasal congestion, sinus pressure and sore throat. ; ; Cardiovascular: Negative for chest pain, palpitations, diaphoresis, and peripheral edema. ; ; Respiratory: +SOB. Negative for cough, wheezing and stridor. ; ; Gastrointestinal: Negative for nausea, vomiting, diarrhea, abdominal pain, blood in stool, hematemesis, jaundice and rectal bleeding.  ; ; Genitourinary: Negative for dysuria, flank pain and hematuria. ; ; Musculoskeletal: Negative for back pain and neck pain. Negative for swelling and trauma.; ; Skin: Negative for pruritus, rash, abrasions, blisters, bruising and skin lesion.; ; Neuro: Negative for headache, lightheadedness and neck stiffness. Negative for weakness, altered level of consciousness, altered mental status, extremity weakness, paresthesias, involuntary movement, seizure and syncope.       Physical Exam Updated Vital Signs BP (!) 142/58   Pulse (!) 119   Temp 99.9 F (37.7 C) (Oral)   Resp 17   Ht 6' (1.829 m)   Wt 98.9 kg   SpO2 98%   BMI 29.57 kg/m    Patient Vitals for the past 24 hrs:  BP Temp Temp src Pulse Resp SpO2 Height Weight  08/02/18 2200 118/72 - - (!) 108 17 100 % - -  08/02/18 2130 112/65 - - (!) 110 13 100 % - -  08/02/18 2100 (!) 142/58 - - (!) 119 17 98 % - -  08/02/18 2000 (!) 102/48 - - (!) 114 16 96 % - -  08/02/18 1949 - - - - - 99 % - -  08/02/18 1947 (!) 110/57 99.9 F (37.7 C) Oral (!) 116 19 92 % - -  08/02/18 1944 - - - - - - 6' (1.829 m) 98.9 kg  Physical Exam 2025: Physical examination:  Nursing notes reviewed; Vital signs and O2 SAT reviewed;  Constitutional: Well developed, Well nourished, In no acute distress; Head:  Normocephalic, atraumatic; Eyes: EOMI, PERRL, No scleral icterus; ENMT: Mouth and pharynx normal, Mucous membranes dry; Neck: Supple, Full range of motion, No lymphadenopathy; Cardiovascular: Tachycardic rate and rhythm, No gallop; Respiratory: Breath sounds coarse & equal bilaterally, No wheezes.   Speaking full sentences with ease, Normal respiratory effort/excursion; Chest: Nontender, Movement normal; Abdomen: Soft, Nontender, Nondistended, Normal bowel sounds; Genitourinary: No CVA tenderness; Extremities: Peripheral pulses normal, No tenderness, +1 pedal edema bilat. No calf asymmetry.; Neuro: AA&Ox3, Major CN grossly intact.  Speech clear. No gross focal motor or sensory deficits in extremities.; Skin: Color normal, Warm, Dry.    ED Treatments / Results  Labs (all labs ordered are listed, but only abnormal results are displayed)   EKG None  Radiology   Procedures Procedures (including critical care time)  Medications Ordered in ED Medications  0.9 %  sodium chloride infusion (1,000 mLs Intravenous New Bag/Given 08/02/18 2053)  ceFEPIme (MAXIPIME) 2 g in sodium chloride 0.9 % 100 mL IVPB (has no administration in time range)  vancomycin (VANCOCIN) 1,500 mg in sodium chloride 0.9 % 500 mL IVPB (has no administration in time range)  0.9 %  sodium chloride infusion (has no administration in time range)  potassium chloride SA (K-DUR,KLOR-CON) CR tablet 40 mEq (40 mEq Oral Given 08/02/18 2203)     Initial Impression / Assessment and Plan / ED Course  I have reviewed the triage vital signs and the nursing notes.  Pertinent labs & imaging results that were available during my care of the patient were reviewed by me and considered in my medical decision making (see chart for details).  MDM Reviewed: previous chart, nursing note and vitals Reviewed previous: labs and ECG Interpretation: labs, ECG and x-ray Total time providing critical care: 30-74 minutes. This excludes time spent performing separately reportable procedures and services. Consults: admitting MD   CRITICAL CARE Performed by: Francine Graven Total critical care time: 35 minutes Critical care time was exclusive of separately billable procedures and treating other patients. Critical care was necessary to  treat or prevent imminent or life-threatening deterioration. Critical care was time spent personally by me on the following activities: development of treatment plan with patient and/or surrogate as well as nursing, discussions with consultants, evaluation of patient's response to treatment, examination of patient, obtaining history from patient or surrogate, ordering and performing treatments and interventions, ordering and review of laboratory studies, ordering and review of radiographic studies, pulse oximetry and re-evaluation of patient's condition.   ED ECG REPORT   Date: 08/02/2018  Rate: 113  Rhythm: sinus tachycardia  QRS Axis: normal  Intervals: QT prolonged  ST/T Wave abnormalities: normal, artifact  Conduction Disutrbances:none  Narrative Interpretation:   Old EKG Reviewed: changes noted; QT has lengthened compared to previous EKG dated 07/15/2018. I have personally reviewed the EKG tracing and agree with the computerized printout as noted.   Results for orders placed or performed during the hospital encounter of 08/02/18  Blood Culture (routine x 2)  Result Value Ref Range   Specimen Description RIGHT ANTECUBITAL    Special Requests      BOTTLES DRAWN AEROBIC AND ANAEROBIC Blood Culture adequate volume Performed at [facility], [address], [place], [place] [address]    Culture PENDING    Report Status PENDING   Blood Culture (routine x 2)  Result Value Ref Range  Specimen Description BLOOD RIGHT FOREARM    Special Requests      Blood Culture results may not be optimal due to an excessive volume of blood received in culture bottles Performed at [facility], [address] [place], [place] [address]    Culture PENDING    Report Status PENDING   Comprehensive metabolic panel  Result Value Ref Range   Sodium 134 (L) 135 - 145 mmol/L   Potassium 2.7 (LL) 3.5 - 5.1 mmol/L   Chloride 95 (L) 98 - 111 mmol/L   CO2 33 (H) 22 - 32 mmol/L   Glucose, Bld 140 (H) 70 -  99 mg/dL   BUN 8 6 - 20 mg/dL   Creatinine, Ser 0.85 0.61 - 1.24 mg/dL   Calcium 7.6 (L) 8.9 - 10.3 mg/dL   Total Protein 7.9 6.5 - 8.1 g/dL   Albumin 2.0 (L) 3.5 - 5.0 g/dL   AST 48 (H) 15 - 41 U/L   ALT 33 0 - 44 U/L   Alkaline Phosphatase 176 (H) 38 - 126 U/L   Total Bilirubin 1.1 0.3 - 1.2 mg/dL   GFR calc non Af Amer >60 >60 mL/min   GFR calc Af Amer >60 >60 mL/min   Anion gap 6 5 - 15  CBC WITH DIFFERENTIAL  Result Value Ref Range   WBC 0.2 (LL) 4.0 - 10.5 K/uL   RBC 2.09 (L) 4.22 - 5.81 MIL/uL   Hemoglobin 6.1 (LL) 13.0 - 17.0 g/dL   HCT 19.5 (L) 39.0 - 52.0 %   MCV 93.3 78.0 - 100.0 fL   MCH 29.2 26.0 - 34.0 pg   MCHC 31.3 30.0 - 36.0 g/dL   RDW 17.2 (H) 11.5 - 15.5 %   Platelets 81 (L) 150 - 400 K/uL   Neutrophils Relative % 0 %   Neutro Abs 0.0 1.7 - 7.7 K/uL   Lymphocytes Relative 90 %   Lymphs Abs 0.2 0.7 - 4.0 K/uL   Monocytes Relative 5 %   Monocytes Absolute 0.0 0.1 - 1.0 K/uL   Eosinophils Relative 0 %   Eosinophils Absolute 0.0 0.0 - 0.7 K/uL   Basophils Relative 5 %   Basophils Absolute 0.0 0.0 - 0.1 K/uL  Troponin I  Result Value Ref Range   Troponin I <0.03 <0.03 ng/mL  Lactic acid, plasma  Result Value Ref Range   Lactic Acid, Venous 1.1 0.5 - 1.9 mmol/L  Protime-INR  Result Value Ref Range   Prothrombin Time 20.6 (H) 11.4 - 15.2 seconds   INR 1.78   Brain natriuretic peptide  Result Value Ref Range   B Natriuretic Peptide 84.0 0.0 - 100.0 pg/mL  Magnesium  Result Value Ref Range   Magnesium 1.7 1.7 - 2.4 mg/dL   Dg Chest 2 View Result Date: 08/02/2018 CLINICAL DATA:  Fever, shortness of breath EXAM: CHEST - 2 VIEW COMPARISON:  08/02/2018 FINDINGS: Right Port-A-Cath in place with the tip in the right atrium, stable. Chronic volume loss and opacity throughout the right hemithorax, likely moderate to large right effusion and right lung airspace disease. No confluent opacity on the left. Heart is mildly enlarged. No change since prior study.  IMPRESSION: Stable probable moderate to large right effusion and right lung airspace disease. Volume loss on the right. Electronically Signed   By: Rolm Baptise M.D.   On: 08/02/2018 21:35   Results for TANAV, ORSAK (MRN [id number]) as of 08/02/2018 22:16  Ref. Range 07/17/2018 04:34 07/18/2018 04:47 07/25/2018 08:36 08/01/2018  11:20 08/02/2018 20:26  Hemoglobin Latest Ref Range: 13.0 - 17.0 g/dL 7.6 (L) 8.0 (L) 8.3 (L) 5.9 (LL) 6.1 (LL)  HCT Latest Ref Range: 39.0 - 52.0 % 23.8 (L) 24.4 (L) 27.0 (L) 18.9 (L) 19.5 (L)    2230:  CXR with airspace disease with neutropenia; IV abx started after [facility] and UC obtained. H/H continues lower than baseline despite PRBC transfusion today; will transfuse another unit PRBC's. No fever while in the ED. Platelets and LFT's within previous baseline range. Potassium repleted PO and IV. Magnesium repleted PO.  Dx and testing d/w pt.  Questions answered.  Verb understanding, agreeable to admit.  T/C returned from [facility]. Olevia Bowens, case discussed, including:  HPI, pertinent PM/SHx, VS/PE, dx testing, ED course and treatment:  Agreeable to admit.      Final Clinical Impressions(s) / ED Diagnoses   Final diagnoses:  None    ED Discharge Orders    None       Francine Graven, DO 08/03/18 2256

## 2018-08-02 NOTE — H&P (Signed)
History and Physical    Erik [name] IFO:[id number] DOB: September 07, 1961 DOA: 08/02/2018  PCP: [name], MD   Patient coming from: Home.  I have personally briefly reviewed patient's old medical records in [place]  Chief Complaint: Fever.  HPI: Erik [name] is a 57 y.o. male with medical history significant of Arnold-Chiari syndrome, osteoarthritis, COPD, dyspnea on exertion, stage IV adenocarcinoma of the lung, chronic back pain, depression, GERD, hypertension, history of pneumonia, history of PE, seasonal allergies, lumbar spinal stenosis, chemotherapy-induced pancytopenia who is coming to the emergency department due to fever.  The patient has been somnolent and is unable to provide further information.  ED Course: Initial vital signs temperature 99.9 F, pulse 116, respirations 19, blood pressure 110/57 mmHg and O2 sat 92% on room air.  Patient was given IV fluids, cefepime and vancomycin in the ED.  His urinalysis shows small hemoglobinuria, but was otherwise negative.  CBC showed a white count of 0.2, hemoglobin of 6.1 g/dL and platelets of 81.  PT was 20.6 seconds and INR 1.78.  Lactic acid was 1.1 mmol S/L.  Troponin less than 0.03 ng/mL.  Sodium was 134, potassium 2.7, chloride 95 and CO2 33 mmol/L.  BUN 80, creatinine 0.85, glucose 140 mg/dL.  Calcium was 7.6 mg/dL, but is normal once corrected to albumin.  Total protein 7.9 and albumin 2.0 g/dL.  AST was mildly elevated at 48 and alkaline phosphatase of 176 units/L.  His 2 view chest radiograph show a stable moderate to large right effusion and right lung airspace disease.  Review of Systems: Unable to obtain.  Past Medical History:  Diagnosis Date  . Arnold-Chiari syndrome ([place])   . Arthritis   . Asthma   . Chronic back pain   . COPD (chronic obstructive pulmonary disease) ([place])   . Depression   . Dyspnea    with exertion   . GERD (gastroesophageal reflux disease)   . Hypertension   . Pneumonia   .  Pulmonary embolus ([place])   . Seasonal allergies   . Spinal stenosis of lumbar region   . 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 Wheezing     Past Surgical History:  Procedure Laterality Date  . APPLICATION OF WOUND VAC  04/19/2018   Procedure: APPLICATION OF WOUND VAC;  Surgeon: [name], MD;  Location: [place];  Service: Vascular;;  . APPLICATION OF WOUND VAC Right 04/21/2018   Procedure: WOUND VAC CHANGE;  Surgeon: [name], MD;  Location: [place];  Service: Thoracic;  Laterality: Right;  . APPLICATION OF WOUND VAC N/A 04/24/2018   Procedure: WOUND VAC CHANGE;  Surgeon: [name], MD;  Location: [place];  Service: Thoracic;  Laterality: N/A;  . BACK SURGERY     5 total  . BIOPSY  01/03/2017   Procedure: BIOPSY;  Surgeon: Daneil Dolin, MD;  [facility];  Service: Endoscopy;;  gastric  . CHEST TUBE INSERTION Right 04/19/2018   Procedure: CHEST TUBE INSERTION;  Surgeon: [name], MD;  Location: [place];  Service: Vascular;  Laterality: Right;  . CHEST TUBE INSERTION Right 04/19/2018   Procedure: CHEST TUBE INSERTION;  Surgeon: [name], MD;  Location: [place];  Service: Thoracic;  Laterality: Right;  . COLONOSCOPY WITH PROPOFOL N/A 04/04/2017   Procedure: COLONOSCOPY WITH PROPOFOL;  Surgeon: Daneil Dolin, MD;  [facility];  Service: Endoscopy;  Laterality: N/A;  8:45am  . DECORTICATION  Right 03/28/2018   Procedure: DECORTICATION;  Surgeon: [name], MD;  Location: [facility];  Service: Thoracic;  Laterality: Right;  . ESOPHAGOGASTRODUODENOSCOPY (EGD) WITH PROPOFOL N/A 01/03/2017   Procedure: ESOPHAGOGASTRODUODENOSCOPY (EGD) WITH PROPOFOL;  Surgeon: Daneil Dolin, MD;  [facility];  Service: Endoscopy;  Laterality: N/A;  . I&D EXTREMITY Right 04/19/2018   Procedure: IRRIGATION AND DEBRIDEMENT RIGHT CHEST WOUND;  Surgeon: [name], MD;  Location: [place];  Service:  Vascular;  Laterality: Right;  . MALONEY DILATION N/A 01/03/2017   Procedure: Venia Minks DILATION;  Surgeon: Daneil Dolin, MD;  [facility];  Service: Endoscopy;  Laterality: N/A;  . MULTIPLE EXTRACTIONS WITH ALVEOLOPLASTY N/A 07/08/2014   Procedure: MULTIPLE EXTRACION WITH ALVEOLOPLASTY with EXCISION LESION RIGHT SIDE OF TONGUE;  Surgeon: Gae Bon, DDS;  Location: [place];  Service: Oral Surgery;  Laterality: N/A;  . PORTACATH PLACEMENT Right 07/30/2016   Procedure: INSERTION PORT-A-CATH;  Surgeon: Vickie Epley, MD;  [facility];  Service: Vascular;  Laterality: Right;  . THORACOTOMY  04/19/2018   Procedure: THORACOTOMY MAJOR;  Surgeon: [name], MD;  Location: [place];  Service: Vascular;;  . VIDEO ASSISTED THORACOSCOPY Right 04/21/2018   Procedure: VIDEO ASSISTED THORACOSCOPY;  Surgeon: [name], MD;  Location: [place];  Service: Thoracic;  Laterality: Right;  [name] VIDEO ASSISTED THORACOSCOPY Right 04/24/2018   Procedure: VIDEO ASSISTED THORACOSCOPY;  Surgeon: [name], MD;  Location: [place];  Service: Thoracic;  Laterality: Right;  [name] VIDEO ASSISTED THORACOSCOPY (VATS)/EMPYEMA Right 03/28/2018   Procedure: VIDEO ASSISTED THORACOSCOPY (VATS)/EMPYEMA;  Surgeon: [name], MD;  Location: [place];  Service: Thoracic;  Laterality: Right;  [name] VIDEO BRONCHOSCOPY WITH ENDOBRONCHIAL ULTRASOUND N/A 07/19/2016   Procedure: VIDEO BRONCHOSCOPY WITH ENDOBRONCHIAL ULTRASOUND;  Surgeon: [name], MD;  Location: [place];  Service: Thoracic;  Laterality: N/A;     reports that he quit smoking about 5 months ago. His smoking use included cigarettes. He has a 9.50 pack-year smoking history. He has quit using smokeless tobacco.  His smokeless tobacco use included chew. He reports that he drinks about 2.0 standard drinks of alcohol per week. He reports that he has current or past drug history. Drug: Marijuana. Frequency: 7.00 times per  week.  Allergies  Allergen Reactions  . Demeclocycline Swelling    SWELLING REACTION UNSPECIFIED   . Tetracyclines & Related Swelling    SWELLING REACTION UNSPECIFIED     Family History  Problem Relation Age of Onset  . Cancer Mother        breast cancer  . Heart failure Brother   . Colon cancer Neg Hx        not sure, ?grandfather and/or uncle    Prior to Admission medications   Medication Sig Start Date End Date Taking? Authorizing Provider  acetaminophen (TYLENOL) 325 MG tablet Take 2 tablets (650 mg total) by mouth every 6 (six) hours as needed for mild pain (or Fever >/= 101). 05/01/18   Barrett, Erin R, PA-C  albuterol (PROVENTIL HFA;VENTOLIN HFA) 108 (90 Base) MCG/ACT inhaler Inhale 2 puffs into the lungs every 6 (six) hours as needed for wheezing or shortness of breath. 06/28/18   Lockamy, Randi L, NP-C  albuterol (PROVENTIL) (2.5 MG/3ML) 0.083% nebulizer solution Take 3 mLs (2.5 mg total) by nebulization every 6 (six) hours as needed for wheezing or shortness of breath (if unable to experience relief by using combivent inhaler). 03/08/18   Barton Dubois,  MD  ALPRAZolam (XANAX) 1 MG tablet Take 1 mg by mouth 2 (two) times daily as needed for anxiety or sleep (takes 1 tablet and bedtime for sleep and 1 dose during the day only if needed).  06/26/16   [provider]  amLODipine (NORVASC) 5 MG tablet Take 5 mg by mouth daily.  05/17/18   [provider]  amoxicillin-clavulanate (AUGMENTIN) 875-125 MG tablet Take 1 tablet by mouth 2 (two) times daily for 7 days. 08/02/18 08/09/18  Glennie Isle, NP-C  Artificial Tear Solution (SOOTHE XP) SOLN Apply 2 drops to eye daily.    [provider]  bisacodyl (DULCOLAX) 5 MG EC tablet Take 1 tablet (5 mg total) by mouth daily as needed for moderate constipation. 07/18/18   Manuella Ghazi, Pratik D, DO  CARBOPLATIN IV Inject into the vein.    [provider]  ELIQUIS 5 MG TABS tablet  07/19/18   [provider]   escitalopram (LEXAPRO) 20 MG tablet Take 20 mg by mouth daily.  05/10/18   [provider]  fluconazole (DIFLUCAN) 100 MG tablet  08/02/18   [provider]  fluconazole (DIFLUCAN) 200 MG tablet Take 1 tablet (200 mg total) by mouth daily. 08/02/18   Lockamy, Randi L, NP-C  folic acid (FOLVITE) 1 MG tablet Take 1 tablet (1 mg total) by mouth daily. 06/27/18   Lockamy, Randi L, NP-C  furosemide (LASIX) 20 MG tablet Take 40 mg by mouth daily.    [provider]  gabapentin (NEURONTIN) 600 MG tablet Take 600 mg by mouth 2 (two) times daily.  08/19/17   [provider]  guaiFENesin (MUCINEX) 600 MG 12 hr tablet Take 600 mg by mouth 2 (two) times daily as needed (for congestion.).    [provider]  Ipratropium-Albuterol (COMBIVENT) 20-100 MCG/ACT AERS respimat Inhale 1 puff into the lungs every 6 (six) hours as needed for wheezing or shortness of breath. 03/08/18   Barton Dubois, MD  lactulose (CHRONULAC) 10 GM/15ML solution Take 15 mLs (10 g total) by mouth 3 (three) times daily as needed for mild constipation or moderate constipation. 01/23/18   Jacquelin Hawking, NP  levalbuterol Penne Lash) 0.63 MG/3ML nebulizer solution Take 3 mLs (0.63 mg total) by nebulization every 8 (eight) hours as needed for wheezing or shortness of breath. 05/01/18   Barrett, Erin R, PA-C  loperamide (IMODIUM) 2 MG capsule Take 1 capsule (2 mg total) by mouth as needed for diarrhea or loose stools. 04/08/18   Patrecia Pour, MD  methadone (DOLOPHINE) 10 MG tablet Take 20 mg by mouth 5 (five) times daily. Takes 2 tablets 5 times daily 06/26/16   [provider]  mirtazapine (REMERON) 15 MG tablet Take 1 tablet by mouth at bedtime. 08/17/17   [provider]  Misc. Devices MISC POC at 2 lpm via [place] with conserver / pulse ox on exertion. 05/03/18   Higgs, Mathis Dad, MD  Multiple Vitamin (MULTIVITAMIN WITH MINERALS) TABS tablet Take 1 tablet by mouth daily. Centrum    [provider]  ondansetron (ZOFRAN) 8 MG tablet TAKE 1 TABLET BY MOUTH EVERY 8 HOURS AS NEEDED FOR NAUSEA AND VOMITING. Patient taking differently: Take 8 mg by mouth every 8 (eight) hours as needed. TAKE 1 TABLET BY MOUTH EVERY 8 HOURS AS NEEDED FOR NAUSEA AND VOMITING. 03/02/18   Holley Bouche, NP  OXYGEN Inhale 3 L into the lungs daily.     [provider]  pantoprazole (PROTONIX) 40 MG  tablet Take 1 tablet (40 mg total) by mouth 2 (two) times daily. 06/14/18   Lockamy, Randi L, NP-C  PEMEtrexed Disodium (ALIMTA IV) Inject into the vein as directed.     [provider]  potassium chloride SA (K-DUR,KLOR-CON) 20 MEQ tablet Take 1 tablet (20 mEq total) by mouth daily. 03/14/18   Derek Jack, MD  prochlorperazine (COMPAZINE) 10 MG tablet Take 1 tablet (10 mg total) by mouth every 6 (six) hours as needed (Nausea or vomiting). 06/28/18   Derek Jack, MD  STIOLTO RESPIMAT 2.5-2.5 MCG/ACT AERS Inhale 1 puff into the lungs 3 (three) times daily. 07/04/18   [provider]  sucralfate (CARAFATE) 1 GM/10ML suspension carafate 1gm/84ml & viscous Lidocaine 2% 1:1 mixture.  Swish and swallow 1 tablespoon four times a day. 07/18/18   Derek Jack, MD  tiZANidine (ZANAFLEX) 4 MG tablet Take 4 mg by mouth every 8 (eight) hours as needed for muscle spasms.  06/03/16   [provider]  varenicline (CHANTIX) 0.5 MG tablet TAKE (2) TABLETS BY MOUTH TWICE DAILY. 04/11/18   Holley Bouche, NP    Physical Exam: Vitals:   08/02/18 2100 08/02/18 2130 08/02/18 2200 08/02/18 2230  BP: (!) 142/58 112/65 118/72 117/66  Pulse: (!) 119 (!) 110 (!) 108 (!) 110  Resp: 17 13 17  (!) 27  Temp:    98.5 F (36.9 C)  TempSrc:      SpO2: 98% 100% 100% 99%  Weight:      Height:        Constitutional: Looks acutely ill. Eyes: PERRL, lids and conjunctivae are pale. ENMT: Mucous membranes are moist. Posterior pharynx clear of any exudate or lesions.  Neck: normal,  supple, no masses, no thyromegaly Respiratory: Decreased breath sounds on RLL, no wheezing, no crackles. Normal respiratory effort. No accessory muscle use.  Cardiovascular: Tachycardic at 109 Bpm, no murmurs / rubs / gallops. No extremity edema. 2+ pedal pulses. No carotid bruits.  Abdomen: Obese, midline longitudinal abrasion, no tenderness, no masses palpated. No hepatosplenomegaly. Bowel sounds positive.  Musculoskeletal: no clubbing / cyanosis. Good ROM, no contractures. Normal muscle tone.  Skin: Small ecchymosis on extremities. Neurologic: Somnolent. Psychiatric: Somnolent.  Wakes up briefly.  Unable to fully evaluate.  Labs on Admission: I have personally reviewed following labs and imaging studies  CBC: Recent Labs  Lab 08/01/18 1120 08/02/18 2026  WBC 0.4* 0.2*  NEUTROABS 0.0* 0.0  HGB 5.9* 6.1*  HCT 18.9* 19.5*  MCV 96.4 93.3  PLT 152 81*   Basic Metabolic Panel: Recent Labs  Lab 08/02/18 2026  NA 134*  K 2.7*  CL 95*  CO2 33*  GLUCOSE 140*  BUN 8  CREATININE 0.85  CALCIUM 7.6*  MG 1.7   GFR: Estimated Creatinine Clearance: 119.6 mL/min (by C-G formula based on SCr of 0.85 mg/dL). Liver Function Tests: Recent Labs  Lab 08/02/18 2026  AST 48*  ALT 33  ALKPHOS 176*  BILITOT 1.1  PROT 7.9  ALBUMIN 2.0*   No results for input(s): LIPASE, AMYLASE in the last 168 hours. No results for input(s): AMMONIA in the last 168 hours. Coagulation Profile: Recent Labs  Lab 08/02/18 2026  INR 1.78   Cardiac Enzymes: Recent Labs  Lab 08/02/18 2026  TROPONINI <0.03   BNP (last 3 results) No results for input(s): PROBNP in the last 8760 hours. HbA1C: No results for input(s): HGBA1C in the last 72 hours. CBG: No results for input(s): GLUCAP in the last 168 hours. Lipid Profile:  No results for input(s): CHOL, HDL, LDLCALC, TRIG, CHOLHDL, LDLDIRECT in the last 72 hours. Thyroid Function Tests: No results for input(s): TSH, T4TOTAL, FREET4, T3FREE, THYROIDAB  in the last 72 hours. Anemia Panel: No results for input(s): VITAMINB12, FOLATE, FERRITIN, TIBC, IRON, RETICCTPCT in the last 72 hours. Urine analysis:    Component Value Date/Time   COLORURINE YELLOW 07/15/2018 1729   APPEARANCEUR CLEAR 07/15/2018 1729   LABSPEC 1.013 07/15/2018 1729   PHURINE 7.0 07/15/2018 1729   GLUCOSEU NEGATIVE 07/15/2018 1729   HGBUR MODERATE (A) 07/15/2018 1729   BILIRUBINUR NEGATIVE 07/15/2018 1729   KETONESUR NEGATIVE 07/15/2018 1729   PROTEINUR NEGATIVE 07/15/2018 1729   NITRITE NEGATIVE 07/15/2018 1729   LEUKOCYTESUR NEGATIVE 07/15/2018 1729    Radiological Exams on Admission: Dg Chest 2 View  Result Date: 08/02/2018 CLINICAL DATA:  Fever, shortness of breath EXAM: CHEST - 2 VIEW COMPARISON:  08/02/2018 FINDINGS: Right Port-A-Cath in place with the tip in the right atrium, stable. Chronic volume loss and opacity throughout the right hemithorax, likely moderate to large right effusion and right lung airspace disease. No confluent opacity on the left. Heart is mildly enlarged. No change since prior study. IMPRESSION: Stable probable moderate to large right effusion and right lung airspace disease. Volume loss on the right. Electronically Signed   By: Rolm Baptise M.D.   On: 08/02/2018 21:35   Dg Chest 2 View  Result Date: 08/02/2018 CLINICAL DATA:  Cough for 6 months slightly worsened; history of muscle partial of the RIGHT lung, asthma, COPD, hypertension EXAM: CHEST - 2 VIEW COMPARISON:  07/15/2018 FINDINGS: RIGHT jugular Port-A-Cath with tip projecting over RIGHT atrium. Volume loss in the RIGHT hemithorax with mediastinal shift to the RIGHT. Heart size stable. Chronic pleuroparenchymal opacities in the RIGHT hemithorax appear unchanged since the previous exam due to a combination of pleural thickening/effusion and subtotal collapse of the RIGHT lung, likely related to prior treatment for lung cancer. Slightly increased interstitial markings in LEFT lung could  represent atypical infiltrate or edema. Tiny pleural effusion blunts the posterior LEFT costophrenic angle. No pneumothorax. Bones demineralized. IMPRESSION: Chronic volume loss and opacity of the RIGHT hemithorax likely related to prior treatment for lung cancer. New interstitial infiltrates LEFT lung question atypical infection versus edema. Tiny LEFT pleural effusion. Electronically Signed   By: Lavonia Dana M.D.   On: 08/02/2018 20:42    EKG: Independently reviewed.  Vent. rate 113 BPM PR interval * ms QRS duration 90 ms QT/QTc 383/526 ms P-R-T axes 54 50 * Sinus tachycardia Low voltage, precordial leads Abnormal R-wave progression, early transition Prolonged QT interval Baseline wander in lead(s) I II aVR  Assessment/Plan Principal Problem:   Neutropenic fever (HCC) Admit to stepdown/inpatient. Continue supplemental oxygen. Cefepime 1 g every 8 hours. Vancomycin per pharmacy. Follow-up blood cultures and sensitivity. Consider evaluation by hematology if no improvement in white count.  Active Problems:   Symptomatic anemia Transfuse as needed. Monitor hematocrit and hemoglobin.    Adenocarcinoma of right lung [facility]) Continue treatment and follow-ups per oncology.    Essential hypertension Restart meds once his sister brings his medications or medication list.    History of pulmonary embolism He is supposed to be on Eliquis. However, no dosing shows on his medication profile. Please await for medication list from home.    Antineoplastic chemotherapy induced pancytopenia (HCC) Monitor CBC. Consider hematology evaluation if no improvement.    Hypokalemia Replacing. Follow-up potassium level.    Depression with anxiety Resume home meds once  med list available.    GERD Protonix 40 mg p.o. daily.   DVT prophylaxis: On Eliquis. Code Status: DNR. Family Communication:  Disposition Plan: Admit for IV antibiotic therapy for neutropenic fever. Consults called:    Admission status: Inpatient/telemetry.   Reubin Milan MD [facility] Pager [phone].  If 7PM-7AM, please contact night-coverage [email or web address] Password [facility]  08/02/2018, 11:03 PM

## 2018-08-02 NOTE — Progress Notes (Signed)
1135 Temp 100.4 Pt continues to c/o sore throat, SOB and fatigue as well as reporting blood tinged sputum noted in the last 12 hours. Dr. Delton Coombes informed of this information and approved blood transfusion today and ordered CXR,blood cultures x 2, Vancomycin and Fortaz.  [place] Dr. Delton Coombes reviewed pt's CXR results and assessed the pt. Diflucan was ordered PO for thrush and pt to return tomorrow for repeat antibiotics. VSS upon discharge. Pt discharged via wheelchair in stable condition accompanied by his sister

## 2018-08-02 NOTE — Progress Notes (Signed)
[place], [place] [address]   CLINIC:  Medical Oncology/Hematology  PCP:  [name], [place] [address] [phone]   REASON FOR VISIT:  Follow-up for adenocarcinoma of the right lung  CURRENT THERAPY: carboplatin and pemetrexed today he is receiving a blood transfusion  BRIEF ONCOLOGIC HISTORY:    Adenocarcinoma of right lung ([place])   07/01/2016 PET scan    6.3 x 3.7 cm right lower lobe mass, suspicious for primary bronchogenic neoplasm, as described above. Hypermetabolic thoracic nodal metastases, as above. Additional right perihilar hypermetabolism, indeterminate. Associated right middle lobe atelectasis/collapse.    07/19/2016 Procedure    Bronchoscopy with brushings and biopsies and endobronchial ultrasound with mediastinal lymph node aspirations by Dr. Roxan Hockey    07/21/2016 Pathology Results    Lung, biopsy, Right Middle Lobe - LUNG TISSUE WITH SQUAMOUS METAPLASIA. - NO MALIGNANCY IDENTIFIED.    07/21/2016 Pathology Results    FINE NEEDLE ASPIRATION, ENDOSCOPIC (A) LEVEL 7 (SPECIMEN 1 OF 3, COLLECTED ON 07/19/16): MALIGNANT CELLS CONSISTENT WITH ADENOCARCINOMA.    07/21/2016 Pathology Results    FINE NEEDLE ASPIRATION, EBUS, 4R, B (SPECIMEN 2 OF 3, COLLECTED 07/19/16): MALIGNANT CELLS CONSISTENT WITH ADENOCARCINOMA.    07/21/2016 Pathology Results    FINE NEEDLE ASPIRATION, EBUS, BRUSHING, RIGHT MIDDLE LOBE, D (SPECIMEN 3 OF 3, COLLECTED 07/19/16): MALIGNANT CELLS CONSISTENT WITH ADENOCARCINOMA.    07/22/2016 Imaging    MRI brain- No evidence of intracranial metastases.    08/17/2016 - 09/20/2016 Chemotherapy    The patient had palonosetron (ALOXI) injection 0.25 mg, 0.25 mg, Intravenous,  Once, 1 of 1 cycle  CISplatin (PLATINOL) 123 mg in sodium chloride 0.9 % 500 mL chemo infusion, 50 mg/m2 = 123 mg, Intravenous,  Once, 1 of 1 cycle  etoposide (VEPESID) 120 mg in sodium chloride 0.9 % 500  mL chemo infusion, 50 mg/m2 = 120 mg, Intravenous,  Once, 1 of 1 cycle  fosaprepitant (EMEND) 150 mg, dexamethasone (DECADRON) 12 mg in sodium chloride 0.9 % 145 mL IVPB, , Intravenous,  Once, 1 of 1 cycle  ondansetron (ZOFRAN) 8 mg in sodium chloride 0.9 % 50 mL IVPB, , Intravenous,  Once, 2 of 6 cycles  for chemotherapy treatment.      08/17/2016 -  Radiation Therapy       11/15/2016 Imaging    CT CAP- Interval response to therapy. The previously demonstrated mediastinal and right hilar adenopathy and resulting right middle lobe atelectasis have all improved. 2. No discrete residual lung masses are identified. There is new multifocal ground-glass opacity within the right lower lobe, and to a lesser extent in the left upper lobe. These are probably inflammatory/treatment related. 3. No evidence of abdominopelvic metastatic disease. Stable prominent lymph nodes in the upper abdomen, likely reactive. 4. Decompressed mid SVC without specific signs of SVC occlusion.    12/02/2016 -  Chemotherapy    Imfinzi (durvalumab) immunotherapy every 2 weeks x up to 1 year    01/03/2017 Procedure    EGD by Dr. [name], colonoscopy aborted as "patient forgot to take other half of preparation). Esophagitis. likely radiation-induced ?Dilated.  Erythematous mucosa in the stomach. Biopsied. Normal duodenal bulb and second portion of the duodenum.    02/14/2017 Imaging    CT chest- 1. Development of a small to moderate right-sided pleural effusion with minimal loculation anteriorly. 2. Worsened right-sided aeration with right middle and lower lobe consolidation. As this has a geographic distribution, this could be  radiation induced or represent infection. Depending on clinical concern of progressive disease, thoracentesis and/or PET may be informative. 3. Development of mild right paratracheal adenopathy, most likely reactive. Recommend attention on follow-up. 4. Subtle findings which are highly  suspicious for mild cirrhosis. Upper abdominal adenopathy is similar and likely reactive. 5. Persistent right lower and improved left upper lobe ground-glass opacities are favored to be infectious or inflammatory.    06/01/2017 Imaging    CT chest  IMPRESSION: 1. Evolving radiation changes in the medial right hemithorax. 2. Stable moderate size right pleural effusion without definite nodular components. 3. No signs of local recurrence or progressive metastatic disease. No residual enlarged thoracic lymph nodes. 4. Stable appearance of the visualized upper abdomen with probable cirrhosis and reactive adenopathy in the porta hepatis.    07/15/2017 Procedure    Therapeutic thoracentesis performed today; 1.3 L removed.  Fluid sent for cytology.     07/15/2017 Pathology Results    Pleural fluid NEGATIVE for malignancy by cytology.     09/30/2017 Genetic Testing    [employer] Liquid Results: MAP2K1 (MEK1) TP53    10/04/2017 Imaging    CT CAP: IMPRESSION: 1. Paramediastinal radiation change and loculated right pleural effusion appear unchanged from 07/14/2017. 2. Borderline prevascular lymph node within the anterior mediastinum is mildly increased in size from previous exam. 3. There is a new 5 mm lung nodule within the left lower lobe. Nonspecific in appearance. Attention on follow-up imaging advise. Other small nonspecific nodules in the left lung are stable. 4. Morphologic features of the liver compatible with cirrhosis. Enlarged upper abdominal lymph nodes are nonspecific and likely reactive. 5. Stable indeterminate low-attenuation focus in the posterior right liver dome. Previously characterized on MRI from 07/29/2017 as reflecting post treatment changes from external beam radiation to the lung. 6.  Emphysema (ICD10-J43.9).    06/28/2018 -  Chemotherapy    The patient had palonosetron (ALOXI) injection 0.25 mg, 0.25 mg, Intravenous,  Once, 2 of 6 cycles Administration:  0.25 mg (07/04/2018), 0.25 mg (07/25/2018) pegfilgrastim-cbqv (UDENYCA) injection 6 mg, 6 mg, Subcutaneous, Once, 2 of 6 cycles Administration: 6 mg (07/05/2018) PEMEtrexed (ALIMTA) 1,100 mg in sodium chloride 0.9 % 100 mL chemo infusion, 490 mg/m2 = 1,125 mg, Intravenous,  Once, 2 of 6 cycles Dose modification: 333.3333 mg/m2 (66.7 % of original dose 500 mg/m2, Cycle 3, Reason: Dose Not Tolerated) Administration: 1,100 mg (07/04/2018), 700 mg (07/25/2018) CARBOplatin (PARAPLATIN) 750 mg in sodium chloride 0.9 % 250 mL chemo infusion, 750 mg (100 % of original dose 750 mg), Intravenous,  Once, 2 of 6 cycles Dose modification:   (original dose 750 mg, Cycle 1), 450 mg (original dose 450 mg, Cycle 3, Reason: Dose not tolerated) Administration: 750 mg (07/04/2018), 450 mg (07/25/2018)  for chemotherapy treatment.       CANCER STAGING: Cancer Staging Adenocarcinoma of right lung [facility]) Staging form: Lung, AJCC 7th Edition - Clinical stage from 07/23/2016: Stage IIIA (T2b, N2, M0) - Signed by Baird Cancer, PA-C on 07/23/2016 - Clinical stage from 07/28/2017: Stage IV (M1a) - Signed by Holley Bouche, NP on 07/28/2017    INTERVAL HISTORY:  Mr. Sunde 57 y.o. male is here today to receive a blood transfusion. Patient was not scheduled for a visit today. He was seen today due to temp of 100.4 prior to receiving the blood transfusion. He had chills throughout the day. His tempeture returned to normal after the blood transfusion. He has been more fatigued throughout the day with  SOB. He had a chest xray to rule out pneumonia. Patient will come tomorrow to recheck his labs. Patient denies nausea or vomiting. He understands he need to go to the ER if he starts running a fever.     REVIEW OF SYSTEMS:  Review of Systems  Constitutional: Positive for chills, fatigue and fever.  HENT:   Positive for sore throat and trouble swallowing.   Respiratory: Positive for shortness of breath.   Cardiovascular:  Positive for leg swelling.  Skin: Positive for rash.  Neurological: Positive for extremity weakness.  Hematological: Bruises/bleeds easily.  All other systems reviewed and are negative.    PAST MEDICAL/SURGICAL HISTORY:  Past Medical History:  Diagnosis Date  . Arnold-Chiari syndrome ([place])   . Arthritis   . Asthma   . Chronic back pain   . COPD (chronic obstructive pulmonary disease) ([place])   . Depression   . Dyspnea    with exertion   . GERD (gastroesophageal reflux disease)   . Hypertension   . Pneumonia   . Pulmonary embolus ([place])   . Seasonal allergies   . Spinal stenosis of lumbar region   . 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 Wheezing    Past Surgical History:  Procedure Laterality Date  . APPLICATION OF WOUND VAC  04/19/2018   Procedure: APPLICATION OF WOUND VAC;  Surgeon: [name], MD;  Location: [place];  Service: Vascular;;  . APPLICATION OF WOUND VAC Right 04/21/2018   Procedure: WOUND VAC CHANGE;  Surgeon: [name], MD;  Location: [place];  Service: Thoracic;  Laterality: Right;  . APPLICATION OF WOUND VAC N/A 04/24/2018   Procedure: WOUND VAC CHANGE;  Surgeon: [name], MD;  Location: [place];  Service: Thoracic;  Laterality: N/A;  . BACK SURGERY     5 total  . BIOPSY  01/03/2017   Procedure: BIOPSY;  Surgeon: Daneil Dolin, MD;  [facility];  Service: Endoscopy;;  gastric  . CHEST TUBE INSERTION Right 04/19/2018   Procedure: CHEST TUBE INSERTION;  Surgeon: [name], MD;  Location: [facility];  Service: Vascular;  Laterality: Right;  . CHEST TUBE INSERTION Right 04/19/2018   Procedure: CHEST TUBE INSERTION;  Surgeon: [name], MD;  Location: [place];  Service: Thoracic;  Laterality: Right;  . COLONOSCOPY WITH PROPOFOL N/A 04/04/2017   Procedure: COLONOSCOPY WITH PROPOFOL;  Surgeon: Daneil Dolin, MD;  [facility];  Service: Endoscopy;  Laterality: N/A;  8:45am  .  DECORTICATION Right 03/28/2018   Procedure: DECORTICATION;  Surgeon: [name], MD;  Location[facility];  Service: Thoracic;  Laterality: Right;  . ESOPHAGOGASTRODUODENOSCOPY (EGD) WITH PROPOFOL N/A 01/03/2017   Procedure: ESOPHAGOGASTRODUODENOSCOPY (EGD) WITH PROPOFOL;  Surgeon: Daneil Dolin, MD;  [facility];  Service: Endoscopy;  Laterality: N/A;  . I&D EXTREMITY Right 04/19/2018   Procedure: IRRIGATION AND DEBRIDEMENT RIGHT CHEST WOUND;  Surgeon: [name], MD;  Location: [place];  Service: Vascular;  Laterality: Right;  . MALONEY DILATION N/A 01/03/2017   Procedure: Venia Minks DILATION;  Surgeon: Daneil Dolin, MD;  [facility];  Service: Endoscopy;  Laterality: N/A;  . MULTIPLE EXTRACTIONS WITH ALVEOLOPLASTY N/A 07/08/2014   Procedure: MULTIPLE EXTRACION WITH ALVEOLOPLASTY with EXCISION LESION RIGHT SIDE OF TONGUE;  Surgeon: Gae Bon, DDS;  Location: [place];  Service: Oral Surgery;  Laterality: N/A;  . PORTACATH PLACEMENT Right 07/30/2016   Procedure: INSERTION PORT-A-CATH;  Surgeon: Vickie Epley, MD;  [facility];  Service: Vascular;  Laterality: Right;  . THORACOTOMY  04/19/2018   Procedure: THORACOTOMY MAJOR;  Surgeon: [name], MD;  Location: [place];  Service: Vascular;;  . VIDEO ASSISTED THORACOSCOPY Right 04/21/2018   Procedure: VIDEO ASSISTED THORACOSCOPY;  Surgeon: [name], MD;  Location: [place];  Service: Thoracic;  Laterality: Right;  [name] VIDEO ASSISTED THORACOSCOPY Right 04/24/2018   Procedure: VIDEO ASSISTED THORACOSCOPY;  Surgeon: [name], MD;  Location: [place];  Service: Thoracic;  Laterality: Right;  [name] VIDEO ASSISTED THORACOSCOPY (VATS)/EMPYEMA Right 03/28/2018   Procedure: VIDEO ASSISTED THORACOSCOPY (VATS)/EMPYEMA;  Surgeon: [name], MD;  Location: [place];  Service: Thoracic;  Laterality: Right;  [name] VIDEO BRONCHOSCOPY WITH ENDOBRONCHIAL ULTRASOUND N/A 07/19/2016   Procedure: VIDEO  BRONCHOSCOPY WITH ENDOBRONCHIAL ULTRASOUND;  Surgeon: [name], MD;  Location: [place];  Service: Thoracic;  Laterality: N/A;     SOCIAL HISTORY:  Social History   Socioeconomic History  . Marital status: Divorced    Spouse name: Not on file  . Number of children: Not on file  . Years of education: Not on file  . Highest education level: Not on file  Occupational History  . Not on file  Social Needs  . Financial resource strain: Not on file  . Food insecurity:    Worry: Not on file    Inability: Not on file  . Transportation needs:    Medical: Not on file    Non-medical: Not on file  Tobacco Use  . Smoking status: Former Smoker    Packs/day: 0.25    Years: 38.00    Pack years: 9.50    Types: Cigarettes    Last attempt to quit: 02/16/2018    Years since quitting: 0.4  . Smokeless tobacco: Former Systems developer    Types: Chew  . Tobacco comment: [place] STAY  Substance and Sexual Activity  . Alcohol use: Yes    Alcohol/week: 2.0 standard drinks    Types: 2 Cans of beer per week    Comment: reports drinking 0-2 beers a week  . Drug use: Yes    Frequency: 7.0 times per week    Types: Marijuana    Comment: daily for cancer; pt denies any use at present time 08/23/17  . Sexual activity: Not Currently  Lifestyle  . Physical activity:    Days per week: Not on file    Minutes per session: Not on file  . Stress: Not on file  Relationships  . Social connections:    Talks on phone: Not on file    Gets together: Not on file    Attends religious service: Not on file    Active member of club or organization: Not on file    Attends meetings of clubs or organizations: Not on file    Relationship status: Not on file  . Intimate partner violence:    Fear of current or ex partner: Not on file    Emotionally abused: Not on file    Physically abused: Not on file    Forced sexual activity: Not on file  Other Topics Concern  . Not on file  Social History  Narrative  . Not on file    FAMILY HISTORY:  Family History  Problem Relation Age of Onset  . Cancer Mother        breast cancer  . Heart failure Brother   . Colon cancer Neg  Hx        not sure, ?grandfather and/or uncle    CURRENT MEDICATIONS:  Outpatient Encounter Medications as of 08/02/2018  Medication Sig  . acetaminophen (TYLENOL) 325 MG tablet Take 2 tablets (650 mg total) by mouth every 6 (six) hours as needed for mild pain (or Fever >/= 101).  [name] albuterol (PROVENTIL HFA;VENTOLIN HFA) 108 (90 Base) MCG/ACT inhaler Inhale 2 puffs into the lungs every 6 (six) hours as needed for wheezing or shortness of breath.  [name] albuterol (PROVENTIL) (2.5 MG/3ML) 0.083% nebulizer solution Take 3 mLs (2.5 mg total) by nebulization every 6 (six) hours as needed for wheezing or shortness of breath (if unable to experience relief by using combivent inhaler).  . ALPRAZolam (XANAX) 1 MG tablet Take 1 mg by mouth 2 (two) times daily as needed for anxiety or sleep (takes 1 tablet and bedtime for sleep and 1 dose during the day only if needed).   [name] amLODipine (NORVASC) 5 MG tablet Take 5 mg by mouth daily.   [name] amoxicillin-clavulanate (AUGMENTIN) 875-125 MG tablet Take 1 tablet by mouth 2 (two) times daily for 7 days.  . Artificial Tear Solution (SOOTHE XP) SOLN Apply 2 drops to eye daily.  . bisacodyl (DULCOLAX) 5 MG EC tablet Take 1 tablet (5 mg total) by mouth daily as needed for moderate constipation.  [name] CARBOPLATIN IV Inject into the vein.  [name] ELIQUIS 5 MG TABS tablet   . escitalopram (LEXAPRO) 20 MG tablet Take 20 mg by mouth daily.   . fluconazole (DIFLUCAN) 200 MG tablet Take 1 tablet (200 mg total) by mouth daily.  . folic acid (FOLVITE) 1 MG tablet Take 1 tablet (1 mg total) by mouth daily.  . furosemide (LASIX) 20 MG tablet Take 40 mg by mouth daily.  [name] gabapentin (NEURONTIN) 600 MG tablet Take 600 mg by mouth 2 (two) times daily.   [name] guaiFENesin (MUCINEX) 600 MG 12 hr tablet Take 600 mg by  mouth 2 (two) times daily as needed (for congestion.).  [name] Ipratropium-Albuterol (COMBIVENT) 20-100 MCG/ACT AERS respimat Inhale 1 puff into the lungs every 6 (six) hours as needed for wheezing or shortness of breath.  . lactulose (CHRONULAC) 10 GM/15ML solution Take 15 mLs (10 g total) by mouth 3 (three) times daily as needed for mild constipation or moderate constipation.  [name] levalbuterol (XOPENEX) 0.63 MG/3ML nebulizer solution Take 3 mLs (0.63 mg total) by nebulization every 8 (eight) hours as needed for wheezing or shortness of breath.  . loperamide (IMODIUM) 2 MG capsule Take 1 capsule (2 mg total) by mouth as needed for diarrhea or loose stools.  . methadone (DOLOPHINE) 10 MG tablet Take 20 mg by mouth 5 (five) times daily. Takes 2 tablets 5 times daily  . mirtazapine (REMERON) 15 MG tablet Take 1 tablet by mouth at bedtime.  . Misc. Devices MISC POC at 2 lpm via [place] with conserver / pulse ox on exertion.  . Multiple Vitamin (MULTIVITAMIN WITH MINERALS) TABS tablet Take 1 tablet by mouth daily. Centrum  . ondansetron (ZOFRAN) 8 MG tablet TAKE 1 TABLET BY MOUTH EVERY 8 HOURS AS NEEDED FOR NAUSEA AND VOMITING. (Patient taking differently: Take 8 mg by mouth every 8 (eight) hours as needed. TAKE 1 TABLET BY MOUTH EVERY 8 HOURS AS NEEDED FOR NAUSEA AND VOMITING.)  . OXYGEN Inhale 3 L into the lungs daily.   . pantoprazole (PROTONIX) 40 MG tablet Take 1 tablet (40 mg total) by mouth 2 (two) times daily.  [name]  PEMEtrexed Disodium (ALIMTA IV) Inject into the vein as directed.   . potassium chloride SA (K-DUR,KLOR-CON) 20 MEQ tablet Take 1 tablet (20 mEq total) by mouth daily.  . prochlorperazine (COMPAZINE) 10 MG tablet Take 1 tablet (10 mg total) by mouth every 6 (six) hours as needed (Nausea or vomiting).  . STIOLTO RESPIMAT 2.5-2.5 MCG/ACT AERS Inhale 1 puff into the lungs 3 (three) times daily.  . sucralfate (CARAFATE) 1 GM/10ML suspension carafate 1gm/65m & viscous Lidocaine 2% 1:1 mixture.  Swish  and swallow 1 tablespoon four times a day.  .Marland KitchentiZANidine (ZANAFLEX) 4 MG tablet Take 4 mg by mouth every 8 (eight) hours as needed for muscle spasms.   . varenicline (CHANTIX) 0.5 MG tablet TAKE (2) TABLETS BY MOUTH TWICE DAILY.   Facility-Administered Encounter Medications as of 08/02/2018  Medication  . 0.9 %  sodium chloride infusion  . [COMPLETED] sodium chloride flush (NS) 0.9 % injection 10 mL    ALLERGIES:  Allergies  Allergen Reactions  . Demeclocycline Swelling    SWELLING REACTION UNSPECIFIED   . Tetracyclines & Related Swelling    SWELLING REACTION UNSPECIFIED      PHYSICAL EXAM:  ECOG Performance status: 1 I have reviewed his vitals. Physical Exam  Constitutional: He is oriented to person, place, and time.  Cardiovascular: Normal rate, regular rhythm and normal heart sounds.  Pulmonary/Chest: He has wheezes.  Lymphadenopathy:    He has cervical adenopathy.  Neurological: He is alert and oriented to person, place, and time.  Skin: Skin is warm and dry.  Supraclavicular lymph node on the left side is stable. Extremities: 2+ edema bilaterally.   LABORATORY DATA:  I have reviewed the labs as listed.  CBC    Component Value Date/Time   WBC 0.4 (LL) 08/01/2018 1120   RBC 1.96 (L) 08/01/2018 1120   HGB 5.9 (LL) 08/01/2018 1120   HCT 18.9 (L) 08/01/2018 1120   PLT 152 08/01/2018 1120   MCV 96.4 08/01/2018 1120   MCH 30.1 08/01/2018 1120   MCHC 31.2 08/01/2018 1120   RDW 18.1 (H) 08/01/2018 1120   LYMPHSABS 0.4 (L) 08/01/2018 1120   MONOABS 0.0 (L) 08/01/2018 1120   EOSABS 0.0 08/01/2018 1120   BASOSABS 0.0 08/01/2018 1120   CMP Latest Ref Rng & Units 07/25/2018 07/18/2018 07/16/2018  Glucose 70 - 99 mg/dL 130(H) 107(H) 156(H)  BUN 6 - 20 mg/dL 7 9 21(H)  Creatinine 0.61 - 1.24 mg/dL 1.09 0.56(L) 0.65  Sodium 135 - 145 mmol/L 136 136 135  Potassium 3.5 - 5.1 mmol/L 3.2(L) 3.7 3.6  Chloride 98 - 111 mmol/L 99 99 103  CO2 22 - 32 mmol/L 30 30 26   Calcium  8.9 - 10.3 mg/dL 7.8(L) 8.3(L) 7.8(L)  Total Protein 6.5 - 8.1 g/dL 8.2(H) - -  Total Bilirubin 0.3 - 1.2 mg/dL 0.6 - -  Alkaline Phos 38 - 126 U/L 214(H) - -  AST 15 - 41 U/L 44(H) - -  ALT 0 - 44 U/L 39 - -       DIAGNOSTIC IMAGING:  I have independently reviewed his chest x-ray and compared it with prior x-rays.    ASSESSMENT & PLAN:   [place] of right lung [place].  Stage IV adenocarcinoma of the right lung: - Initially treated with cisplatin and VP-16 for stage III disease followed by Imfinzi from 12/02/2016 through 06/30/2017.  He reportedly had progression during durvalumab treatment. -Tecentriq every 3 weeks from 08/04/2017 through 02/16/2018. 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  Chest tube was removed last week.  He has a follow-up with Dr. Roxan Hockey tomorrow for possible removal of VAC. - Left supraclavicular adenopathy was noted in May 2019.  Underwent needle biopsy on 05/15/2018 which shows metastatic poorly differentiated adenocarcinoma consistent with his lung primary. - Wound VAC was removed about 2 weeks ago.  His energy levels are low but stable.  We discussed the results of the CT scan of the chest dated 06/19/2018 which showed progressive left supraclavicular, axillary and subpectoral adenopathy.  Mediastinal/hilar adenopathy and upper abdominal adenopathy is relatively stable.  Progressive dense right upper lobe airspace opacification and interstitial thickening.  Persistent loculated pleural fluid collection of the right lung base which now contains gas. - His PDL 1 testing shows TPS score of 50%.  Condition 1 report shows MS-stable, TMB-intermediate, MEK 1 and other mutations.  None of the mutations have any targetable treatments.  Hence I have recommended chemotherapy with carboplatin and pemetrexed. - He received  first cycle of carboplatin (AUC 5) and pemetrexed on 07/04/2018.  He was admitted to the hospital with severe mucositis and severe cytopenias.  He required 1 unit of PRBC.  He also had some hemoptysis when his platelet count went into single digits.  His Eliquis was held during that time and was restarted back.  He is energy levels are slowly coming back to normal. - He received cycle 2 at 33% dose reduction on 07/25/2018. - We have reviewed his blood counts.  His hemoglobin dropped to 5.4.  He received 1 unit of PRBC yesterday.  He will receive 1 unit of PRBCs today.  He will come back in 2 weeks for follow-up. -He was noted to have a temperature of 100.4 prior to his blood transfusion.  We have sent blood cultures.  I have done a chest x-ray in the office.  I have reviewed the chest x-ray which did not show any changes from his prior x-ray 3 weeks ago. -He did report burning sensation in his throat.  Soft palate has thrush.  We gave him some Diflucan.  He will continue Diflucan for 5 days. -For his neutropenic fever, we have given vancomycin and Fortaz.  He was reluctant to be admitted to the hospital.  We have given Augmentin 875 mg twice daily to be taken at home.  He was told to go to the ER if he develops any temperature more than 100.5. -We will follow him tomorrow with repeat blood counts and possible IV antibiotics.  2.  Lower extremity swelling: He will continue Lasix daily.  This is stable.  3.  Pulmonary embolism: He is continuing Eliquis.  No major hemoptysis reported.  4.  Normocytic anemia: We have done work-up which included normal SPEP.  A54 and folic acid were normal.  Ferritin was 81 with percent saturation of 11.  I have recommended Feraheme infusion.  But he ended up having at least 4 transfusions in the meanwhile.  We will plan to reevaluate his ferritin and iron panel.      Orders placed this encounter:  Orders Placed This Encounter  Procedures  . CBC with  Differential/Platelet  . Comprehensive metabolic panel      Derek Jack, MD [place] [phone]

## 2018-08-02 NOTE — Progress Notes (Signed)
Pharmacy Note:  Initial antibiotic(s) regimen of Vancomycin and Cefepime ordered by EDP to treat Febrile Neutropenia.  Estimated Creatinine Clearance: 93.3 mL/min (by C-G formula based on SCr of 1.09 mg/dL).   Allergies  Allergen Reactions  . Demeclocycline Swelling    SWELLING REACTION UNSPECIFIED   . Tetracyclines & Related Swelling    SWELLING REACTION UNSPECIFIED     Vitals:   08/02/18 2000 08/02/18 2100  BP: (!) 102/48 (!) 142/58  Pulse: (!) 114 (!) 119  Resp: 16 17  Temp:    SpO2: 96% 98%    Anti-infectives (From admission, onward)   Start     Dose/Rate Route Frequency Ordered Stop   08/02/18 2200  ceFEPIme (MAXIPIME) 2 g in sodium chloride 0.9 % 100 mL IVPB     2 g 200 mL/hr over 30 Minutes Intravenous Every 8 hours 08/02/18 2128     08/02/18 2145  vancomycin (VANCOCIN) 1,500 mg in sodium chloride 0.9 % 500 mL IVPB     1,500 mg 250 mL/hr over 120 Minutes Intravenous  Once 08/02/18 2134        Antimicrobials this admission:  Vanc 8/28 >>  Cefepime 8/28 >>   Dose adjustments this admission:  n/a  Microbiology results:  8/28 BCx: pending  UCx:    Sputum:    MRSA PCR:    Plan: Initial dose(s) of Vancomycin and Cefepime  X 1 ordered. F/U admission orders for further dosing if therapy continued.  Pricilla Larsson, [facility] 08/02/2018 9:34 PM

## 2018-08-02 NOTE — ED Notes (Signed)
Date and time results received: 08/02/18 2126 (use smartphrase ".now" to insert current time)  Test: Hgb Critical Value: 6.1  Name of Provider Notified: Dr Thurnell Garbe  Orders Received? Or Actions Taken?:

## 2018-08-03 ENCOUNTER — Ambulatory Visit (HOSPITAL_COMMUNITY): Payer: Medicare Other

## 2018-08-03 ENCOUNTER — Other Ambulatory Visit: Payer: Self-pay

## 2018-08-03 DIAGNOSIS — I10 Essential (primary) hypertension: Secondary | ICD-10-CM

## 2018-08-03 DIAGNOSIS — Z86711 Personal history of pulmonary embolism: Secondary | ICD-10-CM

## 2018-08-03 DIAGNOSIS — D696 Thrombocytopenia, unspecified: Secondary | ICD-10-CM

## 2018-08-03 DIAGNOSIS — D61810 Antineoplastic chemotherapy induced pancytopenia: Secondary | ICD-10-CM

## 2018-08-03 DIAGNOSIS — T451X5A Adverse effect of antineoplastic and immunosuppressive drugs, initial encounter: Secondary | ICD-10-CM

## 2018-08-03 DIAGNOSIS — E876 Hypokalemia: Secondary | ICD-10-CM

## 2018-08-03 LAB — CBC WITH DIFFERENTIAL/PLATELET
BASOS PCT: 3 %
Basophils Absolute: 0 10*3/uL (ref 0.0–0.1)
EOS ABS: 0 10*3/uL (ref 0.0–0.7)
Eosinophils Relative: 0 %
HCT: 20.9 % — ABNORMAL LOW (ref 39.0–52.0)
Hemoglobin: 6.5 g/dL — CL (ref 13.0–17.0)
Lymphocytes Relative: 89 %
Lymphs Abs: 0.4 10*3/uL — ABNORMAL LOW (ref 0.7–4.0)
MCH: 28.8 pg (ref 26.0–34.0)
MCHC: 31.1 g/dL (ref 30.0–36.0)
MCV: 92.5 fL (ref 78.0–100.0)
MONO ABS: 0 10*3/uL — AB (ref 0.1–1.0)
Monocytes Relative: 3 %
NEUTROS PCT: 5 %
Neutro Abs: 0 10*3/uL — ABNORMAL LOW (ref 1.7–7.7)
PLATELETS: 61 10*3/uL — AB (ref 150–400)
RBC: 2.26 MIL/uL — ABNORMAL LOW (ref 4.22–5.81)
RDW: 17.2 % — ABNORMAL HIGH (ref 11.5–15.5)
WBC: 0.4 10*3/uL — CL (ref 4.0–10.5)

## 2018-08-03 LAB — COMPREHENSIVE METABOLIC PANEL
ALT: 31 U/L (ref 0–44)
AST: 39 U/L (ref 15–41)
Albumin: 1.9 g/dL — ABNORMAL LOW (ref 3.5–5.0)
Alkaline Phosphatase: 160 U/L — ABNORMAL HIGH (ref 38–126)
Anion gap: 4 — ABNORMAL LOW (ref 5–15)
BUN: 7 mg/dL (ref 6–20)
CALCIUM: 7.7 mg/dL — AB (ref 8.9–10.3)
CO2: 34 mmol/L — AB (ref 22–32)
CREATININE: 0.77 mg/dL (ref 0.61–1.24)
Chloride: 99 mmol/L (ref 98–111)
Glucose, Bld: 84 mg/dL (ref 70–99)
Potassium: 3 mmol/L — ABNORMAL LOW (ref 3.5–5.1)
Sodium: 137 mmol/L (ref 135–145)
Total Bilirubin: 1.4 mg/dL — ABNORMAL HIGH (ref 0.3–1.2)
Total Protein: 7.4 g/dL (ref 6.5–8.1)

## 2018-08-03 LAB — BPAM RBC
Blood Product Expiration Date: 201909262359
Blood Product Expiration Date: 201909282359
ISSUE DATE / TIME: 201908271426
ISSUE DATE / TIME: 201908281138
UNIT TYPE AND RH: 9500
Unit Type and Rh: 9500

## 2018-08-03 LAB — TYPE AND SCREEN
ABO/RH(D): O NEG
Antibody Screen: NEGATIVE
UNIT DIVISION: 0
UNIT DIVISION: 0

## 2018-08-03 LAB — PREPARE RBC (CROSSMATCH)

## 2018-08-03 MED ORDER — METHADONE HCL 10 MG PO TABS
20.0000 mg | ORAL_TABLET | Freq: Every day | ORAL | Status: DC
  Administered 2018-08-03 – 2018-08-05 (×9): 20 mg via ORAL
  Filled 2018-08-03 (×10): qty 2

## 2018-08-03 MED ORDER — LIDOCAINE VISCOUS HCL 2 % MT SOLN
5.0000 mL | Freq: Three times a day (TID) | OROMUCOSAL | Status: DC
  Administered 2018-08-03 – 2018-08-05 (×6): 5 mL via OROMUCOSAL
  Filled 2018-08-03 (×6): qty 15

## 2018-08-03 MED ORDER — SUCRALFATE 1 GM/10ML PO SUSP: 1.0000 g | Freq: Three times a day (TID) | ORAL | Status: DC

## 2018-08-03 MED ORDER — ESCITALOPRAM OXALATE 10 MG PO TABS
20.0000 mg | ORAL_TABLET | Freq: Every day | ORAL | Status: DC
  Administered 2018-08-03 – 2018-08-05 (×3): 20 mg via ORAL
  Filled 2018-08-03 (×3): qty 2

## 2018-08-03 MED ORDER — FLUCONAZOLE 100 MG PO TABS: 200.0000 mg | ORAL_TABLET | Freq: Once | ORAL | Status: DC

## 2018-08-03 MED ORDER — LEVALBUTEROL HCL 0.63 MG/3ML IN NEBU: 0.6300 mg | INHALATION_SOLUTION | Freq: Three times a day (TID) | RESPIRATORY_TRACT | Status: DC | PRN

## 2018-08-03 MED ORDER — SODIUM CHLORIDE 0.9% IV SOLUTION
Freq: Once | INTRAVENOUS | Status: AC
  Administered 2018-08-03: 10:00:00 via INTRAVENOUS

## 2018-08-03 MED ORDER — GABAPENTIN 300 MG PO CAPS
600.0000 mg | ORAL_CAPSULE | Freq: Two times a day (BID) | ORAL | Status: DC
  Administered 2018-08-03 – 2018-08-05 (×4): 600 mg via ORAL
  Filled 2018-08-03 (×13): qty 2

## 2018-08-03 MED ORDER — ALPRAZOLAM 0.5 MG PO TABS
1.0000 mg | ORAL_TABLET | Freq: Two times a day (BID) | ORAL | Status: DC | PRN
  Administered 2018-08-03 – 2018-08-04 (×2): 1 mg via ORAL
  Filled 2018-08-03 (×2): qty 2

## 2018-08-03 MED ORDER — VANCOMYCIN HCL IN DEXTROSE 1-5 GM/200ML-% IV SOLN
1000.0000 mg | Freq: Two times a day (BID) | INTRAVENOUS | Status: DC
  Administered 2018-08-03 – 2018-08-04 (×3): 1000 mg via INTRAVENOUS
  Filled 2018-08-03 (×3): qty 200

## 2018-08-03 MED ORDER — ALPRAZOLAM 0.5 MG PO TABS
1.0000 mg | ORAL_TABLET | Freq: Every evening | ORAL | Status: DC | PRN
  Administered 2018-08-03: 1 mg via ORAL
  Filled 2018-08-03: qty 2

## 2018-08-03 MED ORDER — MIRTAZAPINE 15 MG PO TABS
15.0000 mg | ORAL_TABLET | Freq: Every day | ORAL | Status: DC
  Administered 2018-08-03 – 2018-08-04 (×2): 15 mg via ORAL
  Filled 2018-08-03 (×2): qty 1

## 2018-08-03 MED ORDER — PANTOPRAZOLE SODIUM 40 MG PO TBEC
40.0000 mg | DELAYED_RELEASE_TABLET | Freq: Two times a day (BID) | ORAL | Status: DC
  Administered 2018-08-03 – 2018-08-05 (×4): 40 mg via ORAL
  Filled 2018-08-03 (×5): qty 1

## 2018-08-03 MED ORDER — FLUCONAZOLE 100 MG PO TABS
200.0000 mg | ORAL_TABLET | Freq: Every day | ORAL | Status: DC
  Administered 2018-08-03 – 2018-08-05 (×3): 200 mg via ORAL
  Filled 2018-08-03 (×3): qty 2

## 2018-08-03 MED ORDER — MAGIC MOUTHWASH
5.0000 mL | Freq: Three times a day (TID) | ORAL | Status: DC
  Administered 2018-08-03 – 2018-08-05 (×6): 5 mL via ORAL
  Filled 2018-08-03 (×6): qty 5

## 2018-08-03 MED ORDER — PNEUMOCOCCAL VAC POLYVALENT 25 MCG/0.5ML IJ INJ
0.5000 mL | INJECTION | INTRAMUSCULAR | Status: AC
  Administered 2018-08-04: 0.5 mL via INTRAMUSCULAR
  Filled 2018-08-03: qty 0.5

## 2018-08-03 MED ORDER — POTASSIUM CHLORIDE CRYS ER 20 MEQ PO TBCR
40.0000 meq | EXTENDED_RELEASE_TABLET | Freq: Once | ORAL | Status: AC
  Administered 2018-08-03: 40 meq via ORAL
  Filled 2018-08-03: qty 2

## 2018-08-03 MED ORDER — TIZANIDINE HCL 2 MG PO TABS: 4.0000 mg | ORAL_TABLET | Freq: Three times a day (TID) | ORAL | Status: DC | PRN

## 2018-08-03 MED ORDER — LACTULOSE 10 GM/15ML PO SOLN: 10.0000 g | Freq: Three times a day (TID) | ORAL | Status: DC | PRN

## 2018-08-03 MED ORDER — GUAIFENESIN ER 600 MG PO TB12
600.0000 mg | ORAL_TABLET | Freq: Two times a day (BID) | ORAL | Status: DC | PRN
  Filled 2018-08-03: qty 1

## 2018-08-03 MED ORDER — MAGIC MOUTHWASH W/LIDOCAINE: 10.0000 mL | Freq: Three times a day (TID) | ORAL | Status: DC

## 2018-08-03 MED ORDER — ENSURE ENLIVE PO LIQD
237.0000 mL | Freq: Two times a day (BID) | ORAL | Status: DC
  Administered 2018-08-04 – 2018-08-05 (×3): 237 mL via ORAL

## 2018-08-03 MED ORDER — FUROSEMIDE 40 MG PO TABS
40.0000 mg | ORAL_TABLET | Freq: Every day | ORAL | Status: DC
  Administered 2018-08-03 – 2018-08-05 (×3): 40 mg via ORAL
  Filled 2018-08-03 (×3): qty 1

## 2018-08-03 NOTE — Care Management (Signed)
Patient is active with [facility] for RN and PT.

## 2018-08-03 NOTE — Progress Notes (Signed)
PROGRESS NOTE    KARY COLAIZZI  PVV:[id number] DOB: Aug 24, 1961 DOA: 08/02/2018 PCP: [name], MD    Brief Narrative:  57 year old male with a history of adenocarcinoma with a long, currently on chemotherapy, presents with neutropenic fever.  Found to have significant cytopenias related to chemotherapy's.  He is receiving 2 units PRBC, started on intravenous antibiotics with cultures in process.  Continue to monitor for recurrence of fever.   Assessment & Plan:   Principal Problem:   Neutropenic fever ([place]) Active Problems:   Depression with anxiety   GERD   Adenocarcinoma of right lung ([place])   Essential hypertension   History of pulmonary embolism   Antineoplastic chemotherapy induced pancytopenia (HCC)   Hypokalemia   Symptomatic anemia   1. Neutropenic fever.  No obvious source of infection, patient was febrile.  Cultures are in process.  Continue vancomycin and cefepime. 2. Pancytopenia, related to recent chemotherapy.  For anemia, 2 units of PRBC has been ordered.  Continue to monitor thrombocytopenia and hold anticoagulants for now. 3. 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  Follow-up with oncology for further chemotherapy.  Currently shortness of breath appears to be at baseline. 4. Chronic respiratory failure with hypoxia.  On home oxygen requirement at this time.  No signs of distress. 5. History of pulmonary embolism.  Chronically on Eliquis.  Will hold for now since he is thrombocytopenic.  Resume once platelets have improved. 6. Hypokalemia.  Replace. 7. GERD.  Continue on PPI 8. Chronic pain syndrome.  Continue on methadone as per outpatient dosing. 9. COPD.  Continue bronchodilators as needed.   DVT prophylaxis: scd Code Status: DNR Family Communication: No family present Disposition Plan: Discharge home once he is remained afebrile and cultures are negative.   Consultants:     Procedures:      Antimicrobials:   Cefepime 8/28 >  Vancomycin 8/28 >   Subjective: Feeling better. No cough. Feels that breathing is at baseline.  Objective: Vitals:   08/03/18 1500 08/03/18 1600 08/03/18 1612 08/03/18 1705  BP: 117/65 124/77 122/80   Pulse: (!) 117 (!) 118 (!) 118   Resp: 16 14 17    Temp:   97.9 F (36.6 C) 99.2 F (37.3 C)  TempSrc:   Axillary Oral  SpO2: 97% 99% 100%   Weight:      Height:        Intake/Output Summary (Last 24 hours) at 08/03/2018 1707 Last data filed at 08/03/2018 1600 Gross per 24 hour  Intake 2209.83 ml  Output 2450 ml  Net -240.17 ml   Filed Weights   08/02/18 1944 08/03/18 0134 08/03/18 0500  Weight: 98.9 kg 102.6 kg 102.6 kg    Examination:  General exam: Appears calm and comfortable  Respiratory system: diminished breath sounds on right side. Respiratory effort normal. Cardiovascular system: S1 & S2 heard, tachycardic. No JVD, murmurs, rubs, gallops or clicks. 1+ pedal edema. Gastrointestinal system: Abdomen is nondistended, soft and nontender. No organomegaly or masses felt. Normal bowel sounds heard. Central nervous system: Alert and oriented. No focal neurological deficits. Extremities: Symmetric 5 x 5 power. Skin: No rashes, lesions or ulcers Psychiatry: Judgement and insight appear normal. Mood & affect appropriate.     Data Reviewed: I have personally reviewed following labs and imaging studies  CBC: Recent Labs  Lab 08/01/18 1120 08/02/18 2026 08/03/18 0556  WBC 0.4* 0.2* 0.4*  NEUTROABS 0.0* 0.0 0.0*  HGB 5.9* 6.1* 6.5*  HCT 18.9* 19.5* 20.9*  MCV 96.4  93.3 92.5  PLT 152 81* 61*   Basic Metabolic Panel: Recent Labs  Lab 08/02/18 2026 08/03/18 0556  NA 134* 137  K 2.7* 3.0*  CL 95* 99  CO2 33* 34*  GLUCOSE 140* 84  BUN 8 7  CREATININE 0.85 0.77  CALCIUM 7.6* 7.7*  MG 1.7  --    GFR: Estimated Creatinine Clearance: 129.3 mL/min (by C-G formula based on SCr of 0.77 mg/dL). Liver Function  Tests: Recent Labs  Lab 08/02/18 2026 08/03/18 0556  AST 48* 39  ALT 33 31  ALKPHOS 176* 160*  BILITOT 1.1 1.4*  PROT 7.9 7.4  ALBUMIN 2.0* 1.9*   No results for input(s): LIPASE, AMYLASE in the last 168 hours. No results for input(s): AMMONIA in the last 168 hours. Coagulation Profile: Recent Labs  Lab 08/02/18 2026  INR 1.78   Cardiac Enzymes: Recent Labs  Lab 08/02/18 2026  TROPONINI <0.03   BNP (last 3 results) No results for input(s): PROBNP in the last 8760 hours. HbA1C: No results for input(s): HGBA1C in the last 72 hours. CBG: No results for input(s): GLUCAP in the last 168 hours. Lipid Profile: No results for input(s): CHOL, HDL, LDLCALC, TRIG, CHOLHDL, LDLDIRECT in the last 72 hours. Thyroid Function Tests: No results for input(s): TSH, T4TOTAL, FREET4, T3FREE, THYROIDAB in the last 72 hours. Anemia Panel: No results for input(s): VITAMINB12, FOLATE, FERRITIN, TIBC, IRON, RETICCTPCT in the last 72 hours. Sepsis Labs: Recent Labs  Lab 08/02/18 2026 08/02/18 2220  LATICACIDVEN 1.1 1.5    Recent Results (from the past 240 hour(s))  Culture, blood (routine x 2)     Status: None (Preliminary result)   Collection Time: 08/02/18  1:35 PM  Result Value Ref Range Status   Specimen Description BLOOD RIGHT FOREARM DRAWN BY RN  Final   Special Requests   Final    Blood Culture results may not be optimal due to an excessive volume of blood received in culture bottles BOTTLES DRAWN AEROBIC AND ANAEROBIC   Culture   Final    NO GROWTH < 24 HOURS Performed at [facility], [address], [place], [place] [address]    Report Status PENDING  Incomplete  Culture, blood (routine x 2)     Status: None (Preliminary result)   Collection Time: 08/02/18  1:45 PM  Result Value Ref Range Status   Specimen Description BLOOD LEFT FOREARM  Final   Special Requests   Final    Blood Culture results may not be optimal due to an excessive volume of blood received in culture  bottles BOTTLES DRAWN AEROBIC AND ANAEROBIC   Culture   Final    NO GROWTH < 24 HOURS Performed at [facility], [address], [place], [place] [address]    Report Status PENDING  Incomplete  Blood Culture (routine x 2)     Status: None (Preliminary result)   Collection Time: 08/02/18  8:26 PM  Result Value Ref Range Status   Specimen Description RIGHT ANTECUBITAL  Final   Special Requests   Final    BOTTLES DRAWN AEROBIC AND ANAEROBIC Blood Culture adequate volume   Culture   Final    NO GROWTH < 12 HOURS Performed at [facility], [address], [place], [place] [address]    Report Status PENDING  Incomplete  Blood Culture (routine x 2)     Status: None (Preliminary result)   Collection Time: 08/02/18  8:31 PM  Result Value Ref Range Status   Specimen  Description BLOOD RIGHT FOREARM  Final   Special Requests   Final    BOTTLES DRAWN AEROBIC AND ANAEROBIC Blood Culture results may not be optimal due to an excessive volume of blood received in culture bottles   Culture   Final    NO GROWTH < 12 HOURS Performed at [facility], [address], [place], [place] [address]    Report Status PENDING  Incomplete         Radiology Studies: Dg Chest 2 View  Result Date: 08/02/2018 CLINICAL DATA:  Fever, shortness of breath EXAM: CHEST - 2 VIEW COMPARISON:  08/02/2018 FINDINGS: Right Port-A-Cath in place with the tip in the right atrium, stable. Chronic volume loss and opacity throughout the right hemithorax, likely moderate to large right effusion and right lung airspace disease. No confluent opacity on the left. Heart is mildly enlarged. No change since prior study. IMPRESSION: Stable probable moderate to large right effusion and right lung airspace disease. Volume loss on the right. Electronically Signed   By: Rolm Baptise M.D.   On: 08/02/2018 21:35   Dg Chest 2 View  Result Date: 08/02/2018 CLINICAL DATA:  Cough for 6 months slightly worsened; history of muscle partial of the  RIGHT lung, asthma, COPD, hypertension EXAM: CHEST - 2 VIEW COMPARISON:  07/15/2018 FINDINGS: RIGHT jugular Port-A-Cath with tip projecting over RIGHT atrium. Volume loss in the RIGHT hemithorax with mediastinal shift to the RIGHT. Heart size stable. Chronic pleuroparenchymal opacities in the RIGHT hemithorax appear unchanged since the previous exam due to a combination of pleural thickening/effusion and subtotal collapse of the RIGHT lung, likely related to prior treatment for lung cancer. Slightly increased interstitial markings in LEFT lung could represent atypical infiltrate or edema. Tiny pleural effusion blunts the posterior LEFT costophrenic angle. No pneumothorax. Bones demineralized. IMPRESSION: Chronic volume loss and opacity of the RIGHT hemithorax likely related to prior treatment for lung cancer. New interstitial infiltrates LEFT lung question atypical infection versus edema. Tiny LEFT pleural effusion. Electronically Signed   By: Lavonia Dana M.D.   On: 08/02/2018 20:42        Scheduled Meds: . escitalopram  20 mg Oral Daily  . feeding supplement (ENSURE ENLIVE)  237 mL Oral BID BM  . fluconazole  200 mg Oral Daily  . furosemide  40 mg Oral Daily  . gabapentin  600 mg Oral BID  . magic mouthwash  5 mL Oral TID   And  . lidocaine  5 mL Mouth/Throat TID  . methadone  20 mg Oral 5 X Daily  . mirtazapine  15 mg Oral QHS  . pantoprazole  40 mg Oral BID  . [START ON 08/04/2018] pneumococcal 23 valent vaccine  0.5 mL Intramuscular Tomorrow-1000   Continuous Infusions: . ceFEPime (MAXIPIME) IV Stopped (08/03/18 1438)  . vancomycin Stopped (08/03/18 1015)     LOS: 1 day    Time spent: 33mins    Kathie Dike, MD [facility] Pager [phone]  If 7PM-7AM, please contact night-coverage [email or web address] Password [facility] 08/03/2018, 5:07 PM

## 2018-08-03 NOTE — Progress Notes (Signed)
Pharmacy Antibiotic Note  PRADEEP BEAUBRUN is a 57 y.o. male admitted on 08/02/2018 with febrile neutropenia.  Pharmacy has been consulted for Vancomycin dosing.  Plan: Vancomycin 1000 mg IV every 12 hours.  Goal trough 15-20 mcg/mL.  Cefepime 2000 mg IV every 8 hours Monitor labs, c/s, and vanco trough as indicated  Height: 6' (182.9 cm) Weight: 226 lb 3.1 oz (102.6 kg) IBW/kg (Calculated) : 77.6  Temp (24hrs), Avg:98.8 F (37.1 C), Min:97.8 F (36.6 C), Max:100.4 F (38 C)  Recent Labs  Lab 08/01/18 1120 08/02/18 2026 08/02/18 2220 08/03/18 0556  WBC 0.4* 0.2*  --  0.4*  CREATININE  --  0.85  --  0.77  LATICACIDVEN  --  1.1 1.5  --     Estimated Creatinine Clearance: 129.3 mL/min (by C-G formula based on SCr of 0.77 mg/dL).    Allergies  Allergen Reactions  . Demeclocycline Swelling    SWELLING REACTION UNSPECIFIED   . Tetracyclines & Related Swelling    SWELLING REACTION UNSPECIFIED     Antimicrobials this admission: Cefepime 8/28 >>  Vanco 8/28 >>   Dose adjustments this admission: N/A  Microbiology results: 8/28 BCx: pending 8/28 UCx: pending  8/28 MRSA PCR: pending  Thank you for allowing pharmacy to be a part of this patient's care.  Ramond Craver 08/03/2018 7:39 AM

## 2018-08-03 NOTE — Progress Notes (Signed)
Initial Nutrition Assessment  DOCUMENTATION CODES:   Obesity unspecified  INTERVENTION:  Ensure Enlive po BID, each supplement provides 350 kcal and 20 grams of protein   Food preferences obtained through nutrition services   Recommend 5-6 small meals daily  NUTRITION DIAGNOSIS:   Increased nutrient needs related to cancer and cancer related treatments as evidenced by estimated needs.   GOAL:  Patient will meet greater than or equal to 90% of their needs(- if feasible )    MONITOR:   PO intake, Supplement acceptance(--mouth pain control)  REASON FOR ASSESSMENT:   Malnutrition Screening Tool    ASSESSMENT:  Patient has multiple hospitalizations during the past 6 months. He presents yesterday with febrile neutropenia. He is complaining about mouth pain (thrush to soft palate per NP). Patient has stage IV lung cancer and has been taking chemotherapy- last treatment 8/20 (see NP-Hematology note). Hemoglobin 5.4 - PRBC's 2 units ordered.    Patient at risk for weight loss due to his painful thrush. Talked with his nurse who says he has been drinking mostly Pepsi today and family member brought a milkshake from [place] which he consumed. At home he drinks Boost and sometimes Ensure to supplement nutrition. Patient has at least 6 regular Pepsi's from home on his table. Talked with him about avoiding spicy and choose cold, soft foods such as mashed potatoes, pudding, ice cream  Yogurt eggs and so on while his mouth is sore.   Patient usual weight reported 215-220 lb -current wt at 225.7 lb. He has moderate temporal and acromion region/ deltoid  muscle loss.   Labs: BMP Latest Ref Rng & Units 08/03/2018 08/02/2018 07/25/2018  Glucose 70 - 99 mg/dL 84 140(H) 130(H)  BUN 6 - 20 mg/dL 7 8 7   Creatinine 0.61 - 1.24 mg/dL 0.77 0.85 1.09  Sodium 135 - 145 mmol/L 137 134(L) 136  Potassium 3.5 - 5.1 mmol/L 3.0(L) 2.7(LL) 3.2(L)  Chloride 98 - 111 mmol/L 99 95(L) 99  CO2 22 - 32 mmol/L  34(H) 33(H) 30  Calcium 8.9 - 10.3 mg/dL 7.7(L) 7.6(L) 7.8(L)    Medications reviewed and include:  Ensure Enlive,   NUTRITION - FOCUSED PHYSICAL EXAM:    Diet Order:   Diet Order            Diet Heart Room service appropriate? Yes; Fluid consistency: Thin  Diet effective now              EDUCATION NEEDS:  Education needs have been addressed   Skin:  Skin Assessment: Reviewed RN Assessment  Last BM:  8/28 type 7  Height:   Ht Readings from Last 1 Encounters:  08/03/18 6' (1.829 m)    Weight:   Wt Readings from Last 1 Encounters:  08/03/18 102.6 kg    Ideal Body Weight:  81 kg  BMI:  Body mass index is 30.68 kg/m.  Estimated Nutritional Needs:   Kcal:  4496-7591 (22-24 kcal/kg bw)  Protein:  121-138 gr   (1.5-1.7 gr/kg ibw)  Fluid:  >2 liters daily  Colman Cater MS,RD,CSG,LDN Office: [phone] Pager: [phone]

## 2018-08-04 ENCOUNTER — Other Ambulatory Visit (HOSPITAL_COMMUNITY): Payer: Self-pay | Admitting: Nurse Practitioner

## 2018-08-04 DIAGNOSIS — C3491 Malignant neoplasm of unspecified part of right bronchus or lung: Secondary | ICD-10-CM

## 2018-08-04 DIAGNOSIS — R5081 Fever presenting with conditions classified elsewhere: Secondary | ICD-10-CM

## 2018-08-04 DIAGNOSIS — D61818 Other pancytopenia: Secondary | ICD-10-CM

## 2018-08-04 DIAGNOSIS — Z881 Allergy status to other antibiotic agents status: Secondary | ICD-10-CM

## 2018-08-04 DIAGNOSIS — Z7189 Other specified counseling: Secondary | ICD-10-CM

## 2018-08-04 DIAGNOSIS — D649 Anemia, unspecified: Secondary | ICD-10-CM

## 2018-08-04 DIAGNOSIS — D709 Neutropenia, unspecified: Principal | ICD-10-CM

## 2018-08-04 DIAGNOSIS — Z515 Encounter for palliative care: Secondary | ICD-10-CM

## 2018-08-04 DIAGNOSIS — F17211 Nicotine dependence, cigarettes, in remission: Secondary | ICD-10-CM

## 2018-08-04 LAB — BPAM RBC
BLOOD PRODUCT EXPIRATION DATE: 201910022359
Blood Product Expiration Date: 201910022359
Blood Product Expiration Date: 201910042359
ISSUE DATE / TIME: 201908290035
ISSUE DATE / TIME: 201908290959
ISSUE DATE / TIME: 201908291356
Unit Type and Rh: 9500
Unit Type and Rh: 9500
Unit Type and Rh: 9500

## 2018-08-04 LAB — COMPREHENSIVE METABOLIC PANEL
ALBUMIN: 1.9 g/dL — AB (ref 3.5–5.0)
ALK PHOS: 157 U/L — AB (ref 38–126)
ALT: 28 U/L (ref 0–44)
AST: 35 U/L (ref 15–41)
Anion gap: 6 (ref 5–15)
BILIRUBIN TOTAL: 1.3 mg/dL — AB (ref 0.3–1.2)
BUN: 8 mg/dL (ref 6–20)
CALCIUM: 8 mg/dL — AB (ref 8.9–10.3)
CO2: 33 mmol/L — ABNORMAL HIGH (ref 22–32)
CREATININE: 0.81 mg/dL (ref 0.61–1.24)
Chloride: 100 mmol/L (ref 98–111)
GFR calc Af Amer: >60 mL/min (ref >60)
GLUCOSE: 83 mg/dL (ref 70–99)
POTASSIUM: 3.4 mmol/L — AB (ref 3.5–5.1)
Sodium: 139 mmol/L (ref 135–145)
TOTAL PROTEIN: 7.4 g/dL (ref 6.5–8.1)

## 2018-08-04 LAB — CBC WITH DIFFERENTIAL/PLATELET
BASOS ABS: 0 10*3/uL (ref 0.0–0.1)
Basophils Relative: 3 %
EOS ABS: 0 10*3/uL (ref 0.0–0.7)
EOS PCT: 0 %
HCT: 25 % — ABNORMAL LOW (ref 39.0–52.0)
Hemoglobin: 7.7 g/dL — ABNORMAL LOW (ref 13.0–17.0)
LYMPHS PCT: 79 %
Lymphs Abs: 0.3 10*3/uL (ref 0.7–4.0)
MCH: 29.2 pg (ref 26.0–34.0)
MCHC: 30.8 g/dL (ref 30.0–36.0)
MCV: 94.7 fL (ref 78.0–100.0)
MONO ABS: 0 10*3/uL (ref 0.1–1.0)
Monocytes Relative: 5 %
Neutro Abs: 0.1 10*3/uL (ref 1.7–7.7)
Neutrophils Relative %: 13 %
PLATELETS: 40 10*3/uL — AB (ref 150–400)
RBC: 2.64 MIL/uL — ABNORMAL LOW (ref 4.22–5.81)
RDW: 16.6 % — ABNORMAL HIGH (ref 11.5–15.5)
WBC: 0.4 10*3/uL — CL (ref 4.0–10.5)

## 2018-08-04 LAB — TYPE AND SCREEN
ABO/RH(D): O NEG
ANTIBODY SCREEN: NEGATIVE
UNIT DIVISION: 0
UNIT DIVISION: 0
Unit division: 0

## 2018-08-04 LAB — URINE CULTURE: Culture: <10000 — AB

## 2018-08-04 MED ORDER — LEVALBUTEROL HCL 0.63 MG/3ML IN NEBU
0.6300 mg | INHALATION_SOLUTION | Freq: Four times a day (QID) | RESPIRATORY_TRACT | Status: DC
  Administered 2018-08-04 – 2018-08-05 (×4): 0.63 mg via RESPIRATORY_TRACT
  Filled 2018-08-04 (×5): qty 3

## 2018-08-04 MED ORDER — LEVALBUTEROL HCL 0.63 MG/3ML IN NEBU
0.6300 mg | INHALATION_SOLUTION | Freq: Three times a day (TID) | RESPIRATORY_TRACT | Status: DC | PRN
  Administered 2018-08-05: 0.63 mg via RESPIRATORY_TRACT
  Filled 2018-08-04: qty 3

## 2018-08-04 MED ORDER — POTASSIUM CHLORIDE CRYS ER 20 MEQ PO TBCR: 40.0000 meq | EXTENDED_RELEASE_TABLET | Freq: Once | ORAL | Status: DC

## 2018-08-04 MED ORDER — IPRATROPIUM BROMIDE 0.02 % IN SOLN
0.5000 mg | Freq: Four times a day (QID) | RESPIRATORY_TRACT | Status: DC
  Administered 2018-08-04 – 2018-08-05 (×4): 0.5 mg via RESPIRATORY_TRACT
  Filled 2018-08-04 (×5): qty 2.5

## 2018-08-04 MED ORDER — AMOXICILLIN-POT CLAVULANATE 875-125 MG PO TABS
1.0000 | ORAL_TABLET | Freq: Two times a day (BID) | ORAL | Status: DC
  Administered 2018-08-04 – 2018-08-05 (×2): 1 via ORAL
  Filled 2018-08-04 (×2): qty 1

## 2018-08-04 NOTE — Care Management (Signed)
Potential DC home over weekend. [facility] rep, aware. DC home with resumption of [place] services. Pt aware HH has 48 hrs to make resumption visit.

## 2018-08-04 NOTE — Care Management (Signed)
Patient Information   SS# [id number]  Patient Name Jacorey, Donaway ([id number]) Sex Male DOB 10/15/1961  Room Bed  IC11 IC11-01  Patient Demographics   Address [place] [address] Phone [phone] [facility]) [phone] (Mobile) *Preferred* E-mail Address [email or web address]  Patient Ethnicity & Race   Ethnic Group Patient Race  Not Hispanic or Latino White or Caucasian  Emergency Contact(s)   Name Relation Home Work [place] Sister [phone]  [place] [phone]  [phone]  Documents on File    Status Date Received Description  Documents for the Patient  EMR Medication Summary Not Received    EMR Problem Summary Not Received    EMR Patient Summary Not Received    Release of Information Not Received    [place] Received 04/11/11   [place] E-Signature HIPAA Notice of Privacy Received 03/21/12   [place] E-Signature HIPAA Notice of Privacy [place] Not Received    Driver's License Not Received   drivers licence/ss/aped  Insurance Card Not Received  medicare/ss/aped  Advance Directives/Living Will/HCPOA/POA Not Received    [place] Not Received    Financial Application Not Received    HIM ROI Authorization Not Received    Release of Information Not Received    HIM ROI Authorization  07/11/13   Release of Information  07/12/13   HIM ROI Authorization (Expired) 08/01/13 Need [place] records from 12/06/2008 to present.  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  Other Photo ID Not Received    Insurance Card Received 12/10/16 medicaid  Insurance Card Received 12/10/16 MEDICARE  AMB Correspondence  07/05/16 REFERRING NOTE/ DR,[facility]INN[facility]  AMB Correspondence  07/02/16 OFFFICE NOTE/ DR [facility]  AMB Correspondence  06/21/16 OFFICE  [facility]  AMB Correspondence  08/13/16 5/17-7/17 OFFICE [facility]  AMB Correspondence  07/27/16 NTS SOAP NOTE/ [facility]  AMB Correspondence  07/27/16 OFFICE NOTE [facility]  AMB Correspondence  10/04/16 LETTER SILVERSCRIPT  Release of Information Received 12/10/16 R[facility]  HIM ROI Authorization  12/10/16   Insurance Card Received 01/13/17   HIM ROI Authorization  02/24/17   AMB Correspondence  10/27/16 FOLLOWUP NOTE/ [facility]  AMB Correspondence  10/20/16 ENDOFTREATMENTNOTE/[facility].  HIM ROI Authorization  03/08/17   Consent Form   thoracentesis  HIM ROI Authorization (Expired) 10/07/17 Jenne Pane. [place]  AMB Correspondence Received 01/06/18 LETTER SILVERSCRIPT  AMB Outside Hospital Record Received 03/02/18 03/02/18 OFFICE [place] HIPAA NOTICE OF PRIVACY - Scanned Received 04/18/18 [facility]  Insurance Card Received 04/18/18 [place]  AMB HH/NH/Hospice Received 32/20/25 CERTIFICATION/POC [facility]  Consent Form     AMB HH/NH/Hospice Received 06/21/18 06/19-07/19 ORDERS [facility]  AMB HH/NH/Hospice Received 05/23/18 ORDERS [facility]  AMB HH/NH/Hospice Received 06/07/18 ORDERS [facility]  Patient Photo   Photo of Patient  HIM Release of Information Output (Deleted) 10/12/17 AMB Encounter Progress Notes and Labs, Flowsheets, Pathology Report, Orders/Results Individual  HIM Release of Information Output (Deleted) 07/12/18 Requested records  HIM Release of Information Output (Deleted) 07/12/18 Requested records  HIM Release of Information Output (Deleted) 07/12/18 Requested records  Documents for the Encounter  AOB (Assignment of Insurance Benefits) Not Received    E-signature AOB Signed 08/02/18   MEDICARE RIGHTS Not Received    E-signature Medicare Rights Signed  08/02/18   Cardiac Monitoring Strip Shift Summary Received 08/02/18    Blood Consent Signed 08/02/18   ED Patient Billing Extract   ED PB Billing Extract  EKG Received 08/03/18   Admission Information   Attending Provider Admitting Provider Admission Type Admission Date/Time  Kathie Dike, MD Reubin Milan, MD Emergency 08/02/18 1938  Discharge Date Hospital Service Auth/Cert Status Service Area   Internal Medicine Incomplete [facility]  Unit Room/Bed Admission Status   AP-ICCUP NURSING IC11/IC11-01 Admission (Confirmed)   Admission   Complaint  .  Hospital Account   Name Acct ID Class Status Primary Coverage  Revin, [place] [id number] Inpatient Open MEDICARE - MEDICARE PART A AND B      Guarantor Account (for Hospital Account [id number])   Name Relation to Pt Service Area Active? Acct Type  Maylene Roes Yes Personal/Family  Address Phone    [address] [place], [place] [address] [phone])        Coverage Information (for Hospital Account [id number])   1. MEDICARE/MEDICARE PART A AND B   F/O Payor/Plan Precert #  MEDICARE/MEDICARE PART A AND B   Subscriber Subscriber #  Ebubechukwu, [place] [id number]  Address Phone  [address] [place], [place] [address]   2. MEDICAID [place]/MEDICAID [place]   F/O Payor/Plan Precert #  MEDICAID [place]/MEDICAID OF [place]   Subscriber Subscriber #  Britten, Seyfried [id number]  Address Phone  [address] [place], [place] [address] [phone]

## 2018-08-04 NOTE — Care Management Important Message (Signed)
Important Message  Patient Details  Name: Erik [name] MRN: [id number] Date of Birth: 1961/02/23   Medicare Important Message Given:  Yes    Shelda Altes 08/04/2018, 12:36 PM

## 2018-08-04 NOTE — Progress Notes (Signed)
Lab called critical WBC at 0.4 0510. Value consistent with previous.

## 2018-08-04 NOTE — Progress Notes (Signed)
Palliative:  [name], [name], is resting quietly in bed.  He greets me making and mostly keeping eye contact.  He tells me that every time he has chemotherapy he becomes ill.  He shares that his goal is to return home, spend time with family and friends.  Been shares that he understands his cancer treatment is palliative, there is no [name] for his cancer.  He tells me that he has spoken with oncologist who tells him that even without treatment he may still have 6 to 12 months. We talked about in-home hospice services.  His choice is hospice of [facility] for treat the treatable in-home hospice care. He was seen by PMT been shared at that time that he did not want to continue chemotherapy unless his symptoms could be managed.  He tells me today that he has continued chemotherapy, but has been sick after every session.  He shares that he is tired of feeling bad, he would rather enjoy the time he has left.  I consider that, as in the past, when he recovers from this illness, he may decide to again seek cancer treatment.  Due to his relatively young age of 36, it may be difficult for him to accept full comfort care stick with the thought of no further chemotherapy.   We did talk about repeat hospitalizations in the future.  I share there may come a time when he decides he does not want to come back to the hospital for any illness.  If that occurs, hospice would make sure to focus on comfort and dignity. Conference with hospitalist related to plan of care, hospice referral. Conference with case management related to plan of care, hospice referral. 1 minutes Erik [name] [place], NP Palliative Medicine Team Team Phone # [phone]  Greater than 50% of this time was spent counseling and coordinating care related to the above assessment and plan.

## 2018-08-04 NOTE — Care Management (Addendum)
Referral made to [facility] per pt's choice. Pt will DC home next 24 hrs. Pt aware hospice will not admit pt over holiday weekend. We will cont DC with HH and pt may terminate services once hospice in place.

## 2018-08-04 NOTE — Consult Note (Signed)
[facility] Consultation Oncology  Name: Erik [name]      MRN: [id number]    Location: IC11/IC11-01  Date: 08/04/2018 Time:1:48 PM   REFERRING PHYSICIAN: Dr. Roderic Palau  REASON FOR CONSULT: Metastatic lung cancer.   DIAGNOSIS: Neutropenic fever with cytopenias.  HISTORY OF PRESENT ILLNESS: Erik [name] is a 57 year old pleasant white male who is seen in consultation today for further management of stage IV adenocarcinoma the right lung.  He received second cycle of chemotherapy which was dose reduced on 07/25/2018.  His routine lab work in our office on 08/01/2018 showed severe anemia.  On 08/02/2018 higher to his second blood transfusion, he had a fever of 100.5.  We did work-up for fever in our office including blood cultures and a chest x-ray.  He was given IV antibiotics.  He was reluctant to get admitted to the hospital at that time.  He was sent home on p.o. antibiotics.  He ended up having fever again that night and came to the ER.  He was admitted for IV antibiotics for neutropenic fever.  Since admission, he is receiving vancomycin and cefepime.  He is also receiving Diflucan for oral thrush.  Today he feels better.  His breathing is stable.  He wants to go home as soon as possible.  Denies any headaches, nausea or vomiting.  Denies any bleeding issues.  Baseline cough is stable.  PAST MEDICAL HISTORY:   Past Medical History:  Diagnosis Date  . 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 Arthritis   . Asthma   . Chronic back pain   . COPD (chronic obstructive pulmonary disease) ([place])   . Depression   . Dyspnea    with exertion   . GERD (gastroesophageal reflux disease)   . Hypertension   . Pneumonia   . Pulmonary embolus ([place])   . Seasonal allergies   . Spinal stenosis of lumbar region   . 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 Wheezing     ALLERGIES: Allergies  Allergen Reactions  . Demeclocycline Swelling    SWELLING REACTION UNSPECIFIED   . Tetracyclines &  Related Swelling    SWELLING REACTION UNSPECIFIED       MEDICATIONS: I have reviewed the patient's current medications.     PAST SURGICAL HISTORY Past Surgical History:  Procedure Laterality Date  . APPLICATION OF WOUND VAC  04/19/2018   Procedure: APPLICATION OF WOUND VAC;  Surgeon: [name], MD;  Location: [place];  Service: Vascular;;  . APPLICATION OF WOUND VAC Right 04/21/2018   Procedure: WOUND VAC CHANGE;  Surgeon: [name], MD;  Location: [place];  Service: Thoracic;  Laterality: Right;  . APPLICATION OF WOUND VAC N/A 04/24/2018   Procedure: WOUND VAC CHANGE;  Surgeon: [name], MD;  Location: [place];  Service: Thoracic;  Laterality: N/A;  . BACK SURGERY     5 total  . BIOPSY  01/03/2017   Procedure: BIOPSY;  Surgeon: Daneil Dolin, MD;  [facility];  Service: Endoscopy;;  gastric  . CHEST TUBE INSERTION Right 04/19/2018   Procedure: CHEST TUBE INSERTION;  Surgeon: [name], MD;  Location: [place];  Service: Vascular;  Laterality: Right;  . CHEST TUBE INSERTION Right 04/19/2018   Procedure: CHEST TUBE INSERTION;  Surgeon: [name], MD;  Location: [place];  Service: Thoracic;  Laterality: Right;  . COLONOSCOPY WITH PROPOFOL N/A 04/04/2017   Procedure: COLONOSCOPY WITH PROPOFOL;  Surgeon: Daneil Dolin,  MD;  [facility];  Service: Endoscopy;  Laterality: N/A;  8:45am  . DECORTICATION Right 03/28/2018   Procedure: DECORTICATION;  Surgeon: [name], MD;  Location: [facility];  Service: Thoracic;  Laterality: Right;  . ESOPHAGOGASTRODUODENOSCOPY (EGD) WITH PROPOFOL N/A 01/03/2017   Procedure: ESOPHAGOGASTRODUODENOSCOPY (EGD) WITH PROPOFOL;  Surgeon: Daneil Dolin, MD;  [facility];  Service: Endoscopy;  Laterality: N/A;  . I&D EXTREMITY Right 04/19/2018   Procedure: IRRIGATION AND DEBRIDEMENT RIGHT CHEST WOUND;  Surgeon: [name], MD;  Location: [place];  Service: Vascular;   Laterality: Right;  . MALONEY DILATION N/A 01/03/2017   Procedure: Venia Minks DILATION;  Surgeon: Daneil Dolin, MD;  [facility];  Service: Endoscopy;  Laterality: N/A;  . MULTIPLE EXTRACTIONS WITH ALVEOLOPLASTY N/A 07/08/2014   Procedure: MULTIPLE EXTRACION WITH ALVEOLOPLASTY with EXCISION LESION RIGHT SIDE OF TONGUE;  Surgeon: Gae Bon, DDS;  Location: [place];  Service: Oral Surgery;  Laterality: N/A;  . PORTACATH PLACEMENT Right 07/30/2016   Procedure: INSERTION PORT-A-CATH;  Surgeon: Vickie Epley, MD;  Location: [facility];  Service: Vascular;  Laterality: Right;  . THORACOTOMY  04/19/2018   Procedure: THORACOTOMY MAJOR;  Surgeon: [name], MD;  Location: [place];  Service: Vascular;;  . VIDEO ASSISTED THORACOSCOPY Right 04/21/2018   Procedure: VIDEO ASSISTED THORACOSCOPY;  Surgeon: [name], MD;  Location: [place];  Service: Thoracic;  Laterality: Right;  [name] VIDEO ASSISTED THORACOSCOPY Right 04/24/2018   Procedure: VIDEO ASSISTED THORACOSCOPY;  Surgeon: [name], MD;  Location: [place];  Service: Thoracic;  Laterality: Right;  [name] VIDEO ASSISTED THORACOSCOPY (VATS)/EMPYEMA Right 03/28/2018   Procedure: VIDEO ASSISTED THORACOSCOPY (VATS)/EMPYEMA;  Surgeon: [name], MD;  Location: [place];  Service: Thoracic;  Laterality: Right;  [name] VIDEO BRONCHOSCOPY WITH ENDOBRONCHIAL ULTRASOUND N/A 07/19/2016   Procedure: VIDEO BRONCHOSCOPY WITH ENDOBRONCHIAL ULTRASOUND;  Surgeon: [name], MD;  Location: [place];  Service: Thoracic;  Laterality: N/A;    FAMILY HISTORY: Family History  Problem Relation Age of Onset  . Cancer Mother        breast cancer  . Heart failure Brother   . Colon cancer Neg Hx        not sure, ?grandfather and/or uncle    SOCIAL HISTORY:  reports that he quit smoking about 5 months ago. His smoking use included cigarettes. He has a 9.50 pack-year smoking history. He has quit using smokeless tobacco.  His smokeless  tobacco use included chew. He reports that he drinks about 2.0 standard drinks of alcohol per week. He reports that he has current or past drug history. Drug: Marijuana. Frequency: 7.00 times per week.  PERFORMANCE STATUS: The patient's performance status is 2 - Symptomatic, <50% confined to bed  PHYSICAL EXAM: Most Recent Vital Signs: Blood pressure 124/89, pulse (!) 116, temperature 98.3 F (36.8 C), temperature source Oral, resp. rate 17, height 6' (1.829 m), weight 226 lb 3.1 oz (102.6 kg), SpO2 98 %. BP 124/89   Pulse (!) 116   Temp 98.3 F (36.8 C) (Oral)   Resp 17   Ht 6' (1.829 m)   Wt 226 lb 3.1 oz (102.6 kg)   SpO2 98%   BMI 30.68 kg/m  General appearance: alert and cooperative Head: Normocephalic, without obvious abnormality, atraumatic Eyes: conjunctivae/corneas clear. PERRL, EOM's intact. Fundi benign. Throat: He has thrush on his soft palate. Lungs: Decreased breath sounds on the right side.  Normal breath sites on  the left. Heart: regularly irregular rhythm Abdomen: soft, non-tender; bowel sounds normal; no masses,  no organomegaly Extremities: edema 2+ bilaterally. Skin: Skin color, texture, turgor normal. No rashes or lesions Neurologic: Alert and oriented X 3, normal strength and tone. Normal symmetric reflexes. Normal coordination and gait  LABORATORY DATA:  Results for orders placed or performed during the hospital encounter of 08/02/18 (from the past 48 hour(s))  Comprehensive metabolic panel     Status: Abnormal   Collection Time: 08/02/18  8:26 PM  Result Value Ref Range   Sodium 134 (L) 135 - 145 mmol/L   Potassium 2.7 (LL) 3.5 - 5.1 mmol/L    Comment: CRITICAL RESULT CALLED TO, READ BACK BY AND VERIFIED WITH: LASHLEY,S AT 2143 ON 8.28.2019 BY ISLEY,B    Chloride 95 (L) 98 - 111 mmol/L   CO2 33 (H) 22 - 32 mmol/L   Glucose, Bld 140 (H) 70 - 99 mg/dL   BUN 8 6 - 20 mg/dL   Creatinine, Ser 0.85 0.61 - 1.24 mg/dL   Calcium 7.6 (L) 8.9 - 10.3 mg/dL    Total Protein 7.9 6.5 - 8.1 g/dL   Albumin 2.0 (L) 3.5 - 5.0 g/dL   AST 48 (H) 15 - 41 U/L   ALT 33 0 - 44 U/L   Alkaline Phosphatase 176 (H) 38 - 126 U/L   Total Bilirubin 1.1 0.3 - 1.2 mg/dL   GFR calc non Af Amer >60 >60 mL/min   GFR calc Af Amer >60 >60 mL/min    Comment: (NOTE) The eGFR has been calculated using the CKD EPI equation. This calculation has not been validated in all clinical situations. eGFR's persistently <60 mL/min signify possible Chronic Kidney Disease.    Anion gap 6 5 - 15    Comment: Performed at [facility], [address], [place], [place] [address]  CBC WITH DIFFERENTIAL     Status: Abnormal   Collection Time: 08/02/18  8:26 PM  Result Value Ref Range   WBC 0.2 (LL) 4.0 - 10.5 K/uL    Comment: SMEAR STAINED AND AVAILABLE FOR REVIEW RESULT REPEATED AND VERIFIED CRITICAL RESULT CALLED TO, READ BACK BY AND VERIFIED WITH: NORMAN,B @ 2124 ON 08/02/18 BY JUW    RBC 2.09 (L) 4.22 - 5.81 MIL/uL   Hemoglobin 6.1 (LL) 13.0 - 17.0 g/dL    Comment: RESULT REPEATED AND VERIFIED CRITICAL RESULT CALLED TO, READ BACK BY AND VERIFIED WITH: NORMAN,B @ 2124 ON 08/02/18 BY JUW    HCT 19.5 (L) 39.0 - 52.0 %   MCV 93.3 78.0 - 100.0 fL   MCH 29.2 26.0 - 34.0 pg   MCHC 31.3 30.0 - 36.0 g/dL   RDW 17.2 (H) 11.5 - 15.5 %   Platelets 81 (L) 150 - 400 K/uL    Comment: SPECIMEN CHECKED FOR CLOTS   Neutrophils Relative % 0 %   Neutro Abs 0.0 1.7 - 7.7 K/uL   Lymphocytes Relative 90 %   Lymphs Abs 0.2 0.7 - 4.0 K/uL   Monocytes Relative 5 %   Monocytes Absolute 0.0 0.1 - 1.0 K/uL   Eosinophils Relative 0 %   Eosinophils Absolute 0.0 0.0 - 0.7 K/uL   Basophils Relative 5 %   Basophils Absolute 0.0 0.0 - 0.1 K/uL    Comment: Performed at [facility], [address], [place],  [address]  Blood Culture (routine x 2)     Status: None (Preliminary result)   Collection Time: 08/02/18  8:26 PM  Result  Value Ref Range   Specimen Description RIGHT ANTECUBITAL    Special  Requests      BOTTLES DRAWN AEROBIC AND ANAEROBIC Blood Culture adequate volume   Culture      NO GROWTH 2 DAYS Performed at [facility], [address], [place], [place] [address]    Report Status PENDING   Troponin I     Status: None   Collection Time: 08/02/18  8:26 PM  Result Value Ref Range   Troponin I <0.03 <0.03 ng/mL    Comment: Performed at [facility], [address], [place], [place] [address]  Lactic acid, plasma     Status: None   Collection Time: 08/02/18  8:26 PM  Result Value Ref Range   Lactic Acid, Venous 1.1 0.5 - 1.9 mmol/L    Comment: Performed at [facility], [address] [place], [place] [address]  Protime-INR     Status: Abnormal   Collection Time: 08/02/18  8:26 PM  Result Value Ref Range   Prothrombin Time 20.6 (H) 11.4 - 15.2 seconds   INR 1.78     Comment: Performed at [facility], [address] [place], [place] [address]  Magnesium     Status: None   Collection Time: 08/02/18  8:26 PM  Result Value Ref Range   Magnesium 1.7 1.7 - 2.4 mg/dL    Comment: Performed at [facility], [address] [place], [place] [address]  Brain natriuretic peptide     Status: None   Collection Time: 08/02/18  8:27 PM  Result Value Ref Range   B Natriuretic Peptide 84.0 0.0 - 100.0 pg/mL    Comment: Performed at [facility], [address] [place], [place] [address]  Blood Culture (routine x 2)     Status: None (Preliminary result)   Collection Time: 08/02/18  8:31 PM  Result Value Ref Range   Specimen Description BLOOD RIGHT FOREARM    Special Requests      BOTTLES DRAWN AEROBIC AND ANAEROBIC Blood Culture results may not be optimal due to an excessive volume of blood received in culture bottles   Culture      NO GROWTH 2 DAYS Performed at [facility], [address], [place], [place] [address]    Report Status PENDING   Lactic acid, plasma     Status: None   Collection Time: 08/02/18 10:20 PM  Result Value Ref Range   Lactic Acid, Venous 1.5 0.5 - 1.9  mmol/L    Comment: Performed at [facility], [name], [place], [place] [address]  Type and screen     Status: None   Collection Time: 08/02/18 10:20 PM  Result Value Ref Range   ABO/RH(D) O NEG    Antibody Screen NEG    Sample Expiration 08/05/2018    Unit Number [id number]    Blood Component Type RBC LR PHER1    Unit division 00    Status of Unit ISSUED,FINAL    Transfusion Status OK TO TRANSFUSE    Crossmatch Result Compatible    Unit Number [id number]    Blood Component Type RBC LR PHER2    Unit division 00    Status of Unit ISSUED,FINAL    Transfusion Status OK TO TRANSFUSE    Crossmatch Result      Compatible Performed at [facility], [address], [place],  [address]    Unit Number [id number]    Blood Component Type RBC LR PHER2    Unit division 00  Status of Unit ISSUED,FINAL    Transfusion Status OK TO TRANSFUSE    Crossmatch Result Compatible   Prepare RBC     Status: None   Collection Time: 08/02/18 10:20 PM  Result Value Ref Range   Order Confirmation      ORDER PROCESSED BY BLOOD BANK Performed at [facility], [address], [place], [place] [address]   Prepare RBC     Status: None   Collection Time: 08/02/18 10:20 PM  Result Value Ref Range   Order Confirmation      ORDER PROCESSED BY BLOOD BANK Performed at [facility], [address] [place], [place] [address]   Urine culture     Status: Abnormal   Collection Time: 08/02/18 10:52 PM  Result Value Ref Range   Specimen Description      URINE, CLEAN CATCH Performed at [facility], [address], [place], [place] [address]    Special Requests      NONE Performed at [facility], [address], [place], [place] [address]    Culture (A)     <10,000 COLONIES/mL INSIGNIFICANT GROWTH Performed at [facility] [address], [place], [place] [address]    Report Status 08/04/2018 FINAL   Urinalysis, Routine w reflex microscopic     Status: Abnormal   Collection  Time: 08/02/18 10:53 PM  Result Value Ref Range   Color, Urine YELLOW YELLOW   APPearance CLEAR CLEAR   Specific Gravity, Urine 1.004 (L) 1.005 - 1.030   pH 7.0 5.0 - 8.0   Glucose, UA NEGATIVE NEGATIVE mg/dL   Hgb urine dipstick SMALL (A) NEGATIVE   Bilirubin Urine NEGATIVE NEGATIVE   Ketones, ur NEGATIVE NEGATIVE mg/dL   Protein, ur NEGATIVE NEGATIVE mg/dL   Nitrite NEGATIVE NEGATIVE   Leukocytes, UA NEGATIVE NEGATIVE   RBC / HPF 0-5 0 - 5 RBC/hpf   WBC, UA 0-5 0 - 5 WBC/hpf   Bacteria, UA NONE SEEN NONE SEEN    Comment: Performed at [facility], [address] [place], [place] [address]  Comprehensive metabolic panel     Status: Abnormal   Collection Time: 08/03/18  5:56 AM  Result Value Ref Range   Sodium 137 135 - 145 mmol/L   Potassium 3.0 (L) 3.5 - 5.1 mmol/L   Chloride 99 98 - 111 mmol/L   CO2 34 (H) 22 - 32 mmol/L   Glucose, Bld 84 70 - 99 mg/dL   BUN 7 6 - 20 mg/dL   Creatinine, Ser 0.77 0.61 - 1.24 mg/dL   Calcium 7.7 (L) 8.9 - 10.3 mg/dL   Total Protein 7.4 6.5 - 8.1 g/dL   Albumin 1.9 (L) 3.5 - 5.0 g/dL   AST 39 15 - 41 U/L   ALT 31 0 - 44 U/L   Alkaline Phosphatase 160 (H) 38 - 126 U/L   Total Bilirubin 1.4 (H) 0.3 - 1.2 mg/dL   GFR calc non Af Amer >60 >60 mL/min   GFR calc Af Amer >60 >60 mL/min    Comment: (NOTE) The eGFR has been calculated using the CKD EPI equation. This calculation has not been validated in all clinical situations. eGFR's persistently <60 mL/min signify possible Chronic Kidney Disease.    Anion gap 4 (L) 5 - 15    Comment: Performed at [facility], [address] [place], [place] [address]  CBC WITH DIFFERENTIAL     Status: Abnormal   Collection Time: 08/03/18  5:56 AM  Result Value Ref  Range   WBC 0.4 (LL) 4.0 - 10.5 K/uL    Comment: RESULT REPEATED AND VERIFIED CRITICAL RESULT CALLED TO, READ BACK BY AND VERIFIED WITH: DANIELS,J AT 0630 BY HUFFINES,S ON 08/03/18. WHITE COUNT CONFIRMED ON SMEAR    RBC 2.26 (L) 4.22 - 5.81  MIL/uL   Hemoglobin 6.5 (LL) 13.0 - 17.0 g/dL    Comment: RESULT REPEATED AND VERIFIED CRITICAL RESULT CALLED TO, READ BACK BY AND VERIFIED WITH: DANIELS,J AT 0630 BY HUFFINES,S ON 08/03/18.    HCT 20.9 (L) 39.0 - 52.0 %   MCV 92.5 78.0 - 100.0 fL   MCH 28.8 26.0 - 34.0 pg   MCHC 31.1 30.0 - 36.0 g/dL   RDW 17.2 (H) 11.5 - 15.5 %   Platelets 61 (L) 150 - 400 K/uL    Comment: SPECIMEN CHECKED FOR CLOTS PLATELET COUNT CONFIRMED BY SMEAR    Neutrophils Relative % 5 %   Lymphocytes Relative 89 %   Monocytes Relative 3 %   Eosinophils Relative 0 %   Basophils Relative 3 %   Neutro Abs 0.0 (L) 1.7 - 7.7 K/uL   Lymphs Abs 0.4 (L) 0.7 - 4.0 K/uL   Monocytes Absolute 0.0 (L) 0.1 - 1.0 K/uL   Eosinophils Absolute 0.0 0.0 - 0.7 K/uL   Basophils Absolute 0.0 0.0 - 0.1 K/uL   RBC Morphology STOMATOCYTES     Comment: Performed at [facility], [address], [place], [place] [address]  Comprehensive metabolic panel     Status: Abnormal   Collection Time: 08/04/18  4:14 AM  Result Value Ref Range   Sodium 139 135 - 145 mmol/L   Potassium 3.4 (L) 3.5 - 5.1 mmol/L   Chloride 100 98 - 111 mmol/L   CO2 33 (H) 22 - 32 mmol/L   Glucose, Bld 83 70 - 99 mg/dL   BUN 8 6 - 20 mg/dL   Creatinine, Ser 0.81 0.61 - 1.24 mg/dL   Calcium 8.0 (L) 8.9 - 10.3 mg/dL   Total Protein 7.4 6.5 - 8.1 g/dL   Albumin 1.9 (L) 3.5 - 5.0 g/dL   AST 35 15 - 41 U/L   ALT 28 0 - 44 U/L   Alkaline Phosphatase 157 (H) 38 - 126 U/L   Total Bilirubin 1.3 (H) 0.3 - 1.2 mg/dL   GFR calc non Af Amer >60 >60 mL/min   GFR calc Af Amer >60 >60 mL/min    Comment: (NOTE) The eGFR has been calculated using the CKD EPI equation. This calculation has not been validated in all clinical situations. eGFR's persistently <60 mL/min signify possible Chronic Kidney Disease.    Anion gap 6 5 - 15    Comment: Performed at [facility], [address] [place], [place] [address]  CBC WITH DIFFERENTIAL     Status: Abnormal   Collection  Time: 08/04/18  4:14 AM  Result Value Ref Range   WBC 0.4 (LL) 4.0 - 10.5 K/uL    Comment: REPEATED TO VERIFY CRITICAL RESULT CALLED TO, READ BACK BY AND VERIFIED WITH: WAGONER,R. AT 0510 ON 08/04/2018 BY EVA    RBC 2.64 (L) 4.22 - 5.81 MIL/uL   Hemoglobin 7.7 (L) 13.0 - 17.0 g/dL   HCT 25.0 (L) 39.0 - 52.0 %   MCV 94.7 78.0 - 100.0 fL   MCH 29.2 26.0 - 34.0 pg   MCHC 30.8 30.0 - 36.0 g/dL   RDW 16.6 (H) 11.5 - 15.5 %   Platelets 40 (L) 150 - 400  K/uL    Comment: SPECIMEN CHECKED FOR CLOTS CONSISTENT WITH PREVIOUS RESULT    Neutrophils Relative % 13 %   Neutro Abs 0.1 1.7 - 7.7 K/uL   Lymphocytes Relative 79 %   Lymphs Abs 0.3 0.7 - 4.0 K/uL   Monocytes Relative 5 %   Monocytes Absolute 0.0 0.1 - 1.0 K/uL   Eosinophils Relative 0 %   Eosinophils Absolute 0.0 0.0 - 0.7 K/uL   Basophils Relative 3 %   Basophils Absolute 0.0 0.0 - 0.1 K/uL    Comment: Performed at [facility], [address] [place], [place] [address]      RADIOGRAPHY: Dg Chest 2 View  Result Date: 08/02/2018 CLINICAL DATA:  Fever, shortness of breath EXAM: CHEST - 2 VIEW COMPARISON:  08/02/2018 FINDINGS: Right Port-A-Cath in place with the tip in the right atrium, stable. Chronic volume loss and opacity throughout the right hemithorax, likely moderate to large right effusion and right lung airspace disease. No confluent opacity on the left. Heart is mildly enlarged. No change since prior study. IMPRESSION: Stable probable moderate to large right effusion and right lung airspace disease. Volume loss on the right. Electronically Signed   By: Rolm Baptise M.D.   On: 08/02/2018 21:35   Dg Chest 2 View  Result Date: 08/02/2018 CLINICAL DATA:  Cough for 6 months slightly worsened; history of muscle partial of the RIGHT lung, asthma, COPD, hypertension EXAM: CHEST - 2 VIEW COMPARISON:  07/15/2018 FINDINGS: RIGHT jugular Port-A-Cath with tip projecting over RIGHT atrium. Volume loss in the RIGHT hemithorax with  mediastinal shift to the RIGHT. Heart size stable. Chronic pleuroparenchymal opacities in the RIGHT hemithorax appear unchanged since the previous exam due to a combination of pleural thickening/effusion and subtotal collapse of the RIGHT lung, likely related to prior treatment for lung cancer. Slightly increased interstitial markings in LEFT lung could represent atypical infiltrate or edema. Tiny pleural effusion blunts the posterior LEFT costophrenic angle. No pneumothorax. Bones demineralized. IMPRESSION: Chronic volume loss and opacity of the RIGHT hemithorax likely related to prior treatment for lung cancer. New interstitial infiltrates LEFT lung question atypical infection versus edema. Tiny LEFT pleural effusion. Electronically Signed   By: Lavonia Dana M.D.   On: 08/02/2018 20:42        ASSESSMENT and plan:  1.  Pancytopenia: This is from myelosuppression from chemotherapy.  We have cut back on the dose of cycle 2 by 33%.  Still he had considerable myelosuppression requiring blood transfusions and hospital admission for neutropenic fever.  His white count today is 0.4.  Platelet count is 40.  He has received Neulasta after his cycle 2 on 07/25/2018.  White cell count will likely go up in the next day or 2.  He has already received blood transfusions and his hemoglobin improved.  2.  Stage IV adenocarcinoma the right lung: - He has received 2 cycles of palliative chemotherapy.  He was wondering if he could join an palliative care.  I have reinforced that we are treating his cancer in the palliative setting.  He can enroll in palliative care and transition to hospice when he desires. -I have also recommended follow-up in our office in 3 to 4 weeks with repeat CT scans.  We will decide whether to continue treatment by changing to single agent therapy if he desires to continue. - He would like to be discharged home tomorrow.  It is a possibility if he does not have any further fevers and his cultures  does not grow any organisms.  He already has Augmentin at home.  He will also continue Diflucan for his oral thrush.  All questions were answered. The patient knows to call the clinic with any problems, questions or concerns. We can certainly see the patient much sooner if necessary.    Derek Jack

## 2018-08-04 NOTE — Care Management (Signed)
This CM spoke with Cassandra, Hospice rep, who says they will see pt next week for admission.

## 2018-08-04 NOTE — Progress Notes (Signed)
PROGRESS NOTE    Erik WILTROUT  DJM:[id number] DOB: [birth year] DOA: 08/02/2018 PCP: [name], MD    Brief Narrative:  57 year old male with a history of adenocarcinoma with a long, currently on chemotherapy, presents with neutropenic fever.  Found to have significant cytopenias related to chemotherapy's.  He is receiving 2 units PRBC, started on intravenous antibiotics with cultures in process.  Continue to monitor for recurrence of fever.   Assessment & Plan:   Principal Problem:   Neutropenic fever ([place]) Active Problems:   Depression with anxiety   GERD   Adenocarcinoma of right lung ([place])   Essential hypertension   History of pulmonary embolism   Antineoplastic chemotherapy induced pancytopenia ([facility])   Hypokalemia   Symptomatic anemia   1. Neutropenic fever.  No obvious source of infection, and patient has been afebrile thus far. Cultures haven't shown any significant growth.  Will discontinue vancomycin and cefepime. Start on augmentin. 2. Pancytopenia, related to recent chemotherapy.  For anemia, he was transfused 2 unit prbc with some improvement in hemoglobin. Platelets continue to trend down and he is still leukopenic.  Continue to monitor thrombocytopenia and hold anticoagulants for now. 3. 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  Follow-up with oncology for further chemotherapy.  Currently shortness of breath appears to be at baseline. 4. Chronic respiratory failure with hypoxia.  On home oxygen requirement at this time.  No signs of distress. 5. History of pulmonary embolism.  Chronically on Eliquis.  Will hold for now since he is thrombocytopenic.  Resume once platelets have improved. 6. Hypokalemia.  Replace. 7. GERD.  Continue on PPI 8. Chronic pain syndrome.  Continue on methadone as per outpatient dosing. 9. COPD.  Continue bronchodilators as needed.   DVT prophylaxis: scd Code Status: DNR Family Communication:  No family present Disposition Plan: Discharge home once he is remained afebrile and cultures are negative.  Hopefully in the next 24 hours   Consultants:   Oncology  Palliative care  Procedures:     Antimicrobials:   Cefepime 8/28 >8/30  Vancomycin 8/28 >8/30  augmentin 8/30>   Subjective: Overall he is feeling better. No worsening shortness of breath. He has many questions about his prognosis and further treatment of cancer. He says that he is unsure whether he wants to continue chemotherapy and is leaning more towards hospice/palliative care route. He asks to speak to palliative care service  Objective: Vitals:   08/04/18 1000 08/04/18 1100 08/04/18 1200 08/04/18 1552  BP: 121/81  124/89   Pulse: (!) 120 (!) 119 (!) 116   Resp: 14 17 17    Temp:    98.8 F (37.1 C)  TempSrc:    Oral  SpO2: 99% 100% 98%   Weight:      Height:        Intake/Output Summary (Last 24 hours) at 08/04/2018 1746 Last data filed at 08/04/2018 1558 Gross per 24 hour  Intake 1503.33 ml  Output 4075 ml  Net -2571.67 ml   Filed Weights   08/02/18 1944 08/03/18 0134 08/03/18 0500  Weight: 98.9 kg 102.6 kg 102.6 kg    Examination:  General exam: Alert, awake, oriented x 3 Respiratory system: rhonchi at bases, diminished breath sounds on right. Respiratory effort normal. Cardiovascular system:tachycardic, regular. No murmurs, rubs, gallops. Gastrointestinal system: Abdomen is nondistended, soft and nontender. No organomegaly or masses felt. Normal bowel sounds heard. Central nervous system: Alert and oriented. No focal neurological deficits. Extremities: 1+ edema bilaterally Skin: No rashes,  lesions or ulcers Psychiatry: Judgement and insight appear normal. Mood & affect appropriate.      Data Reviewed: I have personally reviewed following labs and imaging studies  CBC: Recent Labs  Lab 08/01/18 1120 08/02/18 2026 08/03/18 0556 08/04/18 0414  WBC 0.4* 0.2* 0.4* 0.4*    NEUTROABS 0.0* 0.0 0.0* 0.1  HGB 5.9* 6.1* 6.5* 7.7*  HCT 18.9* 19.5* 20.9* 25.0*  MCV 96.4 93.3 92.5 94.7  PLT 152 81* 61* 40*   Basic Metabolic Panel: Recent Labs  Lab 08/02/18 2026 08/03/18 0556 08/04/18 0414  NA 134* 137 139  K 2.7* 3.0* 3.4*  CL 95* 99 100  CO2 33* 34* 33*  GLUCOSE 140* 84 83  BUN 8 7 8   CREATININE 0.85 0.77 0.81  CALCIUM 7.6* 7.7* 8.0*  MG 1.7  --   --    GFR: Estimated Creatinine Clearance: 127.7 mL/min (by C-G formula based on SCr of 0.81 mg/dL). Liver Function Tests: Recent Labs  Lab 08/02/18 2026 08/03/18 0556 08/04/18 0414  AST 48* 39 35  ALT 33 31 28  ALKPHOS 176* 160* 157*  BILITOT 1.1 1.4* 1.3*  PROT 7.9 7.4 7.4  ALBUMIN 2.0* 1.9* 1.9*   No results for input(s): LIPASE, AMYLASE in the last 168 hours. No results for input(s): AMMONIA in the last 168 hours. Coagulation Profile: Recent Labs  Lab 08/02/18 2026  INR 1.78   Cardiac Enzymes: Recent Labs  Lab 08/02/18 2026  TROPONINI <0.03   BNP (last 3 results) No results for input(s): PROBNP in the last 8760 hours. HbA1C: No results for input(s): HGBA1C in the last 72 hours. CBG: No results for input(s): GLUCAP in the last 168 hours. Lipid Profile: No results for input(s): CHOL, HDL, LDLCALC, TRIG, CHOLHDL, LDLDIRECT in the last 72 hours. Thyroid Function Tests: No results for input(s): TSH, T4TOTAL, FREET4, T3FREE, THYROIDAB in the last 72 hours. Anemia Panel: No results for input(s): VITAMINB12, FOLATE, FERRITIN, TIBC, IRON, RETICCTPCT in the last 72 hours. Sepsis Labs: Recent Labs  Lab 08/02/18 2026 08/02/18 2220  LATICACIDVEN 1.1 1.5    Recent Results (from the past 240 hour(s))  Culture, blood (routine x 2)     Status: None (Preliminary result)   Collection Time: 08/02/18  1:35 PM  Result Value Ref Range Status   Specimen Description BLOOD RIGHT FOREARM DRAWN BY RN  Final   Special Requests   Final    Blood Culture results may not be optimal due to an  excessive volume of blood received in culture bottles BOTTLES DRAWN AEROBIC AND ANAEROBIC   Culture   Final    NO GROWTH 2 DAYS Performed at [facility], [address] [place], [place] [address]    Report Status PENDING  Incomplete  Culture, blood (routine x 2)     Status: None (Preliminary result)   Collection Time: 08/02/18  1:45 PM  Result Value Ref Range Status   Specimen Description BLOOD LEFT FOREARM  Final   Special Requests   Final    Blood Culture results may not be optimal due to an excessive volume of blood received in culture bottles BOTTLES DRAWN AEROBIC AND ANAEROBIC   Culture   Final    NO GROWTH 2 DAYS Performed at [facility], [address] [place], [place] [address]    Report Status PENDING  Incomplete  Blood Culture (routine x 2)     Status: None (Preliminary result)   Collection Time: 08/02/18  8:26 PM  Result Value Ref Range  Status   Specimen Description RIGHT ANTECUBITAL  Final   Special Requests   Final    BOTTLES DRAWN AEROBIC AND ANAEROBIC Blood Culture adequate volume   Culture   Final    NO GROWTH 2 DAYS Performed at [facility], [address] [place], [place] [address]    Report Status PENDING  Incomplete  Blood Culture (routine x 2)     Status: None (Preliminary result)   Collection Time: 08/02/18  8:31 PM  Result Value Ref Range Status   Specimen Description BLOOD RIGHT FOREARM  Final   Special Requests   Final    BOTTLES DRAWN AEROBIC AND ANAEROBIC Blood Culture results may not be optimal due to an excessive volume of blood received in culture bottles   Culture   Final    NO GROWTH 2 DAYS Performed at [facility], [address], [place], [place] [address]    Report Status PENDING  Incomplete  Urine culture     Status: Abnormal   Collection Time: 08/02/18 10:52 PM  Result Value Ref Range Status   Specimen Description   Final    URINE, CLEAN CATCH Performed at [facility], [address], [place], [place] [address]    Special Requests    Final    NONE Performed at [facility], [address], [place], [place] [address]    Culture (A)  Final    <10,000 COLONIES/mL INSIGNIFICANT GROWTH Performed at [facility], [place] [address], [place],  [address]    Report Status 08/04/2018 FINAL  Final         Radiology Studies: Dg Chest 2 View  Result Date: 08/02/2018 CLINICAL DATA:  Fever, shortness of breath EXAM: CHEST - 2 VIEW COMPARISON:  08/02/2018 FINDINGS: Right Port-A-Cath in place with the tip in the right atrium, stable. Chronic volume loss and opacity throughout the right hemithorax, likely moderate to large right effusion and right lung airspace disease. No confluent opacity on the left. Heart is mildly enlarged. No change since prior study. IMPRESSION: Stable probable moderate to large right effusion and right lung airspace disease. Volume loss on the right. Electronically Signed   By: Rolm Baptise M.D.   On: 08/02/2018 21:35        Scheduled Meds: . amoxicillin-clavulanate  1 tablet Oral Q12H  . escitalopram  20 mg Oral Daily  . feeding supplement (ENSURE ENLIVE)  237 mL Oral BID BM  . fluconazole  200 mg Oral Daily  . furosemide  40 mg Oral Daily  . gabapentin  600 mg Oral BID  . ipratropium  0.5 mg Nebulization Q6H  . levalbuterol  0.63 mg Nebulization Q6H  . magic mouthwash  5 mL Oral TID   And  . lidocaine  5 mL Mouth/Throat TID  . methadone  20 mg Oral 5 X Daily  . mirtazapine  15 mg Oral QHS  . pantoprazole  40 mg Oral BID  . potassium chloride  40 mEq Oral Once   Continuous Infusions:    LOS: 2 days    Time spent: 88mins    Kathie Dike, MD [facility] Pager [phone]  If 7PM-7AM, please contact night-coverage [email or web address] Password [facility] 08/04/2018, 5:46 PM

## 2018-08-05 LAB — COMPREHENSIVE METABOLIC PANEL
ALT: 29 U/L (ref 0–44)
ANION GAP: 7 (ref 5–15)
AST: 36 U/L (ref 15–41)
Albumin: 2 g/dL — ABNORMAL LOW (ref 3.5–5.0)
Alkaline Phosphatase: 169 U/L — ABNORMAL HIGH (ref 38–126)
BUN: 8 mg/dL (ref 6–20)
CHLORIDE: 91 mmol/L — AB (ref 98–111)
CO2: 36 mmol/L — ABNORMAL HIGH (ref 22–32)
Calcium: 8.2 mg/dL — ABNORMAL LOW (ref 8.9–10.3)
Creatinine, Ser: 0.91 mg/dL (ref 0.61–1.24)
Glucose, Bld: 109 mg/dL — ABNORMAL HIGH (ref 70–99)
POTASSIUM: 3 mmol/L — AB (ref 3.5–5.1)
Sodium: 134 mmol/L — ABNORMAL LOW (ref 135–145)
Total Bilirubin: 1.2 mg/dL (ref 0.3–1.2)
Total Protein: 8.1 g/dL (ref 6.5–8.1)

## 2018-08-05 LAB — CBC WITH DIFFERENTIAL/PLATELET
Basophils Absolute: 0 10*3/uL (ref 0.0–0.1)
Basophils Relative: 0 %
EOS PCT: 2 %
Eosinophils Absolute: 0 10*3/uL (ref 0.0–0.7)
HEMATOCRIT: 25.6 % — AB (ref 39.0–52.0)
HEMOGLOBIN: 7.9 g/dL — AB (ref 13.0–17.0)
LYMPHS PCT: 50 %
Lymphs Abs: 0.3 10*3/uL — ABNORMAL LOW (ref 0.7–4.0)
MCH: 29.7 pg (ref 26.0–34.0)
MCHC: 30.9 g/dL (ref 30.0–36.0)
MCV: 96.2 fL (ref 78.0–100.0)
MONOS PCT: 10 %
Monocytes Absolute: 0.1 10*3/uL (ref 0.1–1.0)
NEUTROS PCT: 38 %
Neutro Abs: 0.2 10*3/uL — ABNORMAL LOW (ref 1.7–7.7)
Platelets: 23 10*3/uL — CL (ref 150–400)
RBC: 2.66 MIL/uL — AB (ref 4.22–5.81)
RDW: 16.3 % — ABNORMAL HIGH (ref 11.5–15.5)
WBC: 0.6 10*3/uL — AB (ref 4.0–10.5)

## 2018-08-05 MED ORDER — POTASSIUM CHLORIDE 10 MEQ/100ML IV SOLN
10.0000 meq | INTRAVENOUS | Status: AC
  Administered 2018-08-05 (×4): 10 meq via INTRAVENOUS
  Filled 2018-08-05 (×4): qty 100

## 2018-08-05 MED ORDER — HEPARIN SOD (PORK) LOCK FLUSH 100 UNIT/ML IV SOLN
10.0000 [IU] | Freq: Once | INTRAVENOUS | Status: AC
  Administered 2018-08-05: 10 [IU] via INTRAVENOUS
  Filled 2018-08-05: qty 5

## 2018-08-05 NOTE — Progress Notes (Signed)
Dr. Roderic Palau notified of critical values WBC 0.6 and Platelets 23.

## 2018-08-05 NOTE — Plan of Care (Signed)
  Problem: Pain Managment: Goal: General experience of comfort will improve Outcome: Progressing   

## 2018-08-05 NOTE — Discharge Summary (Signed)
Physician Discharge Summary  Erik [name] GBE:[id number] DOB: [birth year] DOA: 08/02/2018  PCP: [name], MD  Admit date: 08/02/2018 Discharge date: 08/05/2018  Admitted From: home Disposition:  home  Recommendations for Outpatient Follow-up:  1. Follow up with PCP in 1-2 weeks 2. Please obtain BMP/CBC in one week 3. Patient plans to enroll with hospice services next week  Home Health: resume home health services on discharge Equipment/Devices:  Discharge Condition: stable CODE STATUS: DNR Diet recommendation: heart healthy  Brief/Interim Summary: 57 year old male with a history of adenocarcinoma of lung, currently receiving chemotherapy, was admitted to the hospital with neutropenic fever.  He was found to have significant cytopenias including anemia and thrombocytopenia related to chemotherapy.  He received 2 units of PRBCs and was started on intravenous antibiotics.  Discharge Diagnoses:  Principal Problem:   Neutropenic fever ([place]) Active Problems:   Depression with anxiety   GERD   Adenocarcinoma of right lung (HCC)   Essential hypertension   History of pulmonary embolism   Antineoplastic chemotherapy induced pancytopenia (HCC)   Hypokalemia   Symptomatic anemia  1. Neutropenic fever.  No obvious source of infection, and patient did not have any significant fever spikes since admission of hospital.  Cultures did not show any significant growth.    Will complete his outpatient Augmentin course on discharge 2. Pancytopenia, related to recent chemotherapy.  For anemia, he was transfused 2 unit prbc with improvement in hemoglobin.  The patient did receive a dose of Neulasta with his last chemotherapy and white blood cell count is very slowly trending up.  Platelets continue to trend down.  He does not have any evidence of bleeding.  He was offered to stay in the hospital another 24 hours to ensure that his platelets not drop to the point that he would need a platelet  transfusion.  Patient reports that he does not wish to receive any further platelet transfusions, especially since he plans to enroll with hospice and wishes to focus on quality of life at this point.. 3. 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    Patient is followed by oncology for chemotherapy.  He reports that he does not wish to pursue further chemotherapy at this point.  He will follow-up with oncology.   4. Chronic respiratory failure with hypoxia.  On home oxygen requirement at this time.  No signs of distress. 5. History of pulmonary embolism.  Chronically on Eliquis.  Will hold for now since he is thrombocytopenic.    Can consider resuming once platelets have improved, if the patient desires. 6. Hypokalemia.  Replace. 7. GERD.  Continue on PPI 8. Chronic pain syndrome.  Continue on methadone as per outpatient dosing. 9. COPD.  Continue bronchodilators as needed. 10. Goals of care.  Patient met with oncology as well as palliative care.  After extensive discussions, he relates that he wants to pursue hospice/palliative care.  He reports that after receiving chemotherapy, he feels unwell and frequently ends up in the hospital.  He understands that his chemotherapy is palliative.  He would rather focus on quality of life and comfort at this point.  During our conversation, he says that he does not wish to pursue further chemotherapy, is excepting of his prognosis and wishes to enroll with hospice services.  Referrals have been made to [facility].  Discharge Instructions  Discharge Instructions    Diet - low sodium heart healthy   Complete by:  As directed    Increase activity slowly  Complete by:  As directed      Allergies as of 08/05/2018      Reactions   Demeclocycline Swelling   SWELLING REACTION UNSPECIFIED    Tetracyclines & Related Swelling   SWELLING REACTION UNSPECIFIED      Medication List    STOP taking these  medications   ALIMTA IV   amLODipine 5 MG tablet Commonly known as:  NORVASC   CARBOPLATIN IV   ELIQUIS 5 MG Tabs tablet Generic drug:  apixaban     TAKE these medications   acetaminophen 325 MG tablet Commonly known as:  TYLENOL Take 2 tablets (650 mg total) by mouth every 6 (six) hours as needed for mild pain (or Fever >/= 101).   albuterol (2.5 MG/3ML) 0.083% nebulizer solution Commonly known as:  PROVENTIL Take 3 mLs (2.5 mg total) by nebulization every 6 (six) hours as needed for wheezing or shortness of breath (if unable to experience relief by using combivent inhaler).   albuterol 108 (90 Base) MCG/ACT inhaler Commonly known as:  PROVENTIL HFA;VENTOLIN HFA Inhale 2 puffs into the lungs every 6 (six) hours as needed for wheezing or shortness of breath.   ALPRAZolam 1 MG tablet Commonly known as:  XANAX Take 1 mg by mouth 2 (two) times daily as needed for anxiety or sleep (takes 1 tablet and bedtime for sleep and 1 dose during the day only if needed).   amoxicillin-clavulanate 875-125 MG tablet Commonly known as:  AUGMENTIN Take 1 tablet by mouth 2 (two) times daily for 7 days.   bisacodyl 5 MG EC tablet Commonly known as:  DULCOLAX Take 1 tablet (5 mg total) by mouth daily as needed for moderate constipation.   escitalopram 20 MG tablet Commonly known as:  LEXAPRO Take 20 mg by mouth daily.   fluconazole 200 MG tablet Commonly known as:  DIFLUCAN Take 1 tablet (200 mg total) by mouth daily.   folic acid 1 MG tablet Commonly known as:  FOLVITE Take 1 tablet (1 mg total) by mouth daily.   furosemide 20 MG tablet Commonly known as:  LASIX Take 40 mg by mouth daily.   gabapentin 600 MG tablet Commonly known as:  NEURONTIN Take 600 mg by mouth 2 (two) times daily.   guaiFENesin 600 MG 12 hr tablet Commonly known as:  MUCINEX Take 600 mg by mouth 2 (two) times daily as needed (for congestion.).   Ipratropium-Albuterol 20-100 MCG/ACT Aers respimat Commonly  known as:  COMBIVENT Inhale 1 puff into the lungs every 6 (six) hours as needed for wheezing or shortness of breath.   lactulose 10 GM/15ML solution Commonly known as:  CHRONULAC Take 15 mLs (10 g total) by mouth 3 (three) times daily as needed for mild constipation or moderate constipation.   levalbuterol 0.63 MG/3ML nebulizer solution Commonly known as:  XOPENEX Take 3 mLs (0.63 mg total) by nebulization every 8 (eight) hours as needed for wheezing or shortness of breath.   loperamide 2 MG capsule Commonly known as:  IMODIUM Take 1 capsule (2 mg total) by mouth as needed for diarrhea or loose stools.   methadone 10 MG tablet Commonly known as:  DOLOPHINE Take 20 mg by mouth 5 (five) times daily. Takes 2 tablets 5 times daily   mirtazapine 15 MG tablet Commonly known as:  REMERON Take 1 tablet by mouth at bedtime.   Misc. Devices Misc POC at 2 lpm via [place] with conserver / pulse ox on exertion.   multivitamin with minerals Tabs  tablet Take 1 tablet by mouth daily. Centrum   ondansetron 8 MG tablet Commonly known as:  ZOFRAN TAKE 1 TABLET BY MOUTH EVERY 8 HOURS AS NEEDED FOR NAUSEA AND VOMITING. What changed:  See the new instructions.   OXYGEN Inhale 3 L into the lungs daily.   pantoprazole 40 MG tablet Commonly known as:  PROTONIX Take 1 tablet (40 mg total) by mouth 2 (two) times daily.   potassium chloride SA 20 MEQ tablet Commonly known as:  K-DUR,KLOR-CON Take 1 tablet (20 mEq total) by mouth daily.   prochlorperazine 10 MG tablet Commonly known as:  COMPAZINE Take 1 tablet (10 mg total) by mouth every 6 (six) hours as needed (Nausea or vomiting).   SOOTHE XP Soln Apply 2 drops to eye daily.   STIOLTO RESPIMAT 2.5-2.5 MCG/ACT Aers Generic drug:  Tiotropium Bromide-Olodaterol Inhale 1 puff into the lungs 3 (three) times daily.   sucralfate 1 GM/10ML suspension Commonly known as:  CARAFATE carafate 1gm/7m & viscous Lidocaine 2% 1:1 mixture.  Swish and  swallow 1 tablespoon four times a day.   tiZANidine 4 MG tablet Commonly known as:  ZANAFLEX Take 4 mg by mouth every 8 (eight) hours as needed for muscle spasms.       Allergies  Allergen Reactions  . Demeclocycline Swelling    SWELLING REACTION UNSPECIFIED   . Tetracyclines & Related Swelling    SWELLING REACTION UNSPECIFIED     Consultations:     Procedures/Studies: Dg Chest 2 View  Result Date: 08/02/2018 CLINICAL DATA:  Fever, shortness of breath EXAM: CHEST - 2 VIEW COMPARISON:  08/02/2018 FINDINGS: Right Port-A-Cath in place with the tip in the right atrium, stable. Chronic volume loss and opacity throughout the right hemithorax, likely moderate to large right effusion and right lung airspace disease. No confluent opacity on the left. Heart is mildly enlarged. No change since prior study. IMPRESSION: Stable probable moderate to large right effusion and right lung airspace disease. Volume loss on the right. Electronically Signed   By: KRolm BaptiseM.D.   On: 08/02/2018 21:35   Dg Chest 2 View  Result Date: 08/02/2018 CLINICAL DATA:  Cough for 6 months slightly worsened; history of muscle partial of the RIGHT lung, asthma, COPD, hypertension EXAM: CHEST - 2 VIEW COMPARISON:  07/15/2018 FINDINGS: RIGHT jugular Port-A-Cath with tip projecting over RIGHT atrium. Volume loss in the RIGHT hemithorax with mediastinal shift to the RIGHT. Heart size stable. Chronic pleuroparenchymal opacities in the RIGHT hemithorax appear unchanged since the previous exam due to a combination of pleural thickening/effusion and subtotal collapse of the RIGHT lung, likely related to prior treatment for lung cancer. Slightly increased interstitial markings in LEFT lung could represent atypical infiltrate or edema. Tiny pleural effusion blunts the posterior LEFT costophrenic angle. No pneumothorax. Bones demineralized. IMPRESSION: Chronic volume loss and opacity of the RIGHT hemithorax likely related to prior  treatment for lung cancer. New interstitial infiltrates LEFT lung question atypical infection versus edema. Tiny LEFT pleural effusion. Electronically Signed   By: MLavonia DanaM.D.   On: 08/02/2018 20:42   Dg Chest 2 View  Result Date: 07/15/2018 CLINICAL DATA:  Poor p.o. intake due to chemotherapy for lung cancer. EXAM: CHEST - 2 VIEW COMPARISON:  July 11, 2018 chest x-ray FINDINGS: There is near complete opacification of the right chest, unchanged. The right Port-A-Cath is stable. There is relative volume loss on the right. No pneumothorax. The left lung is clear. Visualized cardiomediastinal silhouette is stable. No other  interval changes. IMPRESSION: No significant interval change. Near complete opacification of the right chest with volume loss. Electronically Signed   By: Dorise Bullion III M.D   On: 07/15/2018 16:59   Dg Chest 2 View  Result Date: 07/11/2018 CLINICAL DATA:  Left-sided chest pain.  Hemoptysis. EXAM: CHEST - 2 VIEW COMPARISON:  05/31/2018 FINDINGS: Power port tip is unchanged in the right atrium. The left chest remains clear. No change in the appearance of the right hemithorax with volume loss, pleural density and only a small amount of aerated lung in the upper chest. No acute bone finding. IMPRESSION: Left chest is clear. Chronic volume loss in the right hemithorax with pleural and parenchymal density. No new finding. Electronically Signed   By: Nelson Chimes M.D.   On: 07/11/2018 10:39   Ct Angio Chest Pe W And/or Wo Contrast  Result Date: 07/11/2018 CLINICAL DATA:  Hemoptysis.  Right-sided lung carcinoma EXAM: CT ANGIOGRAPHY CHEST WITH CONTRAST TECHNIQUE: Multidetector CT imaging of the chest was performed using the standard protocol during bolus administration of intravenous contrast. Multiplanar CT image reconstructions and MIPs were obtained to evaluate the vascular anatomy. CONTRAST:  124m ISOVUE-370 IOPAMIDOL (ISOVUE-370) INJECTION 76% COMPARISON:  Chest CT June 19, 2018;  chest radiograph July 11, 2018 FINDINGS: Cardiovascular: There is no demonstrable pulmonary embolus. There is no thoracic aortic aneurysm or dissection. There is slight calcification in the proximal left subclavian artery. Other visualized proximal great vessel regions appear normal. There is a moderate pericardial effusion. Port-A-Cath tip is in the superior vena cava. Mediastinum/Nodes: Thyroid appears normal. There are multiple enlarged left axillary lymph nodes, largest measuring 1.7 x 1.7 cm, essentially stable. There are multiple prominent subpectoral and supraclavicular lymph nodes, essentially stable. Largest lymph node in these regions is in the left supraclavicular region measuring 2.1 x 1.3 cm. There remain multiple enlarged mediastinal lymph nodes throughout the chest. There is an enlarged aortopulmonary window node on the left measuring 2.0 x 1.8 cm, essentially stable. There is extensive subcarinal adenopathy with largest individual lymph node measuring 1.4 x 1.3 cm. There are several borderline prominent lymph nodes in the left hilum. The right hilum is obscured due to mass and consolidation. There may well be adenopathy in this area as well. No esophageal lesions are evident. Lungs/Pleura: Extensive consolidation throughout much of the right lung remains. There is felt to be a combination of airspace consolidation, atelectasis, and mass on the right as well as a small right pleural effusion. The changes on the right are similar to recent CT there is a suspected mass in the right lower lobe measuring approximately 4 x 4 cm which is difficult to discern from other surrounding consolidation and appears grossly stable. There are areas of mild atelectasis on the left. There is no mass or consolidation in the left lung. No left pleural effusion evident. Do a degree of underlying emphysematous change on the left is evident. Upper Abdomen: In the visualized upper abdomen, spleen appears enlarged although  incompletely visualized. Liver has a somewhat nodular contour consistent with cirrhosis. Adrenals appear unremarkable bilaterally. Musculoskeletal: No evident blastic or lytic bone lesions. There is degenerative change in the thoracic spine. Review of the MIP images confirms the above findings. IMPRESSION: 1. No evident pulmonary embolus. No thoracic aortic aneurysm or dissection. 2. Extensive opacity on the right due to combination of mass, atelectasis, consolidation as well as right pleural effusion. Appearance of the right lung is essentially stable compared to the previous study. 3. Underlying emphysematous  change left lung. No edema or consolidation on the left. No mass or pleural effusion on the left evident. 4.  Essentially stable multifocal adenopathy. 5.  Moderate pericardial effusion, similar to recent CT. 6. Visualized liver shows evidence of cirrhosis. Splenomegaly, incompletely visualized, also present on recent CT examination. Emphysema (ICD10-J43.9). Electronically Signed   By: Lowella Grip III M.D.   On: 07/11/2018 14:39   Dg Chest Port 1 View  Result Date: 07/15/2018 CLINICAL DATA:  57 y/o M; increased difficulty of breathing and decreased oxygen saturation. History of lung cancer. EXAM: PORTABLE CHEST 1 VIEW COMPARISON:  07/15/2018 chest radiograph. FINDINGS: Stable near complete opacification of the right hemithorax with volume loss and clear left lung given projection and technique right port catheter tip projects over lower SVC. No left-sided effusion. Stable cardiac silhouette with stable rightward mediastinal deviation from volume loss in the right hemithorax. No acute osseous abnormality is evident. IMPRESSION: Stable near complete opacification of right hemithorax with volume loss and clear left lung given projection and technique. Electronically Signed   By: Kristine Garbe M.D.   On: 07/15/2018 23:23       Subjective: Feeling better, has productive cough, denies  any worsening shortness of breath. No signs of bleeding  Discharge Exam: Vitals:   08/05/18 0000 08/05/18 0400 08/05/18 0747 08/05/18 0842  BP: 97/69     Pulse: (!) 106     Resp: 16     Temp: 98.7 F (37.1 C) 99.3 F (37.4 C) 97.6 F (36.4 C)   TempSrc: Oral Oral Oral   SpO2: 99%   100%  Weight:  100.6 kg    Height:        General: Pt is alert, awake, not in acute distress Cardiovascular: tachycardic, S1/S2 +, no rubs, no gallops Respiratory: rhonchi at bases Abdominal: Soft, NT, ND, bowel sounds + Extremities: 1+ edema, no cyanosis    The results of significant diagnostics from this hospitalization (including imaging, microbiology, ancillary and laboratory) are listed below for reference.     Microbiology: Recent Results (from the past 240 hour(s))  Culture, blood (routine x 2)     Status: None (Preliminary result)   Collection Time: 08/02/18  1:35 PM  Result Value Ref Range Status   Specimen Description BLOOD RIGHT FOREARM DRAWN BY RN  Final   Special Requests   Final    Blood Culture results may not be optimal due to an excessive volume of blood received in culture bottles BOTTLES DRAWN AEROBIC AND ANAEROBIC   Culture   Final    NO GROWTH 3 DAYS Performed at [facility], [address], [place], [place] [address]    Report Status PENDING  Incomplete  Culture, blood (routine x 2)     Status: None (Preliminary result)   Collection Time: 08/02/18  1:45 PM  Result Value Ref Range Status   Specimen Description BLOOD LEFT FOREARM  Final   Special Requests   Final    Blood Culture results may not be optimal due to an excessive volume of blood received in culture bottles BOTTLES DRAWN AEROBIC AND ANAEROBIC   Culture   Final    NO GROWTH 3 DAYS Performed at [facility], [address], [place], [place] [address]    Report Status PENDING  Incomplete  Blood Culture (routine x 2)     Status: None (Preliminary result)   Collection Time: 08/02/18  8:26 PM  Result Value Ref  Range Status   Specimen Description RIGHT ANTECUBITAL  Final   Special Requests   Final    BOTTLES DRAWN AEROBIC AND ANAEROBIC Blood Culture adequate volume   Culture   Final    NO GROWTH 3 DAYS Performed at [facility], [address], [place], [place] [address]    Report Status PENDING  Incomplete  Blood Culture (routine x 2)     Status: None (Preliminary result)   Collection Time: 08/02/18  8:31 PM  Result Value Ref Range Status   Specimen Description BLOOD RIGHT FOREARM  Final   Special Requests   Final    BOTTLES DRAWN AEROBIC AND ANAEROBIC Blood Culture results may not be optimal due to an excessive volume of blood received in culture bottles   Culture   Final    NO GROWTH 3 DAYS Performed at [facility], [address], [place], [place] [address]    Report Status PENDING  Incomplete  Urine culture     Status: Abnormal   Collection Time: 08/02/18 10:52 PM  Result Value Ref Range Status   Specimen Description   Final    URINE, CLEAN CATCH Performed at [facility], [address], [place], [place] [address]    Special Requests   Final    NONE Performed at [facility], [address], [place], [place] [address]    Culture (A)  Final    <10,000 COLONIES/mL INSIGNIFICANT GROWTH Performed at [facility], [place] [address], [place],  [address]    Report Status 08/04/2018 FINAL  Final     Labs: BNP (last 3 results) Recent Labs    03/06/18 1426 08/02/18 2027  BNP 143.0* 85.6   Basic Metabolic Panel: Recent Labs  Lab 08/02/18 2026 08/03/18 0556 08/04/18 0414 08/05/18 0531  NA 134* 137 139 134*  K 2.7* 3.0* 3.4* 3.0*  CL 95* 99 100 91*  CO2 33* 34* 33* 36*  GLUCOSE 140* 84 83 109*  BUN 8 7 8 8   CREATININE 0.85 0.77 0.81 0.91  CALCIUM 7.6* 7.7* 8.0* 8.2*  MG 1.7  --   --   --    Liver Function Tests: Recent Labs  Lab 08/02/18 2026 08/03/18 0556 08/04/18 0414 08/05/18 0531  AST 48* 39 35 36  ALT 33 31 28 29   ALKPHOS 176* 160* 157* 169*   BILITOT 1.1 1.4* 1.3* 1.2  PROT 7.9 7.4 7.4 8.1  ALBUMIN 2.0* 1.9* 1.9* 2.0*   No results for input(s): LIPASE, AMYLASE in the last 168 hours. No results for input(s): AMMONIA in the last 168 hours. CBC: Recent Labs  Lab 08/01/18 1120 08/02/18 2026 08/03/18 0556 08/04/18 0414 08/05/18 0531  WBC 0.4* 0.2* 0.4* 0.4* 0.6*  NEUTROABS 0.0* 0.0 0.0* 0.1 0.2*  HGB 5.9* 6.1* 6.5* 7.7* 7.9*  HCT 18.9* 19.5* 20.9* 25.0* 25.6*  MCV 96.4 93.3 92.5 94.7 96.2  PLT 152 81* 61* 40* 23*   Cardiac Enzymes: Recent Labs  Lab 08/02/18 2026  TROPONINI <0.03   BNP: Invalid input(s): POCBNP CBG: No results for input(s): GLUCAP in the last 168 hours. D-Dimer No results for input(s): DDIMER in the last 72 hours. Hgb A1c No results for input(s): HGBA1C in the last 72 hours. Lipid Profile No results for input(s): CHOL, HDL, LDLCALC, TRIG, CHOLHDL, LDLDIRECT in the last 72 hours. Thyroid function studies No results for input(s): TSH, T4TOTAL, T3FREE, THYROIDAB in the last 72 hours.  Invalid input(s): FREET3 Anemia work up No results for input(s): VITAMINB12, FOLATE, FERRITIN, TIBC, IRON, RETICCTPCT in the last 72 hours.  Urinalysis    Component Value Date/Time   COLORURINE YELLOW 08/02/2018 2253   APPEARANCEUR CLEAR 08/02/2018 2253   LABSPEC 1.004 (L) 08/02/2018 2253   PHURINE 7.0 08/02/2018 2253   GLUCOSEU NEGATIVE 08/02/2018 2253   HGBUR SMALL (A) 08/02/2018 2253   BILIRUBINUR NEGATIVE 08/02/2018 2253   KETONESUR NEGATIVE 08/02/2018 2253   PROTEINUR NEGATIVE 08/02/2018 2253   NITRITE NEGATIVE 08/02/2018 2253   LEUKOCYTESUR NEGATIVE 08/02/2018 2253   Sepsis Labs Invalid input(s): PROCALCITONIN,  WBC,  LACTICIDVEN Microbiology Recent Results (from the past 240 hour(s))  Culture, blood (routine x 2)     Status: None (Preliminary result)   Collection Time: 08/02/18  1:35 PM  Result Value Ref Range Status   Specimen Description BLOOD RIGHT FOREARM DRAWN BY RN  Final   Special  Requests   Final    Blood Culture results may not be optimal due to an excessive volume of blood received in culture bottles BOTTLES DRAWN AEROBIC AND ANAEROBIC   Culture   Final    NO GROWTH 3 DAYS Performed at [facility], [address], [place], [place] [address]    Report Status PENDING  Incomplete  Culture, blood (routine x 2)     Status: None (Preliminary result)   Collection Time: 08/02/18  1:45 PM  Result Value Ref Range Status   Specimen Description BLOOD LEFT FOREARM  Final   Special Requests   Final    Blood Culture results may not be optimal due to an excessive volume of blood received in culture bottles BOTTLES DRAWN AEROBIC AND ANAEROBIC   Culture   Final    NO GROWTH 3 DAYS Performed at [facility], [address], [place], [place] [address]    Report Status PENDING  Incomplete  Blood Culture (routine x 2)     Status: None (Preliminary result)   Collection Time: 08/02/18  8:26 PM  Result Value Ref Range Status   Specimen Description RIGHT ANTECUBITAL  Final   Special Requests   Final    BOTTLES DRAWN AEROBIC AND ANAEROBIC Blood Culture adequate volume   Culture   Final    NO GROWTH 3 DAYS Performed at [facility], [address] [place], [place] [address]    Report Status PENDING  Incomplete  Blood Culture (routine x 2)     Status: None (Preliminary result)   Collection Time: 08/02/18  8:31 PM  Result Value Ref Range Status   Specimen Description BLOOD RIGHT FOREARM  Final   Special Requests   Final    BOTTLES DRAWN AEROBIC AND ANAEROBIC Blood Culture results may not be optimal due to an excessive volume of blood received in culture bottles   Culture   Final    NO GROWTH 3 DAYS Performed at [facility], [address], [place] [address]    Report Status PENDING  Incomplete  Urine culture     Status: Abnormal   Collection Time: 08/02/18 10:52 PM  Result Value Ref Range Status   Specimen Description   Final    URINE, CLEAN CATCH Performed at [facility], [address], [place], [place] [address]    Special Requests   Final    NONE Performed at [facility], [address], [place], [place] [address]    Culture (A)  Final    <10,000 COLONIES/mL INSIGNIFICANT GROWTH Performed at [facility], [place] [address] [place], [place] [address]    Report Status 08/04/2018 FINAL  Final  Time coordinating discharge: 12mns  SIGNED:   JKathie Dike MD  [facility] 08/05/2018, 9:25 AM Pager   If 7PM-7AM, please contact night-coverage [email or web address] Password [email or web address]

## 2018-08-07 LAB — CULTURE, BLOOD (ROUTINE X 2)
CULTURE: NO GROWTH
CULTURE: NO GROWTH
CULTURE: NO GROWTH
CULTURE: NO GROWTH
SPECIAL REQUESTS: ADEQUATE

## 2018-08-08 ENCOUNTER — Encounter: Payer: Medicare Other | Admitting: Thoracic Surgery (Cardiothoracic Vascular Surgery)

## 2018-08-14 ENCOUNTER — Other Ambulatory Visit: Payer: Self-pay | Admitting: Thoracic Surgery (Cardiothoracic Vascular Surgery)

## 2018-08-14 ENCOUNTER — Other Ambulatory Visit (HOSPITAL_COMMUNITY): Payer: Medicare Other

## 2018-08-14 DIAGNOSIS — C3490 Malignant neoplasm of unspecified part of unspecified bronchus or lung: Secondary | ICD-10-CM

## 2018-08-15 ENCOUNTER — Encounter: Payer: Medicare Other | Admitting: Thoracic Surgery (Cardiothoracic Vascular Surgery)

## 2018-08-15 ENCOUNTER — Ambulatory Visit (HOSPITAL_COMMUNITY): Payer: Medicare Other | Admitting: Internal Medicine

## 2018-08-15 ENCOUNTER — Other Ambulatory Visit (HOSPITAL_COMMUNITY): Payer: Medicare Other

## 2018-08-15 ENCOUNTER — Ambulatory Visit (HOSPITAL_COMMUNITY): Payer: Medicare Other

## 2018-08-16 ENCOUNTER — Ambulatory Visit (HOSPITAL_COMMUNITY): Payer: Medicare Other

## 2018-08-28 ENCOUNTER — Other Ambulatory Visit (HOSPITAL_COMMUNITY): Payer: Self-pay | Admitting: Nurse Practitioner

## 2018-08-28 DIAGNOSIS — K219 Gastro-esophageal reflux disease without esophagitis: Secondary | ICD-10-CM

## 2018-08-29 ENCOUNTER — Telehealth (HOSPITAL_COMMUNITY): Payer: Self-pay | Admitting: *Deleted

## 2018-08-29 NOTE — Telephone Encounter (Signed)
Erik [name] from Hospice called today 9/24 to see why patient still had CT scans scheduled on Friday 9/27. Si Raider scheduler got the call and I advised her to cancel them since he is currently on Hospice and has been since 08/09/18. His being on Hospice was news to [facility] and myself. We did not know. However we have cancelled upcoming appointments and communicated this to staff.

## 2018-08-31 ENCOUNTER — Other Ambulatory Visit (HOSPITAL_COMMUNITY): Payer: Self-pay | Admitting: Nurse Practitioner

## 2018-09-01 ENCOUNTER — Ambulatory Visit (HOSPITAL_COMMUNITY)

## 2018-09-05 ENCOUNTER — Ambulatory Visit (HOSPITAL_COMMUNITY): Payer: Medicare Other | Admitting: Hematology

## 2018-10-02 ENCOUNTER — Other Ambulatory Visit (HOSPITAL_COMMUNITY): Payer: Self-pay | Admitting: *Deleted

## 2018-10-02 MED ORDER — POTASSIUM CHLORIDE CRYS ER 20 MEQ PO TBCR: 20.0000 meq | EXTENDED_RELEASE_TABLET | Freq: Every day | ORAL | 1 refills | Status: AC

## 2018-10-17 ENCOUNTER — Other Ambulatory Visit (HOSPITAL_COMMUNITY): Payer: Self-pay | Admitting: *Deleted

## 2018-10-17 DIAGNOSIS — K219 Gastro-esophageal reflux disease without esophagitis: Secondary | ICD-10-CM

## 2018-10-17 MED ORDER — PANTOPRAZOLE SODIUM 40 MG PO TBEC: DELAYED_RELEASE_TABLET | ORAL | 2 refills | Status: AC

## 2018-12-04 ENCOUNTER — Other Ambulatory Visit: Payer: Self-pay

## 2018-12-04 ENCOUNTER — Emergency Department (HOSPITAL_COMMUNITY)
Admission: EM | Admit: 2018-12-04 | Discharge: 2018-12-04 | Disposition: A | Discharge status: Home / Self Care | Payer: Medicare Other | Attending: Emergency Medicine | Admitting: Emergency Medicine

## 2018-12-04 ENCOUNTER — Emergency Department (HOSPITAL_COMMUNITY): Payer: Medicare Other

## 2018-12-04 ENCOUNTER — Encounter (HOSPITAL_COMMUNITY): Payer: Self-pay

## 2018-12-04 DIAGNOSIS — S52502A Unspecified fracture of the lower end of left radius, initial encounter for closed fracture: Secondary | ICD-10-CM | POA: Diagnosis not present

## 2018-12-04 DIAGNOSIS — Y929 Unspecified place or not applicable: Secondary | ICD-10-CM | POA: Insufficient documentation

## 2018-12-04 DIAGNOSIS — Y999 Unspecified external cause status: Secondary | ICD-10-CM | POA: Diagnosis not present

## 2018-12-04 DIAGNOSIS — W010XXA Fall on same level from slipping, tripping and stumbling without subsequent striking against object, initial encounter: Secondary | ICD-10-CM | POA: Diagnosis not present

## 2018-12-04 DIAGNOSIS — Z87891 Personal history of nicotine dependence: Secondary | ICD-10-CM | POA: Diagnosis not present

## 2018-12-04 DIAGNOSIS — J45909 Unspecified asthma, uncomplicated: Secondary | ICD-10-CM | POA: Insufficient documentation

## 2018-12-04 DIAGNOSIS — Y9389 Activity, other specified: Secondary | ICD-10-CM | POA: Diagnosis not present

## 2018-12-04 DIAGNOSIS — Z79899 Other long term (current) drug therapy: Secondary | ICD-10-CM | POA: Insufficient documentation

## 2018-12-04 DIAGNOSIS — I10 Essential (primary) hypertension: Secondary | ICD-10-CM | POA: Insufficient documentation

## 2018-12-04 DIAGNOSIS — S59912A Unspecified injury of left forearm, initial encounter: Secondary | ICD-10-CM | POA: Diagnosis present

## 2018-12-04 NOTE — ED Triage Notes (Signed)
Pt reports fell at home Saturday night.  Pt says he is unsteady on his feet due to lung cancer and back pain.  C/O swelling to left wrist and hand.

## 2018-12-04 NOTE — ED Provider Notes (Signed)
[facility] EMERGENCY DEPARTMENT Provider Note   CSN: [id number] Arrival date & time: 12/04/18  1224     History   Chief Complaint Chief Complaint  Patient presents with  . Wrist Pain    HPI Erik [name] is a 57 y.o. male.  HPI Patient states that on Friday evening he fell backward and landed on an outstretched left hand.  States he heard a snap.  He has had swelling and pain to the left wrist and hand since.  Denies any fever or chills.  No loss of consciousness.  Patient has history of COPD and is on chronic supplemental oxygen.   Past Medical History:  Diagnosis Date  . Arnold-Chiari syndrome ([place])   . Arthritis   . Asthma   . Chronic back pain   . COPD (chronic obstructive pulmonary disease) ([place])   . Depression   . Dyspnea    with exertion   . GERD (gastroesophageal reflux disease)   . Hypertension   . Pneumonia   . Pulmonary embolus ([place])   . Seasonal allergies   . Spinal stenosis of lumbar region   . 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 Wheezing     Patient Active Problem List   Diagnosis Date Noted  . Neutropenic fever ([place]) 08/02/2018  . Hypokalemia 08/02/2018  . Symptomatic anemia 08/02/2018  . DNR (do not resuscitate) discussion   . Encounter for hospice care discussion   . Antineoplastic chemotherapy induced pancytopenia ([place]) 07/15/2018  . Hemoptysis 07/15/2018  . Palliative care by specialist   . Malnutrition of moderate degree 03/30/2018  . History of pulmonary embolism 03/23/2018  . Bronchiectasis with acute exacerbation ([place])   . Acute on chronic respiratory failure with hypoxia ([place])   . Essential hypertension 03/07/2018  . COPD (chronic obstructive pulmonary disease) ([place]) 03/06/2018  . Taking multiple medications for chronic disease 03/08/2017  . Radiation-induced esophagitis 02/02/2017  . Loss of weight 12/10/2016  . Goals of care, counseling/discussion 12/10/2016  . Esophageal dysphagia 12/10/2016  . Abdominal  pain, epigastric 12/10/2016  . 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 GERD 01/13/2009  . ACTINIC KERATOSIS 01/13/2009  . Depression with anxiety 12/27/2008  . ASTHMA 12/27/2008  . OSTEOARTHRITIS 12/27/2008  . LOW BACK PAIN 12/27/2008    Past Surgical History:  Procedure Laterality Date  . APPLICATION OF WOUND VAC  04/19/2018   Procedure: APPLICATION OF WOUND VAC;  Surgeon: [name], MD;  Location: [place];  Service: Vascular;;  . APPLICATION OF WOUND VAC Right 04/21/2018   Procedure: WOUND VAC CHANGE;  Surgeon: [name], MD;  Location: [place];  Service: Thoracic;  Laterality: Right;  . APPLICATION OF WOUND VAC N/A 04/24/2018   Procedure: WOUND VAC CHANGE;  Surgeon: [name], MD;  Location: [place];  Service: Thoracic;  Laterality: N/A;  . BACK SURGERY     5 total  . BIOPSY  01/03/2017   Procedure: BIOPSY;  Surgeon: Daneil Dolin, MD;  [facility];  Service: Endoscopy;;  gastric  . CHEST TUBE INSERTION Right 04/19/2018   Procedure: CHEST TUBE INSERTION;  Surgeon: [name], MD;  Location: [place];  Service: Vascular;  Laterality: Right;  . CHEST TUBE INSERTION Right 04/19/2018   Procedure: CHEST TUBE INSERTION;  Surgeon: [name], MD;  Location: [place];  Service: Thoracic;  Laterality: Right;  . COLONOSCOPY WITH PROPOFOL N/A 04/04/2017   Procedure: COLONOSCOPY WITH PROPOFOL;  Surgeon: [name]  Rourk, MD;  [facility];  Service: Endoscopy;  Laterality: N/A;  8:45am  . DECORTICATION Right 03/28/2018   Procedure: DECORTICATION;  Surgeon: [name], MD;  Location: [facility];  Service: Thoracic;  Laterality: Right;  . ESOPHAGOGASTRODUODENOSCOPY (EGD) WITH PROPOFOL N/A 01/03/2017   Procedure: ESOPHAGOGASTRODUODENOSCOPY (EGD) WITH PROPOFOL;  Surgeon: Daneil Dolin, MD;  [facility];  Service: Endoscopy;  Laterality: N/A;  . I&D EXTREMITY Right 04/19/2018   Procedure: IRRIGATION AND  DEBRIDEMENT RIGHT CHEST WOUND;  Surgeon: [name], MD;  Location: [place];  Service: Vascular;  Laterality: Right;  . MALONEY DILATION N/A 01/03/2017   Procedure: Venia Minks DILATION;  Surgeon: Daneil Dolin, MD;  [facility];  Service: Endoscopy;  Laterality: N/A;  . MULTIPLE EXTRACTIONS WITH ALVEOLOPLASTY N/A 07/08/2014   Procedure: MULTIPLE EXTRACION WITH ALVEOLOPLASTY with EXCISION LESION RIGHT SIDE OF TONGUE;  Surgeon: Gae Bon, DDS;  Location: [place];  Service: Oral Surgery;  Laterality: N/A;  . PORTACATH PLACEMENT Right 07/30/2016   Procedure: INSERTION PORT-A-CATH;  Surgeon: Vickie Epley, MD;  [facility];  Service: Vascular;  Laterality: Right;  . THORACOTOMY  04/19/2018   Procedure: THORACOTOMY MAJOR;  Surgeon: [name], MD;  Location: [place];  Service: Vascular;;  . VIDEO ASSISTED THORACOSCOPY Right 04/21/2018   Procedure: VIDEO ASSISTED THORACOSCOPY;  Surgeon: [name], MD;  Location: [place];  Service: Thoracic;  Laterality: Right;  [name] VIDEO ASSISTED THORACOSCOPY Right 04/24/2018   Procedure: VIDEO ASSISTED THORACOSCOPY;  Surgeon: [name], MD;  Location: [place];  Service: Thoracic;  Laterality: Right;  [name] VIDEO ASSISTED THORACOSCOPY (VATS)/EMPYEMA Right 03/28/2018   Procedure: VIDEO ASSISTED THORACOSCOPY (VATS)/EMPYEMA;  Surgeon: [name], MD;  Location: [place];  Service: Thoracic;  Laterality: Right;  [name] VIDEO BRONCHOSCOPY WITH ENDOBRONCHIAL ULTRASOUND N/A 07/19/2016   Procedure: VIDEO BRONCHOSCOPY WITH ENDOBRONCHIAL ULTRASOUND;  Surgeon: [name], MD;  Location: [place];  Service: Thoracic;  Laterality: N/A;        Home Medications    Prior to Admission medications   Medication Sig Start Date End Date Taking? Authorizing Provider  acetaminophen (TYLENOL) 325 MG tablet Take 2 tablets (650 mg total) by mouth every 6 (six) hours as needed for mild pain (or Fever >/= 101). 05/01/18   Barrett, Erin R,  PA-C  albuterol (PROVENTIL) (2.5 MG/3ML) 0.083% nebulizer solution Take 3 mLs (2.5 mg total) by nebulization every 6 (six) hours as needed for wheezing or shortness of breath (if unable to experience relief by using combivent inhaler). 03/08/18   Barton Dubois, MD  ALPRAZolam Duanne Moron) 1 MG tablet Take 1 mg by mouth 2 (two) times daily as needed for anxiety or sleep (takes 1 tablet and bedtime for sleep and 1 dose during the day only if needed).  06/26/16   [provider]  Artificial Tear Solution (SOOTHE XP) SOLN Apply 2 drops to eye daily.    [provider]  bisacodyl (DULCOLAX) 5 MG EC tablet Take 1 tablet (5 mg total) by mouth daily as needed for moderate constipation. 07/18/18   Manuella Ghazi, Pratik D, DO  escitalopram (LEXAPRO) 20 MG tablet Take 20 mg by mouth daily.  05/10/18   [provider]  fluconazole (DIFLUCAN) 200 MG tablet Take 1 tablet (200 mg total) by mouth daily. 08/02/18   Lockamy, Randi L, NP-C  folic acid (FOLVITE) 1 MG tablet Take 1 tablet (1 mg total) by mouth daily. 06/27/18   Lockamy,  Randi L, NP-C  furosemide (LASIX) 20 MG tablet Take 40 mg by mouth daily.    [provider]  gabapentin (NEURONTIN) 600 MG tablet Take 600 mg by mouth 2 (two) times daily.  08/19/17   [provider]  guaiFENesin (MUCINEX) 600 MG 12 hr tablet Take 600 mg by mouth 2 (two) times daily as needed (for congestion.).    [provider]  Ipratropium-Albuterol (COMBIVENT) 20-100 MCG/ACT AERS respimat Inhale 1 puff into the lungs every 6 (six) hours as needed for wheezing or shortness of breath. 03/08/18   Barton Dubois, MD  lactulose (CHRONULAC) 10 GM/15ML solution Take 15 mLs (10 g total) by mouth 3 (three) times daily as needed for mild constipation or moderate constipation. 01/23/18   Jacquelin Hawking, NP  levalbuterol Penne Lash) 0.63 MG/3ML nebulizer solution Take 3 mLs (0.63 mg total) by nebulization every 8 (eight) hours as needed for wheezing or shortness of  breath. 05/01/18   Barrett, Erin R, PA-C  loperamide (IMODIUM) 2 MG capsule Take 1 capsule (2 mg total) by mouth as needed for diarrhea or loose stools. 04/08/18   Patrecia Pour, MD  methadone (DOLOPHINE) 10 MG tablet Take 20 mg by mouth 5 (five) times daily. Takes 2 tablets 5 times daily 06/26/16   [provider]  mirtazapine (REMERON) 15 MG tablet Take 1 tablet by mouth at bedtime. 08/17/17   [provider]  Misc. Devices MISC POC at 2 lpm via [place] with conserver / pulse ox on exertion. 05/03/18   Higgs, Mathis Dad, MD  Multiple Vitamin (MULTIVITAMIN WITH MINERALS) TABS tablet Take 1 tablet by mouth daily. Centrum    [provider]  ondansetron (ZOFRAN) 8 MG tablet TAKE 1 TABLET BY MOUTH EVERY 8 HOURS AS NEEDED FOR NAUSEA AND VOMITING. Patient taking differently: Take 8 mg by mouth every 8 (eight) hours as needed. TAKE 1 TABLET BY MOUTH EVERY 8 HOURS AS NEEDED FOR NAUSEA AND VOMITING. 03/02/18   Holley Bouche, NP  OXYGEN Inhale 3 L into the lungs daily.     [provider]  pantoprazole (PROTONIX) 40 MG tablet TAKE (1) TABLET BY MOUTH TWICE DAILY. 10/17/18   Derek Jack, MD  potassium chloride SA (K-DUR,KLOR-CON) 20 MEQ tablet Take 1 tablet (20 mEq total) by mouth daily. 10/02/18   Derek Jack, MD  prochlorperazine (COMPAZINE) 10 MG tablet Take 1 tablet (10 mg total) by mouth every 6 (six) hours as needed (Nausea or vomiting). 06/28/18   Derek Jack, MD  STIOLTO RESPIMAT 2.5-2.5 MCG/ACT AERS Inhale 1 puff into the lungs 3 (three) times daily. 07/04/18   [provider]  sucralfate (CARAFATE) 1 GM/10ML suspension carafate 1gm/76ml & viscous Lidocaine 2% 1:1 mixture.  Swish and swallow 1 tablespoon four times a day. 07/18/18   Derek Jack, MD  tiZANidine (ZANAFLEX) 4 MG tablet Take 4 mg by mouth every 8 (eight) hours as needed for muscle spasms.  06/03/16   [provider]  VENTOLIN HFA 108 (90 Base) MCG/ACT inhaler INHALE  2 PUFFS EVERY 6 HOURS AS NEEDED. 08/31/18   Glennie Isle, NP-C    Family History Family History  Problem Relation Age of Onset  . Cancer Mother        breast cancer  . Heart failure Brother   . Colon cancer Neg Hx        not sure, ?grandfather and/or uncle    Social History Social History   Tobacco Use  . Smoking status: Former Smoker  Packs/day: 0.25    Years: 38.00    Pack years: 9.50    Types: Cigarettes    Last attempt to quit: 02/16/2018    Years since quitting: 0.7  . Smokeless tobacco: Former Systems developer    Types: Chew  . Tobacco comment: [place] STAY  Substance Use Topics  . Alcohol use: Not Currently    Alcohol/week: 2.0 standard drinks    Types: 2 Cans of beer per week  . Drug use: Yes    Frequency: 7.0 times per week    Types: Marijuana    Comment: occasionally     Allergies   Demeclocycline and Tetracyclines & related   Review of Systems Review of Systems  Constitutional: Negative for chills and fever.  Musculoskeletal: Positive for arthralgias and joint swelling.  Skin: Negative for rash and wound.  Neurological: Negative for dizziness, weakness, numbness and headaches.  All other systems reviewed and are negative.    Physical Exam Updated Vital Signs BP (!) 119/91 (BP Location: Right Arm)   Pulse 92   Temp 99.5 F (37.5 C) (Oral)   Resp 18   Ht 6' (1.829 m)   Wt 99.8 kg   SpO2 95%   BMI 29.84 kg/m   Physical Exam Vitals signs and nursing note reviewed.  Constitutional:      Appearance: Normal appearance. He is well-developed.  HENT:     Head: Normocephalic and atraumatic.     Mouth/Throat:     Mouth: Mucous membranes are moist.  Eyes:     Extraocular Movements: Extraocular movements intact.     Pupils: Pupils are equal, round, and reactive to light.  Neck:     Musculoskeletal: Normal range of motion and neck supple.     Comments: No posterior midline cervical tenderness to palpation. Cardiovascular:     Rate  and Rhythm: Normal rate and regular rhythm.  Pulmonary:     Effort: Pulmonary effort is normal.     Breath sounds: Normal breath sounds.  Abdominal:     General: Bowel sounds are normal.     Palpations: Abdomen is soft.     Tenderness: There is no abdominal tenderness. There is no guarding or rebound.  Musculoskeletal: Normal range of motion.        General: Swelling and tenderness present.     Comments: Tenderness to palpation of the distal left radius.  No snuffbox tenderness.  Diffuse swelling to the mid forearm to the hand.  No definite increased warmth.  Full range of motion of left elbow without pain.  Full range of motion of the left shoulder without pain.  Distal pulses are 2+.  Skin:    General: Skin is warm and dry.     Capillary Refill: Capillary refill takes less than 2 seconds.     Findings: No erythema or rash.  Neurological:     Mental Status: He is alert and oriented to person, place, and time.  Psychiatric:        Behavior: Behavior normal.      ED Treatments / Results  Labs (all labs ordered are listed, but only abnormal results are displayed) Labs Reviewed - No data to display  EKG None  Radiology Dg Wrist Complete Left  Result Date: 12/04/2018 CLINICAL DATA:  Pain, swelling.  Fall. EXAM: LEFT WRIST - COMPLETE 3+ VIEW COMPARISON:  None. FINDINGS: There is a fracture noted through the distal left radius. Minimal displacement of a posterior fracture fragment on the lateral view. No  subluxation or dislocation. Diffuse soft tissue swelling. IMPRESSION: Distal left radial fracture. Electronically Signed   By: Rolm Baptise M.D.   On: 12/04/2018 13:19    Procedures Procedures (including critical care time)  Medications Ordered in ED Medications - No data to display   Initial Impression / Assessment and Plan / ED Course  I have reviewed the triage vital signs and the nursing notes.  Pertinent labs & imaging results that were available during my care of the  patient were reviewed by me and considered in my medical decision making (see chart for details).     Left distal radius fracture.  Will place in splint and sling.  Advised keeping the arm elevated to reduce swelling.  Advised follow-up with orthopedics. Final Clinical Impressions(s) / ED Diagnoses   Final diagnoses:  Closed fracture of distal end of left radius, unspecified fracture morphology, initial encounter    ED Discharge Orders    None       Julianne Rice, MD 12/04/18 1500

## 2018-12-07 ENCOUNTER — Ambulatory Visit (INDEPENDENT_AMBULATORY_CARE_PROVIDER_SITE_OTHER): Payer: Medicare Other | Admitting: Orthopaedic Surgery

## 2018-12-07 ENCOUNTER — Encounter: Payer: Self-pay | Admitting: Orthopaedic Surgery

## 2018-12-07 VITALS — BP 99/68 | HR 106 | Ht 72.0 in | Wt 231.0 lb

## 2018-12-07 DIAGNOSIS — S52592A Other fractures of lower end of left radius, initial encounter for closed fracture: Secondary | ICD-10-CM

## 2018-12-07 NOTE — Progress Notes (Signed)
Subjective:    Patient ID: Erik [name], male    DOB: 1961/08/31, 58 y.o.   MRN: [id number]  HPI He fell backwards and hurt his left nondominant wrist on 12-04-18.  He has problems with balance and has been treated for lung cancer.  He has no other injury.  He was placed in a splint but he took it off as his hand went to sleep and stayed asleep until he took it off.  He has some swelling.  He is moving his hand and wrist fairly well despite some pain.  He says he would like to avoid a cast if at all possible.  He has no pain with supination and pronation.  I told him he could use a cock-up splint with precautions.  He is to wear it except when bathing.  He is not to lift anything with the left hand.  He is agreeable to this.  I will see him in one week with repeat x-rays.  He is to call or go to ER if he has a problem.   Review of Systems  Constitutional: Positive for activity change.  Respiratory: Positive for shortness of breath.   Endocrine: Positive for cold intolerance.  Musculoskeletal: Positive for arthralgias, back pain and joint swelling.  Allergic/Immunologic: Positive for environmental allergies.  All other systems reviewed and are negative.  For Review of Systems, all other systems reviewed and are negative.  The following is a summary of the past history medically, past history surgically, known current medicines, social history and family history.  This information is gathered electronically by the computer from prior information and documentation.  I review this each visit and have found including this information at this point in the chart is beneficial and informative.   Past Medical History:  Diagnosis Date  . Arnold-Chiari syndrome ([place])   . Arthritis   . Asthma   . Chronic back pain   . COPD (chronic obstructive pulmonary disease) ([place])   . Depression   . Dyspnea    with exertion   . GERD (gastroesophageal reflux disease)   . Hypertension   . Pneumonia   .  Pulmonary embolus ([place])   . Seasonal allergies   . Spinal stenosis of lumbar region   . 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 Wheezing     Past Surgical History:  Procedure Laterality Date  . APPLICATION OF WOUND VAC  04/19/2018   Procedure: APPLICATION OF WOUND VAC;  Surgeon: [name], MD;  Location: [place];  Service: Vascular;;  . APPLICATION OF WOUND VAC Right 04/21/2018   Procedure: WOUND VAC CHANGE;  Surgeon: [name], MD;  Location: [place];  Service: Thoracic;  Laterality: Right;  . APPLICATION OF WOUND VAC N/A 04/24/2018   Procedure: WOUND VAC CHANGE;  Surgeon: [name], MD;  Location: [place];  Service: Thoracic;  Laterality: N/A;  . BACK SURGERY     5 total  . BIOPSY  01/03/2017   Procedure: BIOPSY;  Surgeon: Daneil Dolin, MD;  [facility];  Service: Endoscopy;;  gastric  . CHEST TUBE INSERTION Right 04/19/2018   Procedure: CHEST TUBE INSERTION;  Surgeon: [name], MD;  Location: [place];  Service: Vascular;  Laterality: Right;  . CHEST TUBE INSERTION Right 04/19/2018   Procedure: CHEST TUBE INSERTION;  Surgeon: [name], MD;  Location: [place];  Service: Thoracic;  Laterality: Right;  . COLONOSCOPY WITH PROPOFOL N/A 04/04/2017  Procedure: COLONOSCOPY WITH PROPOFOL;  Surgeon: Daneil Dolin, MD;  [facility];  Service: Endoscopy;  Laterality: N/A;  8:45am  . DECORTICATION Right 03/28/2018   Procedure: DECORTICATION;  Surgeon: [name], MD;  [facility];  Service: Thoracic;  Laterality: Right;  . ESOPHAGOGASTRODUODENOSCOPY (EGD) WITH PROPOFOL N/A 01/03/2017   Procedure: ESOPHAGOGASTRODUODENOSCOPY (EGD) WITH PROPOFOL;  Surgeon: Daneil Dolin, MD;  [facility];  Service: Endoscopy;  Laterality: N/A;  . I&D EXTREMITY Right 04/19/2018   Procedure: IRRIGATION AND DEBRIDEMENT RIGHT CHEST WOUND;  Surgeon: [name], MD;  Location: [place];  Service:  Vascular;  Laterality: Right;  . MALONEY DILATION N/A 01/03/2017   Procedure: Venia Minks DILATION;  Surgeon: Daneil Dolin, MD;  [facility];  Service: Endoscopy;  Laterality: N/A;  . MULTIPLE EXTRACTIONS WITH ALVEOLOPLASTY N/A 07/08/2014   Procedure: MULTIPLE EXTRACION WITH ALVEOLOPLASTY with EXCISION LESION RIGHT SIDE OF TONGUE;  Surgeon: Gae Bon, DDS;  Location: [place];  Service: Oral Surgery;  Laterality: N/A;  . PORTACATH PLACEMENT Right 07/30/2016   Procedure: INSERTION PORT-A-CATH;  Surgeon: Vickie Epley, MD;  [facility];  Service: Vascular;  Laterality: Right;  . THORACOTOMY  04/19/2018   Procedure: THORACOTOMY MAJOR;  Surgeon: [name], MD;  Location: [place];  Service: Vascular;;  . VIDEO ASSISTED THORACOSCOPY Right 04/21/2018   Procedure: VIDEO ASSISTED THORACOSCOPY;  Surgeon: [name], MD;  Location: [place];  Service: Thoracic;  Laterality: Right;  [name] VIDEO ASSISTED THORACOSCOPY Right 04/24/2018   Procedure: VIDEO ASSISTED THORACOSCOPY;  Surgeon: [name], MD;  Location: [place];  Service: Thoracic;  Laterality: Right;  [name] VIDEO ASSISTED THORACOSCOPY (VATS)/EMPYEMA Right 03/28/2018   Procedure: VIDEO ASSISTED THORACOSCOPY (VATS)/EMPYEMA;  Surgeon: [name], MD;  Location: [place];  Service: Thoracic;  Laterality: Right;  [name] VIDEO BRONCHOSCOPY WITH ENDOBRONCHIAL ULTRASOUND N/A 07/19/2016   Procedure: VIDEO BRONCHOSCOPY WITH ENDOBRONCHIAL ULTRASOUND;  Surgeon: [name], MD;  Location: [place];  Service: Thoracic;  Laterality: N/A;    Current Outpatient Medications on File Prior to Visit  Medication Sig Dispense Refill  . acetaminophen (TYLENOL) 325 MG tablet Take 2 tablets (650 mg total) by mouth every 6 (six) hours as needed for mild pain (or Fever >/= 101).    [name] albuterol (PROVENTIL) (2.5 MG/3ML) 0.083% nebulizer solution Take 3 mLs (2.5 mg total) by nebulization every 6 (six) hours as needed for wheezing or  shortness of breath (if unable to experience relief by using combivent inhaler). 75 mL 4  . ALPRAZolam (XANAX) 1 MG tablet Take 1 mg by mouth 2 (two) times daily as needed for anxiety or sleep (takes 1 tablet and bedtime for sleep and 1 dose during the day only if needed).     . Artificial Tear Solution (SOOTHE XP) SOLN Apply 2 drops to eye daily.    . bisacodyl (DULCOLAX) 5 MG EC tablet Take 1 tablet (5 mg total) by mouth daily as needed for moderate constipation. 30 tablet 0  . escitalopram (LEXAPRO) 20 MG tablet Take 20 mg by mouth daily.     . fluconazole (DIFLUCAN) 200 MG tablet Take 1 tablet (200 mg total) by mouth daily. 5 tablet 0  . folic acid (FOLVITE) 1 MG tablet Take 1 tablet (1 mg total) by mouth daily. 30 tablet 2  . furosemide (LASIX) 20 MG tablet Take 40 mg by mouth daily.    [name] gabapentin (NEURONTIN) 600 MG tablet Take 600  mg by mouth 2 (two) times daily.     [name] guaiFENesin (MUCINEX) 600 MG 12 hr tablet Take 600 mg by mouth 2 (two) times daily as needed (for congestion.).    [name] Ipratropium-Albuterol (COMBIVENT) 20-100 MCG/ACT AERS respimat Inhale 1 puff into the lungs every 6 (six) hours as needed for wheezing or shortness of breath. 1 Inhaler 3  . lactulose (CHRONULAC) 10 GM/15ML solution Take 15 mLs (10 g total) by mouth 3 (three) times daily as needed for mild constipation or moderate constipation. 240 mL 0  . levalbuterol (XOPENEX) 0.63 MG/3ML nebulizer solution Take 3 mLs (0.63 mg total) by nebulization every 8 (eight) hours as needed for wheezing or shortness of breath. 3 mL 12  . loperamide (IMODIUM) 2 MG capsule Take 1 capsule (2 mg total) by mouth as needed for diarrhea or loose stools. 20 capsule 0  . methadone (DOLOPHINE) 10 MG tablet Take 20 mg by mouth 5 (five) times daily. Takes 2 tablets 5 times daily    . mirtazapine (REMERON) 15 MG tablet Take 1 tablet by mouth at bedtime.    . Misc. Devices MISC POC at 2 lpm via [place] with conserver / pulse ox on exertion. 1 each 0  .  Multiple Vitamin (MULTIVITAMIN WITH MINERALS) TABS tablet Take 1 tablet by mouth daily. Centrum    . ondansetron (ZOFRAN) 8 MG tablet TAKE 1 TABLET BY MOUTH EVERY 8 HOURS AS NEEDED FOR NAUSEA AND VOMITING. (Patient taking differently: Take 8 mg by mouth every 8 (eight) hours as needed. TAKE 1 TABLET BY MOUTH EVERY 8 HOURS AS NEEDED FOR NAUSEA AND VOMITING.) 60 tablet 1  . OXYGEN Inhale 3 L into the lungs daily.     . pantoprazole (PROTONIX) 40 MG tablet TAKE (1) TABLET BY MOUTH TWICE DAILY. 60 tablet 2  . potassium chloride SA (K-DUR,KLOR-CON) 20 MEQ tablet Take 1 tablet (20 mEq total) by mouth daily. 60 tablet 1  . prochlorperazine (COMPAZINE) 10 MG tablet Take 1 tablet (10 mg total) by mouth every 6 (six) hours as needed (Nausea or vomiting). 30 tablet 1  . STIOLTO RESPIMAT 2.5-2.5 MCG/ACT AERS Inhale 1 puff into the lungs 3 (three) times daily.    . sucralfate (CARAFATE) 1 GM/10ML suspension carafate 1gm/13ml & viscous Lidocaine 2% 1:1 mixture.  Swish and swallow 1 tablespoon four times a day. 360 mL 2  . tiZANidine (ZANAFLEX) 4 MG tablet Take 4 mg by mouth every 8 (eight) hours as needed for muscle spasms.     . VENTOLIN HFA 108 (90 Base) MCG/ACT inhaler INHALE 2 PUFFS EVERY 6 HOURS AS NEEDED. 18 g 0   Current Facility-Administered Medications on File Prior to Visit  Medication Dose Route Frequency Provider Last Rate Last Dose  . 0.9 %  sodium chloride infusion   Intravenous Continuous Baird Cancer, PA-C 10 mL/hr at 01/13/17 1330      Social History   Socioeconomic History  . Marital status: Divorced    Spouse name: Not on file  . Number of children: Not on file  . Years of education: Not on file  . Highest education level: Not on file  Occupational History  . Not on file  Social Needs  . Financial resource strain: Not on file  . Food insecurity:    Worry: Not on file    Inability: Not on file  . Transportation needs:    Medical: Not on file    Non-medical: Not on file    Tobacco Use  .  Smoking status: Former Smoker    Packs/day: 0.25    Years: 38.00    Pack years: 9.50    Types: Cigarettes    Last attempt to quit: 02/16/2018    Years since quitting: 0.8  . Smokeless tobacco: Former Systems developer    Types: Chew  . Tobacco comment: [place] STAY  Substance and Sexual Activity  . Alcohol use: Not Currently    Alcohol/week: 2.0 standard drinks    Types: 2 Cans of beer per week  . Drug use: Yes    Frequency: 7.0 times per week    Types: Marijuana    Comment: occasionally  . Sexual activity: Not Currently  Lifestyle  . Physical activity:    Days per week: Not on file    Minutes per session: Not on file  . Stress: Not on file  Relationships  . Social connections:    Talks on phone: Not on file    Gets together: Not on file    Attends religious service: Not on file    Active member of club or organization: Not on file    Attends meetings of clubs or organizations: Not on file    Relationship status: Not on file  . Intimate partner violence:    Fear of current or ex partner: Not on file    Emotionally abused: Not on file    Physically abused: Not on file    Forced sexual activity: Not on file  Other Topics Concern  . Not on file  Social History Narrative  . Not on file    Family History  Problem Relation Age of Onset  . Cancer Mother        breast cancer  . Heart failure Brother   . Colon cancer Neg Hx        not sure, ?grandfather and/or uncle    BP 99/68   Pulse (!) 106   Ht 6' (1.829 m)   Wt 231 lb (104.8 kg)   BMI 31.33 kg/m   Body mass index is 31.33 kg/m.     Objective:   Physical Exam Constitutional:      Appearance: He is well-developed.  HENT:     Head: Normocephalic and atraumatic.  Eyes:     Conjunctiva/sclera: Conjunctivae normal.     Pupils: Pupils are equal, round, and reactive to light.  Neck:     Musculoskeletal: Normal range of motion and neck supple.  Cardiovascular:     Rate and Rhythm:  Normal rate and regular rhythm.  Pulmonary:     Effort: Pulmonary effort is normal.  Abdominal:     Palpations: Abdomen is soft.  Musculoskeletal:     Left wrist: He exhibits decreased range of motion, tenderness, bony tenderness and swelling.       Arms:  Skin:    General: Skin is warm and dry.  Neurological:     Mental Status: He is alert and oriented to person, place, and time.     Cranial Nerves: No cranial nerve deficit.     Motor: No abnormal muscle tone.     Coordination: Coordination normal.     Deep Tendon Reflexes: Reflexes are normal and symmetric. Reflexes normal.  Psychiatric:        Behavior: Behavior normal.        Thought Content: Thought content normal.        Judgment: Judgment normal.     I have reviewed the ER record, x-rays and report.  Assessment & Plan:   Encounter Diagnosis  Name Primary?  . Other closed fracture of distal end of left radius, initial encounter Yes   A cock-up splint was given with precautions.  Return in one week.  X-rays left wrist on return out of the splint.  Call if any problem.  Precautions discussed.  Advil or Tylenol for pain.   Electronically Signed Sanjuana Kava, MD 1/2/20202:17 PM

## 2018-12-13 ENCOUNTER — Ambulatory Visit (INDEPENDENT_AMBULATORY_CARE_PROVIDER_SITE_OTHER): Payer: Medicare Other

## 2018-12-13 ENCOUNTER — Encounter: Payer: Self-pay | Admitting: Orthopaedic Surgery

## 2018-12-13 ENCOUNTER — Ambulatory Visit (INDEPENDENT_AMBULATORY_CARE_PROVIDER_SITE_OTHER): Payer: Medicaid Other | Admitting: Orthopaedic Surgery

## 2018-12-13 VITALS — BP 114/78 | HR 71 | Ht 72.0 in | Wt 215.0 lb

## 2018-12-13 DIAGNOSIS — S52592D Other fractures of lower end of left radius, subsequent encounter for closed fracture with routine healing: Secondary | ICD-10-CM

## 2018-12-13 NOTE — Progress Notes (Signed)
CC:  My wrist is sore  He has been using the cock-up splint on the left wrist as he does not want a cast and took the other one off.  He had a small fall but did not hurt his wrist.  NV intact.  There is swelling of the left wrist and decreased motion.  X-rays were done of the left wrist, reported separately.  Encounter Diagnosis  Name Primary?  . Other closed fracture of distal end of left radius with routine healing, subsequent encounter Yes   Continue as he is doing.  Return in one week.  X-rays then.  Call if any problem.  Precautions discussed.   Electronically Signed Sanjuana Kava, MD 1/8/20202:56 PM

## 2018-12-14 ENCOUNTER — Ambulatory Visit: Payer: Medicaid Other | Admitting: Orthopaedic Surgery

## 2018-12-20 ENCOUNTER — Ambulatory Visit (INDEPENDENT_AMBULATORY_CARE_PROVIDER_SITE_OTHER): Payer: Medicare Other

## 2018-12-20 ENCOUNTER — Ambulatory Visit (INDEPENDENT_AMBULATORY_CARE_PROVIDER_SITE_OTHER): Admitting: Orthopaedic Surgery

## 2018-12-20 ENCOUNTER — Encounter: Payer: Self-pay | Admitting: Orthopaedic Surgery

## 2018-12-20 VITALS — BP 111/76 | HR 119 | Ht 72.0 in | Wt 218.0 lb

## 2018-12-20 DIAGNOSIS — S52592D Other fractures of lower end of left radius, subsequent encounter for closed fracture with routine healing: Secondary | ICD-10-CM

## 2018-12-20 NOTE — Progress Notes (Signed)
CC:  My wrist does not hurt much  He has been using the cock-up splint and doing well with the left wrist.  NV intact.  X-rays were done of the left wrist, reported separately.  Encounter Diagnosis  Name Primary?  . Other closed fracture of distal end of left radius with routine healing, subsequent encounter Yes   Return in two weeks.  X-rays on return.  Call if any problem.  Precautions discussed.   Electronically Signed Sanjuana Kava, MD 1/15/20202:34 PM

## 2019-01-03 ENCOUNTER — Ambulatory Visit: Admitting: Orthopaedic Surgery

## 2019-01-05 ENCOUNTER — Other Ambulatory Visit (HOSPITAL_COMMUNITY): Payer: Self-pay | Admitting: *Deleted

## 2019-01-05 DIAGNOSIS — C3491 Malignant neoplasm of unspecified part of right bronchus or lung: Secondary | ICD-10-CM

## 2019-01-05 MED ORDER — PROCHLORPERAZINE MALEATE 10 MG PO TABS: 10.0000 mg | ORAL_TABLET | Freq: Four times a day (QID) | ORAL | 3 refills | Status: AC | PRN

## 2019-01-05 MED ORDER — LOPERAMIDE HCL 2 MG PO CAPS: 2.0000 mg | ORAL_CAPSULE | ORAL | 2 refills | Status: AC | PRN

## 2019-01-09 ENCOUNTER — Ambulatory Visit (INDEPENDENT_AMBULATORY_CARE_PROVIDER_SITE_OTHER): Payer: Medicare Other

## 2019-01-09 ENCOUNTER — Encounter: Payer: Self-pay | Admitting: Orthopaedic Surgery

## 2019-01-09 ENCOUNTER — Ambulatory Visit (INDEPENDENT_AMBULATORY_CARE_PROVIDER_SITE_OTHER): Admitting: Orthopaedic Surgery

## 2019-01-09 DIAGNOSIS — S52592D Other fractures of lower end of left radius, subsequent encounter for closed fracture with routine healing: Secondary | ICD-10-CM | POA: Diagnosis not present

## 2019-01-09 NOTE — Progress Notes (Signed)
CC:  My wrist does not hurt  He has done well with the left wrist in the cock-up splint.  He has no pain.  NV intact. ROM is good.  X-rays were done of the left wrist today, reported separately.  Encounter Diagnosis  Name Primary?  . Other closed fracture of distal end of left radius with routine healing, subsequent encounter Yes   Continue the splint.  Return in three weeks.  X-rays then.  Call if any problem.  Precautions discussed.   Electronically Signed Sanjuana Kava, MD 2/4/20202:26 PM

## 2019-01-30 ENCOUNTER — Ambulatory Visit: Admitting: Orthopaedic Surgery

## 2019-02-04 DEATH — deceased
